# Patient Record
Sex: Female | Born: 1955 | Race: Black or African American | Hispanic: No | Smoking: Current every day smoker
Health system: Southern US, Community
[De-identification: ages and names within clinical notes are randomized; demographics above are authoritative.]

## PROBLEM LIST (undated history)

## (undated) DIAGNOSIS — I10 Essential (primary) hypertension: Secondary | ICD-10-CM

## (undated) DIAGNOSIS — M797 Fibromyalgia: Secondary | ICD-10-CM

## (undated) DIAGNOSIS — J45909 Unspecified asthma, uncomplicated: Secondary | ICD-10-CM

## (undated) DIAGNOSIS — F319 Bipolar disorder, unspecified: Secondary | ICD-10-CM

## (undated) DIAGNOSIS — Z720 Tobacco use: Secondary | ICD-10-CM

## (undated) DIAGNOSIS — F209 Schizophrenia, unspecified: Secondary | ICD-10-CM

## (undated) HISTORY — DX: Bipolar disorder, unspecified: F31.9

## (undated) HISTORY — DX: Unspecified asthma, uncomplicated: J45.909

## (undated) HISTORY — DX: Tobacco use: Z72.0

## (undated) HISTORY — PX: TUBAL LIGATION: SHX77

---

## 1997-07-29 ENCOUNTER — Inpatient Hospital Stay (HOSPITAL_COMMUNITY): Admission: AD | Admit: 1997-07-29 | Discharge: 1997-08-02 | Payer: Self-pay | Admitting: Psychiatry

## 1998-09-11 ENCOUNTER — Ambulatory Visit (HOSPITAL_COMMUNITY): Admission: RE | Admit: 1998-09-11 | Discharge: 1998-09-11 | Payer: Self-pay | Admitting: Obstetrics

## 1998-11-24 ENCOUNTER — Inpatient Hospital Stay (HOSPITAL_COMMUNITY): Admission: AD | Admit: 1998-11-24 | Discharge: 1998-11-24 | Payer: Self-pay | Admitting: *Deleted

## 1998-12-20 ENCOUNTER — Inpatient Hospital Stay (HOSPITAL_COMMUNITY): Admission: AD | Admit: 1998-12-20 | Discharge: 1998-12-24 | Payer: Self-pay | Admitting: *Deleted

## 1999-06-27 ENCOUNTER — Emergency Department (HOSPITAL_COMMUNITY): Admission: EM | Admit: 1999-06-27 | Discharge: 1999-06-27 | Payer: Self-pay | Admitting: Emergency Medicine

## 1999-10-03 ENCOUNTER — Inpatient Hospital Stay (HOSPITAL_COMMUNITY): Admission: AD | Admit: 1999-10-03 | Discharge: 1999-10-06 | Payer: Self-pay

## 2000-03-26 ENCOUNTER — Emergency Department (HOSPITAL_COMMUNITY): Admission: EM | Admit: 2000-03-26 | Discharge: 2000-03-26 | Payer: Self-pay | Admitting: Emergency Medicine

## 2000-04-23 ENCOUNTER — Encounter: Admission: RE | Admit: 2000-04-23 | Discharge: 2000-04-23 | Payer: Self-pay | Admitting: Otolaryngology

## 2000-04-23 ENCOUNTER — Encounter: Payer: Self-pay | Admitting: Otolaryngology

## 2000-10-19 ENCOUNTER — Inpatient Hospital Stay (HOSPITAL_COMMUNITY): Admission: EM | Admit: 2000-10-19 | Discharge: 2000-10-19 | Payer: Self-pay | Admitting: *Deleted

## 2001-08-13 ENCOUNTER — Emergency Department (HOSPITAL_COMMUNITY): Admission: EM | Admit: 2001-08-13 | Discharge: 2001-08-13 | Payer: Self-pay | Admitting: *Deleted

## 2001-09-06 ENCOUNTER — Other Ambulatory Visit: Admission: RE | Admit: 2001-09-06 | Discharge: 2001-09-06 | Payer: Self-pay | Admitting: Family Medicine

## 2001-10-15 ENCOUNTER — Ambulatory Visit (HOSPITAL_COMMUNITY): Admission: RE | Admit: 2001-10-15 | Discharge: 2001-10-15 | Payer: Self-pay | Admitting: Family Medicine

## 2001-10-15 ENCOUNTER — Encounter: Payer: Self-pay | Admitting: Family Medicine

## 2001-11-23 ENCOUNTER — Emergency Department (HOSPITAL_COMMUNITY): Admission: EM | Admit: 2001-11-23 | Discharge: 2001-11-23 | Payer: Self-pay | Admitting: Emergency Medicine

## 2001-11-23 ENCOUNTER — Encounter: Payer: Self-pay | Admitting: Emergency Medicine

## 2004-05-23 ENCOUNTER — Ambulatory Visit: Payer: Self-pay | Admitting: Internal Medicine

## 2005-01-31 ENCOUNTER — Ambulatory Visit: Payer: Self-pay | Admitting: Family Medicine

## 2005-02-04 ENCOUNTER — Ambulatory Visit: Payer: Self-pay | Admitting: Family Medicine

## 2005-02-17 ENCOUNTER — Ambulatory Visit: Payer: Self-pay | Admitting: Family Medicine

## 2005-02-19 ENCOUNTER — Ambulatory Visit (HOSPITAL_COMMUNITY): Admission: RE | Admit: 2005-02-19 | Discharge: 2005-02-19 | Payer: Self-pay | Admitting: Family Medicine

## 2005-05-09 ENCOUNTER — Ambulatory Visit (HOSPITAL_COMMUNITY): Admission: RE | Admit: 2005-05-09 | Discharge: 2005-05-09 | Payer: Self-pay | Admitting: Obstetrics & Gynecology

## 2005-06-19 ENCOUNTER — Emergency Department (HOSPITAL_COMMUNITY): Admission: EM | Admit: 2005-06-19 | Discharge: 2005-06-19 | Payer: Self-pay | Admitting: Emergency Medicine

## 2005-12-08 ENCOUNTER — Ambulatory Visit: Payer: Self-pay | Admitting: Family Medicine

## 2006-03-03 ENCOUNTER — Ambulatory Visit (HOSPITAL_COMMUNITY): Admission: RE | Admit: 2006-03-03 | Discharge: 2006-03-03 | Payer: Self-pay | Admitting: Family Medicine

## 2006-04-06 ENCOUNTER — Encounter: Admission: RE | Admit: 2006-04-06 | Discharge: 2006-04-06 | Payer: Self-pay | Admitting: Family Medicine

## 2006-09-23 ENCOUNTER — Emergency Department (HOSPITAL_COMMUNITY): Admission: EM | Admit: 2006-09-23 | Discharge: 2006-09-23 | Payer: Self-pay | Admitting: Family Medicine

## 2006-11-10 ENCOUNTER — Emergency Department (HOSPITAL_COMMUNITY): Admission: EM | Admit: 2006-11-10 | Discharge: 2006-11-10 | Payer: Self-pay | Admitting: Emergency Medicine

## 2006-12-01 ENCOUNTER — Ambulatory Visit: Payer: Self-pay | Admitting: Internal Medicine

## 2007-01-06 ENCOUNTER — Ambulatory Visit: Payer: Self-pay | Admitting: Internal Medicine

## 2007-01-28 ENCOUNTER — Ambulatory Visit: Payer: Self-pay | Admitting: Internal Medicine

## 2007-03-02 ENCOUNTER — Ambulatory Visit: Payer: Self-pay | Admitting: Internal Medicine

## 2007-03-02 ENCOUNTER — Encounter (INDEPENDENT_AMBULATORY_CARE_PROVIDER_SITE_OTHER): Payer: Self-pay | Admitting: Family Medicine

## 2007-03-02 LAB — CONVERTED CEMR LAB
ALT: 69 units/L — ABNORMAL HIGH (ref 0–35)
AST: 50 units/L — ABNORMAL HIGH (ref 0–37)
Albumin: 3.9 g/dL (ref 3.5–5.2)
Alkaline Phosphatase: 52 units/L (ref 39–117)
BUN: 6 mg/dL (ref 6–23)
CO2: 22 meq/L (ref 19–32)
Calcium: 9.6 mg/dL (ref 8.4–10.5)
Chloride: 103 meq/L (ref 96–112)
Creatinine, Ser: 0.75 mg/dL (ref 0.40–1.20)
Glucose, Bld: 91 mg/dL (ref 70–99)
Lithium Lvl: 0.41 meq/L — ABNORMAL LOW (ref 0.80–1.40)
Potassium: 4.7 meq/L (ref 3.5–5.3)
Sodium: 137 meq/L (ref 135–145)
TSH: 0.409 microintl units/mL (ref 0.350–5.50)
Total Bilirubin: 0.4 mg/dL (ref 0.3–1.2)
Total Protein: 7.7 g/dL (ref 6.0–8.3)

## 2007-03-09 ENCOUNTER — Ambulatory Visit (HOSPITAL_COMMUNITY): Admission: RE | Admit: 2007-03-09 | Discharge: 2007-03-09 | Payer: Self-pay | Admitting: Family Medicine

## 2007-03-15 ENCOUNTER — Ambulatory Visit: Payer: Self-pay | Admitting: Internal Medicine

## 2007-09-24 ENCOUNTER — Emergency Department (HOSPITAL_COMMUNITY): Admission: EM | Admit: 2007-09-24 | Discharge: 2007-09-24 | Payer: Self-pay | Admitting: Emergency Medicine

## 2007-10-14 ENCOUNTER — Ambulatory Visit: Payer: Self-pay | Admitting: Internal Medicine

## 2007-10-15 ENCOUNTER — Encounter: Payer: Self-pay | Admitting: Family Medicine

## 2008-01-09 ENCOUNTER — Inpatient Hospital Stay (HOSPITAL_COMMUNITY): Admission: AD | Admit: 2008-01-09 | Discharge: 2008-01-09 | Payer: Self-pay | Admitting: Obstetrics and Gynecology

## 2008-01-13 ENCOUNTER — Encounter: Payer: Self-pay | Admitting: Family Medicine

## 2008-01-13 LAB — CONVERTED CEMR LAB
ALT: 26 units/L (ref 0–35)
AST: 25 units/L (ref 0–37)
Albumin: 4 g/dL (ref 3.5–5.2)
Alkaline Phosphatase: 58 units/L (ref 39–117)
BUN: 14 mg/dL (ref 6–23)
CO2: 22 meq/L (ref 19–32)
Calcium: 10.5 mg/dL (ref 8.4–10.5)
Chloride: 108 meq/L (ref 96–112)
Cholesterol: 185 mg/dL (ref 0–200)
Creatinine, Ser: 0.97 mg/dL (ref 0.40–1.20)
Glucose, Bld: 81 mg/dL (ref 70–99)
HDL: 52 mg/dL (ref >39)
LDL Cholesterol: 107 mg/dL — ABNORMAL HIGH (ref 0–99)
Lithium Lvl: 0.43 meq/L — ABNORMAL LOW (ref 0.80–1.40)
Potassium: 4.5 meq/L (ref 3.5–5.3)
Sodium: 138 meq/L (ref 135–145)
TSH: 1.043 microintl units/mL (ref 0.350–4.50)
Total Bilirubin: 0.2 mg/dL — ABNORMAL LOW (ref 0.3–1.2)
Total CHOL/HDL Ratio: 3.6
Total Protein: 7.5 g/dL (ref 6.0–8.3)
Triglycerides: 132 mg/dL (ref <150)
VLDL: 26 mg/dL (ref 0–40)

## 2008-01-25 ENCOUNTER — Encounter: Payer: Self-pay | Admitting: Family Medicine

## 2008-01-25 ENCOUNTER — Ambulatory Visit: Payer: Self-pay | Admitting: Internal Medicine

## 2008-01-25 LAB — CONVERTED CEMR LAB
ALT: 23 units/L (ref 0–35)
AST: 23 units/L (ref 0–37)
Albumin: 4.1 g/dL (ref 3.5–5.2)
Alkaline Phosphatase: 54 units/L (ref 39–117)
Amphetamine Screen, Ur: NEGATIVE
BUN: 9 mg/dL (ref 6–23)
Barbiturate Quant, Ur: NEGATIVE
Basophils Absolute: 0 10*3/uL (ref 0.0–0.1)
Basophils Relative: 0 % (ref 0–1)
Benzodiazepines.: NEGATIVE
CO2: 22 meq/L (ref 19–32)
Calcium: 9.7 mg/dL (ref 8.4–10.5)
Chlamydia, Swab/Urine, PCR: NEGATIVE
Chloride: 105 meq/L (ref 96–112)
Cocaine Metabolites: POSITIVE — AB
Creatinine, Ser: 0.93 mg/dL (ref 0.40–1.20)
Creatinine,U: 44.5 mg/dL
Eosinophils Absolute: 0.8 10*3/uL — ABNORMAL HIGH (ref 0.0–0.7)
Eosinophils Relative: 9 % — ABNORMAL HIGH (ref 0–5)
Folate: 10 ng/mL
GC Probe Amp, Urine: NEGATIVE
Glucose, Bld: 85 mg/dL (ref 70–99)
HCT: 33.5 % — ABNORMAL LOW (ref 36.0–46.0)
Hemoglobin: 10 g/dL — ABNORMAL LOW (ref 12.0–15.0)
Lymphocytes Relative: 19 % (ref 12–46)
Lymphs Abs: 1.7 10*3/uL (ref 0.7–4.0)
MCHC: 29.9 g/dL — ABNORMAL LOW (ref 30.0–36.0)
MCV: 79 fL (ref 78.0–100.0)
Marijuana Metabolite: NEGATIVE
Methadone: NEGATIVE
Monocytes Absolute: 0.5 10*3/uL (ref 0.1–1.0)
Monocytes Relative: 6 % (ref 3–12)
Neutro Abs: 5.6 10*3/uL (ref 1.7–7.7)
Neutrophils Relative %: 65 % (ref 43–77)
Opiate Screen, Urine: NEGATIVE
Phencyclidine (PCP): NEGATIVE
Platelets: 418 10*3/uL — ABNORMAL HIGH (ref 150–400)
Potassium: 4.7 meq/L (ref 3.5–5.3)
Propoxyphene: NEGATIVE
RBC: 4.24 M/uL (ref 3.87–5.11)
RDW: 17.3 % — ABNORMAL HIGH (ref 11.5–15.5)
Sodium: 135 meq/L (ref 135–145)
TSH: 1.61 microintl units/mL (ref 0.350–4.50)
Total Bilirubin: 0.3 mg/dL (ref 0.3–1.2)
Total Protein: 8 g/dL (ref 6.0–8.3)
Vit D, 1,25-Dihydroxy: 29 — ABNORMAL LOW (ref 30–89)
Vitamin B-12: 628 pg/mL (ref 211–911)
WBC: 8.7 10*3/uL (ref 4.0–10.5)

## 2008-03-15 ENCOUNTER — Ambulatory Visit: Payer: Self-pay | Admitting: Internal Medicine

## 2008-06-13 ENCOUNTER — Ambulatory Visit: Payer: Self-pay | Admitting: Internal Medicine

## 2008-06-13 ENCOUNTER — Encounter (INDEPENDENT_AMBULATORY_CARE_PROVIDER_SITE_OTHER): Payer: Self-pay | Admitting: Adult Health

## 2008-06-13 LAB — CONVERTED CEMR LAB
ALT: 32 units/L (ref 0–35)
AST: 26 units/L (ref 0–37)
Albumin: 4.3 g/dL (ref 3.5–5.2)
Alkaline Phosphatase: 56 units/L (ref 39–117)
BUN: 12 mg/dL (ref 6–23)
Basophils Absolute: 0 10*3/uL (ref 0.0–0.1)
Basophils Relative: 1 % (ref 0–1)
CO2: 23 meq/L (ref 19–32)
Calcium: 9.4 mg/dL (ref 8.4–10.5)
Chlamydia, DNA Probe: NEGATIVE
Chloride: 105 meq/L (ref 96–112)
Cholesterol: 189 mg/dL (ref 0–200)
Creatinine, Ser: 0.84 mg/dL (ref 0.40–1.20)
Eosinophils Absolute: 0.4 10*3/uL (ref 0.0–0.7)
Eosinophils Relative: 7 % — ABNORMAL HIGH (ref 0–5)
GC Probe Amp, Genital: NEGATIVE
Glucose, Bld: 92 mg/dL (ref 70–99)
HCT: 40.9 % (ref 36.0–46.0)
HDL: 64 mg/dL (ref >39)
Hemoglobin: 13 g/dL (ref 12.0–15.0)
LDL Cholesterol: 94 mg/dL (ref 0–99)
Lymphocytes Relative: 43 % (ref 12–46)
Lymphs Abs: 2.8 10*3/uL (ref 0.7–4.0)
MCHC: 31.8 g/dL (ref 30.0–36.0)
MCV: 69.1 fL — ABNORMAL LOW (ref 78.0–100.0)
Monocytes Absolute: 0.5 10*3/uL (ref 0.1–1.0)
Monocytes Relative: 8 % (ref 3–12)
Neutro Abs: 2.7 10*3/uL (ref 1.7–7.7)
Neutrophils Relative %: 42 % — ABNORMAL LOW (ref 43–77)
Platelets: 386 10*3/uL (ref 150–400)
Potassium: 4.2 meq/L (ref 3.5–5.3)
RBC: 5.92 M/uL — ABNORMAL HIGH (ref 3.87–5.11)
RDW: 17.6 % — ABNORMAL HIGH (ref 11.5–15.5)
Sodium: 139 meq/L (ref 135–145)
Total Bilirubin: 0.3 mg/dL (ref 0.3–1.2)
Total CHOL/HDL Ratio: 3
Total Protein: 8.2 g/dL (ref 6.0–8.3)
Triglycerides: 156 mg/dL — ABNORMAL HIGH (ref <150)
VLDL: 31 mg/dL (ref 0–40)
WBC: 6.4 10*3/uL (ref 4.0–10.5)

## 2008-06-15 ENCOUNTER — Encounter (INDEPENDENT_AMBULATORY_CARE_PROVIDER_SITE_OTHER): Payer: Self-pay | Admitting: Adult Health

## 2008-06-15 LAB — CONVERTED CEMR LAB: Lithium Lvl: 0.15 meq/L — ABNORMAL LOW (ref 0.80–1.40)

## 2009-10-04 ENCOUNTER — Emergency Department (HOSPITAL_COMMUNITY): Admission: EM | Admit: 2009-10-04 | Discharge: 2009-10-04 | Payer: Self-pay | Admitting: Emergency Medicine

## 2009-10-12 ENCOUNTER — Emergency Department (HOSPITAL_COMMUNITY): Admission: EM | Admit: 2009-10-12 | Discharge: 2009-10-12 | Payer: Self-pay | Admitting: Emergency Medicine

## 2009-10-14 ENCOUNTER — Emergency Department (HOSPITAL_COMMUNITY): Admission: EM | Admit: 2009-10-14 | Discharge: 2009-10-14 | Payer: Self-pay | Admitting: Emergency Medicine

## 2009-10-28 ENCOUNTER — Emergency Department (HOSPITAL_COMMUNITY): Admission: EM | Admit: 2009-10-28 | Discharge: 2009-10-28 | Payer: Self-pay | Admitting: Emergency Medicine

## 2010-04-29 ENCOUNTER — Emergency Department (HOSPITAL_COMMUNITY)
Admission: EM | Admit: 2010-04-29 | Discharge: 2010-04-29 | Payer: Self-pay | Source: Home / Self Care | Admitting: Emergency Medicine

## 2010-07-06 ENCOUNTER — Emergency Department (HOSPITAL_COMMUNITY)
Admission: EM | Admit: 2010-07-06 | Discharge: 2010-07-07 | Disposition: A | Discharge status: Other Acute Inpatient Hospital | Payer: Medicaid Other | Source: Home / Self Care | Attending: Emergency Medicine | Admitting: Emergency Medicine

## 2010-07-06 DIAGNOSIS — F3110 Bipolar disorder, current episode manic without psychotic features, unspecified: Secondary | ICD-10-CM | POA: Insufficient documentation

## 2010-07-06 DIAGNOSIS — Z794 Long term (current) use of insulin: Secondary | ICD-10-CM | POA: Insufficient documentation

## 2010-07-06 LAB — URINALYSIS, ROUTINE W REFLEX MICROSCOPIC
Bilirubin Urine: NEGATIVE
Glucose, UA: NEGATIVE mg/dL
Hgb urine dipstick: NEGATIVE
Ketones, ur: NEGATIVE mg/dL
Nitrite: NEGATIVE
Protein, ur: NEGATIVE mg/dL
Specific Gravity, Urine: 1.012 (ref 1.005–1.030)
Urobilinogen, UA: 0.2 mg/dL (ref 0.0–1.0)
pH: 6.5 (ref 5.0–8.0)

## 2010-07-06 LAB — COMPREHENSIVE METABOLIC PANEL
ALT: 55 U/L — ABNORMAL HIGH (ref 0–35)
AST: 39 U/L — ABNORMAL HIGH (ref 0–37)
Albumin: 4.1 g/dL (ref 3.5–5.2)
Alkaline Phosphatase: 61 U/L (ref 39–117)
BUN: 8 mg/dL (ref 6–23)
CO2: 27 mEq/L (ref 19–32)
Calcium: 9.6 mg/dL (ref 8.4–10.5)
Chloride: 105 mEq/L (ref 96–112)
Creatinine, Ser: 0.73 mg/dL (ref 0.4–1.2)
GFR calc Af Amer: >60 mL/min (ref >60)
GFR calc non Af Amer: >60 mL/min (ref >60)
Glucose, Bld: 61 mg/dL — ABNORMAL LOW (ref 70–99)
Potassium: 4 mEq/L (ref 3.5–5.1)
Sodium: 137 mEq/L (ref 135–145)
Total Bilirubin: 0.4 mg/dL (ref 0.3–1.2)
Total Protein: 8.4 g/dL — ABNORMAL HIGH (ref 6.0–8.3)

## 2010-07-06 LAB — RAPID URINE DRUG SCREEN, HOSP PERFORMED
Amphetamines: NOT DETECTED
Barbiturates: NOT DETECTED
Benzodiazepines: NOT DETECTED
Cocaine: NOT DETECTED
Opiates: NOT DETECTED
Tetrahydrocannabinol: NOT DETECTED

## 2010-07-06 LAB — DIFFERENTIAL
Basophils Absolute: 0 10*3/uL (ref 0.0–0.1)
Basophils Relative: 1 % (ref 0–1)
Eosinophils Absolute: 0.2 10*3/uL (ref 0.0–0.7)
Eosinophils Relative: 4 % (ref 0–5)
Lymphocytes Relative: 38 % (ref 12–46)
Lymphs Abs: 2.2 10*3/uL (ref 0.7–4.0)
Monocytes Absolute: 0.4 10*3/uL (ref 0.1–1.0)
Monocytes Relative: 7 % (ref 3–12)
Neutro Abs: 3 10*3/uL (ref 1.7–7.7)
Neutrophils Relative %: 51 % (ref 43–77)

## 2010-07-06 LAB — CBC
HCT: 42.9 % (ref 36.0–46.0)
Hemoglobin: 13.4 g/dL (ref 12.0–15.0)
MCH: 23.6 pg — ABNORMAL LOW (ref 26.0–34.0)
MCHC: 31.2 g/dL (ref 30.0–36.0)
MCV: 75.7 fL — ABNORMAL LOW (ref 78.0–100.0)
Platelets: 393 10*3/uL (ref 150–400)
RBC: 5.67 MIL/uL — ABNORMAL HIGH (ref 3.87–5.11)
RDW: 16 % — ABNORMAL HIGH (ref 11.5–15.5)
WBC: 5.9 10*3/uL (ref 4.0–10.5)

## 2010-07-06 LAB — POCT PREGNANCY, URINE: Preg Test, Ur: NEGATIVE

## 2010-07-06 LAB — LITHIUM LEVEL: Lithium Lvl: 0.59 mEq/L — ABNORMAL LOW (ref 0.80–1.40)

## 2010-07-07 ENCOUNTER — Inpatient Hospital Stay (HOSPITAL_COMMUNITY)
Admission: RE | Admit: 2010-07-07 | Discharge: 2010-07-16 | DRG: 885 | Disposition: A | Discharge status: Home / Self Care | Payer: Medicaid Other | Source: Ambulatory Visit | Attending: Psychiatry | Admitting: Psychiatry

## 2010-07-07 DIAGNOSIS — Z560 Unemployment, unspecified: Secondary | ICD-10-CM

## 2010-07-07 DIAGNOSIS — F1420 Cocaine dependence, uncomplicated: Secondary | ICD-10-CM

## 2010-07-07 DIAGNOSIS — F259 Schizoaffective disorder, unspecified: Principal | ICD-10-CM

## 2010-07-07 DIAGNOSIS — R7402 Elevation of levels of lactic acid dehydrogenase (LDH): Secondary | ICD-10-CM

## 2010-07-07 DIAGNOSIS — Z818 Family history of other mental and behavioral disorders: Secondary | ICD-10-CM

## 2010-07-07 DIAGNOSIS — Z880 Allergy status to penicillin: Secondary | ICD-10-CM

## 2010-07-07 DIAGNOSIS — R7401 Elevation of levels of liver transaminase levels: Secondary | ICD-10-CM

## 2010-07-07 DIAGNOSIS — F312 Bipolar disorder, current episode manic severe with psychotic features: Secondary | ICD-10-CM

## 2010-07-08 DIAGNOSIS — F3110 Bipolar disorder, current episode manic without psychotic features, unspecified: Secondary | ICD-10-CM

## 2010-07-08 DIAGNOSIS — F1420 Cocaine dependence, uncomplicated: Secondary | ICD-10-CM

## 2010-07-08 DIAGNOSIS — F259 Schizoaffective disorder, unspecified: Secondary | ICD-10-CM

## 2010-07-08 LAB — GC/CHLAMYDIA PROBE AMP, GENITAL
Chlamydia, DNA Probe: NEGATIVE
GC Probe Amp, Genital: NEGATIVE

## 2010-07-08 LAB — URINALYSIS, ROUTINE W REFLEX MICROSCOPIC
Bilirubin Urine: NEGATIVE
Glucose, UA: NEGATIVE mg/dL
Hgb urine dipstick: NEGATIVE
Ketones, ur: NEGATIVE mg/dL
Nitrite: NEGATIVE
Protein, ur: NEGATIVE mg/dL
Specific Gravity, Urine: 1.012 (ref 1.005–1.030)
Urobilinogen, UA: 1 mg/dL (ref 0.0–1.0)
pH: 7 (ref 5.0–8.0)

## 2010-07-08 LAB — URINE MICROSCOPIC-ADD ON

## 2010-07-08 LAB — WET PREP, GENITAL
Trich, Wet Prep: NONE SEEN
Yeast Wet Prep HPF POC: NONE SEEN

## 2010-07-09 NOTE — H&P (Signed)
Karen Best, Karen Best             ACCOUNT NO.:  [id number]  MEDICAL RECORD NO.:  [id number]           PATIENT TYPE:  LOCATION:  0406                          FACILITY:  [facility]  PHYSICIAN:  Eulogio Ditch, MD DATE OF BIRTH:  [birth year]  DATE OF ADMISSION:  07/07/2010 DATE OF DISCHARGE:                      PSYCHIATRIC ADMISSION ASSESSMENT   TIME:  1405 p.m.  IDENTIFYING INFORMATION:  A 55 year old [place] American female, single. This is an involuntary admission.  HISTORY OF PRESENT ILLNESS:  This is the second [facility] admission for Karen Best who presents by way of the emergency room where her friend brought her  because of increasing motor activity for the previous 3 weeks.  She had complete dental extraction about 3 weeks prior to admission.  Since that time, her sleep had been poor.  She had a lot of increased energy, increased motor activity, singing and laughing inappropriately, and generally appearing to be in a manic state.  Today, Karen Best is directable, does appear somewhat internally distracted with decreased concentration, easily directable.  Reports she has a history of cocaine use and has been abstinent for about 2 months now.  She does endorse having a lot of increased energy.  Not sleeping and not eating much.  She does report medication compliance.  PAST PSYCHIATRIC HISTORY:  Previous [facility] admission in 2001.  At that time was agitated and hypomanic with homicidal thoughts.  Stabilized at that time on Zyprexa and lithium.  She has a history of previous admissions to [facility] in 1992 and [facility] in 1999.  She reports a history of mental illness when she was about 76 or 81 years of old.  History of cocaine abuse for the last 15-20 years with episodes of abstinence up to 6 or 8 months at a time.  SOCIAL HISTORY:  This is a single [place] American female who reports that she has been living with a friend for several years.  Is  currently involved at [employer].  Says she has a home that she can return to.  Denies any legal charges.  She is unemployed.  FAMILY HISTORY:  Brother with schizophrenia.  PRIMARY CARE PROVIDER:  The primary care provider and dental provider are unclear.  MEDICAL PROBLEMS: 1. Bipolar disorder versus schizoaffective disorder. 2. History of cocaine abuse. 3. Recent dental extraction.  PAST MEDICAL HISTORY: 1. C. section x1. 2. Recent dental extraction.  She is edentulous.  CURRENT MEDICATIONS:  Obtained at [facility] in [place], [place].  Lithium 300 mg CR one in the morning and two at bedtime.  DRUG ALLERGIES:  PENICILLIN.  PHYSICAL EXAMINATION:  GENERAL:  Physical exam was done in the emergency room.  This is a slight built female in no acute physical distress, 5 feet 3 inches tall, 54 kg. VITAL SIGNS:  Today temperature 98.3, pulse 76, respirations 18, blood pressure 106/72.  LABORATORY DATA:  Lithium level was 0.59.  Urine drug screen negative. Urinalysis unremarkable.  Metabolic panel:  Sodium 137, potassium 4.0, chloride 105, carbon dioxide 27, BUN 8, creatinine 0.73, random glucose 61.  Liver enzymes:  SGOT 39, SGPT 55, alkaline phosphatase 61, total bilirubin 0.4.  CBC:  WBC 5.9, hemoglobin 13.4, hematocrit 42.9, platelets 393,000, MCV is 75.7.  MENTAL STATUS EXAM:  Fully alert female, responds quickly to her name, fairly bright affect.  She is appropriately dressed, well groomed, up and about in the day room.  Quite active in the halls, doing some pacing back and forth to her room, but is easily directable, polite.  Speech is mildly pressured.  Her associations are a little bit loose, but her conversation is appropriate.  Thinking generally goal directed, somewhat tangential at times.  No delusional statements made.  Readily admits that she has a lot of extra energy and has not been sleeping much. Think she may have lost some weight,  but is not clear about how much. Staring off into space from time to time.  Speech is quite dysarthric and difficult to understand, but subjectively she denies any history of EPS and insists that this is because of her recent dental extraction. Oriented to person, place and situation.  AXIS I:  Rule out schizoaffective disorder versus bipolar disorder manic, cocaine abuse and dependence. AXIS II:  Deferred. AXIS III:  Post dental extraction, mildly elevated transaminases of unknown etiology. AXIS IV:  Deferred. AXIS V:  Current is 40, past year not known.  PLAN:  The plan is to involuntarily admit her for stabilization.  We have placed her on nutritional supplements four times daily.  She says that she has a safe home to return to.  We are continuing her lithium at 300 mg q.a.m. and 600 mg nightly.  We will check a lithium level on July 11, 2010.  Risperdal 1 mg p.o. b.i.d.     Young Berry. Lorin Picket, N.P.   ______________________________ Eulogio Ditch, MD    MAS/MEDQ  D:  07/08/2010  T:  07/08/2010  Job:  [id number]  Electronically Signed by Kari Baars N.P. on 07/08/2010 04:34:11 PM Electronically Signed by Eulogio Ditch  on 07/09/2010 05:36:02 AM

## 2010-07-11 LAB — BASIC METABOLIC PANEL
BUN: 12 mg/dL (ref 6–23)
CO2: 27 mEq/L (ref 19–32)
Calcium: 9.5 mg/dL (ref 8.4–10.5)
Chloride: 106 mEq/L (ref 96–112)
Creatinine, Ser: 0.69 mg/dL (ref 0.4–1.2)
GFR calc Af Amer: >60 mL/min (ref >60)
GFR calc non Af Amer: >60 mL/min (ref >60)
Glucose, Bld: 93 mg/dL (ref 70–99)
Potassium: 4.2 mEq/L (ref 3.5–5.1)
Sodium: 137 mEq/L (ref 135–145)

## 2010-07-11 LAB — RPR: RPR Ser Ql: NONREACTIVE

## 2010-07-11 LAB — LITHIUM LEVEL: Lithium Lvl: 0.5 mEq/L — ABNORMAL LOW (ref 0.80–1.40)

## 2010-07-13 LAB — TSH: TSH: 0.856 u[IU]/mL (ref 0.350–4.500)

## 2010-07-13 LAB — BASIC METABOLIC PANEL
BUN: 11 mg/dL (ref 6–23)
CO2: 29 mEq/L (ref 19–32)
Calcium: 9.8 mg/dL (ref 8.4–10.5)
Chloride: 102 mEq/L (ref 96–112)
Creatinine, Ser: 0.79 mg/dL (ref 0.4–1.2)
GFR calc Af Amer: >60 mL/min (ref >60)
GFR calc non Af Amer: >60 mL/min (ref >60)
Glucose, Bld: 109 mg/dL — ABNORMAL HIGH (ref 70–99)
Potassium: 3.6 mEq/L (ref 3.5–5.1)
Sodium: 137 mEq/L (ref 135–145)

## 2010-07-13 LAB — RPR: RPR Ser Ql: NONREACTIVE

## 2010-07-13 LAB — LITHIUM LEVEL: Lithium Lvl: 0.42 mEq/L — ABNORMAL LOW (ref 0.80–1.40)

## 2010-07-15 LAB — URINALYSIS, ROUTINE W REFLEX MICROSCOPIC
Bilirubin Urine: NEGATIVE
Glucose, UA: NEGATIVE mg/dL
Hgb urine dipstick: NEGATIVE
Ketones, ur: NEGATIVE mg/dL
Nitrite: NEGATIVE
Protein, ur: NEGATIVE mg/dL
Specific Gravity, Urine: 1.009 (ref 1.005–1.030)
Urobilinogen, UA: 0.2 mg/dL (ref 0.0–1.0)
pH: 7.5 (ref 5.0–8.0)

## 2010-07-15 LAB — DIFFERENTIAL
Basophils Absolute: 0 10*3/uL (ref 0.0–0.1)
Basophils Relative: 1 % (ref 0–1)
Eosinophils Absolute: 0.3 10*3/uL (ref 0.0–0.7)
Eosinophils Relative: 8 % — ABNORMAL HIGH (ref 0–5)
Lymphocytes Relative: 60 % — ABNORMAL HIGH (ref 12–46)
Lymphs Abs: 2 10*3/uL (ref 0.7–4.0)
Monocytes Absolute: 0.3 10*3/uL (ref 0.1–1.0)
Monocytes Relative: 8 % (ref 3–12)
Neutro Abs: 0.8 10*3/uL — ABNORMAL LOW (ref 1.7–7.7)
Neutrophils Relative %: 23 % — ABNORMAL LOW (ref 43–77)

## 2010-07-15 LAB — POCT I-STAT, CHEM 8
BUN: 7 mg/dL (ref 6–23)
Calcium, Ion: 1.22 mmol/L (ref 1.12–1.32)
Chloride: 106 mEq/L (ref 96–112)
Creatinine, Ser: 1 mg/dL (ref 0.4–1.2)
Glucose, Bld: 76 mg/dL (ref 70–99)
HCT: 44 % (ref 36.0–46.0)
Hemoglobin: 15 g/dL (ref 12.0–15.0)
Potassium: 4.4 mEq/L (ref 3.5–5.1)
Sodium: 141 mEq/L (ref 135–145)
TCO2: 29 mmol/L (ref 0–100)

## 2010-07-15 LAB — GC/CHLAMYDIA PROBE AMP, GENITAL
Chlamydia, DNA Probe: NEGATIVE
GC Probe Amp, Genital: NEGATIVE

## 2010-07-15 LAB — CBC
HCT: 40 % (ref 36.0–46.0)
Hemoglobin: 12.9 g/dL (ref 12.0–15.0)
MCHC: 32.3 g/dL (ref 30.0–36.0)
MCV: 74.3 fL — ABNORMAL LOW (ref 78.0–100.0)
Platelets: 227 10*3/uL (ref 150–400)
RBC: 5.39 MIL/uL — ABNORMAL HIGH (ref 3.87–5.11)
RDW: 14.7 % (ref 11.5–15.5)
WBC: 3.4 10*3/uL — ABNORMAL LOW (ref 4.0–10.5)

## 2010-07-15 LAB — CK: Total CK: 60 U/L (ref 7–177)

## 2010-07-15 LAB — WET PREP, GENITAL
Trich, Wet Prep: NONE SEEN
WBC, Wet Prep HPF POC: NONE SEEN
Yeast Wet Prep HPF POC: NONE SEEN

## 2010-07-15 LAB — URINE MICROSCOPIC-ADD ON

## 2010-07-17 LAB — GC/CHLAMYDIA PROBE AMP, URINE
Chlamydia, Swab/Urine, PCR: NEGATIVE
GC Probe Amp, Urine: NEGATIVE

## 2010-08-13 NOTE — Discharge Summary (Signed)
Karen Best, Karen Best             ACCOUNT NO.:  [id number]  MEDICAL RECORD NO.:  [id number]           PATIENT TYPE:  I  LOCATION:  0405                          FACILITY:  [facility]  PHYSICIAN:  Karen Ditch, MD DATE OF BIRTH:  Jul 26, 1955  DATE OF ADMISSION:  07/07/2010 DATE OF DISCHARGE:  07/16/2010                              DISCHARGE SUMMARY   IDENTIFYING INFORMATION:  This is a 56 year old African American female single.  This is an involuntary admission.  HISTORY OF PRESENT ILLNESS:  This was Karen second Karen Best admission for Karen Best.  Presented by way of our emergency room where her friend brought her because of increased motor activity for Karen previous 3 weeks.  She had recently had a complete dental extraction about 3 weeks prior to admission, and since that time her sleep had been poor, having a lot of increased energy, increased motor activity, singing and laughing inappropriately, and generally appeared to be having an exacerbation of her bipolar disorder with a manic state.  On initial presentation she appeared internally distracted with decreased concentration, poor impulse control, impaired judgment and insight, but was easily redirectable and voicing no acute suicidal thoughts.  She had not been sleeping or eating very much she reported prior to admission.  She had a history of a previous admission in 2001 also with agitation and hypomanic behavior.  She had a history of previous admissions to [facility] in [facility] and [facility] in  1999. She reported a history of mental illness since she was about 54 or 55 years of age.  She had previously abused cocaine for Karen last 15-20 years, but had been abstinent up to 8 months at a time very recently. She is an unemployed single female who had been living with a friend. Medical evaluation and diagnostic studies, her full physical exam was done in Karen emergency room.  She is in a slim-built,  African American female, 54 kg, 5 feet 3 inches tall, admitted in no physical distress. Admitting vital signs:  Temp 98.3, pulse 76, respirations 18, blood pressure 106/72.  Her admitting lithium level was noted to be 0.59. Urine drug screen negative for all substances.  Normal electrolytes. BUN 8, creatinine 0.73, random glucose 61.  Liver enzymes:  SGOT 39, SGPT 55, alkaline phosphatase 6,  and total bilirubin 0.4.  CBC normal with a hemoglobin of 13.4 normal platelets, MCV 75.7.  CHRONIC MEDICAL PROBLEMS: 1. Bipolar disorder. 2. Recent dental extraction, and she was noted to be edentulous.  COURSE OF HOSPITALIZATION:  She was admitted to our acute stabilization and Int[facility], and given an initial diagnosis of schizoaffective disorder versus bipolar disorder manic.  Cocaine dependence in remission.  Her behavior was appropriate while on [facility] Best, and she was gradually assimilated into Karen milieu.  We continued her involuntary petition for safety.  We placed her on nutritional supplements 4 times daily due to her hypomanic state, and continued her lithium at her home dose of 300 mg q.a.m. and 600 mg q.p.m.  We elected to add Risperdal 1 mg p.o. b.i.d.  We continued to work with Karen  Risperdal, and it was gradually titrated to 3 mg p.o. q.h.s. by March 13.  Repeat lithium level performed on March 15 was noted to be 0.50. By Karen 15th Karen Best was continuing to display some labile affect, tangential thinking, and having difficulty modulating Karen volume of her speech.  Her speech was somewhat dysarthric throughout her stay here which we felt was related to her being edentulous.  She had had her teeth just extracted recently, and complained of having a lot of difficulty articulating her words.  However, she was understandable.  By Karen 16th we elected to increase her lithium to 600 mg b.i.d. based on her previous level, and she tolerated this well.  We continued to titrate her  Risperdal to 1 mg q.a.m. and 4 mg q.h.s.  By Karen 19th she appeared significantly less disorganized.  No wandering.  Denied hearing any auditory hallucinations, less guarded, and much more appropriate.  By Karen 20th she was sleeping consistently at least 6 hours every night, fully alert, cooperative with a fairly bright affect.  She had plans to [facility] to [facility] and follow up there, and hope to get a bed at [facility].  She had previously said she had a home to return to, but thought better of returning to her previous friend that she had been living with feeling that it was an unhealthy situation and possibly risked her sobriety.  She was in full contact with reality, and voicing her intent to follow up with post discharge treatment plan.  FOLLOW-UP PLAN:  Follow up at [facility] on March 22 at 2:30 p.m.  It is noted that her final lithium level drawn was drawn on March 20 in Karen morning, and was pending at Karen time of discharge.  DISCHARGE MEDICATIONS: 1. Benadryl 50 mg q.h.s. 2. Lithium carbonate 600 mg twice daily with meals at 7 a.m. and 5     p.m. 3. Risperdal 1 mg q.a.m. and 4 mg q.h.s.  DISCHARGE DIAGNOSES: 1. Bipolar disorder manic phase, stabilized.  2.  Cocaine dependence     in remission. Axis III:  1.  Post dental extraction.  2.  Mildly elevated transaminases of unknown etiology. Axis IV:  Significant homelessness issues, stabilized. Axis V:  Current 58.  Past year 87 estimated.  DISCHARGE CONDITION:  Stable.     Karen Best, N.P.   ______________________________ Karen Ditch, MD    MAS/MEDQ  D:  08/12/2010  T:  08/12/2010  Job:  [id number]  Electronically Signed by Karen Best N.P. on 08/12/2010 01:56:06 PM Electronically Signed by Karen Best  on 08/13/2010 06:49:23 PM

## 2010-09-13 NOTE — H&P (Signed)
[facility]  Patient:    Karen Best, Karen Best                      MRN: [id number] Adm. Date:  [id number] Disc. Date: [id number] Attending:  Tobey Bride Dictator:   Johnella Moloney, NP                         History and Physical  IDENTIFYING INFORMATION:  Karen Best is a 55 year old married African-American female admitted October 03, 1999 on a voluntary commitment due to suicidal ideation, homicidal ideation towards the father of her child, and also at the time she was intoxicated with crack cocaine and THC.  HISTORY OF PRESENT ILLNESS:  Patient states that she is in the hospital because she felt like she would be better off in the hospital.  She has been having auditory hallucinations for two months.  The voices have been telling her she should not be seeing another than her husband, and sometimes they tell her to kill herself.  The voices told her to kill herself yesterday and she was trying to figure out a plan and how to do it.  She also is having visual hallucinations.  She sees people and sometimes it is fake and it scares her. She acknowledges being depressed for two months.  Sleep is good, appetite is good.  She has had a weight loss of 40 pounds in nine months, but she just had a daughter nine months ago.  She states that she has been using crack cocaine and THC.  She remains homicidal towards her childs boyfriend.  She states that if she had a gun that she would shoot him, and apparently she just found out he might get out of prison next month and she states that if he does that she will indeed shoot him.  She has had crying spells at times.  PAST PSYCHIATRIC HISTORY:  Patient apparently goes to [facility], sees Harolyn Rutherford.  She has been in therapy since 1992.  She has been hospitalized at [facility] in [facility] for "nerves," hospitalized [facility].  She was there for one month.  She states  she was there for lack of sleep x 16 days.  PAST MEDICAL HISTORY:  Patient goes to [facility] for her primary medical care.  Medical problems include a bladder infection, but she took two or three doses of Septra and her urinalysis has come back within normal limits.  CURRENT MEDICATIONS: 1. Zyprexa 10 mg at h.s. 2. Lithium 300 mg b.i.d. 3. Neurontin 4 mg three at bedtime. 4. Septra b.i.d., however she has not been taking this as prescribed.  DRUG ALLERGIES:  PENICILLIN.  SOCIAL HISTORY:  Patient has been married x 2.  The first marriage she states her husband was sick.  They had no children.  They eventually divorced.  She has been married for the second time for 17 years.  She has a 51 year old daughter from this marriage.  She apparently has been having an affair with b boyfriend since 1999.  Her husband is aware of this.  She states currently the boyfriend is in prison for two months because she has been trying to keep him away from her and she apparently charged him with stealing and he was put in prison.  She feels like trying to get out of that relationship has been a primary stressor for  her.  Her father is living.  She has four brothers and one sister.  Two brothers died, one of an MRI and one was murdered about 17 years ago.  She completed ninth grade.  She is unemployed, on [place]. Husband works in [employer] certified [employer].  She states there is the usual marital stresses.  She seems to minimize the fact that she has a boyfriend who she is having an affair with and also continues to live with her husband.  FAMILY HISTORY:  Her brother is just very schizophrenic.  ALCOHOL DRUG HISTORY:  She states she drinks alcohol occasionally.  She has been using cocaine for two years once a week; however, prior to her boyfriend going in to prison, she was using cocaine every day.  She states she recently started marijuana one month ago.  She has used marijuana, she said, for a total of  four time.  PHYSICAL EXAMINATION:  Please see physical examination done at [facility] emergency room on October 02, 1999.  Her urine drug screen was positive for crack, positive for THC, and positive for tricyclates.  According to her, she last used October 01, 1999.  Her lithium level and thyroid profile are pending.  CURRENT MENTAL STATUS EXAMINATION:  A disheveled African-American female dressed in hospital gown.  She is basically cooperative.  Speech:  She mumbles, it is hard to understand here.  Mood anxious.  Affect inappropriate. Positive for suicidal ideation.  Thought process positive for auditory and visual hallucinations with command hallucinations to kill herself.  Positive for suicidal ideation, positive for homicidal ideation.  Cognitive:  Alert and oriented.  Cognitive functioning appears to be intact.  She has poor judgment, poor impulse control, and poor insight.  CURRENT DIAGNOSIS: Axis I:    1. Psychotic disorder not otherwise specified.            2. THC abuse.            3. Cocaine dependence.            4. Rule out schizophrenia.            5. Rule out schizoaffective disorder. Axis II:   Deferred. Axis III:  None. Axis IV:   Severe, related to family problems and her substance abuse. Axis V:    Current global assessment of function 30, highest in past year 60.  CURRENT TREATMENT PLAN AND RECOMMENDATIONS:  Voluntary admission to [facility] unit.  Our goal will be to maintain safety, check every 15 minutes, and have her contract for safety.  For now, we will continue the Zyprexa 10 mg p.o. h.s., Neurontin 400 mg three p.o. at h.s., lithium 300 mg two b.i.d.  Will contact the [facility] to obtain her current diagnosis, her current medications and her past psychiatric treatment.  We will add Wellbutrin 100 mg XR one p.o. q.d. and she is to go to dual diagnosis group.  TENTATIVE LENGTH OF STAY AND DISCHARGE PLAN:  Three to  five days. DD:  10/03/99 TD:  10/04/99 Job: [id number]

## 2010-09-13 NOTE — Op Note (Signed)
Karen Best, Karen Best NO.:  [id number]   MEDICAL RECORD NO.:  [id number]          PATIENT TYPE:  AMB   LOCATION:  SDC                           FACILITY:  [facility]   PHYSICIAN:  Roseanna Rainbow, M.D.DATE OF BIRTH:  [birth year]   DATE OF PROCEDURE:  05/09/2005  DATE OF DISCHARGE:                                 OPERATIVE REPORT   PREOPERATIVE DIAGNOSIS:  Multiparity, a desires sterilization procedure.   POSTOPERATIVE DIAGNOSIS:  Multiparity, a desires sterilization procedure.   PROCEDURE:  Laparoscopic bilateral tubal ligation using fulguration.   SURGEON:  Roseanna Rainbow, M.D.   ANESTHESIA:  General tracheal.   ESTIMATED BLOOD LOSS:  Minimal.   COMPLICATIONS:  None.   PROCEDURE:  The patient was taken to the operating room.  She was given  general anesthesia.  She was placed in the dorsal lithotomy position and  prepped and draped in the usual sterile fashion.  A bivalve speculum was  placed in the patient's vagina.  The anterior lip of the cervix was grasped  with a single-tooth tenaculum.  A Hulka manipulator was then advanced into  the uterus and secured to the anterior lip of the cervix.  The single-tooth  tenaculum and speculum were then removed.  An infraumbilical skin incision  was then made with the scalpel and carried down to the fascia.  The fascia  was tented up and entered sharply.  The parietal peritoneum was tented up  and entered sharply as well.  Fascial sutures were then placed.  The Hasson  trocar and sleeve were then advanced into the abdomen.  The abdomen was  insufflated with CO2 gas.  A survey of the pelvic anatomy was normal.  The  midisthmic portion of the left fallopian tube was cauterized contiguously.  A 2 cm segment of tube was cauterized.  With each application, the ohmmeter  was noted to go 0.  The right fallopian tube was manipulated in a similar  fashion.  The instruments were removed from the abdomen.  The fascial  incision was reapproximated with 0 Vicryl.  The skin was reapproximated with  3-0 Monocryl and Dermabond.  The Hulka manipulator was removed from the  vagina with minimal bleeding noted from the cervix.  At the close of the  procedure the instrument and pack counts said to be correct x2.  The patient  was taken to the PACU awake and in stable condition.      Roseanna Rainbow, M.D.  Electronically Signed     LAJ/MEDQ  D:  05/09/2005  T:  05/09/2005  Job:  [id number]

## 2010-09-13 NOTE — Discharge Summary (Signed)
[facility]  Patient:    Karen Best, Karen Best                      MRN: [id number] Adm. Date:  [id number] Disc. Date: [id number] Attending:  Esau Grew Dictator:   Johnella Moloney, N.P.                           Discharge Summary  HISTORY OF PRESENT ILLNESS:  Karen Best is a 55 year old married African-American female admitted October 03, 1999 under voluntary commitment due to suicidal ideation, homicidal ideation towards the father of her child, and also at the time she was intoxicated with crack cocaine and marijuana. Patient states that she is in the hospital because she felt like she was better off in the hospital.  She has been having auditory hallucinations for two months.  The voices have been telling her she should not be seeing another man other than her husband and sometimes they tell her to kill herself.  The voices told her to kill herself yesterday and she was trying to figure out a plan how to do it.  She was also having visual hallucinations.  She sees people and sometimes it is fake and it scares her.  She acknowledged being depressed for two months.  Sleep is good, appetite is good.  She has had a weight loss of 40 pounds in nine months, but she just had her daughter nine months ago.  She states that she has been using crack cocaine and marijuana. She remains homicidal towards her childs father.  She states that if she had a gun that she would shoot him, and apparently she just found out that he might get out of prison next month and she states that if he does, that she will indeed shoot him.  She has been having crying spells at the time.  Patient goes to [facility] and sees Harolyn Rutherford. She has been in treatment since 1992.  She has had a hospitalization at [facility] in 1992 and hospitalized at [facility] in 1999.  She was there for one month.  She goes to [facility] for medical  treatments. Medical problems include bladder infection.  CURRENT MEDICATIONS: 1. Zyprexa 10 mg at h.s. 2. Lithium 300 mg b.i.d. 3. Neurontin 400 mg t.i.d. 4. Septra b.i.d.; however, she has not been taking this as prescribed.  ALLERGIES:  Patient reports that she is allergic to PENICILLIN.  PHYSICAL EXAMINATION:  Please see physical exam done at [facility] Emergency Room on October 02, 1999.  LABORATORY:  Urine drug screen was positive for crack, positive for THC, and positive for tricyclates.  According to her she last used October 01, 1999.  Her Lithium level and thyroid profile were pending and her C-MET was within normal limits with the exception of glucose low at 68.  Her AST was elevated at 45, her ALT was elevated at 44.  Her CBC with differential was within normal limits with the exception of her RBC being high at 5.8, MCV low at 75.1, MCHC low at 3.8, and RDW high at 15.6.  Her urine pregnancy test was negative and her urinalysis was within normal limits.  Her Lithium level was pending as well as her T3, TSH, free T4.  MENTAL STATUS EXAMINATION:  This was a disheveled African-American female dressed in hospital gown, basically cooperative.  She mumbles.  It is hard to  understand her.  Mood is anxious, affect inappropriate, positive for suicidal ideation.  Thought process positive for auditory and visual hallucinations with the command hallucination to kill herself, positive for suicidal ideation, positive for homicidal ideation.  Cognitive:  Alert and oriented. Cognitive functioning appears to be intact.  She has poor judgment and poor impulse control and poor insight.  ADMISSION DIAGNOSES: Axis I:    1. Psychotic disorder, not otherwise specified.            2. Marijuana abuse.            3. Cocaine dependence.            4. Rule out schizophrenia.            5. Rule out schizoaffective disorder. Axis II:   Deferred. Axis III:  None. Axis IV:   Severe, related to family  problems and substance abuse. Axis V:    Current global assessment of functioning is 30, highest in past            year is 60.  HOSPITAL COURSE:  The patient was admitted to the [facility] unit for treatment of her depression and substance abuse.  She was started on Zyprexa 10 mg h.s., Neurontin 4 mg three p.o. q.h.s., Lithium 2 mg p.o. two b.i.d., and we discontinued the Septra because her urinalysis was within normal limits.  Patient did have a cough which was positive for PPD and we did order a chest x-ray stat which was within normal limits.  We also added Wellbutrin 100 mg SR p.o. q.a.m. to help with her cravings and on the last day we increased her Zyprexa to 12.5 at h.s.  On June 9 we decreased her Neurontin to 800 mg h.s., increased Wellbutrin SR to 100 mg b.i.d. and also obtained a Lithium level.  While she was in the hospital she reports improvement, not having auditory hallucinations today.  Mood is brighter.  She denies current thoughts of harming herself or anyone else.  She is eating and sleeping better, tolerating medications well.  Lithium level was 0.34 on admission and may be secondary to poor compliance.  Patient acknowledges substance abuse and wants to quit.  May consider program after hospitalization.  Due to severity of hallucinations and violent content, will continue to monitor for true resolution of hallucinations.  We did increase her Wellbutrin and decreased her Neurontin.  On June 10 she continued saying that she was better now.  She was back to her old self.  "I havent heard those voices in two days.  I feel better.  I got my priorities straight."  She denies any suicidal or homicidal ideation.  She is smiling, says sleeping and eating well.  She is tolerating medications well.  If we decide and family is agreeable that we would consider discharge.  They were so we felt like she was appropriate for discharge and could be safe.  CONDITION ON  DISCHARGE:  Patient is discharged in improved condition with improvement in her mood, sleep, appetite, alleviation of suicidal and homicidal ideation, no current hallucinations.   DISPOSITION:  The patient is discharged to home.  FOLLOW-UP:  The patient is to follow up with mental health center for any problems.  DISCHARGE MEDICATIONS: 1. Neurontin 400 mg two at bedtime. 2. Wellbutrin SR 100 mg one tab a.m. and 4 p.m. 3. Zyprexa 10 mg at bedtime. 4. Zyprexa 2.5 mg one tab at bedtime.  DISCHARGE DIAGNOSES: Axis I:  1. Schizoaffective disorder.            2. Marijuana abuse.            3. Cocaine dependence. Axis II:   Deferred. Axis III:  None. Axis IV:   Severe, related to her use of substances. Axis V:    Current global assessment of functioning is 50, highest in past            year was 60. DD:  10/09/99 TD:  10/13/99 Job: [id number]

## 2010-12-01 ENCOUNTER — Emergency Department (HOSPITAL_COMMUNITY)
Admission: EM | Admit: 2010-12-01 | Discharge: 2010-12-01 | Disposition: A | Discharge status: Home / Self Care | Payer: Medicaid Other | Attending: Emergency Medicine | Admitting: Emergency Medicine

## 2010-12-01 DIAGNOSIS — Z79899 Other long term (current) drug therapy: Secondary | ICD-10-CM | POA: Insufficient documentation

## 2010-12-01 DIAGNOSIS — F319 Bipolar disorder, unspecified: Secondary | ICD-10-CM | POA: Insufficient documentation

## 2010-12-01 DIAGNOSIS — N898 Other specified noninflammatory disorders of vagina: Secondary | ICD-10-CM | POA: Insufficient documentation

## 2010-12-01 DIAGNOSIS — R109 Unspecified abdominal pain: Secondary | ICD-10-CM | POA: Insufficient documentation

## 2010-12-01 LAB — WET PREP, GENITAL
Clue Cells Wet Prep HPF POC: NONE SEEN
Trich, Wet Prep: NONE SEEN
Yeast Wet Prep HPF POC: NONE SEEN

## 2010-12-02 LAB — GC/CHLAMYDIA PROBE AMP, GENITAL
Chlamydia, DNA Probe: NEGATIVE
GC Probe Amp, Genital: NEGATIVE

## 2011-01-07 ENCOUNTER — Emergency Department (HOSPITAL_COMMUNITY): Payer: Medicaid Other

## 2011-01-07 ENCOUNTER — Emergency Department (HOSPITAL_COMMUNITY)
Admission: EM | Admit: 2011-01-07 | Discharge: 2011-01-07 | Disposition: A | Discharge status: Home / Self Care | Payer: Medicaid Other | Attending: Emergency Medicine | Admitting: Emergency Medicine

## 2011-01-07 DIAGNOSIS — F209 Schizophrenia, unspecified: Secondary | ICD-10-CM | POA: Insufficient documentation

## 2011-01-07 DIAGNOSIS — F319 Bipolar disorder, unspecified: Secondary | ICD-10-CM | POA: Insufficient documentation

## 2011-01-07 DIAGNOSIS — R072 Precordial pain: Secondary | ICD-10-CM | POA: Insufficient documentation

## 2011-01-07 LAB — CBC
HCT: 42.8 % (ref 36.0–46.0)
Hemoglobin: 14.1 g/dL (ref 12.0–15.0)
MCH: 24.7 pg — ABNORMAL LOW (ref 26.0–34.0)
MCHC: 32.9 g/dL (ref 30.0–36.0)
MCV: 75.1 fL — ABNORMAL LOW (ref 78.0–100.0)
Platelets: 307 10*3/uL (ref 150–400)
RBC: 5.7 MIL/uL — ABNORMAL HIGH (ref 3.87–5.11)
RDW: 14.8 % (ref 11.5–15.5)
WBC: 7.1 10*3/uL (ref 4.0–10.5)

## 2011-01-07 LAB — DIFFERENTIAL
Basophils Absolute: 0 10*3/uL (ref 0.0–0.1)
Basophils Relative: 0 % (ref 0–1)
Eosinophils Absolute: 1 10*3/uL — ABNORMAL HIGH (ref 0.0–0.7)
Eosinophils Relative: 14 % — ABNORMAL HIGH (ref 0–5)
Lymphocytes Relative: 42 % (ref 12–46)
Lymphs Abs: 3 10*3/uL (ref 0.7–4.0)
Monocytes Absolute: 0.5 10*3/uL (ref 0.1–1.0)
Monocytes Relative: 7 % (ref 3–12)
Neutro Abs: 2.6 10*3/uL (ref 1.7–7.7)
Neutrophils Relative %: 37 % — ABNORMAL LOW (ref 43–77)

## 2011-01-07 LAB — COMPREHENSIVE METABOLIC PANEL
ALT: 32 U/L (ref 0–35)
AST: 30 U/L (ref 0–37)
Albumin: 3.9 g/dL (ref 3.5–5.2)
Alkaline Phosphatase: 69 U/L (ref 39–117)
BUN: 5 mg/dL — ABNORMAL LOW (ref 6–23)
CO2: 27 mEq/L (ref 19–32)
Calcium: 10.5 mg/dL (ref 8.4–10.5)
Chloride: 102 mEq/L (ref 96–112)
Creatinine, Ser: 0.68 mg/dL (ref 0.50–1.10)
GFR calc Af Amer: >60 mL/min (ref >60)
GFR calc non Af Amer: >60 mL/min (ref >60)
Glucose, Bld: 81 mg/dL (ref 70–99)
Potassium: 4.4 mEq/L (ref 3.5–5.1)
Sodium: 136 mEq/L (ref 135–145)
Total Bilirubin: 0.2 mg/dL — ABNORMAL LOW (ref 0.3–1.2)
Total Protein: 8.5 g/dL — ABNORMAL HIGH (ref 6.0–8.3)

## 2011-01-07 LAB — POCT I-STAT TROPONIN I: Troponin i, poc: 0.01 ng/mL (ref 0.00–0.08)

## 2011-01-29 LAB — WET PREP, GENITAL
Clue Cells Wet Prep HPF POC: NONE SEEN
Trich, Wet Prep: NONE SEEN
Yeast Wet Prep HPF POC: NONE SEEN

## 2011-01-29 LAB — CBC
HCT: 36.3
Hemoglobin: 11.7 — ABNORMAL LOW
MCHC: 32.3
MCV: 76.3 — ABNORMAL LOW
Platelets: 326
RBC: 4.76
RDW: 16.5 — ABNORMAL HIGH
WBC: 9.6

## 2011-01-29 LAB — GC/CHLAMYDIA PROBE AMP, GENITAL
Chlamydia, DNA Probe: NEGATIVE
GC Probe Amp, Genital: NEGATIVE

## 2011-01-29 LAB — POCT PREGNANCY, URINE: Preg Test, Ur: NEGATIVE

## 2011-03-14 ENCOUNTER — Emergency Department (HOSPITAL_COMMUNITY): Payer: Medicaid Other

## 2011-03-14 ENCOUNTER — Emergency Department (HOSPITAL_COMMUNITY)
Admission: EM | Admit: 2011-03-14 | Discharge: 2011-03-14 | Disposition: A | Discharge status: Home / Self Care | Payer: Medicaid Other | Attending: Emergency Medicine | Admitting: Emergency Medicine

## 2011-03-14 ENCOUNTER — Other Ambulatory Visit: Payer: Self-pay

## 2011-03-14 DIAGNOSIS — J189 Pneumonia, unspecified organism: Secondary | ICD-10-CM

## 2011-03-14 DIAGNOSIS — R079 Chest pain, unspecified: Secondary | ICD-10-CM | POA: Insufficient documentation

## 2011-03-14 DIAGNOSIS — F17200 Nicotine dependence, unspecified, uncomplicated: Secondary | ICD-10-CM | POA: Insufficient documentation

## 2011-03-14 DIAGNOSIS — M79609 Pain in unspecified limb: Secondary | ICD-10-CM | POA: Insufficient documentation

## 2011-03-14 HISTORY — DX: Fibromyalgia: M79.7

## 2011-03-14 LAB — BASIC METABOLIC PANEL
BUN: 4 mg/dL — ABNORMAL LOW (ref 6–23)
CO2: 27 mEq/L (ref 19–32)
Calcium: 10.3 mg/dL (ref 8.4–10.5)
Chloride: 101 mEq/L (ref 96–112)
Creatinine, Ser: 0.79 mg/dL (ref 0.50–1.10)
GFR calc Af Amer: >90 mL/min (ref >90)
GFR calc non Af Amer: >90 mL/min (ref >90)
Glucose, Bld: 93 mg/dL (ref 70–99)
Potassium: 4.2 mEq/L (ref 3.5–5.1)
Sodium: 132 mEq/L — ABNORMAL LOW (ref 135–145)

## 2011-03-14 LAB — CBC
HCT: 42.1 % (ref 36.0–46.0)
Hemoglobin: 13.5 g/dL (ref 12.0–15.0)
MCH: 25 pg — ABNORMAL LOW (ref 26.0–34.0)
MCHC: 32.1 g/dL (ref 30.0–36.0)
MCV: 77.8 fL — ABNORMAL LOW (ref 78.0–100.0)
Platelets: 234 10*3/uL (ref 150–400)
RBC: 5.41 MIL/uL — ABNORMAL HIGH (ref 3.87–5.11)
RDW: 15 % (ref 11.5–15.5)
WBC: 7.4 10*3/uL (ref 4.0–10.5)

## 2011-03-14 LAB — TROPONIN I: Troponin I: <0.3 ng/mL (ref <0.30)

## 2011-03-14 MED ORDER — DEXTROSE 5 % IV SOLN
500.0000 mg | Freq: Once | INTRAVENOUS | Status: AC
  Administered 2011-03-14: 500 mg via INTRAVENOUS
  Filled 2011-03-14: qty 500

## 2011-03-14 MED ORDER — AZITHROMYCIN 250 MG PO TABS: 250.0000 mg | ORAL_TABLET | Freq: Every day | ORAL | Status: AC

## 2011-03-14 MED ORDER — DEXTROSE 5 % IV SOLN
1.0000 g | Freq: Once | INTRAVENOUS | Status: AC
  Administered 2011-03-14: 1 g via INTRAVENOUS
  Filled 2011-03-14: qty 10

## 2011-03-14 MED ORDER — KETOROLAC TROMETHAMINE 30 MG/ML IJ SOLN
30.0000 mg | Freq: Once | INTRAMUSCULAR | Status: AC
  Administered 2011-03-14: 30 mg via INTRAVENOUS
  Filled 2011-03-14: qty 1

## 2011-03-14 MED ORDER — KETOROLAC TROMETHAMINE 60 MG/2ML IM SOLN: 60.0000 mg | Freq: Once | INTRAMUSCULAR | Status: DC

## 2011-03-14 MED ORDER — ALBUTEROL SULFATE HFA 108 (90 BASE) MCG/ACT IN AERS: 2.0000 | INHALATION_SPRAY | RESPIRATORY_TRACT | Status: DC | PRN

## 2011-03-14 NOTE — ED Provider Notes (Signed)
Medical screening examination/treatment/procedure(s) were performed by non-physician practitioner and as supervising physician I was immediately available for consultation/collaboration.  Reagan Behlke K Linker, MD 03/14/11 1552 

## 2011-03-14 NOTE — ED Provider Notes (Addendum)
History     CSN: [id number] Arrival date & time: 03/14/2011 11:07 AM   First MD Initiated Contact with Patient 03/14/11 1232      Chief Complaint  Patient presents with  . Chest Pain   HPI Patient presents to the emergency room with complaint of chest pain. Reports that it substernally nature. States that it's been constant chest pain for the past month. States that the pain has been unchanged. Reports that the pain does radiate down her left arm from time to time. Denies any history of coronary artery disease. Patient reports the pain is dull or aching in nature. Denies any other complaints at this time.   Past Medical History  Diagnosis Date  . Fibromyalgia     History reviewed. No pertinent past surgical history.  History reviewed. No pertinent family history.  History  Substance Use Topics  . Smoking status: Current Everyday Smoker  . Smokeless tobacco: Not on file  . Alcohol Use: No    OB History    Grav Para Term Preterm Abortions TAB SAB Ect Mult Living                  Review of Systems  Constitutional: Negative for fever, chills, diaphoresis and appetite change.  HENT: Negative for neck pain.   Eyes: Negative for photophobia and visual disturbance.  Respiratory: Positive for cough and shortness of breath. Negative for chest tightness.   Cardiovascular: Positive for chest pain. Negative for palpitations and leg swelling.  Gastrointestinal: Negative for nausea, vomiting and abdominal pain.  Genitourinary: Negative for flank pain.  Musculoskeletal: Negative for back pain.  Skin: Negative for rash.  Neurological: Negative for weakness and numbness.  All other systems reviewed and are negative.    Allergies  Review of patient's allergies indicates no known allergies.  Home Medications   Current Outpatient Rx  Name Route Sig Dispense Refill  . LITHIUM CARBONATE 300 MG PO TABS Oral Take 600 mg by mouth 2 (two) times daily.      Marland Kitchen LURASIDONE HCL 80 MG PO  TABS Oral Take 80 mg by mouth daily with breakfast.      . TRAZODONE HCL 100 MG PO TABS Oral Take 100 mg by mouth at bedtime.        BP 121/67  Pulse 63  Temp(Src) 98 F (36.7 C) (Oral)  Resp 18  SpO2 96%  Physical Exam  Nursing note and vitals reviewed. Constitutional: She is oriented to person, place, and time. She appears well-developed and well-nourished. No distress.  HENT:  Head: Normocephalic and atraumatic.  Eyes: EOM are normal. Pupils are equal, round, and reactive to light.  Neck: Normal range of motion. Neck supple. No JVD present. No tracheal deviation present.  Cardiovascular: Normal rate, regular rhythm, normal heart sounds and intact distal pulses.  Exam reveals no gallop and no friction rub.   No murmur heard. Pulmonary/Chest: Effort normal and breath sounds normal. No stridor. No respiratory distress. She has no wheezes. She has no rales. She exhibits tenderness.       Left sided chest pain to palpation  Abdominal: Soft. Bowel sounds are normal. There is no tenderness. There is no rebound and no guarding.  Musculoskeletal: Normal range of motion. She exhibits no edema and no tenderness.  Lymphadenopathy:    She has no cervical adenopathy.  Neurological: She is alert and oriented to person, place, and time. She displays normal reflexes. No cranial nerve deficit. She exhibits normal muscle tone. Coordination normal.  Skin: Skin is warm and dry. No rash noted. She is not diaphoretic. No pallor.  Psychiatric: She has a normal mood and affect. Her behavior is normal. Judgment and thought content normal.    ED Course  Procedures (including critical care time)  Patient seen and evaluated.  VSS reviewed. . Nursing notes reviewed. Discussed with attending physician. Initial testing ordered. Will monitor the patient closely. They agree with the treatment plan and diagnosis.   Results for orders placed during the hospital encounter of 03/14/11  TROPONIN I      Component  Value Range   Troponin I <0.30  <0.30 (ng/mL)  BASIC METABOLIC PANEL      Component Value Range   Sodium 132 (*) 135 - 145 (mEq/L)   Potassium 4.2  3.5 - 5.1 (mEq/L)   Chloride 101  96 - 112 (mEq/L)   CO2 27  19 - 32 (mEq/L)   Glucose, Bld 93  70 - 99 (mg/dL)   BUN 4 (*) 6 - 23 (mg/dL)   Creatinine, Ser 6.04  0.50 - 1.10 (mg/dL)   Calcium 54.0  8.4 - 10.5 (mg/dL)   GFR calc non Af Amer >90  >90 (mL/min)   GFR calc Af Amer >90  >90 (mL/min)  CBC      Component Value Range   WBC 7.4  4.0 - 10.5 (K/uL)   RBC 5.41 (*) 3.87 - 5.11 (MIL/uL)   Hemoglobin 13.5  12.0 - 15.0 (g/dL)   HCT 98.1  19.1 - 47.8 (%)   MCV 77.8 (*) 78.0 - 100.0 (fL)   MCH 25.0 (*) 26.0 - 34.0 (pg)   MCHC 32.1  30.0 - 36.0 (g/dL)   RDW 29.5  62.1 - 30.8 (%)   Platelets 234  150 - 400 (K/uL)   Dg Chest 2 View  03/14/2011  *RADIOLOGY REPORT*  Clinical Data: Chest pain.  Shortness of breath.  Smoker.  CHEST - 2 VIEW 03/14/2011:  Comparison: Two-view chest x-ray 01/07/2011 [facility].  Findings: Interval development of patchy airspace opacities in the right lower lobe.  Lungs remain clear otherwise.  Cardiomediastinal silhouette unremarkable and unchanged.  No pleural effusions. Visualized bony thorax intact with mild degenerative changes.  IMPRESSION: Bronchopneumonia suspected in the right lower lobe.  Original Report Authenticated By: Arnell Sieving, M.D.   1:57 PM Patient seen and re-evaluated. Resting comfortably. VSS stable. NAD. Patient notified of testing results. Stated agreement and understanding. Patient stated understanding to treatment plan and diagnosis. Pneumonia on RLL, will give the patient rocephin and zithromax to cover CAP.   Date: 03/14/2011, 11:25  Rate: 61  Rhythm: normal sinus rhythm  QRS Axis: normal  Intervals: normal  ST/T Wave abnormalities: normal  Conduction Disutrbances:none  Narrative Interpretation:   Old EKG Reviewed: unchanged, 01/07/11    MDM  Pneumonia          Demetrius Charity, PA 03/14/11 1444  Demetrius Charity, [place] 03/14/11 1521

## 2011-03-14 NOTE — ED Provider Notes (Signed)
Medical screening examination/treatment/procedure(s) were performed by non-physician practitioner and as supervising physician I was immediately available for consultation/collaboration.  Ethelda Chick, MD 03/14/11 [phone]

## 2011-03-14 NOTE — ED Notes (Signed)
Pt here with chest pain for the past month, states that today she can feel it in her arm.  Pt a/o, skin w/d, mae x 4, pt is ambulatory with NAD

## 2011-03-14 NOTE — ED Notes (Signed)
Patient presents with left sided chest pain/pressure x 1 month with pain shooting down left arm today. Patient also reports SOB, denies n/v.

## 2011-04-03 ENCOUNTER — Emergency Department (HOSPITAL_COMMUNITY): Payer: Medicaid Other

## 2011-04-03 ENCOUNTER — Emergency Department (HOSPITAL_COMMUNITY)
Admission: EM | Admit: 2011-04-03 | Discharge: 2011-04-03 | Disposition: A | Discharge status: Home / Self Care | Payer: Medicaid Other | Attending: Emergency Medicine | Admitting: Emergency Medicine

## 2011-04-03 ENCOUNTER — Encounter (HOSPITAL_COMMUNITY): Payer: Self-pay | Admitting: Emergency Medicine

## 2011-04-03 ENCOUNTER — Other Ambulatory Visit: Payer: Self-pay

## 2011-04-03 DIAGNOSIS — K5900 Constipation, unspecified: Secondary | ICD-10-CM | POA: Insufficient documentation

## 2011-04-03 DIAGNOSIS — R059 Cough, unspecified: Secondary | ICD-10-CM | POA: Insufficient documentation

## 2011-04-03 DIAGNOSIS — Z8659 Personal history of other mental and behavioral disorders: Secondary | ICD-10-CM | POA: Insufficient documentation

## 2011-04-03 DIAGNOSIS — J3489 Other specified disorders of nose and nasal sinuses: Secondary | ICD-10-CM | POA: Insufficient documentation

## 2011-04-03 DIAGNOSIS — R05 Cough: Secondary | ICD-10-CM | POA: Insufficient documentation

## 2011-04-03 DIAGNOSIS — Z79899 Other long term (current) drug therapy: Secondary | ICD-10-CM | POA: Insufficient documentation

## 2011-04-03 DIAGNOSIS — J343 Hypertrophy of nasal turbinates: Secondary | ICD-10-CM | POA: Insufficient documentation

## 2011-04-03 DIAGNOSIS — F209 Schizophrenia, unspecified: Secondary | ICD-10-CM | POA: Insufficient documentation

## 2011-04-03 DIAGNOSIS — R079 Chest pain, unspecified: Secondary | ICD-10-CM | POA: Insufficient documentation

## 2011-04-03 HISTORY — DX: Schizophrenia, unspecified: F20.9

## 2011-04-03 LAB — POCT I-STAT TROPONIN I: Troponin i, poc: 0 ng/mL (ref 0.00–0.08)

## 2011-04-03 NOTE — ED Notes (Signed)
Pt c/o mid sternal CP with cough and SOB x 4 days; pt sts recently treated for PNA; pt sts HA and generalized body aches also

## 2011-04-03 NOTE — ED Notes (Signed)
EKG completed in triage by Noreene Larsson, NT. Copies of EKG handed to Dr.Pickering and Magnus Sinning, PA

## 2011-04-03 NOTE — ED Notes (Signed)
Pt reports 9/10 sharp midsternal chest pain onset 4 days ago while doing daily activities.  Pt also complaining of arthritic pain.  Pt denies shortness of breath. Pt denies palpitations.  Pt endorses "a little bit" of nausea.

## 2011-04-03 NOTE — ED Provider Notes (Signed)
History     CSN: [id number] Arrival date & time: 04/03/2011  9:57 AM   First MD Initiated Contact with Patient 04/03/11 1036      Chief Complaint  Patient presents with  . Chest Pain    (Consider location/radiation/quality/duration/timing/severity/associated sxs/prior treatment) HPI Comments: Patient reports that she has had constant substernal chest pain for the past 2 months.  The pain is unchanged.  She has been seen in the Emergency Department two times for the same complaint.  On [id number] she was diagnosed with community acquired pneumonia and was treated with Rocephin and Azithromycin.  She reports that she completed antibiotic therapy.    Patient is a 55 y.o. female presenting with chest pain. The history is provided by the patient.  Chest Pain Duration of episode(s) is 2 months. Chest pain occurs constantly. The chest pain is unchanged. At its most intense, the pain is at 9/10. The quality of the pain is described as sharp. The pain does not radiate. Primary symptoms include cough. Pertinent negatives for primary symptoms include no fever, no syncope, no shortness of breath, no wheezing, no palpitations, no abdominal pain, no nausea, no vomiting and no dizziness.  Pertinent negatives for associated symptoms include no diaphoresis, no lower extremity edema, no near-syncope, no numbness and no weakness.     Past Medical History  Diagnosis Date  . Fibromyalgia   . Schizophrenia     History reviewed. No pertinent past surgical history.  History reviewed. No pertinent family history.  History  Substance Use Topics  . Smoking status: Current Everyday Smoker  . Smokeless tobacco: Not on file  . Alcohol Use: No    OB History    Grav Para Term Preterm Abortions TAB SAB Ect Mult Living                  Review of Systems  Constitutional: Negative for fever, chills and diaphoresis.  HENT: Positive for congestion and sinus pressure. Negative for ear pain, sore throat, neck  pain and neck stiffness.   Respiratory: Positive for cough. Negative for shortness of breath and wheezing.   Cardiovascular: Positive for chest pain. Negative for palpitations, syncope and near-syncope.  Gastrointestinal: Positive for constipation. Negative for nausea, vomiting and abdominal pain.  Genitourinary: Negative for dysuria and hematuria.  Neurological: Negative for dizziness, weakness, light-headedness, numbness and headaches.  Psychiatric/Behavioral: Negative for confusion.    Allergies  Review of patient's allergies indicates no known allergies.  Home Medications   Current Outpatient Rx  Name Route Sig Dispense Refill  . ALBUTEROL SULFATE HFA 108 (90 BASE) MCG/ACT IN AERS Inhalation Inhale 2 puffs into the lungs every 4 (four) hours as needed for wheezing or shortness of breath. 1 Inhaler 3  . LITHIUM CARBONATE 300 MG PO TABS Oral Take 600 mg by mouth 2 (two) times daily.      Marland Kitchen LURASIDONE HCL 80 MG PO TABS Oral Take 80 mg by mouth daily after supper.     . TRAZODONE HCL 100 MG PO TABS Oral Take 200 mg by mouth at bedtime.       BP 118/74  Pulse 62  Temp(Src) 97.8 F (36.6 C) (Oral)  Resp 18  SpO2 100%  Physical Exam  Nursing note and vitals reviewed. Constitutional: She is oriented to person, place, and time. She appears well-developed and well-nourished.  HENT:  Head: Normocephalic and atraumatic.  Right Ear: Tympanic membrane, external ear and ear canal normal.  Left Ear: Tympanic membrane, external ear and ear  canal normal.  Nose: Mucosal edema present. Right sinus exhibits no maxillary sinus tenderness and no frontal sinus tenderness. Left sinus exhibits no maxillary sinus tenderness and no frontal sinus tenderness.  Mouth/Throat: Uvula is midline, oropharynx is clear and moist and mucous membranes are normal. No oropharyngeal exudate.  Eyes: EOM are normal. Pupils are equal, round, and reactive to light.  Neck: Normal range of motion. Neck supple.    Cardiovascular: Normal rate, regular rhythm and normal heart sounds.   Pulmonary/Chest: Effort normal and breath sounds normal. No respiratory distress. She has no wheezes. She has no rales.  Abdominal: Soft. Bowel sounds are normal.  Musculoskeletal: Normal range of motion.  Neurological: She is alert and oriented to person, place, and time.  Skin: Skin is warm and dry. No rash noted.  Psychiatric: She has a normal mood and affect.    ED Course  Procedures (including critical care time)   Labs Reviewed  POCT I-STAT TROPONIN I   Dg Chest 2 View  04/03/2011  *RADIOLOGY REPORT*  Clinical Data: Chest pain, recent pneumonia  CHEST - 2 VIEW  Comparison: 03/14/2011  Findings: Cardiomediastinal silhouette is stable.  No acute infiltrate or pulmonary edema.  Bony thorax is stable. Previous infiltrate in the right lower lobe has cleared.  IMPRESSION: No active disease.  Original Report Authenticated By: Natasha Mead, M.D.     1. Chest pain      Date: 04/03/2011  Rate: 63  Rhythm: normal sinus rhythm  QRS Axis: normal  Intervals: normal  ST/T Wave abnormalities: normal  Conduction Disutrbances:none  Narrative Interpretation:   Old EKG Reviewed: unchanged  Discussed patient with Dr. Deretha Emory who recommended ordering an EKG, CXR, and troponin.  MDM  Pain unchanged over the last 2 months.  Negative troponin.  Normal EKG.  CXR shows that previous infiltrate cleared.  No active disease.  Therefore, feel that patient is stable for discharge.  Encouraged follow up with PCP.  Patient in agreement with the plan.        Pascal Lux [facility] 04/03/11 1443

## 2011-04-03 NOTE — ED Notes (Signed)
Patient transported to and from X-ray 

## 2011-04-03 NOTE — ED Notes (Signed)
Pt pacing around room yelling.  Pt can be heard from other patients's room and at nurse's station.  This RN to bedside to assist pt.  Pt states, "I've been ready for discharge for an hour; I'm ready to go now".  Pt pulling out PIV from [facility] when this RN entered room.  Pt able to be redirected.  Pt instructed that she is not up for discharge at this time.  Pt requesting food and beverage.  Food and beverage provided by [employer], [place].  PIV removed by [place], Jeremy Johann.

## 2011-04-04 NOTE — ED Provider Notes (Signed)
Medical screening examination/treatment/procedure(s) were conducted as a shared visit with non-physician practitioner(s) and myself.  I personally evaluated the patient during the encounter  Patient with constant CP for several days, CXR, Tn and EKG without sig findings. CP not cw cardiac event.     Shelda Jakes, MD 04/04/11 [phone]

## 2011-06-02 ENCOUNTER — Encounter (HOSPITAL_COMMUNITY): Payer: Self-pay

## 2011-06-02 ENCOUNTER — Emergency Department (HOSPITAL_COMMUNITY): Payer: Medicaid Other

## 2011-06-02 ENCOUNTER — Emergency Department (HOSPITAL_COMMUNITY)
Admission: EM | Admit: 2011-06-02 | Discharge: 2011-06-02 | Disposition: A | Discharge status: Home / Self Care | Payer: Medicaid Other | Attending: Emergency Medicine | Admitting: Emergency Medicine

## 2011-06-02 DIAGNOSIS — F17200 Nicotine dependence, unspecified, uncomplicated: Secondary | ICD-10-CM | POA: Insufficient documentation

## 2011-06-02 DIAGNOSIS — IMO0001 Reserved for inherently not codable concepts without codable children: Secondary | ICD-10-CM | POA: Insufficient documentation

## 2011-06-02 DIAGNOSIS — K625 Hemorrhage of anus and rectum: Secondary | ICD-10-CM | POA: Insufficient documentation

## 2011-06-02 DIAGNOSIS — K5900 Constipation, unspecified: Secondary | ICD-10-CM | POA: Insufficient documentation

## 2011-06-02 DIAGNOSIS — F209 Schizophrenia, unspecified: Secondary | ICD-10-CM | POA: Insufficient documentation

## 2011-06-02 LAB — COMPREHENSIVE METABOLIC PANEL
ALT: 37 U/L — ABNORMAL HIGH (ref 0–35)
AST: 28 U/L (ref 0–37)
Albumin: 3.7 g/dL (ref 3.5–5.2)
Alkaline Phosphatase: 72 U/L (ref 39–117)
BUN: 4 mg/dL — ABNORMAL LOW (ref 6–23)
CO2: 26 mEq/L (ref 19–32)
Calcium: 10.6 mg/dL — ABNORMAL HIGH (ref 8.4–10.5)
Chloride: 101 mEq/L (ref 96–112)
Creatinine, Ser: 0.72 mg/dL (ref 0.50–1.10)
GFR calc Af Amer: >90 mL/min (ref >90)
GFR calc non Af Amer: >90 mL/min (ref >90)
Glucose, Bld: 120 mg/dL — ABNORMAL HIGH (ref 70–99)
Potassium: 4 mEq/L (ref 3.5–5.1)
Sodium: 134 mEq/L — ABNORMAL LOW (ref 135–145)
Total Bilirubin: 0.3 mg/dL (ref 0.3–1.2)
Total Protein: 7.8 g/dL (ref 6.0–8.3)

## 2011-06-02 LAB — DIFFERENTIAL
Basophils Absolute: 0 10*3/uL (ref 0.0–0.1)
Basophils Relative: 0 % (ref 0–1)
Eosinophils Absolute: 0.8 10*3/uL — ABNORMAL HIGH (ref 0.0–0.7)
Eosinophils Relative: 10 % — ABNORMAL HIGH (ref 0–5)
Lymphocytes Relative: 36 % (ref 12–46)
Lymphs Abs: 2.7 10*3/uL (ref 0.7–4.0)
Monocytes Absolute: 0.4 10*3/uL (ref 0.1–1.0)
Monocytes Relative: 6 % (ref 3–12)
Neutro Abs: 3.6 10*3/uL (ref 1.7–7.7)
Neutrophils Relative %: 48 % (ref 43–77)

## 2011-06-02 LAB — CBC
HCT: 42.5 % (ref 36.0–46.0)
Hemoglobin: 13.4 g/dL (ref 12.0–15.0)
MCH: 23.9 pg — ABNORMAL LOW (ref 26.0–34.0)
MCHC: 31.5 g/dL (ref 30.0–36.0)
MCV: 75.9 fL — ABNORMAL LOW (ref 78.0–100.0)
Platelets: 235 10*3/uL (ref 150–400)
RBC: 5.6 MIL/uL — ABNORMAL HIGH (ref 3.87–5.11)
RDW: 14.1 % (ref 11.5–15.5)
WBC: 7.5 10*3/uL (ref 4.0–10.5)

## 2011-06-02 LAB — OCCULT BLOOD, POC DEVICE: Fecal Occult Bld: POSITIVE

## 2011-06-02 MED ORDER — MAGNESIUM CITRATE PO SOLN: 296.0000 mL | Freq: Once | ORAL | Status: AC

## 2011-06-02 NOTE — ED Provider Notes (Signed)
History     CSN: [id number]  Arrival date & time 06/02/11  1035   First MD Initiated Contact with Patient 06/02/11 1214      Chief Complaint  Patient presents with  . Constipation    (Consider location/radiation/quality/duration/timing/severity/associated sxs/prior treatment) HPI Comments: Patient states no bm for one month.  Has tried multiple laxatives without relief.  Tried this am to go but was "all blood".  Patient is a 56 y.o. female presenting with constipation. The history is provided by the patient.  Constipation  Episode onset: one month ago. The problem occurs continuously. The problem has been gradually worsening. The pain is moderate. The stool is described as bloody. There was no prior successful therapy. There was no prior unsuccessful therapy. Pertinent negatives include no anorexia, no fever, no nausea and no rectal pain.    Past Medical History  Diagnosis Date  . Fibromyalgia   . Schizophrenia     History reviewed. No pertinent past surgical history.  History reviewed. No pertinent family history.  History  Substance Use Topics  . Smoking status: Current Everyday Smoker  . Smokeless tobacco: Not on file  . Alcohol Use: No    OB History    Grav Para Term Preterm Abortions TAB SAB Ect Mult Living                  Review of Systems  Constitutional: Negative for fever.  Gastrointestinal: Positive for constipation. Negative for nausea, rectal pain and anorexia.  All other systems reviewed and are negative.    Allergies  Review of patient's allergies indicates no known allergies.  Home Medications   Current Outpatient Rx  Name Route Sig Dispense Refill  . ALBUTEROL SULFATE HFA 108 (90 BASE) MCG/ACT IN AERS Inhalation Inhale 2 puffs into the lungs every 4 (four) hours as needed for wheezing or shortness of breath. 1 Inhaler 3  . LITHIUM CARBONATE 300 MG PO TABS Oral Take 600 mg by mouth 2 (two) times daily.      Marland Kitchen LURASIDONE HCL 80 MG PO TABS  Oral Take 80 mg by mouth daily after supper.     . TRAZODONE HCL 100 MG PO TABS Oral Take 200 mg by mouth at bedtime.       BP 128/73  Pulse 67  Temp(Src) 98.3 F (36.8 C) (Oral)  Resp 16  SpO2 99%  Physical Exam  Nursing note and vitals reviewed. Constitutional: She is oriented to person, place, and time. She appears well-developed and well-nourished. No distress.  HENT:  Head: Normocephalic and atraumatic.  Neck: Normal range of motion. Neck supple.  Cardiovascular: Normal rate and regular rhythm.  Exam reveals no gallop and no friction rub.   No murmur heard. Pulmonary/Chest: Effort normal and breath sounds normal. No respiratory distress. She has no wheezes.  Abdominal: Soft. Bowel sounds are normal. She exhibits no distension. There is no tenderness.  Musculoskeletal: Normal range of motion.  Neurological: She is alert and oriented to person, place, and time.  Skin: Skin is warm and dry. She is not diaphoretic.    ED Course  Procedures (including critical care time)   Labs Reviewed  CBC  DIFFERENTIAL  COMPREHENSIVE METABOLIC PANEL   No results found.   No diagnosis found.    MDM  The labs look okay.  The dre shows some stool in the rectum but no fecal impaction.  There is bright red blood present, possibly due to internal hemorrhoids.  She needs to have this followed  up by her pcp.  Will treat with miralax, follow up prn.        Geoffery Lyons, MD 06/02/11 1345

## 2011-06-02 NOTE — ED Notes (Signed)
Pt complians of constipation for 1 month, strained today and still has not had a bowel movement.

## 2011-06-02 NOTE — ED Notes (Signed)
Pt alert and oriented at discharge. NAD. Ambulated to restroom without difficulty. Pt told to follow up with PCP for rectal bleeding

## 2011-11-14 ENCOUNTER — Emergency Department (HOSPITAL_COMMUNITY)
Admission: EM | Admit: 2011-11-14 | Discharge: 2011-11-15 | Disposition: A | Discharge status: Home / Self Care | Payer: Medicaid Other | Attending: Emergency Medicine | Admitting: Emergency Medicine

## 2011-11-14 ENCOUNTER — Encounter (HOSPITAL_COMMUNITY): Payer: Self-pay | Admitting: *Deleted

## 2011-11-14 DIAGNOSIS — N390 Urinary tract infection, site not specified: Secondary | ICD-10-CM

## 2011-11-14 DIAGNOSIS — A5903 Trichomonal cystitis and urethritis: Secondary | ICD-10-CM

## 2011-11-14 DIAGNOSIS — IMO0001 Reserved for inherently not codable concepts without codable children: Secondary | ICD-10-CM | POA: Insufficient documentation

## 2011-11-14 DIAGNOSIS — F209 Schizophrenia, unspecified: Secondary | ICD-10-CM | POA: Insufficient documentation

## 2011-11-14 DIAGNOSIS — F17200 Nicotine dependence, unspecified, uncomplicated: Secondary | ICD-10-CM | POA: Insufficient documentation

## 2011-11-14 DIAGNOSIS — A5909 Other urogenital trichomoniasis: Secondary | ICD-10-CM | POA: Insufficient documentation

## 2011-11-14 LAB — URINALYSIS, ROUTINE W REFLEX MICROSCOPIC
Bilirubin Urine: NEGATIVE
Glucose, UA: NEGATIVE mg/dL
Ketones, ur: NEGATIVE mg/dL
Nitrite: NEGATIVE
Protein, ur: 100 mg/dL — AB
Specific Gravity, Urine: 1.011 (ref 1.005–1.030)
Urobilinogen, UA: 1 mg/dL (ref 0.0–1.0)
pH: 7 (ref 5.0–8.0)

## 2011-11-14 LAB — URINE MICROSCOPIC-ADD ON

## 2011-11-14 LAB — COMPREHENSIVE METABOLIC PANEL
ALT: 27 U/L (ref 0–35)
AST: 26 U/L (ref 0–37)
Albumin: 3.9 g/dL (ref 3.5–5.2)
Alkaline Phosphatase: 77 U/L (ref 39–117)
BUN: 5 mg/dL — ABNORMAL LOW (ref 6–23)
CO2: 23 mEq/L (ref 19–32)
Calcium: 10 mg/dL (ref 8.4–10.5)
Chloride: 103 mEq/L (ref 96–112)
Creatinine, Ser: 0.65 mg/dL (ref 0.50–1.10)
GFR calc Af Amer: >90 mL/min (ref >90)
GFR calc non Af Amer: >90 mL/min (ref >90)
Glucose, Bld: 106 mg/dL — ABNORMAL HIGH (ref 70–99)
Potassium: 3.5 mEq/L (ref 3.5–5.1)
Sodium: 138 mEq/L (ref 135–145)
Total Bilirubin: 0.3 mg/dL (ref 0.3–1.2)
Total Protein: 8 g/dL (ref 6.0–8.3)

## 2011-11-14 LAB — CBC WITH DIFFERENTIAL/PLATELET
Basophils Absolute: 0 10*3/uL (ref 0.0–0.1)
Basophils Relative: 0 % (ref 0–1)
Eosinophils Absolute: 1.6 10*3/uL — ABNORMAL HIGH (ref 0.0–0.7)
Eosinophils Relative: 17 % — ABNORMAL HIGH (ref 0–5)
HCT: 40.9 % (ref 36.0–46.0)
Hemoglobin: 13.8 g/dL (ref 12.0–15.0)
Lymphocytes Relative: 40 % (ref 12–46)
Lymphs Abs: 3.8 10*3/uL (ref 0.7–4.0)
MCH: 25.2 pg — ABNORMAL LOW (ref 26.0–34.0)
MCHC: 33.7 g/dL (ref 30.0–36.0)
MCV: 74.6 fL — ABNORMAL LOW (ref 78.0–100.0)
Monocytes Absolute: 0.5 10*3/uL (ref 0.1–1.0)
Monocytes Relative: 5 % (ref 3–12)
Neutro Abs: 3.6 10*3/uL (ref 1.7–7.7)
Neutrophils Relative %: 38 % — ABNORMAL LOW (ref 43–77)
Platelets: 299 10*3/uL (ref 150–400)
RBC: 5.48 MIL/uL — ABNORMAL HIGH (ref 3.87–5.11)
RDW: 15.7 % — ABNORMAL HIGH (ref 11.5–15.5)
WBC: 9.4 10*3/uL (ref 4.0–10.5)

## 2011-11-14 NOTE — ED Notes (Signed)
The pt has itching in her vagina and her rectum for 12 days.  [facility] is [facility]

## 2011-11-15 LAB — WET PREP, GENITAL
Clue Cells Wet Prep HPF POC: NONE SEEN
Yeast Wet Prep HPF POC: NONE SEEN

## 2011-11-15 MED ORDER — LITHIUM CARBONATE 150 MG PO CAPS
150.0000 mg | ORAL_CAPSULE | Freq: Once | ORAL | Status: AC
  Administered 2011-11-15: 150 mg via ORAL
  Filled 2011-11-15: qty 1

## 2011-11-15 MED ORDER — ONDANSETRON 4 MG PO TBDP
8.0000 mg | ORAL_TABLET | Freq: Once | ORAL | Status: AC
  Administered 2011-11-15: 8 mg via ORAL
  Filled 2011-11-15: qty 2

## 2011-11-15 MED ORDER — CEPHALEXIN 500 MG PO CAPS: 500.0000 mg | ORAL_CAPSULE | Freq: Four times a day (QID) | ORAL | Status: AC

## 2011-11-15 MED ORDER — METRONIDAZOLE 500 MG PO TABS
2000.0000 mg | ORAL_TABLET | Freq: Once | ORAL | Status: AC
  Administered 2011-11-15: 2000 mg via ORAL
  Filled 2011-11-15: qty 4

## 2011-11-15 NOTE — ED Notes (Signed)
Alert, NAD, calm, interactive, skin W&D, resps e/u, speaking in clear complete sentences, c/o rectal pain and vaginal itching, also mentions hemorroids, rectal bleeding and constipation. (denies: nvd, fever, abd or back pain), last BM yesterday (difficult), last ate PTA.

## 2011-11-15 NOTE — ED Provider Notes (Signed)
History     CSN: [id number]  Arrival date & time 11/14/11  1749   First MD Initiated Contact with Patient 11/14/11 2303      Chief Complaint  Patient presents with  . Vaginal Itching     Patient is a 56 y.o. female presenting with vaginal itching. The history is provided by the patient.  Vaginal Itching This is a recurrent problem. The current episode started more than 1 week ago. The problem occurs constantly. The problem has been gradually worsening. Pertinent negatives include no chest pain and no abdominal pain. Nothing aggravates the symptoms. Nothing relieves the symptoms. She has tried nothing for the symptoms.  pt presents for two complaints - rectal itching and some blood in stool Also reports vaginal itching, but no vaginal bleeding/discharge. She reports she no longer has periods/menstrual cycle No vomiting No abd pain She reports she has hemorrhoids   Past Medical History  Diagnosis Date  . Fibromyalgia   . Schizophrenia     History reviewed. No pertinent past surgical history.  No family history on file.  History  Substance Use Topics  . Smoking status: Current Everyday Smoker  . Smokeless tobacco: Not on file  . Alcohol Use: No    OB History    Grav Para Term Preterm Abortions TAB SAB Ect Mult Living                  Review of Systems  Constitutional: Negative for fever.  Cardiovascular: Negative for chest pain.  Gastrointestinal: Negative for abdominal pain.  All other systems reviewed and are negative.    Allergies  Review of patient's allergies indicates no known allergies.  Home Medications   Current Outpatient Rx  Name Route Sig Dispense Refill  . ALBUTEROL SULFATE HFA 108 (90 BASE) MCG/ACT IN AERS Inhalation Inhale 2 puffs into the lungs every 4 (four) hours as needed for wheezing or shortness of breath. 1 Inhaler 3  . LITHIUM CARBONATE 300 MG PO TABS Oral Take 600 mg by mouth 2 (two) times daily.      Marland Kitchen LURASIDONE HCL 80 MG PO TABS  Oral Take 80 mg by mouth daily after supper.     . SENNA 8.6 MG PO TABS Oral Take 1 tablet by mouth daily.    . TRAZODONE HCL 100 MG PO TABS Oral Take 200 mg by mouth at bedtime.     . CEPHALEXIN 500 MG PO CAPS Oral Take 1 capsule (500 mg total) by mouth 4 (four) times daily. 28 capsule 0    BP 114/73  Pulse 66  Temp 97.9 F (36.6 C) (Oral)  Resp 18  SpO2 98%  Physical Exam CONSTITUTIONAL: Well developed/well nourished HEAD AND FACE: Normocephalic/atraumatic EYES: EOMI/PERRL ENMT: Mucous membranes moist NECK: supple no meningeal signs SPINE:entire spine nontender CV: S1/S2 noted, no murmurs/rubs/gallops noted LUNGS: Lungs are clear to auscultation bilaterally, no apparent distress ABDOMEN: soft, nontender, no rebound or guarding GU:no cva tenderness, small amt of discharge, no cmt, and no vag bleeding - chaperone present Rectal - stool color normal, no melena noted.  No mass.  No external hemorrhoids.  No abscess noted NEURO: Pt is awake/alert, moves all extremitiesx4 EXTREMITIES: pulses normal, full ROM SKIN: warm, color normal PSYCH: no abnormalities of mood noted  ED Course  Procedures  Labs Reviewed  URINALYSIS, ROUTINE W REFLEX MICROSCOPIC - Abnormal; Notable for the following:    APPearance CLOUDY (*)     Hgb urine dipstick TRACE (*)     Protein,  ur 100 (*)     Leukocytes, UA LARGE (*)     All other components within normal limits  CBC WITH DIFFERENTIAL - Abnormal; Notable for the following:    RBC 5.48 (*)     MCV 74.6 (*)     MCH 25.2 (*)     RDW 15.7 (*)     Neutrophils Relative 38 (*)     Eosinophils Relative 17 (*)     Eosinophils Absolute 1.6 (*)     All other components within normal limits  COMPREHENSIVE METABOLIC PANEL - Abnormal; Notable for the following:    Glucose, Bld 106 (*)     BUN 5 (*)     All other components within normal limits  URINE MICROSCOPIC-ADD ON - Abnormal; Notable for the following:    Squamous Epithelial / LPF MANY (*)      Bacteria, UA MANY (*)     All other components within normal limits  WET PREP, GENITAL - Abnormal; Notable for the following:    Trich, Wet Prep FEW (*)     WBC, Wet Prep HPF POC FEW (*)     All other components within normal limits  GC/CHLAMYDIA PROBE AMP, GENITAL    1. UTI (lower urinary tract infection)   2. Trichomonal cystitis      Pt well appearing no signs of rectal bleeding/GI bleed at this time uti and trich noted and she was treated for this Advised to avoid sexual activity Advised to f/u with surgery as she may have some internal hemorrhoids but no large external ones noted She requests her evening dose of lithium here   MDM  Nursing notes including past medical history and social history reviewed and considered in documentation All labs/vitals reviewed and considered         Joya Gaskins, MD 11/15/11 0130

## 2011-11-17 LAB — GC/CHLAMYDIA PROBE AMP, GENITAL
Chlamydia, DNA Probe: NEGATIVE
GC Probe Amp, Genital: NEGATIVE

## 2011-12-31 ENCOUNTER — Emergency Department (INDEPENDENT_AMBULATORY_CARE_PROVIDER_SITE_OTHER)
Admission: EM | Admit: 2011-12-31 | Discharge: 2011-12-31 | Disposition: A | Discharge status: Home / Self Care | Payer: Medicaid Other | Source: Home / Self Care | Attending: Emergency Medicine | Admitting: Emergency Medicine

## 2011-12-31 ENCOUNTER — Emergency Department (INDEPENDENT_AMBULATORY_CARE_PROVIDER_SITE_OTHER): Payer: Medicaid Other

## 2011-12-31 ENCOUNTER — Encounter (HOSPITAL_COMMUNITY): Payer: Self-pay | Admitting: Emergency Medicine

## 2011-12-31 DIAGNOSIS — R0602 Shortness of breath: Secondary | ICD-10-CM

## 2011-12-31 DIAGNOSIS — R079 Chest pain, unspecified: Secondary | ICD-10-CM

## 2011-12-31 MED ORDER — ALBUTEROL SULFATE HFA 108 (90 BASE) MCG/ACT IN AERS: 2.0000 | INHALATION_SPRAY | RESPIRATORY_TRACT | Status: DC | PRN

## 2011-12-31 NOTE — ED Provider Notes (Addendum)
History     CSN: [id number]  Arrival date & time 12/31/11  0941   First MD Initiated Contact with Patient 12/31/11 [phone]      Chief Complaint  Patient presents with  . Chest Pain    (Consider location/radiation/quality/duration/timing/severity/associated sxs/prior treatment) HPI Comments: Patient presents to urgent care complaining of chest pains for about one to 2 weeks that exacerbates with deep breathing and occasional cough. Patient denies any fevers or congestion does describe sometimes feel short of breath that she has not had her albuterol inhaler as she ran out of it. Patient also describes she's been smoking about a pack per day or less. She denies any other symptoms such as nausea, vomiting, abdominal pains.    Patient is a 56 y.o. female presenting with chest pain. The history is provided by the patient.  Chest Pain The chest pain began 1 - 2 weeks ago. Chest pain occurs constantly. The chest pain is unchanged. The pain is associated with breathing and coughing. At its most intense, the pain is at 5/10. The severity of the pain is moderate. The quality of the pain is described as aching. The pain radiates to the right arm and left arm. Primary symptoms include shortness of breath and cough. Pertinent negatives for primary symptoms include no fever, no fatigue, no syncope, no wheezing, no palpitations, no abdominal pain, no nausea, no vomiting, no dizziness and no altered mental status.  Pertinent negatives for associated symptoms include no diaphoresis, no numbness and no weakness.  Pertinent negatives for past medical history include no seizures.     Past Medical History  Diagnosis Date  . Fibromyalgia   . Schizophrenia     History reviewed. No pertinent past surgical history.  History reviewed. No pertinent family history.  History  Substance Use Topics  . Smoking status: Current Everyday Smoker  . Smokeless tobacco: Not on file  . Alcohol Use: No    OB History     Grav Para Term Preterm Abortions TAB SAB Ect Mult Living                  Review of Systems  Constitutional: Positive for activity change. Negative for fever, diaphoresis and fatigue.  HENT: Negative for neck pain.   Respiratory: Positive for cough and shortness of breath. Negative for wheezing and stridor.   Cardiovascular: Positive for chest pain. Negative for palpitations, leg swelling and syncope.  Gastrointestinal: Negative for nausea, vomiting and abdominal pain.  Neurological: Negative for dizziness, seizures, weakness, numbness and headaches.  Psychiatric/Behavioral: Negative for altered mental status.    Allergies  Review of patient's allergies indicates no known allergies.  Home Medications   Current Outpatient Rx  Name Route Sig Dispense Refill  . ARIPIPRAZOLE 15 MG PO TABS Oral Take 15 mg by mouth daily.    . ALBUTEROL SULFATE HFA 108 (90 BASE) MCG/ACT IN AERS Inhalation Inhale 2 puffs into the lungs every 4 (four) hours as needed for wheezing or shortness of breath. 1 Inhaler 3  . LITHIUM CARBONATE 300 MG PO TABS Oral Take 600 mg by mouth 2 (two) times daily.      Marland Kitchen LURASIDONE HCL 80 MG PO TABS Oral Take 80 mg by mouth daily after supper.     . SENNA 8.6 MG PO TABS Oral Take 1 tablet by mouth daily.      BP 130/84  Pulse 74  Temp 98.3 F (36.8 C) (Oral)  Resp 24  SpO2 99%  Physical Exam  Nursing note and vitals reviewed. Constitutional: Vital signs are normal. She appears well-developed and well-nourished.  Non-toxic appearance. She does not have a sickly appearance. She does not appear ill. No distress.  Eyes: Conjunctivae are normal.  Neck: Neck supple. No JVD present.  Cardiovascular: Normal rate, regular rhythm, normal heart sounds and normal pulses.   Pulmonary/Chest: Effort normal and breath sounds normal. No respiratory distress. She has no decreased breath sounds. She has no wheezes. She has no rhonchi. She has no rales. She exhibits no tenderness.    Musculoskeletal: Normal range of motion.  Neurological: She is alert.  Skin: No erythema.    ED Course  Procedures (including critical care time)  Labs Reviewed - No data to display No results found.   No diagnosis found. EKG with a normal sinus rhythm, ventricular rate of 74 beats per minute. No ST or T. abnormalities. No PR or QRS interval abnormalities. EKG compared to EKG from December 2012 with no changes.  MDM   Atypical chest pain- EKG was unremarkable. We are performing an x-ray to rule out respiratory causes in this patient is a significant and heavy smoker. Patient is schizophrenic and not a detail historian. Risk factors for heart disease are significant   Jimmie Molly, MD 12/31/11 1036  Jimmie Molly, MD 12/31/11 1037

## 2011-12-31 NOTE — ED Notes (Signed)
Pt having chest pain for one week that has pressure, pain, variable pain crawling up both arms. No c/o cough congestion. C/o SOB, needs a refill of inhaler.

## 2012-04-01 ENCOUNTER — Encounter (HOSPITAL_COMMUNITY): Payer: Self-pay | Admitting: *Deleted

## 2012-04-01 ENCOUNTER — Emergency Department (HOSPITAL_COMMUNITY)
Admission: EM | Admit: 2012-04-01 | Discharge: 2012-04-01 | Disposition: A | Discharge status: Home / Self Care | Payer: Medicaid Other | Attending: Emergency Medicine | Admitting: Emergency Medicine

## 2012-04-01 DIAGNOSIS — R05 Cough: Secondary | ICD-10-CM

## 2012-04-01 DIAGNOSIS — F209 Schizophrenia, unspecified: Secondary | ICD-10-CM | POA: Insufficient documentation

## 2012-04-01 DIAGNOSIS — J45909 Unspecified asthma, uncomplicated: Secondary | ICD-10-CM | POA: Insufficient documentation

## 2012-04-01 DIAGNOSIS — F17200 Nicotine dependence, unspecified, uncomplicated: Secondary | ICD-10-CM | POA: Insufficient documentation

## 2012-04-01 DIAGNOSIS — J029 Acute pharyngitis, unspecified: Secondary | ICD-10-CM | POA: Insufficient documentation

## 2012-04-01 DIAGNOSIS — H9120 Sudden idiopathic hearing loss, unspecified ear: Secondary | ICD-10-CM | POA: Insufficient documentation

## 2012-04-01 DIAGNOSIS — IMO0001 Reserved for inherently not codable concepts without codable children: Secondary | ICD-10-CM | POA: Insufficient documentation

## 2012-04-01 DIAGNOSIS — Z79899 Other long term (current) drug therapy: Secondary | ICD-10-CM | POA: Insufficient documentation

## 2012-04-01 DIAGNOSIS — R079 Chest pain, unspecified: Secondary | ICD-10-CM | POA: Insufficient documentation

## 2012-04-01 DIAGNOSIS — H6690 Otitis media, unspecified, unspecified ear: Secondary | ICD-10-CM

## 2012-04-01 DIAGNOSIS — R059 Cough, unspecified: Secondary | ICD-10-CM

## 2012-04-01 MED ORDER — ACETAMINOPHEN-CODEINE #3 300-30 MG PO TABS
1.0000 | ORAL_TABLET | Freq: Once | ORAL | Status: AC
  Administered 2012-04-01: 1 via ORAL
  Filled 2012-04-01: qty 1

## 2012-04-01 MED ORDER — ACETAMINOPHEN-CODEINE #3 300-30 MG PO TABS: 1.0000 | ORAL_TABLET | Freq: Four times a day (QID) | ORAL | Status: DC | PRN

## 2012-04-01 MED ORDER — AMOXICILLIN 500 MG PO CAPS: 500.0000 mg | ORAL_CAPSULE | Freq: Three times a day (TID) | ORAL | Status: DC

## 2012-04-01 NOTE — ED Notes (Signed)
The pt has a cold  With rt ear pain and difficulty hearing for 2 weeks

## 2012-04-01 NOTE — ED Provider Notes (Signed)
History   This chart was scribed for Karen Best. Oletta Lamas, MD by Sofie Rower, ED Scribe. The patient was seen in room TR07C/TR07C and the patient's care was started at 4:37PM.     CSN: [id number]  Arrival date & time 04/01/12  1549   First MD Initiated Contact with Patient 04/01/12 1637      Chief Complaint  Patient presents with  . Otalgia    (Consider location/radiation/quality/duration/timing/severity/associated sxs/prior treatment) HPI  Karen Best is a 56 y.o. female , with a hx of asthma, who presents to the Emergency Department complaining of gradual, progressively worsening, otalgia located at the right ear, onset two weeks ago. Associated symptoms include sore throat, non productive cough, and wheezing. The pt has taken cough drops which provide moderate relief of the sore throat.  She also complains of pain in side only with coughing.  No with movement, exertion.  Sometimes hurts worse with direct palpation to that area.  No rash  The pt denies nausea, vomiting, and diarrhea.  The pt is a current everyday smoker, however, she does not drink alcohol.   PCP is Dr. Bruna Potter.    Past Medical History  Diagnosis Date  . Fibromyalgia   . Schizophrenia     History reviewed. No pertinent past surgical history.  History reviewed. No pertinent family history.  History  Substance Use Topics  . Smoking status: Current Every Day Smoker  . Smokeless tobacco: Not on file  . Alcohol Use: No    OB History    Grav Para Term Preterm Abortions TAB SAB Ect Mult Living                  Review of Systems  Constitutional: Negative for fatigue.  HENT: Positive for hearing loss, ear pain and sore throat. Negative for nosebleeds, tinnitus and ear discharge.   Respiratory: Positive for cough. Negative for shortness of breath.   Cardiovascular: Positive for chest pain. Negative for palpitations and leg swelling.  Gastrointestinal: Negative for nausea, vomiting, abdominal pain and  diarrhea.  Musculoskeletal: Negative for back pain.    Allergies  Review of patient's allergies indicates no known allergies.  Home Medications   Current Outpatient Rx  Name  Route  Sig  Dispense  Refill  . ALBUTEROL SULFATE HFA 108 (90 BASE) MCG/ACT IN AERS   Inhalation   Inhale 2 puffs into the lungs every 4 (four) hours as needed for wheezing or shortness of breath.   1 Inhaler   3   . ARIPIPRAZOLE 15 MG PO TABS   Oral   Take 15 mg by mouth daily.         Marland Kitchen LITHIUM CARBONATE 300 MG PO TABS   Oral   Take 600 mg by mouth 2 (two) times daily.           . SENNA 8.6 MG PO TABS   Oral   Take 1 tablet by mouth daily.         Marland Kitchen ZOLPIDEM TARTRATE 10 MG PO TABS   Oral   Take 10 mg by mouth at bedtime as needed.         . ACETAMINOPHEN-CODEINE #3 300-30 MG PO TABS   Oral   Take 1-2 tablets by mouth every 6 (six) hours as needed for pain.   20 tablet   0   . AMOXICILLIN 500 MG PO CAPS   Oral   Take 1 capsule (500 mg total) by mouth 3 (three) times daily.  42 capsule   0     BP 147/91  Pulse 72  Temp 97.9 F (36.6 C) (Oral)  Resp 18  SpO2 99%  Physical Exam  Nursing note and vitals reviewed. Constitutional: She appears well-developed and well-nourished.  HENT:  Right Ear: Tympanic membrane is erythematous.  Left Ear: Tympanic membrane and ear canal normal.       Right ear: TM and canal erythematous.   Neck: Normal range of motion.  Cardiovascular: Normal rate and regular rhythm.   No murmur heard. Pulmonary/Chest: Effort normal and breath sounds normal. She has no wheezes.  Abdominal: Soft. She exhibits no distension. There is no tenderness. There is no rebound.  Neurological: She is alert.  Skin: Skin is warm and dry.    ED Course  Procedures (including critical care time)  DIAGNOSTIC STUDIES: Oxygen Saturation is 99% on room air, normal by my interpretation.    COORDINATION OF CARE:  5:00 PM- Treatment plan discussed with patient. Pt  agrees with treatment.      Labs Reviewed - No data to display No results found.   1. Otitis media   2. Cough       MDM  I personally performed the services described in this documentation, which was scribed in my presence. The recorded information has been reviewed and is accurate.   Pt is not toxic, no fever, normal RA sats.  No sig wheezing or asymetry on auscultation.  Pt has inhaler at home already.  Will put on abx to treat both otitis and bronchitis.      Karen Best. Denali Becvar, MD 04/01/12 [phone]

## 2012-04-01 NOTE — ED Notes (Signed)
Called for room  No answer  

## 2012-04-01 NOTE — Discharge Instructions (Signed)
 Cough, Adult  A cough is a reflex that helps clear your throat and airways. It can help heal the body or may be a reaction to an irritated airway. A cough may only last 2 or 3 weeks (acute) or may last more than 8 weeks (chronic).  CAUSES Acute cough:  Viral or bacterial infections. Chronic cough:  Infections.  Allergies.  Asthma.  Post-nasal drip.  Smoking.  Heartburn or acid reflux.  Some medicines.  Chronic lung problems (COPD).  Cancer. SYMPTOMS   Cough.  Fever.  Chest pain.  Increased breathing rate.  High-pitched whistling sound when breathing (wheezing).  Colored mucus that you cough up (sputum). TREATMENT   A bacterial cough may be treated with antibiotic medicine.  A viral cough must run its course and will not respond to antibiotics.  Your caregiver may recommend other treatments if you have a chronic cough. HOME CARE INSTRUCTIONS   Only take over-the-counter or prescription medicines for pain, discomfort, or fever as directed by your caregiver. Use cough suppressants only as directed by your caregiver.  Use a cold steam vaporizer or humidifier in your bedroom or home to help loosen secretions.  Sleep in a semi-upright position if your cough is worse at night.  Rest as needed.  Stop smoking if you smoke. SEEK IMMEDIATE MEDICAL CARE IF:   You have pus in your sputum.  Your cough starts to worsen.  You cannot control your cough with suppressants and are losing sleep.  You begin coughing up blood.  You have difficulty breathing.  You develop pain which is getting worse or is uncontrolled with medicine.  You have a fever. MAKE SURE YOU:   Understand these instructions.  Will watch your condition.  Will get help right away if you are not doing well or get worse. Document Released: 10/11/2010 Document Revised: 07/07/2011 Document Reviewed: 10/11/2010 [facility] Patient Information 2013 [place], [place].   Otitis Media, Adult A middle  ear infection is an infection in the space behind the eardrum. It often happens along with a cold. It is caused by a germ that starts growing in that space. Your neck may feel puffy (swollen) on the side of the ear infection. HOME CARE  Take your medicine as told. Finish it even if you start to feel better.  Nose medicine (nasal decongestant) may help the tube that connects the ear and throat (eustachian tube) drain better. It may also help with discomfort.  Follow up with your doctor in 10 to 14 days or as told by your doctor. This is to make sure the infection is gone. GET HELP RIGHT AWAY IF:   You do not start to feel better in 2 to 3 days.  You have pain that is not helped with medicine.  You cannot use the medicine as told.  You feel worse instead of better.  You develop puffiness, redness, or pain around the ear.  You get a stiff neck. MAKE SURE YOU:   Understand these instructions.  Will watch your condition.  Will get help right away if you are not doing well or get worse. Document Released: 10/01/2007 Document Revised: 07/07/2011 Document Reviewed: 10/01/2007 [facility] Patient Information 2013 [place], [place].

## 2012-04-01 NOTE — ED Notes (Signed)
Pt c/o cough X 2 weeks, denies any mucus production. Pt reports she needs a bus ticket.

## 2012-09-06 ENCOUNTER — Encounter (HOSPITAL_COMMUNITY): Payer: Self-pay | Admitting: Emergency Medicine

## 2012-09-06 ENCOUNTER — Emergency Department (HOSPITAL_COMMUNITY)
Admission: EM | Admit: 2012-09-06 | Discharge: 2012-09-07 | Disposition: A | Discharge status: Home / Self Care | Payer: Medicaid Other | Attending: Emergency Medicine | Admitting: Emergency Medicine

## 2012-09-06 DIAGNOSIS — Z79899 Other long term (current) drug therapy: Secondary | ICD-10-CM | POA: Insufficient documentation

## 2012-09-06 DIAGNOSIS — Z8739 Personal history of other diseases of the musculoskeletal system and connective tissue: Secondary | ICD-10-CM | POA: Insufficient documentation

## 2012-09-06 DIAGNOSIS — F14129 Cocaine abuse with intoxication, unspecified: Secondary | ICD-10-CM

## 2012-09-06 DIAGNOSIS — F1910 Other psychoactive substance abuse, uncomplicated: Secondary | ICD-10-CM | POA: Insufficient documentation

## 2012-09-06 DIAGNOSIS — F209 Schizophrenia, unspecified: Secondary | ICD-10-CM | POA: Insufficient documentation

## 2012-09-06 DIAGNOSIS — F17200 Nicotine dependence, unspecified, uncomplicated: Secondary | ICD-10-CM | POA: Insufficient documentation

## 2012-09-06 LAB — SALICYLATE LEVEL: Salicylate Lvl: <2 mg/dL — ABNORMAL LOW (ref 2.8–20.0)

## 2012-09-06 LAB — COMPREHENSIVE METABOLIC PANEL
ALT: 36 U/L — ABNORMAL HIGH (ref 0–35)
AST: 36 U/L (ref 0–37)
Albumin: 3.5 g/dL (ref 3.5–5.2)
Alkaline Phosphatase: 79 U/L (ref 39–117)
BUN: 5 mg/dL — ABNORMAL LOW (ref 6–23)
CO2: 25 mEq/L (ref 19–32)
Calcium: 9.5 mg/dL (ref 8.4–10.5)
Chloride: 102 mEq/L (ref 96–112)
Creatinine, Ser: 0.68 mg/dL (ref 0.50–1.10)
GFR calc Af Amer: >90 mL/min (ref >90)
GFR calc non Af Amer: >90 mL/min (ref >90)
Glucose, Bld: 92 mg/dL (ref 70–99)
Potassium: 3.5 mEq/L (ref 3.5–5.1)
Sodium: 135 mEq/L (ref 135–145)
Total Bilirubin: 0.2 mg/dL — ABNORMAL LOW (ref 0.3–1.2)
Total Protein: 7.6 g/dL (ref 6.0–8.3)

## 2012-09-06 LAB — CBC
HCT: 40.9 % (ref 36.0–46.0)
Hemoglobin: 13.1 g/dL (ref 12.0–15.0)
MCH: 23.4 pg — ABNORMAL LOW (ref 26.0–34.0)
MCHC: 32 g/dL (ref 30.0–36.0)
MCV: 73 fL — ABNORMAL LOW (ref 78.0–100.0)
Platelets: 224 10*3/uL (ref 150–400)
RBC: 5.6 MIL/uL — ABNORMAL HIGH (ref 3.87–5.11)
RDW: 14.6 % (ref 11.5–15.5)
WBC: 3.4 10*3/uL — ABNORMAL LOW (ref 4.0–10.5)

## 2012-09-06 LAB — RAPID URINE DRUG SCREEN, HOSP PERFORMED
Amphetamines: NOT DETECTED
Barbiturates: NOT DETECTED
Benzodiazepines: NOT DETECTED
Cocaine: POSITIVE — AB
Opiates: NOT DETECTED
Tetrahydrocannabinol: NOT DETECTED

## 2012-09-06 LAB — ACETAMINOPHEN LEVEL: Acetaminophen (Tylenol), Serum: <15 ug/mL (ref 10–30)

## 2012-09-06 LAB — ETHANOL: Alcohol, Ethyl (B): <11 mg/dL (ref 0–11)

## 2012-09-06 MED ORDER — ARIPIPRAZOLE 15 MG PO TABS
15.0000 mg | ORAL_TABLET | Freq: Every day | ORAL | Status: DC
Start: 2012-09-07 — End: 2012-09-07
  Administered 2012-09-07: 15 mg via ORAL
  Filled 2012-09-06: qty 1

## 2012-09-06 MED ORDER — ALUM & MAG HYDROXIDE-SIMETH 200-200-20 MG/5ML PO SUSP: 30.0000 mL | ORAL | Status: DC | PRN

## 2012-09-06 MED ORDER — IBUPROFEN 600 MG PO TABS: 600.0000 mg | ORAL_TABLET | Freq: Three times a day (TID) | ORAL | Status: DC | PRN

## 2012-09-06 MED ORDER — NICOTINE 21 MG/24HR TD PT24
21.0000 mg | MEDICATED_PATCH | Freq: Every day | TRANSDERMAL | Status: DC
  Administered 2012-09-06: 21 mg via TRANSDERMAL
  Filled 2012-09-06: qty 1

## 2012-09-06 MED ORDER — ZOLPIDEM TARTRATE 5 MG PO TABS
5.0000 mg | ORAL_TABLET | Freq: Every evening | ORAL | Status: DC | PRN
  Administered 2012-09-06: 5 mg via ORAL
  Filled 2012-09-06: qty 1

## 2012-09-06 MED ORDER — ONDANSETRON HCL 4 MG PO TABS: 4.0000 mg | ORAL_TABLET | Freq: Three times a day (TID) | ORAL | Status: DC | PRN

## 2012-09-06 MED ORDER — LORAZEPAM 1 MG PO TABS
1.0000 mg | ORAL_TABLET | Freq: Three times a day (TID) | ORAL | Status: DC | PRN
  Administered 2012-09-06: 1 mg via ORAL
  Filled 2012-09-06: qty 1

## 2012-09-06 MED ORDER — ALBUTEROL SULFATE HFA 108 (90 BASE) MCG/ACT IN AERS: 2.0000 | INHALATION_SPRAY | RESPIRATORY_TRACT | Status: DC | PRN

## 2012-09-06 NOTE — ED Provider Notes (Signed)
History     CSN: [id number]  Arrival date & time 09/06/12  1941   First MD Initiated Contact with Patient 09/06/12 2012      Chief Complaint  Patient presents with  . Medical Clearance   HPI  History provided by the patient. Patient is a 57 year old female with history of schizophrenia and fibromyalgia presents with requests for help with her schizophrenia. Patient states she has not been taking any of her medications for over the past 2 months. Patient does admit to recreational cocaine and narcotic use but states she is not wish to have any help with addiction. She states she is feeling unsafe at home where she is living. Patient states that she saw him and knocked on her door and told her that he was going to kill herself. The next day he returned and had stabbed himself in the stomach. Patient states she is feeling scared living at her home. She denies any SI or HI. She denies active hallucinations. Patient also complains of general body pains from her head to her toes. She has no other complaints. Denies any recent injuries. Denies any shortness of breath. No fever, chills or sweats.    Past Medical History  Diagnosis Date  . Fibromyalgia   . Schizophrenia     History reviewed. No pertinent past surgical history.  History reviewed. No pertinent family history.  History  Substance Use Topics  . Smoking status: Current Every Day Smoker  . Smokeless tobacco: Not on file  . Alcohol Use: No    OB History   Grav Para Term Preterm Abortions TAB SAB Ect Mult Living                  Review of Systems  Constitutional: Negative for fever and chills.  Respiratory: Negative for shortness of breath.   Cardiovascular: Negative for chest pain.  Gastrointestinal: Negative for nausea, vomiting, diarrhea and constipation.  Musculoskeletal: Positive for myalgias.  Neurological: Positive for headaches.  All other systems reviewed and are negative.    Allergies  Review of  patient's allergies indicates no known allergies.  Home Medications   Current Outpatient Rx  Name  Route  Sig  Dispense  Refill  . ARIPiprazole (ABILIFY) 15 MG tablet   Oral   Take 15 mg by mouth daily.         Marland Kitchen albuterol (PROVENTIL HFA;VENTOLIN HFA) 108 (90 BASE) MCG/ACT inhaler   Inhalation   Inhale 2 puffs into the lungs every 4 (four) hours as needed for wheezing or shortness of breath.   1 Inhaler   3   . lithium 300 MG tablet   Oral   Take 600 mg by mouth 2 (two) times daily.           Marland Kitchen zolpidem (AMBIEN) 10 MG tablet   Oral   Take 10 mg by mouth at bedtime as needed.           BP 139/90  Pulse 85  Temp(Src) 97.7 F (36.5 C) (Oral)  Resp 18  SpO2 100%  Physical Exam  Nursing note and vitals reviewed. Constitutional: She is oriented to person, place, and time. She appears well-developed and well-nourished. No distress.  HENT:  Head: Normocephalic and atraumatic.  Edentulous  Eyes: Conjunctivae and EOM are normal. Pupils are equal, round, and reactive to light.  Cardiovascular: Normal rate and regular rhythm.   Pulmonary/Chest: Effort normal and breath sounds normal. No respiratory distress. She has no wheezes. She has no  rales.  Abdominal: Soft. There is no tenderness.  Neurological: She is alert and oriented to person, place, and time.  Skin: Skin is warm and dry. No rash noted.  Psychiatric: Her behavior is normal. She expresses no homicidal and no suicidal ideation.    ED Course  Procedures   Results for orders placed during the hospital encounter of 09/06/12  CBC      Result Value Range   WBC 3.4 (*) 4.0 - 10.5 K/uL   RBC 5.60 (*) 3.87 - 5.11 MIL/uL   Hemoglobin 13.1  12.0 - 15.0 g/dL   HCT 40.9  81.1 - 91.4 %   MCV 73.0 (*) 78.0 - 100.0 fL   MCH 23.4 (*) 26.0 - 34.0 pg   MCHC 32.0  30.0 - 36.0 g/dL   RDW 78.2  95.6 - 21.3 %   Platelets 224  150 - 400 K/uL  URINE RAPID DRUG SCREEN (HOSP PERFORMED)      Result Value Range   Opiates NONE  DETECTED  NONE DETECTED   Cocaine POSITIVE (*) NONE DETECTED   Benzodiazepines NONE DETECTED  NONE DETECTED   Amphetamines NONE DETECTED  NONE DETECTED   Tetrahydrocannabinol NONE DETECTED  NONE DETECTED   Barbiturates NONE DETECTED  NONE DETECTED        1. Schizophrenia   2. Polysubstance abuse       MDM  8:10 PM patient seen and evaluated. Patient sitting calmly is cooperative in no acute distress. Denies SI or HI.  Patient moved to psych ED. Holding orders in place.  Spoke with Aurther Loft with [facility] act team.  She is aware of patient and will evaluated      Angus Seller, PA-C 09/06/12 2147

## 2012-09-06 NOTE — ED Provider Notes (Signed)
Medical screening examination/treatment/procedure(s) were performed by non-physician practitioner and as supervising physician I was immediately available for consultation/collaboration.    Celene Kras, MD 09/06/12 2255

## 2012-09-06 NOTE — ED Notes (Signed)
Per Ems - the patient does not feel same at home anymore. She has been out of her schizophrenia meds x 2 months and she is concerned for her safety.

## 2012-09-06 NOTE — ED Notes (Signed)
Patient states that she has generalized pain from Head to foot. The patient also states that the voices are talking to her and concerning her. The patient denies Si/Hi

## 2012-09-06 NOTE — ED Notes (Signed)
Pt changed into blue scrubs and red socks.

## 2012-09-06 NOTE — ED Notes (Signed)
Pt transferred from triage, presents for medical clearance.  Requesting help for body pain & pt ran out of meds, not taking meds x 2-3 mos. Hearing voices., feels unsafe at home..  Pt diag.with Schizophrenia.  Cooperative& anxious at present.

## 2012-09-07 DIAGNOSIS — Z9119 Patient's noncompliance with other medical treatment and regimen: Secondary | ICD-10-CM

## 2012-09-07 DIAGNOSIS — F209 Schizophrenia, unspecified: Secondary | ICD-10-CM

## 2012-09-07 DIAGNOSIS — F1410 Cocaine abuse, uncomplicated: Secondary | ICD-10-CM

## 2012-09-07 MED ORDER — IBUPROFEN 600 MG PO TABS: 600.0000 mg | ORAL_TABLET | Freq: Three times a day (TID) | ORAL | Status: DC | PRN

## 2012-09-07 NOTE — Consult Note (Signed)
Reason for Consult: generalized body pains and cocaine abuse Referring Physician: Dr. Delight Stare is an 57 y.o. female.  HPI: Patient was seen in chart reviewed. Patient presented presents to [facility] with generalized pain. Pt told medical staff that she has been using crack cocaine to control her body pains and also pain medications and she was hearing voices w/o command, denies SI/HI. Patient stated that she needed psychiatric medications to control her hallucinations and body pains and so needed ACT services from [facility] which she may not be quantified at this time. Patient states that she can't figure out what the voices are saying.  Patient does not want any help with substance abuse, states uses recreationally.  Patient urine drug screen is positive for cocaine. Patient has no safety concerns and contracts for safety. Patient has inpt admission hx with [facility](2001, 2012) and [facility](unk dates) Pt has oupt services with [facility] for med mgt/therapy.  Patient stated that she received medication Abilify from [facility].   patient has been living are by herself and was supported by CNA and landlord.    Mental Status Examination: Patient is a wake, alert and oriented time, place, person and situation. She is calm corporative and requesting medication management. She has no dentures in her mouth and I speech is audible and comprehensible but sometimes she needs a repeat herself. Patient maintained good eye contact. Patient has  fine  mood and his affect was  appropriate bright and full. Her thought process is linear and goal directed. Patient has denied suicidal, homicidal ideations, intentions or plans. Patient has no evidence of auditory or visual hallucinations, delusions, and paranoia. Patient has fair insight judgment and impulse control.  Past Medical History  Diagnosis Date  . Fibromyalgia   . Schizophrenia     History reviewed. No pertinent past surgical history.  History reviewed. No  pertinent family history.  Social History:  reports that she has been smoking.  She does not have any smokeless tobacco history on file. She reports that she does not drink alcohol or use illicit drugs.  Allergies: No Known Allergies  Medications: I have reviewed the patient's current medications.  Results for orders placed during the hospital encounter of 09/06/12 (from the past 48 hour(s))  URINE RAPID DRUG SCREEN (HOSP PERFORMED)     Status: Abnormal   Collection Time    09/06/12  8:02 PM      Result Value Range   Opiates NONE DETECTED  NONE DETECTED   Cocaine POSITIVE (*) NONE DETECTED   Benzodiazepines NONE DETECTED  NONE DETECTED   Amphetamines NONE DETECTED  NONE DETECTED   Tetrahydrocannabinol NONE DETECTED  NONE DETECTED   Barbiturates NONE DETECTED  NONE DETECTED   Comment:            DRUG SCREEN FOR MEDICAL PURPOSES     ONLY.  IF CONFIRMATION IS NEEDED     FOR ANY PURPOSE, NOTIFY LAB     WITHIN 5 DAYS.                LOWEST DETECTABLE LIMITS     FOR URINE DRUG SCREEN     Drug Class       Cutoff (ng/mL)     Amphetamine      1000     Barbiturate      200     Benzodiazepine   200     Tricyclics       300     Opiates  300     Cocaine          300     THC              50  ACETAMINOPHEN LEVEL     Status: None   Collection Time    09/06/12  8:15 PM      Result Value Range   Acetaminophen (Tylenol), Serum <15.0  10 - 30 ug/mL   Comment:            THERAPEUTIC CONCENTRATIONS VARY     SIGNIFICANTLY. A RANGE OF 10-30     ug/mL MAY BE AN EFFECTIVE     CONCENTRATION FOR MANY PATIENTS.     HOWEVER, SOME ARE BEST TREATED     AT CONCENTRATIONS OUTSIDE THIS     RANGE.     ACETAMINOPHEN CONCENTRATIONS     >150 ug/mL AT 4 HOURS AFTER     INGESTION AND >50 ug/mL AT 12     HOURS AFTER INGESTION ARE     OFTEN ASSOCIATED WITH TOXIC     REACTIONS.  CBC     Status: Abnormal   Collection Time    09/06/12  8:15 PM      Result Value Range   WBC 3.4 (*) 4.0 - 10.5 K/uL    Comment: ADJUSTED FOR NUCLEATED RBC'S   RBC 5.60 (*) 3.87 - 5.11 MIL/uL   Hemoglobin 13.1  12.0 - 15.0 g/dL   HCT 16.1  09.6 - 04.5 %   MCV 73.0 (*) 78.0 - 100.0 fL   MCH 23.4 (*) 26.0 - 34.0 pg   MCHC 32.0  30.0 - 36.0 g/dL   RDW 40.9  81.1 - 91.4 %   Platelets 224  150 - 400 K/uL  COMPREHENSIVE METABOLIC PANEL     Status: Abnormal   Collection Time    09/06/12  8:15 PM      Result Value Range   Sodium 135  135 - 145 mEq/L   Potassium 3.5  3.5 - 5.1 mEq/L   Chloride 102  96 - 112 mEq/L   CO2 25  19 - 32 mEq/L   Glucose, Bld 92  70 - 99 mg/dL   BUN 5 (*) 6 - 23 mg/dL   Creatinine, Ser 7.82  0.50 - 1.10 mg/dL   Calcium 9.5  8.4 - 95.6 mg/dL   Total Protein 7.6  6.0 - 8.3 g/dL   Albumin 3.5  3.5 - 5.2 g/dL   AST 36  0 - 37 U/L   ALT 36 (*) 0 - 35 U/L   Alkaline Phosphatase 79  39 - 117 U/L   Total Bilirubin 0.2 (*) 0.3 - 1.2 mg/dL   GFR calc non Af Amer >90  >90 mL/min   GFR calc Af Amer >90  >90 mL/min   Comment:            The eGFR has been calculated     using the CKD EPI equation.     This calculation has not been     validated in all clinical     situations.     eGFR's persistently     <90 mL/min signify     possible Chronic Kidney Disease.  ETHANOL     Status: None   Collection Time    09/06/12  8:15 PM      Result Value Range   Alcohol, Ethyl (B) <11  0 - 11 mg/dL   Comment:  LOWEST DETECTABLE LIMIT FOR     SERUM ALCOHOL IS 11 mg/dL     FOR MEDICAL PURPOSES ONLY  SALICYLATE LEVEL     Status: Abnormal   Collection Time    09/06/12  8:15 PM      Result Value Range   Salicylate Lvl <2.0 (*) 2.8 - 20.0 mg/dL    No results found.  Positive for anxiety, bad mood and illegal drug usage Blood pressure 95/61, pulse 64, temperature 98.6 F (37 C), temperature source Oral, resp. rate 16, SpO2 100.00%.   Assessment/Plan: Schizophrenia, Undiff;  Cocaine Abuse  Non compliance with out patient medication managment  Recommendation: Patient will be  referred to the outpatient psychiatric services at [facility]. Patient does not meet criteria for acute psychiatric hospitalization. No medications given during this visit.  Justinian Miano,JANARDHAHA R. 09/07/2012, 11:36 AM

## 2012-09-07 NOTE — BH Assessment (Signed)
Assessment Note   Karen Best is a 57 y.o. female who initially presents to [facility] with generalized pain.  Pt told medical staff that she was hearing voices w/o command, denies SI/HI.  Pt told this Clinical research associate that she's been off her medications for 2 mos and has been hearing voices for 5 days w/o command, states she came to the hospital because she was scared.  This Clinical research associate inquired about voices and what they are saying to pt, states that she can't figure out what they are saying.  Pt states she's beginning to feel unsafe at home.  Pt uses cocaine, does not want any help with substance abuse, states uses recreationally. Pt is + for cocaine. Pt has inpt admission hx with [facility](2001, 2012) and [facility](unk dates) Pt has oupt services with [facility] for med mgt/therapy.  This Clinical research associate asked if pt has discussed getting meds with [facility], pt did not disclose, however told this Clinical research associate that she's not had medication for 2mos due to financial reasons.  Pt was drowsy during interview. Pt will be evaluated by psychiatrist for final disposition.     Axis I: Schizophrenia, Undiff; Cocaine Abuse  Axis II: Deferred Axis III:  Past Medical History  Diagnosis Date  . Fibromyalgia   . Schizophrenia    Axis IV: other psychosocial or environmental problems, problems related to social environment and problems with primary support group Axis V: 41-50 serious symptoms  Past Medical History:  Past Medical History  Diagnosis Date  . Fibromyalgia   . Schizophrenia     History reviewed. No pertinent past surgical history.  Family History: History reviewed. No pertinent family history.  Social History:  reports that she has been smoking.  She does not have any smokeless tobacco history on file. She reports that she does not drink alcohol or use illicit drugs.  Additional Social History:  Alcohol / Drug Use Pain Medications: See MAR  Prescriptions: See MAR  Over the Counter: See MAR  History of alcohol / drug use?:  Yes Longest period of sobriety (when/how long): Pt uses cocaine   CIWA: CIWA-Ar BP: 151/110 mmHg Pulse Rate: 66 COWS:    Allergies: No Known Allergies  Home Medications:  (Not in a hospital admission)  OB/GYN Status:  No LMP recorded. Patient is postmenopausal.  General Assessment Data Location of Assessment: [facility] Living Arrangements: Alone Can pt return to current living arrangement?: No Admission Status: Voluntary Is patient capable of signing voluntary admission?: Yes Transfer from: Acute Hospital Referral Source: MD  Education Status Is patient currently in school?: No Current Grade: None  Highest grade of school patient has completed: None  Name of school: None  Contact person: None   Risk to self Suicidal Ideation: No Suicidal Intent: No Is patient at risk for suicide?: No Suicidal Plan?: No Access to Means: No What has been your use of drugs/alcohol within the last 12 months?: Pt denies  Previous Attempts/Gestures: No How many times?: 0 Other Self Harm Risks: None  Triggers for Past Attempts: None known Intentional Self Injurious Behavior: None Family Suicide History: No Recent stressful life event(s): Other (Comment) (Off meds x2 mos ) Persecutory voices/beliefs?: No Depression: No Depression Symptoms:  (None Reported ) Substance abuse history and/or treatment for substance abuse?: Yes (Abuses cocaine ) Suicide prevention information given to non-admitted patients: Not applicable  Risk to Others Homicidal Ideation: No Thoughts of Harm to Others: No Current Homicidal Intent: No Current Homicidal Plan: No Access to Homicidal Means: No Identified Victim: None  History of harm to others?: No Assessment of Violence: None Noted Violent Behavior Description: None  Does patient have access to weapons?: No Criminal Charges Pending?: No Does patient have a court date: No  Psychosis Hallucinations: Auditory Delusions: None noted  Mental Status  Report Appear/Hygiene: Disheveled Eye Contact: Poor Motor Activity: Unremarkable Speech: Logical/coherent Level of Consciousness: Alert Mood: Sad Affect: Sad Anxiety Level: None Thought Processes: Coherent;Relevant Judgement: Unimpaired Orientation: Person;Place;Situation Obsessive Compulsive Thoughts/Behaviors: None  Cognitive Functioning Concentration: Normal Memory: Recent Intact;Remote Intact IQ: Average Insight: Fair Impulse Control: Fair Appetite: Good Weight Loss: 0 Weight Gain: 0 Sleep: No Change Total Hours of Sleep: 6 Vegetative Symptoms: None  ADLScreening [facility]) Patient's cognitive ability adequate to safely complete daily activities?: Yes Patient able to express need for assistance with ADLs?: Yes Independently performs ADLs?: Yes (appropriate for developmental age)  Abuse/Neglect [facility]) Physical Abuse: Denies Verbal Abuse: Denies Sexual Abuse: Denies  Prior Inpatient Therapy Prior Inpatient Therapy: Yes Prior Therapy Dates: 2001, 2012 Prior Therapy Facilty/Provider(s): [facility], [facility]  Reason for Treatment: SA/Depression/SI   Prior Outpatient Therapy Prior Outpatient Therapy: Yes Prior Therapy Dates: Current  Prior Therapy Facilty/Provider(s): [facility]  Reason for Treatment: Med Mgt/Therapy  ADL Screening (condition at time of admission) Patient's cognitive ability adequate to safely complete daily activities?: Yes Patient able to express need for assistance with ADLs?: Yes Independently performs ADLs?: Yes (appropriate for developmental age) Weakness of Legs: None Weakness of Arms/Hands: None  Home Assistive Devices/Equipment Home Assistive Devices/Equipment: None  Therapy Consults (therapy consults require a physician order) PT Evaluation Needed: No OT Evalulation Needed: No SLP Evaluation Needed: No Abuse/Neglect Assessment (Assessment to be complete while patient is alone) Physical Abuse: Denies Verbal Abuse:  Denies Sexual Abuse: Denies Exploitation of patient/patient's resources: Denies Self-Neglect: Denies Values / Beliefs Cultural Requests During Hospitalization: None Spiritual Requests During Hospitalization: None Consults Spiritual Care Consult Needed: No Social Work Consult Needed: No Merchant navy officer (For Healthcare) Advance Directive: Patient does not have advance directive;Patient would not like information Pre-existing out of facility DNR order (yellow form or pink MOST form): No Nutrition Screen- [facility] Patient's home diet: Regular Have you recently lost weight without trying?: No Have you been eating poorly because of a decreased appetite?: No Malnutrition Screening Tool Score: 0  Additional Information 1:1 In Past 12 Months?: No CIRT Risk: No Elopement Risk: No Does patient have medical clearance?: Yes     Disposition:  Disposition Initial Assessment Completed for this Encounter: Yes Disposition of Patient: Referred to (AM Eval by psychiatrist ) Patient referred to: Other (Comment) (AM Eval by psychiatrist )  On Site Evaluation by:   Reviewed with Physician:     Murrell Redden 09/07/2012 2:34 AM

## 2012-09-07 NOTE — [facility] Suicide Risk Assessment (Signed)
Suicide Risk Assessment  Discharge Assessment     Demographic Factors:  Adolescent or young adult, Low socioeconomic status, Living alone and Unemployed  Mental Status Per Nursing Assessment::   On Admission:     Current Mental Status by Physician: NA  Loss Factors: Financial problems/change in socioeconomic status  Historical Factors: NA  Risk Reduction Factors:   Religious beliefs about death, Positive social support and Positive therapeutic relationship  Continued Clinical Symptoms:  Bipolar Disorder:   Depressive phase Alcohol/Substance Abuse/Dependencies  Cognitive Features That Contribute To Risk:  Polarized thinking    Suicide Risk:  Minimal: No identifiable suicidal ideation.  Patients presenting with no risk factors but with morbid ruminations; may be classified as minimal risk based on the severity of the depressive symptoms  Discharge Diagnoses:   AXIS I:  Schizoaffective Disorder, Substance Abuse, Substance Induced Mood Disorder and noncompliance AXIS II:  Deferred AXIS III:   Past Medical History  Diagnosis Date  . Fibromyalgia   . Schizophrenia    AXIS IV:  economic problems, other psychosocial or environmental problems and problems related to social environment AXIS V:  51-60 moderate symptoms  Plan Of Care/Follow-up recommendations:  Activity:  as tolerated Diet:  regular  Is patient on multiple antipsychotic therapies at discharge:  No   Has Patient had three or more failed trials of antipsychotic monotherapy by history:  No  Recommended Plan for Multiple Antipsychotic Therapies: Not applicable  Jiovani Mccammon,JANARDHAHA R. 09/07/2012, 11:53 AM

## 2012-11-18 ENCOUNTER — Emergency Department (HOSPITAL_COMMUNITY): Payer: Medicaid Other

## 2012-11-18 ENCOUNTER — Emergency Department (HOSPITAL_COMMUNITY)
Admission: EM | Admit: 2012-11-18 | Discharge: 2012-11-18 | Disposition: A | Discharge status: Home / Self Care | Payer: Medicaid Other | Attending: Emergency Medicine | Admitting: Emergency Medicine

## 2012-11-18 ENCOUNTER — Encounter (HOSPITAL_COMMUNITY): Payer: Self-pay

## 2012-11-18 DIAGNOSIS — Z79899 Other long term (current) drug therapy: Secondary | ICD-10-CM | POA: Insufficient documentation

## 2012-11-18 DIAGNOSIS — R61 Generalized hyperhidrosis: Secondary | ICD-10-CM | POA: Insufficient documentation

## 2012-11-18 DIAGNOSIS — R509 Fever, unspecified: Secondary | ICD-10-CM | POA: Insufficient documentation

## 2012-11-18 DIAGNOSIS — R062 Wheezing: Secondary | ICD-10-CM | POA: Insufficient documentation

## 2012-11-18 DIAGNOSIS — J3489 Other specified disorders of nose and nasal sinuses: Secondary | ICD-10-CM | POA: Insufficient documentation

## 2012-11-18 DIAGNOSIS — F17200 Nicotine dependence, unspecified, uncomplicated: Secondary | ICD-10-CM | POA: Insufficient documentation

## 2012-11-18 DIAGNOSIS — IMO0001 Reserved for inherently not codable concepts without codable children: Secondary | ICD-10-CM | POA: Insufficient documentation

## 2012-11-18 DIAGNOSIS — R350 Frequency of micturition: Secondary | ICD-10-CM | POA: Insufficient documentation

## 2012-11-18 DIAGNOSIS — M549 Dorsalgia, unspecified: Secondary | ICD-10-CM | POA: Insufficient documentation

## 2012-11-18 DIAGNOSIS — J069 Acute upper respiratory infection, unspecified: Secondary | ICD-10-CM | POA: Insufficient documentation

## 2012-11-18 DIAGNOSIS — R300 Dysuria: Secondary | ICD-10-CM | POA: Insufficient documentation

## 2012-11-18 DIAGNOSIS — F209 Schizophrenia, unspecified: Secondary | ICD-10-CM | POA: Insufficient documentation

## 2012-11-18 LAB — WET PREP, GENITAL
Clue Cells Wet Prep HPF POC: NONE SEEN
Trich, Wet Prep: NONE SEEN
Yeast Wet Prep HPF POC: NONE SEEN

## 2012-11-18 LAB — URINE MICROSCOPIC-ADD ON

## 2012-11-18 LAB — URINALYSIS, ROUTINE W REFLEX MICROSCOPIC
Bilirubin Urine: NEGATIVE
Glucose, UA: NEGATIVE mg/dL
Hgb urine dipstick: NEGATIVE
Ketones, ur: NEGATIVE mg/dL
Nitrite: NEGATIVE
Protein, ur: NEGATIVE mg/dL
Specific Gravity, Urine: 1.011 (ref 1.005–1.030)
Urobilinogen, UA: 1 mg/dL (ref 0.0–1.0)
pH: 7.5 (ref 5.0–8.0)

## 2012-11-18 MED ORDER — ALBUTEROL SULFATE HFA 108 (90 BASE) MCG/ACT IN AERS: 1.0000 | INHALATION_SPRAY | Freq: Four times a day (QID) | RESPIRATORY_TRACT | Status: DC | PRN

## 2012-11-18 MED ORDER — ALBUTEROL SULFATE (5 MG/ML) 0.5% IN NEBU
5.0000 mg | INHALATION_SOLUTION | Freq: Once | RESPIRATORY_TRACT | Status: AC
  Administered 2012-11-18: 5 mg via RESPIRATORY_TRACT
  Filled 2012-11-18: qty 0.5

## 2012-11-18 MED ORDER — IBUPROFEN 200 MG PO TABS: 400.0000 mg | ORAL_TABLET | Freq: Three times a day (TID) | ORAL | Status: DC | PRN

## 2012-11-18 MED ORDER — IPRATROPIUM BROMIDE 0.02 % IN SOLN
0.5000 mg | Freq: Once | RESPIRATORY_TRACT | Status: AC
  Administered 2012-11-18: 0.5 mg via RESPIRATORY_TRACT
  Filled 2012-11-18: qty 2.5

## 2012-11-18 NOTE — ED Provider Notes (Signed)
Medical screening examination/treatment/procedure(s) were performed by non-physician practitioner and as supervising physician I was immediately available for consultation/collaboration.   Aidenn Skellenger B. Bernette Mayers, MD 11/18/12 [phone]

## 2012-11-18 NOTE — ED Provider Notes (Signed)
CSN: [id number]     Arrival date & time 11/18/12  1409 History  This chart was scribed for Trixie Dredge, PA-C working with Leonette Most B. Bernette Mayers, MD by Greggory Stallion, ED scribe. This patient was seen in room TR10C/TR10C and the patient's care was started at 5:03 PM.   No chief complaint on file.  The history is provided by the patient.    HPI Comments: Karen Best is a 57 y.o. female who with h/o schizophrenia presents to the Emergency Department complaining of  a productive cough with white phlegm, fever, chills, nasal congestion and intermittent chills and sweats that started 5 days ago. She states she has had lower back pain for the last three months. Pt states she has dysuria and would like to be checked for STDs. Pt states she is also having urinary frequency. She states she had unprotected intercourse 1 month ago. Pt states she has been having trouble breathing since she is out of her inhaler. Pt states she would also like a medication refill for her arthritis and inhaler. Pt denies nausea, emesis, diarrhea, vaginal itching, and vaginal discharge as associated symptoms.   Past Medical History  Diagnosis Date  . Fibromyalgia   . Schizophrenia    Past Surgical History  Procedure Laterality Date  . Cesarean section    . Tubal ligation     History reviewed. No pertinent family history. History  Substance Use Topics  . Smoking status: Current Every Day Smoker  . Smokeless tobacco: Not on file  . Alcohol Use: No   OB History   Grav Para Term Preterm Abortions TAB SAB Ect Mult Living                 Review of Systems  Constitutional: Positive for fever and chills.  HENT: Positive for congestion.   Respiratory: Positive for cough.   Gastrointestinal: Negative for nausea, vomiting and diarrhea.  Genitourinary: Positive for dysuria and frequency. Negative for vaginal discharge.  Musculoskeletal: Positive for back pain.    Allergies  Review of patient's allergies indicates no  known allergies.  Home Medications   Current Outpatient Rx  Name  Route  Sig  Dispense  Refill  . ARIPiprazole (ABILIFY) 15 MG tablet   Oral   Take 15 mg by mouth daily.         Marland Kitchen lithium 300 MG tablet   Oral   Take 600 mg by mouth 2 (two) times daily.           Marland Kitchen zolpidem (AMBIEN) 10 MG tablet   Oral   Take 10 mg by mouth at bedtime as needed.         Marland Kitchen albuterol (PROVENTIL HFA;VENTOLIN HFA) 108 (90 BASE) MCG/ACT inhaler   Inhalation   Inhale 2 puffs into the lungs every 4 (four) hours as needed for wheezing or shortness of breath.   1 Inhaler   3    BP 132/82  Pulse 68  Temp(Src) 98.6 F (37 C) (Oral)  Resp 18  SpO2 100%  Physical Exam  Nursing note and vitals reviewed. Constitutional: She appears well-developed and well-nourished. No distress.  HENT:  Head: Normocephalic and atraumatic.  Neck: Neck supple.  Cardiovascular: Normal rate and regular rhythm.   Pulmonary/Chest: Effort normal. No respiratory distress. She has wheezes. She has no rales.  Abdominal: Soft. She exhibits no distension. There is no tenderness. There is no rebound and no guarding.  Genitourinary: Vagina normal. Uterus is not tender. Cervix exhibits no motion  tenderness and no discharge. Right adnexum displays no mass and no tenderness. Left adnexum displays no mass and no tenderness. No tenderness around the vagina. No vaginal discharge found.  Neurological: She is alert.  Skin: She is not diaphoretic.    ED Course   Procedures (including critical care time)  DIAGNOSTIC STUDIES: Oxygen Saturation is 100% on RA, normal by my interpretation.    COORDINATION OF CARE: 5:22 PM-Discussed treatment plan which includes chest xray and breathing treatment with pt at bedside and pt agreed to plan.   Labs Reviewed  WET PREP, GENITAL - Abnormal; Notable for the following:    WBC, Wet Prep HPF POC FEW (*)    All other components within normal limits  URINALYSIS, ROUTINE W REFLEX MICROSCOPIC  - Abnormal; Notable for the following:    APPearance HAZY (*)    Leukocytes, UA TRACE (*)    All other components within normal limits  URINE MICROSCOPIC-ADD ON - Abnormal; Notable for the following:    Squamous Epithelial / LPF FEW (*)    All other components within normal limits  GC/CHLAMYDIA PROBE AMP   Dg Chest 2 View  11/18/2012   *RADIOLOGY REPORT*  Clinical Data: Cough and short of breath  CHEST - 2 VIEW  Comparison: None.  Findings: Normal cardiac silhouette.  No effusion, infiltrate, or pneumothorax.  Chronic bronchitic change noted. No acute osseous abnormality.  IMPRESSION: No acute cardiopulmonary process.   Original Report Authenticated By: Genevive Bi, M.D.   1. URI (upper respiratory infection)     MDM  Pt with multiple complaints, mostly chronic issues, though with URI x 5 days.  CXR negative.  Pt also with urinary frequency and concern for STD due to unprotected sex.  UA and wet prep unremarkable.  GC/Chlam pending.  D/C home with albuterol, PCP follow up.  Arthritis is unchanged, no new concerns, will defer treatment to PCP.  Discussed all results with patient.  Pt given return precautions.  Pt verbalizes understanding and agrees with plan.       I personally performed the services described in this documentation, which was scribed in my presence. The recorded information has been reviewed and is accurate.   Trixie Dredge, PA-C 11/18/12 2327

## 2012-11-18 NOTE — ED Notes (Signed)
Pt presents with 1 month h/o bilateral lower abdominal pain.  Pt reports dysuria, denies any vaginal discharge, requests chest for STDs.  Pt admits to unprotected intercourse x 1 month ago.  Pt requesting medication for arthritis and inhaler.  Pt reports 5 day h/o productive cough with white phlegm, +nasal congestion and intermittent chills and sweats.

## 2012-11-18 NOTE — ED Notes (Signed)
Patient transported to X-ray 

## 2012-11-19 LAB — GC/CHLAMYDIA PROBE AMP
CT Probe RNA: NEGATIVE
GC Probe RNA: NEGATIVE

## 2013-01-28 ENCOUNTER — Other Ambulatory Visit (HOSPITAL_COMMUNITY): Payer: Self-pay | Admitting: Nurse Practitioner

## 2013-01-28 DIAGNOSIS — Z1231 Encounter for screening mammogram for malignant neoplasm of breast: Secondary | ICD-10-CM

## 2013-02-04 ENCOUNTER — Ambulatory Visit (HOSPITAL_COMMUNITY)
Admission: RE | Admit: 2013-02-04 | Discharge: 2013-02-04 | Disposition: A | Discharge status: Home / Self Care | Payer: Medicaid Other | Source: Ambulatory Visit | Attending: Nurse Practitioner | Admitting: Nurse Practitioner

## 2013-02-04 DIAGNOSIS — Z1231 Encounter for screening mammogram for malignant neoplasm of breast: Secondary | ICD-10-CM | POA: Insufficient documentation

## 2013-03-27 ENCOUNTER — Emergency Department (HOSPITAL_COMMUNITY)
Admission: EM | Admit: 2013-03-27 | Discharge: 2013-03-28 | Disposition: A | Discharge status: Other Acute Inpatient Hospital | Payer: Medicaid Other | Attending: Emergency Medicine | Admitting: Emergency Medicine

## 2013-03-27 ENCOUNTER — Encounter (HOSPITAL_COMMUNITY): Payer: Self-pay | Admitting: *Deleted

## 2013-03-27 DIAGNOSIS — Z8739 Personal history of other diseases of the musculoskeletal system and connective tissue: Secondary | ICD-10-CM | POA: Insufficient documentation

## 2013-03-27 DIAGNOSIS — Z880 Allergy status to penicillin: Secondary | ICD-10-CM | POA: Insufficient documentation

## 2013-03-27 DIAGNOSIS — F17200 Nicotine dependence, unspecified, uncomplicated: Secondary | ICD-10-CM | POA: Insufficient documentation

## 2013-03-27 DIAGNOSIS — Z79899 Other long term (current) drug therapy: Secondary | ICD-10-CM | POA: Insufficient documentation

## 2013-03-27 DIAGNOSIS — F209 Schizophrenia, unspecified: Secondary | ICD-10-CM | POA: Insufficient documentation

## 2013-03-27 LAB — COMPREHENSIVE METABOLIC PANEL
ALT: 113 U/L — ABNORMAL HIGH (ref 0–35)
AST: 91 U/L — ABNORMAL HIGH (ref 0–37)
Albumin: 3.8 g/dL (ref 3.5–5.2)
Alkaline Phosphatase: 92 U/L (ref 39–117)
BUN: 5 mg/dL — ABNORMAL LOW (ref 6–23)
CO2: 27 mEq/L (ref 19–32)
Calcium: 9.8 mg/dL (ref 8.4–10.5)
Chloride: 102 mEq/L (ref 96–112)
Creatinine, Ser: 0.7 mg/dL (ref 0.50–1.10)
GFR calc Af Amer: >90 mL/min (ref >90)
GFR calc non Af Amer: >90 mL/min (ref >90)
Glucose, Bld: 97 mg/dL (ref 70–99)
Potassium: 3.3 mEq/L — ABNORMAL LOW (ref 3.5–5.1)
Sodium: 138 mEq/L (ref 135–145)
Total Bilirubin: 0.3 mg/dL (ref 0.3–1.2)
Total Protein: 8.7 g/dL — ABNORMAL HIGH (ref 6.0–8.3)

## 2013-03-27 LAB — CBC
HCT: 43 % (ref 36.0–46.0)
Hemoglobin: 13.8 g/dL (ref 12.0–15.0)
MCH: 23.9 pg — ABNORMAL LOW (ref 26.0–34.0)
MCHC: 32.1 g/dL (ref 30.0–36.0)
MCV: 74.5 fL — ABNORMAL LOW (ref 78.0–100.0)
Platelets: 235 10*3/uL (ref 150–400)
RBC: 5.77 MIL/uL — ABNORMAL HIGH (ref 3.87–5.11)
RDW: 14.7 % (ref 11.5–15.5)
WBC: 4.5 10*3/uL (ref 4.0–10.5)

## 2013-03-27 LAB — RAPID URINE DRUG SCREEN, HOSP PERFORMED
Amphetamines: NOT DETECTED
Barbiturates: NOT DETECTED
Benzodiazepines: NOT DETECTED
Cocaine: NOT DETECTED
Opiates: NOT DETECTED
Tetrahydrocannabinol: NOT DETECTED

## 2013-03-27 LAB — ACETAMINOPHEN LEVEL: Acetaminophen (Tylenol), Serum: <15 ug/mL (ref 10–30)

## 2013-03-27 LAB — ETHANOL: Alcohol, Ethyl (B): <11 mg/dL (ref 0–11)

## 2013-03-27 LAB — SALICYLATE LEVEL: Salicylate Lvl: <2 mg/dL — ABNORMAL LOW (ref 2.8–20.0)

## 2013-03-27 MED ORDER — ALBUTEROL SULFATE HFA 108 (90 BASE) MCG/ACT IN AERS: 2.0000 | INHALATION_SPRAY | RESPIRATORY_TRACT | Status: DC | PRN

## 2013-03-27 MED ORDER — ALBUTEROL SULFATE HFA 108 (90 BASE) MCG/ACT IN AERS: 1.0000 | INHALATION_SPRAY | Freq: Four times a day (QID) | RESPIRATORY_TRACT | Status: DC | PRN

## 2013-03-27 MED ORDER — QUETIAPINE FUMARATE 50 MG PO TABS
50.0000 mg | ORAL_TABLET | Freq: Every day | ORAL | Status: DC
  Administered 2013-03-27: 50 mg via ORAL
  Filled 2013-03-27: qty 1
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
## 2013-03-27 NOTE — ED Notes (Signed)
Patient belongings are clear boots, white socks, jeans, underwear, black shirt, camo jacket, brown jacket, red scarf, green purse. Patient and belongings have been wanded by secuirty

## 2013-03-27 NOTE — ED Notes (Signed)
As per NP Alberteen Sam, pt to be eval by Psychiatrist in am for medication recommendations.

## 2013-03-27 NOTE — BH Assessment (Signed)
Tele Assessment Note   Karen Best is an 57 y.o. female who presents to [facility] Emergency Department with the chief complaint of medication reconciliation. Patient states that she is currently receiving outpatient services from [facility] in [place]. Patient reports that for the past four months she has experienced restlessness and the inability to "feel like myself". Patient states she has been taking Seroquel for almost a month to no alleviation of her symptoms. Patient was unable to verbalize additional symptoms beyond the feeling of restlessness. Patient reports that she relapsed on crack cocaine after 8 months of sobriety last week. Patient states that she consumed $300 worth of crack cocaine and that now she is depressed and upset with herself. She reports delusions of paranoia as she stated "There's a man in my apartment building. He be looking at me and I have to sleep in my clothes every night! I don't want no man. All I need is Jesus!". Patient was observed to have a guarded affect and demonstrated a tangential thought process as she often required redirection to answer questions posed by assessor. Patient constantly reported throughout assessment "I need help! These meds ain't working!". Patient denies SI/HI/AVH at this time. Disposition is pending.   Axis I: Mood Disorder NOS Axis II: Deferred Axis III:  Past Medical History  Diagnosis Date  . Fibromyalgia   . Schizophrenia    Axis IV: other psychosocial or environmental problems, problems related to social environment and problems with access to health care services Axis V: 51-60 moderate symptoms  Past Medical History:  Past Medical History  Diagnosis Date  . Fibromyalgia   . Schizophrenia     Past Surgical History  Procedure Laterality Date  . Cesarean section    . Tubal ligation      Family History: No family history on file.  Social History:  reports that she has been smoking.  She does not have any smokeless  tobacco history on file. She reports that she does not drink alcohol or use illicit drugs.  Additional Social History:  Alcohol / Drug Use History of alcohol / drug use?: Yes Substance #1 Name of Substance 1: Crack cocaine 1 - Age of First Use: 40s 1 - Amount (size/oz): $300 1 - Frequency: rarely 1 - Duration: years 1 - Last Use / Amount: Last week- patient reports she used $300 worth   CIWA: CIWA-Ar BP: 134/77 mmHg Pulse Rate: 59 COWS:    Allergies:  Allergies  Allergen Reactions  . Penicillins     unknown    Home Medications:  (Not in a hospital admission)  OB/GYN Status:  No LMP recorded. Patient is postmenopausal.  General Assessment Data Location of Assessment: [facility] Is this a Tele or Face-to-Face Assessment?: Tele Assessment Is this an Initial Assessment or a Re-assessment for this encounter?: Initial Assessment Living Arrangements: Alone Can pt return to current living arrangement?: Yes Admission Status: Voluntary Is patient capable of signing voluntary admission?: Yes Transfer from: Home Referral Source: Self/Family/Friend     [facility] Crisis Care Plan Living Arrangements: Alone Name of Psychiatrist: Vesta Mixer Name of Therapist: [facility]  Education Status Is patient currently in school?: No  Risk to self Suicidal Ideation: No-Not Currently/Within Last 6 Months Suicidal Intent: No-Not Currently/Within Last 6 Months Is patient at risk for suicide?: No Suicidal Plan?: No-Not Currently/Within Last 6 Months Access to Means: Yes Specify Access to Suicidal Means: Access to streets and pills What has been your use of drugs/alcohol within the last 12  months?: Crack cocaine Previous Attempts/Gestures: No How many times?:  (Patient denies) Other Self Harm Risks:  (Patient has been "on and off" her psychotropic medications) Triggers for Past Attempts: Unknown Intentional Self Injurious Behavior: None Family Suicide History: Unknown Recent  stressful life event(s): Other (Comment) (Patient reports her medications need to be adjusted ) Persecutory voices/beliefs?: No Depression: Yes Depression Symptoms: Isolating;Loss of interest in usual pleasures Substance abuse history and/or treatment for substance abuse?: Yes Suicide prevention information given to non-admitted patients: Not applicable  Risk to Others Homicidal Ideation: No Thoughts of Harm to Others: No Current Homicidal Intent: No Current Homicidal Plan: No Access to Homicidal Means: No Identified Victim: None History of harm to others?: No Assessment of Violence: None Noted Violent Behavior Description: None Does patient have access to weapons?: No Criminal Charges Pending?: No Does patient have a court date: No  Psychosis Hallucinations: None noted Delusions: None noted  Mental Status Report Appear/Hygiene: Disheveled Eye Contact: Fair Motor Activity: Restlessness Speech: Logical/coherent;Slurred Level of Consciousness: Alert Mood: Anxious;Helpless Affect: Anxious;Depressed Anxiety Level: Minimal Thought Processes: Coherent;Tangential Judgement: Impaired Orientation: Person;Place;Time;Situation Obsessive Compulsive Thoughts/Behaviors: None  Cognitive Functioning Concentration: Decreased Memory: Recent Intact;Remote Intact IQ: Average Insight: Poor Impulse Control: Poor Appetite: Poor Weight Loss: 0 Weight Gain: 0 Sleep: Decreased Total Hours of Sleep: 4 Vegetative Symptoms: None  ADLScreening [facility] Assessment Services) Patient's cognitive ability adequate to safely complete daily activities?: Yes Patient able to express need for assistance with ADLs?: Yes Independently performs ADLs?: Yes (appropriate for developmental age)  Prior Inpatient Therapy Prior Inpatient Therapy: Yes Prior Therapy Dates:  (July/August of 2014) Prior Therapy Facilty/Provider(s): [facility] Reason for Treatment: Depression/ Med Mgmt  Prior Outpatient Therapy Prior  Outpatient Therapy: Yes Prior Therapy Dates: Current Prior Therapy Facilty/Provider(s): [facility] Reason for Treatment: Depression/ Med Mgmt  ADL Screening (condition at time of admission) Patient's cognitive ability adequate to safely complete daily activities?: Yes Is the patient deaf or have difficulty hearing?: No Does the patient have difficulty seeing, even when wearing glasses/contacts?: No Does the patient have difficulty concentrating, remembering, or making decisions?: No Patient able to express need for assistance with ADLs?: Yes Does the patient have difficulty dressing or bathing?: No Independently performs ADLs?: Yes (appropriate for developmental age) Does the patient have difficulty walking or climbing stairs?: No Weakness of Legs: None Weakness of Arms/Hands: None  Home Assistive Devices/Equipment Home Assistive Devices/Equipment: None  Therapy Consults (therapy consults require a physician order) PT Evaluation Needed: No OT Evalulation Needed: No SLP Evaluation Needed: No Abuse/Neglect Assessment (Assessment to be complete while patient is alone) Physical Abuse: Denies Verbal Abuse: Denies Sexual Abuse: Denies Exploitation of patient/patient's resources: Denies Self-Neglect: Denies Values / Beliefs Cultural Requests During Hospitalization: None Spiritual Requests During Hospitalization: None Consults Spiritual Care Consult Needed: No Social Work Consult Needed: No      Additional Information 1:1 In Past 12 Months?: No CIRT Risk: No Elopement Risk: No Does patient have medical clearance?: Yes     Disposition:  Disposition Initial Assessment Completed for this Encounter: Yes Disposition of Patient: Inpatient treatment program Type of inpatient treatment program: Adult  Haskel Khan 03/27/2013 6:14 PM

## 2013-03-27 NOTE — ED Provider Notes (Signed)
CSN: [id number]     Arrival date & time 03/27/13  1309 History   First MD Initiated Contact with Patient 03/27/13 1332     Chief Complaint  Patient presents with  . Medical Clearance   (Consider location/radiation/quality/duration/timing/severity/associated sxs/prior Treatment) HPI  57yF with disruptive thoughts. Hx of "I'm a paranoid schizophrenic." Says that she "just get to thinking about something and I can't stop." Keeping her up all night and interfering with her ability to get anything done. Not specfically the same thoughts, but will perseverate on one thing such as thinking about the money she owes to someone and then jumping to another thought such as church and her pastor. Very little sleep. Reports was previously on lithium, zyprexa and seroquel but that the lithium and zyprexa was recently stopped. She is not sure why it was. Reports compliance with her seroquel. Denies SI or HI. No hallucinations.   Past Medical History  Diagnosis Date  . Fibromyalgia   . Schizophrenia    Past Surgical History  Procedure Laterality Date  . Cesarean section    . Tubal ligation     No family history on file. History  Substance Use Topics  . Smoking status: Current Every Day Smoker  . Smokeless tobacco: Not on file  . Alcohol Use: No   OB History   Grav Para Term Preterm Abortions TAB SAB Ect Mult Living                 Review of Systems  All systems reviewed and negative, other than as noted in HPI.   Allergies  Penicillins  Home Medications   Current Outpatient Rx  Name  Route  Sig  Dispense  Refill  . QUEtiapine (SEROQUEL) 50 MG tablet   Oral   Take 50 mg by mouth at bedtime.         Marland Kitchen EXPIRED: albuterol (PROVENTIL HFA;VENTOLIN HFA) 108 (90 BASE) MCG/ACT inhaler   Inhalation   Inhale 2 puffs into the lungs every 4 (four) hours as needed for wheezing or shortness of breath.   1 Inhaler   3   . albuterol (PROVENTIL HFA;VENTOLIN HFA) 108 (90 BASE) MCG/ACT  inhaler   Inhalation   Inhale 1-2 puffs into the lungs every 6 (six) hours as needed for wheezing.   1 Inhaler   0    BP 149/88  Pulse 82  Temp(Src) 98.2 F (36.8 C) (Oral)  Resp 16  SpO2 100% Physical Exam  Nursing note and vitals reviewed. Constitutional: She appears well-developed and well-nourished. No distress.  HENT:  Head: Normocephalic and atraumatic.  Eyes: Conjunctivae are normal. Right eye exhibits no discharge. Left eye exhibits no discharge.  Neck: Neck supple.  Cardiovascular: Normal rate, regular rhythm and normal heart sounds.  Exam reveals no gallop and no friction rub.   No murmur heard. Pulmonary/Chest: Effort normal and breath sounds normal. No respiratory distress.  Abdominal: Soft. She exhibits no distension. There is no tenderness.  Musculoskeletal: She exhibits no edema and no tenderness.  Neurological: She is alert.  Frequent lip smacking movement and swaying at trunk  Skin: Skin is warm and dry.  Psychiatric:  Poor attention span. Easily redirected but needs it frequently.      ED Course  Procedures (including critical care time) Labs Review Labs Reviewed  CBC - Abnormal; Notable for the following:    RBC 5.77 (*)    MCV 74.5 (*)    MCH 23.9 (*)    All other components within  normal limits  COMPREHENSIVE METABOLIC PANEL - Abnormal; Notable for the following:    Potassium 3.3 (*)    BUN 5 (*)    Total Protein 8.7 (*)    AST 91 (*)    ALT 113 (*)    All other components within normal limits  SALICYLATE LEVEL - Abnormal; Notable for the following:    Salicylate Lvl <2.0 (*)    All other components within normal limits  ACETAMINOPHEN LEVEL  ETHANOL  URINE RAPID DRUG SCREEN (HOSP PERFORMED)   Imaging Review No results found.  EKG Interpretation   None       MDM   1. Schizophrenia     57yF with disruptive thoughts. These are not necessarily negative in nature but interfering with her life. She is actually a quite pleasant  person. May need her medications adjusted. Will obtain psych eval for possible recommendations.    Raeford Razor, MD 03/27/13 [phone]

## 2013-03-27 NOTE — ED Notes (Signed)
Report given to michelle, rn

## 2013-03-27 NOTE — ED Notes (Signed)
NP Fran Hobson at bedside to eval pt. 

## 2013-03-28 ENCOUNTER — Encounter (HOSPITAL_COMMUNITY): Payer: Self-pay | Admitting: Emergency Medicine

## 2013-03-28 ENCOUNTER — Encounter (HOSPITAL_COMMUNITY): Payer: Self-pay | Admitting: *Deleted

## 2013-03-28 ENCOUNTER — Inpatient Hospital Stay (HOSPITAL_COMMUNITY)
Admission: AD | Admit: 2013-03-28 | Discharge: 2013-04-01 | DRG: 885 | Disposition: A | Discharge status: Home / Self Care | Payer: Medicaid Other | Source: Intra-hospital | Attending: Psychiatry | Admitting: Psychiatry

## 2013-03-28 DIAGNOSIS — F1410 Cocaine abuse, uncomplicated: Secondary | ICD-10-CM | POA: Diagnosis present

## 2013-03-28 DIAGNOSIS — F411 Generalized anxiety disorder: Secondary | ICD-10-CM | POA: Diagnosis present

## 2013-03-28 DIAGNOSIS — F259 Schizoaffective disorder, unspecified: Principal | ICD-10-CM | POA: Diagnosis present

## 2013-03-28 DIAGNOSIS — F209 Schizophrenia, unspecified: Secondary | ICD-10-CM

## 2013-03-28 DIAGNOSIS — H5316 Psychophysical visual disturbances: Secondary | ICD-10-CM | POA: Diagnosis present

## 2013-03-28 DIAGNOSIS — F17200 Nicotine dependence, unspecified, uncomplicated: Secondary | ICD-10-CM | POA: Diagnosis present

## 2013-03-28 DIAGNOSIS — IMO0001 Reserved for inherently not codable concepts without codable children: Secondary | ICD-10-CM | POA: Diagnosis present

## 2013-03-28 MED ORDER — ACETAMINOPHEN 325 MG PO TABS: 650.0000 mg | ORAL_TABLET | Freq: Four times a day (QID) | ORAL | Status: DC | PRN

## 2013-03-28 MED ORDER — NICOTINE 14 MG/24HR TD PT24
14.0000 mg | MEDICATED_PATCH | Freq: Every day | TRANSDERMAL | Status: DC
  Administered 2013-03-29 – 2013-04-01 (×3): 14 mg via TRANSDERMAL
  Filled 2013-03-28 (×7): qty 1

## 2013-03-28 MED ORDER — CLONAZEPAM 0.5 MG PO TABS
0.5000 mg | ORAL_TABLET | Freq: Every day | ORAL | Status: DC
  Administered 2013-03-28: 0.5 mg via ORAL
  Filled 2013-03-28: qty 1

## 2013-03-28 MED ORDER — ALBUTEROL SULFATE HFA 108 (90 BASE) MCG/ACT IN AERS: 1.0000 | INHALATION_SPRAY | Freq: Four times a day (QID) | RESPIRATORY_TRACT | Status: DC | PRN

## 2013-03-28 MED ORDER — ALBUTEROL SULFATE HFA 108 (90 BASE) MCG/ACT IN AERS: 2.0000 | INHALATION_SPRAY | RESPIRATORY_TRACT | Status: DC | PRN

## 2013-03-28 MED ORDER — POTASSIUM CHLORIDE 20 MEQ PO PACK: 40.0000 meq | PACK | Freq: Once | ORAL | Status: DC

## 2013-03-28 MED ORDER — QUETIAPINE FUMARATE 50 MG PO TABS
50.0000 mg | ORAL_TABLET | Freq: Every day | ORAL | Status: DC
  Filled 2013-03-28: qty 1

## 2013-03-28 MED ORDER — MAGNESIUM HYDROXIDE 400 MG/5ML PO SUSP
30.0000 mL | Freq: Every day | ORAL | Status: DC | PRN
  Administered 2013-03-29: 30 mL via ORAL

## 2013-03-28 MED ORDER — ALUM & MAG HYDROXIDE-SIMETH 200-200-20 MG/5ML PO SUSP
30.0000 mL | ORAL | Status: DC | PRN
  Administered 2013-03-28 – 2013-03-31 (×8): 30 mL via ORAL

## 2013-03-28 MED ORDER — ALUM & MAG HYDROXIDE-SIMETH 200-200-20 MG/5ML PO SUSP
30.0000 mL | Freq: Once | ORAL | Status: AC
  Administered 2013-03-28: 30 mL via ORAL
  Filled 2013-03-28: qty 30

## 2013-03-28 MED ORDER — POTASSIUM CHLORIDE CRYS ER 20 MEQ PO TBCR
40.0000 meq | EXTENDED_RELEASE_TABLET | Freq: Once | ORAL | Status: AC
  Administered 2013-03-28: 40 meq via ORAL
  Filled 2013-03-28: qty 2

## 2013-03-28 NOTE — Progress Notes (Signed)
Patient ID: Karen Best, female   DOB: [birth year], 56 y.o.   MRN: [id number] Nursing admission note:  Patient admitted to [facility] for medication stabilization.  Patient also complaining of racing thought.  Patient is currently followed by [facility] for her medications.  Patient had been on abilify and lithium, but has not taken them in 5 weeks.  She also states that the seroquel she is taking (1 month) is not working.  She has had feelings of "restlessness" and "not feeling like myself."  She denies any SI/HI/AVH.  Patient denies any ETOH use, however, relapsed on crack cocaine after 8 months of sobriety.  She reports delusions of paranoia.  She feels someone is watching her while she sleeps, so she sleeps fully clothed.  Patient is pleasant and cooperative.  Past medical hx incl. Fibromyalgia.  Patient was oriented to unit and room.

## 2013-03-28 NOTE — Progress Notes (Signed)
Pt interviewed with NP. Note reviewed. Agree with above assessment and plan.

## 2013-03-28 NOTE — Progress Notes (Signed)
Writer consulted with the NP Julieanne Cotton) regarding the patient meeting criteria for inpatient hospitalization.  Writer informed the [facility] Minerva Areola) and referred the placement for placement on the [address].

## 2013-03-28 NOTE — Progress Notes (Signed)
Patient ID: Karen Best, female   DOB: Jun 25, 1955, 57 y.o.   MRN: [id number] Psychiatric Specialty Exam: Physical Exam  ROS  Blood pressure 133/73, pulse 65, temperature 97.6 F (36.4 C), temperature source Oral, resp. rate 18, SpO2 99.00%.There is no height or weight on file to calculate BMI.  General Appearance: Casual  Eye Contact::  Good  Speech:  Clear and Coherent and Normal Rate  Volume:  Normal  Mood:  Depressed  Affect:  Congruent  Thought Process:  Goal Directed and Intact  Orientation:  Full (Time, Place, and Person)  Thought Content:  Hallucinations: Auditory Visual  Suicidal Thoughts:  No  Homicidal Thoughts:  No  Memory:  Immediate;   Good Recent;   Good Remote;   Good  Judgement:  Fair  Insight:  Good  Psychomotor Activity:  TD and of her mouth area, constantly chewing with smacking sound.  Concentration:  Good  Recall:  NA  Akathisia:  NA  Handed:  Right  AIMS (if indicated):     Assets:  Desire for Improvement Housing  Sleep:      Patient was seen by this Clinical research associate and Dr Tawni Carnes this am.  Patient is here for medication management.  Patient sees a Therapist, sports at [place] who asked her to stop taking her Lithium and Abilify.  Patient states she has been more restless without these two medications.   Patient reports also not getting much sleep at night due to her environment.  She reports men coming to her apartment and offering her Cocaine.  Patient also states she is afraid of the men living around her bothering and touching her.  She also states she does see a man sitting on a couch but only to find out no man is there.  She hears sounds of "waves and a song stating [place] is my home" .  Patient reports he can not sit still and is constantly walking.  When suggested to move into a group home she refused stating " I will lose money and cannot live on $60 a month"  We will admit her to our [address] hall for safety and stabilization.  She denies SI/HI.  Plan: Admit  to [address] unit for safety and stabilization. Dahlia Byes  PMHNP-BC

## 2013-03-28 NOTE — Progress Notes (Signed)
CSW completed and faxed support paperwork. CSW informed rn and np.   Catha Gosselin, LCSW [phone]  ED CSW .03/28/2013 1434pm

## 2013-03-28 NOTE — Consult Note (Signed)
[facility] Face-to-Face Psychiatry Consult   Reason for Consult:  Referral for evaluation for inpatient psychiatric treatment Referring Physician:  EDP NANCEE Best is an 57 y.o. female.  Assessment: AXIS I:  Bipolar, Depressed and Mood Disorder NOS AXIS II:  Deferred AXIS III:   Past Medical History  Diagnosis Date  . Fibromyalgia   . Schizophrenia    AXIS IV:  other psychosocial or environmental problems AXIS V:  31-40 impairment in reality testing  Plan:  Have psychiatrist evaluate patient in the morning.   Subjective:   Karen Best is a 57 y.o. female patient who came to the emergency department for medication reconciliation. Patient states that she has been taking Seroquel for an unknown length of time. States that she is compliant with taking Seroquel. States "I needs help. I been trying for 4 months." States that she has been to [facility] and was taken off Abilify and Lithium. Next appointment at [facility] is December 10th and "they wanted to try me off the medication for 6 weeks." States she has been off Abilify and Lithium for 3 weeks. States that since she has been off these meds "I feel like I'm going to lose my mind." States "my mind is racing, my nerves so bad I vomit and I can't be still." States she sleeps "maybe 4 hours" at night and that "I'm up and down all night." Denies SI and HI at this time. When asked about audible hallucinations, patient states " my mind feels like it is swaying and it goes to talking to me" but that she hears no commands. Patient states that she does see a man sitting on the couch at her assisted living facility some times and when she looks back he's gone. Denies ETOH use. States "my first and last time using crack was 2 weeks ago."   Past Psychiatric History: Past Medical History  Diagnosis Date  . Fibromyalgia   . Schizophrenia     reports that she has been smoking.  She does not have any smokeless tobacco history on file. She reports that  she does not drink alcohol or use illicit drugs. No family history on file. Family History Substance Abuse: No Family Supports: No Living Arrangements: Alone Can pt return to current living arrangement?: Yes Abuse/Neglect [facility]) Physical Abuse: Denies Verbal Abuse: Denies Sexual Abuse: Denies Allergies:   Allergies  Allergen Reactions  . Penicillins     unknown    ACT Assessment Complete:  Yes:    Educational Status    Risk to Self: Risk to self Suicidal Ideation: No-Not Currently/Within Last 6 Months Suicidal Intent: No-Not Currently/Within Last 6 Months Is patient at risk for suicide?: No Suicidal Plan?: No-Not Currently/Within Last 6 Months Access to Means: Yes Specify Access to Suicidal Means: Access to streets and pills What has been your use of drugs/alcohol within the last 12 months?: Crack cocaine Previous Attempts/Gestures: No How many times?:  (Patient denies) Other Self Harm Risks:  (Patient has been "on and off" her psychotropic medications) Triggers for Past Attempts: Unknown Intentional Self Injurious Behavior: None Family Suicide History: Unknown Recent stressful life event(s): Other (Comment) (Patient reports her medications need to be adjusted ) Persecutory voices/beliefs?: No Depression: Yes Depression Symptoms: Isolating;Loss of interest in usual pleasures Substance abuse history and/or treatment for substance abuse?: Yes Suicide prevention information given to non-admitted patients: Not applicable  Risk to Others: Risk to Others Homicidal Ideation: No Thoughts of Harm to Others: No Current Homicidal Intent: No Current Homicidal  Plan: No Access to Homicidal Means: No Identified Victim: None History of harm to others?: No Assessment of Violence: None Noted Violent Behavior Description: None Does patient have access to weapons?: No Criminal Charges Pending?: No Does patient have a court date: No  Abuse: Abuse/Neglect Assessment (Assessment to be  complete while patient is alone) Physical Abuse: Denies Verbal Abuse: Denies Sexual Abuse: Denies Exploitation of patient/patient's resources: Denies Self-Neglect: Denies  Prior Inpatient Therapy: Prior Inpatient Therapy Prior Inpatient Therapy: Yes Prior Therapy Dates:  (July/August of 2014) Prior Therapy Facilty/Provider(s): [facility] Reason for Treatment: Depression/ Med Mgmt  Prior Outpatient Therapy: Prior Outpatient Therapy Prior Outpatient Therapy: Yes Prior Therapy Dates: Current Prior Therapy Facilty/Provider(s): [facility] Reason for Treatment: Depression/ Med Mgmt  Additional Information: Additional Information 1:1 In Past 12 Months?: No CIRT Risk: No Elopement Risk: No Does patient have medical clearance?: Yes                  Objective: Blood pressure 134/80, pulse 61, temperature 97.4 F (36.3 C), temperature source Oral, resp. rate 18, SpO2 99.00%.There is no height or weight on file to calculate BMI. Results for orders placed during the hospital encounter of 03/27/13 (from the past 72 hour(s))  URINE RAPID DRUG SCREEN (HOSP PERFORMED)     Status: None   Collection Time    03/27/13  1:41 PM      Result Value Range   Opiates NONE DETECTED  NONE DETECTED   Cocaine NONE DETECTED  NONE DETECTED   Benzodiazepines NONE DETECTED  NONE DETECTED   Amphetamines NONE DETECTED  NONE DETECTED   Tetrahydrocannabinol NONE DETECTED  NONE DETECTED   Barbiturates NONE DETECTED  NONE DETECTED   Comment:            DRUG SCREEN FOR MEDICAL PURPOSES     ONLY.  IF CONFIRMATION IS NEEDED     FOR ANY PURPOSE, NOTIFY LAB     WITHIN 5 DAYS.                LOWEST DETECTABLE LIMITS     FOR URINE DRUG SCREEN     Drug Class       Cutoff (ng/mL)     Amphetamine      1000     Barbiturate      200     Benzodiazepine   200     Tricyclics       300     Opiates          300     Cocaine          300     THC              50  ACETAMINOPHEN LEVEL     Status: None   Collection Time     03/27/13  1:44 PM      Result Value Range   Acetaminophen (Tylenol), Serum <15.0  10 - 30 ug/mL   Comment:            THERAPEUTIC CONCENTRATIONS VARY     SIGNIFICANTLY. A RANGE OF 10-30     ug/mL MAY BE AN EFFECTIVE     CONCENTRATION FOR MANY PATIENTS.     HOWEVER, SOME ARE BEST TREATED     AT CONCENTRATIONS OUTSIDE THIS     RANGE.     ACETAMINOPHEN CONCENTRATIONS     >150 ug/mL AT 4 HOURS AFTER     INGESTION AND >50 ug/mL AT 12  HOURS AFTER INGESTION ARE     OFTEN ASSOCIATED WITH TOXIC     REACTIONS.  CBC     Status: Abnormal   Collection Time    03/27/13  1:44 PM      Result Value Range   WBC 4.5  4.0 - 10.5 K/uL   RBC 5.77 (*) 3.87 - 5.11 MIL/uL   Hemoglobin 13.8  12.0 - 15.0 g/dL   Comment: RESULT REPEATED AND VERIFIED   HCT 43.0  36.0 - 46.0 %   MCV 74.5 (*) 78.0 - 100.0 fL   MCH 23.9 (*) 26.0 - 34.0 pg   MCHC 32.1  30.0 - 36.0 g/dL   RDW 40.9  81.1 - 91.4 %   Platelets 235  150 - 400 K/uL  COMPREHENSIVE METABOLIC PANEL     Status: Abnormal   Collection Time    03/27/13  1:44 PM      Result Value Range   Sodium 138  135 - 145 mEq/L   Potassium 3.3 (*) 3.5 - 5.1 mEq/L   Chloride 102  96 - 112 mEq/L   CO2 27  19 - 32 mEq/L   Glucose, Bld 97  70 - 99 mg/dL   BUN 5 (*) 6 - 23 mg/dL   Creatinine, Ser 7.82  0.50 - 1.10 mg/dL   Calcium 9.8  8.4 - 95.6 mg/dL   Total Protein 8.7 (*) 6.0 - 8.3 g/dL   Albumin 3.8  3.5 - 5.2 g/dL   AST 91 (*) 0 - 37 U/L   ALT 113 (*) 0 - 35 U/L   Alkaline Phosphatase 92  39 - 117 U/L   Total Bilirubin 0.3  0.3 - 1.2 mg/dL   GFR calc non Af Amer >90  >90 mL/min   GFR calc Af Amer >90  >90 mL/min   Comment: (NOTE)     The eGFR has been calculated using the CKD EPI equation.     This calculation has not been validated in all clinical situations.     eGFR's persistently <90 mL/min signify possible Chronic Kidney     Disease.  ETHANOL     Status: None   Collection Time    03/27/13  1:44 PM      Result Value Range   Alcohol, Ethyl  (B) <11  0 - 11 mg/dL   Comment:            LOWEST DETECTABLE LIMIT FOR     SERUM ALCOHOL IS 11 mg/dL     FOR MEDICAL PURPOSES ONLY  SALICYLATE LEVEL     Status: Abnormal   Collection Time    03/27/13  1:44 PM      Result Value Range   Salicylate Lvl <2.0 (*) 2.8 - 20.0 mg/dL   Labs are reviewed and are pertinent for K+ 3.3.  Current Facility-Administered Medications  Medication Dose Route Frequency Provider Last Rate Last Dose  . albuterol (PROVENTIL HFA;VENTOLIN HFA) 108 (90 BASE) MCG/ACT inhaler 1-2 puff  1-2 puff Inhalation Q6H PRN Raeford Razor, MD      . albuterol (PROVENTIL HFA;VENTOLIN HFA) 108 (90 BASE) MCG/ACT inhaler 2 puff  2 puff Inhalation Q4H PRN Raeford Razor, MD      . QUEtiapine (SEROQUEL) tablet 50 mg  50 mg Oral QHS Raeford Razor, MD   50 mg at 03/27/13 2108   Current Outpatient Prescriptions  Medication Sig Dispense Refill  . QUEtiapine (SEROQUEL) 50 MG tablet Take 50 mg by mouth at bedtime.      Marland Kitchen  albuterol (PROVENTIL HFA;VENTOLIN HFA) 108 (90 BASE) MCG/ACT inhaler Inhale 2 puffs into the lungs every 4 (four) hours as needed for wheezing or shortness of breath.  1 Inhaler  3  . albuterol (PROVENTIL HFA;VENTOLIN HFA) 108 (90 BASE) MCG/ACT inhaler Inhale 1-2 puffs into the lungs every 6 (six) hours as needed for wheezing.  1 Inhaler  0    Psychiatric Specialty Exam:     Blood pressure 134/80, pulse 61, temperature 97.4 F (36.3 C), temperature source Oral, resp. rate 18, SpO2 99.00%.There is no height or weight on file to calculate BMI.  General Appearance: Fairly Groomed  Patent attorney::  Fair  Speech:  Clear and Coherent  Volume:  Normal  Mood:  Anxious  Affect:  Appropriate  Thought Process:  Coherent  Orientation:  Full (Time, Place, and Person)  Thought Content:  Hallucinations: Auditory  Suicidal Thoughts:  No  Homicidal Thoughts:  No  Memory:  Immediate;   Fair Recent;   Fair Remote;   Fair  Judgement:  Fair  Insight:  Fair  Psychomotor  Activity:  Increased and Restlessness  Concentration:  Fair  Recall:  Fair  Akathisia:  Yes  Handed:  Right  AIMS (if indicated):     Assets:  Desire for Improvement Housing  Sleep:      Treatment Plan Summary: Medication management to be determined by psychiatrist after evaluation in the morning.   Alberteen Sam, FNP-BC  03/28/2013 12:44 AM

## 2013-03-28 NOTE — Progress Notes (Signed)
Patient ID: Karen Best, female   DOB: 06/10/1955, 57 y.o.   MRN: [id number] D: Pt. Lying supine, makes eye contact, expresses concern about her blood work, "I'm trying to figure something out" "they ran my blood and said I got something eating up my liver, but I don't drink" "I use to use crack" Pt. Reports she hadn't used in about eight weeks. Pt. Reports "they suppose to be helping me get my medicine" "I can't be still and my mind is racing, that's why I'm over here" Pt. Exhibiting s/s of tardive dyskinesia, smacking of lips, involuntary tongue movement, and finger tapping. A: Writer introduced self to client and encouraged her to speak to physician concerning labs, but also brought to her attention that toxins put in her body could cause liver damage. Pt. Denies AVH, SHI. Staff will monitor q62min for safety. Pt. Encouraged to attend group. R: Pt. Is safe on the unit. She did not attend group. Writer staffed with Jilda Roche. PA with concerns of TD. Seroquel held and Klonopin ordered for tonight.(See new orders).

## 2013-03-28 NOTE — Progress Notes (Signed)
Writer has been accepted to Bed 403-1 at [facility] per [facility] Minerva Areola).

## 2013-03-29 DIAGNOSIS — F1410 Cocaine abuse, uncomplicated: Secondary | ICD-10-CM

## 2013-03-29 DIAGNOSIS — F200 Paranoid schizophrenia: Secondary | ICD-10-CM

## 2013-03-29 LAB — COMPREHENSIVE METABOLIC PANEL
ALT: 116 U/L — ABNORMAL HIGH (ref 0–35)
AST: 110 U/L — ABNORMAL HIGH (ref 0–37)
Albumin: 3.5 g/dL (ref 3.5–5.2)
Alkaline Phosphatase: 95 U/L (ref 39–117)
BUN: 11 mg/dL (ref 6–23)
CO2: 27 mEq/L (ref 19–32)
Calcium: 9.7 mg/dL (ref 8.4–10.5)
Chloride: 102 mEq/L (ref 96–112)
Creatinine, Ser: 0.76 mg/dL (ref 0.50–1.10)
GFR calc Af Amer: >90 mL/min (ref >90)
GFR calc non Af Amer: >90 mL/min (ref >90)
Glucose, Bld: 91 mg/dL (ref 70–99)
Potassium: 4.8 mEq/L (ref 3.5–5.1)
Sodium: 135 mEq/L (ref 135–145)
Total Bilirubin: 0.2 mg/dL — ABNORMAL LOW (ref 0.3–1.2)
Total Protein: 8.2 g/dL (ref 6.0–8.3)

## 2013-03-29 MED ORDER — PALIPERIDONE ER 3 MG PO TB24
3.0000 mg | ORAL_TABLET | Freq: Every day | ORAL | Status: DC
  Administered 2013-03-29 – 2013-03-31 (×3): 3 mg via ORAL
  Filled 2013-03-29 (×5): qty 1

## 2013-03-29 MED ORDER — LITHIUM CARBONATE ER 450 MG PO TBCR
450.0000 mg | EXTENDED_RELEASE_TABLET | Freq: Two times a day (BID) | ORAL | Status: DC
  Administered 2013-03-29 – 2013-04-01 (×7): 450 mg via ORAL
  Filled 2013-03-29 (×2): qty 1
  Filled 2013-03-29 (×2): qty 6
  Filled 2013-03-29 (×8): qty 1

## 2013-03-29 MED ORDER — AMANTADINE HCL 100 MG PO CAPS
100.0000 mg | ORAL_CAPSULE | Freq: Two times a day (BID) | ORAL | Status: DC
  Administered 2013-03-29 – 2013-04-01 (×6): 100 mg via ORAL
  Filled 2013-03-29: qty 6
  Filled 2013-03-29 (×7): qty 1
  Filled 2013-03-29: qty 6
  Filled 2013-03-29: qty 1

## 2013-03-29 MED ORDER — BENZTROPINE MESYLATE 1 MG PO TABS
1.0000 mg | ORAL_TABLET | Freq: Two times a day (BID) | ORAL | Status: DC
  Administered 2013-03-29 – 2013-04-01 (×6): 1 mg via ORAL
  Filled 2013-03-29 (×2): qty 1
  Filled 2013-03-29: qty 6
  Filled 2013-03-29 (×4): qty 1
  Filled 2013-03-29: qty 6
  Filled 2013-03-29 (×4): qty 1

## 2013-03-29 NOTE — H&P (Signed)
Psychiatric Admission Assessment Adult  Patient Identification:  Karen Best Date of Evaluation:  03/29/2013 Chief Complaint:  BIPOLAR History of Present Illness:Karen Best is a 57 year old AAF who presented to the [facility] with family member stating that her medications needed to be adjusted. She has history of paranoid schizophrenia stating she was diagnosed in her 55's. She is currently followed by [facility] and sees Dr. Marlinda Mike there. She was hoping to stretch out the time between her follow up appointments at [facility] to every 6 weeks, but she has been off of her current medications for 4 weeks and does not feel that she can wait until December the 10th for her follow up appointment. She states she is seeing a man sitting on the couch at her home. She also reports poor sleep and racing thoughts for the past 2-3 weeks. She also states that she isn't suicidal, and has never been suicidal and has never attempted suicide.      Ms. Groseclose also denies substance abuse or alcohol use, but states that she did use crack for the first time about 2-3 weeks ago. She states she doesn't use regularly but about every 90 days or so.      Currently she endorses being more paranoid, having both audio and visual hallucinations, poor sleep, racing thoughts, worsening depression, and severely increased anxiety. She thought it might be the "Christmas blues" but feels that her medication being stopped is the biggest problem.       She feels that Lithium and Abilify helped her the most. Elements:  Location:  Adult unit. Quality:  chronic. Severity:  moderate. Timing:  worsening over the past 4 months with increasing over the past 2-3 weeks. Duration:  years. Context:  lives alone, minimal support. Associated Signs/Synptoms: Depression Symptoms:  depressed mood, anhedonia, psychomotor retardation, fatigue, feelings of worthlessness/guilt, difficulty concentrating, (Hypo) Manic Symptoms:   Distractibility, Hallucinations, Anxiety Symptoms:  Excessive Worry, Psychotic Symptoms:  Hallucinations: Auditory Visual PTSD Symptoms: NA  Psychiatric Specialty Exam: Physical Exam  ROS  Blood pressure 127/80, pulse 60, temperature 98 F (36.7 C), temperature source Oral, resp. rate 18, height 5\' 3"  (1.6 m), weight 60.328 kg (133 lb).Body mass index is 23.57 kg/(m^2).  General Appearance: Disheveled  Eye Contact::  Good  Speech:  Clear and Coherent  Volume:  Normal  Mood:  Euthymic  Affect:  Congruent  Thought Process:  Disorganized  Orientation:  Full (Time, Place, and Person)  Thought Content:  Hallucinations: Auditory Command:  just talking too her Visual  Suicidal Thoughts:  Yes.  without intent/plan  Homicidal Thoughts:  No  Memory:  NA  Judgement:  Poor  Insight:  Shallow  Psychomotor Activity:  Mannerisms and patient has significant lip smacking consistent with TD  Concentration:  Poor  Recall:  Poor  Akathisia:  No  Handed:  Right  AIMS (if indicated):     Assets:  Desire for Improvement Housing Physical Health Resilience  Sleep:  Number of Hours: 6.25    Past Psychiatric History: Diagnosis:  Hospitalizations:  Outpatient Care:  Substance Abuse Care:  Self-Mutilation:  Suicidal Attempts:  Violent Behaviors:   Past Medical History:   Past Medical History  Diagnosis Date  . Fibromyalgia   . Schizophrenia    None. Allergies:   Allergies  Allergen Reactions  . Penicillins     unknown   PTA Medications: Prescriptions prior to admission  Medication Sig Dispense Refill  . albuterol (PROVENTIL HFA;VENTOLIN HFA) 108 (90 BASE) MCG/ACT inhaler Inhale  2 puffs into the lungs every 4 (four) hours as needed for wheezing or shortness of breath.  1 Inhaler  3  . albuterol (PROVENTIL HFA;VENTOLIN HFA) 108 (90 BASE) MCG/ACT inhaler Inhale 1-2 puffs into the lungs every 6 (six) hours as needed for wheezing.  1 Inhaler  0  . QUEtiapine (SEROQUEL) 50 MG tablet  Take 50 mg by mouth at bedtime.        Previous Psychotropic Medications:  Medication/Dose                 Substance Abuse History in the last 12 months:  yes  Consequences of Substance Abuse: Medical Consequences:  worsening mental health  Social History:  reports that she has been smoking.  She does not have any smokeless tobacco history on file. She reports that she does not drink alcohol or use illicit drugs. Additional Social History:                      Current Place of Residence:   Place of Birth:   Family Members: Marital Status:  Single Children:  Sons:  Daughters: Relationships: Education:  9th grade no GED Educational Problems/Performance: Religious Beliefs/Practices: History of Abuse (Emotional/Phsycial/Sexual) Teacher, music History:  None. Legal History: Hobbies/Interests:  Family History:  History reviewed. No pertinent family history.  Results for orders placed during the hospital encounter of 03/28/13 (from the past 72 hour(s))  COMPREHENSIVE METABOLIC PANEL     Status: Abnormal   Collection Time    03/29/13  6:31 AM      Result Value Range   Sodium 135  135 - 145 mEq/L   Potassium 4.8  3.5 - 5.1 mEq/L   Chloride 102  96 - 112 mEq/L   CO2 27  19 - 32 mEq/L   Glucose, Bld 91  70 - 99 mg/dL   BUN 11  6 - 23 mg/dL   Creatinine, Ser 1.61  0.50 - 1.10 mg/dL   Calcium 9.7  8.4 - 09.6 mg/dL   Total Protein 8.2  6.0 - 8.3 g/dL   Albumin 3.5  3.5 - 5.2 g/dL   AST 045 (*) 0 - 37 U/L   ALT 116 (*) 0 - 35 U/L   Alkaline Phosphatase 95  39 - 117 U/L   Total Bilirubin 0.2 (*) 0.3 - 1.2 mg/dL   GFR calc non Af Amer >90  >90 mL/min   GFR calc Af Amer >90  >90 mL/min   Comment: (NOTE)     The eGFR has been calculated using the CKD EPI equation.     This calculation has not been validated in all clinical situations.     eGFR's persistently <90 mL/min signify possible Chronic Kidney     Disease.     Performed at [facility]   Psychological Evaluations:  Assessment:   DSM5:  Schizophrenia Disorders:  Paranoid schizophrenia Obsessive-Compulsive Disorders:   Trauma-Stressor Disorders:   Substance/Addictive Disorders:  Cocaine abuse Depressive Disorders:    AXIS I:  Paranoid schizophrenia AXIS II:  deferred AXIS III:   Past Medical History  Diagnosis Date  . Fibromyalgia   . Schizophrenia    AXIS IV:  problems with access to health care services and problems with primary support group AXIS V:  31-40 impairment in reality testing  Treatment Plan/Recommendations:   1. Admit for crisis management and stabilization. 2. Medication management to reduce current symptoms to base line and improve the patient's overall level  of functioning. 3. Treat health problems as indicated. 4. Develop treatment plan to decrease risk of relapse upon discharge and to reduce the need for readmission. 5. Psycho-social education regarding relapse prevention and self care. 6. Health care follow up as needed for medical problems. 7. Restart home medications where appropriate.  Treatment Plan Summary: Daily contact with patient to assess and evaluate symptoms and progress in treatment Medication management Current Medications:  Current Facility-Administered Medications  Medication Dose Route Frequency Provider Last Rate Last Dose  . acetaminophen (TYLENOL) tablet 650 mg  650 mg Oral Q6H PRN Earney Navy, NP      . albuterol (PROVENTIL HFA;VENTOLIN HFA) 108 (90 BASE) MCG/ACT inhaler 2 puff  2 puff Inhalation Q4H PRN Earney Navy, NP      . alum & mag hydroxide-simeth (MAALOX/MYLANTA) 200-200-20 MG/5ML suspension 30 mL  30 mL Oral Q4H PRN Earney Navy, NP   30 mL at 03/28/13 2145  . amantadine (SYMMETREL) capsule 100 mg  100 mg Oral BID Drezden Seitzinger   100 mg at 03/29/13 1645  . lithium carbonate (ESKALITH) CR tablet 450 mg  450 mg Oral Q12H Parth Mccormac   450 mg at 03/29/13 1153   . magnesium hydroxide (MILK OF MAGNESIA) suspension 30 mL  30 mL Oral Daily PRN Earney Navy, NP   30 mL at 03/29/13 0629  . nicotine (NICODERM CQ - dosed in mg/24 hours) patch 14 mg  14 mg Transdermal Q0600 Kerry Hough, PA-C   14 mg at 03/29/13 1610  . paliperidone (INVEGA) 24 hr tablet 3 mg  3 mg Oral Daily Jj Enyeart   3 mg at 03/29/13 1153    Observation Level/Precautions:  routine  Laboratory:  reviewed  Psychotherapy:  Individual and group  Medications:  Will restart lithium and abilify  Consultations:  If needed  Discharge Concerns:  Safety, risk for readmission  Estimated LOS:   5-7  Other:     I certify that inpatient services furnished can reasonably be expected to improve the patient's condition.   Rona Ravens. [place] RPAC 5:26 PM 03/29/2013 Seen and agreed. Thedore Mins, MD

## 2013-03-29 NOTE — H&P (Signed)
Psychiatric Admission Assessment Adult  Patient Identification:  Karen Best  Date of Evaluation:  03/29/2013  Chief Complaint:  BIPOLAR  History of Present Illness: This is a 57 year old African-American female. Admitted to [facility] from the [facility] with complaints of restlessness and visual hallucinations. Patient reports, "I stayed 2 days at the [facility] before I was brought to this hospital. I was in this hospital about 2 years ago. I have been trying to get back on my medicines for 4 months. Ms. Gonzella Lex at the [facility] clinic took me off of my Abilify and Lithium. The reason was because I could not focus and I had a lot of bad movements . She decided to put me on Seroquel and another drug to stop the movements. The Seroquel was working, but it kept me at a daze and the movement was still there. My mouth was moving a lot. I could not focus or sleep because my mind would woke me up in the middle of the night. I was suppose to take Seroquel till Dec. 10th, But with the way it did me, I could not make it till December the 10th. That is why I'm here. I have paranoid schizophrenia for many years. I need a place to live. The area I live right now is infested with drugs. I'm an addict. I'm afraid I'm gonna use if I don't move out of this neighborhood. I used crack cocaine 2 weeks ago. I do hear voices at night time. I was seeing this man sitting on my couch".  O: Well developed AA female. Alert, oriented x 3. She is aware of situation. She is a good historian, however, speech is slurred (gabbled) which could be related to the fact that she has lost most of if not all her natural teeth. She seem to have bad case of involuntary movements to mouth areas which could be the reason for the slurred (gabbled) speech. She is able to make her self understood, otherwise.  Elements:  Location:  [facility] adult unit. Quality:  Hallucinations, depression, high anxiety. Severity:  Severe. Timing:   Worsened few weeks ago. Duration:  Chronic. Context:  "I was off my medicine (Abilif and lithium) x 4 months, my doctor took me off of them. Since then I have not been able to handle my mood or my thoughts" .  Associated Signs/Synptoms:  Depression Symptoms:  depressed mood, feelings of worthlessness/guilt, hopelessness, anxiety, loss of energy/fatigue,  (Hypo) Manic Symptoms:  Hallucinations, Irritable Mood,  Anxiety Symptoms:  Excessive Worry,  Psychotic Symptoms:  Hallucinations: Auditory Visual  PTSD Symptoms: Had a traumatic exposure:  None reported  Psychiatric Specialty Exam: Physical Exam  Constitutional: She is oriented to person, place, and time. She appears well-developed.  HENT:  Head: Normocephalic.  Eyes: Pupils are equal, round, and reactive to light.  Neck: Normal range of motion.  Cardiovascular: Normal rate.   Respiratory: Effort normal.  GI: Soft.  Genitourinary:  Did not assess   Musculoskeletal: Normal range of motion.  Neurological: She is alert and oriented to person, place, and time.  Skin: Skin is warm and dry.  Psychiatric: Her behavior is normal. Her mood appears anxious (rated #8). Her speech is slurred. Thought content is paranoid. Cognition and memory are normal. She expresses inappropriate judgment. Depressed: Rated #8. She expresses no homicidal and no suicidal ideation.    Review of Systems  Constitutional: Negative.   HENT: Negative.   Eyes: Negative.   Respiratory: Negative.  Cardiovascular: Negative.   Gastrointestinal:       Lost most of her natural teeth  Genitourinary: Negative.   Musculoskeletal: Negative.   Skin: Negative.   Neurological: Negative.   Psychiatric/Behavioral: Positive for depression (Rated #8), hallucinations (Admitted both auditory/visual) and substance abuse ("Yes, I'm an addict, used last 2 weeks ago, crack cocaine"). Negative for suicidal ideas and memory loss. The patient is nervous/anxious (Rated #9)  and has insomnia.     Blood pressure 127/80, pulse 60, temperature 98 F (36.7 C), temperature source Oral, resp. rate 18, height 5\' 3"  (1.6 m), weight 60.328 kg (133 lb).Body mass index is 23.57 kg/(m^2).  General Appearance: Casual and fairly groomed  Patent attorney::  Good  Speech:  Slurred  Volume:  Normal  Mood:  Anxious and Depressed  Affect:  Depressed and Flat  Thought Process:  Coherent and Logical  Orientation:  Full (Time, Place, and Person)  Thought Content:  Hallucinations: Auditory Visual  Suicidal Thoughts:  No  Homicidal Thoughts:  No  Memory:  Immediate;   Fair Recent;   Fair Remote;   Fair  Judgement:  Impaired  Insight:  Fair  Psychomotor Activity:  Anxiousness  Concentration:  Fair  Recall:  Fair  Akathisia:  No  Handed:  Right  AIMS (if indicated):     Assets:  Communication Skills Desire for Improvement  Sleep:  Number of Hours: 6.25    Past Psychiatric History: Diagnosis: Schizophrenia  Hospitalizations: [facility]  Outpatient Care: [facility]  Substance Abuse Care: None reported  Self-Mutilation: Denies  Suicidal Attempts: Denies attempts and thoughts  Violent Behaviors: None reported   Past Medical History:   Past Medical History  Diagnosis Date  . Fibromyalgia   . Schizophrenia    None.  Allergies:   Allergies  Allergen Reactions  . Penicillins     unknown   PTA Medications: Prescriptions prior to admission  Medication Sig Dispense Refill  . albuterol (PROVENTIL HFA;VENTOLIN HFA) 108 (90 BASE) MCG/ACT inhaler Inhale 2 puffs into the lungs every 4 (four) hours as needed for wheezing or shortness of breath.  1 Inhaler  3  . albuterol (PROVENTIL HFA;VENTOLIN HFA) 108 (90 BASE) MCG/ACT inhaler Inhale 1-2 puffs into the lungs every 6 (six) hours as needed for wheezing.  1 Inhaler  0  . QUEtiapine (SEROQUEL) 50 MG tablet Take 50 mg by mouth at bedtime.        Previous Psychotropic Medications:  Medication/Dose  See medication lists                Substance Abuse History in the last 12 months:  yes  Consequences of Substance Abuse: Medical Consequences:  Liver damage, Possible death by overdose Legal Consequences:  Arrests, jail time, Loss of driving privilege. Family Consequences:  Family discord, divorce and or separation.  Social History:  reports that she has been smoking.  She does not have any smokeless tobacco history on file. She reports that she does not drink alcohol or use illicit drugs.  Additional Social History: Current Place of Residence: [place], [place]    Place of Birth:  [place], [place]  Family Members: "I have 4 daughters"  Marital Status:  Separated  Children: 4  Sons: 0  Daughters: 4  Relationships: Separated  Education:  No high school diploma  Educational Problems/Performance: Did not complete high school and or obtained GED  Religious Beliefs/Practices: NA  History of Abuse (Emotional/Phsycial/Sexual): None reported  Occupational Experiences: Disables  Military History:  None.  Legal History: None  reported  Hobbies/Interests: None reported  Family History:  History reviewed. No pertinent family history.  Results for orders placed during the hospital encounter of 03/28/13 (from the past 72 hour(s))  COMPREHENSIVE METABOLIC PANEL     Status: Abnormal   Collection Time    03/29/13  6:31 AM      Result Value Range   Sodium 135  135 - 145 mEq/L   Potassium 4.8  3.5 - 5.1 mEq/L   Chloride 102  96 - 112 mEq/L   CO2 27  19 - 32 mEq/L   Glucose, Bld 91  70 - 99 mg/dL   BUN 11  6 - 23 mg/dL   Creatinine, Ser 4.09  0.50 - 1.10 mg/dL   Calcium 9.7  8.4 - 81.1 mg/dL   Total Protein 8.2  6.0 - 8.3 g/dL   Albumin 3.5  3.5 - 5.2 g/dL   AST 914 (*) 0 - 37 U/L   ALT 116 (*) 0 - 35 U/L   Alkaline Phosphatase 95  39 - 117 U/L   Total Bilirubin 0.2 (*) 0.3 - 1.2 mg/dL   GFR calc non Af Amer >90  >90 mL/min   GFR calc Af Amer >90  >90 mL/min   Comment: (NOTE)     The eGFR has been  calculated using the CKD EPI equation.     This calculation has not been validated in all clinical situations.     eGFR's persistently <90 mL/min signify possible Chronic Kidney     Disease.     Performed at [facility]   Psychological Evaluations:  Assessment:   DSM5: Schizophrenia Disorders:  Schizophrenia (295.7) Obsessive-Compulsive Disorders:  NA Trauma-Stressor Disorders:  NA Substance/Addictive Disorders:  NA Depressive Disorders:  NA  AXIS I:  Schizophrenia AXIS II:  Deferred AXIS III:   Past Medical History  Diagnosis Date  . Fibromyalgia   . Schizophrenia    AXIS IV:  Chronic mental illness AXIS V:  21-30 behavior considerably influenced by delusions or hallucinations OR serious impairment in judgment, communication OR inability to function in almost all areas  Treatment Plan/Recommendations: 1. Admit for crisis management and stabilization, estimated length of stay 3-5 days.  2. Medication management to reduce current symptoms to base line and improve the patient's overall level of functioning (a). Continue current treatment plan in progress.  3. Treat health problems as indicated.  4. Develop treatment plan to decrease risk of relapse upon discharge and the need for readmission.  5. Psycho-social education regarding relapse prevention and self care.  6. Health care follow up as needed for medical problems.  7. Review, reconcile, and reinstate any pertinent home medications for other health issues where appropriate. 8. Call for consults with hospitalist for any additional specialty patient care services as needed.  Treatment Plan Summary: Daily contact with patient to assess and evaluate symptoms and progress in treatment Medication management  Current Medications:  Current Facility-Administered Medications  Medication Dose Route Frequency Provider Last Rate Last Dose  . acetaminophen (TYLENOL) tablet 650 mg  650 mg Oral Q6H PRN Earney Navy, NP      . albuterol (PROVENTIL HFA;VENTOLIN HFA) 108 (90 BASE) MCG/ACT inhaler 2 puff  2 puff Inhalation Q4H PRN Earney Navy, NP      . alum & mag hydroxide-simeth (MAALOX/MYLANTA) 200-200-20 MG/5ML suspension 30 mL  30 mL Oral Q4H PRN Earney Navy, NP   30 mL at 03/28/13 2145  . amantadine (SYMMETREL) capsule 100  mg  100 mg Oral BID Traveion Ruddock      . lithium carbonate (ESKALITH) CR tablet 450 mg  450 mg Oral Q12H Victorious Kundinger      . magnesium hydroxide (MILK OF MAGNESIA) suspension 30 mL  30 mL Oral Daily PRN Earney Navy, NP   30 mL at 03/29/13 0629  . nicotine (NICODERM CQ - dosed in mg/24 hours) patch 14 mg  14 mg Transdermal Q0600 Kerry Hough, PA-C   14 mg at 03/29/13 1610  . paliperidone (INVEGA) 24 hr tablet 3 mg  3 mg Oral Daily Elzie Knisley        Observation Level/Precautions:  15 minute checks  Laboratory:  Reviewed ED lab findings (elevated liver functions)  Psychotherapy: Group sessions   Medications: See medication lists   Consultations:  As needed  Discharge Concerns:  Stabilization  Estimated LOS: 5-7 days  Other:     I certify that inpatient services furnished can reasonably be expected to improve the patient's condition.   Sanjuana Kava, PMHNP, FNP-BC 12/2/201410:58 AM  Seen and agreed. Thedore Mins, MD
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
## 2013-03-29 NOTE — [facility] Group Notes (Signed)
[facility] LCSW Group Therapy  03/29/2013 , 12:16 PM   Type of Therapy:  Group Therapy  Participation Level:  Active  Participation Quality:  Attentive  Affect:  Appropriate  Cognitive:  Alert  Insight:  Improving  Engagement in Therapy:  Engaged  Modes of Intervention:  Discussion, Exploration and Socialization  Summary of Progress/Problems: Today's group focused on the term Diagnosis.  Participants were asked to define the term, and then pronounce whether it is a negative, positive or neutral term.  "My diagnosis is schizophrenia, but my problem is crack cocaine."  With that, we were off and running with a description of what she does to stay clean, and how trouble always finds her, even when she is trying to avoid it.  Whenever she uses, she feels like she has "given away everything," and is overwhelmed with the prospect of starting over again.  "But as long as you have your life, you have hope."   Very entertaining.  Thoughts are clear.  Mood is Euphoric.  Daryel Gerald B 03/29/2013 , 12:16 PM

## 2013-03-29 NOTE — Progress Notes (Signed)
Patient ID: Karen Best, female   DOB: 01/04/1956, 57 y.o.   MRN: [id number] D: Patient is cooperative and pleasant.  She met with treatment team this morning and indicated that she did not want to return to her living situation.  Patient reported that she has had periods of sobriety from crack cocaine, however, she hangs around "the same people."  Patient continues to have the involuntary movement of her jaw and mouth.  She continues to smack her lips when talking.  Patient's speech is garbled due to having no natural teeth or dentures.  She denies any SI/HI.  She states that she has auditory hallucinations occasionally.  A: continue to monitor medication management and MD orders.  Safety checks completed every 15 minutes per protocol.  R: patient is receptive to staff and her behavior is appropriate.

## 2013-03-29 NOTE — [facility] Suicide Risk Assessment (Signed)
Suicide Risk Assessment  Admission Assessment     Nursing information obtained from:    Demographic factors:    Current Mental Status:    Loss Factors:    Historical Factors:    Risk Reduction Factors:     CLINICAL FACTORS:   Severe Anxiety and/or Agitation Schizophrenia:   Paranoid or undifferentiated type Currently Psychotic  COGNITIVE FEATURES THAT CONTRIBUTE TO RISK:  Closed-mindedness Polarized thinking    SUICIDE RISK:   Minimal: No identifiable suicidal ideation.  Patients presenting with no risk factors but with morbid ruminations; may be classified as minimal risk based on the severity of the depressive symptoms  PLAN OF CARE:1. Admit for crisis management and stabilization. 2. Medication management to reduce current symptoms to base line and improve the     patient's overall level of functioning 3. Treat health problems as indicated. 4. Develop treatment plan to decrease risk of relapse upon discharge and the need for     readmission. 5. Psycho-social education regarding relapse prevention and self care. 6. Health care follow up as needed for medical problems. 7. Restart home medications where appropriate.   I certify that inpatient services furnished can reasonably be expected to improve the patient's condition.  Thedore Mins, MD 03/29/2013, 10:46 AM

## 2013-03-29 NOTE — Tx Team (Signed)
  Interdisciplinary Treatment Plan Update   Date Reviewed:  03/29/2013  Time Reviewed:  8:13 AM  Progress in Treatment:   Attending groups: Yes Participating in groups: Yes Taking medication as prescribed: Yes  Tolerating medication: Yes Family/Significant other contact made: No Patient understands diagnosis: Yes AEB asking for help with getting back on meds, help with "getting focused" Discussing patient identified problems/goals with staff: Yes  See initial care plan Medical problems stabilized or resolved: Yes Denies suicidal/homicidal ideation: Yes  In tx team Patient has not harmed self or others: Yes  For review of initial/current patient goals, please see plan of care.  Estimated Length of Stay:  4-5 days  Reason for Continuation of Hospitalization: Anxiety Hallucinations Medical Issues Other; describe Racing thoughts  New Problems/Goals identified:  N/A  Discharge Plan or Barriers:   return home, follow up at [facility]  Additional Comments: Patient admitted to [facility] for medication stabilization. Patient also complaining of racing thought. Patient is currently followed by [facility] for her medications. Patient had been on abilify and lithium, but has not taken them in 5 weeks. She also states that the seroquel she is taking (1 month) is not working. She has had feelings of "restlessness" and "not feeling like myself." She denies any SI/HI/AVH. Patient denies any ETOH use, however, relapsed on crack cocaine after 8 months of sobriety. She reports delusions of paranoia. She feels someone is watching her while she sleeps, so she sleeps fully clothed.    Attendees:  Signature: Thedore Mins, MD 03/29/2013 8:13 AM   Signature: Richelle Ito, LCSW 03/29/2013 8:13 AM  Signature: Fransisca Kaufmann, NP 03/29/2013 8:13 AM  Signature: Joslyn Devon, RN 03/29/2013 8:13 AM  Signature: Liborio Nixon, RN 03/29/2013 8:13 AM  Signature:  03/29/2013 8:13 AM  Signature:   03/29/2013 8:13 AM  Signature:     Signature:    Signature:    Signature:    Signature:    Signature:      Scribe for Treatment Team:   Richelle Ito, LCSW  03/29/2013 8:13 AM

## 2013-03-30 NOTE — Progress Notes (Signed)
D: Pt denies SI/HI/AVH. Pt is pleasant and cooperative. Pt slept most of the evening.  A: Pt was offered support and encouragement. Pt was given scheduled medications. Pt was encourage to attend groups. Q 15 minute checks were done for safety.   R: Pt is taking medication. Pt has no complaints at this time.Pt receptive to treatment and safety maintained on unit.

## 2013-03-30 NOTE — Progress Notes (Signed)
Adult Psychoeducational Group Note  Date:  03/30/2013 Time:  12:16 PM  Group Topic/Focus:  Dimensions of Wellness:   The focus of this group is to introduce the topic of wellness and discuss the role each dimension of wellness plays in total health.  Participation Level:  Did Not Attend  Participation Quality:  Did Not Attend  Affect:  Did Not Attend  Cognitive:  Did Not Attend  Insight: None  Engagement in Group:  Did Not Attend  Modes of Intervention:  Education  Additional Comments:  Patient did not attend psychoeducation group.  Merleen Milliner 03/30/2013, 12:16 PM

## 2013-03-30 NOTE — Progress Notes (Signed)
The focus of this group is to help patients review their daily goal of treatment and discuss progress on daily workbooks. Pt attended the evening group session but responded minimally to discussion prompts from the Writer. Pt shared that today was a good day "because the good Lord woke me up." Pt reported having no additional needs from Nursing Staff this evening. Pt's affect was appropriate, though she only appeared engaged in the group when being spoken to directly.

## 2013-03-30 NOTE — Progress Notes (Signed)
Patient ID: Karen Best, female   DOB: 1956-03-11, 57 y.o.   MRN: [id number] D: pt. Visible on unit at med window and in room. Pt. Reports no voices and no SHI. A: Writer reviewed pt. Med schedule and encouraged group. R: Pt. Is safe on the unit and attended group.

## 2013-03-30 NOTE — [facility] Group Notes (Signed)
[facility] LCSW Aftercare Discharge Planning Group Note   03/30/2013 11:15 AM  Participation Quality:  Engaged  Mood/Affect:  Flat  Depression Rating:  denies  Anxiety Rating:  denies  Thoughts of Suicide:  No Will you contract for safety?   NA  Current AVH:  No  Plan for Discharge/Comments:  "I'm thankful to be alive today."  Mood good.  States she plans to return to her own place.  "I was just confused yesterday.  That happens when I am not taking my meds."  Wonders when she will be discharged.  Transportation Means: bus  Supports: friends  Sriansh Farra [place], [place] B

## 2013-03-30 NOTE — [facility] Group Notes (Signed)
[facility] Group Therapy  03/30/2013  1:52 PM  Type of Therapy:  [facility] Presentation   Participation Level:  Minimal  Participation Quality:  Inattentive  Affect:  Appropriate  Cognitive:  Appropriate  Insight:  Distracting  Engagement in Therapy:  Distracting  Modes of Intervention:  Discussion, Education and Socialization   Summary of Progress/Problems:  Onalee Hua from [facility] came to present his recovery story and play the guitar.  Zarin listened quietly as the speaker told his story.  She kept on coming in and out of group while the speaker played his guitar.  Did not engage with the speaker.    Simona Huh   03/30/2013  1:52 PM

## 2013-03-30 NOTE — Progress Notes (Signed)
Patient ID: Karen Best, female   DOB: [birth year], 57 y.o.   MRN: [id number] D: patient has been up on the unit interacting with staff and her peers on the unit.  She remains concerned about her involuntary movements of her jaw.  She continues to have repetitive lip smacking.  She is compliant with her medications.  She denies any SI/HI/AVH.  She is eager for discharge and plans on returning to her previous living arrangement.  A: continue to monitor medication management and MD orders.  Safety checks completed every 15 minutes per protocol.  R: patient is receptive to staff and her behavior is appropriate.

## 2013-03-30 NOTE — Progress Notes (Signed)
[facility] MD Progress Note  03/30/2013 3:40 PM Karen Best  MRN:  [id number] Subjective:   Patient states "I didn't sleep for thirty days. I wasn't able to take my medications. I started to see people. It's getting better now because of the medication. I'm getting some sleep at night."  Objective:  Patient has been visible on the unit. She is attending some groups but is noted to wander in and out. Patient is reporting decreasing symptoms of psychosis since being restarting her psych medications. She has enough insight to know her symptoms of psychosis worsen when off medications. The patient has a long history of paranoid schizophrenia.   Diagnosis:   DSM5: Schizophrenia Disorders: Paranoid schizophrenia  Obsessive-Compulsive Disorders:  Trauma-Stressor Disorders:  Substance/Addictive Disorders: Cocaine abuse  Depressive Disorders:  AXIS I: Paranoid schizophrenia  AXIS II: deferred  AXIS III:  Past Medical History   Diagnosis  Date   .  Fibromyalgia    .  Schizophrenia     AXIS IV: problems with access to health care services and problems with primary support group  AXIS V: 31-40 impairment in reality testing   ADL's:  Intact  Sleep: Fair  Appetite:  Good  Suicidal Ideation:  Passive SI no plan Homicidal Ideation:  Denies AEB (as evidenced by):  Psychiatric Specialty Exam: Review of Systems  Constitutional: Negative.   HENT: Negative.   Eyes: Negative.   Respiratory: Negative.   Cardiovascular: Negative.   Gastrointestinal: Negative.   Genitourinary: Negative.   Musculoskeletal: Negative.   Skin: Negative.   Neurological: Negative.   Endo/Heme/Allergies: Negative.   Psychiatric/Behavioral: Positive for depression.    Blood pressure 107/77, pulse 84, temperature 97.4 F (36.3 C), temperature source Oral, resp. rate 18, height 5\' 3"  (1.6 m), weight 60.328 kg (133 lb).Body mass index is 23.57 kg/(m^2).  General Appearance: Disheveled  Eye Solicitor::  Fair   Speech:  Clear and Coherent  Volume:  Normal  Mood:  Euthymic  Affect:  Congruent  Thought Process:  Disorganized  Orientation:  Full (Time, Place, and Person)  Thought Content:  Hallucinations: Auditory Visual  Suicidal Thoughts:  Yes.  without intent/plan  Homicidal Thoughts:  No  Memory:  Immediate;   Fair Recent;   Fair Remote;   Fair  Judgement:  Poor  Insight:  Shallow  Psychomotor Activity:  Normal and Patient noted by multiple staff to be lip smacking that appears to be consistent with TD.  Concentration:  Poor  Recall:  Poor  Akathisia:  No  Handed:  Right  AIMS (if indicated):     Assets:  Desire for Improvement Housing Physical Health Resilience  Sleep:  Number of Hours: 5.5   Current Medications: Current Facility-Administered Medications  Medication Dose Route Frequency Provider Last Rate Last Dose  . acetaminophen (TYLENOL) tablet 650 mg  650 mg Oral Q6H PRN Earney Navy, NP      . albuterol (PROVENTIL HFA;VENTOLIN HFA) 108 (90 BASE) MCG/ACT inhaler 2 puff  2 puff Inhalation Q4H PRN Earney Navy, NP      . alum & mag hydroxide-simeth (MAALOX/MYLANTA) 200-200-20 MG/5ML suspension 30 mL  30 mL Oral Q4H PRN Earney Navy, NP   30 mL at 03/30/13 0135  . amantadine (SYMMETREL) capsule 100 mg  100 mg Oral BID Mojeed Akintayo   100 mg at 03/30/13 0745  . benztropine (COGENTIN) tablet 1 mg  1 mg Oral BID Verne Spurr, PA-C   1 mg at 03/30/13 0745  . lithium carbonate (ESKALITH)  CR tablet 450 mg  450 mg Oral Q12H Mojeed Akintayo   450 mg at 03/30/13 0745  . magnesium hydroxide (MILK OF MAGNESIA) suspension 30 mL  30 mL Oral Daily PRN Earney Navy, NP   30 mL at 03/29/13 0629  . nicotine (NICODERM CQ - dosed in mg/24 hours) patch 14 mg  14 mg Transdermal Q0600 Kerry Hough, PA-C   14 mg at 03/29/13 4132  . paliperidone (INVEGA) 24 hr tablet 3 mg  3 mg Oral Daily Mojeed Akintayo   3 mg at 03/30/13 0745    Lab Results:  Results for orders placed  during the hospital encounter of 03/28/13 (from the past 48 hour(s))  COMPREHENSIVE METABOLIC PANEL     Status: Abnormal   Collection Time    03/29/13  6:31 AM      Result Value Range   Sodium 135  135 - 145 mEq/L   Potassium 4.8  3.5 - 5.1 mEq/L   Chloride 102  96 - 112 mEq/L   CO2 27  19 - 32 mEq/L   Glucose, Bld 91  70 - 99 mg/dL   BUN 11  6 - 23 mg/dL   Creatinine, Ser 4.40  0.50 - 1.10 mg/dL   Calcium 9.7  8.4 - 10.2 mg/dL   Total Protein 8.2  6.0 - 8.3 g/dL   Albumin 3.5  3.5 - 5.2 g/dL   AST 725 (*) 0 - 37 U/L   ALT 116 (*) 0 - 35 U/L   Alkaline Phosphatase 95  39 - 117 U/L   Total Bilirubin 0.2 (*) 0.3 - 1.2 mg/dL   GFR calc non Af Amer >90  >90 mL/min   GFR calc Af Amer >90  >90 mL/min   Comment: (NOTE)     The eGFR has been calculated using the CKD EPI equation.     This calculation has not been validated in all clinical situations.     eGFR's persistently <90 mL/min signify possible Chronic Kidney     Disease.     Performed at [facility]    Physical Findings: AIMS: Facial and Oral Movements Muscles of Facial Expression: Mild Lips and Perioral Area: Moderate Jaw: Moderate Tongue: Moderate,Extremity Movements Upper (arms, wrists, hands, fingers): Mild Lower (legs, knees, ankles, toes): None, normal, Trunk Movements Neck, shoulders, hips: None, normal, Overall Severity Severity of abnormal movements (highest score from questions above): Moderate Incapacitation due to abnormal movements: None, normal Patient's awareness of abnormal movements (rate only patient's report): Aware, no distress, Dental Status Current problems with teeth and/or dentures?: Yes Does patient usually wear dentures?: Yes  CIWA:    COWS:     Treatment Plan Summary: Daily contact with patient to assess and evaluate symptoms and progress in treatment Medication management  Plan: Continue crisis management and stabilization.  Medication management: Reviewed with patient  who stated no untoward effects. Continue Lithium for improved mood stability, Invega for psychosis, Amantadine to address symptoms of TD. Seroquel was stopped due to concerns that it might worsen her TD.  Encouraged patient to attend groups and participate in group counseling sessions and activities.  Discharge plan in progress.  Continue current treatment plan.  Address health issues: Vitals reviewed and stable.   Medical Decision Making Problem Points:  Established problem, stable/improving (1) and Review of psycho-social stressors (1) Data Points:  Review of medication regiment & side effects (2)  I certify that inpatient services furnished can reasonably be expected to improve the patient's  condition.   Ladeidra Borys NP-C 03/30/2013, 3:40 PM

## 2013-03-31 MED ORDER — PALIPERIDONE ER 6 MG PO TB24
6.0000 mg | ORAL_TABLET | Freq: Every day | ORAL | Status: DC
  Administered 2013-04-01: 6 mg via ORAL
  Filled 2013-03-31: qty 3
  Filled 2013-03-31 (×2): qty 1

## 2013-03-31 NOTE — Tx Team (Signed)
  Interdisciplinary Treatment Plan Update   Date Reviewed:  03/31/2013  Time Reviewed:  10:22 AM  Progress in Treatment:   Attending groups: Yes Participating in groups: Yes Taking medication as prescribed: Yes  Tolerating medication: Yes Family/Significant other contact made: Yes  Patient understands diagnosis: Yes  Discussing patient identified problems/goals with staff: Yes Medical problems stabilized or resolved: Yes Denies suicidal/homicidal ideation: Yes Patient has not harmed self or others: Yes  For review of initial/current patient goals, please see plan of care.  Estimated Length of Stay:  Likely d/c tomorrow  Reason for Continuation of Hospitalization:   New Problems/Goals identified:  N/A  Discharge Plan or Barriers:   return home, follow up [facility]  Additional Comments:  Attendees:  Signature: Thedore Mins, MD 03/31/2013 10:22 AM   Signature: Richelle Ito, LCSW 03/31/2013 10:22 AM  Signature: Fransisca Kaufmann, NP 03/31/2013 10:22 AM  Signature: Joslyn Devon, RN 03/31/2013 10:22 AM  Signature: Liborio Nixon, RN 03/31/2013 10:22 AM  Signature:  03/31/2013 10:22 AM  Signature:   03/31/2013 10:22 AM  Signature:    Signature:    Signature:    Signature:    Signature:    Signature:      Scribe for Treatment Team:   Richelle Ito, LCSW  03/31/2013 10:22 AM

## 2013-03-31 NOTE — Progress Notes (Signed)
Patient ID: Karen Best, female   DOB: 06-22-1955, 57 y.o.   MRN: [id number] [facility] MD Progress Note  03/31/2013 11:17 AM Karen Best  MRN:  [id number] Subjective:  "I have been doing well except for hearing voices occasionally." Objective: Patient states that she has been sleeping better and participating in the unit activities. She also reports decreased mood, swings, racing thoughts, paranoia, visual and auditory hallucinations. She denies suicidal/homicidal ideations, intent or plan. Patient is compliant with her medications and has not reported any adverse reactions.  Diagnosis:   DSM5: Schizophrenia Disorders: Paranoid schizophrenia  Obsessive-Compulsive Disorders:  Trauma-Stressor Disorders:  Substance/Addictive Disorders: Cocaine abuse  Depressive Disorders:  AXIS I: Paranoid schizophrenia  AXIS II: deferred  AXIS III:  Past Medical History   Diagnosis  Date   .  Fibromyalgia    .  Schizophrenia     AXIS IV: problems with access to health care services and problems with primary support group  AXIS V: GAF 50-60  ADL's:  Intact  Sleep: Fair  Appetite:  Good  Suicidal Ideation:  Passive SI no plan Homicidal Ideation:  Denies AEB (as evidenced by):  Psychiatric Specialty Exam: Review of Systems  Constitutional: Negative.   HENT: Negative.   Eyes: Negative.   Respiratory: Negative.   Cardiovascular: Negative.   Gastrointestinal: Negative.   Genitourinary: Negative.   Musculoskeletal: Negative.   Skin: Negative.   Neurological: Negative.   Endo/Heme/Allergies: Negative.   Psychiatric/Behavioral: Positive for depression and hallucinations.    Blood pressure 106/73, pulse 116, temperature 98 F (36.7 C), temperature source Oral, resp. rate 18, height 5\' 3"  (1.6 m), weight 60.328 kg (133 lb).Body mass index is 23.57 kg/(m^2).  General Appearance: fairly groomed  Patent attorney::  Fair  Speech:  Clear and Coherent  Volume:  Normal  Mood:  Euthymic  Affect:   Congruent  Thought Process:  Disorganized  Orientation:  Full (Time, Place, and Person)  Thought Content:  Hallucinations: Auditory Visual  Suicidal Thoughts:  denies  Homicidal Thoughts:  No  Memory:  Immediate;   Fair Recent;   Fair Remote;   Fair  Judgement:  marginal  Insight:  Shallow  Psychomotor Activity:  Normal and Patient noted by multiple staff to be lip smacking that appears to be consistent with TD.  Concentration:  fair  Recall:  fair  Akathisia:  No  Handed:  Right  AIMS (if indicated):     Assets:  Desire for Improvement Housing Physical Health Resilience  Sleep:  Number of Hours: 6.5   Current Medications: Current Facility-Administered Medications  Medication Dose Route Frequency Provider Last Rate Last Dose  . acetaminophen (TYLENOL) tablet 650 mg  650 mg Oral Q6H PRN Earney Navy, NP      . albuterol (PROVENTIL HFA;VENTOLIN HFA) 108 (90 BASE) MCG/ACT inhaler 2 puff  2 puff Inhalation Q4H PRN Earney Navy, NP      . alum & mag hydroxide-simeth (MAALOX/MYLANTA) 200-200-20 MG/5ML suspension 30 mL  30 mL Oral Q4H PRN Earney Navy, NP   30 mL at 03/31/13 0755  . amantadine (SYMMETREL) capsule 100 mg  100 mg Oral BID Tomasz Steeves   100 mg at 03/31/13 0754  . benztropine (COGENTIN) tablet 1 mg  1 mg Oral BID Verne Spurr, PA-C   1 mg at 03/31/13 0754  . lithium carbonate (ESKALITH) CR tablet 450 mg  450 mg Oral Q12H Maxie Debose   450 mg at 03/31/13 0754  . magnesium hydroxide (MILK OF MAGNESIA)  suspension 30 mL  30 mL Oral Daily PRN Earney Navy, NP   30 mL at 03/29/13 0629  . nicotine (NICODERM CQ - dosed in mg/24 hours) patch 14 mg  14 mg Transdermal Q0600 Kerry Hough, PA-C   14 mg at 03/31/13 0754  . [START ON 04/01/2013] paliperidone (INVEGA) 24 hr tablet 6 mg  6 mg Oral Daily Stpehen Petitjean        Lab Results:  No results found for this or any previous visit (from the past 48 hour(s)).  Physical Findings: AIMS: Facial and  Oral Movements Muscles of Facial Expression: Mild Lips and Perioral Area: Moderate Jaw: Moderate Tongue: Moderate,Extremity Movements Upper (arms, wrists, hands, fingers): Mild Lower (legs, knees, ankles, toes): None, normal, Trunk Movements Neck, shoulders, hips: None, normal, Overall Severity Severity of abnormal movements (highest score from questions above): Moderate Incapacitation due to abnormal movements: None, normal Patient's awareness of abnormal movements (rate only patient's report): Aware, no distress, Dental Status Current problems with teeth and/or dentures?: Yes Does patient usually wear dentures?: Yes  CIWA:    COWS:     Treatment Plan Summary: Daily contact with patient to assess and evaluate symptoms and progress in treatment Medication management  Plan: Continue crisis management and stabilization.  Medication management: Reviewed with patient who stated no untoward effects. Continue Lithium for improved mood stability, Invega for psychosis, Amantadine to address symptoms of TD. Seroquel was stopped due to concerns that it might worsen her TD.  Encouraged patient to attend groups and participate in group counseling sessions and activities.  Discharge plan in progress.  Continue current treatment plan.  Address health issues: Vitals reviewed and stable.  Increase Invega to 6mg  po daily for delusions/psychosis. Lithium level on 04/01/13. Medical Decision Making Problem Points:  Established problem, stable/improving (1) and Review of psycho-social stressors (1) Data Points:  Review of medication regiment & side effects (2)  I certify that inpatient services furnished can reasonably be expected to improve the patient's condition.   Thedore Mins, MD 03/31/2013, 11:17 AM

## 2013-03-31 NOTE — Progress Notes (Signed)
Patient ID: Karen Best, female   DOB: [birth year], 56 y.o.   MRN: [id number] D: Pt is awake and active on the unit this PM. Pt endorses AVH, but denies SI/HI. Pt seems to be preoccupied or responding to internal stimuli. Pt mood is depressed and her affect is flat/blunted. Pt shuffles feet and her movement is irregular, but is stable in her feet. Pt is pleasant with staff and peers and participates in the milieu.    A: Encouraged pt to discuss feelings with staff and administered medication per MD orders. Writer also encouraged pt to participate in groups.  R: Writer will continue to monitor. 15 minute checks are ongoing for safety.

## 2013-03-31 NOTE — Progress Notes (Signed)
[facility] Adult Case Management Discharge Plan :  Will you be returning to the same living situation after discharge: Yes,  home At discharge, do you have transportation home?:Yes,  friend Do you have the ability to pay for your medications:Yes,  MCD  Release of information consent forms completed and in the chart;  Patient's signature needed at discharge.  Patient to Follow up at: Follow-up Information   Follow up with [facility] On 04/06/2013. (Go to your appointment on Wed.)    Contact information:   [address]  [place]  [336] [phone]      Patient denies SI/HI:   Yes,  yes    Safety Planning and Suicide Prevention discussed:  Yes,  yes  Ida Rogue 03/31/2013, 10:29 AM

## 2013-03-31 NOTE — Progress Notes (Signed)
Seen and agreed. Rion Catala, MD 

## 2013-03-31 NOTE — [facility] Group Notes (Signed)
[facility] Group Notes:  (Counselor/Nursing/MHT/Case Management/Adjunct)  03/31/2013 1:15PM  Type of Therapy:  Group Therapy  Participation Level:  Active  Participation Quality:  Appropriate  Affect:  Flat  Cognitive:  Oriented  Insight:  Improving  Engagement in Group:  Limited  Engagement in Therapy:  Limited  Modes of Intervention:  Discussion, Exploration and Socialization  Summary of Progress/Problems: The topic for group was balance in life.  Pt participated in the discussion about when their life was in balance and out of balance and how this feels.  Pt discussed ways to get back in balance and short term goals they can work on to get where they want to be. Johnni admitted that she was unbalanced when she first came in, but is now balanced again.  "Every day is balanced because I wake up and am thankful for another day."  Stormee was invested in not only giving another patient tips about parenting, but sharing her own stories of parenting as well.     Daryel Gerald B 03/31/2013 1:34 PM

## 2013-04-01 DIAGNOSIS — F259 Schizoaffective disorder, unspecified: Principal | ICD-10-CM

## 2013-04-01 LAB — LITHIUM LEVEL: Lithium Lvl: 0.78 mEq/L — ABNORMAL LOW (ref 0.80–1.40)

## 2013-04-01 MED ORDER — AMANTADINE HCL 100 MG PO CAPS: 100.0000 mg | ORAL_CAPSULE | Freq: Two times a day (BID) | ORAL | Status: DC

## 2013-04-01 MED ORDER — LITHIUM CARBONATE ER 450 MG PO TBCR: 450.0000 mg | EXTENDED_RELEASE_TABLET | Freq: Two times a day (BID) | ORAL | Status: DC

## 2013-04-01 MED ORDER — PALIPERIDONE ER 6 MG PO TB24: 6.0000 mg | ORAL_TABLET | Freq: Every day | ORAL | Status: DC

## 2013-04-01 MED ORDER — BENZTROPINE MESYLATE 1 MG PO TABS: 1.0000 mg | ORAL_TABLET | Freq: Two times a day (BID) | ORAL | Status: DC

## 2013-04-01 NOTE — Progress Notes (Signed)
Patient ID: Karen Best, female   DOB: [birth year], 57 y.o.   MRN: [id number] D: Pt. Lying supine, eyes closed, respirations even. A: Writer assess for s/s of distress. Staff will monitor q15nmin for safety. R: pt. Is safe on the unit, no distress noted.

## 2013-04-01 NOTE — [facility] Suicide Risk Assessment (Signed)
Suicide Risk Assessment  Discharge Assessment     Demographic Factors:  Low socioeconomic status, Unemployed and [place] American female  Mental Status Per Nursing Assessment::   On Admission:     Current Mental Status by Physician: patient denies suicidal ideation, intent or plan  Loss Factors: Decrease in vocational status and Financial problems/change in socioeconomic status  Historical Factors: Family history of mental illness or substance abuse  Risk Reduction Factors:   Sense of responsibility to family, Living with another person, especially a relative and Positive social support  Continued Clinical Symptoms:  Resolving delusions and psychosis  Cognitive Features That Contribute To Risk:  Closed-mindedness Polarized thinking    Suicide Risk:  Minimal: No identifiable suicidal ideation.  Patients presenting with no risk factors but with morbid ruminations; may be classified as minimal risk based on the severity of the depressive symptoms  Discharge Diagnoses:   AXIS I:  Schizoaffective disorder  AXIS II:  Deferred AXIS III:   Past Medical History  Diagnosis Date  . Fibromyalgia   . Schizophrenia    AXIS IV:  economic problems, other psychosocial or environmental problems and problems related to social environment AXIS V:  61-70 mild symptoms  Plan Of Care/Follow-up recommendations:  Activity:  as tolerated Diet:  healthy Tests:  Lithium level Other:  patient to keep her after care appointment  Is patient on multiple antipsychotic therapies at discharge:  No   Has Patient had three or more failed trials of antipsychotic monotherapy by history:  No  Recommended Plan for Multiple Antipsychotic Therapies: NA  Maija Biggers,MD 04/01/2013, 10:22 AM

## 2013-04-01 NOTE — Discharge Summary (Signed)
Physician Discharge Summary Note  Patient:  Karen Best is an 57 y.o., female MRN:  [id number] DOB:  1956/02/21 Patient phone:  [phone] (home)  Patient address:   [address] [place] [address],   Date of Admission:  03/28/2013 Date of Discharge: 04/01/13  Reason for Admission:  Psychosis   Discharge Diagnoses: Principal Problem:   Schizoaffective schizophrenia Active Problems:   Schizophrenia  Review of Systems  Constitutional: Negative.   HENT: Negative.   Eyes: Negative.   Respiratory: Negative.   Cardiovascular: Negative.   Gastrointestinal: Negative.   Genitourinary: Negative.   Musculoskeletal: Negative.   Skin: Negative.   Neurological: Negative.   Endo/Heme/Allergies: Negative.   Psychiatric/Behavioral: Negative for depression, suicidal ideas, hallucinations, memory loss and substance abuse. The patient is not nervous/anxious and does not have insomnia.     DSM5:  AXIS I: Schizoaffective disorder  AXIS II: Deferred  AXIS III:  Past Medical History   Diagnosis  Date   .  Fibromyalgia    .  Schizophrenia     AXIS IV: economic problems, other psychosocial or environmental problems and problems related to social environment  AXIS V: 61-70 mild symptoms  Level of Care:  OP  Hospital Course:  Karen Best is a 57 year old AAF who presented to the [facility] with family member stating that her medications needed to be adjusted. She has history of paranoid schizophrenia stating she was diagnosed in her 75's. She is currently followed by [facility] and sees Dr. Marlinda Mike there. She was hoping to stretch out the time between her follow up appointments at [facility] to every 6 weeks, but she has been off of her current medications for 4 weeks and does not feel that she can wait until December the 10th for her follow up appointment. She states she is seeing a man sitting on the couch at her home. She also reports poor sleep and racing thoughts for the past  2-3 weeks. She also states that she isn't suicidal, and has never been suicidal and has never attempted suicide. Karen Best also denies substance abuse or alcohol use, but states that she did use crack for the first time about 2-3 weeks ago. She states she doesn't use regularly but about every 90 days or so. Currently she endorses being more paranoid, having both audio and visual hallucinations, poor sleep, racing thoughts, worsening depression, and severely increased anxiety. She thought it might be the "Christmas blues" but feels that her medication being stopped is the biggest problem. She feels that Lithium and Abilify helped her the most.         Karen Best was admitted to the adult unit where she was evaluated and her symptoms were identified. Medication management was discussed and implemented. Patient was started on Lithium 450 mg every twelve hours for mood stability, Invega 6 mg Daily for psychosis. Patient was noted to have symptoms of TD which included repetitive mouth movements. Patient was started on amantadine scheduled to help with these symptoms.  She was encouraged to participate in unit programming. Medical problems were identified and treated appropriately. Home medication was restarted as needed.  Karen Best was evaluated each day by a clinical provider to ascertain the patient's response to treatment.  Improvement was noted by the patient's report of decreasing symptoms, improved sleep and appetite, affect, medication tolerance, behavior, and participation in unit programming.  Karen Best was asked each day to complete a self inventory noting mood, mental  status, pain, new symptoms, anxiety and concerns.         She responded well to medication and being in a therapeutic and supportive environment. Positive and appropriate behavior was noted and the patient was motivated for recovery.  Karen Best worked closely with the treatment team and case manager to develop a  discharge plan with appropriate goals. Coping skills, problem solving as well as relaxation therapies were also part of the unit programming.         By the day of discharge Karen Best was in much improved condition than upon admission. Symptoms were reported as significantly decreased or resolved completely.  The patient denied SI/HI and voiced no AVH. She was motivated to continue taking medication with a goal of continued improvement in mental health.          Karen Best was discharged home with a plan to follow up as noted below. She was made an appointment prior to discharge to follow up with a Primary Care Provider for elevated liver enzymes that were noted during hospital stay.   Consults:  psychiatry  Significant Diagnostic Studies:  Admission labs completed   Discharge Vitals:   Blood pressure 125/79, pulse 99, temperature 97.8 F (36.6 C), temperature source Oral, resp. rate 17, height 5\' 3"  (1.6 m), weight 60.328 kg (133 lb). Body mass index is 23.57 kg/(m^2). Lab Results:   Results for orders placed during the hospital encounter of 03/28/13 (from the past 72 hour(s))  LITHIUM LEVEL     Status: Abnormal   Collection Time    04/01/13  6:20 AM      Result Value Range   Lithium Lvl 0.78 (*) 0.80 - 1.40 mEq/L   Comment: Performed at [facility]    Physical Findings: AIMS: Facial and Oral Movements Muscles of Facial Expression: Mild Lips and Perioral Area: Moderate Jaw: Moderate Tongue: Moderate,Extremity Movements Upper (arms, wrists, hands, fingers): Mild Lower (legs, knees, ankles, toes): None, normal, Trunk Movements Neck, shoulders, hips: None, normal, Overall Severity Severity of abnormal movements (highest score from questions above): Moderate Incapacitation due to abnormal movements: None, normal Patient's awareness of abnormal movements (rate only patient's report): Aware, no distress, Dental Status Current problems with teeth and/or dentures?:  Yes Does patient usually wear dentures?: Yes  CIWA:    COWS:     Psychiatric Specialty Exam: See Psychiatric Specialty Exam and Suicide Risk Assessment completed by Attending Physician prior to discharge.  Discharge destination:  Home  Is patient on multiple antipsychotic therapies at discharge:  No   Has Patient had three or more failed trials of antipsychotic monotherapy by history:  No  Recommended Plan for Multiple Antipsychotic Therapies: NA     Medication List    STOP taking these medications       QUEtiapine 50 MG tablet  Commonly known as:  SEROQUEL      TAKE these medications     Indication   albuterol 108 (90 BASE) MCG/ACT inhaler  Commonly known as:  PROVENTIL HFA;VENTOLIN HFA  Inhale 1-2 puffs into the lungs every 6 (six) hours as needed for wheezing.      amantadine 100 MG capsule  Commonly known as:  SYMMETREL  Take 1 capsule (100 mg total) by mouth 2 (two) times daily.   Indication:  Extrapyramidal Reaction caused by Medications     benztropine 1 MG tablet  Commonly known as:  COGENTIN  Take 1 tablet (1 mg total) by mouth 2 (two) times  daily.   Indication:  Extrapyramidal Reaction caused by Medications     lithium carbonate 450 MG CR tablet  Commonly known as:  ESKALITH  Take 1 tablet (450 mg total) by mouth every 12 (twelve) hours.   Indication:  Manic-Depression     paliperidone 6 MG 24 hr tablet  Commonly known as:  INVEGA  Take 1 tablet (6 mg total) by mouth daily.   Indication:  Schizoaffective Disorder       Follow-up Information   Follow up with [facility] On 04/06/2013. (Go to your appointment on Wed.)    Contact information:   [address]  [name]  [phone]      Follow up with [facility] [place] On 04/11/2013. (Monday at 1:30PM)    Contact information:   [address]   [336] [phone]      Follow-up recommendations:   Activity: as tolerated  Diet: healthy  Tests: Lithium level  Other: patient to keep her  after care appointment   Comments:   Take all your medications as prescribed by your mental healthcare provider.  Report any adverse effects and or reactions from your medicines to your outpatient provider promptly.  Patient is instructed and cautioned to not engage in alcohol and or illegal drug use while on prescription medicines.  In the event of worsening symptoms, patient is instructed to call the crisis hotline, 911 and or go to the nearest ED for appropriate evaluation and treatment of symptoms.  Follow-up with your primary care provider for your other medical issues, concerns and or health care needs.   Total Discharge Time:  Greater than 30 minutes.  SignedFransisca Kaufmann NP-C 04/01/2013, 3:01 PM

## 2013-04-01 NOTE — Progress Notes (Signed)
Pt discharged per MD orders; pt currently denies SI/HI and auditory/visual hallucinations; pt was given education by RN regarding follow-up appointments and medications and pt denied any questions or concerns about these instructions; pt was then escorted to search room to retrieve her belongings by RN before being discharged to hospital lobby. 

## 2013-04-04 NOTE — Discharge Summary (Signed)
Seen and agreed. Norbert Malkin, MD 

## 2013-04-06 NOTE — Progress Notes (Signed)
Patient Discharge Instructions:  After Visit Summary (AVS):   Faxed to:  04/06/13 Discharge Summary Note:   Faxed to:  04/06/13 Psychiatric Admission Assessment Note:   Faxed to:  04/06/13 Suicide Risk Assessment - Discharge Assessment:   Faxed to:  04/06/13 Faxed/Sent to the Next Level Care provider:  04/06/13 Faxed to [facility] @ [phone] Faxed to [facility] @ [phone]  Jerelene Redden, 04/06/2013, 2:18 PM

## 2013-05-17 ENCOUNTER — Encounter: Payer: Self-pay | Admitting: Gastroenterology

## 2013-06-17 ENCOUNTER — Encounter: Payer: Self-pay | Admitting: *Deleted

## 2013-06-20 ENCOUNTER — Ambulatory Visit: Payer: Self-pay | Admitting: Gastroenterology

## 2013-07-26 ENCOUNTER — Encounter (HOSPITAL_COMMUNITY): Payer: Self-pay | Admitting: Emergency Medicine

## 2013-07-26 ENCOUNTER — Emergency Department (HOSPITAL_COMMUNITY)
Admission: EM | Admit: 2013-07-26 | Discharge: 2013-07-26 | Discharge status: Against Medical Advice | Payer: Medicaid Other | Attending: Emergency Medicine | Admitting: Emergency Medicine

## 2013-07-26 DIAGNOSIS — F209 Schizophrenia, unspecified: Secondary | ICD-10-CM | POA: Insufficient documentation

## 2013-07-26 DIAGNOSIS — Z79899 Other long term (current) drug therapy: Secondary | ICD-10-CM | POA: Insufficient documentation

## 2013-07-26 DIAGNOSIS — H53149 Visual discomfort, unspecified: Secondary | ICD-10-CM | POA: Insufficient documentation

## 2013-07-26 DIAGNOSIS — J45909 Unspecified asthma, uncomplicated: Secondary | ICD-10-CM | POA: Insufficient documentation

## 2013-07-26 DIAGNOSIS — R519 Headache, unspecified: Secondary | ICD-10-CM

## 2013-07-26 DIAGNOSIS — Z880 Allergy status to penicillin: Secondary | ICD-10-CM | POA: Insufficient documentation

## 2013-07-26 DIAGNOSIS — F319 Bipolar disorder, unspecified: Secondary | ICD-10-CM | POA: Insufficient documentation

## 2013-07-26 DIAGNOSIS — IMO0002 Reserved for concepts with insufficient information to code with codable children: Secondary | ICD-10-CM | POA: Insufficient documentation

## 2013-07-26 DIAGNOSIS — R51 Headache: Secondary | ICD-10-CM | POA: Insufficient documentation

## 2013-07-26 DIAGNOSIS — F17200 Nicotine dependence, unspecified, uncomplicated: Secondary | ICD-10-CM | POA: Insufficient documentation

## 2013-07-26 DIAGNOSIS — Z8739 Personal history of other diseases of the musculoskeletal system and connective tissue: Secondary | ICD-10-CM | POA: Insufficient documentation

## 2013-07-26 MED ORDER — IBUPROFEN 200 MG PO TABS
600.0000 mg | ORAL_TABLET | Freq: Once | ORAL | Status: AC
  Administered 2013-07-26: 600 mg via ORAL
  Filled 2013-07-26: qty 3

## 2013-07-26 NOTE — ED Provider Notes (Signed)
Medical screening examination/treatment/procedure(s) were performed by non-physician practitioner and as supervising physician I was immediately available for consultation/collaboration.  Manpreet Kemmer L Maybell Misenheimer, MD 07/26/13 2330 

## 2013-07-26 NOTE — ED Notes (Signed)
Pt states that she did some crack 2 days ago that caused her head to hurt.  Denies NVD.  C/o headache since then.

## 2013-07-26 NOTE — ED Provider Notes (Signed)
CSN: [id number]     Arrival date & time 07/26/13  1633 History  This chart was scribed for non-physician practitioner, Alecia Lemming, PA-C,working with Richarda Blade, MD, by Marlowe Kays, ED Scribe.  This patient was seen in room [facility] and the patient's care was started at 6:46 PM.  Chief Complaint  Patient presents with  . Headache   The history is provided by the patient. No language interpreter was used.   HPI Comments:  Karen Best is a 58 y.o. female with h/o schizophrenia, who presents to the Emergency Department complaining of sudden onset HA that started two days ago while she was smoking crack. She reports the pain is a constant soreness localized to her left temporal aspect of her head. She reports associated photophobia. She denies taking anything for the pain. Pt states she saw her PCP four days ago and is supposed to follow up in three months. It is unclear if she saw her PCP for this specific symptom. She denies taking anticoagulants. She denies weakness of the BLE, nausea, vomiting, visual loss or visual changes, CP, jaw pain. She [facility]. She denies allergies to any medications.  Past Medical History  Diagnosis Date  . Fibromyalgia   . Schizophrenia   . Asthma   . Bipolar 1 disorder   . Tobacco abuse    Past Surgical History  Procedure Laterality Date  . Cesarean section    . Tubal ligation     No family history on file. History  Substance Use Topics  . Smoking status: Current Every Day Smoker  . Smokeless tobacco: Not on file  . Alcohol Use: No     Comment: Patient denies    OB History   Grav Para Term Preterm Abortions TAB SAB Ect Mult Living                 Review of Systems  Constitutional: Negative for fever.  HENT: Negative for congestion, dental problem, rhinorrhea and sinus pressure.   Eyes: Positive for photophobia. Negative for discharge, redness and visual disturbance.  Respiratory: Negative for  shortness of breath.   Cardiovascular: Negative for chest pain.  Gastrointestinal: Negative for nausea and vomiting.  Musculoskeletal: Negative for gait problem, neck pain and neck stiffness.  Skin: Negative for rash.  Neurological: Positive for headaches. Negative for syncope, speech difficulty, weakness, light-headedness and numbness.  Psychiatric/Behavioral: Negative for confusion.    Allergies  Penicillins  Home Medications   Current Outpatient Rx  Name  Route  Sig  Dispense  Refill  . albuterol (PROVENTIL HFA;VENTOLIN HFA) 108 (90 BASE) MCG/ACT inhaler   Inhalation   Inhale 1-2 puffs into the lungs every 6 (six) hours as needed for wheezing.   1 Inhaler   0   . amantadine (SYMMETREL) 100 MG capsule   Oral   Take 1 capsule (100 mg total) by mouth 2 (two) times daily.   60 capsule   0   . beclomethasone (QVAR) 40 MCG/ACT inhaler   Inhalation   Inhale 2 puffs into the lungs 2 (two) times daily.         . paliperidone (INVEGA) 6 MG 24 hr tablet   Oral   Take 12 mg by mouth daily.         . ranitidine (ZANTAC) 150 MG tablet   Oral   Take 150 mg by mouth 2 (two) times daily.          Triage  Vitals: BP 150/74  Pulse 86  Temp(Src) 98.3 F (36.8 C) (Oral)  Resp 18  Wt 138 lb (62.596 kg)  SpO2 100%  Physical Exam  Nursing note and vitals reviewed. Constitutional: She is oriented to person, place, and time. She appears well-developed and well-nourished.  HENT:  Head: Normocephalic and atraumatic.  Right Ear: Tympanic membrane, external ear and ear canal normal.  Left Ear: Tympanic membrane, external ear and ear canal normal.  Nose: Nose normal.  Mouth/Throat: Uvula is midline, oropharynx is clear and moist and mucous membranes are normal.  Mild reports tenderness reported over left temporal region. No temporal bruit.  Eyes: Conjunctivae, EOM and lids are normal. Pupils are equal, round, and reactive to light. Right eye exhibits no nystagmus. Left eye  exhibits no nystagmus.  Neck: Normal range of motion. Neck supple.  Cardiovascular: Normal rate and regular rhythm.   Pulmonary/Chest: Effort normal and breath sounds normal.  Abdominal: Soft. There is no tenderness.  Musculoskeletal: Normal range of motion.       Cervical back: She exhibits normal range of motion, no tenderness and no bony tenderness.  Neurological: She is alert and oriented to person, place, and time. She has normal strength and normal reflexes. No cranial nerve deficit or sensory deficit. She displays a negative Romberg sign. Coordination and gait normal. GCS eye subscore is 4. GCS verbal subscore is 5. GCS motor subscore is 6.  Skin: Skin is warm and dry.  Psychiatric: She has a normal mood and affect. Her behavior is normal.   ED Course  Procedures (including critical care time) DIAGNOSTIC STUDIES: Oxygen Saturation is 100% on RA, normal by my interpretation.   COORDINATION OF CARE: 6:52 PM- Will prescribe Ibuprofen and order lab work. Pt verbalizes understanding and agrees to plan.  Medications  ibuprofen (ADVIL,MOTRIN) tablet 600 mg (not administered)   Labs Review Labs Reviewed  SEDIMENTATION RATE   Imaging Review No results found.   EKG Interpretation None      Patient seen and examined. Work-up initiated. Medications ordered.   Vital signs reviewed and are as follows: Filed Vitals:   07/26/13 1655  BP: 150/74  Pulse: 86  Temp: 98.3 F (36.8 C)  Resp: 18   Patient left prior to having blood drawn without informing staff.    MDM   Final diagnoses:  Headache   Patient with left temporal HA. No neuro deficits. Patient with onset of pain while smoking crack -- although character of pain is not suggestive of thunderclap HA. She has a soreness in L temporal area. No sharp severe pain. No neck pain or signs of meningeal irritation. I do not suspect SAH. I would check ESR for temporal arteritis -- however patient left without informing anyone.  She appears competent to make medical decisions.   I personally performed the services described in this documentation, which was scribed in my presence. The recorded information has been reviewed and is accurate.    Carlisle Cater, PA-C 07/26/13 1927

## 2013-07-26 NOTE — ED Notes (Signed)
Unable to obtain blood for labs due to patient not being in room. RN made aware

## 2013-07-26 NOTE — ED Notes (Signed)
Pt not in room. PA advised 

## 2013-07-26 NOTE — ED Notes (Signed)
Pt not in room.

## 2013-08-29 ENCOUNTER — Encounter (HOSPITAL_COMMUNITY): Payer: Self-pay | Admitting: Emergency Medicine

## 2013-08-29 ENCOUNTER — Emergency Department (HOSPITAL_COMMUNITY): Payer: Medicaid Other

## 2013-08-29 ENCOUNTER — Emergency Department (HOSPITAL_COMMUNITY)
Admission: EM | Admit: 2013-08-29 | Discharge: 2013-08-29 | Disposition: A | Discharge status: Home / Self Care | Payer: Medicaid Other | Attending: Emergency Medicine | Admitting: Emergency Medicine

## 2013-08-29 DIAGNOSIS — F319 Bipolar disorder, unspecified: Secondary | ICD-10-CM

## 2013-08-29 DIAGNOSIS — F209 Schizophrenia, unspecified: Secondary | ICD-10-CM

## 2013-08-29 DIAGNOSIS — Z880 Allergy status to penicillin: Secondary | ICD-10-CM | POA: Insufficient documentation

## 2013-08-29 DIAGNOSIS — IMO0001 Reserved for inherently not codable concepts without codable children: Secondary | ICD-10-CM | POA: Insufficient documentation

## 2013-08-29 DIAGNOSIS — F17200 Nicotine dependence, unspecified, uncomplicated: Secondary | ICD-10-CM | POA: Insufficient documentation

## 2013-08-29 DIAGNOSIS — R4789 Other speech disturbances: Secondary | ICD-10-CM | POA: Insufficient documentation

## 2013-08-29 DIAGNOSIS — J45909 Unspecified asthma, uncomplicated: Secondary | ICD-10-CM | POA: Insufficient documentation

## 2013-08-29 DIAGNOSIS — IMO0002 Reserved for concepts with insufficient information to code with codable children: Secondary | ICD-10-CM | POA: Insufficient documentation

## 2013-08-29 DIAGNOSIS — Z79899 Other long term (current) drug therapy: Secondary | ICD-10-CM | POA: Insufficient documentation

## 2013-08-29 LAB — RAPID URINE DRUG SCREEN, HOSP PERFORMED
Amphetamines: NOT DETECTED
Barbiturates: NOT DETECTED
Benzodiazepines: NOT DETECTED
Cocaine: POSITIVE — AB
Opiates: NOT DETECTED
Tetrahydrocannabinol: NOT DETECTED

## 2013-08-29 LAB — CBC
HCT: 40.8 % (ref 36.0–46.0)
Hemoglobin: 13.4 g/dL (ref 12.0–15.0)
MCH: 24.2 pg — ABNORMAL LOW (ref 26.0–34.0)
MCHC: 32.8 g/dL (ref 30.0–36.0)
MCV: 73.6 fL — ABNORMAL LOW (ref 78.0–100.0)
Platelets: 239 10*3/uL (ref 150–400)
RBC: 5.54 MIL/uL — ABNORMAL HIGH (ref 3.87–5.11)
RDW: 14.4 % (ref 11.5–15.5)
WBC: 5.4 10*3/uL (ref 4.0–10.5)

## 2013-08-29 LAB — COMPREHENSIVE METABOLIC PANEL
ALT: 19 U/L (ref 0–35)
AST: 19 U/L (ref 0–37)
Albumin: 4 g/dL (ref 3.5–5.2)
Alkaline Phosphatase: 70 U/L (ref 39–117)
BUN: 4 mg/dL — ABNORMAL LOW (ref 6–23)
CO2: 26 mEq/L (ref 19–32)
Calcium: 9.8 mg/dL (ref 8.4–10.5)
Chloride: 100 mEq/L (ref 96–112)
Creatinine, Ser: 0.75 mg/dL (ref 0.50–1.10)
GFR calc Af Amer: >90 mL/min (ref >90)
GFR calc non Af Amer: >90 mL/min (ref >90)
Glucose, Bld: 75 mg/dL (ref 70–99)
Potassium: 3.8 mEq/L (ref 3.7–5.3)
Sodium: 136 mEq/L — ABNORMAL LOW (ref 137–147)
Total Bilirubin: 0.6 mg/dL (ref 0.3–1.2)
Total Protein: 8 g/dL (ref 6.0–8.3)

## 2013-08-29 LAB — SALICYLATE LEVEL: Salicylate Lvl: <2 mg/dL — ABNORMAL LOW (ref 2.8–20.0)

## 2013-08-29 LAB — URINALYSIS, ROUTINE W REFLEX MICROSCOPIC
Bilirubin Urine: NEGATIVE
Glucose, UA: NEGATIVE mg/dL
Hgb urine dipstick: NEGATIVE
Ketones, ur: NEGATIVE mg/dL
Nitrite: NEGATIVE
Protein, ur: NEGATIVE mg/dL
Specific Gravity, Urine: 1.007 (ref 1.005–1.030)
Urobilinogen, UA: 1 mg/dL (ref 0.0–1.0)
pH: 7.5 (ref 5.0–8.0)

## 2013-08-29 LAB — URINE MICROSCOPIC-ADD ON

## 2013-08-29 LAB — ETHANOL: Alcohol, Ethyl (B): <11 mg/dL (ref 0–11)

## 2013-08-29 LAB — LITHIUM LEVEL: Lithium Lvl: 1.26 mEq/L (ref 0.80–1.40)

## 2013-08-29 LAB — ACETAMINOPHEN LEVEL: Acetaminophen (Tylenol), Serum: <15 ug/mL (ref 10–30)

## 2013-08-29 MED ORDER — LORAZEPAM 1 MG PO TABS: 1.0000 mg | ORAL_TABLET | Freq: Three times a day (TID) | ORAL | Status: DC | PRN

## 2013-08-29 MED ORDER — ACETAMINOPHEN 325 MG PO TABS: 650.0000 mg | ORAL_TABLET | ORAL | Status: DC | PRN

## 2013-08-29 MED ORDER — ONDANSETRON HCL 4 MG PO TABS: 4.0000 mg | ORAL_TABLET | Freq: Three times a day (TID) | ORAL | Status: DC | PRN

## 2013-08-29 MED ORDER — ALUM & MAG HYDROXIDE-SIMETH 200-200-20 MG/5ML PO SUSP: 30.0000 mL | ORAL | Status: DC | PRN

## 2013-08-29 MED ORDER — NICOTINE 21 MG/24HR TD PT24: 21.0000 mg | MEDICATED_PATCH | Freq: Every day | TRANSDERMAL | Status: DC

## 2013-08-29 MED ORDER — IBUPROFEN 200 MG PO TABS: 600.0000 mg | ORAL_TABLET | Freq: Three times a day (TID) | ORAL | Status: DC | PRN

## 2013-08-29 MED ORDER — ZOLPIDEM TARTRATE 5 MG PO TABS: 5.0000 mg | ORAL_TABLET | Freq: Every evening | ORAL | Status: DC | PRN

## 2013-08-29 NOTE — ED Provider Notes (Signed)
Karen Best [facility] 8:30 PM was contacted by Estill Bamberg with [facility] patient has followup planned at [facility]. There is no indication for psychiatric hospitalization at this time. Patient may be discharged followup tomorrow and agrees with plan.  Martie Lee, PA-C 08/29/13 2059

## 2013-08-29 NOTE — BH Assessment (Signed)
Assessment Note  Karen Best is an 58 y.o. female who was referred to [facility] by her psychiatrist, Beverly Sessions, after she presented to her regular appointment seeming somewhat sedated.  Karen Best reports she sees [facility] for Schizophrenia and reports that as of yesterday has had troubl ewalking and talking.  She had a complete work up in the emergency department, which was unremarkable.  She reports she has also experienced recent visual hallucinations, which are somewhat unnerving, but not frightening or disorienting.  She denies any thoughts of harming herself or others, now or in teh last six months, but does endorse the use of crack cocaine a few times a week, most recently a few days ago.  She is difficult to understand and has soft slurred speech.  She is somewhat disheveled, but calm, cooperative, and oriented x 4.  She admits to some depressive symptoms including feelings of guilt and isolating behavior, which she attributes to a falling out with her daughter.  The patient's ED provider, Clovis Cao, believes she just needs her medication adjusted and Patriciaann Clan, [facility] PA agrees that the patient does not meet inpatient criteria and has no lethality to herself or others.  This Probation officer followed up with Lesly Rubenstein at [facility], who reports once the patient has been medically cleared, she should be instructed to follow up with them tomorrow for med evaluation.  Axis I: Chronic Paranoid Schizophrenia Axis II: Deferred Axis III:  Past Medical History  Diagnosis Date  . Fibromyalgia   . Schizophrenia   . Asthma   . Bipolar 1 disorder   . Tobacco abuse    Axis IV: problems with access to health care services and problems with primary support group Axis V: 41-50 serious symptoms  Past Medical History:  Past Medical History  Diagnosis Date  . Fibromyalgia   . Schizophrenia   . Asthma   . Bipolar 1 disorder   . Tobacco abuse     Past Surgical History  Procedure Laterality Date  .  Cesarean section    . Tubal ligation      Family History: History reviewed. No pertinent family history.  Social History:  reports that she has been smoking.  She does not have any smokeless tobacco history on file. She reports that she does not drink alcohol or use illicit drugs.  Additional Social History:  Alcohol / Drug Use History of alcohol / drug use?: Yes Longest period of sobriety (when/how long): na  CIWA: CIWA-Ar BP: 109/72 mmHg Pulse Rate: 79 COWS:    Allergies:  Allergies  Allergen Reactions  . Penicillins     unknown    Home Medications:  (Not in a hospital admission)  OB/GYN Status:  No LMP recorded. Patient is postmenopausal.  General Assessment Data Location of Assessment: [facility] Is this a Tele or Face-to-Face Assessment?: Face-to-Face Is this an Initial Assessment or a Re-assessment for this encounter?: Initial Assessment Living Arrangements: Alone Can pt return to current living arrangement?: Yes Admission Status: Voluntary Is patient capable of signing voluntary admission?: Yes Transfer from:  Beverly Sessions) Referral Source:  Beverly Sessions)     [facility] Crisis Care Plan Living Arrangements: Alone Name of Psychiatrist: [facility]  Education Status Is patient currently in school?: No  Risk to self Suicidal Ideation: No-Not Currently/Within Last 6 Months Suicidal Intent: No Is patient at risk for suicide?: No Suicidal Plan?: No Access to Means: No What has been your use of drugs/alcohol within the last 12 months?: crack cocaine Previous Attempts/Gestures: No How many  times?: 0 Intentional Self Injurious Behavior: None Family Suicide History: No Recent stressful life event(s): Turmoil (Comment);Conflict (Comment) (argument wtih daughter) Persecutory voices/beliefs?: No Depression: Yes Depression Symptoms: Isolating;Feeling angry/irritable;Feeling worthless/self pity;Guilt Substance abuse history and/or treatment for substance abuse?: No Suicide prevention  information given to non-admitted patients: Not applicable  Risk to Others Homicidal Ideation: No Thoughts of Harm to Others: No Current Homicidal Intent: No Current Homicidal Plan: No Access to Homicidal Means: No History of harm to others?: No Assessment of Violence: In distant past Does patient have access to weapons?: No Criminal Charges Pending?: No Does patient have a court date: No  Psychosis Hallucinations: Visual (can't specify, recent onset, not frightening) Delusions: None noted  Mental Status Report Appear/Hygiene: Disheveled Eye Contact: Good Motor Activity: Freedom of movement;Rigidity Speech: Slurred Level of Consciousness: Alert Mood: Depressed Affect: Appropriate to circumstance Anxiety Level: None Thought Processes: Coherent;Relevant Judgement: Unimpaired Orientation: Person;Place;Time;Situation Obsessive Compulsive Thoughts/Behaviors: None  Cognitive Functioning Concentration: Normal Memory: Recent Impaired;Remote Intact IQ: Average Insight: Good Impulse Control: Good Appetite: Good Weight Loss: 0 Weight Gain: 0 Sleep: No Change Vegetative Symptoms: None  ADLScreening [facility] Assessment Services) Patient's cognitive ability adequate to safely complete daily activities?: Yes Patient able to express need for assistance with ADLs?: Yes Independently performs ADLs?: Yes (appropriate for developmental age)  Prior Inpatient Therapy Prior Inpatient Therapy: Yes Prior Therapy Dates: unk Prior Therapy Facilty/Provider(s): [facility] Reason for Treatment: schizophrenia  Prior Outpatient Therapy Prior Outpatient Therapy: Yes Prior Therapy Dates: onoging Prior Therapy Facilty/Provider(s): [facility] Reason for Treatment: schizophrenia  ADL Screening (condition at time of admission) Patient's cognitive ability adequate to safely complete daily activities?: Yes Patient able to express need for assistance with ADLs?: Yes Independently performs ADLs?: Yes  (appropriate for developmental age)       Abuse/Neglect Assessment (Assessment to be complete while patient is alone) Physical Abuse: Denies Verbal Abuse: Denies Sexual Abuse: Denies Values / Beliefs Cultural Requests During Hospitalization: None Spiritual Requests During Hospitalization: None   Advance Directives (For Healthcare) Advance Directive: Patient does not have advance directive;Patient would not like information Nutrition Screen- [facility] Adult/[facility] Patient's home diet: Regular  Additional Information 1:1 In Past 12 Months?: No CIRT Risk: No Elopement Risk: No Does patient have medical clearance?: Yes     Disposition:  Disposition Initial Assessment Completed for this Encounter: Yes Disposition of Patient: Referred to;Other dispositions Other disposition(s): To current provider  On Site Evaluation by:   Reviewed with Physician:    Jesus Genera Lomax 08/29/2013 9:39 PM

## 2013-08-29 NOTE — Discharge Instructions (Signed)
Please followup with [facility] tomorrow as planned.    Bipolar Disorder Bipolar disorder is a mental illness. The term bipolar disorder actually is used to describe a group of disorders that all share varying degrees of emotional highs and lows that can interfere with daily functioning, such as work, school, or relationships. Bipolar disorder also can lead to drug abuse, hospitalization, and suicide. The emotional highs of bipolar disorder are periods of elation or irritability and high energy. These highs can range from a mild form (hypomania) to a severe form (mania). People experiencing episodes of hypomania may appear energetic, excitable, and highly productive. People experiencing mania may behave impulsively or erratically. They often make poor decisions. They may have difficulty sleeping. The most severe episodes of mania can involve having very distorted beliefs or perceptions about the world and seeing or hearing things that are not real (psychotic delusions and hallucinations).  The emotional lows of bipolar disorder (depression) also can range from mild to severe. Severe episodes of bipolar depression can involve psychotic delusions and hallucinations. Sometimes people with bipolar disorder experience a state of mixed mood. Symptoms of hypomania or mania and depression are both present during this mixed-mood episode. SIGNS AND SYMPTOMS There are signs and symptoms of the episodes of hypomania and mania as well as the episodes of depression. The signs and symptoms of hypomania and mania are similar but vary in severity. They include:  Inflated self-esteem or feeling of increased self-confidence.  Decreased need for sleep.  Unusual talkativeness (rapid or pressured speech) or the feeling of a need to keep talking.  Sensation of racing thoughts or constant talking, with quick shifts between topics that may or may not be related (flight of ideas).  Decreased ability to focus or  concentrate.  Increased purposeful activity, such as work, studies, or social activity, or nonproductive activity, such as pacing, squirming and fidgeting, or finger and toe tapping.  Impulsive behavior and use of poor judgment, resulting in high-risk activities, such as having unprotected sex or spending excessive amounts of money. Signs and symptoms of depression include the following:   Feelings of sadness, hopelessness, or helplessness.  Frequent or uncontrollable episodes of crying.  Lack of feeling anything or caring about anything.  Difficulty sleeping or sleeping too much.  Inability to enjoy the things you used to enjoy.   Desire to be alone all the time.   Feelings of guilt or worthlessness.  Lack of energy or motivation.   Difficulty concentrating, remembering, or making decisions.  Change in appetite or weight beyond normal fluctuations.  Thoughts of death or the desire to harm yourself. DIAGNOSIS  Bipolar disorder is diagnosed through an assessment by your caregiver. Your caregiver will ask questions about your emotional episodes. There are two main types of bipolar disorder. People with type I bipolar disorder have manic episodes with or without depressive episodes. People with type II bipolar disorder have hypomanic episodes and major depressive episodes, which are more serious than mild depression. The type of bipolar disorder you have can make an important difference in how your illness is monitored and treated. Your caregiver may ask questions about your medical history and use of alcohol or drugs, including prescription medication. Certain medical conditions and substances also can cause emotional highs and lows that resemble bipolar disorder (secondary bipolar disorder).  TREATMENT  Bipolar disorder is a long-term illness. It is best controlled with continuous treatment rather than treatment only when symptoms occur. The following treatments can be prescribed  for bipolar  disorders:  Medication Medication can be prescribed by a doctor that is an expert in treating mental disorders (psychiatrists). Medications called mood stabilizers are usually prescribed to help control the illness. Other medications are sometimes added if symptoms of mania, depression, or psychotic delusions and hallucinations occur despite the use of a mood stabilizer.  Talk therapy Some forms of talk therapy are helpful in providing support, education, and guidance. A combination of medication and talk therapy is best for managing the disorder over time. A procedure in which electricity is applied to your brain through your scalp (electroconvulsive therapy) is used in cases of severe mania when medication and talk therapy do not work or work too slowly. Document Released: 07/21/2000 Document Revised: 08/09/2012 Document Reviewed: 05/10/2012 [facility] Patient Information 2014 [place], [place].

## 2013-08-29 NOTE — ED Notes (Signed)
Pt sent by [facility] for evaluation. Per note from [facility] pt seemed sedated when seen by them and was sent for eval and lithium level. Pt alert and oriented. Denies SI/HI.

## 2013-08-29 NOTE — ED Provider Notes (Signed)
CSN: [id number]     Arrival date & time 08/29/13  1322 History  This chart was scribed for non-physician practitioner, Cleatrice Burke, PA-C working with Thayer Jew, MD by Frederich Balding, ED scribe. This patient was seen in room WTR3/WLPT3 and the patient's care was started at 3:01 PM.   Chief Complaint  Patient presents with  . Medical Clearance   The history is provided by the patient. No language interpreter was used.   HPI Comments: Karen Best is a 58 y.o. female with history of schizophrenia, bipolar 1 disorder and fibromyalgia who presents to the Emergency Department for medical clearance. Pt was sent here by [facility] for evaluation and lithium level. Per [facility]'s note, pt seemed sedated when she was seen by them. Pt states she has not been taking her medications. She admits to hallucinations, but these do not bother her. She states that she is having difficulty speaking. She denies headache or dizziness. Denies SI/HI.  Past Medical History  Diagnosis Date  . Fibromyalgia   . Schizophrenia   . Asthma   . Bipolar 1 disorder   . Tobacco abuse    Past Surgical History  Procedure Laterality Date  . Cesarean section    . Tubal ligation     History reviewed. No pertinent family history. History  Substance Use Topics  . Smoking status: Current Every Day Smoker  . Smokeless tobacco: Not on file  . Alcohol Use: No     Comment: Patient denies    OB History   Grav Para Term Preterm Abortions TAB SAB Ect Mult Living                 Review of Systems  Neurological: Positive for speech difficulty.  Psychiatric/Behavioral: Negative for suicidal ideas.  All other systems reviewed and are negative.  Allergies  Penicillins  Home Medications   Prior to Admission medications   Medication Sig Start Date End Date Taking? Authorizing Provider  albuterol (PROVENTIL HFA;VENTOLIN HFA) 108 (90 BASE) MCG/ACT inhaler Inhale 1-2 puffs into the lungs every 6 (six) hours as needed  for wheezing. 11/18/12  Yes Clayton Bibles, PA-C  amantadine (SYMMETREL) 100 MG capsule Take 1 capsule (100 mg total) by mouth 2 (two) times daily. 04/01/13  Yes Elmarie Shiley, NP  beclomethasone (QVAR) 40 MCG/ACT inhaler Inhale 2 puffs into the lungs 2 (two) times daily.   Yes Historical Provider, MD  paliperidone (INVEGA) 6 MG 24 hr tablet Take 12 mg by mouth daily. 04/01/13  Yes Elmarie Shiley, NP  ranitidine (ZANTAC) 150 MG tablet Take 150 mg by mouth 2 (two) times daily.    Historical Provider, MD   BP 109/72  Pulse 79  Temp(Src) 97.5 F (36.4 C) (Oral)  Resp 18  SpO2 99%  Physical Exam  Nursing note and vitals reviewed. Constitutional: She is oriented to person, place, and time. She appears well-developed and well-nourished. No distress.  HENT:  Head: Normocephalic and atraumatic.  Right Ear: External ear normal.  Left Ear: External ear normal.  Nose: Nose normal.  Mouth/Throat: Oropharynx is clear and moist.  Eyes: Conjunctivae are normal. Pupils are equal, round, and reactive to light.  Neck: Normal range of motion.  Cardiovascular: Normal rate, regular rhythm and normal heart sounds.   Pulmonary/Chest: Effort normal and breath sounds normal. No stridor. No respiratory distress. She has no wheezes. She has no rales.  Abdominal: Soft. She exhibits no distension.  Musculoskeletal: Normal range of motion.  Neurological: She is alert and oriented to  person, place, and time. She has normal strength.  Normal gait. No ataxia. Grip strength 5/5 bilaterally.   Skin: Skin is warm and dry. She is not diaphoretic. No erythema.  Psychiatric: She has a normal mood and affect. Her speech is slurred. She is actively hallucinating. She expresses no homicidal and no suicidal ideation.    ED Course  Procedures (including critical care time)  DIAGNOSTIC STUDIES: Oxygen Saturation is 99% on RA, normal by my interpretation.    COORDINATION OF CARE: 3:03 PM-Discussed treatment plan which includes lab  work and head CT with pt at bedside and pt agreed to plan.   Labs Review Labs Reviewed  CBC - Abnormal; Notable for the following:    RBC 5.54 (*)    MCV 73.6 (*)    MCH 24.2 (*)    All other components within normal limits  COMPREHENSIVE METABOLIC PANEL - Abnormal; Notable for the following:    Sodium 136 (*)    BUN 4 (*)    All other components within normal limits  SALICYLATE LEVEL - Abnormal; Notable for the following:    Salicylate Lvl <1.7 (*)    All other components within normal limits  URINE RAPID DRUG SCREEN (HOSP PERFORMED) - Abnormal; Notable for the following:    Cocaine POSITIVE (*)    All other components within normal limits  URINALYSIS, ROUTINE W REFLEX MICROSCOPIC - Abnormal; Notable for the following:    Leukocytes, UA TRACE (*)    All other components within normal limits  URINE CULTURE  ACETAMINOPHEN LEVEL  ETHANOL  LITHIUM LEVEL  URINE MICROSCOPIC-ADD ON    Imaging Review Dg Chest 2 View  08/29/2013   CLINICAL DATA:  SOB  EXAM: CHEST  2 VIEW  COMPARISON:  DG CHEST 2 VIEW dated 11/18/2012  FINDINGS: The heart size and mediastinal contours are within normal limits. Both lungs are clear. The visualized skeletal structures are unremarkable.  IMPRESSION: No active cardiopulmonary disease.   Electronically Signed   By: Margaree Mackintosh M.D.   On: 08/29/2013 17:20   Ct Head Wo Contrast  08/29/2013   CLINICAL DATA:  Headache  EXAM: CT HEAD WITHOUT CONTRAST  TECHNIQUE: Contiguous axial images were obtained from the base of the skull through the vertex without intravenous contrast.  COMPARISON:  None.  FINDINGS: No acute cortical infarct, hemorrhage, or mass lesion ispresent. Ventricles are of normal size. No significant extra-axial fluid collection is present. The paranasal sinuses andmastoid air cells are clear. The osseous skull is intact.  IMPRESSION: 1. No acute intracranial abnormalities.   Electronically Signed   By: Kerby Moors M.D.   On: 08/29/2013 16:02      EKG Interpretation None      MDM   Final diagnoses:  Bipolar 1 disorder  Schizophrenia    Patient presents to ED for medical clearance. [facility] wanted a lithium level on this patient. Lithium 1.26. Altered mental status workup unremarkable. Unsure of patient's baseline, but she appears to be functional and competent to make decisions. Patient was evaluated by TTS who believe patient is stable from psych standpoint. Patient will be discharged home. Dr. Dina Rich evaluated this patient and agrees with plan. Return instructions given to patient. Vital signs stable for discharge. Patient / Family / Caregiver informed of clinical course, understand medical decision-making process, and agree with plan.   I personally performed the services described in this documentation, which was scribed in my presence. The recorded information has been reviewed and is accurate.  Kara Mead  Marily Memos, PA-C 09/06/13 [place], PA-C 09/06/13 1539

## 2013-08-30 LAB — URINE CULTURE
Colony Count: NO GROWTH
Culture: NO GROWTH

## 2013-08-31 NOTE — ED Provider Notes (Signed)
Medical screening examination/treatment/procedure(s) were performed by non-physician practitioner and as supervising physician I was immediately available for consultation/collaboration.   EKG Interpretation None       Courtney F Horton, MD 08/31/13 0113 

## 2013-09-07 NOTE — ED Provider Notes (Signed)
Medical screening examination/treatment/procedure(s) were performed by non-physician practitioner and as supervising physician I was immediately available for consultation/collaboration.   EKG Interpretation None        Merryl Hacker, MD 09/07/13 [phone]

## 2013-09-16 ENCOUNTER — Emergency Department (HOSPITAL_COMMUNITY)
Admission: EM | Admit: 2013-09-16 | Discharge: 2013-09-19 | Disposition: A | Discharge status: Other Acute Inpatient Hospital | Payer: Medicaid Other | Attending: Emergency Medicine | Admitting: Emergency Medicine

## 2013-09-16 ENCOUNTER — Encounter (HOSPITAL_COMMUNITY): Payer: Self-pay | Admitting: Emergency Medicine

## 2013-09-16 DIAGNOSIS — Z79899 Other long term (current) drug therapy: Secondary | ICD-10-CM | POA: Insufficient documentation

## 2013-09-16 DIAGNOSIS — J45909 Unspecified asthma, uncomplicated: Secondary | ICD-10-CM | POA: Insufficient documentation

## 2013-09-16 DIAGNOSIS — F319 Bipolar disorder, unspecified: Secondary | ICD-10-CM | POA: Insufficient documentation

## 2013-09-16 DIAGNOSIS — Z8739 Personal history of other diseases of the musculoskeletal system and connective tissue: Secondary | ICD-10-CM | POA: Insufficient documentation

## 2013-09-16 DIAGNOSIS — F209 Schizophrenia, unspecified: Secondary | ICD-10-CM | POA: Insufficient documentation

## 2013-09-16 DIAGNOSIS — F17200 Nicotine dependence, unspecified, uncomplicated: Secondary | ICD-10-CM | POA: Insufficient documentation

## 2013-09-16 DIAGNOSIS — F29 Unspecified psychosis not due to a substance or known physiological condition: Secondary | ICD-10-CM | POA: Diagnosis present

## 2013-09-16 DIAGNOSIS — Z880 Allergy status to penicillin: Secondary | ICD-10-CM | POA: Insufficient documentation

## 2013-09-16 LAB — COMPREHENSIVE METABOLIC PANEL
ALT: 22 U/L (ref 0–35)
AST: 20 U/L (ref 0–37)
Albumin: 4.4 g/dL (ref 3.5–5.2)
Alkaline Phosphatase: 66 U/L (ref 39–117)
BUN: 6 mg/dL (ref 6–23)
CO2: 23 mEq/L (ref 19–32)
Calcium: 10.7 mg/dL — ABNORMAL HIGH (ref 8.4–10.5)
Chloride: 99 mEq/L (ref 96–112)
Creatinine, Ser: 0.75 mg/dL (ref 0.50–1.10)
GFR calc Af Amer: >90 mL/min (ref >90)
GFR calc non Af Amer: >90 mL/min (ref >90)
Glucose, Bld: 95 mg/dL (ref 70–99)
Potassium: 4 mEq/L (ref 3.7–5.3)
Sodium: 135 mEq/L — ABNORMAL LOW (ref 137–147)
Total Bilirubin: 0.5 mg/dL (ref 0.3–1.2)
Total Protein: 8.7 g/dL — ABNORMAL HIGH (ref 6.0–8.3)

## 2013-09-16 LAB — CBC
HCT: 42.4 % (ref 36.0–46.0)
Hemoglobin: 13.2 g/dL (ref 12.0–15.0)
MCH: 23.1 pg — ABNORMAL LOW (ref 26.0–34.0)
MCHC: 31.1 g/dL (ref 30.0–36.0)
MCV: 74.1 fL — ABNORMAL LOW (ref 78.0–100.0)
Platelets: 211 10*3/uL (ref 150–400)
RBC: 5.72 MIL/uL — ABNORMAL HIGH (ref 3.87–5.11)
RDW: 14.9 % (ref 11.5–15.5)
WBC: 4.1 10*3/uL (ref 4.0–10.5)

## 2013-09-16 LAB — RAPID URINE DRUG SCREEN, HOSP PERFORMED
Amphetamines: NOT DETECTED
Barbiturates: NOT DETECTED
Benzodiazepines: NOT DETECTED
Cocaine: NOT DETECTED
Opiates: NOT DETECTED
Tetrahydrocannabinol: NOT DETECTED

## 2013-09-16 LAB — SALICYLATE LEVEL: Salicylate Lvl: <2 mg/dL — ABNORMAL LOW (ref 2.8–20.0)

## 2013-09-16 LAB — ETHANOL: Alcohol, Ethyl (B): <11 mg/dL (ref 0–11)

## 2013-09-16 LAB — ACETAMINOPHEN LEVEL: Acetaminophen (Tylenol), Serum: <15 ug/mL (ref 10–30)

## 2013-09-16 MED ORDER — MAGNESIUM HYDROXIDE 400 MG/5ML PO SUSP
30.0000 mL | Freq: Once | ORAL | Status: AC
  Administered 2013-09-16: 30 mL via ORAL
  Filled 2013-09-16 (×4): qty 30

## 2013-09-16 MED ORDER — PALIPERIDONE ER 6 MG PO TB24
12.0000 mg | ORAL_TABLET | Freq: Every day | ORAL | Status: DC
  Administered 2013-09-16 – 2013-09-19 (×4): 12 mg via ORAL
  Filled 2013-09-16 (×4): qty 2

## 2013-09-16 MED ORDER — AMANTADINE HCL 100 MG PO CAPS
100.0000 mg | ORAL_CAPSULE | Freq: Two times a day (BID) | ORAL | Status: DC
  Administered 2013-09-16 – 2013-09-19 (×7): 100 mg via ORAL
  Filled 2013-09-16 (×9): qty 1

## 2013-09-16 MED ORDER — ACETAMINOPHEN 325 MG PO TABS: 650.0000 mg | ORAL_TABLET | Freq: Four times a day (QID) | ORAL | Status: DC | PRN

## 2013-09-16 MED ORDER — FAMOTIDINE 20 MG PO TABS
20.0000 mg | ORAL_TABLET | Freq: Two times a day (BID) | ORAL | Status: DC
  Administered 2013-09-16 – 2013-09-19 (×7): 20 mg via ORAL
  Filled 2013-09-16 (×7): qty 1

## 2013-09-16 NOTE — ED Notes (Signed)
Pt forgot to void in specimen cup

## 2013-09-16 NOTE — Consult Note (Signed)
Patient seen and evaluated by me. The treatment plan is also formulated by me. Patient has had increasing paranoia, hallucinations over the last few weeks and needs inpatient psychiatric care.

## 2013-09-16 NOTE — ED Provider Notes (Signed)
CSN: [id number]     Arrival date & time 09/16/13  0207 History   First MD Initiated Contact with Patient 09/16/13 0248     Chief Complaint  Patient presents with  . Medical Clearance   HPI  History provided by the patient. Patient is a 58 year old female with history of schizophrenia and bipolar disorder who is presenting with concerns for hallucinations. Patient is a poor historian. She states that she has been hearing and seeing things in her apartment. States she lives alone. She was afraid and decided to come to the emergency room. She denies SI or HI. Denies any other complaints.    Past Medical History  Diagnosis Date  . Fibromyalgia   . Schizophrenia   . Asthma   . Bipolar 1 disorder   . Tobacco abuse    Past Surgical History  Procedure Laterality Date  . Cesarean section    . Tubal ligation     History reviewed. No pertinent family history. History  Substance Use Topics  . Smoking status: Current Every Day Smoker  . Smokeless tobacco: Not on file  . Alcohol Use: No     Comment: Patient denies    OB History   Grav Para Term Preterm Abortions TAB SAB Ect Mult Living                 Review of Systems  All other systems reviewed and are negative.     Allergies  Penicillins  Home Medications   Prior to Admission medications   Medication Sig Start Date End Date Taking? Authorizing Provider  albuterol (PROVENTIL HFA;VENTOLIN HFA) 108 (90 BASE) MCG/ACT inhaler Inhale 1-2 puffs into the lungs every 6 (six) hours as needed for wheezing. 11/18/12  Yes Clayton Bibles, PA-C  amantadine (SYMMETREL) 100 MG capsule Take 1 capsule (100 mg total) by mouth 2 (two) times daily. 04/01/13  Yes Elmarie Shiley, NP  beclomethasone (QVAR) 40 MCG/ACT inhaler Inhale 2 puffs into the lungs 2 (two) times daily.   Yes Historical Provider, MD  paliperidone (INVEGA) 6 MG 24 hr tablet Take 12 mg by mouth daily. 04/01/13  Yes Elmarie Shiley, NP  ranitidine (ZANTAC) 150 MG tablet Take 150 mg by mouth 2  (two) times daily.   Yes Historical Provider, MD   BP 139/82  Pulse 79  Temp(Src) 97.4 F (36.3 C) (Oral)  Resp 18  SpO2 100% Physical Exam  Nursing note and vitals reviewed. Constitutional: She is oriented to person, place, and time. She appears well-developed and well-nourished. No distress.  HENT:  Head: Normocephalic and atraumatic.  Edentulous  Eyes: Conjunctivae and EOM are normal. Pupils are equal, round, and reactive to light.  Cardiovascular: Normal rate and regular rhythm.   Pulmonary/Chest: Effort normal and breath sounds normal.  Abdominal: Soft.  Musculoskeletal: Normal range of motion.  Neurological: She is alert and oriented to person, place, and time.  Skin: Skin is warm and dry. No rash noted.  Psychiatric: Her behavior is normal.    ED Course  Procedures   COORDINATION OF CARE:  Nursing notes reviewed. Vital signs reviewed. Initial pt interview and examination performed.   Filed Vitals:   09/16/13 0210  BP: 139/82  Pulse: 79  Temp: 97.4 F (36.3 C)  TempSrc: Oral  Resp: 18  SpO2: 100%    3:45AM patient seen and evaluated. Patient is calm cooperative does not appear in any acute distress.  Patient is medically clear for further psychiatric evaluation. Currently she does not appear to be  a harm to herself or others.  Results for orders placed during the hospital encounter of 09/16/13  ACETAMINOPHEN LEVEL      Result Value Ref Range   Acetaminophen (Tylenol), Serum <15.0  10 - 30 ug/mL  CBC      Result Value Ref Range   WBC 4.1  4.0 - 10.5 K/uL   RBC 5.72 (*) 3.87 - 5.11 MIL/uL   Hemoglobin 13.2  12.0 - 15.0 g/dL   HCT 42.4  36.0 - 46.0 %   MCV 74.1 (*) 78.0 - 100.0 fL   MCH 23.1 (*) 26.0 - 34.0 pg   MCHC 31.1  30.0 - 36.0 g/dL   RDW 14.9  11.5 - 15.5 %   Platelets 211  150 - 400 K/uL  COMPREHENSIVE METABOLIC PANEL      Result Value Ref Range   Sodium 135 (*) 137 - 147 mEq/L   Potassium 4.0  3.7 - 5.3 mEq/L   Chloride 99  96 - 112 mEq/L    CO2 23  19 - 32 mEq/L   Glucose, Bld 95  70 - 99 mg/dL   BUN 6  6 - 23 mg/dL   Creatinine, Ser 0.75  0.50 - 1.10 mg/dL   Calcium 10.7 (*) 8.4 - 10.5 mg/dL   Total Protein 8.7 (*) 6.0 - 8.3 g/dL   Albumin 4.4  3.5 - 5.2 g/dL   AST 20  0 - 37 U/L   ALT 22  0 - 35 U/L   Alkaline Phosphatase 66  39 - 117 U/L   Total Bilirubin 0.5  0.3 - 1.2 mg/dL   GFR calc non Af Amer >90  >90 mL/min   GFR calc Af Amer >90  >90 mL/min  ETHANOL      Result Value Ref Range   Alcohol, Ethyl (B) <11  0 - 11 mg/dL  SALICYLATE LEVEL      Result Value Ref Range   Salicylate Lvl <8.3 (*) 2.8 - 20.0 mg/dL  URINE RAPID DRUG SCREEN (HOSP PERFORMED)      Result Value Ref Range   Opiates NONE DETECTED  NONE DETECTED   Cocaine NONE DETECTED  NONE DETECTED   Benzodiazepines NONE DETECTED  NONE DETECTED   Amphetamines NONE DETECTED  NONE DETECTED   Tetrahydrocannabinol NONE DETECTED  NONE DETECTED   Barbiturates NONE DETECTED  NONE DETECTED        MDM   Final diagnoses:  Schizophrenia        Martie Lee, PA-C 09/16/13 [phone]

## 2013-09-16 NOTE — Progress Notes (Signed)
Attempted to secure placement at the following facilities:  [facility]- [name]- faxed information [facility]- [name]- faxed information [facility]- Don- faxed information [facility]- [name]- faxed information [facility]- [name]- no beds [facility]- [name]- only female general  [name]- [name]- only female bed after 5pm [facility]- [name]- no beds [facility]- [name]- faxed information [facility]- [name]- no beds [facility]- [name]- only low acuity [facility]- [name]- faxed information [facility]- [name]- no beds [facility]- left message [facility]- [name]- faxed information [facility]- left message   Karen Best, MSW, LCSWA, 09/16/2013 Evening Clinical Social Worker [phone]  

## 2013-09-16 NOTE — ED Notes (Signed)
Pt transferred from triage, presents with complaint of increase in Auditory hallucinations.  Denies SI or HI.  Pt reports she had a recent medication change.  Pt calm & cooperative at present. No distress noted.

## 2013-09-16 NOTE — ED Notes (Signed)
Pt states that she has recently changed her medications and has been having an increase in her AVH. Pt denies SI/HI. States she is more anxious with the voices. Pt a&o x4, calm and cooperative at this time.

## 2013-09-16 NOTE — ED Notes (Signed)
Pt presents with c/o medical clearance. Per EMS, pt has a hx of bipolar disorder and a hx of schizophrenia. Pt has been hallucinating for a month and has been hearing voices, no suicidal or homicidal thoughts per EMS. No pain per EMS.
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
## 2013-09-16 NOTE — ED Notes (Signed)
Bed: WLPT4 Expected date:  Expected time:  Means of arrival:  Comments: Med Clearance

## 2013-09-17 ENCOUNTER — Encounter (HOSPITAL_COMMUNITY): Payer: Self-pay | Admitting: Psychiatry
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
## 2013-09-18 DIAGNOSIS — IMO0001 Reserved for inherently not codable concepts without codable children: Secondary | ICD-10-CM

## 2013-09-18 DIAGNOSIS — F17200 Nicotine dependence, unspecified, uncomplicated: Secondary | ICD-10-CM

## 2013-09-18 DIAGNOSIS — J45909 Unspecified asthma, uncomplicated: Secondary | ICD-10-CM

## 2013-09-18 DIAGNOSIS — F319 Bipolar disorder, unspecified: Secondary | ICD-10-CM

## 2013-09-18 NOTE — Progress Notes (Signed)
Karen Guse [id number]  SUBJECTIVE:  Patient was seen this am calm and cooperative.  She admitted to not taking her medications as she should due to forgetfulness.  Patient  Stated she started having visual hallucination after her brother died in 27-Aug-2022.  Patient reported that she started having more visual hallucinations as a result of not taking her medications.  She reported seeing a psychiatrist at [facility] and that she fills her prescription medications at [facility].  She denies SI/HI /AH.  We will continue to monitor patient while we seek placement some where else.  We are at capacity at this time.   Mental Status Examination Psychiatric Specialty Exam: Physical Exam  ROS  Blood pressure 133/83, pulse 67, temperature 98 F (36.7 C), temperature source Oral, resp. rate 16, SpO2 100.00%.There is no weight on file to calculate BMI.  General Appearance: Casual  Eye Contact::  Good  Speech:  Clear and Coherent and Normal Rate  Volume:  Normal  Mood:  Anxious  Affect:  Congruent  Thought Process:  Coherent and Linear  Orientation:  Full (Time, Place, and Person)  Thought Content:  Hallucinations: Visual  Suicidal Thoughts:  No  Homicidal Thoughts:  No  Memory:  Immediate;   Fair Recent;   Fair Remote;   Fair  Judgement:  Fair  Insight:  Fair  Psychomotor Activity:  Normal  Concentration:  Good  Recall:  NA  Akathisia:  NA  Handed:  Right  AIMS (if indicated):     Assets:  Desire for Improvement  Sleep:         Current MedicationCurrent facility-administered medications:acetaminophen (TYLENOL) tablet 650 mg, 650 mg, Oral, Q6H PRN, Lurena Nida, NP;  amantadine (SYMMETREL) capsule 100 mg, 100 mg, Oral, BID, Lurena Nida, NP, 100 mg at 09/18/13 1058;  famotidine (PEPCID) tablet 20 mg, 20 mg, Oral, BID, Lurena Nida, NP, 20 mg at 09/18/13 1058;  paliperidone (INVEGA) 24 hr tablet 12 mg, 12 mg, Oral, Daily, Lurena Nida, NP, 12 mg at 09/18/13 1058 Current outpatient  prescriptions:albuterol (PROVENTIL HFA;VENTOLIN HFA) 108 (90 BASE) MCG/ACT inhaler, Inhale 1-2 puffs into the lungs every 6 (six) hours as needed for wheezing., Disp: 1 Inhaler, Rfl: 0;  amantadine (SYMMETREL) 100 MG capsule, Take 1 capsule (100 mg total) by mouth 2 (two) times daily., Disp: 60 capsule, Rfl: 0;  beclomethasone (QVAR) 40 MCG/ACT inhaler, Inhale 2 puffs into the lungs 2 (two) times daily., Disp: , Rfl:  paliperidone (INVEGA) 6 MG 24 hr tablet, Take 12 mg by mouth daily., Disp: , Rfl: ;  ranitidine (ZANTAC) 150 MG tablet, Take 150 mg by mouth 2 (two) times daily., Disp: , Rfl:    Assessment Axis I   Chronic Paranoid Schizophrenia Axis II deferred Axis III  Fibromyalgia, Asthma, Bipolar 1 d/o, Schizophrenia, Tobacco abuse Axis IV other psychosocial or environmental problems, problems related to social environment and problems with primary support group  Axis V  21-30 behavior considerably influenced by delusions or hallucinations OR serious impairment in judgment, communication OR inability to function in almost all areas  Plan:  We will continue to administer her medications as ordered, Invega  12 mg po daily for Psychosis We will offer her the rest of her medications for her other medical condition.   Charmaine Downs   PMHNP-BC   Patient was seen face-to-face for this evaluation, case discussed with a physician extender and developed treatment plans. Reviewed the information documented and agree with the treatment plan.  Karen Best  09/18/2013 5:03 PM

## 2013-09-18 NOTE — Progress Notes (Addendum)
Updated psych consult note faxed to [facility] per request from [facility].  Follow-up calls placed to the following: [facility]- per Juliann Pulse at capacity and not reviewing referrals right now [facility]- per Lisabeth Pick currently at capacity and referral has not been reviewed [facility]- pt still under review [facility]- per [place] currently at capacity but would review tomorrow [facility]- no answer [facility]- per Lonn [place] no referrals have been read today [facility]- per Eustaquio Maize only gero female beds available    Rick Duff Disposition [facility]

## 2013-09-18 NOTE — Progress Notes (Signed)
Spoke with Karen Best at [facility], states she ran referral by their psychiatrist and pt has been declined because they feel as though pt does not meet their criteria because pt is, "just hallucinating".   Rick Duff Disposition [facility]

## 2013-09-18 NOTE — Progress Notes (Signed)
[facility] contacted the following facilities for inpatient placement:  1)[facility]-faxed referral 2)[facility]-faxed referral 3)[facility]-faxed referral 4)[facility]-faxed referral 5)[facility]-no beds 6)[facility]-faxed referral 7)[facility]-no beds 8)[facility]-faxed referral 9)[facility]-no beds  Follow up:  1)[facility]-MD to look at in AM 2)[facility]-Under review 3)[facility]-no beds 4)[facility]-unable to find referral; refaxed 5)[facility]-no beds  Wyvonnia Dusky, MHT/NS

## 2013-09-18 NOTE — ED Notes (Signed)
Pt denies SI or HI, will continue to monitor for safety. 

## 2013-09-19 DIAGNOSIS — F200 Paranoid schizophrenia: Secondary | ICD-10-CM

## 2013-09-19 NOTE — Consult Note (Signed)
[facility] Face-to-Face Psychiatry Consult   Reason for Consult:  Psychosis Referring Physician:  EDP Karen Best is an 58 y.o. female. Total Time spent with patient: 20 minutes  Assessment: AXIS I:  Chronic Paranoid Schizophrenia AXIS II:  Deferred AXIS III:   Past Medical History  Diagnosis Date  . Fibromyalgia   . Schizophrenia   . Asthma   . Bipolar 1 disorder   . Tobacco abuse    AXIS IV:  other psychosocial or environmental problems, problems related to social environment and problems with primary support group AXIS V:  35  Plan:  Recommend psychiatric Inpatient admission when medically cleared.   Subjective:   Karen Best is a 58 y.o. female patient admitted with psychosis  HPI:  Patient continues to have paranoia but hallucinations are not prominent. Still remains circumstantial. Does not endorse hopelessness. Affect remains blunt. Poor insight. HPI Elements:   Location:  generalized . Quality:  acute. Severity:  severe. Timing:  constant. Duration:  several weeks. Context:  stressors, unknown adhearance to medications.  Past Psychiatric History: Past Medical History  Diagnosis Date  . Fibromyalgia   . Schizophrenia   . Asthma   . Bipolar 1 disorder   . Tobacco abuse     reports that she has been smoking.  She does not have any smokeless tobacco history on file. She reports that she does not drink alcohol or use illicit drugs. History reviewed. No pertinent family history.         Allergies:   Allergies  Allergen Reactions  . Penicillins     unknown    ACT Assessment Complete:  Yes:    Educational Status    Risk to Self: Risk to self Is patient at risk for suicide?: No, but patient needs Medical Clearance Substance abuse history and/or treatment for substance abuse?: No  Risk to Others:    Abuse:    Prior Inpatient Therapy:    Prior Outpatient Therapy:    Additional Information:                    Objective: Blood  pressure 104/76, pulse 76, temperature 97.5 F (36.4 C), temperature source Oral, resp. rate 18, SpO2 100.00%.There is no weight on file to calculate BMI. No results found for this or any previous visit (from the past 72 hour(s)). Labs are reviewed and are pertinent for medical issues being managed  Current Facility-Administered Medications  Medication Dose Route Frequency Provider Last Rate Last Dose  . acetaminophen (TYLENOL) tablet 650 mg  650 mg Oral Q6H PRN Lurena Nida, NP      . amantadine (SYMMETREL) capsule 100 mg  100 mg Oral BID Lurena Nida, NP   100 mg at 09/19/13 3382  . famotidine (PEPCID) tablet 20 mg  20 mg Oral BID Lurena Nida, NP   20 mg at 09/19/13 5053  . paliperidone (INVEGA) 24 hr tablet 12 mg  12 mg Oral Daily Lurena Nida, NP   12 mg at 09/19/13 9767   Current Outpatient Prescriptions  Medication Sig Dispense Refill  . albuterol (PROVENTIL HFA;VENTOLIN HFA) 108 (90 BASE) MCG/ACT inhaler Inhale 1-2 puffs into the lungs every 6 (six) hours as needed for wheezing.  1 Inhaler  0  . amantadine (SYMMETREL) 100 MG capsule Take 1 capsule (100 mg total) by mouth 2 (two) times daily.  60 capsule  0  . beclomethasone (QVAR) 40 MCG/ACT inhaler Inhale 2 puffs into the lungs 2 (two)  times daily.      . paliperidone (INVEGA) 6 MG 24 hr tablet Take 12 mg by mouth daily.      . ranitidine (ZANTAC) 150 MG tablet Take 150 mg by mouth 2 (two) times daily.        Psychiatric Specialty Exam:     Blood pressure 104/76, pulse 76, temperature 97.5 F (36.4 C), temperature source Oral, resp. rate 18, SpO2 100.00%.There is no weight on file to calculate BMI.  General Appearance: Disheveled  Eye Contact::  Minimal  Speech:  Garbled  Volume:  Decreased  Mood:  Depressed  Affect:  Blunt  Thought Process:  Coherent  Orientation:  Full (Time, Place, and Person)  Thought Content:  Delusions, Hallucinations: Auditory Visual and Paranoid Ideation  Suicidal Thoughts:  No  Homicidal  Thoughts:  No  Memory:  Immediate;   Fair Recent;   Fair Remote;   Fair  Judgement:  Impaired  Insight:  Lacking  Psychomotor Activity:  Decreased  Concentration:  Fair  Recall:  [employer] of Knowledge:Fair  Language: Good  Akathisia:  No  Handed:  Left  AIMS (if indicated):     Assets:  Housing Resilience Social Support  Sleep:      Musculoskeletal: Strength & Muscle Tone: within normal limits Gait & Station: normal Patient leans: N/A  Treatment Plan Summary: Daily contact with patient to assess and evaluate symptoms and progress in treatment Medication management; inpatient hospitalization for stabilization.  Continue invega for now.  Merian Capron MD 09/19/2013 11:18 AM

## 2013-09-19 NOTE — BHH Counselor (Addendum)
Dr De Nurse placed pt under IVC. IVC paperwork faxed to night magistrate. Day magistrate's office is closed d/t holiday today. Magistrate Arnoldo Morale confirmed receipt of paperwork. [employer] officer arrives but doesn't have Findings & Custody order. Officer leaves and will return with proper paperwork.   Arnold Long, [place] Assessment Counselor

## 2013-09-19 NOTE — ED Notes (Signed)
Gave report to phyllis rn at [facility], awaiting sheriff to tx

## 2013-09-19 NOTE — BHH Counselor (Addendum)
Sheriff's dept has arrived to transport pt to [place]. RN Estill Bamberg to notify Silva Bandy at [facility]   Arnold Long, [place] Assessment Counselor 3:29 pm   Per pt's RN Estill Bamberg, Sheriff Pascall reports pt won't be transported until tomorrow d/t lack of transportation staff at [employer] [facility] office. Writer called and spoke w/ Silva Bandy at [facility] [phone]   . Silva Bandy states she will hold bed for pt until tomorrow 09/20/13.   Arnold Long, [place] Assessment Counselor 2:35 pm

## 2013-09-19 NOTE — BH Assessment (Signed)
Patient accepted to [facility] pending IVC. Fax IVC once completed to [phone]. Call Silva Bandy [phone] to obtain the accepting physicians name and call report # after IVC is completed and faxed to [facility]. Runell Gess. Writer updated Paige with the above disposition.

## 2013-09-19 NOTE — Progress Notes (Signed)
MHT completed follow up at the following inpatient hospitals:  1)[facility]-under review 2)[facility]-refaxed referral unable to find the initial fax received 3)[facility]-declined previously 4)[facility]-under review 5)[facility]-under review 6)[facility]-will review in AM 7)[facility] Marae Cottrell, MHT/NS

## 2013-09-20 NOTE — ED Provider Notes (Signed)
Medical screening examination/treatment/procedure(s) were performed by non-physician practitioner and as supervising physician I was immediately available for consultation/collaboration.   EKG Interpretation None        Ephraim Hamburger, MD 09/20/13 1655

## 2013-10-09 ENCOUNTER — Encounter (HOSPITAL_COMMUNITY): Payer: Self-pay | Admitting: Emergency Medicine

## 2013-10-09 ENCOUNTER — Emergency Department (HOSPITAL_COMMUNITY)
Admission: EM | Admit: 2013-10-09 | Discharge: 2013-10-09 | Disposition: A | Discharge status: Home / Self Care | Payer: Medicaid Other | Attending: Emergency Medicine | Admitting: Emergency Medicine

## 2013-10-09 DIAGNOSIS — R443 Hallucinations, unspecified: Secondary | ICD-10-CM | POA: Insufficient documentation

## 2013-10-09 DIAGNOSIS — Z880 Allergy status to penicillin: Secondary | ICD-10-CM | POA: Diagnosis not present

## 2013-10-09 DIAGNOSIS — F1410 Cocaine abuse, uncomplicated: Secondary | ICD-10-CM | POA: Diagnosis not present

## 2013-10-09 DIAGNOSIS — IMO0002 Reserved for concepts with insufficient information to code with codable children: Secondary | ICD-10-CM | POA: Diagnosis not present

## 2013-10-09 DIAGNOSIS — J45909 Unspecified asthma, uncomplicated: Secondary | ICD-10-CM | POA: Diagnosis not present

## 2013-10-09 DIAGNOSIS — F919 Conduct disorder, unspecified: Secondary | ICD-10-CM | POA: Diagnosis not present

## 2013-10-09 DIAGNOSIS — F209 Schizophrenia, unspecified: Secondary | ICD-10-CM | POA: Insufficient documentation

## 2013-10-09 DIAGNOSIS — F319 Bipolar disorder, unspecified: Secondary | ICD-10-CM | POA: Insufficient documentation

## 2013-10-09 DIAGNOSIS — G478 Other sleep disorders: Secondary | ICD-10-CM | POA: Diagnosis not present

## 2013-10-09 DIAGNOSIS — F17200 Nicotine dependence, unspecified, uncomplicated: Secondary | ICD-10-CM | POA: Diagnosis not present

## 2013-10-09 DIAGNOSIS — Z008 Encounter for other general examination: Secondary | ICD-10-CM | POA: Diagnosis present

## 2013-10-09 DIAGNOSIS — Z79899 Other long term (current) drug therapy: Secondary | ICD-10-CM | POA: Insufficient documentation

## 2013-10-09 LAB — COMPREHENSIVE METABOLIC PANEL
ALT: 31 U/L (ref 0–35)
AST: 28 U/L (ref 0–37)
Albumin: 4 g/dL (ref 3.5–5.2)
Alkaline Phosphatase: 74 U/L (ref 39–117)
BUN: 4 mg/dL — ABNORMAL LOW (ref 6–23)
CO2: 24 mEq/L (ref 19–32)
Calcium: 9.6 mg/dL (ref 8.4–10.5)
Chloride: 105 mEq/L (ref 96–112)
Creatinine, Ser: 0.66 mg/dL (ref 0.50–1.10)
GFR calc Af Amer: >90 mL/min (ref >90)
GFR calc non Af Amer: >90 mL/min (ref >90)
Glucose, Bld: 102 mg/dL — ABNORMAL HIGH (ref 70–99)
Potassium: 3.7 mEq/L (ref 3.7–5.3)
Sodium: 140 mEq/L (ref 137–147)
Total Bilirubin: 0.3 mg/dL (ref 0.3–1.2)
Total Protein: 8.5 g/dL — ABNORMAL HIGH (ref 6.0–8.3)

## 2013-10-09 LAB — CBC
HCT: 42.5 % (ref 36.0–46.0)
Hemoglobin: 13.2 g/dL (ref 12.0–15.0)
MCH: 23.2 pg — ABNORMAL LOW (ref 26.0–34.0)
MCHC: 31.1 g/dL (ref 30.0–36.0)
MCV: 74.7 fL — ABNORMAL LOW (ref 78.0–100.0)
Platelets: 263 10*3/uL (ref 150–400)
RBC: 5.69 MIL/uL — ABNORMAL HIGH (ref 3.87–5.11)
RDW: 15.4 % (ref 11.5–15.5)
WBC: 4.2 10*3/uL (ref 4.0–10.5)

## 2013-10-09 LAB — RAPID URINE DRUG SCREEN, HOSP PERFORMED
Amphetamines: NOT DETECTED
Barbiturates: NOT DETECTED
Benzodiazepines: NOT DETECTED
Cocaine: POSITIVE — AB
Opiates: NOT DETECTED
Tetrahydrocannabinol: NOT DETECTED

## 2013-10-09 LAB — ETHANOL: Alcohol, Ethyl (B): <11 mg/dL (ref 0–11)

## 2013-10-09 LAB — ACETAMINOPHEN LEVEL: Acetaminophen (Tylenol), Serum: <15 ug/mL (ref 10–30)

## 2013-10-09 LAB — SALICYLATE LEVEL: Salicylate Lvl: <2 mg/dL — ABNORMAL LOW (ref 2.8–20.0)

## 2013-10-09 MED ORDER — ACETAMINOPHEN 325 MG PO TABS: 650.0000 mg | ORAL_TABLET | ORAL | Status: DC | PRN

## 2013-10-09 MED ORDER — ZOLPIDEM TARTRATE 5 MG PO TABS: 5.0000 mg | ORAL_TABLET | Freq: Every evening | ORAL | Status: DC | PRN

## 2013-10-09 MED ORDER — NICOTINE 21 MG/24HR TD PT24
21.0000 mg | MEDICATED_PATCH | Freq: Every day | TRANSDERMAL | Status: DC
  Administered 2013-10-09: 21 mg via TRANSDERMAL
  Filled 2013-10-09: qty 1

## 2013-10-09 MED ORDER — ONDANSETRON HCL 4 MG PO TABS: 4.0000 mg | ORAL_TABLET | Freq: Three times a day (TID) | ORAL | Status: DC | PRN

## 2013-10-09 MED ORDER — IBUPROFEN 200 MG PO TABS: 600.0000 mg | ORAL_TABLET | Freq: Three times a day (TID) | ORAL | Status: DC | PRN

## 2013-10-09 MED ORDER — LORAZEPAM 1 MG PO TABS: 1.0000 mg | ORAL_TABLET | Freq: Three times a day (TID) | ORAL | Status: DC | PRN

## 2013-10-09 MED ORDER — ALUM & MAG HYDROXIDE-SIMETH 200-200-20 MG/5ML PO SUSP: 30.0000 mL | ORAL | Status: DC | PRN

## 2013-10-09 NOTE — ED Notes (Addendum)
Pt's clothing, purse, cigarettes and cell phone locked in locker #29.  At patient's request - cell phone turned off.
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
## 2013-10-09 NOTE — ED Provider Notes (Signed)
Medical screening examination/treatment/procedure(s) were performed by non-physician practitioner and as supervising physician I was immediately available for consultation/collaboration.   EKG Interpretation None       Fredia Sorrow, MD 10/09/13 1549

## 2013-10-09 NOTE — ED Notes (Addendum)
Pt presents to ED requesting detox from crack cocaine.  Pt's last use was this past Friday.  Pt states she also drinks alcohol and she believes her last drink was Friday.  Pt denies SI/HI.  Pt denies other complaints at this time.  Pt states she hears voices occasionally.  Pt states she knows she shouldn't use crack, but she can't help but go get it.

## 2013-10-09 NOTE — ED Notes (Addendum)
Karen Best brought pt in. Pt was seein ED on 5/22 for med clearance, sent to [facility]. Pt reports she has been drinking and smoking crack recently. Last crack use on Friday. Pt denies pain. Pt reports auditory and visual hallucinations. Pt unable to stay in her apartment right now because she is scared and it is too quiet. Pt having manic behavior. Giving food away, not sitting still, talking fast and not making sense.

## 2013-10-09 NOTE — ED Provider Notes (Signed)
CSN: [id number]     Arrival date & time 10/09/13  1407 History   First MD Initiated Contact with Patient 10/09/13 1436     Chief Complaint  Patient presents with  . Medical Clearance     (Consider location/radiation/quality/duration/timing/severity/associated sxs/prior Treatment) HPI Pt is a 58yo female with hx of fibromyalgia, schizophrenia, bipolar 1 disorder, asthma, and tobacco abuse presenting to ED requesting detox from crack cocaine. Pt also requesting help as she states she feels very "jittery," cannot sleep. States she feels "on-edge" states a lot of things "get to her" very quickly recently.  Reports being started on Haldol but states she cannot use the needles she was given. States she can only take the pills and returned the needles to the pharmacy.  States she has been having auditory and visual hallucinations that are non-threatening.  States she wakes up watching a woman on her television talking to her but the television is not on. Pt is vague about what her hallucinations say.  States she was at [facility] for mental health for 3 weeks recently and does not believe she was ready to leave.  Pt does not know where [facility] is located.  Pt also admits to drinking more alcohol recently and would like help with detox from cocaine.  Last use of cocaine and alcohol Friday, 6/12.  Denies SI/HI.  Pt is requesting to speak with TTS. States she thinks she needs to be admitted to behavioral health. States she has been here in the past for mental health related issues.    Past Medical History  Diagnosis Date  . Fibromyalgia   . Schizophrenia   . Asthma   . Bipolar 1 disorder   . Tobacco abuse    Past Surgical History  Procedure Laterality Date  . Cesarean section    . Tubal ligation     History reviewed. No pertinent family history. History  Substance Use Topics  . Smoking status: Current Every Day Smoker  . Smokeless tobacco: Not on file  . Alcohol Use: Yes   OB History   Grav Para Term Preterm Abortions TAB SAB Ect Mult Living                 Review of Systems  Respiratory: Negative for cough and shortness of breath.   Cardiovascular: Negative for chest pain and palpitations.  Gastrointestinal: Negative for nausea, vomiting and abdominal pain.  Psychiatric/Behavioral: Positive for hallucinations, behavioral problems, sleep disturbance and agitation. Negative for suicidal ideas. The patient is nervous/anxious and is hyperactive.   All other systems reviewed and are negative.     Allergies  Penicillins  Home Medications   Prior to Admission medications   Medication Sig Start Date End Date Taking? Authorizing Provider  beclomethasone (QVAR) 40 MCG/ACT inhaler Inhale 2 puffs into the lungs 2 (two) times daily.   Yes Historical Provider, MD  haloperidol (HALDOL) 1 MG tablet Take 1 mg by mouth 3 (three) times daily.   Yes Historical Provider, MD  ibuprofen (ADVIL,MOTRIN) 200 MG tablet Take 1,200 mg by mouth every 6 (six) hours as needed for moderate pain (RA).   Yes Historical Provider, MD   BP 165/91  Pulse 69  Temp(Src) 98.3 F (36.8 C) (Oral)  Resp 16  SpO2 97% Physical Exam  Nursing note and vitals reviewed. Constitutional: She appears well-developed and well-nourished. No distress.  Pt sitting on exam bed, NAD. Appears mildly anxious and jittery.  HENT:  Head: Normocephalic and atraumatic.  Eyes: Conjunctivae are  normal. No scleral icterus.  Neck: Normal range of motion.  Cardiovascular: Normal rate, regular rhythm and normal heart sounds.   Pulmonary/Chest: Effort normal and breath sounds normal. No respiratory distress. She has no wheezes. She has no rales. She exhibits no tenderness.  Abdominal: Soft. Bowel sounds are normal. She exhibits no distension and no mass. There is no tenderness. There is no rebound and no guarding.  Musculoskeletal: Normal range of motion.  Neurological: She is alert.  Skin: Skin is warm and dry. She is not  diaphoretic.  Psychiatric: Her speech is normal. Her mood appears anxious. She is hyperactive. She is not agitated, not aggressive and not actively hallucinating. She expresses no homicidal and no suicidal ideation. She expresses no suicidal plans and no homicidal plans.    ED Course  Procedures (including critical care time) Labs Review Labs Reviewed  CBC - Abnormal; Notable for the following:    RBC 5.69 (*)    MCV 74.7 (*)    MCH 23.2 (*)    All other components within normal limits  COMPREHENSIVE METABOLIC PANEL - Abnormal; Notable for the following:    Glucose, Bld 102 (*)    BUN 4 (*)    Total Protein 8.5 (*)    All other components within normal limits  SALICYLATE LEVEL - Abnormal; Notable for the following:    Salicylate Lvl <2.6 (*)    All other components within normal limits  URINE RAPID DRUG SCREEN (HOSP PERFORMED) - Abnormal; Notable for the following:    Cocaine POSITIVE (*)    All other components within normal limits  ACETAMINOPHEN LEVEL  ETHANOL    Imaging Review No results found.   EKG Interpretation None      MDM   Final diagnoses:  None    Pt is a 58yo female with hx of schizophrenia and bipolar disorder requesting help with detox from crack cocaine and alcohol. Pt also requesting help with feelings of agitation and hallucinations.  Deneis SI/HI.  Medical clearance orders placed. Psych hold orders placed for pt to be evaluated by TTS.    Pt is medically cleared pending labs.  3:44 PM Pt is medically cleared to be evaluated by TTS. Pt signed out to [employer] PA-C at shift change. Plan is to f/u with TTS to determine pt's disposition.        Noland Fordyce, PA-C 10/09/13 1545

## 2013-10-09 NOTE — Discharge Instructions (Signed)
Return to the Emergency Department if you develop thoughts of hurting yourself or hurting others Hallucinations and Delusions You seem to be having hallucinations and/or delusions. You may be hearing voices that no one else can hear. This can seem very real to you. You may be having thoughts and fears that do not make sense to others. This condition can be due to mental disease like schizophrenia. It may be caused by a medical condition, such as an infection or electrolyte disturbance. These symptoms are also seen in drug abusers, especially those who use crack cocaine and amphetamines. Drugs like PCP, LSD, MDMA, peyote, and psilocybin can also cause frightening hallucinations and loss of control. If your symptoms are due to drug abuse, your mental state should improve as the drug(s) leave your system. Someone you trust should be with you until you are better to protect you and calm your fears. Often tranquilizers are very helpful at controlling hallucinations, anxiety, and destructive behavior. Getting a proper diet and enough sleep is important to recovery. If your symptoms are not due to drugs, or do not improve over several days after stopping drug use, you need further medical or mental health care. SEEK IMMEDIATE MEDICAL CARE IF:   Your symptoms get worse, especially if you think your life is in danger  You have violent or destructive thoughts. Recovery is possible, but you have to get proper treatment and avoid drugs that are known to cause you trouble. Document Released: 05/22/2004 Document Revised: 07/07/2011 Document Reviewed: 04/14/2005 [facility] Patient Information 2014 [place].

## 2013-10-20 ENCOUNTER — Emergency Department (HOSPITAL_COMMUNITY)
Admission: EM | Admit: 2013-10-20 | Discharge: 2013-10-20 | Disposition: A | Discharge status: Home / Self Care | Payer: Medicaid Other | Attending: Emergency Medicine | Admitting: Emergency Medicine

## 2013-10-20 ENCOUNTER — Emergency Department (HOSPITAL_COMMUNITY): Payer: Medicaid Other

## 2013-10-20 DIAGNOSIS — Z880 Allergy status to penicillin: Secondary | ICD-10-CM | POA: Insufficient documentation

## 2013-10-20 DIAGNOSIS — F1410 Cocaine abuse, uncomplicated: Secondary | ICD-10-CM | POA: Insufficient documentation

## 2013-10-20 DIAGNOSIS — R079 Chest pain, unspecified: Secondary | ICD-10-CM

## 2013-10-20 DIAGNOSIS — F17200 Nicotine dependence, unspecified, uncomplicated: Secondary | ICD-10-CM | POA: Insufficient documentation

## 2013-10-20 DIAGNOSIS — F319 Bipolar disorder, unspecified: Secondary | ICD-10-CM | POA: Insufficient documentation

## 2013-10-20 DIAGNOSIS — Z79899 Other long term (current) drug therapy: Secondary | ICD-10-CM | POA: Insufficient documentation

## 2013-10-20 DIAGNOSIS — J45909 Unspecified asthma, uncomplicated: Secondary | ICD-10-CM | POA: Insufficient documentation

## 2013-10-20 LAB — CBC
HCT: 40.3 % (ref 36.0–46.0)
Hemoglobin: 12.6 g/dL (ref 12.0–15.0)
MCH: 23.4 pg — ABNORMAL LOW (ref 26.0–34.0)
MCHC: 31.3 g/dL (ref 30.0–36.0)
MCV: 74.8 fL — ABNORMAL LOW (ref 78.0–100.0)
Platelets: 247 10*3/uL (ref 150–400)
RBC: 5.39 MIL/uL — ABNORMAL HIGH (ref 3.87–5.11)
RDW: 16.2 % — ABNORMAL HIGH (ref 11.5–15.5)
WBC: 6.1 10*3/uL (ref 4.0–10.5)

## 2013-10-20 LAB — BASIC METABOLIC PANEL
BUN: 6 mg/dL (ref 6–23)
CO2: 23 mEq/L (ref 19–32)
Calcium: 9.6 mg/dL (ref 8.4–10.5)
Chloride: 101 mEq/L (ref 96–112)
Creatinine, Ser: 0.53 mg/dL (ref 0.50–1.10)
GFR calc Af Amer: >90 mL/min (ref >90)
GFR calc non Af Amer: >90 mL/min (ref >90)
Glucose, Bld: 129 mg/dL — ABNORMAL HIGH (ref 70–99)
Potassium: 4 mEq/L (ref 3.7–5.3)
Sodium: 137 mEq/L (ref 137–147)

## 2013-10-20 LAB — TROPONIN I: Troponin I: <0.3 ng/mL (ref <0.30)

## 2013-10-20 LAB — I-STAT TROPONIN, ED: Troponin i, poc: 0 ng/mL (ref 0.00–0.08)

## 2013-10-20 NOTE — ED Notes (Addendum)
Pt states that she woke this morning at 0200 with sharp chest pain. Pt's last use of cocaine was yesterday. Pt reports doing a "dime bag".

## 2013-10-20 NOTE — Discharge Instructions (Signed)
Chemical Dependency Chemical dependency is an addiction to drugs or alcohol. It is characterized by the repeated behavior of seeking out and using drugs and alcohol despite harmful consequences to the health and safety of ones self and others.  RISK FACTORS There are certain situations or behaviors that increase a person's risk for chemical dependency. These include:  A family history of chemical dependency.  A history of mental health issues, including depression and anxiety.  A home environment where drugs and alcohol are easily available to you.  Drug or alcohol use at a young age. SYMPTOMS  The following symptoms can indicate chemical dependency:  Inability to limit the use of drugs or alcohol.  Nausea, sweating, shakiness, and anxiety that occurs when alcohol or drugs are not being used.  An increase in amount of drugs or alcohol that is necessary to get drunk or high. People who experience these symptoms can assess their use of drugs and alcohol by asking themselves the following questions:  Have you been told by friends or family that they are worried about your use of alcohol or drugs?  Do friends and family ever tell you about things you did while drinking alcohol or using drugs that you do not remember?  Do you lie about using alcohol or drugs or about the amounts you use?  Do you have difficulty completing daily tasks unless you use alcohol or drugs?  Is the level of your work or school performance lower because of your drug or alcohol use?  Do you get sick from using drugs or alcohol but keep using anyway?  Do you feel uncomfortable in social situations unless you use alcohol or drugs?  Do you use drugs or alcohol to help forget problems? An answer of yes to any of these questions may indicate chemical dependency. Professional evaluation is suggested. Document Released: 04/08/2001 Document Revised: 07/07/2011 Document Reviewed: 06/20/2010 [facility] Patient  Information 2015 [place], [place]. This information is not intended to replace advice given to you by your health care provider. Make sure you discuss any questions you have with your health care provider.  Chest Pain (Nonspecific) It is often hard to give a specific diagnosis for the cause of chest pain. There is always a chance that your pain could be related to something serious, such as a heart attack or a blood clot in the lungs. You need to follow up with your health care provider for further evaluation. CAUSES   Heartburn.  Pneumonia or bronchitis.  Anxiety or stress.  Inflammation around your heart (pericarditis) or lung (pleuritis or pleurisy).  A blood clot in the lung.  A collapsed lung (pneumothorax). It can develop suddenly on its own (spontaneous pneumothorax) or from trauma to the chest.  Shingles infection (herpes zoster virus). The chest wall is composed of bones, muscles, and cartilage. Any of these can be the source of the pain.  The bones can be bruised by injury.  The muscles or cartilage can be strained by coughing or overwork.  The cartilage can be affected by inflammation and become sore (costochondritis). DIAGNOSIS  Lab tests or other studies may be needed to find the cause of your pain. Your health care provider may have you take a test called an ambulatory electrocardiogram (ECG). An ECG records your heartbeat patterns over a 24-hour period. You may also have other tests, such as:  Transthoracic echocardiogram (TTE). During echocardiography, sound waves are used to evaluate how blood flows through your heart.  Transesophageal echocardiogram (TEE).  Cardiac  monitoring. This allows your health care provider to monitor your heart rate and rhythm in real time.  Holter monitor. This is a portable device that records your heartbeat and can help diagnose heart arrhythmias. It allows your health care provider to track your heart activity for several days, if  needed.  Stress tests by exercise or by giving medicine that makes the heart beat faster. TREATMENT   Treatment depends on what may be causing your chest pain. Treatment may include:  Acid blockers for heartburn.  Anti-inflammatory medicine.  Pain medicine for inflammatory conditions.  Antibiotics if an infection is present.  You may be advised to change lifestyle habits. This includes stopping smoking and avoiding alcohol, caffeine, and chocolate.  You may be advised to keep your head raised (elevated) when sleeping. This reduces the chance of acid going backward from your stomach into your esophagus. Most of the time, nonspecific chest pain will improve within 2-3 days with rest and mild pain medicine.  HOME CARE INSTRUCTIONS   If antibiotics were prescribed, take them as directed. Finish them even if you start to feel better.  For the next few days, avoid physical activities that bring on chest pain. Continue physical activities as directed.  Do not use any tobacco products, including cigarettes, chewing tobacco, or electronic cigarettes.  Avoid drinking alcohol.  Only take medicine as directed by your health care provider.  Follow your health care provider's suggestions for further testing if your chest pain does not go away.  Keep any follow-up appointments you made. If you do not go to an appointment, you could develop lasting (chronic) problems with pain. If there is any problem keeping an appointment, call to reschedule. SEEK MEDICAL CARE IF:   Your chest pain does not go away, even after treatment.  You have a rash with blisters on your chest.  You have a fever. SEEK IMMEDIATE MEDICAL CARE IF:   You have increased chest pain or pain that spreads to your arm, neck, jaw, back, or abdomen.  You have shortness of breath.  You have an increasing cough, or you cough up blood.  You have severe back or abdominal pain.  You feel nauseous or vomit.  You have severe  weakness.  You faint.  You have chills. This is an emergency. Do not wait to see if the pain will go away. Get medical help at once. Call your local emergency services (911 in [place] Do not drive yourself to the hospital. MAKE SURE YOU:   Understand these instructions.  Will watch your condition.  Will get help right away if you are not doing well or get worse. Document Released: 01/22/2005 Document Revised: 04/19/2013 Document Reviewed: 11/18/2007 [facility] Patient Information 2015 [place], [place]. This information is not intended to replace advice given to you by your health care provider. Make sure you discuss any questions you have with your health care provider.

## 2013-10-20 NOTE — ED Notes (Signed)
To ED with c/o chest pain that started this am, states that it woke her up. Midsternal, no nausea/vomiting. Discharged yesterday from [facility]

## 2013-10-20 NOTE — ED Provider Notes (Signed)
CSN: [id number]     Arrival date & time 10/20/13  1543 History   First MD Initiated Contact with Patient 10/20/13 2032     Chief Complaint  Patient presents with  . Chest Pain     (Consider location/radiation/quality/duration/timing/severity/associated sxs/prior Treatment) Patient is a 58 y.o. female presenting with chest pain. The history is provided by the patient.  Chest Pain Associated symptoms: no abdominal pain, no back pain, no headache, no nausea, no numbness, no shortness of breath, not vomiting and no weakness    patient presented with sharp left-sided chest pain. States began to the morning this morning. Patient is feeling somewhat better now. She's had an occasional cough. She last used cocaine last night. She states she just got out of somewhere in [place] for her "mental problems". She states she's never had chest pain before, however she has had previous visits to the ER for chest pain. No known coronary disease. She does smoke cigarettes and crack.  Past Medical History  Diagnosis Date  . Fibromyalgia   . Schizophrenia   . Asthma   . Bipolar 1 disorder   . Tobacco abuse    Past Surgical History  Procedure Laterality Date  . Cesarean section    . Tubal ligation     No family history on file. History  Substance Use Topics  . Smoking status: Current Every Day Smoker  . Smokeless tobacco: Not on file  . Alcohol Use: Yes   OB History   Grav Para Term Preterm Abortions TAB SAB Ect Mult Living                 Review of Systems  Constitutional: Negative for activity change and appetite change.  Eyes: Negative for pain.  Respiratory: Negative for chest tightness and shortness of breath.   Cardiovascular: Positive for chest pain. Negative for leg swelling.  Gastrointestinal: Negative for nausea, vomiting, abdominal pain and diarrhea.  Genitourinary: Negative for flank pain.  Musculoskeletal: Negative for back pain and neck stiffness.  Skin: Negative for rash.   Neurological: Negative for weakness, numbness and headaches.  Psychiatric/Behavioral: Negative for hallucinations and behavioral problems. The patient is not hyperactive.       Allergies  Penicillins  Home Medications   Prior to Admission medications   Medication Sig Start Date End Date Taking? Authorizing Provider  albuterol (PROVENTIL HFA;VENTOLIN HFA) 108 (90 BASE) MCG/ACT inhaler Inhale 2 puffs into the lungs every 4 (four) hours as needed for shortness of breath.   Yes Historical Provider, MD  amantadine (SYMMETREL) 100 MG capsule Take 100 mg by mouth 2 (two) times daily.   Yes Historical Provider, MD  beclomethasone (QVAR) 40 MCG/ACT inhaler Inhale 2 puffs into the lungs 2 (two) times daily.   Yes Historical Provider, MD  benztropine (COGENTIN) 1 MG tablet Take 1 mg by mouth 2 (two) times daily.   Yes Historical Provider, MD  fluconazole (DIFLUCAN) 100 MG tablet Take 100 mg by mouth daily.   Yes Historical Provider, MD  haloperidol decanoate (HALDOL DECANOATE) 50 MG/ML injection Inject 50 mg into the muscle every 28 (twenty-eight) days.   Yes Historical Provider, MD  ibuprofen (ADVIL,MOTRIN) 200 MG tablet Take 1,200 mg by mouth every 6 (six) hours as needed for moderate pain (RA).   Yes Historical Provider, MD  lithium carbonate (ESKALITH) 450 MG CR tablet Take 450 mg by mouth 2 (two) times daily.   Yes Historical Provider, MD  paliperidone (INVEGA) 6 MG 24 hr tablet Take 6 mg  by mouth 2 (two) times daily.   Yes Historical Provider, MD  ranitidine (ZANTAC) 150 MG tablet Take 150 mg by mouth 2 (two) times daily.   Yes Historical Provider, MD   BP 99/64  Pulse 71  Temp(Src) 97.9 F (36.6 C) (Oral)  Resp 14  Wt 138 lb (62.596 kg)  SpO2 98% Physical Exam  Nursing note and vitals reviewed. Constitutional: She is oriented to person, place, and time. She appears well-developed and well-nourished.  HENT:  Head: Normocephalic and atraumatic.  Eyes: EOM are normal. Pupils are equal,  round, and reactive to light.  Neck: Normal range of motion. Neck supple.  Cardiovascular: Normal rate, regular rhythm and normal heart sounds.   No murmur heard. Pulmonary/Chest: Effort normal. No respiratory distress. She has wheezes. She has no rales.  Mild diffuse harsh breath sounds.  Abdominal: Soft. Bowel sounds are normal. She exhibits no distension. There is no tenderness. There is no rebound and no guarding.  Musculoskeletal: Normal range of motion.  Neurological: She is alert and oriented to person, place, and time. No cranial nerve deficit.  Skin: Skin is warm and dry.  Psychiatric: She has a normal mood and affect. Her speech is normal.    ED Course  Procedures (including critical care time) Labs Review Labs Reviewed  CBC - Abnormal; Notable for the following:    RBC 5.39 (*)    MCV 74.8 (*)    MCH 23.4 (*)    RDW 16.2 (*)    All other components within normal limits  BASIC METABOLIC PANEL - Abnormal; Notable for the following:    Glucose, Bld 129 (*)    All other components within normal limits  TROPONIN I  Randolm Idol, ED    Imaging Review Dg Chest 2 View  10/20/2013   CLINICAL DATA:  Chest pain.  EXAM: CHEST  2 VIEW  COMPARISON:  08/29/2013.  FINDINGS: The heart size and mediastinal contours are within normal limits. Both lungs are clear. The visualized skeletal structures are unremarkable.  IMPRESSION: No active cardiopulmonary disease.   Electronically Signed   By: Marcello Moores  Register   On: 10/20/2013 17:54     EKG Interpretation   Date/Time:  Thursday October 20 2013 15:56:38 EDT Ventricular Rate:  85 PR Interval:  146 QRS Duration: 86 QT Interval:  372 QTC Calculation: 442 R Axis:   -35 Text Interpretation:  Normal sinus rhythm Left axis deviation Abnormal ECG  Confirmed by PICKERING  MD, Ovid Curd [phone]) on 10/20/2013 8:33:30 PM      MDM   Final diagnoses:  Chest pain, unspecified chest pain type  Cocaine abuse     patient with chest pain. EKG  reassuring. Enzymes negative. Has been a large time since the onset. Will discharge home. Doubt cardiac cause.     Jasper Riling. Alvino Chapel, MD 10/20/13 2352

## 2013-10-20 NOTE — ED Notes (Signed)
Pt given Bag Lunch for [employer] w/ drink.

## 2013-12-30 ENCOUNTER — Emergency Department (HOSPITAL_COMMUNITY)
Admission: EM | Admit: 2013-12-30 | Discharge: 2013-12-30 | Disposition: A | Discharge status: Home / Self Care | Payer: Medicaid Other | Attending: Emergency Medicine | Admitting: Emergency Medicine

## 2013-12-30 ENCOUNTER — Encounter (HOSPITAL_COMMUNITY): Payer: Self-pay | Admitting: Emergency Medicine

## 2013-12-30 ENCOUNTER — Emergency Department (HOSPITAL_COMMUNITY): Payer: Medicaid Other

## 2013-12-30 DIAGNOSIS — Z8739 Personal history of other diseases of the musculoskeletal system and connective tissue: Secondary | ICD-10-CM | POA: Insufficient documentation

## 2013-12-30 DIAGNOSIS — F209 Schizophrenia, unspecified: Secondary | ICD-10-CM | POA: Diagnosis not present

## 2013-12-30 DIAGNOSIS — F319 Bipolar disorder, unspecified: Secondary | ICD-10-CM | POA: Diagnosis not present

## 2013-12-30 DIAGNOSIS — R55 Syncope and collapse: Secondary | ICD-10-CM

## 2013-12-30 DIAGNOSIS — J45909 Unspecified asthma, uncomplicated: Secondary | ICD-10-CM | POA: Insufficient documentation

## 2013-12-30 DIAGNOSIS — Z79899 Other long term (current) drug therapy: Secondary | ICD-10-CM | POA: Insufficient documentation

## 2013-12-30 DIAGNOSIS — F17200 Nicotine dependence, unspecified, uncomplicated: Secondary | ICD-10-CM | POA: Insufficient documentation

## 2013-12-30 DIAGNOSIS — Z880 Allergy status to penicillin: Secondary | ICD-10-CM | POA: Insufficient documentation

## 2013-12-30 DIAGNOSIS — R404 Transient alteration of awareness: Secondary | ICD-10-CM | POA: Insufficient documentation

## 2013-12-30 LAB — CBC WITH DIFFERENTIAL/PLATELET
Basophils Absolute: 0 10*3/uL (ref 0.0–0.1)
Basophils Relative: 0 % (ref 0–1)
Eosinophils Absolute: 0.3 10*3/uL (ref 0.0–0.7)
Eosinophils Relative: 8 % — ABNORMAL HIGH (ref 0–5)
HCT: 40.3 % (ref 36.0–46.0)
Hemoglobin: 12.7 g/dL (ref 12.0–15.0)
Lymphocytes Relative: 49 % — ABNORMAL HIGH (ref 12–46)
Lymphs Abs: 2.1 10*3/uL (ref 0.7–4.0)
MCH: 23.7 pg — ABNORMAL LOW (ref 26.0–34.0)
MCHC: 31.5 g/dL (ref 30.0–36.0)
MCV: 75.2 fL — ABNORMAL LOW (ref 78.0–100.0)
Monocytes Absolute: 0.5 10*3/uL (ref 0.1–1.0)
Monocytes Relative: 11 % (ref 3–12)
Neutro Abs: 1.3 10*3/uL — ABNORMAL LOW (ref 1.7–7.7)
Neutrophils Relative %: 32 % — ABNORMAL LOW (ref 43–77)
Platelets: 284 10*3/uL (ref 150–400)
RBC: 5.36 MIL/uL — ABNORMAL HIGH (ref 3.87–5.11)
RDW: 14.8 % (ref 11.5–15.5)
WBC: 4.2 10*3/uL (ref 4.0–10.5)

## 2013-12-30 LAB — URINALYSIS, ROUTINE W REFLEX MICROSCOPIC
Bilirubin Urine: NEGATIVE
Glucose, UA: NEGATIVE mg/dL
Ketones, ur: NEGATIVE mg/dL
Nitrite: POSITIVE — AB
Protein, ur: NEGATIVE mg/dL
Specific Gravity, Urine: 1.002 — ABNORMAL LOW (ref 1.005–1.030)
Urobilinogen, UA: 0.2 mg/dL (ref 0.0–1.0)
pH: 7 (ref 5.0–8.0)

## 2013-12-30 LAB — I-STAT TROPONIN, ED: Troponin i, poc: 0 ng/mL (ref 0.00–0.08)

## 2013-12-30 LAB — BASIC METABOLIC PANEL
Anion gap: 11 (ref 5–15)
BUN: 4 mg/dL — ABNORMAL LOW (ref 6–23)
CO2: 25 mEq/L (ref 19–32)
Calcium: 9.2 mg/dL (ref 8.4–10.5)
Chloride: 102 mEq/L (ref 96–112)
Creatinine, Ser: 0.72 mg/dL (ref 0.50–1.10)
GFR calc Af Amer: >90 mL/min (ref >90)
GFR calc non Af Amer: >90 mL/min (ref >90)
Glucose, Bld: 81 mg/dL (ref 70–99)
Potassium: 3.6 mEq/L — ABNORMAL LOW (ref 3.7–5.3)
Sodium: 138 mEq/L (ref 137–147)

## 2013-12-30 LAB — URINE MICROSCOPIC-ADD ON

## 2013-12-30 MED ORDER — CEPHALEXIN 500 MG PO CAPS: 500.0000 mg | ORAL_CAPSULE | Freq: Three times a day (TID) | ORAL | Status: DC

## 2013-12-30 MED ORDER — LIDOCAINE HCL (PF) 1 % IJ SOLN
INTRAMUSCULAR | Status: AC
  Administered 2013-12-30: 1.2 mL
  Filled 2013-12-30: qty 5

## 2013-12-30 MED ORDER — CEFTRIAXONE SODIUM 1 G IJ SOLR
1.0000 g | Freq: Once | INTRAMUSCULAR | Status: AC
  Administered 2013-12-30: 1 g via INTRAMUSCULAR
  Filled 2013-12-30: qty 10

## 2013-12-30 NOTE — ED Notes (Signed)
Pt reports having syncopal episodes for past 3-4 days. Pt reports that she gets chills then passes out and falls down. Pt reports symptoms intermittently for 1 month. Pt also reports headaches.

## 2013-12-30 NOTE — Discharge Instructions (Signed)
Take the prescribed medication as directed. °Follow-up with your primary care physician. °Return to the ED for new or worsening symptoms. ° °

## 2013-12-30 NOTE — ED Provider Notes (Signed)
CSN: [id number]     Arrival date & time 12/30/13  3244 History   First MD Initiated Contact with Patient 12/30/13 1014     Chief Complaint  Patient presents with  . Loss of Consciousness  . Chills     (Consider location/radiation/quality/duration/timing/severity/associated sxs/prior Treatment) The history is provided by the patient and medical records.   This is a 58 year old female with past medical history significant for asthma, bipolar disorder, schizophrenia, fibromyalgia, presenting to the ED for recurrent syncope.  Patient states the past month she has been having multiple syncopal episodes-- states she has the sensation that she was going to pass out, becomes chilled, and falls down. States she is unsure if she truly loses consciousness. Patient denies any chest pain, shortness of breath, palpitations, dizziness, unilateral weakness, numbness, paresthesias.  Patient has no prior history of seizure disorder, TIA, or stroke. She denies any recent changes in medications. No known fever or sick contacts.  Denies EtOH or illicit drug use.  VS stable on arrival.   Past Medical History  Diagnosis Date  . Fibromyalgia   . Schizophrenia   . Asthma   . Bipolar 1 disorder   . Tobacco abuse    Past Surgical History  Procedure Laterality Date  . Cesarean section    . Tubal ligation     No family history on file. History  Substance Use Topics  . Smoking status: Current Every Day Smoker  . Smokeless tobacco: Not on file  . Alcohol Use: Yes   OB History   Grav Para Term Preterm Abortions TAB SAB Ect Mult Living                 Review of Systems  Neurological: Positive for syncope.  All other systems reviewed and are negative.     Allergies  Penicillins  Home Medications   Prior to Admission medications   Medication Sig Start Date End Date Taking? Authorizing Provider  albuterol (PROVENTIL HFA;VENTOLIN HFA) 108 (90 BASE) MCG/ACT inhaler Inhale 2 puffs into the lungs every  4 (four) hours as needed for shortness of breath.    Historical Provider, MD  amantadine (SYMMETREL) 100 MG capsule Take 100 mg by mouth 2 (two) times daily.    Historical Provider, MD  beclomethasone (QVAR) 40 MCG/ACT inhaler Inhale 2 puffs into the lungs 2 (two) times daily.    Historical Provider, MD  benztropine (COGENTIN) 1 MG tablet Take 1 mg by mouth 2 (two) times daily.    Historical Provider, MD  fluconazole (DIFLUCAN) 100 MG tablet Take 100 mg by mouth daily.    Historical Provider, MD  haloperidol decanoate (HALDOL DECANOATE) 50 MG/ML injection Inject 50 mg into the muscle every 28 (twenty-eight) days.    Historical Provider, MD  ibuprofen (ADVIL,MOTRIN) 200 MG tablet Take 1,200 mg by mouth every 6 (six) hours as needed for moderate pain (RA).    Historical Provider, MD  lithium carbonate (ESKALITH) 450 MG CR tablet Take 450 mg by mouth 2 (two) times daily.    Historical Provider, MD  paliperidone (INVEGA) 6 MG 24 hr tablet Take 6 mg by mouth 2 (two) times daily.    Historical Provider, MD  ranitidine (ZANTAC) 150 MG tablet Take 150 mg by mouth 2 (two) times daily.    Historical Provider, MD   BP 131/91  Pulse 74  Temp(Src) 98 F (36.7 C) (Oral)  Resp 20  Ht 5\' 5"  (1.651 m)  Wt 138 lb (62.596 kg)  BMI 22.96 kg/m2  SpO2 99%  Physical Exam  Nursing note and vitals reviewed. Constitutional: She is oriented to person, place, and time. She appears well-developed and well-nourished. No distress.  HENT:  Head: Normocephalic and atraumatic.  Mouth/Throat: Oropharynx is clear and moist.  Eyes: Conjunctivae and EOM are normal. Pupils are equal, round, and reactive to light.  Neck: Normal range of motion. Neck supple.  Cardiovascular: Normal rate, regular rhythm and normal heart sounds.   Pulmonary/Chest: Effort normal and breath sounds normal. No respiratory distress. She has no wheezes.  Abdominal: Soft. Bowel sounds are normal. There is no tenderness. There is no guarding.   Musculoskeletal: Normal range of motion. She exhibits no edema.  Neurological: She is alert and oriented to person, place, and time.  AAOx3, answering questions appropriately; equal strength UE and LE bilaterally; CN grossly intact; moves all extremities appropriately without ataxia; no pronator drift; no dysmetria with finger to nose bilaterally; normal coordination; no focal neuro deficits or facial asymmetry appreciated  Skin: Skin is warm and dry. She is not diaphoretic.  Psychiatric: She has a normal mood and affect.    ED Course  Procedures (including critical care time) Labs Review Labs Reviewed  CBC WITH DIFFERENTIAL - Abnormal; Notable for the following:    RBC 5.36 (*)    MCV 75.2 (*)    MCH 23.7 (*)    Neutrophils Relative % 32 (*)    Neutro Abs 1.3 (*)    Lymphocytes Relative 49 (*)    Eosinophils Relative 8 (*)    All other components within normal limits  BASIC METABOLIC PANEL - Abnormal; Notable for the following:    Potassium 3.6 (*)    BUN 4 (*)    All other components within normal limits  URINALYSIS, ROUTINE W REFLEX MICROSCOPIC - Abnormal; Notable for the following:    APPearance CLOUDY (*)    Specific Gravity, Urine 1.002 (*)    Hgb urine dipstick TRACE (*)    Nitrite POSITIVE (*)    Leukocytes, UA LARGE (*)    All other components within normal limits  URINE MICROSCOPIC-ADD ON - Abnormal; Notable for the following:    Squamous Epithelial / LPF FEW (*)    Bacteria, UA MANY (*)    All other components within normal limits  I-STAT TROPOININ, ED    Imaging Review No results found.   EKG Interpretation None      MDM   Final diagnoses:  Syncope and collapse   58 year old female with 1 month of intermittent syncope. There appears to be a prodrome prior to her syncopal episodes.  On exam, patient is afebrile and overall nontoxic appearing. She has no signs of head trauma, neurologic exam is nonfocal.  Low suspicion for intracranial pathology.  Will  EKG, obtain basic labs, trop, u/a, CXR.  EKG sinus rhythm without ischemic change. Troponin is negative. Lab work is reassuring. Chest x-ray is clear. UA nitrite positive, culture pending.  Repeat neuro exam is unchanged.  Orthostatic VS are appropriate.  Sx have been ongoing for 1 month, UTI may be playing a role.  Patient treated with 1g Rocephin in ED, will d/c home on keflex.  Encouraged closed PCP FU.  Discussed plan with patient, he/she acknowledged understanding and agreed with plan of care.  Return precautions given for new or worsening symptoms.  Case discussed with attending physician, Dr. Colin Rhein, who personally evaluated patient and agrees with assessment and plan of care.  Larene Pickett, PA-C 12/30/13 1425

## 2013-12-31 NOTE — ED Provider Notes (Signed)
Medical screening examination/treatment/procedure(s) were conducted as a shared visit with non-physician practitioner(s) and myself.  I personally evaluated the patient during the encounter.   EKG Interpretation None      I performed an examination on the patient including cardiac, pulmonary, and gi systems which were unremarkable except as documented by PA.  She has a ho intermittent syncope.  Labs revealed UTI.  She was symptom free throughout her stay in the ED and dc uneventfully.    Debby Freiberg, MD 12/31/13 [phone]

## 2014-02-06 ENCOUNTER — Emergency Department (HOSPITAL_COMMUNITY): Payer: Medicaid Other

## 2014-02-06 ENCOUNTER — Emergency Department (HOSPITAL_COMMUNITY)
Admission: EM | Admit: 2014-02-06 | Discharge: 2014-02-06 | Disposition: A | Discharge status: Home / Self Care | Payer: Medicaid Other | Attending: Emergency Medicine | Admitting: Emergency Medicine

## 2014-02-06 ENCOUNTER — Encounter (HOSPITAL_COMMUNITY): Payer: Self-pay | Admitting: Emergency Medicine

## 2014-02-06 DIAGNOSIS — Z720 Tobacco use: Secondary | ICD-10-CM | POA: Insufficient documentation

## 2014-02-06 DIAGNOSIS — F319 Bipolar disorder, unspecified: Secondary | ICD-10-CM | POA: Insufficient documentation

## 2014-02-06 DIAGNOSIS — Z8739 Personal history of other diseases of the musculoskeletal system and connective tissue: Secondary | ICD-10-CM | POA: Insufficient documentation

## 2014-02-06 DIAGNOSIS — A64 Unspecified sexually transmitted disease: Secondary | ICD-10-CM | POA: Diagnosis not present

## 2014-02-06 DIAGNOSIS — R0789 Other chest pain: Secondary | ICD-10-CM

## 2014-02-06 DIAGNOSIS — F209 Schizophrenia, unspecified: Secondary | ICD-10-CM | POA: Insufficient documentation

## 2014-02-06 DIAGNOSIS — R079 Chest pain, unspecified: Secondary | ICD-10-CM | POA: Diagnosis present

## 2014-02-06 DIAGNOSIS — N898 Other specified noninflammatory disorders of vagina: Secondary | ICD-10-CM

## 2014-02-06 DIAGNOSIS — Z79899 Other long term (current) drug therapy: Secondary | ICD-10-CM | POA: Insufficient documentation

## 2014-02-06 DIAGNOSIS — Z880 Allergy status to penicillin: Secondary | ICD-10-CM | POA: Diagnosis not present

## 2014-02-06 DIAGNOSIS — A599 Trichomoniasis, unspecified: Secondary | ICD-10-CM | POA: Diagnosis not present

## 2014-02-06 DIAGNOSIS — N390 Urinary tract infection, site not specified: Secondary | ICD-10-CM | POA: Insufficient documentation

## 2014-02-06 DIAGNOSIS — J45909 Unspecified asthma, uncomplicated: Secondary | ICD-10-CM | POA: Diagnosis not present

## 2014-02-06 LAB — BASIC METABOLIC PANEL
Anion gap: 10 (ref 5–15)
BUN: 4 mg/dL — ABNORMAL LOW (ref 6–23)
CO2: 25 mEq/L (ref 19–32)
Calcium: 10.3 mg/dL (ref 8.4–10.5)
Chloride: 102 mEq/L (ref 96–112)
Creatinine, Ser: 0.68 mg/dL (ref 0.50–1.10)
GFR calc Af Amer: >90 mL/min (ref >90)
GFR calc non Af Amer: >90 mL/min (ref >90)
Glucose, Bld: 96 mg/dL (ref 70–99)
Potassium: 4.1 mEq/L (ref 3.7–5.3)
Sodium: 137 mEq/L (ref 137–147)

## 2014-02-06 LAB — I-STAT TROPONIN, ED
Troponin i, poc: 0 ng/mL (ref 0.00–0.08)
Troponin i, poc: 0 ng/mL (ref 0.00–0.08)

## 2014-02-06 LAB — PRO B NATRIURETIC PEPTIDE: Pro B Natriuretic peptide (BNP): 56.7 pg/mL (ref 0–125)

## 2014-02-06 LAB — URINALYSIS, ROUTINE W REFLEX MICROSCOPIC
Bilirubin Urine: NEGATIVE
Glucose, UA: NEGATIVE mg/dL
Hgb urine dipstick: NEGATIVE
Ketones, ur: NEGATIVE mg/dL
Nitrite: POSITIVE — AB
Protein, ur: NEGATIVE mg/dL
Specific Gravity, Urine: 1.006 (ref 1.005–1.030)
Urobilinogen, UA: 0.2 mg/dL (ref 0.0–1.0)
pH: 7 (ref 5.0–8.0)

## 2014-02-06 LAB — URINE MICROSCOPIC-ADD ON

## 2014-02-06 LAB — CBC
HCT: 40.7 % (ref 36.0–46.0)
Hemoglobin: 13.1 g/dL (ref 12.0–15.0)
MCH: 23.9 pg — ABNORMAL LOW (ref 26.0–34.0)
MCHC: 32.2 g/dL (ref 30.0–36.0)
MCV: 74.4 fL — ABNORMAL LOW (ref 78.0–100.0)
Platelets: 295 10*3/uL (ref 150–400)
RBC: 5.47 MIL/uL — ABNORMAL HIGH (ref 3.87–5.11)
RDW: 15.4 % (ref 11.5–15.5)
WBC: 6.1 10*3/uL (ref 4.0–10.5)

## 2014-02-06 LAB — WET PREP, GENITAL
Clue Cells Wet Prep HPF POC: NONE SEEN
Yeast Wet Prep HPF POC: NONE SEEN

## 2014-02-06 MED ORDER — LIDOCAINE HCL (PF) 1 % IJ SOLN
INTRAMUSCULAR | Status: AC
  Filled 2014-02-06: qty 5

## 2014-02-06 MED ORDER — CIPROFLOXACIN HCL 500 MG PO TABS: 500.0000 mg | ORAL_TABLET | Freq: Two times a day (BID) | ORAL | Status: DC

## 2014-02-06 MED ORDER — METRONIDAZOLE 500 MG PO TABS
2000.0000 mg | ORAL_TABLET | Freq: Once | ORAL | Status: AC
  Administered 2014-02-06: 2000 mg via ORAL
  Filled 2014-02-06: qty 4

## 2014-02-06 MED ORDER — CEFTRIAXONE SODIUM 250 MG IJ SOLR
250.0000 mg | Freq: Once | INTRAMUSCULAR | Status: AC
  Administered 2014-02-06: 250 mg via INTRAMUSCULAR
  Filled 2014-02-06: qty 250

## 2014-02-06 MED ORDER — AZITHROMYCIN 250 MG PO TABS
1000.0000 mg | ORAL_TABLET | Freq: Once | ORAL | Status: AC
  Administered 2014-02-06: 1000 mg via ORAL
  Filled 2014-02-06: qty 4

## 2014-02-06 NOTE — Discharge Instructions (Signed)
If you were given medicines take as directed.  If you are on coumadin or contraceptives realize their levels and effectiveness is altered by many different medicines.  If you have any reaction (rash, tongues swelling, other) to the medicines stop taking and see a physician.   Safe sex practices Please follow up as directed and return to the ER or see a physician for new or worsening symptoms.  Thank you. Filed Vitals:   02/06/14 0825 02/06/14 1111 02/06/14 1126  BP: 111/65 132/78 132/78  Pulse: 79    Temp: 97.7 F (36.5 C)    TempSrc: Oral    Resp: 22 21 14   Height: 5\' 5"  (1.651 m)    Weight: 120 lb (54.432 kg)    SpO2: 100% 100% 100%    Chest Pain (Nonspecific) It is often hard to give a specific diagnosis for the cause of chest pain. There is always a chance that your pain could be related to something serious, such as a heart attack or a blood clot in the lungs. You need to follow up with your health care provider for further evaluation. CAUSES   Heartburn.  Pneumonia or bronchitis.  Anxiety or stress.  Inflammation around your heart (pericarditis) or lung (pleuritis or pleurisy).  A blood clot in the lung.  A collapsed lung (pneumothorax). It can develop suddenly on its own (spontaneous pneumothorax) or from trauma to the chest.  Shingles infection (herpes zoster virus). The chest wall is composed of bones, muscles, and cartilage. Any of these can be the source of the pain.  The bones can be bruised by injury.  The muscles or cartilage can be strained by coughing or overwork.  The cartilage can be affected by inflammation and become sore (costochondritis). DIAGNOSIS  Lab tests or other studies may be needed to find the cause of your pain. Your health care provider may have you take a test called an ambulatory electrocardiogram (ECG). An ECG records your heartbeat patterns over a 24-hour period. You may also have other tests, such as:  Transthoracic echocardiogram (TTE).  During echocardiography, sound waves are used to evaluate how blood flows through your heart.  Transesophageal echocardiogram (TEE).  Cardiac monitoring. This allows your health care provider to monitor your heart rate and rhythm in real time.  Holter monitor. This is a portable device that records your heartbeat and can help diagnose heart arrhythmias. It allows your health care provider to track your heart activity for several days, if needed.  Stress tests by exercise or by giving medicine that makes the heart beat faster. TREATMENT   Treatment depends on what may be causing your chest pain. Treatment may include:  Acid blockers for heartburn.  Anti-inflammatory medicine.  Pain medicine for inflammatory conditions.  Antibiotics if an infection is present.  You may be advised to change lifestyle habits. This includes stopping smoking and avoiding alcohol, caffeine, and chocolate.  You may be advised to keep your head raised (elevated) when sleeping. This reduces the chance of acid going backward from your stomach into your esophagus. Most of the time, nonspecific chest pain will improve within 2-3 days with rest and mild pain medicine.  HOME CARE INSTRUCTIONS   If antibiotics were prescribed, take them as directed. Finish them even if you start to feel better.  For the next few days, avoid physical activities that bring on chest pain. Continue physical activities as directed.  Do not use any tobacco products, including cigarettes, chewing tobacco, or electronic cigarettes.  Avoid  drinking alcohol.  Only take medicine as directed by your health care provider.  Follow your health care provider's suggestions for further testing if your chest pain does not go away.  Keep any follow-up appointments you made. If you do not go to an appointment, you could develop lasting (chronic) problems with pain. If there is any problem keeping an appointment, call to reschedule. SEEK MEDICAL  CARE IF:   Your chest pain does not go away, even after treatment.  You have a rash with blisters on your chest.  You have a fever. SEEK IMMEDIATE MEDICAL CARE IF:   You have increased chest pain or pain that spreads to your arm, neck, jaw, back, or abdomen.  You have shortness of breath.  You have an increasing cough, or you cough up blood.  You have severe back or abdominal pain.  You feel nauseous or vomit.  You have severe weakness.  You faint.  You have chills. This is an emergency. Do not wait to see if the pain will go away. Get medical help at once. Call your local emergency services (911 in [place] Do not drive yourself to the hospital. MAKE SURE YOU:   Understand these instructions.  Will watch your condition.  Will get help right away if you are not doing well or get worse. Document Released: 01/22/2005 Document Revised: 04/19/2013 Document Reviewed: 11/18/2007 [facility] Patient Information 2015 [place], [place]. This information is not intended to replace advice given to you by your health care provider. Make sure you discuss any questions you have with your health care provider.
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
## 2014-02-06 NOTE — ED Notes (Addendum)
Pt c/o chest pain and yeast infection onset Thursday. Pt reports last use of crack cocaine was Friday

## 2014-02-07 LAB — GC/CHLAMYDIA PROBE AMP
CT Probe RNA: NEGATIVE
GC Probe RNA: NEGATIVE

## 2014-03-13 ENCOUNTER — Emergency Department (HOSPITAL_COMMUNITY)
Admission: EM | Admit: 2014-03-13 | Discharge: 2014-03-13 | Disposition: A | Discharge status: Home / Self Care | Payer: Medicaid Other | Attending: Emergency Medicine | Admitting: Emergency Medicine

## 2014-03-13 ENCOUNTER — Encounter (HOSPITAL_COMMUNITY): Payer: Self-pay | Admitting: Emergency Medicine

## 2014-03-13 DIAGNOSIS — Z8739 Personal history of other diseases of the musculoskeletal system and connective tissue: Secondary | ICD-10-CM | POA: Diagnosis not present

## 2014-03-13 DIAGNOSIS — F209 Schizophrenia, unspecified: Secondary | ICD-10-CM | POA: Diagnosis not present

## 2014-03-13 DIAGNOSIS — J45909 Unspecified asthma, uncomplicated: Secondary | ICD-10-CM | POA: Diagnosis not present

## 2014-03-13 DIAGNOSIS — Z720 Tobacco use: Secondary | ICD-10-CM | POA: Insufficient documentation

## 2014-03-13 DIAGNOSIS — N390 Urinary tract infection, site not specified: Secondary | ICD-10-CM

## 2014-03-13 DIAGNOSIS — Z880 Allergy status to penicillin: Secondary | ICD-10-CM | POA: Insufficient documentation

## 2014-03-13 DIAGNOSIS — Z7982 Long term (current) use of aspirin: Secondary | ICD-10-CM | POA: Diagnosis not present

## 2014-03-13 DIAGNOSIS — Z79899 Other long term (current) drug therapy: Secondary | ICD-10-CM | POA: Diagnosis not present

## 2014-03-13 DIAGNOSIS — F319 Bipolar disorder, unspecified: Secondary | ICD-10-CM | POA: Diagnosis not present

## 2014-03-13 DIAGNOSIS — R102 Pelvic and perineal pain: Secondary | ICD-10-CM | POA: Diagnosis present

## 2014-03-13 LAB — WET PREP, GENITAL
Clue Cells Wet Prep HPF POC: NONE SEEN
Trich, Wet Prep: NONE SEEN

## 2014-03-13 LAB — URINALYSIS, ROUTINE W REFLEX MICROSCOPIC
Bilirubin Urine: NEGATIVE
Glucose, UA: NEGATIVE mg/dL
Ketones, ur: NEGATIVE mg/dL
Nitrite: NEGATIVE
Protein, ur: NEGATIVE mg/dL
Specific Gravity, Urine: 1.003 — ABNORMAL LOW (ref 1.005–1.030)
Urobilinogen, UA: 0.2 mg/dL (ref 0.0–1.0)
pH: 7 (ref 5.0–8.0)

## 2014-03-13 LAB — URINE MICROSCOPIC-ADD ON

## 2014-03-13 MED ORDER — SULFAMETHOXAZOLE-TRIMETHOPRIM 800-160 MG PO TABS: 1.0000 | ORAL_TABLET | Freq: Two times a day (BID) | ORAL | Status: DC

## 2014-03-13 NOTE — ED Notes (Signed)
Pt from home with c/o vaginal burning x 4 days and "might be" having discharge.  Denies urinary symptoms.  Pt ambulatory to room, NAD, A&O.

## 2014-03-13 NOTE — ED Provider Notes (Signed)
CSN: [id number]     Arrival date & time 03/13/14  7939 History   First MD Initiated Contact with Patient 03/13/14 0800     Chief Complaint  Patient presents with  . Vaginal Pain     (Consider location/radiation/quality/duration/timing/severity/associated sxs/prior Treatment) Patient is a 58 y.o. female presenting with abdominal pain. The history is provided by the patient. No language interpreter was used.  Abdominal Pain Pain location:  Generalized Pain quality: aching   Pain radiates to:  Does not radiate Pain severity:  No pain Timing:  Constant Progression:  Worsening Chronicity:  New Relieved by:  Nothing Worsened by:  Nothing tried Ineffective treatments:  None tried Associated symptoms: dysuria and vaginal discharge   Associated symptoms: no vomiting   Risk factors: not pregnant     Past Medical History  Diagnosis Date  . Fibromyalgia   . Schizophrenia   . Asthma   . Bipolar 1 disorder   . Tobacco abuse    Past Surgical History  Procedure Laterality Date  . Cesarean section    . Tubal ligation     History reviewed. No pertinent family history. History  Substance Use Topics  . Smoking status: Current Every Day Smoker  . Smokeless tobacco: Not on file  . Alcohol Use: Yes   OB History    No data available     Review of Systems  Gastrointestinal: Positive for abdominal pain. Negative for vomiting.  Genitourinary: Positive for dysuria and vaginal discharge.  All other systems reviewed and are negative.     Allergies  Penicillins  Home Medications   Prior to Admission medications   Medication Sig Start Date End Date Taking? Authorizing Provider  albuterol (PROVENTIL HFA;VENTOLIN HFA) 108 (90 BASE) MCG/ACT inhaler Inhale 2 puffs into the lungs every 4 (four) hours as needed for shortness of breath.   Yes Historical Provider, MD  aspirin EC 325 MG tablet Take 325 mg by mouth daily as needed for mild pain.   Yes Historical Provider, MD   beclomethasone (QVAR) 40 MCG/ACT inhaler Inhale 2 puffs into the lungs 2 (two) times daily.   Yes Historical Provider, MD  benztropine (COGENTIN) 0.5 MG tablet Take 0.5 mg by mouth 2 (two) times daily.   Yes Historical Provider, MD  haloperidol (HALDOL) 1 MG tablet Take 1 mg by mouth at bedtime as needed for agitation.   Yes Historical Provider, MD  lithium carbonate (ESKALITH) 450 MG CR tablet Take 450 mg by mouth 2 (two) times daily.   Yes Historical Provider, MD  paliperidone (INVEGA) 6 MG 24 hr tablet Take 6 mg by mouth 2 (two) times daily.   Yes Historical Provider, MD   BP 122/75 mmHg  Pulse 73  Temp(Src) 98.4 F (36.9 C) (Oral)  Resp 25  SpO2 99% Physical Exam  Constitutional: She appears well-developed and well-nourished.  HENT:  Head: Normocephalic.  Right Ear: External ear normal.  Left Ear: External ear normal.  Nose: Nose normal.  Mouth/Throat: Oropharynx is clear and moist.  Eyes: Conjunctivae and EOM are normal. Pupils are equal, round, and reactive to light.  Neck: Normal range of motion. Neck supple.  Cardiovascular: Normal rate and normal heart sounds.   Pulmonary/Chest: Effort normal and breath sounds normal.  Abdominal: Soft.  Genitourinary: Vagina normal. No vaginal discharge found.  Adnexa nontender  Musculoskeletal: Normal range of motion.  Neurological: She is alert. She has normal reflexes.  Skin: Skin is warm.  Psychiatric: She has a normal mood and affect.  Nursing  note and vitals reviewed.   ED Course  Procedures (including critical care time) Labs Review Labs Reviewed - No data to display  Imaging Review No results found.   EKG Interpretation None      MDM   Final diagnoses:  UTI (lower urinary tract infection)    Bactrim ds ![place], PA-C 03/13/14 [place], PA-C 03/13/14 [place], MD 03/17/14 1600

## 2014-03-13 NOTE — Discharge Instructions (Signed)

## 2014-03-14 LAB — GC/CHLAMYDIA PROBE AMP
CT Probe RNA: NEGATIVE
GC Probe RNA: NEGATIVE

## 2014-04-24 ENCOUNTER — Encounter (HOSPITAL_COMMUNITY): Payer: Self-pay | Admitting: Emergency Medicine

## 2014-04-24 ENCOUNTER — Emergency Department (HOSPITAL_COMMUNITY): Payer: Medicaid Other

## 2014-04-24 ENCOUNTER — Emergency Department (HOSPITAL_COMMUNITY)
Admission: EM | Admit: 2014-04-24 | Discharge: 2014-04-25 | Disposition: A | Discharge status: Home / Self Care | Payer: Medicaid Other | Attending: Emergency Medicine | Admitting: Emergency Medicine

## 2014-04-24 DIAGNOSIS — Z23 Encounter for immunization: Secondary | ICD-10-CM | POA: Insufficient documentation

## 2014-04-24 DIAGNOSIS — S3992XA Unspecified injury of lower back, initial encounter: Secondary | ICD-10-CM | POA: Diagnosis not present

## 2014-04-24 DIAGNOSIS — J45909 Unspecified asthma, uncomplicated: Secondary | ICD-10-CM | POA: Insufficient documentation

## 2014-04-24 DIAGNOSIS — Z7982 Long term (current) use of aspirin: Secondary | ICD-10-CM | POA: Insufficient documentation

## 2014-04-24 DIAGNOSIS — Z7951 Long term (current) use of inhaled steroids: Secondary | ICD-10-CM | POA: Diagnosis not present

## 2014-04-24 DIAGNOSIS — Y9289 Other specified places as the place of occurrence of the external cause: Secondary | ICD-10-CM | POA: Insufficient documentation

## 2014-04-24 DIAGNOSIS — Z8739 Personal history of other diseases of the musculoskeletal system and connective tissue: Secondary | ICD-10-CM | POA: Diagnosis not present

## 2014-04-24 DIAGNOSIS — Z79899 Other long term (current) drug therapy: Secondary | ICD-10-CM | POA: Insufficient documentation

## 2014-04-24 DIAGNOSIS — S3991XA Unspecified injury of abdomen, initial encounter: Secondary | ICD-10-CM | POA: Diagnosis not present

## 2014-04-24 DIAGNOSIS — Z3202 Encounter for pregnancy test, result negative: Secondary | ICD-10-CM | POA: Diagnosis not present

## 2014-04-24 DIAGNOSIS — S4991XA Unspecified injury of right shoulder and upper arm, initial encounter: Secondary | ICD-10-CM | POA: Diagnosis not present

## 2014-04-24 DIAGNOSIS — R102 Pelvic and perineal pain: Secondary | ICD-10-CM

## 2014-04-24 DIAGNOSIS — F209 Schizophrenia, unspecified: Secondary | ICD-10-CM | POA: Insufficient documentation

## 2014-04-24 DIAGNOSIS — Z720 Tobacco use: Secondary | ICD-10-CM | POA: Diagnosis not present

## 2014-04-24 DIAGNOSIS — Y998 Other external cause status: Secondary | ICD-10-CM | POA: Diagnosis not present

## 2014-04-24 DIAGNOSIS — Z880 Allergy status to penicillin: Secondary | ICD-10-CM | POA: Insufficient documentation

## 2014-04-24 DIAGNOSIS — S0990XA Unspecified injury of head, initial encounter: Secondary | ICD-10-CM | POA: Diagnosis not present

## 2014-04-24 DIAGNOSIS — Y9389 Activity, other specified: Secondary | ICD-10-CM | POA: Diagnosis not present

## 2014-04-24 DIAGNOSIS — N390 Urinary tract infection, site not specified: Secondary | ICD-10-CM

## 2014-04-24 DIAGNOSIS — S3993XA Unspecified injury of pelvis, initial encounter: Secondary | ICD-10-CM | POA: Diagnosis not present

## 2014-04-24 DIAGNOSIS — F1410 Cocaine abuse, uncomplicated: Secondary | ICD-10-CM | POA: Diagnosis not present

## 2014-04-24 LAB — CBC WITH DIFFERENTIAL/PLATELET
Basophils Absolute: 0 10*3/uL (ref 0.0–0.1)
Basophils Relative: 0 % (ref 0–1)
Eosinophils Absolute: 0.7 10*3/uL (ref 0.0–0.7)
Eosinophils Relative: 13 % — ABNORMAL HIGH (ref 0–5)
HCT: 37.3 % (ref 36.0–46.0)
Hemoglobin: 11.7 g/dL — ABNORMAL LOW (ref 12.0–15.0)
Lymphocytes Relative: 63 % — ABNORMAL HIGH (ref 12–46)
Lymphs Abs: 3.1 10*3/uL (ref 0.7–4.0)
MCH: 23.8 pg — ABNORMAL LOW (ref 26.0–34.0)
MCHC: 31.4 g/dL (ref 30.0–36.0)
MCV: 75.8 fL — ABNORMAL LOW (ref 78.0–100.0)
Monocytes Absolute: 0.4 10*3/uL (ref 0.1–1.0)
Monocytes Relative: 7 % (ref 3–12)
Neutro Abs: 0.9 10*3/uL — ABNORMAL LOW (ref 1.7–7.7)
Neutrophils Relative %: 17 % — ABNORMAL LOW (ref 43–77)
Platelets: 251 10*3/uL (ref 150–400)
RBC: 4.92 MIL/uL (ref 3.87–5.11)
RDW: 15 % (ref 11.5–15.5)
WBC: 5.1 10*3/uL (ref 4.0–10.5)

## 2014-04-24 LAB — COMPREHENSIVE METABOLIC PANEL
ALT: 35 U/L (ref 0–35)
AST: 35 U/L (ref 0–37)
Albumin: 3.6 g/dL (ref 3.5–5.2)
Alkaline Phosphatase: 56 U/L (ref 39–117)
Anion gap: 7 (ref 5–15)
BUN: 5 mg/dL — ABNORMAL LOW (ref 6–23)
CO2: 25 mmol/L (ref 19–32)
Calcium: 9.2 mg/dL (ref 8.4–10.5)
Chloride: 107 mEq/L (ref 96–112)
Creatinine, Ser: 0.79 mg/dL (ref 0.50–1.10)
GFR calc Af Amer: >90 mL/min (ref >90)
GFR calc non Af Amer: >90 mL/min (ref >90)
Glucose, Bld: 90 mg/dL (ref 70–99)
Potassium: 3.9 mmol/L (ref 3.5–5.1)
Sodium: 139 mmol/L (ref 135–145)
Total Bilirubin: 0.5 mg/dL (ref 0.3–1.2)
Total Protein: 7.5 g/dL (ref 6.0–8.3)

## 2014-04-24 LAB — URINALYSIS, ROUTINE W REFLEX MICROSCOPIC
Bilirubin Urine: NEGATIVE
Glucose, UA: NEGATIVE mg/dL
Hgb urine dipstick: NEGATIVE
Ketones, ur: NEGATIVE mg/dL
Nitrite: POSITIVE — AB
Protein, ur: NEGATIVE mg/dL
Specific Gravity, Urine: 1.007 (ref 1.005–1.030)
Urobilinogen, UA: 1 mg/dL (ref 0.0–1.0)
pH: 7 (ref 5.0–8.0)

## 2014-04-24 LAB — URINE MICROSCOPIC-ADD ON

## 2014-04-24 LAB — RAPID URINE DRUG SCREEN, HOSP PERFORMED
Amphetamines: NOT DETECTED
Barbiturates: NOT DETECTED
Benzodiazepines: NOT DETECTED
Cocaine: POSITIVE — AB
Opiates: NOT DETECTED
Tetrahydrocannabinol: NOT DETECTED

## 2014-04-24 LAB — POC URINE PREG, ED: Preg Test, Ur: NEGATIVE

## 2014-04-24 LAB — LIPASE, BLOOD: Lipase: 31 U/L (ref 11–59)
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
## 2014-04-24 NOTE — ED Notes (Signed)
Patient transported to CT 
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
## 2014-04-25 ENCOUNTER — Emergency Department (HOSPITAL_COMMUNITY): Payer: Medicaid Other

## 2014-04-25 LAB — WET PREP, GENITAL
Trich, Wet Prep: NONE SEEN
WBC, Wet Prep HPF POC: NONE SEEN
Yeast Wet Prep HPF POC: NONE SEEN

## 2014-04-25 LAB — TROPONIN I
Troponin I: <0.03 ng/mL (ref <0.031)
Troponin I: <0.03 ng/mL (ref <0.031)

## 2014-04-25 LAB — PATHOLOGIST SMEAR REVIEW: Path Review: REACTIVE

## 2014-04-25 LAB — HIV ANTIBODY (ROUTINE TESTING W REFLEX): HIV 1&2 Ab, 4th Generation: NONREACTIVE

## 2014-04-25 LAB — GC/CHLAMYDIA PROBE AMP
CT Probe RNA: NEGATIVE
GC Probe RNA: NEGATIVE

## 2014-04-25 MED ORDER — AZITHROMYCIN 250 MG PO TABS
1000.0000 mg | ORAL_TABLET | Freq: Once | ORAL | Status: AC
Start: 2014-04-25 — End: 2014-04-25
  Administered 2014-04-25: 1000 mg via ORAL
  Filled 2014-04-25: qty 4

## 2014-04-25 MED ORDER — NITROFURANTOIN MONOHYD MACRO 100 MG PO CAPS: 100.0000 mg | ORAL_CAPSULE | Freq: Two times a day (BID) | ORAL | Status: DC

## 2014-04-25 MED ORDER — METRONIDAZOLE 500 MG PO TABS
2000.0000 mg | ORAL_TABLET | Freq: Once | ORAL | Status: AC
  Administered 2014-04-25: 2000 mg via ORAL
  Filled 2014-04-25: qty 4

## 2014-04-25 MED ORDER — TETANUS-DIPHTH-ACELL PERTUSSIS 5-2.5-18.5 LF-MCG/0.5 IM SUSP
0.5000 mL | Freq: Once | INTRAMUSCULAR | Status: AC
  Administered 2014-04-25: 0.5 mL via INTRAMUSCULAR
  Filled 2014-04-25: qty 0.5

## 2014-04-25 NOTE — ED Provider Notes (Signed)
1:35 AM: Received hand-off report from [place], PA-C.  Plan includes awaiting [place] results and if normal, pt may be d/c'd with prescription for UTI and follow-up with PCP.    2:45 AM: Pt resting without distress, awaiting [place] results.   4:00 AM: [place] resulted, right ovary normal, but left ovary not visualized.  Abdomen re-evaluated pt reports mild suprapubic pain but no tenderness to palpation, specifically no left LLQ tenderness. Pt is well-appearing, in no acute distress and vital signs are stable.  They appear safe to be discharged.  Discharge include follow-up with their PCP.  Return precautions provided.   Filed Vitals:   04/25/14 0330 04/25/14 0345 04/25/14 0347 04/25/14 0400  BP: 131/85 123/83 123/83 115/75  Pulse: 66 62 62 65  Temp:   97.7 F (36.5 C)   TempSrc:   Oral   Resp:   19 15  SpO2: 100% 100% 100% 100%      Britt Bottom, NP 04/25/14 [place], MD 04/26/14 [phone]

## 2014-04-25 NOTE — Discharge Instructions (Signed)
Please call and set-up an appointment with Health and [place] Please call and set-up an appointment with OBGYN Please call and follow-up with Health Department Please take antibiotics as prescribed for urinary tract infection  Please avoid any sexual activity  Please have partner(s) tested within the past 6 months to be tested as well  Please continue to monitor symptoms closely and if symptoms are to worsen or change (fever greater than 101, chills, sweating, nausea, vomiting, chest pain, shortness of breathe, difficulty breathing, weakness, numbness, tingling, worsening or changes to pain pattern, vaginal discharge, abnormal vaginal bleeding, back pain, cramping, inability to keep any food or fluids down) please report back to the Emergency Department immediately.   Urinary Tract Infection Urinary tract infections (UTIs) can develop anywhere along your urinary tract. Your urinary tract is your body's drainage system for removing wastes and extra water. Your urinary tract includes two kidneys, two ureters, a bladder, and a urethra. Your kidneys are a pair of bean-shaped organs. Each kidney is about the size of your fist. They are located below your ribs, one on each side of your spine. CAUSES Infections are caused by microbes, which are microscopic organisms, including fungi, viruses, and bacteria. These organisms are so small that they can only be seen through a microscope. Bacteria are the microbes that most commonly cause UTIs. SYMPTOMS  Symptoms of UTIs may vary by age and gender of the patient and by the location of the infection. Symptoms in young women typically include a frequent and intense urge to urinate and a painful, burning feeling in the bladder or urethra during urination. Older women and men are more likely to be tired, shaky, and weak and have muscle aches and abdominal pain. A fever may mean the infection is in your kidneys. Other symptoms of a kidney infection include pain  in your back or sides below the ribs, nausea, and vomiting. DIAGNOSIS To diagnose a UTI, your caregiver will ask you about your symptoms. Your caregiver also will ask to provide a urine sample. The urine sample will be tested for bacteria and white blood cells. White blood cells are made by your body to help fight infection. TREATMENT  Typically, UTIs can be treated with medication. Because most UTIs are caused by a bacterial infection, they usually can be treated with the use of antibiotics. The choice of antibiotic and length of treatment depend on your symptoms and the type of bacteria causing your infection. HOME CARE INSTRUCTIONS  If you were prescribed antibiotics, take them exactly as your caregiver instructs you. Finish the medication even if you feel better after you have only taken some of the medication.  Drink enough water and fluids to keep your urine clear or pale yellow.  Avoid caffeine, tea, and carbonated beverages. They tend to irritate your bladder.  Empty your bladder often. Avoid holding urine for long periods of time.  Empty your bladder before and after sexual intercourse.  After a bowel movement, women should cleanse from front to back. Use each tissue only once. SEEK MEDICAL CARE IF:   You have back pain.  You develop a fever.  Your symptoms do not begin to resolve within 3 days. SEEK IMMEDIATE MEDICAL CARE IF:   You have severe back pain or lower abdominal pain.  You develop chills.  You have nausea or vomiting.  You have continued burning or discomfort with urination. MAKE SURE YOU:   Understand these instructions.  Will watch your condition.  Will get help right  away if you are not doing well or get worse. Document Released: 01/22/2005 Document Revised: 10/14/2011 Document Reviewed: 05/23/2011 [facility] Patient Information 2015 [place], [place]. This information is not intended to replace advice given to you by your health care provider. Make sure  you discuss any questions you have with your health care provider.   Emergency Department Resource Guide 1) Find a Doctor and Pay Out of Pocket Although you won't have to find out who is covered by your insurance plan, it is a good idea to ask around and get recommendations. You will then need to call the office and see if the doctor you have chosen will accept you as a new patient and what types of options they offer for patients who are self-pay. Some doctors offer discounts or will set up payment plans for their patients who do not have insurance, but you will need to ask so you aren't surprised when you get to your appointment.  2) Contact Your Local Health Department Not all health departments have doctors that can see patients for sick visits, but many do, so it is worth a call to see if yours does. If you don't know where your local health department is, you can check in your phone book. The CDC also has a tool to help you locate your state's health department, and many state websites also have listings of all of their local health departments.  3) Find a [facility] If your illness is not likely to be very severe or complicated, you may want to try a walk in clinic. These are popping up all over the country in pharmacies, drugstores, and shopping centers. They're usually staffed by nurse practitioners or physician assistants that have been trained to treat common illnesses and complaints. They're usually fairly quick and inexpensive. However, if you have serious medical issues or chronic medical problems, these are probably not your best option.  No Primary Care Doctor: - Call Health Connect at  [phone] - they can help you locate a primary care doctor that  accepts your insurance, provides certain services, etc. - Physician Referral Service- [phone]  Chronic Pain Problems: Organization         Address  Phone   Notes  [facility]  [phone] Patients need  to be referred by their primary care doctor.   Medication Assistance: Organization         Address  Phone   Notes  [facility] Medication [facility] [place], [place] [address] [phone] --Must be a resident of [facility] -- Must have NO insurance coverage whatsoever (no Medicaid/ Medicare, etc.) -- The pt. MUST have a primary care doctor that directs their care regularly and follows them in the community   MedAssist  [phone]   [employer]  [phone]    Agencies that provide inexpensive medical care: Organization         Address  Phone   Notes  [place]  [phone]   [facility]    [phone]   [facility] [place], [place] [address] [phone]   [facility] [address], [place] [phone]   Planned Parenthood    [phone]   [facility]    [phone]   [place] and [place], [place] Phone:  [phone], Fax:  [phone]  Hours of Operation:  9 am - 6 pm, M-F.  Also accepts Medicaid/Medicare and self-pay.  [facility] for [address] [place], [address], [place] Phone: [phone], Fax: [phone]. Hours of Operation:  8:30 am - 5:30 pm, M-F.  Also accepts Medicaid and self-pay.  [facility] [address], [place] Phone: [phone]   [place], [place], [place] [phone], Ext. [phone] Mondays & Thursdays: 7-9 AM.  First 15 patients are seen on a first come, first serve basis.    [place] Providers:  Organization         Address  Phone   Notes  [facility] [address], [place] [phone] Also accepts self-pay patients.  [facility] [address], [place]  [phone]   [place], [address], [place] [phone]   [facility] [address], [place] [phone]   Lucianne Lei [address], [place]   [phone] Only accepts [place] Access [place] patients after they have their name applied to their card.   Self-Pay (no insurance) in [facility]:  Organization         Address  Phone   Notes  Sickle Cell Patients, [facility] [place] [phone]   [facility] Care [place] [phone]   [facility] [place]  [place], [place],  [phone]   [facility]/Dr. Osei-Bonsu  [address] [place] or [address], [place] [phone] Phone number for both [place] and [place] locations is the same.  [facility] [address] [place] [phone]   [facility] [address] [place] or [address] [phone]   [facility] [address], [place] [phone], phone; [phone], fax Sees patients 1st and 3rd Saturday of every month.  Must not qualify for public or private insurance (i.e. Medicaid, Medicare, [place] Health Choice, Veterans' Benefits)  Household income should be no more than 200% of the poverty level The clinic cannot treat you if you are pregnant or think you are pregnant  Sexually transmitted diseases are not treated at the clinic.    Dental Care: Organization         Address  Phone  Notes  [facility] Department of [facility] [phone] Accepts children up to age 47 who are enrolled in [place] or [place]; pregnant women with a Medicaid card; and children who have applied for Medicaid or [place] Health Choice, but were declined, whose  parents can pay a reduced fee at time of service.  [facility] Department of [facility]  [address], [place] [phone] Accepts children up to age 66 who are enrolled in [place] or [place]; pregnant women with a Medicaid card; and children who have applied for Medicaid or [place] Health Choice, but were declined, whose parents can pay a reduced fee at time of service.  [place] [facility] Access PROGRAM  [place] [phone] Patients are seen by appointment only. Walk-ins are not accepted. [place] will  see patients 69 years of age and older. Monday - Tuesday (8am-5pm) Most Wednesdays (8:30-5pm) $30 per visit, cash only  [facility] Adult Dental Access PROGRAM  [address], [facility] [phone] Patients are seen by appointment only. Walk-ins are not accepted. [place] will see patients 8 years of age and older. One Wednesday Evening (Monthly: Volunteer Based).  $30 per visit, cash only  [place]  [phone] for adults; Children under age 12, call Graduate Pediatric Dentistry at [phone]. Children aged 72-14, please call [phone] to request a pediatric application.  Dental services are provided in all areas of dental care including fillings, crowns and bridges, complete and partial dentures, implants, gum treatment, root canals, and extractions. Preventive care is also provided. Treatment is provided to both adults and children. Patients are selected via a lottery and there is often a waiting list.   [facility] [address] [place]  [phone] [email or web address]   [facility] [address] [place], [place] [phone], Ext. [phone] Second and Fourth Thursday of each month, opens at 6:30 AM; Clinic ends at 9 AM.  Patients are seen on a first-come first-served basis, and a limited number are seen during each clinic.   [facility]   [address] Hillard Danker [place], [place] [phone]   Eligibility Requirements You must have lived in [place], [place], or [place] counties for at least the last three months.   You cannot be eligible for state or federal sponsored [employer], including [employer], [place], or [employer].   You generally cannot be eligible for healthcare insurance through your employer.    How to apply: Eligibility screenings are held every Tuesday and Wednesday afternoon from 1:00 pm until 4:00 pm. You do not need an appointment for the interview!  [facility] [address] [place], [place]   [place]  [place] Department  [place] Ann  [phone]    Behavioral Health Resources in the Community: Intensive Outpatient Programs Organization         Address  Phone  Notes  [place]. [address] [place], [place] [phone]   [facility] Outpatient [address], [place], [place]   ADS: [facility] [address] [place], [place]   [place] [address],  [place], [place] or [phone]   Substance Abuse Resources Organization         Address  Phone  Notes  Alcohol and Drug Services  [phone]   [place]  [phone]   [facility] [place]   Chinita Pester  [phone]   Residential & Outpatient Substance Abuse Program  [phone]   Psychological Services Organization         Address  Phone  Notes  [facility]  [place]  [phone]   [place] [address], [place] or [phone]    Mobile Crisis Teams Organization         Address  Phone  Notes  [facility], Mobile Crisis Care Unit  [phone]     [facility]ive Psychotherapeutic Services  [address] [place], [place]   [facility] [address] [place] [phone]    Self-Help/Support Groups Organization  Address  Phone             Notes  [place]. of [place] - variety of support groups  [place] Call for more information  Narcotics Anonymous (NA), [facility] [address], [employer] [place]  2 meetings at this location   Special educational needs teacher         Address  Phone  Notes  [facility] [place],    [place]  [phone]   [facility]  [address], [place] [phone], [place], [place]   [place], [place] [phone] Admissions: 8am-3pm M-F  Incentives Substance [place] [address]    [place], [place] [phone]   [facility] [address] [place], [place], [place]   [facility] [address]  [place], [place]   [facility] - Intensive Outpatient [address], [place], [place]   [facility] ([place].,  [place], [place] [phone] or [phone]   [facility] (RTS) [address] [place], [place] Accepts Medicaid  [facility] [address],  [place] [phone] Substance Abuse/Addiction Treatment   [facility] Organization         Address  Phone  Notes  [facility]  [phone]   Domenic Schwab, PhD [address] Arlis Port[place], [place]   [phone] or [phone]   [place] [facility] [place], [place] [phone]   [facility] [address] [place], [place] [phone] Insurance/Medicaid/sponsorship through [facility] and Families [address] [place]                                     [place], [place] [phone] [place] [address], [place] [phone]    Dr. Adele Schilder  [phone]   [facility] [place] Dept. 1) [address] [place] 2) [place] 3)  [place] [address], [place] [phone]  [phone]   [place] [phone] or [phone] (After Hours)

## 2014-04-26 LAB — URINE CULTURE
Colony Count: >=100000
Special Requests: NORMAL

## 2014-04-27 ENCOUNTER — Telehealth (HOSPITAL_BASED_OUTPATIENT_CLINIC_OR_DEPARTMENT_OTHER): Payer: Self-pay | Admitting: *Deleted

## 2014-04-28 ENCOUNTER — Telehealth (HOSPITAL_COMMUNITY): Payer: Self-pay | Admitting: *Deleted

## 2014-04-29 ENCOUNTER — Telehealth (HOSPITAL_BASED_OUTPATIENT_CLINIC_OR_DEPARTMENT_OTHER): Payer: Self-pay | Admitting: Emergency Medicine

## 2014-05-01 ENCOUNTER — Telehealth (HOSPITAL_BASED_OUTPATIENT_CLINIC_OR_DEPARTMENT_OTHER): Payer: Self-pay | Admitting: *Deleted

## 2014-05-09 ENCOUNTER — Emergency Department (HOSPITAL_COMMUNITY): Payer: Medicaid Other

## 2014-05-09 ENCOUNTER — Encounter (HOSPITAL_COMMUNITY): Payer: Self-pay | Admitting: *Deleted

## 2014-05-09 ENCOUNTER — Emergency Department (HOSPITAL_COMMUNITY)
Admission: EM | Admit: 2014-05-09 | Discharge: 2014-05-09 | Disposition: A | Discharge status: Home / Self Care | Payer: Medicaid Other | Attending: Emergency Medicine | Admitting: Emergency Medicine

## 2014-05-09 DIAGNOSIS — F209 Schizophrenia, unspecified: Secondary | ICD-10-CM | POA: Diagnosis not present

## 2014-05-09 DIAGNOSIS — J45909 Unspecified asthma, uncomplicated: Secondary | ICD-10-CM | POA: Diagnosis not present

## 2014-05-09 DIAGNOSIS — Z7951 Long term (current) use of inhaled steroids: Secondary | ICD-10-CM | POA: Insufficient documentation

## 2014-05-09 DIAGNOSIS — F319 Bipolar disorder, unspecified: Secondary | ICD-10-CM | POA: Insufficient documentation

## 2014-05-09 DIAGNOSIS — Z7982 Long term (current) use of aspirin: Secondary | ICD-10-CM | POA: Insufficient documentation

## 2014-05-09 DIAGNOSIS — R079 Chest pain, unspecified: Secondary | ICD-10-CM | POA: Diagnosis not present

## 2014-05-09 DIAGNOSIS — Z720 Tobacco use: Secondary | ICD-10-CM | POA: Insufficient documentation

## 2014-05-09 DIAGNOSIS — M797 Fibromyalgia: Secondary | ICD-10-CM | POA: Diagnosis not present

## 2014-05-09 DIAGNOSIS — Z79899 Other long term (current) drug therapy: Secondary | ICD-10-CM | POA: Insufficient documentation

## 2014-05-09 LAB — BASIC METABOLIC PANEL
Anion gap: 4 — ABNORMAL LOW (ref 5–15)
BUN: <5 mg/dL — ABNORMAL LOW (ref 6–23)
CO2: 28 mmol/L (ref 19–32)
Calcium: 9.6 mg/dL (ref 8.4–10.5)
Chloride: 104 mEq/L (ref 96–112)
Creatinine, Ser: 0.72 mg/dL (ref 0.50–1.10)
GFR calc Af Amer: >90 mL/min (ref >90)
GFR calc non Af Amer: >90 mL/min (ref >90)
Glucose, Bld: 91 mg/dL (ref 70–99)
Potassium: 3.7 mmol/L (ref 3.5–5.1)
Sodium: 136 mmol/L (ref 135–145)

## 2014-05-09 LAB — CBC
HCT: 41.5 % (ref 36.0–46.0)
Hemoglobin: 13 g/dL (ref 12.0–15.0)
MCH: 23.6 pg — ABNORMAL LOW (ref 26.0–34.0)
MCHC: 31.3 g/dL (ref 30.0–36.0)
MCV: 75.2 fL — ABNORMAL LOW (ref 78.0–100.0)
Platelets: 254 10*3/uL (ref 150–400)
RBC: 5.52 MIL/uL — ABNORMAL HIGH (ref 3.87–5.11)
RDW: 14.7 % (ref 11.5–15.5)
WBC: 4.7 10*3/uL (ref 4.0–10.5)

## 2014-05-09 LAB — I-STAT TROPONIN, ED: Troponin i, poc: 0 ng/mL (ref 0.00–0.08)

## 2014-05-09 NOTE — ED Notes (Signed)
PA at bedside.

## 2014-05-09 NOTE — ED Provider Notes (Signed)
CSN: [id number]     Arrival date & time 05/09/14  1246 History   First MD Initiated Contact with Patient 05/09/14 1525     Chief Complaint  Patient presents with  . Chest Pain     (Consider location/radiation/quality/duration/timing/severity/associated sxs/prior Treatment) HPI Comments: Patient with history of cocaine abuse, presents with complaint of chest pain which woke her from sleep at approximately 1 AM. Patient describes pain as "sharp and dull" in the middle of her chest lasting approximately 6 hours until 7 AM. Non-radiating. Pain then resolved and has not returned. It was not associated with shortness of breath, diaphoresis, nausea, vomiting, palpitations. She has not had pain like this in her past. No recent exertional chest pain. No fever, URI symptoms, change in baseline cough. Patient denies risk factors for pulmonary embolism including: unilateral leg swelling, history of DVT/PE/other blood clots, use of estrogens, recent immobilizations, recent surgery, recent travel (>4hr segment), malignancy, hemoptysis. Last cocaine use may have been last night. No known history of hypertension, hypercholesterolemia, diabetes, significant FH of CAD. Patient is a smoker.     Patient is a 59 y.o. female presenting with chest pain. The history is provided by the patient and medical records.  Chest Pain Associated symptoms: no abdominal pain, no back pain, no cough, no diaphoresis, no fever, no nausea, no palpitations, no shortness of breath and not vomiting     Past Medical History  Diagnosis Date  . Fibromyalgia   . Schizophrenia   . Asthma   . Bipolar 1 disorder   . Tobacco abuse    Past Surgical History  Procedure Laterality Date  . Cesarean section    . Tubal ligation     No family history on file. History  Substance Use Topics  . Smoking status: Current Every Day Smoker  . Smokeless tobacco: Not on file  . Alcohol Use: Yes   OB History    No data available     Review of  Systems  Constitutional: Negative for fever and diaphoresis.  Eyes: Negative for redness.  Respiratory: Negative for cough and shortness of breath.   Cardiovascular: Positive for chest pain. Negative for palpitations and leg swelling.  Gastrointestinal: Negative for nausea, vomiting and abdominal pain.  Genitourinary: Negative for dysuria.  Musculoskeletal: Negative for back pain and neck pain.  Skin: Negative for rash.  Neurological: Negative for syncope and light-headedness.      Allergies  Penicillins  Home Medications   Prior to Admission medications   Medication Sig Start Date End Date Taking? Authorizing Provider  albuterol (PROVENTIL HFA;VENTOLIN HFA) 108 (90 BASE) MCG/ACT inhaler Inhale 2 puffs into the lungs every 4 (four) hours as needed for shortness of breath.   Yes Historical Provider, MD  aspirin EC 325 MG tablet Take 325 mg by mouth daily as needed for mild pain.   Yes Historical Provider, MD  beclomethasone (QVAR) 40 MCG/ACT inhaler Inhale 2 puffs into the lungs 2 (two) times daily.   Yes Historical Provider, MD  benztropine (COGENTIN) 0.5 MG tablet Take 0.5 mg by mouth 2 (two) times daily.   Yes Historical Provider, MD  cephALEXin (KEFLEX) 500 MG capsule Take 500 mg by mouth 2 (two) times daily. Started 1/6 for 7 days 05/03/14  Yes Historical Provider, MD  haloperidol (HALDOL) 1 MG tablet Take 1 mg by mouth at bedtime as needed for agitation.   Yes Historical Provider, MD  lithium carbonate (ESKALITH) 450 MG CR tablet Take 450 mg by mouth 2 (two) times  daily.   Yes Historical Provider, MD  paliperidone (INVEGA) 6 MG 24 hr tablet Take 6 mg by mouth 2 (two) times daily.   Yes Historical Provider, MD  nitrofurantoin, macrocrystal-monohydrate, (MACROBID) 100 MG capsule Take 1 capsule (100 mg total) by mouth 2 (two) times daily. Patient not taking: Reported on 05/09/2014 04/25/14   Marissa Sciacca, PA-C  sulfamethoxazole-trimethoprim (SEPTRA DS) 800-160 MG per tablet Take 1  tablet by mouth 2 (two) times daily. Patient not taking: Reported on 04/24/2014 03/13/14   Fransico Meadow, PA-C   BP 124/76 mmHg  Pulse 75  Temp(Src) 98 F (36.7 C) (Oral)  Resp 23  Ht 5\' 5"  (1.651 m)  Wt 120 lb (54.432 kg)  BMI 19.97 kg/m2  SpO2 100%   Physical Exam  Constitutional: She appears well-developed and well-nourished.  HENT:  Head: Normocephalic and atraumatic.  Mouth/Throat: Mucous membranes are normal. Mucous membranes are not dry.  Eyes: Conjunctivae are normal.  Neck: Trachea normal and normal range of motion. Neck supple. Normal carotid pulses and no JVD present. No muscular tenderness present. Carotid bruit is not present. No tracheal deviation present.  Cardiovascular: Normal rate, regular rhythm, S1 normal, S2 normal, normal heart sounds and intact distal pulses.  Exam reveals no decreased pulses.   No murmur heard. Pulmonary/Chest: Effort normal. No respiratory distress. She has no wheezes. She exhibits no tenderness.  Abdominal: Soft. Normal aorta and bowel sounds are normal. There is no tenderness. There is no rebound and no guarding.  Musculoskeletal: Normal range of motion.  Neurological: She is alert.  Skin: Skin is warm and dry. She is not diaphoretic. No cyanosis. No pallor.  Psychiatric: She has a normal mood and affect.  Nursing note and vitals reviewed.   ED Course  Procedures (including critical care time) Labs Review Labs Reviewed  CBC - Abnormal; Notable for the following:    RBC 5.52 (*)    MCV 75.2 (*)    MCH 23.6 (*)    All other components within normal limits  BASIC METABOLIC PANEL - Abnormal; Notable for the following:    BUN <5 (*)    Anion gap 4 (*)    All other components within normal limits  I-STAT TROPOININ, ED    Imaging Review Dg Chest 2 View  05/09/2014   CLINICAL DATA:  Cough, weakness beginning earlier today  EXAM: CHEST  2 VIEW  COMPARISON:  04/24/2014  FINDINGS: Heart and mediastinal contours are within normal  limits. Minimal bibasilar opacities, likely atelectasis. No effusions. No acute bony abnormality.  IMPRESSION: Bibasilar atelectasis.   Electronically Signed   By: Rolm Baptise M.D.   On: 05/09/2014 14:37     EKG Interpretation   Date/Time:  Tuesday May 09 2014 12:53:51 EST Ventricular Rate:  77 PR Interval:  146 QRS Duration: 76 QT Interval:  394 QTC Calculation: 445 R Axis:   -25 Text Interpretation:  Normal sinus rhythm Normal ECG No significant change  since last tracing Confirmed by DOCHERTY  MD, MEGAN ([id number]) on 05/09/2014  4:26:51 PM       4:03 PM Patient seen and examined. Work-up initiated. Medications ordered. Troponin negative >6 hrs after resolution of pain.   Vital signs reviewed and are as follows: BP 124/76 mmHg  Pulse 75  Temp(Src) 98 F (36.7 C) (Oral)  Resp 23  Ht 5\' 5"  (1.651 m)  Wt 120 lb (54.432 kg)  BMI 19.97 kg/m2  SpO2 100%   Patient discussed with Dr. Tawnya Crook. Pt informed of  results. She is dressed and anxious to go home. PCP referral given.   Patient was counseled to return with severe chest pain, especially if the pain is crushing or pressure-like and spreads to the arms, back, neck, or jaw, or if they have sweating, nausea, or shortness of breath with the pain. They were encouraged to call 911 with these symptoms.   They were also told to return if their chest pain gets worse and does not go away with rest, they have an attack of chest pain lasting longer than usual despite rest and treatment with the medications their caregiver has prescribed, if they wake from sleep with chest pain or shortness of breath, if they feel dizzy or faint, if they have chest pain not typical of their usual pain, or if they have any other emergent concerns regarding their health.  The patient verbalized understanding and agreed.    MDM   Final diagnoses:  Chest pain, unspecified chest pain type   Patient with chest tightness. Feel patient is low risk for ACS  given history (poor story for ACS/MI), negative troponin x1 6hrs after resolution of pain, normal/unchanged EKG. No exertional symptoms. Pt is low-risk for PE and story would be atypical for PE --doubt PE and with no current CP, SOB, or abnormal vitals would not order d-dimer at this time. Cocaine use yesterday but no elevated troponin.   No dangerous or life-threatening conditions suspected or identified by history, physical exam, and by work-up. No indications for hospitalization identified.       Carlisle Cater, PA-C 05/09/14 6384  Ernestina Patches, MD 05/10/14 1158

## 2014-05-09 NOTE — ED Notes (Addendum)
Patient states she woke up at 0100 with chest pain.  Patient has not taken any meds for chest pain.  Patient denies hx of MI.  Patient is alert.  Skin warm and dry.  Patient denies sob.  Denies any nausea.  Denies dizziness.  She admits to using cocaine 2 days ago

## 2014-05-09 NOTE — Discharge Instructions (Signed)
Please read and follow all provided instructions.  Your diagnoses today include:  1. Chest pain, unspecified chest pain type   2. Chest pain     Tests performed today include:  An EKG of your heart  A chest x-ray  Cardiac enzymes - a blood test for heart muscle damage  Blood counts and electrolytes  Vital signs. See below for your results today.   Medications prescribed:   None  Take any prescribed medications only as directed.  Follow-up instructions: Please follow-up with your primary care provider as soon as you can for further evaluation of your symptoms.   Return instructions:  SEEK IMMEDIATE MEDICAL ATTENTION IF:  You have severe chest pain, especially if the pain is crushing or pressure-like and spreads to the arms, back, neck, or jaw, or if you have sweating, nausea (feeling sick to your stomach), or shortness of breath. THIS IS AN EMERGENCY. Don't wait to see if the pain will go away. Get medical help at once. Call 911 or 0 (operator). DO NOT drive yourself to the hospital.   Your chest pain gets worse and does not go away with rest.   You have an attack of chest pain lasting longer than usual, despite rest and treatment with the medications your caregiver has prescribed.   You wake from sleep with chest pain or shortness of breath.  You feel dizzy or faint.  You have chest pain not typical of your usual pain for which you originally saw your caregiver.   You have any other emergent concerns regarding your health.  Additional Information: Chest pain comes from many different causes. Your caregiver has diagnosed you as having chest pain that is not specific for one problem, but does not require admission.  You are at low risk for an acute heart condition or other serious illness.   Your vital signs today were: BP 126/64 mmHg   Pulse 71   Temp(Src) 98 F (36.7 C) (Oral)   Resp 13   Ht 5\' 5"  (1.651 m)   Wt 120 lb (54.432 kg)   BMI 19.97 kg/m2   SpO2 100% If  your blood pressure (BP) was elevated above 135/85 this visit, please have this repeated by your doctor within one month. --------------

## 2014-06-15 ENCOUNTER — Encounter (HOSPITAL_COMMUNITY): Payer: Self-pay | Admitting: *Deleted

## 2014-06-15 ENCOUNTER — Emergency Department (HOSPITAL_COMMUNITY)
Admission: EM | Admit: 2014-06-15 | Discharge: 2014-06-15 | Disposition: A | Discharge status: Home / Self Care | Payer: Medicaid Other | Attending: Emergency Medicine | Admitting: Emergency Medicine

## 2014-06-15 ENCOUNTER — Emergency Department (HOSPITAL_COMMUNITY): Payer: Medicaid Other

## 2014-06-15 DIAGNOSIS — J45909 Unspecified asthma, uncomplicated: Secondary | ICD-10-CM | POA: Diagnosis not present

## 2014-06-15 DIAGNOSIS — Z7951 Long term (current) use of inhaled steroids: Secondary | ICD-10-CM | POA: Diagnosis not present

## 2014-06-15 DIAGNOSIS — F209 Schizophrenia, unspecified: Secondary | ICD-10-CM | POA: Diagnosis not present

## 2014-06-15 DIAGNOSIS — N3000 Acute cystitis without hematuria: Secondary | ICD-10-CM | POA: Diagnosis not present

## 2014-06-15 DIAGNOSIS — F319 Bipolar disorder, unspecified: Secondary | ICD-10-CM | POA: Diagnosis not present

## 2014-06-15 DIAGNOSIS — Z792 Long term (current) use of antibiotics: Secondary | ICD-10-CM | POA: Diagnosis not present

## 2014-06-15 DIAGNOSIS — Z880 Allergy status to penicillin: Secondary | ICD-10-CM | POA: Diagnosis not present

## 2014-06-15 DIAGNOSIS — Z7982 Long term (current) use of aspirin: Secondary | ICD-10-CM | POA: Insufficient documentation

## 2014-06-15 DIAGNOSIS — R079 Chest pain, unspecified: Secondary | ICD-10-CM | POA: Insufficient documentation

## 2014-06-15 DIAGNOSIS — Z79899 Other long term (current) drug therapy: Secondary | ICD-10-CM | POA: Diagnosis not present

## 2014-06-15 DIAGNOSIS — Z720 Tobacco use: Secondary | ICD-10-CM | POA: Diagnosis not present

## 2014-06-15 DIAGNOSIS — R102 Pelvic and perineal pain: Secondary | ICD-10-CM | POA: Diagnosis present

## 2014-06-15 LAB — URINALYSIS, ROUTINE W REFLEX MICROSCOPIC
Bilirubin Urine: NEGATIVE
Glucose, UA: NEGATIVE mg/dL
Ketones, ur: NEGATIVE mg/dL
Nitrite: POSITIVE — AB
Protein, ur: NEGATIVE mg/dL
Specific Gravity, Urine: 1.007 (ref 1.005–1.030)
Urobilinogen, UA: 0.2 mg/dL (ref 0.0–1.0)
pH: 7 (ref 5.0–8.0)

## 2014-06-15 LAB — URINE MICROSCOPIC-ADD ON

## 2014-06-15 LAB — CBC
HCT: 39.7 % (ref 36.0–46.0)
Hemoglobin: 12.2 g/dL (ref 12.0–15.0)
MCH: 23 pg — ABNORMAL LOW (ref 26.0–34.0)
MCHC: 30.7 g/dL (ref 30.0–36.0)
MCV: 74.9 fL — ABNORMAL LOW (ref 78.0–100.0)
Platelets: 289 10*3/uL (ref 150–400)
RBC: 5.3 MIL/uL — ABNORMAL HIGH (ref 3.87–5.11)
RDW: 15.3 % (ref 11.5–15.5)
WBC: 6.3 10*3/uL (ref 4.0–10.5)

## 2014-06-15 LAB — BASIC METABOLIC PANEL
Anion gap: 5 (ref 5–15)
BUN: 8 mg/dL (ref 6–23)
CO2: 25 mmol/L (ref 19–32)
Calcium: 9.8 mg/dL (ref 8.4–10.5)
Chloride: 104 mmol/L (ref 96–112)
Creatinine, Ser: 0.65 mg/dL (ref 0.50–1.10)
GFR calc Af Amer: >90 mL/min (ref >90)
GFR calc non Af Amer: >90 mL/min (ref >90)
Glucose, Bld: 102 mg/dL — ABNORMAL HIGH (ref 70–99)
Potassium: 4.1 mmol/L (ref 3.5–5.1)
Sodium: 134 mmol/L — ABNORMAL LOW (ref 135–145)

## 2014-06-15 LAB — I-STAT TROPONIN, ED: Troponin i, poc: 0 ng/mL (ref 0.00–0.08)

## 2014-06-15 MED ORDER — CIPROFLOXACIN HCL 500 MG PO TABS: 500.0000 mg | ORAL_TABLET | Freq: Two times a day (BID) | ORAL | Status: DC

## 2014-06-15 NOTE — ED Notes (Signed)
Pt back from X-ray.  

## 2014-06-15 NOTE — ED Provider Notes (Signed)
CSN: [id number]     Arrival date & time 06/15/14  1147 History   First MD Initiated Contact with Patient 06/15/14 1316     Chief Complaint  Patient presents with  . Vaginal Pain  . Chest Pain     (Consider location/radiation/quality/duration/timing/severity/associated sxs/prior Treatment) Patient is a 59 y.o. female presenting with vaginal pain and chest pain. The history is provided by the patient.  Vaginal Pain This is a recurrent problem. Associated symptoms include chest pain. Pertinent negatives include no abdominal pain, no headaches and no shortness of breath.  Chest Pain Associated symptoms: no abdominal pain, no back pain, no headache, no nausea, no numbness, no shortness of breath, not vomiting and no weakness    she presents with vaginal pain and dysuria. States it started a few days ago. States some pain with urination and pain in the area without urination. States she has had some clear vaginal discharge. States she does not know the last time she had sex. No diarrhea. No fevers.  Patient states she's had episodes of left-sided chest pain. States it come and go. Is not exertion. It is dull. She states no cocaine use recently. She states she does not have a history of heart disease.  Past Medical History  Diagnosis Date  . Fibromyalgia   . Schizophrenia   . Asthma   . Bipolar 1 disorder   . Tobacco abuse    Past Surgical History  Procedure Laterality Date  . Cesarean section    . Tubal ligation     No family history on file. History  Substance Use Topics  . Smoking status: Current Every Day Smoker -- 0.50 packs/day    Types: Cigarettes  . Smokeless tobacco: Not on file  . Alcohol Use: Yes     Comment: occ   OB History    No data available     Review of Systems  Constitutional: Negative for activity change and appetite change.  Eyes: Negative for pain.  Respiratory: Negative for chest tightness and shortness of breath.   Cardiovascular: Positive for chest  pain. Negative for leg swelling.  Gastrointestinal: Negative for nausea, vomiting, abdominal pain and diarrhea.  Genitourinary: Positive for dysuria, vaginal discharge and vaginal pain. Negative for flank pain.  Musculoskeletal: Negative for back pain and neck stiffness.  Skin: Negative for rash.  Neurological: Negative for weakness, numbness and headaches.  Psychiatric/Behavioral: Negative for behavioral problems.      Allergies  Penicillins  Home Medications   Prior to Admission medications   Medication Sig Start Date End Date Taking? Authorizing Provider  albuterol (PROVENTIL HFA;VENTOLIN HFA) 108 (90 BASE) MCG/ACT inhaler Inhale 2 puffs into the lungs every 4 (four) hours as needed for shortness of breath.   Yes Historical Provider, MD  aspirin EC 325 MG tablet Take 325 mg by mouth daily as needed for mild pain.   Yes Historical Provider, MD  beclomethasone (QVAR) 40 MCG/ACT inhaler Inhale 2 puffs into the lungs 2 (two) times daily.   Yes Historical Provider, MD  benztropine (COGENTIN) 0.5 MG tablet Take 0.5 mg by mouth 2 (two) times daily.   Yes Historical Provider, MD  haloperidol (HALDOL) 1 MG tablet Take 1 mg by mouth at bedtime as needed for agitation.   Yes Historical Provider, MD  lithium carbonate (ESKALITH) 450 MG CR tablet Take 450 mg by mouth 2 (two) times daily.   Yes Historical Provider, MD  paliperidone (INVEGA) 6 MG 24 hr tablet Take 6 mg by mouth 2 (  two) times daily.   Yes Historical Provider, MD  cephALEXin (KEFLEX) 500 MG capsule Take 500 mg by mouth 2 (two) times daily. Started 1/6 for 7 days 05/03/14   Historical Provider, MD  ciprofloxacin (CIPRO) 500 MG tablet Take 1 tablet (500 mg total) by mouth 2 (two) times daily. 06/15/14   Jasper Riling. Sairah Knobloch, MD  nitrofurantoin, macrocrystal-monohydrate, (MACROBID) 100 MG capsule Take 1 capsule (100 mg total) by mouth 2 (two) times daily. Patient not taking: Reported on 05/09/2014 04/25/14   Marissa Sciacca, PA-C   sulfamethoxazole-trimethoprim (SEPTRA DS) 800-160 MG per tablet Take 1 tablet by mouth 2 (two) times daily. Patient not taking: Reported on 04/24/2014 03/13/14   Hollace Kinnier Sofia, PA-C   BP 144/81 mmHg  Pulse 70  Temp(Src) 97.9 F (36.6 C) (Oral)  Resp 16  Ht 5\' 5"  (1.651 m)  Wt 120 lb (54.432 kg)  BMI 19.97 kg/m2  SpO2 100% Physical Exam  Constitutional: She appears well-developed and well-nourished.  Cardiovascular: Normal rate.   Pulmonary/Chest: Effort normal.  Abdominal: Soft. There is no tenderness.  Musculoskeletal: Normal range of motion.  Neurological: She is alert.  Skin: Skin is warm.    ED Course  Procedures (including critical care time) Labs Review Labs Reviewed  CBC - Abnormal; Notable for the following:    RBC 5.30 (*)    MCV 74.9 (*)    MCH 23.0 (*)    All other components within normal limits  BASIC METABOLIC PANEL - Abnormal; Notable for the following:    Sodium 134 (*)    Glucose, Bld 102 (*)    All other components within normal limits  URINALYSIS, ROUTINE W REFLEX MICROSCOPIC - Abnormal; Notable for the following:    APPearance CLOUDY (*)    Hgb urine dipstick MODERATE (*)    Nitrite POSITIVE (*)    Leukocytes, UA LARGE (*)    All other components within normal limits  URINE MICROSCOPIC-ADD ON - Abnormal; Notable for the following:    Bacteria, UA MANY (*)    All other components within normal limits  URINE CULTURE  I-STAT TROPOININ, ED    Imaging Review Dg Chest 2 View  06/15/2014   CLINICAL DATA:  Chest pain and difficulty breathing for three days  EXAM: CHEST  2 VIEW  COMPARISON:  May 09, 2014  FINDINGS: Lungs are clear. Heart size and pulmonary vascularity are normal. No adenopathy. No pneumothorax. No bone lesions.  IMPRESSION: No edema or consolidation.   Electronically Signed   By: Lowella Grip III M.D.   On: 06/15/2014 14:48     EKG Interpretation   Date/Time:  Thursday June 15 2014 12:15:11 EST Ventricular Rate:   78 PR Interval:  150 QRS Duration: 76 QT Interval:  380 QTC Calculation: 433 R Axis:   -40 Text Interpretation:  Normal sinus rhythm Left axis deviation Abnormal ECG  No significant change since last tracing Confirmed by Alvino Chapel  MD,  Ovid Curd [phone]) on 06/15/2014 1:26:03 PM      MDM   Final diagnoses:  Acute cystitis without hematuria    Patient with dysuria. Has UTI. Has had them recently and has been on various antibiotics. Culture sent and started on Cipro. Also had chest pain likely noncardiac. EKG and lab work reassuring. Will discharge home.    Jasper Riling. Alvino Chapel, MD 06/15/14 [phone]

## 2014-06-15 NOTE — ED Notes (Signed)
Pt initially came to ED for vaginal burning and itching and clear vaginal discharge.  Once in triage pt stated that we might as well check out her heart while she's hear b/c her chest has been "pulling to the L" for the past few days and shortness of breath.

## 2014-06-15 NOTE — Discharge Instructions (Signed)

## 2014-06-17 LAB — URINE CULTURE: Colony Count: >=100000

## 2014-06-20 ENCOUNTER — Telehealth (HOSPITAL_COMMUNITY): Payer: Self-pay

## 2014-06-20 NOTE — Telephone Encounter (Signed)
Post ED Visit - Positive Culture Follow-up  Culture report reviewed by antimicrobial stewardship pharmacist: []  Wes Tupelo, Pharm.D., BCPS []  Heide Guile, Pharm.D., BCPS []  Alycia Rossetti, Pharm.D., BCPS []  Vici, Pharm.D., BCPS, AAHIVP [x]  Legrand Como, Pharm.D., BCPS, AAHIVP []  Isac Sarna, Pharm.D., BCPS  Positive Urine culture, >/= 100,000 colonies -> Klebsiella Pneumoniae Treated with Ciprofloxacin, organism sensitive to the same and no further patient follow-up is required at this time.  Dortha Kern 06/20/2014, 5:18 AM

## 2014-07-17 ENCOUNTER — Encounter (HOSPITAL_COMMUNITY): Payer: Self-pay | Admitting: Family Medicine

## 2014-07-17 ENCOUNTER — Emergency Department (HOSPITAL_COMMUNITY)
Admission: EM | Admit: 2014-07-17 | Discharge: 2014-07-17 | Disposition: A | Discharge status: Home / Self Care | Payer: Medicaid Other | Attending: Emergency Medicine | Admitting: Emergency Medicine

## 2014-07-17 DIAGNOSIS — N898 Other specified noninflammatory disorders of vagina: Secondary | ICD-10-CM | POA: Diagnosis present

## 2014-07-17 DIAGNOSIS — Z792 Long term (current) use of antibiotics: Secondary | ICD-10-CM | POA: Diagnosis not present

## 2014-07-17 DIAGNOSIS — R0789 Other chest pain: Secondary | ICD-10-CM | POA: Diagnosis not present

## 2014-07-17 DIAGNOSIS — Z7951 Long term (current) use of inhaled steroids: Secondary | ICD-10-CM | POA: Insufficient documentation

## 2014-07-17 DIAGNOSIS — I2510 Atherosclerotic heart disease of native coronary artery without angina pectoris: Secondary | ICD-10-CM | POA: Insufficient documentation

## 2014-07-17 DIAGNOSIS — R200 Anesthesia of skin: Secondary | ICD-10-CM | POA: Insufficient documentation

## 2014-07-17 DIAGNOSIS — Z79899 Other long term (current) drug therapy: Secondary | ICD-10-CM | POA: Insufficient documentation

## 2014-07-17 DIAGNOSIS — F319 Bipolar disorder, unspecified: Secondary | ICD-10-CM | POA: Insufficient documentation

## 2014-07-17 DIAGNOSIS — N3000 Acute cystitis without hematuria: Secondary | ICD-10-CM | POA: Diagnosis not present

## 2014-07-17 DIAGNOSIS — Z720 Tobacco use: Secondary | ICD-10-CM | POA: Insufficient documentation

## 2014-07-17 DIAGNOSIS — J45909 Unspecified asthma, uncomplicated: Secondary | ICD-10-CM | POA: Insufficient documentation

## 2014-07-17 DIAGNOSIS — Z880 Allergy status to penicillin: Secondary | ICD-10-CM | POA: Diagnosis not present

## 2014-07-17 DIAGNOSIS — F209 Schizophrenia, unspecified: Secondary | ICD-10-CM | POA: Diagnosis not present

## 2014-07-17 LAB — URINE MICROSCOPIC-ADD ON

## 2014-07-17 LAB — URINALYSIS, ROUTINE W REFLEX MICROSCOPIC
Bilirubin Urine: NEGATIVE
Glucose, UA: NEGATIVE mg/dL
Ketones, ur: NEGATIVE mg/dL
Nitrite: POSITIVE — AB
Protein, ur: NEGATIVE mg/dL
Specific Gravity, Urine: 1.006 (ref 1.005–1.030)
Urobilinogen, UA: 0.2 mg/dL (ref 0.0–1.0)
pH: 7.5 (ref 5.0–8.0)

## 2014-07-17 LAB — PREGNANCY, URINE: Preg Test, Ur: NEGATIVE

## 2014-07-17 LAB — WET PREP, GENITAL
Clue Cells Wet Prep HPF POC: NONE SEEN
Trich, Wet Prep: NONE SEEN
Yeast Wet Prep HPF POC: NONE SEEN

## 2014-07-17 MED ORDER — CIPROFLOXACIN HCL 500 MG PO TABS: 500.0000 mg | ORAL_TABLET | Freq: Two times a day (BID) | ORAL | Status: DC

## 2014-07-17 NOTE — ED Provider Notes (Signed)
CSN: [id number]     Arrival date & time 07/17/14  4098 History   First MD Initiated Contact with Patient 07/17/14 [phone]     Chief Complaint  Patient presents with  . Vaginal Itching     (Consider location/radiation/quality/duration/timing/severity/associated sxs/prior Treatment) HPI   The patient is a 59 y/o female who presents with vaginal burning and itching. Symptoms began 1 week ago. The burning is made worse with urination. She endorses dyspareunia x1 this morning. She states she had these symptoms "in the past month or so," was seen in this ED and given a shot and a prescription for an antibiotic for a UTI, which she completed. She denies fevers, chills, shortness of breath, nausea, vomiting, diarrhea, vaginal discharge or bleeding, hematuria, incontinence, abdominal pain. She endorses numbness of her inner thighs and chest tightness. She has noticed the chest tightness x1 week. It is located in the epigastric area and left chest. It is intermittent and mild. She does not have a personal history of CAD, but her brother recently died of an MI and this worries her.  She endorses tobacco use of 10 cigarettes/day, occasional alcohol, and occasional crack cocaine, most recent use this morning. She has had an STI in the past year for which she was treated, but does not recall which. She has had 2 sexual partners in the past 6 months and states she experiences dyspareunia only when also having urinary symptoms.  Past Medical History  Diagnosis Date  . Fibromyalgia   . Schizophrenia   . Asthma   . Bipolar 1 disorder   . Tobacco abuse    Past Surgical History  Procedure Laterality Date  . Cesarean section    . Tubal ligation     History reviewed. No pertinent family history. History  Substance Use Topics  . Smoking status: Current Every Day Smoker -- 0.50 packs/day    Types: Cigarettes  . Smokeless tobacco: Not on file  . Alcohol Use: Yes     Comment: occ   OB History    No data  available     Review of Systems  All other systems negative except as documented in the HPI. All pertinent positives and negatives as reviewed in the HPI.    Allergies  Penicillins  Home Medications   Prior to Admission medications   Medication Sig Start Date End Date Taking? Authorizing Provider  albuterol (PROVENTIL HFA;VENTOLIN HFA) 108 (90 BASE) MCG/ACT inhaler Inhale 2 puffs into the lungs every 4 (four) hours as needed for shortness of breath.    Historical Provider, MD  aspirin EC 325 MG tablet Take 325 mg by mouth daily as needed for mild pain.    Historical Provider, MD  beclomethasone (QVAR) 40 MCG/ACT inhaler Inhale 2 puffs into the lungs 2 (two) times daily.    Historical Provider, MD  benztropine (COGENTIN) 0.5 MG tablet Take 0.5 mg by mouth 2 (two) times daily.    Historical Provider, MD  cephALEXin (KEFLEX) 500 MG capsule Take 500 mg by mouth 2 (two) times daily. Started 1/6 for 7 days 05/03/14   Historical Provider, MD  ciprofloxacin (CIPRO) 500 MG tablet Take 1 tablet (500 mg total) by mouth 2 (two) times daily. 06/15/14   Davonna Belling, MD  haloperidol (HALDOL) 1 MG tablet Take 1 mg by mouth at bedtime as needed for agitation.    Historical Provider, MD  lithium carbonate (ESKALITH) 450 MG CR tablet Take 450 mg by mouth 2 (two) times daily.  Historical Provider, MD  nitrofurantoin, macrocrystal-monohydrate, (MACROBID) 100 MG capsule Take 1 capsule (100 mg total) by mouth 2 (two) times daily. Patient not taking: Reported on 05/09/2014 04/25/14   Marissa Sciacca, PA-C  paliperidone (INVEGA) 6 MG 24 hr tablet Take 6 mg by mouth 2 (two) times daily.    Historical Provider, MD  sulfamethoxazole-trimethoprim (SEPTRA DS) 800-160 MG per tablet Take 1 tablet by mouth 2 (two) times daily. Patient not taking: Reported on 04/24/2014 03/13/14   [place], PA-C   BP 101/68 mmHg  Pulse 58  Temp(Src) 98.6 F (37 C)  Resp 18  SpO2 100% Physical Exam  Constitutional: She is  oriented to person, place, and time. No distress.  Thin female appearing older than age  HENT:  Head: Normocephalic and atraumatic.  Edentulous  Eyes: EOM are normal. Pupils are equal, round, and reactive to light.  Neck: Normal range of motion. Neck supple. No tracheal deviation present. No thyromegaly present.  Cardiovascular: Normal rate, regular rhythm, normal heart sounds and intact distal pulses.  Exam reveals no gallop and no friction rub.   No murmur heard. Pulmonary/Chest: Effort normal and breath sounds normal. No respiratory distress.  Abdominal: Soft. Bowel sounds are normal. She exhibits no distension. There is no tenderness. There is no CVA tenderness.  Genitourinary: Uterus normal. There is no rash, tenderness or lesion on the right labia. There is no rash, tenderness or lesion on the left labia. Cervix exhibits discharge. Cervix exhibits no motion tenderness and no friability. Right adnexum displays no mass and no tenderness. Left adnexum displays no mass and no tenderness. No erythema or bleeding in the vagina.  Thin, white cervical discharge  Lymphadenopathy:    She has no cervical adenopathy.       Right: No inguinal adenopathy present.       Left: No inguinal adenopathy present.  Neurological: She is alert and oriented to person, place, and time. No cranial nerve deficit. She exhibits normal muscle tone. Coordination normal.  Skin: Skin is warm and dry.  Psychiatric: She has a normal mood and affect.  Nursing note and vitals reviewed.   ED Course  Procedures (including critical care time) Labs Review Labs Reviewed  URINALYSIS, ROUTINE W REFLEX MICROSCOPIC   patient will be treated for UTI, based on her urinalysis.  Told to return here as needed.  Patient agrees the plan and all questions were answered I advised the patient find a primary care Dr. for follow-up    Dalia Heading, PA-C 07/18/14 1654  Sherwood Gambler, MD 07/22/14 1640

## 2014-07-17 NOTE — Discharge Instructions (Signed)
Return here as needed.  Follow-up with your primary care doctor °

## 2014-07-17 NOTE — ED Notes (Signed)
Pt here for vaginal itching and burning. sts x 1 week.

## 2014-07-17 NOTE — ED Notes (Signed)
Pt is in stable condition upon d/c and ambulates from ED escorted by this RN.

## 2014-07-18 LAB — GC/CHLAMYDIA PROBE AMP (~~LOC~~) NOT AT ARMC
Chlamydia: NEGATIVE
Neisseria Gonorrhea: NEGATIVE

## 2014-07-30 ENCOUNTER — Encounter (HOSPITAL_COMMUNITY): Payer: Self-pay | Admitting: Emergency Medicine

## 2014-07-30 ENCOUNTER — Emergency Department (HOSPITAL_COMMUNITY)
Admission: EM | Admit: 2014-07-30 | Discharge: 2014-07-30 | Disposition: A | Discharge status: Home / Self Care | Payer: Medicaid Other | Attending: Emergency Medicine | Admitting: Emergency Medicine

## 2014-07-30 DIAGNOSIS — L293 Anogenital pruritus, unspecified: Secondary | ICD-10-CM | POA: Diagnosis present

## 2014-07-30 DIAGNOSIS — Z7982 Long term (current) use of aspirin: Secondary | ICD-10-CM | POA: Insufficient documentation

## 2014-07-30 DIAGNOSIS — F209 Schizophrenia, unspecified: Secondary | ICD-10-CM | POA: Diagnosis not present

## 2014-07-30 DIAGNOSIS — F319 Bipolar disorder, unspecified: Secondary | ICD-10-CM | POA: Diagnosis not present

## 2014-07-30 DIAGNOSIS — N760 Acute vaginitis: Secondary | ICD-10-CM | POA: Insufficient documentation

## 2014-07-30 DIAGNOSIS — Z880 Allergy status to penicillin: Secondary | ICD-10-CM | POA: Insufficient documentation

## 2014-07-30 DIAGNOSIS — Z8739 Personal history of other diseases of the musculoskeletal system and connective tissue: Secondary | ICD-10-CM | POA: Diagnosis not present

## 2014-07-30 DIAGNOSIS — Z79899 Other long term (current) drug therapy: Secondary | ICD-10-CM | POA: Diagnosis not present

## 2014-07-30 DIAGNOSIS — Z720 Tobacco use: Secondary | ICD-10-CM | POA: Diagnosis not present

## 2014-07-30 DIAGNOSIS — Z7951 Long term (current) use of inhaled steroids: Secondary | ICD-10-CM | POA: Insufficient documentation

## 2014-07-30 DIAGNOSIS — N390 Urinary tract infection, site not specified: Secondary | ICD-10-CM | POA: Diagnosis not present

## 2014-07-30 DIAGNOSIS — Z792 Long term (current) use of antibiotics: Secondary | ICD-10-CM | POA: Insufficient documentation

## 2014-07-30 DIAGNOSIS — B9689 Other specified bacterial agents as the cause of diseases classified elsewhere: Secondary | ICD-10-CM

## 2014-07-30 DIAGNOSIS — J45909 Unspecified asthma, uncomplicated: Secondary | ICD-10-CM | POA: Diagnosis not present

## 2014-07-30 LAB — URINALYSIS, ROUTINE W REFLEX MICROSCOPIC
Bilirubin Urine: NEGATIVE
Glucose, UA: NEGATIVE mg/dL
Ketones, ur: NEGATIVE mg/dL
Nitrite: POSITIVE — AB
Protein, ur: NEGATIVE mg/dL
Specific Gravity, Urine: 1.005 (ref 1.005–1.030)
Urobilinogen, UA: 1 mg/dL (ref 0.0–1.0)
pH: 7.5 (ref 5.0–8.0)

## 2014-07-30 LAB — URINE MICROSCOPIC-ADD ON

## 2014-07-30 LAB — WET PREP, GENITAL
Trich, Wet Prep: NONE SEEN
WBC, Wet Prep HPF POC: NONE SEEN
Yeast Wet Prep HPF POC: NONE SEEN

## 2014-07-30 MED ORDER — METRONIDAZOLE 500 MG PO TABS: 500.0000 mg | ORAL_TABLET | Freq: Two times a day (BID) | ORAL | Status: DC

## 2014-07-30 MED ORDER — CEPHALEXIN 500 MG PO CAPS: 500.0000 mg | ORAL_CAPSULE | Freq: Two times a day (BID) | ORAL | Status: DC

## 2014-07-30 NOTE — ED Notes (Signed)
Pt c/o vaginal itching, vaginal discharge and burning with urination. Pt also reports frequent urination.

## 2014-07-30 NOTE — Discharge Instructions (Signed)
Bacterial Vaginosis Bacterial vaginosis is an infection of the vagina. It happens when too many of certain germs (bacteria) grow in the vagina. HOME CARE  Take your medicine as told by your doctor.  Finish your medicine even if you start to feel better.  Do not have sex until you finish your medicine and are better.  Tell your sex partner that you have an infection. They should see their doctor for treatment.  Practice safe sex. Use condoms. Have only one sex partner. GET HELP IF:  You are not getting better after 3 days of treatment.  You have more grey fluid (discharge) coming from your vagina than before.  You have more pain than before.  You have a fever. MAKE SURE YOU:   Understand these instructions.  Will watch your condition.  Will get help right away if you are not doing well or get worse. Document Released: 01/22/2008 Document Revised: 02/02/2013 Document Reviewed: 11/24/2012 [facility] Patient Information 2015 [place], [place]. This information is not intended to replace advice given to you by your health care provider. Make sure you discuss any questions you have with your health care provider.  Urinary Tract Infection A urinary tract infection (UTI) can occur any place along the urinary tract. The tract includes the kidneys, ureters, bladder, and urethra. A type of germ called bacteria often causes a UTI. UTIs are often helped with antibiotic medicine.  HOME CARE   If given, take antibiotics as told by your doctor. Finish them even if you start to feel better.  Drink enough fluids to keep your pee (urine) clear or pale yellow.  Avoid tea, drinks with caffeine, and bubbly (carbonated) drinks.  Pee often. Avoid holding your pee in for a long time.  Pee before and after having sex (intercourse).  Wipe from front to back after you poop (bowel movement) if you are a woman. Use each tissue only once. GET HELP RIGHT AWAY IF:   You have back pain.  You have lower  belly (abdominal) pain.  You have chills.  You feel sick to your stomach (nauseous).  You throw up (vomit).  Your burning or discomfort with peeing does not go away.  You have a fever.  Your symptoms are not better in 3 days. MAKE SURE YOU:   Understand these instructions.  Will watch your condition.  Will get help right away if you are not doing well or get worse. Document Released: 10/01/2007 Document Revised: 01/07/2012 Document Reviewed: 11/13/2011 [facility] Patient Information 2015 [place], [place]. This information is not intended to replace advice given to you by your health care provider. Make sure you discuss any questions you have with your health care provider.

## 2014-07-30 NOTE — ED Provider Notes (Signed)
CSN: [id number]     Arrival date & time 07/30/14  1610 History   First MD Initiated Contact with Patient 07/30/14 1732     Chief Complaint  Patient presents with  . Vaginal Itching  . Dysuria     Patient is a 59 y.o. female presenting with vaginal itching and dysuria. The history is provided by the patient. No language interpreter was used.  Vaginal Itching  Dysuria  Karen Best presents for evaluation of dysuria and vaginal itching. She was recently treated for urinary tract infection had similar symptoms at that time. Her symptoms cleared up and she took antibiotics only returned shortly after stopping antibiotics. She exhibited present almost a week at this time. She denies any fevers, chest pain, abdominal pain, vomiting, diarrhea. She denies any new sexual contacts. She does describe a white vaginal discharge. Symptoms are moderate, constant, worsening.  Past Medical History  Diagnosis Date  . Fibromyalgia   . Schizophrenia   . Asthma   . Bipolar 1 disorder   . Tobacco abuse    Past Surgical History  Procedure Laterality Date  . Cesarean section    . Tubal ligation     No family history on file. History  Substance Use Topics  . Smoking status: Current Every Day Smoker -- 0.50 packs/day    Types: Cigarettes  . Smokeless tobacco: Not on file  . Alcohol Use: Yes     Comment: occ   OB History    No data available     Review of Systems  Genitourinary: Positive for dysuria.  All other systems reviewed and are negative.     Allergies  Penicillins  Home Medications   Prior to Admission medications   Medication Sig Start Date End Date Taking? Authorizing Provider  albuterol (PROVENTIL HFA;VENTOLIN HFA) 108 (90 BASE) MCG/ACT inhaler Inhale 2 puffs into the lungs every 4 (four) hours as needed for shortness of breath.    Historical Provider, MD  aspirin EC 325 MG tablet Take 325 mg by mouth daily as needed for mild pain.    Historical Provider, MD  beclomethasone  (QVAR) 40 MCG/ACT inhaler Inhale 2 puffs into the lungs 2 (two) times daily.    Historical Provider, MD  benztropine (COGENTIN) 0.5 MG tablet Take 0.5 mg by mouth 2 (two) times daily.    Historical Provider, MD  cephALEXin (KEFLEX) 500 MG capsule Take 500 mg by mouth 2 (two) times daily. Started 1/6 for 7 days 05/03/14   Historical Provider, MD  ciprofloxacin (CIPRO) 500 MG tablet Take 1 tablet (500 mg total) by mouth 2 (two) times daily. 07/17/14   Dalia Heading, PA-C  haloperidol (HALDOL) 1 MG tablet Take 1 mg by mouth at bedtime as needed for agitation.    Historical Provider, MD  lithium carbonate (ESKALITH) 450 MG CR tablet Take 450 mg by mouth 2 (two) times daily.    Historical Provider, MD  nitrofurantoin, macrocrystal-monohydrate, (MACROBID) 100 MG capsule Take 1 capsule (100 mg total) by mouth 2 (two) times daily. Patient not taking: Reported on 05/09/2014 04/25/14   Marissa Sciacca, PA-C  paliperidone (INVEGA) 6 MG 24 hr tablet Take 6 mg by mouth 2 (two) times daily.    Historical Provider, MD  sulfamethoxazole-trimethoprim (SEPTRA DS) 800-160 MG per tablet Take 1 tablet by mouth 2 (two) times daily. Patient not taking: Reported on 04/24/2014 03/13/14   Fransico Meadow, PA-C   BP 109/64 mmHg  Pulse 69  Temp(Src) 97.9 F (36.6 C) (Oral)  Resp  20  Ht 5\' 4"  (1.626 m)  Wt 119 lb (53.978 kg)  BMI 20.42 kg/m2  SpO2 100% Physical Exam  Constitutional: She is oriented to person, place, and time. She appears well-developed and well-nourished.  HENT:  Head: Normocephalic and atraumatic.  Cardiovascular: Normal rate and regular rhythm.   No murmur heard. Pulmonary/Chest: Effort normal and breath sounds normal. No respiratory distress.  Abdominal: Soft. There is no tenderness. There is no rebound and no guarding.  Genitourinary:  Scant white vaginal discharge, os closed, no CMT or adnexal tenderness  Musculoskeletal: She exhibits no edema or tenderness.  Neurological: She is alert and  oriented to person, place, and time.  Skin: Skin is warm and dry.  Psychiatric: She has a normal mood and affect. Her behavior is normal.  Nursing note and vitals reviewed.   ED Course  Procedures (including critical care time) Labs Review Labs Reviewed  WET PREP, GENITAL - Abnormal; Notable for the following:    Clue Cells Wet Prep HPF POC MODERATE (*)    All other components within normal limits  URINALYSIS, ROUTINE W REFLEX MICROSCOPIC - Abnormal; Notable for the following:    APPearance TURBID (*)    Hgb urine dipstick SMALL (*)    Nitrite POSITIVE (*)    Leukocytes, UA LARGE (*)    All other components within normal limits  URINE MICROSCOPIC-ADD ON - Abnormal; Notable for the following:    Squamous Epithelial / LPF MANY (*)    Bacteria, UA MANY (*)    All other components within normal limits  URINE CULTURE  GC/CHLAMYDIA PROBE AMP ([place])    Imaging Review No results found.   EKG Interpretation None      MDM   Final diagnoses:  UTI (lower urinary tract infection)  BV (bacterial vaginosis)    Patient here for evaluation of vaginal discharge and dysuria. UA is consistent with urinary tract infection. Pelvic exam is benign there is no evidence of cervicitis or be PID. Treating for BV given patient's symptoms as well as for urinary tract infection. Cultures sent given patient's recurrent infections. Providing prescription for Flagyl and Keflex.    Quintella Reichert, MD 07/30/14 Einar Crow

## 2014-07-31 LAB — GC/CHLAMYDIA PROBE AMP (~~LOC~~) NOT AT ARMC
Chlamydia: NEGATIVE
Neisseria Gonorrhea: NEGATIVE

## 2014-08-02 LAB — URINE CULTURE: Colony Count: >=100000

## 2014-08-03 ENCOUNTER — Telehealth (HOSPITAL_BASED_OUTPATIENT_CLINIC_OR_DEPARTMENT_OTHER): Payer: Self-pay | Admitting: Emergency Medicine

## 2014-08-03 NOTE — Telephone Encounter (Signed)
Post ED Visit - Positive Culture Follow-up  Culture report reviewed by antimicrobial stewardship pharmacist: []  Wes Scott, Pharm.D., BCPS [x]  Heide Guile, Pharm.D., BCPS []  Alycia Rossetti, Pharm.D., BCPS []  [place], Pharm.D., BCPS, AAHIVP []  Legrand Como, Pharm.D., BCPS, AAHIVP []  Isac Sarna, Pharm.D., BCPS  Positive urine culture E. coli Treated with cephalexin and metronidazole, organism sensitive to the same and no further patient follow-up is required at this time.  Hazle Nordmann 08/03/2014, 11:04 AM

## 2014-08-15 ENCOUNTER — Encounter (HOSPITAL_COMMUNITY): Payer: Self-pay | Admitting: Neurology

## 2014-08-15 ENCOUNTER — Emergency Department (HOSPITAL_COMMUNITY)
Admission: EM | Admit: 2014-08-15 | Discharge: 2014-08-15 | Disposition: A | Discharge status: Home / Self Care | Payer: Medicaid Other | Attending: Emergency Medicine | Admitting: Emergency Medicine

## 2014-08-15 DIAGNOSIS — R102 Pelvic and perineal pain: Secondary | ICD-10-CM | POA: Diagnosis present

## 2014-08-15 DIAGNOSIS — N390 Urinary tract infection, site not specified: Secondary | ICD-10-CM

## 2014-08-15 DIAGNOSIS — Z7951 Long term (current) use of inhaled steroids: Secondary | ICD-10-CM | POA: Insufficient documentation

## 2014-08-15 DIAGNOSIS — Z8739 Personal history of other diseases of the musculoskeletal system and connective tissue: Secondary | ICD-10-CM | POA: Diagnosis not present

## 2014-08-15 DIAGNOSIS — Z5900 Homelessness unspecified: Secondary | ICD-10-CM

## 2014-08-15 DIAGNOSIS — F209 Schizophrenia, unspecified: Secondary | ICD-10-CM | POA: Diagnosis not present

## 2014-08-15 DIAGNOSIS — J45909 Unspecified asthma, uncomplicated: Secondary | ICD-10-CM | POA: Insufficient documentation

## 2014-08-15 DIAGNOSIS — N760 Acute vaginitis: Secondary | ICD-10-CM | POA: Insufficient documentation

## 2014-08-15 DIAGNOSIS — Z720 Tobacco use: Secondary | ICD-10-CM | POA: Diagnosis not present

## 2014-08-15 DIAGNOSIS — Z79899 Other long term (current) drug therapy: Secondary | ICD-10-CM | POA: Diagnosis not present

## 2014-08-15 DIAGNOSIS — F319 Bipolar disorder, unspecified: Secondary | ICD-10-CM | POA: Insufficient documentation

## 2014-08-15 DIAGNOSIS — Z880 Allergy status to penicillin: Secondary | ICD-10-CM | POA: Diagnosis not present

## 2014-08-15 LAB — I-STAT BETA HCG BLOOD, ED (MC, WL, AP ONLY): I-stat hCG, quantitative: 7.4 m[IU]/mL — ABNORMAL HIGH (ref <5)

## 2014-08-15 LAB — URINALYSIS, ROUTINE W REFLEX MICROSCOPIC
Bilirubin Urine: NEGATIVE
Glucose, UA: NEGATIVE mg/dL
Ketones, ur: NEGATIVE mg/dL
Nitrite: POSITIVE — AB
Protein, ur: NEGATIVE mg/dL
Specific Gravity, Urine: 1.006 (ref 1.005–1.030)
Urobilinogen, UA: 0.2 mg/dL (ref 0.0–1.0)
pH: 6.5 (ref 5.0–8.0)

## 2014-08-15 LAB — BASIC METABOLIC PANEL
Anion gap: 7 (ref 5–15)
BUN: <5 mg/dL — ABNORMAL LOW (ref 6–23)
CO2: 23 mmol/L (ref 19–32)
Calcium: 9.6 mg/dL (ref 8.4–10.5)
Chloride: 106 mmol/L (ref 96–112)
Creatinine, Ser: 0.78 mg/dL (ref 0.50–1.10)
GFR calc Af Amer: >90 mL/min (ref >90)
GFR calc non Af Amer: >90 mL/min (ref >90)
Glucose, Bld: 64 mg/dL — ABNORMAL LOW (ref 70–99)
Potassium: 3.8 mmol/L (ref 3.5–5.1)
Sodium: 136 mmol/L (ref 135–145)

## 2014-08-15 LAB — CBC WITH DIFFERENTIAL/PLATELET
Basophils Absolute: 0 10*3/uL (ref 0.0–0.1)
Basophils Relative: 1 % (ref 0–1)
Eosinophils Absolute: 0.3 10*3/uL (ref 0.0–0.7)
Eosinophils Relative: 9 % — ABNORMAL HIGH (ref 0–5)
HCT: 41.5 % (ref 36.0–46.0)
Hemoglobin: 13.4 g/dL (ref 12.0–15.0)
Lymphocytes Relative: 56 % — ABNORMAL HIGH (ref 12–46)
Lymphs Abs: 2 10*3/uL (ref 0.7–4.0)
MCH: 23.9 pg — ABNORMAL LOW (ref 26.0–34.0)
MCHC: 32.3 g/dL (ref 30.0–36.0)
MCV: 74.1 fL — ABNORMAL LOW (ref 78.0–100.0)
Monocytes Absolute: 0.2 10*3/uL (ref 0.1–1.0)
Monocytes Relative: 6 % (ref 3–12)
Neutro Abs: 1 10*3/uL — ABNORMAL LOW (ref 1.7–7.7)
Neutrophils Relative %: 28 % — ABNORMAL LOW (ref 43–77)
Platelets: 275 10*3/uL (ref 150–400)
RBC: 5.6 MIL/uL — ABNORMAL HIGH (ref 3.87–5.11)
RDW: 15.5 % (ref 11.5–15.5)
WBC: 3.6 10*3/uL — ABNORMAL LOW (ref 4.0–10.5)

## 2014-08-15 LAB — URINE MICROSCOPIC-ADD ON

## 2014-08-15 LAB — WET PREP, GENITAL
Trich, Wet Prep: NONE SEEN
Yeast Wet Prep HPF POC: NONE SEEN

## 2014-08-15 MED ORDER — LEVOFLOXACIN IN D5W 500 MG/100ML IV SOLN
500.0000 mg | Freq: Once | INTRAVENOUS | Status: AC
  Administered 2014-08-15: 500 mg via INTRAVENOUS
  Filled 2014-08-15 (×2): qty 100

## 2014-08-15 MED ORDER — CIPROFLOXACIN HCL 500 MG PO TABS: 500.0000 mg | ORAL_TABLET | Freq: Two times a day (BID) | ORAL | Status: DC

## 2014-08-15 MED ORDER — AZITHROMYCIN 250 MG PO TABS
1000.0000 mg | ORAL_TABLET | Freq: Once | ORAL | Status: AC
  Administered 2014-08-15: 1000 mg via ORAL
  Filled 2014-08-15: qty 4

## 2014-08-15 NOTE — ED Notes (Signed)
Pt. Provided [place] sandwich and soda.

## 2014-08-15 NOTE — Progress Notes (Signed)
LCSW met with patient per MD request as patient wants to be placed in a "rest home".  Patient does have medicaid and would be eligible for long term placement such as a group home of ALF however at this time patient does not want to use entire check on placement.  Patient was given options and education regarding placement.  Patient reports she likes her medicaid check and the freedom to choose where her money goes.  Her living situation is fair, but she is tired of being taken advantage of, fearful of guns, and people breaking into her room.    LCSW offered other suggestions such as boarding rooms, long stay motels in which patient would be willing to check out and look for.  If patient does become interested she was directed to go to the [facility] and speak to a case manager or congressional nurse about placement options.    She does not qualify for SNF at this time as she does not have a rehab need.  Patient plans to DC back to where she is living at the current time.  Lane Hacker, MSW Clinical Social Work: Emergency Room [phone]

## 2014-08-15 NOTE — ED Notes (Signed)
Pt reports vaginal pain for 1 week with white discharge is painful to urinate.

## 2014-08-15 NOTE — ED Provider Notes (Signed)
CSN: [id number]     Arrival date & time 08/15/14  1115 History   First MD Initiated Contact with Patient 08/15/14 1221     No chief complaint on file.    (Consider location/radiation/quality/duration/timing/severity/associated sxs/prior Treatment) HPI    PCP: No PCP Per Patient Blood pressure 112/69, pulse 61, temperature 98 F (36.7 C), temperature source Oral, resp. rate 14, SpO2 100 %.  Karen Best is a 59 y.o.female with a significant PMH of bipolar, schizophrenia, fibromyalgia, tobacco abuse presents to the ER with complaints of vaginal pain and dysuria. She denies being sexually active. She has had pain for 1 week which she describes as painful. White discharge. No abnormal smell or blood in urine. She denies fevers, abdominal pain, nausea, vomiting, diarrhea or syncope. Pt in no acute distress with vital signs WNL.  She takes many medications and endorsing being compliant with her medications.    Past Medical History  Diagnosis Date  . Fibromyalgia   . Schizophrenia   . Asthma   . Bipolar 1 disorder   . Tobacco abuse    Past Surgical History  Procedure Laterality Date  . Cesarean section    . Tubal ligation     No family history on file. History  Substance Use Topics  . Smoking status: Current Every Day Smoker -- 0.50 packs/day    Types: Cigarettes  . Smokeless tobacco: Not on file  . Alcohol Use: Yes     Comment: occ   OB History    No data available     Review of Systems  10 Systems reviewed and are negative for acute change except as noted in the HPI.   Allergies  Penicillins  Home Medications   Prior to Admission medications   Medication Sig Start Date End Date Taking? Authorizing Provider  aspirin EC 325 MG tablet Take 325 mg by mouth daily as needed for mild pain.   Yes Historical Provider, MD  beclomethasone (QVAR) 40 MCG/ACT inhaler Inhale 2 puffs into the lungs 2 (two) times daily.   Yes Historical Provider, MD  benztropine (COGENTIN)  0.5 MG tablet Take 0.5 mg by mouth 2 (two) times daily.   Yes Historical Provider, MD  haloperidol (HALDOL) 1 MG tablet Take 1 mg by mouth at bedtime as needed for agitation.   Yes Historical Provider, MD  lithium carbonate (ESKALITH) 450 MG CR tablet Take 450 mg by mouth 2 (two) times daily.   Yes Historical Provider, MD  paliperidone (INVEGA) 9 MG 24 hr tablet Take 9 mg by mouth every morning.   Yes Historical Provider, MD  cephALEXin (KEFLEX) 500 MG capsule Take 1 capsule (500 mg total) by mouth 2 (two) times daily. Patient not taking: Reported on 08/15/2014 07/30/14   Quintella Reichert, MD  ciprofloxacin (CIPRO) 500 MG tablet Take 1 tablet (500 mg total) by mouth 2 (two) times daily. 08/15/14   Daquana Paddock Carlota Raspberry, PA-C  metroNIDAZOLE (FLAGYL) 500 MG tablet Take 1 tablet (500 mg total) by mouth 2 (two) times daily. Patient not taking: Reported on 08/15/2014 07/30/14   Quintella Reichert, MD   BP 138/82 mmHg  Pulse 56  Temp(Src) 98 F (36.7 C) (Oral)  Resp 14  SpO2 100% Physical Exam  Constitutional: She appears well-developed and well-nourished. No distress.  HENT:  Head: Normocephalic and atraumatic.  Eyes: Pupils are equal, round, and reactive to light.  Neck: Normal range of motion. Neck supple.  Cardiovascular: Normal rate and regular rhythm.   Pulmonary/Chest: Effort normal.  Abdominal:  Soft. Bowel sounds are normal. There is no tenderness. There is no rigidity, no rebound, no guarding and no CVA tenderness.  Genitourinary: Uterus normal. There is no tenderness or lesion on the right labia. There is no tenderness or lesion on the left labia. Cervix exhibits no motion tenderness, no discharge and no friability. Right adnexum displays no mass and no tenderness. Left adnexum displays no mass and no tenderness. No bleeding in the vagina. No foreign body around the vagina. No signs of injury around the vagina. Vaginal discharge (white) found.  Neurological: She is alert.  Skin: Skin is warm and dry.   Psychiatric: Her behavior is normal. Her mood appears not anxious. She is not actively hallucinating. She does not exhibit a depressed mood.  Nursing note and vitals reviewed.   ED Course  Procedures (including critical care time) Labs Review Labs Reviewed  WET PREP, GENITAL - Abnormal; Notable for the following:    Clue Cells Wet Prep HPF POC FEW (*)    WBC, Wet Prep HPF POC TOO NUMEROUS TO COUNT (*)    All other components within normal limits  URINALYSIS, ROUTINE W REFLEX MICROSCOPIC - Abnormal; Notable for the following:    APPearance TURBID (*)    Hgb urine dipstick TRACE (*)    Nitrite POSITIVE (*)    Leukocytes, UA LARGE (*)    All other components within normal limits  URINE MICROSCOPIC-ADD ON - Abnormal; Notable for the following:    Squamous Epithelial / LPF FEW (*)    Bacteria, UA MANY (*)    All other components within normal limits  CBC WITH DIFFERENTIAL/PLATELET - Abnormal; Notable for the following:    WBC 3.6 (*)    RBC 5.60 (*)    MCV 74.1 (*)    MCH 23.9 (*)    Neutrophils Relative % 28 (*)    Neutro Abs 1.0 (*)    Lymphocytes Relative 56 (*)    Eosinophils Relative 9 (*)    All other components within normal limits  BASIC METABOLIC PANEL - Abnormal; Notable for the following:    Glucose, Bld 64 (*)    BUN <5 (*)    All other components within normal limits  I-STAT BETA HCG BLOOD, ED ([facility], [facility], [facility] ONLY) - Abnormal; Notable for the following:    I-stat hCG, quantitative 7.4 (*)    All other components within normal limits  GC/CHLAMYDIA PROBE AMP ([place])    Imaging Review No results found.   EKG Interpretation None      MDM   Final diagnoses:  Vaginitis  UTI (lower urinary tract infection)  Homelessness   Medications  levofloxacin (LEVAQUIN) IVPB 500 mg (500 mg Intravenous New Bag/Given 08/15/14 1345)  azithromycin (ZITHROMAX) tablet 1,000 mg (1,000 mg Oral Given 08/15/14 1411)   1:52 pm Patient has significant UTI. Will treat with IV  dose of abx and give rx for Cipro. She has a penicillin allergy therefore Rocephin was avoided. Her CBC, wet prep and BMP are pending.  2:40 pm Patient has UTI and vaginal infection. Given Azithromycin 1 gram, refrained from Rocephin due to Penicillin allergy.  Case Manager was asked to talk to patient due to her requesting to be placed in a rest home. Jarrett Soho with SW spoke with her and said they could work on placing her but she would have to forfeit her monthly government checks so she declined.   Rx: Cipro for UTI. Education given on safe sex and hygiene .  59 y.o.Marilynne Halsted Bazar's evaluation in the Emergency Department is complete. It has been determined that no acute conditions requiring further emergency intervention are present at this time. The patient/guardian have been advised of the diagnosis and plan. We have discussed signs and symptoms that warrant return to the ED, such as changes or worsening in symptoms.  Vital signs are stable at discharge. Filed Vitals:   08/15/14 1430  BP: 138/82  Pulse: 56  Temp:   Resp:     Patient/guardian has voiced understanding and agreed to follow-up with the PCP or specialist.     Delos Haring, PA-C 08/15/14 [place], MD 08/18/14 [phone]

## 2014-08-15 NOTE — Discharge Instructions (Signed)
Sexually Transmitted Disease A sexually transmitted disease (STD) is a disease or infection often passed to another person during sex. However, STDs can be passed through nonsexual ways. An STD can be passed through:  Spit (saliva).  Semen.  Blood.  Mucus from the vagina.  Pee (urine). HOW CAN I LESSEN MY CHANCES OF GETTING AN STD?  Use:  Latex condoms.  Water-soluble lubricants with condoms. Do not use petroleum jelly or oils.  Dental dams. These are small pieces of latex that are used as a barrier during oral sex.  Avoid having more than one sex partner.  Do not have sex with someone who has other sex partners.  Do not have sex with anyone you do not know or who is at high risk for an STD.  Avoid risky sex that can break your skin.  Do not have sex if you have open sores on your mouth or skin.  Avoid drinking too much alcohol or taking illegal drugs. Alcohol and drugs can affect your good judgment.  Avoid oral and anal sex acts.  Get shots (vaccines) for HPV and hepatitis.  If you are at risk of being infected with HIV, it is advised that you take a certain medicine daily to prevent HIV infection. This is called pre-exposure prophylaxis (PrEP). You may be at risk if:  You are a man who has sex with other men (MSM).  You are attracted to the opposite sex (heterosexual) and are having sex with more than one partner.  You take drugs with a needle.  You have sex with someone who has HIV.  Talk with your doctor about if you are at high risk of being infected with HIV. If you begin to take PrEP, get tested for HIV first. Get tested every 3 months for as long as you are taking PrEP. WHAT SHOULD I DO IF I THINK I HAVE AN STD?  See your doctor.  Tell your sex partner(s) that you have an STD. They should be tested and treated.  Do not have sex until your doctor says it is okay. WHEN SHOULD I GET HELP? Get help right away if:  You have bad belly (abdominal)  pain.  You are a man and have puffiness (swelling) or pain in your testicles.  You are a woman and have puffiness in your vagina. Document Released: 05/22/2004 Document Revised: 04/19/2013 Document Reviewed: 10/08/2012 [facility] Patient Information 2015 [place], [place]. This information is not intended to replace advice given to you by your health care provider. Make sure you discuss any questions you have with your health care provider.  Urinary Tract Infection A urinary tract infection (UTI) can occur any place along the urinary tract. The tract includes the kidneys, ureters, bladder, and urethra. A type of germ called bacteria often causes a UTI. UTIs are often helped with antibiotic medicine.  HOME CARE   If given, take antibiotics as told by your doctor. Finish them even if you start to feel better.  Drink enough fluids to keep your pee (urine) clear or pale yellow.  Avoid tea, drinks with caffeine, and bubbly (carbonated) drinks.  Pee often. Avoid holding your pee in for a long time.  Pee before and after having sex (intercourse).  Wipe from front to back after you poop (bowel movement) if you are a woman. Use each tissue only once. GET HELP RIGHT AWAY IF:   You have back pain.  You have lower belly (abdominal) pain.  You have chills.  You feel  sick to your stomach (nauseous).  You throw up (vomit).  Your burning or discomfort with peeing does not go away.  You have a fever.  Your symptoms are not better in 3 days. MAKE SURE YOU:   Understand these instructions.  Will watch your condition.  Will get help right away if you are not doing well or get worse. Document Released: 10/01/2007 Document Revised: 01/07/2012 Document Reviewed: 11/13/2011 [facility] Patient Information 2015 [place], [place]. This information is not intended to replace advice given to you by your health care provider. Make sure you discuss any questions you have with your health care  provider.  Atrophic Vaginitis Atrophic vaginitis is a problem of low levels of estrogen in women. This problem can happen at any age. It is most common in women who have gone through menopause ("the change").  HOW WILL I KNOW IF I HAVE THIS PROBLEM? You may have:  Trouble with peeing (urinating), such as:  Going to the bathroom often.  A hard time holding your pee until you reach a bathroom.  Leaking pee.  Having pain when you pee.  Itching or a burning feeling.  Vaginal bleeding and spotting.  Pain during sex.  Dryness of the vagina.  A yellow, bad-smelling fluid (discharge) coming from the vagina. HOW WILL MY DOCTOR CHECK FOR THIS PROBLEM?  During your exam, your doctor will likely find the problem.  If there is a vaginal fluid, it may be checked for infection. HOW WILL THIS PROBLEM BE TREATED? Keep the vulvar skin as clean as possible. Moisturizers and lubricants can help with some of the symptoms. Estrogen replacement can help. There are 2 ways to take estrogen:  Systemic estrogen gets estrogen to your whole body. It takes many weeks or months before the symptoms get better.  You take an estrogen pill.  You use a skin patch. This is a patch that you put on your skin.  If you still have your uterus, your doctor may ask you to take a hormone. Talk to your doctor about the right medicine for you.  Estrogen cream.  This puts estrogen only at the part of your body where you apply it. The cream is put into the vagina or put on the vulvar skin. For some women, estrogen cream works faster than pills or the patch. CAN ALL WOMEN WITH THIS PROBLEM USE ESTROGEN? No. Women with certain types of cancer, liver problems, or problems with blood clots should not take estrogen. Your doctor can help you decide the best treatment for your symptoms. Document Released: 10/01/2007 Document Revised: 04/19/2013 Document Reviewed: 10/01/2007 [facility] Patient Information 2015 [place], [place].  This information is not intended to replace advice given to you by your health care provider. Make sure you discuss any questions you have with your health care provider.

## 2014-08-16 LAB — GC/CHLAMYDIA PROBE AMP (~~LOC~~) NOT AT ARMC
Chlamydia: NEGATIVE
Neisseria Gonorrhea: NEGATIVE

## 2014-08-29 ENCOUNTER — Emergency Department (HOSPITAL_COMMUNITY)
Admission: EM | Admit: 2014-08-29 | Discharge: 2014-08-29 | Disposition: A | Discharge status: Home / Self Care | Payer: Medicaid Other | Attending: Emergency Medicine | Admitting: Emergency Medicine

## 2014-08-29 ENCOUNTER — Encounter (HOSPITAL_COMMUNITY): Payer: Self-pay | Admitting: *Deleted

## 2014-08-29 ENCOUNTER — Emergency Department (HOSPITAL_COMMUNITY): Payer: Medicaid Other

## 2014-08-29 DIAGNOSIS — Z720 Tobacco use: Secondary | ICD-10-CM | POA: Diagnosis not present

## 2014-08-29 DIAGNOSIS — B9689 Other specified bacterial agents as the cause of diseases classified elsewhere: Secondary | ICD-10-CM

## 2014-08-29 DIAGNOSIS — F209 Schizophrenia, unspecified: Secondary | ICD-10-CM | POA: Insufficient documentation

## 2014-08-29 DIAGNOSIS — N390 Urinary tract infection, site not specified: Secondary | ICD-10-CM | POA: Diagnosis not present

## 2014-08-29 DIAGNOSIS — N760 Acute vaginitis: Secondary | ICD-10-CM | POA: Insufficient documentation

## 2014-08-29 DIAGNOSIS — D170 Benign lipomatous neoplasm of skin and subcutaneous tissue of head, face and neck: Secondary | ICD-10-CM | POA: Diagnosis not present

## 2014-08-29 DIAGNOSIS — Z79899 Other long term (current) drug therapy: Secondary | ICD-10-CM | POA: Insufficient documentation

## 2014-08-29 DIAGNOSIS — R0789 Other chest pain: Secondary | ICD-10-CM | POA: Diagnosis not present

## 2014-08-29 DIAGNOSIS — Z880 Allergy status to penicillin: Secondary | ICD-10-CM | POA: Insufficient documentation

## 2014-08-29 DIAGNOSIS — Z3202 Encounter for pregnancy test, result negative: Secondary | ICD-10-CM | POA: Diagnosis not present

## 2014-08-29 DIAGNOSIS — K219 Gastro-esophageal reflux disease without esophagitis: Secondary | ICD-10-CM | POA: Diagnosis not present

## 2014-08-29 DIAGNOSIS — J45909 Unspecified asthma, uncomplicated: Secondary | ICD-10-CM | POA: Diagnosis not present

## 2014-08-29 DIAGNOSIS — Z7951 Long term (current) use of inhaled steroids: Secondary | ICD-10-CM | POA: Diagnosis not present

## 2014-08-29 DIAGNOSIS — Z792 Long term (current) use of antibiotics: Secondary | ICD-10-CM | POA: Insufficient documentation

## 2014-08-29 DIAGNOSIS — F319 Bipolar disorder, unspecified: Secondary | ICD-10-CM | POA: Diagnosis not present

## 2014-08-29 DIAGNOSIS — R079 Chest pain, unspecified: Secondary | ICD-10-CM | POA: Diagnosis present

## 2014-08-29 LAB — URINALYSIS, ROUTINE W REFLEX MICROSCOPIC
Bilirubin Urine: NEGATIVE
Glucose, UA: NEGATIVE mg/dL
Hgb urine dipstick: NEGATIVE
Ketones, ur: NEGATIVE mg/dL
Nitrite: POSITIVE — AB
Protein, ur: NEGATIVE mg/dL
Specific Gravity, Urine: 1.004 — ABNORMAL LOW (ref 1.005–1.030)
Urobilinogen, UA: 0.2 mg/dL (ref 0.0–1.0)
pH: 6.5 (ref 5.0–8.0)

## 2014-08-29 LAB — BASIC METABOLIC PANEL
Anion gap: 9 (ref 5–15)
BUN: 5 mg/dL — ABNORMAL LOW (ref 6–20)
CO2: 22 mmol/L (ref 22–32)
Calcium: 9.1 mg/dL (ref 8.9–10.3)
Chloride: 105 mmol/L (ref 101–111)
Creatinine, Ser: 0.71 mg/dL (ref 0.44–1.00)
GFR calc Af Amer: >60 mL/min (ref >60)
GFR calc non Af Amer: >60 mL/min (ref >60)
Glucose, Bld: 117 mg/dL — ABNORMAL HIGH (ref 70–99)
Potassium: 3.9 mmol/L (ref 3.5–5.1)
Sodium: 136 mmol/L (ref 135–145)

## 2014-08-29 LAB — I-STAT TROPONIN, ED: Troponin i, poc: 0 ng/mL (ref 0.00–0.08)

## 2014-08-29 LAB — CBC
HCT: 38.5 % (ref 36.0–46.0)
Hemoglobin: 11.9 g/dL — ABNORMAL LOW (ref 12.0–15.0)
MCH: 23 pg — ABNORMAL LOW (ref 26.0–34.0)
MCHC: 30.9 g/dL (ref 30.0–36.0)
MCV: 74.3 fL — ABNORMAL LOW (ref 78.0–100.0)
Platelets: 262 10*3/uL (ref 150–400)
RBC: 5.18 MIL/uL — ABNORMAL HIGH (ref 3.87–5.11)
RDW: 14.9 % (ref 11.5–15.5)
WBC: 4.1 10*3/uL (ref 4.0–10.5)

## 2014-08-29 LAB — WET PREP, GENITAL
Trich, Wet Prep: NONE SEEN
Yeast Wet Prep HPF POC: NONE SEEN

## 2014-08-29 LAB — URINE MICROSCOPIC-ADD ON

## 2014-08-29 LAB — PREGNANCY, URINE: Preg Test, Ur: NEGATIVE

## 2014-08-29 MED ORDER — PANTOPRAZOLE SODIUM 40 MG PO TBEC: 40.0000 mg | DELAYED_RELEASE_TABLET | Freq: Every day | ORAL | Status: DC

## 2014-08-29 MED ORDER — NITROFURANTOIN MONOHYD MACRO 100 MG PO CAPS: 100.0000 mg | ORAL_CAPSULE | Freq: Two times a day (BID) | ORAL | Status: DC

## 2014-08-29 MED ORDER — METRONIDAZOLE 500 MG PO TABS: 500.0000 mg | ORAL_TABLET | Freq: Two times a day (BID) | ORAL | Status: DC

## 2014-08-29 MED ORDER — ONDANSETRON HCL 4 MG PO TABS: 4.0000 mg | ORAL_TABLET | Freq: Three times a day (TID) | ORAL | Status: DC | PRN

## 2014-08-29 NOTE — ED Provider Notes (Signed)
CSN: [id number]     Arrival date & time 08/29/14  1520 History   First MD Initiated Contact with Patient 08/29/14 1810     Chief Complaint  Patient presents with  . Exposure to STD  . Neck Pain  . Chest Pain     (Consider location/radiation/quality/duration/timing/severity/associated sxs/prior Treatment) HPI Karen Best is a 59 year old female past medical history of schizophrenia, bipolar 1 disorder, tobacco abuse who presents the ER with multiple complaints. Patient states she stopped using crack approximately one week ago as she is trying to detox from area she states she has been to [facility], and states she has an outpatient program set up for her which begins next week. Patient states since stopping her use of crack she has been experiencing chest discomfort which occurs at night. Patient states this pain wakes her up in the middle of night, and she has some associated reflux with it. Patient states she has rarely felt this pain during the day. She states that she typically gets up and walks around the room at her house which helped relieve the pain. She is not identified any aggravating factors. Patient reports since she has stopped using crack she's also had persistent nausea without vomiting. Patient also is here stating that she is been experiencing some vaginal itching. She states she is requesting an STD check today. Patient denies vaginal discharge, pelvic pain, abdominal pain, vomiting, diarrhea, fever, shortness of breath, palpitations, headache, blurred vision, dizziness, weakness.  Past Medical History  Diagnosis Date  . Fibromyalgia   . Schizophrenia   . Asthma   . Bipolar 1 disorder   . Tobacco abuse    Past Surgical History  Procedure Laterality Date  . Cesarean section    . Tubal ligation     No family history on file. History  Substance Use Topics  . Smoking status: Current Every Day Smoker -- 0.50 packs/day    Types: Cigarettes  . Smokeless tobacco: Not on file   . Alcohol Use: Yes     Comment: occ   OB History    No data available     Review of Systems  Constitutional: Negative for fever.  HENT: Negative for trouble swallowing.   Eyes: Negative for visual disturbance.  Respiratory: Negative for shortness of breath.   Cardiovascular: Negative for palpitations.  Gastrointestinal: Positive for nausea. Negative for vomiting and abdominal pain.  Genitourinary: Negative for dysuria.  Musculoskeletal: Negative for neck pain.  Skin: Negative for rash.  Neurological: Negative for dizziness, weakness and numbness.  Psychiatric/Behavioral: Negative.       Allergies  Penicillins  Home Medications   Prior to Admission medications   Medication Sig Start Date End Date Taking? Authorizing Provider  beclomethasone (QVAR) 40 MCG/ACT inhaler Inhale 2 puffs into the lungs 2 (two) times daily.   Yes Historical Provider, MD  ciprofloxacin (CIPRO) 500 MG tablet Take 1 tablet (500 mg total) by mouth 2 (two) times daily. 08/15/14  Yes Tiffany Carlota Raspberry, PA-C  lithium carbonate (ESKALITH) 450 MG CR tablet Take 450 mg by mouth 2 (two) times daily.   Yes Historical Provider, MD  aspirin EC 325 MG tablet Take 325 mg by mouth daily as needed for mild pain.    Historical Provider, MD  benztropine (COGENTIN) 0.5 MG tablet Take 0.5 mg by mouth 2 (two) times daily.    Historical Provider, MD  cephALEXin (KEFLEX) 500 MG capsule Take 1 capsule (500 mg total) by mouth 2 (two) times daily. Patient not taking: Reported  on 08/15/2014 07/30/14   Quintella Reichert, MD  haloperidol (HALDOL) 1 MG tablet Take 1 mg by mouth at bedtime as needed for agitation.    Historical Provider, MD  metroNIDAZOLE (FLAGYL) 500 MG tablet Take 1 tablet (500 mg total) by mouth 2 (two) times daily. One po bid x 7 days 08/29/14   Dahlia Bailiff, PA-C  nitrofurantoin, macrocrystal-monohydrate, (MACROBID) 100 MG capsule Take 1 capsule (100 mg total) by mouth 2 (two) times daily. 08/29/14   Dahlia Bailiff, PA-C   ondansetron (ZOFRAN) 4 MG tablet Take 1 tablet (4 mg total) by mouth every 8 (eight) hours as needed for nausea or vomiting. 08/29/14   Dahlia Bailiff, PA-C  paliperidone (INVEGA) 9 MG 24 hr tablet Take 9 mg by mouth every morning.    Historical Provider, MD  pantoprazole (PROTONIX) 40 MG tablet Take 1 tablet (40 mg total) by mouth daily. 08/29/14   Dahlia Bailiff, PA-C   BP 117/65 mmHg  Pulse 66  Temp(Src) 97.9 F (36.6 C) (Oral)  Resp 20  SpO2 100% Physical Exam  Constitutional: She is oriented to person, place, and time. She appears well-developed and well-nourished. No distress.  HENT:  Head: Normocephalic and atraumatic.  Mouth/Throat: Oropharynx is clear and moist. No oropharyngeal exudate.  Eyes: Right eye exhibits no discharge. Left eye exhibits no discharge. No scleral icterus.  Neck: Normal range of motion.  Cardiovascular: Normal rate, regular rhythm, S1 normal, S2 normal and normal heart sounds.   No murmur heard. Pulmonary/Chest: Breath sounds normal. No accessory muscle usage. No tachypnea. No respiratory distress.  Abdominal: Soft. Normal appearance. There is generalized tenderness. There is no rigidity, no guarding, no tenderness at McBurney's point and negative Murphy's sign.  Mild, generalized tenderness.  Musculoskeletal: Normal range of motion. She exhibits no edema or tenderness.  Neurological: She is alert and oriented to person, place, and time. No cranial nerve deficit. Coordination normal.  Skin: Skin is warm and dry. No rash noted. She is not diaphoretic.  Psychiatric: She has a normal mood and affect.  Nursing note and vitals reviewed.   ED Course  Procedures (including critical care time) Labs Review Labs Reviewed  WET PREP, GENITAL - Abnormal; Notable for the following:    Clue Cells Wet Prep HPF POC MANY (*)    WBC, Wet Prep HPF POC FEW (*)    All other components within normal limits  CBC - Abnormal; Notable for the following:    RBC 5.18 (*)    Hemoglobin  11.9 (*)    MCV 74.3 (*)    MCH 23.0 (*)    All other components within normal limits  BASIC METABOLIC PANEL - Abnormal; Notable for the following:    Glucose, Bld 117 (*)    BUN 5 (*)    All other components within normal limits  URINALYSIS, ROUTINE W REFLEX MICROSCOPIC - Abnormal; Notable for the following:    APPearance CLOUDY (*)    Specific Gravity, Urine 1.004 (*)    Nitrite POSITIVE (*)    Leukocytes, UA MODERATE (*)    All other components within normal limits  URINE MICROSCOPIC-ADD ON - Abnormal; Notable for the following:    Squamous Epithelial / LPF FEW (*)    Bacteria, UA MANY (*)    All other components within normal limits  URINE CULTURE  PREGNANCY, URINE  RPR  HIV ANTIBODY (ROUTINE TESTING)  I-STAT TROPOININ, ED  GC/CHLAMYDIA PROBE AMP ([place])    Imaging Review Dg Chest 2 View  08/29/2014   CLINICAL DATA:  Chest discomfort and nausea. Patient undergoing detox for crack cocaine use  EXAM: CHEST  2 VIEW  COMPARISON:  June 15, 2014  FINDINGS: There are bilateral nipple shadows. There is no edema or consolidation. Heart size and pulmonary vascularity are normal. No adenopathy. No pneumothorax. No bone lesions. There are several small metallic fragments overlying the left shoulder.  IMPRESSION: No edema or consolidation.   Electronically Signed   By: Lowella Grip III M.D.   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 Chest pain Patient is to be  discharged with recommendation to follow up with PCP in regards to today's hospital visit. Chest pain is not likely of cardiac or pulmonary etiology d/t presentation, Wells criteria 0 for PE, VSS, no tracheal deviation, no JVD or new murmur, RRR, breath sounds equal bilaterally, EKG without acute abnormalities, negative troponin in the light of one week's worth of symptoms of atypical pain, more consistent with GERD esophagitis, and negative CXR. Pt has been advised start a PPI and return to the ED is CP becomes exertional, associated with diaphoresis or nausea, radiates to left jaw/arm, worsens or becomes concerning in any way. Pt appears reliable for follow up and is agreeable to discharge.   2. Urinary tract infection Patient sent with prescription for Macrobid, urine culture sent.  3. Bacterial vaginosis Patient sent with Flagyl, strongly encouraged to follow-up with primary care provider.  4. Neck pain Neck pain as a small lump on the superficial portion of patient's left lateral neck. This is consistent with a lipoma. Also encouraged patient to follow up regarding lipoma  of her neck with her primary care provider.  Return precautions discussed with patient, patient given referral to [facility] and wellness and given resource guide to help find PCP. patient verbalizes understanding and agreement this plan. Encouraged patient to call or return to the ER with any worsening of symptoms or should she have a questions or concerns.  BP 117/65 mmHg  Pulse 66  Temp(Src) 97.9 F (36.6 C) (Oral)  Resp 20  SpO2 100%  Signed,  Dahlia Bailiff, PA-C 2:16 AM      Dahlia Bailiff, PA-C 08/30/14 1324  Alfonzo Beers, MD 08/30/14 [phone]

## 2014-08-29 NOTE — Discharge Instructions (Signed)
Gastroesophageal Reflux Disease, Adult Gastroesophageal reflux disease (GERD) happens when acid from your stomach flows up into the esophagus. When acid comes in contact with the esophagus, the acid causes soreness (inflammation) in the esophagus. Over time, GERD may create small holes (ulcers) in the lining of the esophagus. CAUSES   Increased body weight. This puts pressure on the stomach, making acid rise from the stomach into the esophagus.  Smoking. This increases acid production in the stomach.  Drinking alcohol. This causes decreased pressure in the lower esophageal sphincter (valve or ring of muscle between the esophagus and stomach), allowing acid from the stomach into the esophagus.  Late evening meals and a full stomach. This increases pressure and acid production in the stomach.  A malformed lower esophageal sphincter. Sometimes, no cause is found. SYMPTOMS   Burning pain in the lower part of the mid-chest behind the breastbone and in the mid-stomach area. This may occur twice a week or more often.  Trouble swallowing.  Sore throat.  Dry cough.  Asthma-like symptoms including chest tightness, shortness of breath, or wheezing. DIAGNOSIS  Your caregiver may be able to diagnose GERD based on your symptoms. In some cases, X-rays and other tests may be done to check for complications or to check the condition of your stomach and esophagus. TREATMENT  Your caregiver may recommend over-the-counter or prescription medicines to help decrease acid production. Ask your caregiver before starting or adding any new medicines.  HOME CARE INSTRUCTIONS   Change the factors that you can control. Ask your caregiver for guidance concerning weight loss, quitting smoking, and alcohol consumption.  Avoid foods and drinks that make your symptoms worse, such as:  Caffeine or alcoholic drinks.  Chocolate.  Peppermint or mint flavorings.  Garlic and onions.  Spicy foods.  Citrus fruits,  such as oranges, lemons, or limes.  Tomato-based foods such as sauce, chili, salsa, and pizza.  Fried and fatty foods.  Avoid lying down for the 3 hours prior to your bedtime or prior to taking a nap.  Eat small, frequent meals instead of large meals.  Wear loose-fitting clothing. Do not wear anything tight around your waist that causes pressure on your stomach.  Raise the head of your bed 6 to 8 inches with wood blocks to help you sleep. Extra pillows will not help.  Only take over-the-counter or prescription medicines for pain, discomfort, or fever as directed by your caregiver.  Do not take aspirin, ibuprofen, or other nonsteroidal anti-inflammatory drugs (NSAIDs). SEEK IMMEDIATE MEDICAL CARE IF:   You have pain in your arms, neck, jaw, teeth, or back.  Your pain increases or changes in intensity or duration.  You develop nausea, vomiting, or sweating (diaphoresis).  You develop shortness of breath, or you faint.  Your vomit is green, yellow, black, or looks like coffee grounds or blood.  Your stool is red, bloody, or black. These symptoms could be signs of other problems, such as heart disease, gastric bleeding, or esophageal bleeding. MAKE SURE YOU:   Understand these instructions.  Will watch your condition.  Will get help right away if you are not doing well or get worse. Document Released: 01/22/2005 Document Revised: 07/07/2011 Document Reviewed: 11/01/2010 [facility] Patient Information 2015 [place], [place]. This information is not intended to replace advice given to you by your health care provider. Make sure you discuss any questions you have with your health care provider.  Food Choices for Gastroesophageal Reflux Disease When you have gastroesophageal reflux disease (GERD), the foods  you eat and your eating habits are very important. Choosing the right foods can help ease the discomfort of GERD. WHAT GENERAL GUIDELINES DO I NEED TO FOLLOW?  Choose fruits,  vegetables, whole grains, low-fat dairy products, and low-fat meat, fish, and poultry.  Limit fats such as oils, salad dressings, butter, nuts, and avocado.  Keep a food diary to identify foods that cause symptoms.  Avoid foods that cause reflux. These may be different for different people.  Eat frequent small meals instead of three large meals each day.  Eat your meals slowly, in a relaxed setting.  Limit fried foods.  Cook foods using methods other than frying.  Avoid drinking alcohol.  Avoid drinking large amounts of liquids with your meals.  Avoid bending over or lying down until 2-3 hours after eating. WHAT FOODS ARE NOT RECOMMENDED? The following are some foods and drinks that may worsen your symptoms: Vegetables Tomatoes. Tomato juice. Tomato and spaghetti sauce. Chili peppers. Onion and garlic. Horseradish. Fruits Oranges, grapefruit, and lemon (fruit and juice). Meats High-fat meats, fish, and poultry. This includes hot dogs, ribs, ham, sausage, salami, and bacon. Dairy Whole milk and chocolate milk. Sour cream. Cream. Butter. Ice cream. Cream cheese.  Beverages Coffee and tea, with or without caffeine. Carbonated beverages or energy drinks. Condiments Hot sauce. Barbecue sauce.  Sweets/Desserts Chocolate and cocoa. Donuts. Peppermint and spearmint. Fats and Oils High-fat foods, including [place] fries and potato chips. Other Vinegar. Strong spices, such as black pepper, white pepper, red pepper, cayenne, curry powder, cloves, ginger, and chili powder. The items listed above may not be a complete list of foods and beverages to avoid. Contact your dietitian for more information. Document Released: 04/14/2005 Document Revised: 04/19/2013 Document Reviewed: 02/16/2013 [facility] Patient Information 2015 [place], [place]. This information is not intended to replace advice given to you by your health care provider. Make sure you discuss any questions you have with your  health care provider.  Urinary Tract Infection Urinary tract infections (UTIs) can develop anywhere along your urinary tract. Your urinary tract is your body's drainage system for removing wastes and extra water. Your urinary tract includes two kidneys, two ureters, a bladder, and a urethra. Your kidneys are a pair of bean-shaped organs. Each kidney is about the size of your fist. They are located below your ribs, one on each side of your spine. CAUSES Infections are caused by microbes, which are microscopic organisms, including fungi, viruses, and bacteria. These organisms are so small that they can only be seen through a microscope. Bacteria are the microbes that most commonly cause UTIs. SYMPTOMS  Symptoms of UTIs may vary by age and gender of the patient and by the location of the infection. Symptoms in young women typically include a frequent and intense urge to urinate and a painful, burning feeling in the bladder or urethra during urination. Older women and men are more likely to be tired, shaky, and weak and have muscle aches and abdominal pain. A fever may mean the infection is in your kidneys. Other symptoms of a kidney infection include pain in your back or sides below the ribs, nausea, and vomiting. DIAGNOSIS To diagnose a UTI, your caregiver will ask you about your symptoms. Your caregiver also will ask to provide a urine sample. The urine sample will be tested for bacteria and white blood cells. White blood cells are made by your body to help fight infection. TREATMENT  Typically, UTIs can be treated with medication. Because most UTIs are caused  by a bacterial infection, they usually can be treated with the use of antibiotics. The choice of antibiotic and length of treatment depend on your symptoms and the type of bacteria causing your infection. HOME CARE INSTRUCTIONS  If you were prescribed antibiotics, take them exactly as your caregiver instructs you. Finish the medication even if you  feel better after you have only taken some of the medication.  Drink enough water and fluids to keep your urine clear or pale yellow.  Avoid caffeine, tea, and carbonated beverages. They tend to irritate your bladder.  Empty your bladder often. Avoid holding urine for long periods of time.  Empty your bladder before and after sexual intercourse.  After a bowel movement, women should cleanse from front to back. Use each tissue only once. SEEK MEDICAL CARE IF:   You have back pain.  You develop a fever.  Your symptoms do not begin to resolve within 3 days. SEEK IMMEDIATE MEDICAL CARE IF:   You have severe back pain or lower abdominal pain.  You develop chills.  You have nausea or vomiting.  You have continued burning or discomfort with urination. MAKE SURE YOU:   Understand these instructions.  Will watch your condition.  Will get help right away if you are not doing well or get worse. Document Released: 01/22/2005 Document Revised: 10/14/2011 Document Reviewed: 05/23/2011 [facility] Patient Information 2015 [place], [place]. This information is not intended to replace advice given to you by your health care provider. Make sure you discuss any questions you have with your health care provider.  Bacterial Vaginosis Bacterial vaginosis is a vaginal infection that occurs when the normal balance of bacteria in the vagina is disrupted. It results from an overgrowth of certain bacteria. This is the most common vaginal infection in women of childbearing age. Treatment is important to prevent complications, especially in pregnant women, as it can cause a premature delivery. CAUSES  Bacterial vaginosis is caused by an increase in harmful bacteria that are normally present in smaller amounts in the vagina. Several different kinds of bacteria can cause bacterial vaginosis. However, the reason that the condition develops is not fully understood. RISK FACTORS Certain activities or behaviors  can put you at an increased risk of developing bacterial vaginosis, including:  Having a new sex partner or multiple sex partners.  Douching.  Using an intrauterine device (IUD) for contraception. Women do not get bacterial vaginosis from toilet seats, bedding, swimming pools, or contact with objects around them. SIGNS AND SYMPTOMS  Some women with bacterial vaginosis have no signs or symptoms. Common symptoms include:  Grey vaginal discharge.  A fishlike odor with discharge, especially after sexual intercourse.  Itching or burning of the vagina and vulva.  Burning or pain with urination. DIAGNOSIS  Your health care provider will take a medical history and examine the vagina for signs of bacterial vaginosis. A sample of vaginal fluid may be taken. Your health care provider will look at this sample under a microscope to check for bacteria and abnormal cells. A vaginal pH test may also be done.  TREATMENT  Bacterial vaginosis may be treated with antibiotic medicines. These may be given in the form of a pill or a vaginal cream. A second round of antibiotics may be prescribed if the condition comes back after treatment.  HOME CARE INSTRUCTIONS   Only take over-the-counter or prescription medicines as directed by your health care provider.  If antibiotic medicine was prescribed, take it as directed. Make sure you finish  it even if you start to feel better.  Do not have sex until treatment is completed.  Tell all sexual partners that you have a vaginal infection. They should see their health care provider and be treated if they have problems, such as a mild rash or itching.  Practice safe sex by using condoms and only having one sex partner. SEEK MEDICAL CARE IF:   Your symptoms are not improving after 3 days of treatment.  You have increased discharge or pain.  You have a fever. MAKE SURE YOU:   Understand these instructions.  Will watch your condition.  Will get help right  away if you are not doing well or get worse. FOR MORE INFORMATION  Centers for Disease Control and Prevention, Division of STD Prevention: AppraiserFraud.fi American Sexual Health Association (ASHA): www.ashastd.org  Document Released: 04/14/2005 Document Revised: 02/02/2013 Document Reviewed: 11/24/2012 [facility] Patient Information 2015 [place], [place]. This information is not intended to replace advice given to you by your health care provider. Make sure you discuss any questions you have with your health care provider.   Emergency Department Resource Guide 1) Find a Doctor and Pay Out of Pocket Although you won't have to find out who is covered by your insurance plan, it is a good idea to ask around and get recommendations. You will then need to call the office and see if the doctor you have chosen will accept you as a new patient and what types of options they offer for patients who are self-pay. Some doctors offer discounts or will set up payment plans for their patients who do not have insurance, but you will need to ask so you aren't surprised when you get to your appointment.  2) Contact Your Local Health Department Not all health departments have doctors that can see patients for sick visits, but many do, so it is worth a call to see if yours does. If you don't know where your local health department is, you can check in your phone book. The CDC also has a tool to help you locate your state's health department, and many state websites also have listings of all of their local health departments.  3) Find a [facility] If your illness is not likely to be very severe or complicated, you may want to try a walk in clinic. These are popping up all over the country in pharmacies, drugstores, and shopping centers. They're usually staffed by nurse practitioners or physician assistants that have been trained to treat common illnesses and complaints. They're usually fairly quick and inexpensive.  However, if you have serious medical issues or chronic medical problems, these are probably not your best option.  No Primary Care Doctor: - Call Health Connect at  [phone] - they can help you locate a primary care doctor that  accepts your insurance, provides certain services, etc. - Physician Referral Service- [phone]  Chronic Pain Problems: Organization         Address  Phone   Notes  [facility]  [phone] Patients need to be referred by their primary care doctor.   Medication Assistance: Organization         Address  Phone   Notes  [facility] Medication [facility] [place] [place], [place] [address] [phone] --Must be a resident of [facility] -- Must have NO insurance coverage whatsoever (no Medicaid/ Medicare, etc.) -- The pt. MUST have a primary care doctor that directs their care regularly  and follows them in the community   MedAssist  [phone]   [employer]  [phone]    Agencies that provide inexpensive medical care: Organization         Address  Phone   Notes  [place]  [phone]   [facility]    [phone]   [facility] [place], [place] [address] [phone]   [place]. [address] [place] [phone]   Planned Parenthood    [phone]   [facility]    [phone]   [place] and [address], [place] Phone:  [phone], Fax:  [phone] Hours of Operation:  9 am - 6 pm, M-F.  Also accepts Medicaid/Medicare and self-pay.  [facility] for [place], [address], [place] Phone: [phone], Fax: [phone]. Hours of Operation:  8:30 am - 5:30 pm, M-F.  Also accepts Medicaid and self-pay.  [facility] [place] [address], [place] Phone: [phone]   [place], [place], [place] [phone], Ext. [phone] Mondays & Thursdays: 7-9 AM.  First 15 patients are seen on a first come, first serve basis.    [place] Providers:  Organization         Address  Phone   Notes  [facility] [address], [place] [phone] Also accepts self-pay patients.  [facility] [address], [place]  [phone]   [place], [address], [place] [phone]   [facility] [address] [place] [phone]   Lucianne Lei [address], [place]   [phone] Only accepts [place] Access [place] patients after they have their name applied to their card.   Self-Pay (no insurance) in [facility]:  Organization         Address  Phone   Notes  Sickle Cell Patients, [facility] [place] [phone]   [facility] [place] [facility] [phone]   [facility] [place]  [place], [place],  [phone]   [facility]/Dr. Osei-Bonsu  [address] [place] or [address], [place] [phone] Phone number for both [place] and [place] locations is the same.  [facility] [address] [place] [phone]   [facility] [address], [place] or [address] [phone]   [facility] [address] [place] [phone], phone; [phone], fax Sees patients 1st and 3rd Saturday of every month.  Must not qualify for public or private insurance (i.e. Medicaid, Medicare, [place] Health Choice, Veterans' Benefits)  Household income should be no more than 200% of the poverty level The clinic cannot treat you if you are pregnant or think  you are pregnant  Sexually transmitted diseases are not treated at the clinic.    Dental Care: Organization         Address  Phone  Notes  [facility] [address] [place], [place] [phone] Accepts children up to age 90 who  are enrolled in Medicaid or [place] Health Choice; pregnant women with a Medicaid card; and children who have applied for Medicaid or [place] Health Choice, but were declined, whose parents can pay a reduced fee at time of service.  [facility] of [facility]  [address], [place] [phone] Accepts children up to age 57 who are enrolled in [place] or [place]; pregnant women with a Medicaid card; and children who have applied for Medicaid or [place] Health Choice, but were declined, whose parents can pay a reduced fee at time of service.  [place] Adult [facility] Access PROGRAM  [place] [phone] Patients are seen by appointment only. Walk-ins are not accepted. [place] will see patients 76 years of age and older. Monday - Tuesday (8am-5pm) Most Wednesdays (8:30-5pm) $30 per visit, cash only  [facility] Adult Dental Access PROGRAM  [address], [facility] [phone] Patients are seen by appointment only. Walk-ins are not accepted. [place] will see patients 51 years of age and older. One Wednesday Evening (Monthly: Volunteer Based).  $30 per visit, cash only  [place]  [phone] for adults; Children under age 22, call Graduate Pediatric Dentistry at [phone]. Children aged 24-14, please call [phone] to request a pediatric application.  Dental services are provided in all areas of dental care including fillings, crowns and bridges, complete and partial dentures, implants, gum treatment, root canals, and extractions. Preventive care is also provided. Treatment is provided to both adults and  children. Patients are selected via a lottery and there is often a waiting list.   [facility] [address] [place]  [phone] [email or web address]   [facility] [address] [place], [place] [phone], Ext. [phone] Second and Fourth Thursday of each month, opens at 6:30 AM; Clinic ends at 9 AM.  Patients are seen on a first-come first-served basis, and a limited number are seen during each clinic.   [facility]  [address] Hillard Danker [place], [place] [phone]   Eligibility Requirements You must have lived in [place], [place], or [place] counties for at least the last three months.   You cannot be eligible for state or federal sponsored [employer], including [employer], [place], or [employer].   You generally cannot be eligible for healthcare insurance through your employer.    How to apply: Eligibility screenings are held every Tuesday and Wednesday afternoon from 1:00 pm until 4:00 pm. You do not need an appointment for the interview!  [facility] [address] [place], [place]   [place]  [place] Department  [place]  [phone]    Behavioral Health Resources in the Community: Intensive Outpatient Programs Organization         Address  Phone  Notes  [place]. [address] [place], [place] [phone]   [facility] [address], [place], [place]   ADS: Alcohol & Drug Svcs [address] [name], [place]   [place] [address],  [place], [place] or [phone]   Substance Abuse Resources Organization         Address  Phone  Notes  Alcohol and Drug Services  [place]  [phone]  [facility] [place]     Chinita Pester  [phone]   Residential & Outpatient Substance Abuse Program  [phone]   Psychological Services Organization         Address  Phone  Notes  [facility]  [place]  [phone]   [place] [address], [place] or [phone]    Mobile Crisis Teams Organization         Address  Phone  Notes  [facility], Mobile Crisis Care Unit  [phone]   [facility]  [address] [place], [place]   [place] [address], [place] [phone]    Self-Help/Support Groups Organization         Address  Phone             Notes  [place]. of [place] - variety of support groups  [place] Call for more information  Narcotics Anonymous (NA), [facility] [address], [employer] [place]  2 meetings at this location   Special educational needs teacher         Address  Phone  Notes  ASAP Residential Treatment [place],    [place]  [phone]   [facility]  [address] [place] [id number], [place], [place]   [place], [place] [phone] Admissions: 8am-3pm M-F  Incentives Substance [place] [address]    [place], [place] [phone]   [facility] [address] [place], [place], [place]   [facility] [address]  [place], [place]   [facility] - Intensive Outpatient [address] Kristeen Mans [address], [place], [place]   [facility] [place].,  [place], [place] [phone] or [phone]   [facility] Services (RTS) [address] [place], [place] Accepts Medicaid  [facility] [place] [address],  [place] [phone] Substance Abuse/Addiction Treatment   [facility] Organization         Address  Phone  Notes  [facility]  [phone]   Domenic Schwab, PhD [address] Arlis Porta [place], [place]   [phone] or [phone]   [facility] [place], [place] [phone]   [facility] [address] [place], [place] [phone] Insurance/Medicaid/sponsorship through [facility] and Families [address] [place]                                    [place], [place] [phone] [place] [address], [place] [phone]    Dr. Adele Schilder  [phone]   [facility] Dept. 1) [address], [place] 2) [place] 3)  [place] [address], [place] [phone]  [phone]   [place] [phone] or [phone] (After Hours)

## 2014-08-29 NOTE — ED Notes (Signed)
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 Pt reports last use 1 week ago. Reports nausea. Pt also wants to be checked for STDs. denies any vaginal discharge/odor. Pt reports a "lump" on the left side of her neck as well.

## 2014-08-29 NOTE — ED Notes (Signed)
The pt has been to nurse first x4 times.  She is asking how long the wait.  She is eating chips and she has been outside to smoke once. She reports that she is still having chest pain.  No distress

## 2014-08-30 LAB — GC/CHLAMYDIA PROBE AMP (~~LOC~~) NOT AT ARMC
Chlamydia: NEGATIVE
Neisseria Gonorrhea: NEGATIVE

## 2014-08-30 LAB — HIV ANTIBODY (ROUTINE TESTING W REFLEX): HIV Screen 4th Generation wRfx: NONREACTIVE

## 2014-08-30 LAB — RPR: RPR Ser Ql: NONREACTIVE

## 2014-09-01 LAB — URINE CULTURE: Colony Count: >=100000

## 2014-09-02 ENCOUNTER — Telehealth: Payer: Self-pay | Admitting: Emergency Medicine

## 2014-09-02 NOTE — Telephone Encounter (Incomplete)
Post ED Visit - Positive Culture Follow-up  Culture report reviewed by antimicrobial stewardship pharmacist: []  Wes Dulaney, Pharm.D., BCPS []  Heide Guile, Pharm.D., BCPS []  Alycia Rossetti, Pharm.D., BCPS []  [place], Pharm.D., BCPS, AAHIVP []  Legrand [place]o, Pharm.D., BCPS, AAHIVP []  Sherlon Handing, Pharm.D., BCPS  Positive Urine  culture Treated with Nitrofurantoin, organism sensitive to the same and no further patient follow-up is required at this time.  Ernesta Amble 09/02/2014, 12:56 PM

## 2014-09-03 ENCOUNTER — Emergency Department (HOSPITAL_COMMUNITY)
Admission: EM | Admit: 2014-09-03 | Discharge: 2014-09-03 | Disposition: A | Discharge status: Home / Self Care | Payer: Medicaid Other | Attending: Emergency Medicine | Admitting: Emergency Medicine

## 2014-09-03 ENCOUNTER — Encounter (HOSPITAL_COMMUNITY): Payer: Self-pay | Admitting: Emergency Medicine

## 2014-09-03 ENCOUNTER — Emergency Department (HOSPITAL_COMMUNITY): Payer: Medicaid Other

## 2014-09-03 DIAGNOSIS — J45909 Unspecified asthma, uncomplicated: Secondary | ICD-10-CM | POA: Insufficient documentation

## 2014-09-03 DIAGNOSIS — Z720 Tobacco use: Secondary | ICD-10-CM | POA: Diagnosis not present

## 2014-09-03 DIAGNOSIS — Z79899 Other long term (current) drug therapy: Secondary | ICD-10-CM | POA: Insufficient documentation

## 2014-09-03 DIAGNOSIS — Z7951 Long term (current) use of inhaled steroids: Secondary | ICD-10-CM | POA: Diagnosis not present

## 2014-09-03 DIAGNOSIS — Z792 Long term (current) use of antibiotics: Secondary | ICD-10-CM | POA: Diagnosis not present

## 2014-09-03 DIAGNOSIS — M797 Fibromyalgia: Secondary | ICD-10-CM | POA: Diagnosis not present

## 2014-09-03 DIAGNOSIS — F209 Schizophrenia, unspecified: Secondary | ICD-10-CM | POA: Insufficient documentation

## 2014-09-03 DIAGNOSIS — R109 Unspecified abdominal pain: Secondary | ICD-10-CM

## 2014-09-03 DIAGNOSIS — R1031 Right lower quadrant pain: Secondary | ICD-10-CM | POA: Diagnosis present

## 2014-09-03 DIAGNOSIS — Z880 Allergy status to penicillin: Secondary | ICD-10-CM | POA: Diagnosis not present

## 2014-09-03 DIAGNOSIS — F319 Bipolar disorder, unspecified: Secondary | ICD-10-CM | POA: Diagnosis not present

## 2014-09-03 DIAGNOSIS — K5900 Constipation, unspecified: Secondary | ICD-10-CM | POA: Diagnosis not present

## 2014-09-03 LAB — URINALYSIS, ROUTINE W REFLEX MICROSCOPIC
Bilirubin Urine: NEGATIVE
Glucose, UA: NEGATIVE mg/dL
Hgb urine dipstick: NEGATIVE
Ketones, ur: NEGATIVE mg/dL
Leukocytes, UA: NEGATIVE
Nitrite: NEGATIVE
Protein, ur: NEGATIVE mg/dL
Specific Gravity, Urine: 1.01 (ref 1.005–1.030)
Urobilinogen, UA: 0.2 mg/dL (ref 0.0–1.0)
pH: 7 (ref 5.0–8.0)

## 2014-09-03 LAB — CBC WITH DIFFERENTIAL/PLATELET
Basophils Absolute: 0 10*3/uL (ref 0.0–0.1)
Basophils Relative: 0 % (ref 0–1)
Eosinophils Absolute: 0.8 10*3/uL — ABNORMAL HIGH (ref 0.0–0.7)
Eosinophils Relative: 13 % — ABNORMAL HIGH (ref 0–5)
HCT: 42.2 % (ref 36.0–46.0)
Hemoglobin: 12.9 g/dL (ref 12.0–15.0)
Lymphocytes Relative: 32 % (ref 12–46)
Lymphs Abs: 1.9 10*3/uL (ref 0.7–4.0)
MCH: 23.1 pg — ABNORMAL LOW (ref 26.0–34.0)
MCHC: 30.6 g/dL (ref 30.0–36.0)
MCV: 75.5 fL — ABNORMAL LOW (ref 78.0–100.0)
Monocytes Absolute: 0.3 10*3/uL (ref 0.1–1.0)
Monocytes Relative: 5 % (ref 3–12)
Neutro Abs: 2.9 10*3/uL (ref 1.7–7.7)
Neutrophils Relative %: 50 % (ref 43–77)
Platelets: 250 10*3/uL (ref 150–400)
RBC: 5.59 MIL/uL — ABNORMAL HIGH (ref 3.87–5.11)
RDW: 15.1 % (ref 11.5–15.5)
WBC: 5.9 10*3/uL (ref 4.0–10.5)

## 2014-09-03 LAB — COMPREHENSIVE METABOLIC PANEL
ALT: 36 U/L (ref 14–54)
AST: 41 U/L (ref 15–41)
Albumin: 4.1 g/dL (ref 3.5–5.0)
Alkaline Phosphatase: 60 U/L (ref 38–126)
Anion gap: 7 (ref 5–15)
BUN: 7 mg/dL (ref 6–20)
CO2: 24 mmol/L (ref 22–32)
Calcium: 10 mg/dL (ref 8.9–10.3)
Chloride: 103 mmol/L (ref 101–111)
Creatinine, Ser: 0.73 mg/dL (ref 0.44–1.00)
GFR calc Af Amer: >60 mL/min (ref >60)
GFR calc non Af Amer: >60 mL/min (ref >60)
Glucose, Bld: 92 mg/dL (ref 70–99)
Potassium: 4.2 mmol/L (ref 3.5–5.1)
Sodium: 134 mmol/L — ABNORMAL LOW (ref 135–145)
Total Bilirubin: 0.3 mg/dL (ref 0.3–1.2)
Total Protein: 8.3 g/dL — ABNORMAL HIGH (ref 6.5–8.1)

## 2014-09-03 LAB — LIPASE, BLOOD: Lipase: 30 U/L (ref 22–51)

## 2014-09-03 MED ORDER — IOHEXOL 300 MG/ML  SOLN: 25.0000 mL | Freq: Once | INTRAMUSCULAR | Status: DC | PRN

## 2014-09-03 MED ORDER — SODIUM CHLORIDE 0.9 % IV BOLUS (SEPSIS)
500.0000 mL | Freq: Once | INTRAVENOUS | Status: AC
  Administered 2014-09-03: 500 mL via INTRAVENOUS

## 2014-09-03 MED ORDER — SODIUM CHLORIDE 0.9 % IV SOLN
INTRAVENOUS | Status: DC
  Administered 2014-09-03: 13:00:00 via INTRAVENOUS

## 2014-09-03 MED ORDER — FENTANYL CITRATE (PF) 100 MCG/2ML IJ SOLN
50.0000 ug | Freq: Once | INTRAMUSCULAR | Status: AC
  Administered 2014-09-03: 50 ug via INTRAVENOUS
  Filled 2014-09-03: qty 2

## 2014-09-03 MED ORDER — DOCUSATE SODIUM 100 MG PO CAPS: 100.0000 mg | ORAL_CAPSULE | Freq: Two times a day (BID) | ORAL | Status: DC

## 2014-09-03 MED ORDER — DOCUSATE SODIUM 100 MG PO CAPS
100.0000 mg | ORAL_CAPSULE | Freq: Once | ORAL | Status: AC
  Administered 2014-09-03: 100 mg via ORAL
  Filled 2014-09-03: qty 1

## 2014-09-03 MED ORDER — ONDANSETRON HCL 4 MG/2ML IJ SOLN
4.0000 mg | Freq: Once | INTRAMUSCULAR | Status: AC
  Administered 2014-09-03: 4 mg via INTRAVENOUS
  Filled 2014-09-03: qty 2

## 2014-09-03 MED ORDER — IOHEXOL 300 MG/ML  SOLN
100.0000 mL | Freq: Once | INTRAMUSCULAR | Status: AC | PRN
  Administered 2014-09-03: 100 mL via INTRAVENOUS

## 2014-09-03 NOTE — ED Provider Notes (Signed)
CSN: [id number]     Arrival date & time 09/03/14  1121 History   First MD Initiated Contact with Patient 09/03/14 1124     Chief Complaint  Patient presents with  . Abdominal Pain     (Consider location/radiation/quality/duration/timing/severity/associated sxs/prior Treatment) Patient is a 58 y.o. female presenting with abdominal pain. The history is provided by the patient.  Abdominal Pain Associated symptoms: no chest pain, no diarrhea, no dysuria, no fever, no nausea, no shortness of breath and no vomiting    patient with complaint of right flank right lower quadrant abdominal pain patient not sure exactly when it started. Patient brought in from [place]. Patient had originally said in the note that was left lower quadrant abdominal pain radiating to the back patient pointed to the right lower quadrant for me. EMS reported been ongoing for a week. Patient stated she wasn't sure how long. No nausea or vomiting. Stated was having normal bowel movements. No urinary tract symptoms. No fever. Pain is reported to be 10 out of 10 the patient looks comfortable.  Past Medical History  Diagnosis Date  . Fibromyalgia   . Schizophrenia   . Asthma   . Bipolar 1 disorder   . Tobacco abuse    Past Surgical History  Procedure Laterality Date  . Cesarean section    . Tubal ligation     No family history on file. History  Substance Use Topics  . Smoking status: Current Every Day Smoker -- 0.50 packs/day    Types: Cigarettes  . Smokeless tobacco: Not on file  . Alcohol Use: Yes     Comment: occ   OB History    No data available     Review of Systems  Constitutional: Negative for fever.  HENT: Negative for congestion.   Eyes: Negative for redness.  Respiratory: Negative for shortness of breath.   Cardiovascular: Negative for chest pain.  Gastrointestinal: Positive for abdominal pain. Negative for nausea, vomiting and diarrhea.  Genitourinary: Positive for flank pain.  Negative for dysuria.  Musculoskeletal: Negative for back pain.  Skin: Negative for rash.  Neurological: Negative for headaches.  Hematological: Does not bruise/bleed easily.  Psychiatric/Behavioral: Negative for confusion.      Allergies  Penicillins  Home Medications   Prior to Admission medications   Medication Sig Start Date End Date Taking? Authorizing Provider  beclomethasone (QVAR) 40 MCG/ACT inhaler Inhale 2 puffs into the lungs 2 (two) times daily.   Yes Historical Provider, MD  benztropine (COGENTIN) 0.5 MG tablet Take 1 mg by mouth 2 (two) times daily.    Yes Historical Provider, MD  bisacodyl (DULCOLAX) 5 MG EC tablet Take 5 mg by mouth 2 (two) times daily.   Yes Historical Provider, MD  haloperidol (HALDOL) 1 MG tablet Take 1 mg by mouth 2 (two) times daily.    Yes Historical Provider, MD  ibuprofen (ADVIL,MOTRIN) 200 MG tablet Take 400 mg by mouth every 6 (six) hours as needed for moderate pain.   Yes Historical Provider, MD  lithium carbonate (ESKALITH) 450 MG CR tablet Take 450 mg by mouth 2 (two) times daily.   Yes Historical Provider, MD  metroNIDAZOLE (FLAGYL) 500 MG tablet Take 1 tablet (500 mg total) by mouth 2 (two) times daily. One po bid x 7 days 08/29/14  Yes Dahlia Bailiff, PA-C  paliperidone (INVEGA) 9 MG 24 hr tablet Take 9 mg by mouth every morning.   Yes Historical Provider, MD  pantoprazole (PROTONIX) 40 MG tablet Take  1 tablet (40 mg total) by mouth daily. 08/29/14  Yes Dahlia Bailiff, PA-C  cephALEXin (KEFLEX) 500 MG capsule Take 1 capsule (500 mg total) by mouth 2 (two) times daily. Patient not taking: Reported on 08/15/2014 07/30/14   Quintella Reichert, MD  ciprofloxacin (CIPRO) 500 MG tablet Take 1 tablet (500 mg total) by mouth 2 (two) times daily. 08/15/14   Tiffany Carlota Raspberry, PA-C  docusate sodium (COLACE) 100 MG capsule Take 1 capsule (100 mg total) by mouth every 12 (twelve) hours. 09/03/14   Fredia Sorrow, MD   BP 134/75 mmHg  Pulse 62  Temp(Src) 97.7 F (36.5 C)  (Oral)  Resp 18  SpO2 99% Physical Exam  Constitutional: She is oriented to person, place, and time. She appears well-developed and well-nourished. No distress.  HENT:  Head: Normocephalic and atraumatic.  Mouth/Throat: Oropharynx is clear and moist.  Eyes: Conjunctivae and EOM are normal. Pupils are equal, round, and reactive to light.  Neck: Normal range of motion. Neck supple.  Cardiovascular: Normal rate, regular rhythm and normal heart sounds.   No murmur heard. Pulmonary/Chest: Effort normal and breath sounds normal. No respiratory distress.  Abdominal: Soft. Bowel sounds are normal. There is no tenderness.  Musculoskeletal: Normal range of motion.  Neurological: She is alert and oriented to person, place, and time. No cranial nerve deficit. She exhibits normal muscle tone.  Skin: Skin is warm. No rash noted.  Nursing note and vitals reviewed.   ED Course  Procedures (including critical care time) Labs Review Labs Reviewed  CBC WITH DIFFERENTIAL/PLATELET - Abnormal; Notable for the following:    RBC 5.59 (*)    MCV 75.5 (*)    MCH 23.1 (*)    Eosinophils Relative 13 (*)    Eosinophils Absolute 0.8 (*)    All other components within normal limits  COMPREHENSIVE METABOLIC PANEL - Abnormal; Notable for the following:    Sodium 134 (*)    Total Protein 8.3 (*)    All other components within normal limits  URINALYSIS, ROUTINE W REFLEX MICROSCOPIC  LIPASE, BLOOD    Imaging Review Ct Abdomen Pelvis W Contrast  09/03/2014   CLINICAL DATA:  LEFT lower quadrant abdominal pain radiating to back for 1 week, pain rated 10/10, history fibromyalgia, asthma, smoking  EXAM: CT ABDOMEN AND PELVIS WITH CONTRAST  TECHNIQUE: Multidetector CT imaging of the abdomen and pelvis was performed using the standard protocol following bolus administration of intravenous contrast. Sagittal and coronal MPR images reconstructed from axial data set.  CONTRAST:  126mL OMNIPAQUE IOHEXOL 300 MG/ML SOLN IV.  Dilute oral contrast.  COMPARISON:  None  FINDINGS: Lung bases clear.  Dependent gallstones in gallbladder.  No gallbladder wall thickening or biliary dilatation by CT.  Liver, spleen, pancreas, kidneys, and adrenal glands normal appearance.  Diffusely increased stool throughout the colon.  Stomach and bowel loops otherwise unremarkable.  No mass, adenopathy, free air, or free fluid.  Bladder, ureters, uterus and adnexa unremarkable.  No hernia or acute osseous findings.  IMPRESSION: Diffusely increased stool throughout colon clinically consider constipation.  Cholelithiasis.  Otherwise negative exam.   Electronically Signed   By: Lavonia Dana M.D.   On: 09/03/2014 15:16     EKG Interpretation None      MDM   Final diagnoses:  Abdominal pain  Constipation, unspecified constipation type    Workup consistent with constipation. No other significant findings. No significant lab findings. Urinalysis is now negative for evidence of urinary tract infection. Patient will be discharged  on Colace.    Fredia Sorrow, MD 09/03/14 [phone]

## 2014-09-03 NOTE — ED Notes (Signed)
Per [facility], pt coming from [facility], c/o LLQ abdominal pain radiating to back 10/10, going on for a week. No nausea or vomiting, normal BMs, no issues with urination, pt ambulatory to room. No tenderness or guarding or masses per EMS. Pt is AAOX4, in NAD.

## 2014-09-03 NOTE — Discharge Instructions (Signed)
Urinary tract infection has resolved. CT scan of the abdomen shows constipation. Take the Colace as directed. Return for any new or worse symptoms.

## 2014-11-30 ENCOUNTER — Encounter (HOSPITAL_COMMUNITY): Payer: Self-pay | Admitting: *Deleted

## 2014-11-30 ENCOUNTER — Emergency Department (HOSPITAL_COMMUNITY)
Admission: EM | Admit: 2014-11-30 | Discharge: 2014-11-30 | Disposition: A | Discharge status: Home / Self Care | Payer: Medicaid Other | Attending: Emergency Medicine | Admitting: Emergency Medicine

## 2014-11-30 DIAGNOSIS — F319 Bipolar disorder, unspecified: Secondary | ICD-10-CM | POA: Insufficient documentation

## 2014-11-30 DIAGNOSIS — Y999 Unspecified external cause status: Secondary | ICD-10-CM | POA: Diagnosis not present

## 2014-11-30 DIAGNOSIS — F209 Schizophrenia, unspecified: Secondary | ICD-10-CM | POA: Diagnosis not present

## 2014-11-30 DIAGNOSIS — Y929 Unspecified place or not applicable: Secondary | ICD-10-CM | POA: Diagnosis not present

## 2014-11-30 DIAGNOSIS — Z792 Long term (current) use of antibiotics: Secondary | ICD-10-CM | POA: Insufficient documentation

## 2014-11-30 DIAGNOSIS — Z79899 Other long term (current) drug therapy: Secondary | ICD-10-CM | POA: Diagnosis not present

## 2014-11-30 DIAGNOSIS — Z041 Encounter for examination and observation following transport accident: Secondary | ICD-10-CM | POA: Insufficient documentation

## 2014-11-30 DIAGNOSIS — Z720 Tobacco use: Secondary | ICD-10-CM | POA: Insufficient documentation

## 2014-11-30 DIAGNOSIS — Z880 Allergy status to penicillin: Secondary | ICD-10-CM | POA: Insufficient documentation

## 2014-11-30 DIAGNOSIS — Y939 Activity, unspecified: Secondary | ICD-10-CM | POA: Diagnosis not present

## 2014-11-30 DIAGNOSIS — Z7951 Long term (current) use of inhaled steroids: Secondary | ICD-10-CM | POA: Insufficient documentation

## 2014-11-30 DIAGNOSIS — J45909 Unspecified asthma, uncomplicated: Secondary | ICD-10-CM | POA: Insufficient documentation

## 2014-11-30 MED ORDER — NAPROXEN 500 MG PO TABS: 500.0000 mg | ORAL_TABLET | Freq: Two times a day (BID) | ORAL | Status: DC
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
## 2014-11-30 NOTE — Discharge Instructions (Signed)
Take the prescribed medication as directed. [facility]. Return to the ED for new or worsening symptoms.

## 2014-12-06 ENCOUNTER — Ambulatory Visit: Payer: Self-pay | Admitting: Family Medicine

## 2015-01-05 ENCOUNTER — Encounter (HOSPITAL_COMMUNITY): Payer: Self-pay | Admitting: *Deleted

## 2015-01-05 ENCOUNTER — Emergency Department (HOSPITAL_COMMUNITY)
Admission: EM | Admit: 2015-01-05 | Discharge: 2015-01-05 | Disposition: A | Discharge status: Home / Self Care | Payer: Medicaid Other | Attending: Emergency Medicine | Admitting: Emergency Medicine

## 2015-01-05 DIAGNOSIS — Z720 Tobacco use: Secondary | ICD-10-CM | POA: Insufficient documentation

## 2015-01-05 DIAGNOSIS — J45909 Unspecified asthma, uncomplicated: Secondary | ICD-10-CM | POA: Diagnosis not present

## 2015-01-05 DIAGNOSIS — Z7951 Long term (current) use of inhaled steroids: Secondary | ICD-10-CM | POA: Insufficient documentation

## 2015-01-05 DIAGNOSIS — Z79899 Other long term (current) drug therapy: Secondary | ICD-10-CM | POA: Diagnosis not present

## 2015-01-05 DIAGNOSIS — Z880 Allergy status to penicillin: Secondary | ICD-10-CM | POA: Diagnosis not present

## 2015-01-05 DIAGNOSIS — F209 Schizophrenia, unspecified: Secondary | ICD-10-CM | POA: Diagnosis not present

## 2015-01-05 DIAGNOSIS — M542 Cervicalgia: Secondary | ICD-10-CM | POA: Diagnosis not present

## 2015-01-05 DIAGNOSIS — Z792 Long term (current) use of antibiotics: Secondary | ICD-10-CM | POA: Insufficient documentation

## 2015-01-05 DIAGNOSIS — R6889 Other general symptoms and signs: Secondary | ICD-10-CM

## 2015-01-05 MED ORDER — IBUPROFEN 200 MG PO TABS: 400.0000 mg | ORAL_TABLET | Freq: Four times a day (QID) | ORAL | Status: DC | PRN

## 2015-01-05 NOTE — ED Notes (Signed)
Declined W/C at D/C and was escorted to lobby by RN. 

## 2015-01-05 NOTE — ED Notes (Signed)
Pt point to a raised area of skin on the Left side of neck . It is reported the skin lump has been there a long time. Pt reports more lumps have appeared. Pt reports her PCP called her at home yesterday and told her to come to the ED to get the lumps looked at.

## 2015-01-05 NOTE — Discharge Instructions (Signed)
Please follow up with your doctor for further evaluation of your neck discomfort.     Emergency Department Resource Guide 1) Find a Doctor and Pay Out of Pocket Although you won't have to find out who is covered by your insurance plan, it is a good idea to ask around and get recommendations. You will then need to call the office and see if the doctor you have chosen will accept you as a new patient and what types of options they offer for patients who are self-pay. Some doctors offer discounts or will set up payment plans for their patients who do not have insurance, but you will need to ask so you aren't surprised when you get to your appointment.  2) Contact Your Local Health Department Not all health departments have doctors that can see patients for sick visits, but many do, so it is worth a call to see if yours does. If you don't know where your local health department is, you can check in your phone book. The CDC also has a tool to help you locate your state's health department, and many state websites also have listings of all of their local health departments.  3) Find a [facility] If your illness is not likely to be very severe or complicated, you may want to try a walk in clinic. These are popping up all over the country in pharmacies, drugstores, and shopping centers. They're usually staffed by nurse practitioners or physician assistants that have been trained to treat common illnesses and complaints. They're usually fairly quick and inexpensive. However, if you have serious medical issues or chronic medical problems, these are probably not your best option.  No Primary Care Doctor: - Call Health Connect at  [phone] - they can help you locate a primary care doctor that  accepts your insurance, provides certain services, etc. - Physician Referral Service- [phone]  Chronic Pain Problems: Organization         Address  Phone   Notes  [facility]  [phone] Patients need to be referred by their primary care doctor.   Medication Assistance: Organization         Address  Phone   Notes  [facility] Medication [facility] [address] [place] [place], [place] [address] [phone] --Must be a resident of [facility] -- Must have NO insurance coverage whatsoever (no Medicaid/ Medicare, etc.) -- The pt. MUST have a primary care doctor that directs their care regularly and follows them in the community   Med[facility]ist  [phone]   [employer]  [phone]    Agencies that provide inexpensive medical care: Organization         Address  Phone   Notes  [place]  [phone]   [facility]    [phone]   [facility] [place], [place] [address] [phone]   [place] [address], [place] [phone]   Planned Parenthood    [phone]   [facility]    [phone]   [place] and [place] [address], [place] Phone:  [phone], Fax:  [phone] Hours of Operation:  9 am - 6 pm, M-F.  Also accepts Medicaid/Medicare and self-pay.  [facility] for [place], [address], [place] Phone: [phone], Fax: [phone]. Hours of Operation:  8:30 am - 5:30 pm, M-F.  Also accepts Medicaid and self-pay.  [facility] [place] [address] [place] Phone: [phone]   [place], [place], [place] [phone], Ext. [phone] Mondays & Thursdays: 7-9 AM.  First 15 patients are seen on a first come, first serve basis.    [place] Providers:  Organization         Address  Phone   Notes  [facility] [address], [place] [phone] Also accepts self-pay patients.  [facility] [address], [place]   [phone]   [place], [address], [place] [phone]   [facility] [address] [place] [phone]   Lucianne Lei [address], [place]   [phone] Only accepts [place] Access [place] patients after they have their name applied to their card.   Self-Pay (no insurance) in [facility]:  Organization         Address  Phone   Notes  Sickle Cell Patients, [facility] [place] [phone]   [facility] [place] [phone]   [facility] [place]  [place], [place], [place] [phone]   [facility]/Dr. Osei-Bonsu  [address], [place] or [address], [place] [phone] Phone number for both [place] and [place] locations is the same.  [facility] [address] [place] [phone]   [facility] [address] [place] or [address] [phone]   [facility] [address] [place] [phone], phone; [phone], fax Sees patients 1st and 3rd Saturday of every month.  Must not qualify for public or private insurance (i.e. Medicaid, Medicare, [place] Health Choice, Veterans' Benefits)  Household income should be no more than 200% of the poverty level The clinic cannot treat you if you are pregnant or think you are pregnant  Sexually transmitted diseases are not treated at the clinic.    Dental Care: Organization         Address  Phone  Notes  [facility] Department of [facility] [place] [phone] Accepts children up to age 48 who are enrolled in [place] or [place]; pregnant women with a Medicaid card; and children who have applied for Medicaid or [place] Health Choice, but  were declined, whose parents can pay a reduced fee at time of service.  [facility] Department of [facility]  [address], [place] [phone] Accepts children up to age 11 who are enrolled in [place] or [place]; pregnant women with a Medicaid card; and children who have applied for Medicaid or  Health Choice, but were declined, whose parents can pay a reduced fee at time of service.  [facility]  [place] [phone] Patients are seen by appointment only. Walk-ins are not accepted. [place] will see patients 20 years of age and older. Monday - Tuesday (8am-5pm) Most Wednesdays (8:30-5pm) $30 per visit, cash only  [facility] Adult Dental Access PROGRAM  [address], [facility] [phone] Patients are seen by appointment  only. Walk-ins are not accepted. [place] will see patients 57 years of age and older. One Wednesday Evening (Monthly: Volunteer Based).  $30 per visit, cash only  [place]  [phone] for adults; Children under age 3, call Graduate Pediatric Dentistry at [phone]. Children aged 5-14, please call [phone] to request a pediatric application.  Dental services are provided in all areas of dental care including fillings, crowns and bridges, complete and partial dentures, implants, gum treatment, root canals, and extractions. Preventive care is also provided. Treatment is provided to both adults and children. Patients are selected via a lottery and there is often a waiting list.   [facility] [address], [place]  [phone] [email or web address]   [facility] [address] [place], [place] [phone], Ext. [phone] Second and Fourth Thursday of each month, opens at 6:30 AM; Clinic ends at 9 AM.  Patients are seen on a first-come first-served basis, and a limited number are seen during each clinic.    [facility]  [address] [place] Danker [place], [place] [phone]   Eligibility Requirements You must have lived in [place], [place], or [place] counties for at least the last three months.   You cannot be eligible for state or federal sponsored [employer], including [employer], [place], or [employer].   You generally cannot be eligible for healthcare insurance through your employer.    How to apply: Eligibility screenings are held every Tuesday and Wednesday afternoon from 1:00 pm until 4:00 pm. You do not need an appointment for the interview!  [facility] [address], [place], [place]   [place]  [place] Department  [place]  [phone]    Behavioral Health Resources in the Community: Intensive Outpatient Programs Organization         Address  Phone  Notes  [place]. [address], [place], [place] [phone]   [facility] Outpatient [address] [place], [place]   ADS: Alcohol & Drug Svcs [address] [place], [place]   [place] [address]  [place], [place] or [phone]   Substance Abuse Resources Organization         Address  Phone  Notes  Alcohol and Drug Services  [phone]   [place]  [phone]   The [place]   Chinita Pester  [phone]   Residential & Outpatient Substance Abuse Program  [phone]   Psychological Services Organization         Address  Phone  Notes  [facility]  [place]  [phone]   [place] [address], [place] or [phone]    Mobile Crisis Teams Organization         Address  Phone  Notes  [facility], Mobile Crisis Care  Unit  [phone]   [facility]  [address] [place], [place]   Bas[place] Levels [address] [place] [phone]    Self-Help/Support Groups Organization         Address  Phone             Notes  [place]. of [place] - variety of support groups  [place] Call for more information  Narcotics Anonymous (NA), [facility] [address], [employer] [place]  2 meetings at this location   Special educational needs teacher         Address  Phone  Notes  [facility] [place],    [place]  [phone]   [facility]  [address] [place] [address], [place], [place]   [place], [place] [phone] Admissions: 8am-3pm M-F  Incentives Substance [name] [address]    [place], [place] [phone]   [facility] [address] [place], [place], [place]   [facility] [address],  [place], [place]   [facility] - Intensive Outpatient [address] Kristeen [address], [place], [place]   [facility] ([address] [place].,  [place], [place] [phone] or [phone]   [facility] Services (RTS) [address] [place], [place] Accepts Medicaid  [facility] [place] [address],  [place] [phone] Substance Abuse/Addiction Treatment   [facility] Organization         Address  Phone  Notes  [facility]  [phone]   Domenic Schwab, PhD [address] [place], [place]   [phone] or [phone]   [facility], [place] [phone]   [facility] [address], [place], [place] [phone] Insurance/Medicaid/sponsorship through [facility] and Families [address] [place]                                     [place], [place] [phone] [place] [address], [place] [phone]    Dr. Adele Schilder  [phone]   [facility] Dept. 1) [address], [place] 2) [place] 3)  [address], [place] [phone]  [phone]   [place] [phone] or [phone] (After Hours)

## 2015-01-05 NOTE — ED Provider Notes (Signed)
CSN: [id number]     Arrival date & time 01/05/15  9357 History   First MD Initiated Contact with Patient 01/05/15 [phone]     Chief Complaint  Patient presents with  . Rash     (Consider location/radiation/quality/duration/timing/severity/associated sxs/prior Treatment) HPI   59 year old female with history of bipolar, schizophrenia, asthma, fibromyalgia presents for evaluation of neck discomfort. Patient is a poor historian. She mentioned that she has noticed "a lump on my neck" for a long time. Patient feels like the more lump appearing around her neck and upper chest. She decided to come here for further evaluation. She denies any pain with the associated "lump". She denies having fever, chills, headache, lightheadedness, dizziness, sore throat, trouble swallowing, neck stiffness, chest pain, heart palpitation, shortness of breath, numbness or weakness. No alleviating or aggravating factors. No specific treatment tried. She does have a PCP.  Past Medical History  Diagnosis Date  . Fibromyalgia   . Schizophrenia   . Asthma   . Bipolar 1 disorder   . Tobacco abuse    Past Surgical History  Procedure Laterality Date  . Cesarean section    . Tubal ligation     History reviewed. No pertinent family history. Social History  Substance Use Topics  . Smoking status: Current Every Day Smoker -- 0.50 packs/day    Types: Cigarettes  . Smokeless tobacco: None  . Alcohol Use: Yes     Comment: occ   OB History    No data available     Review of Systems  All other systems reviewed and are negative.     Allergies  Penicillins  Home Medications   Prior to Admission medications   Medication Sig Start Date End Date Taking? Authorizing Provider  beclomethasone (QVAR) 40 MCG/ACT inhaler Inhale 2 puffs into the lungs 2 (two) times daily.    Historical Provider, MD  benztropine (COGENTIN) 0.5 MG tablet Take 1 mg by mouth 2 (two) times daily.     Historical Provider, MD  bisacodyl  (DULCOLAX) 5 MG EC tablet Take 5 mg by mouth 2 (two) times daily.    Historical Provider, MD  cephALEXin (KEFLEX) 500 MG capsule Take 1 capsule (500 mg total) by mouth 2 (two) times daily. Patient not taking: Reported on 08/15/2014 07/30/14   Quintella Reichert, MD  ciprofloxacin (CIPRO) 500 MG tablet Take 1 tablet (500 mg total) by mouth 2 (two) times daily. 08/15/14   Tiffany Carlota Raspberry, PA-C  docusate sodium (COLACE) 100 MG capsule Take 1 capsule (100 mg total) by mouth every 12 (twelve) hours. 09/03/14   Fredia Sorrow, MD  haloperidol (HALDOL) 1 MG tablet Take 1 mg by mouth 2 (two) times daily.     Historical Provider, MD  ibuprofen (ADVIL,MOTRIN) 200 MG tablet Take 400 mg by mouth every 6 (six) hours as needed for moderate pain.    Historical Provider, MD  lithium carbonate (ESKALITH) 450 MG CR tablet Take 450 mg by mouth 2 (two) times daily.    Historical Provider, MD  metroNIDAZOLE (FLAGYL) 500 MG tablet Take 1 tablet (500 mg total) by mouth 2 (two) times daily. One po bid x 7 days 08/29/14   Dahlia Bailiff, PA-C  naproxen (NAPROSYN) 500 MG tablet Take 1 tablet (500 mg total) by mouth 2 (two) times daily with a meal. 11/30/14   Larene Pickett, PA-C  paliperidone (INVEGA) 9 MG 24 hr tablet Take 9 mg by mouth every morning.    Historical Provider, MD  pantoprazole (PROTONIX) 40 MG  tablet Take 1 tablet (40 mg total) by mouth daily. 08/29/14   Dahlia Bailiff, PA-C   BP 129/73 mmHg  Pulse 63  Temp(Src) 97.4 F (36.3 C) (Oral)  Resp 16  SpO2 100% Physical Exam  Constitutional: She is oriented to person, place, and time. She appears well-developed and well-nourished. No distress.  Frail-appearing African-American female in no acute distress  HENT:  Head: Atraumatic.  She is edentulous, throat exam shown uvula midline no tonsillar enlargement or exudate and no trismus.  Eyes: Conjunctivae are normal.  Neck: Neck supple.  Mild evidence of thyromegaly but no tracheal deviation, stridor or JVD. A small mobile  subcutaneous nodule noted along her left neck following neck vein that is non-tender and no surrounding skin erythema  Cardiovascular: Normal rate and regular rhythm.   Pulmonary/Chest: Effort normal and breath sounds normal.  Lymphadenopathy:    She has no cervical adenopathy.  Neurological: She is alert and oriented to person, place, and time.  Skin: No rash noted.  Psychiatric: She has a normal mood and affect.  Nursing note and vitals reviewed.   ED Course  Procedures (including critical care time)  Patient report having a lump on her neck. She does have a small subcutaneous mobile nodule to the left side of neck that does not appears to be infected and does not appear cancerous. Nodule is not consistence with lymphadenopathy. Evidence of mild thyromegaly but patient has stable vital signs and no active chest pain or signs of peripheral edema concern for thyroid etiology. No evidence of infection on exam. I encouraged patient to follow-up with her PCP for further evaluation. Otherwise she is stable for discharge. No rash.      MDM   Final diagnoses:  Neck complaint    BP 129/73 mmHg  Pulse 63  Temp(Src) 97.4 F (36.3 C) (Oral)  Resp 16  SpO2 100%     Domenic Moras, PA-C 01/05/15 1517  Leo Grosser, MD 01/06/15 1510

## 2015-04-17 ENCOUNTER — Emergency Department (HOSPITAL_COMMUNITY)
Admission: EM | Admit: 2015-04-17 | Discharge: 2015-04-17 | Disposition: A | Discharge status: Home / Self Care | Payer: Medicaid Other | Attending: Emergency Medicine | Admitting: Emergency Medicine

## 2015-04-17 ENCOUNTER — Encounter (HOSPITAL_COMMUNITY): Payer: Self-pay | Admitting: *Deleted

## 2015-04-17 ENCOUNTER — Emergency Department (HOSPITAL_COMMUNITY): Payer: Medicaid Other

## 2015-04-17 DIAGNOSIS — Y998 Other external cause status: Secondary | ICD-10-CM | POA: Diagnosis not present

## 2015-04-17 DIAGNOSIS — F17210 Nicotine dependence, cigarettes, uncomplicated: Secondary | ICD-10-CM | POA: Diagnosis not present

## 2015-04-17 DIAGNOSIS — Y9389 Activity, other specified: Secondary | ICD-10-CM | POA: Insufficient documentation

## 2015-04-17 DIAGNOSIS — F209 Schizophrenia, unspecified: Secondary | ICD-10-CM | POA: Diagnosis not present

## 2015-04-17 DIAGNOSIS — J45909 Unspecified asthma, uncomplicated: Secondary | ICD-10-CM | POA: Diagnosis not present

## 2015-04-17 DIAGNOSIS — F319 Bipolar disorder, unspecified: Secondary | ICD-10-CM | POA: Insufficient documentation

## 2015-04-17 DIAGNOSIS — M79671 Pain in right foot: Secondary | ICD-10-CM

## 2015-04-17 DIAGNOSIS — M797 Fibromyalgia: Secondary | ICD-10-CM | POA: Diagnosis not present

## 2015-04-17 DIAGNOSIS — Z880 Allergy status to penicillin: Secondary | ICD-10-CM | POA: Insufficient documentation

## 2015-04-17 DIAGNOSIS — S99921A Unspecified injury of right foot, initial encounter: Secondary | ICD-10-CM | POA: Insufficient documentation

## 2015-04-17 DIAGNOSIS — Z792 Long term (current) use of antibiotics: Secondary | ICD-10-CM | POA: Diagnosis not present

## 2015-04-17 DIAGNOSIS — Y9289 Other specified places as the place of occurrence of the external cause: Secondary | ICD-10-CM | POA: Insufficient documentation

## 2015-04-17 DIAGNOSIS — Z79899 Other long term (current) drug therapy: Secondary | ICD-10-CM | POA: Insufficient documentation

## 2015-04-17 DIAGNOSIS — X58XXXA Exposure to other specified factors, initial encounter: Secondary | ICD-10-CM | POA: Diagnosis not present

## 2015-04-17 MED ORDER — CEFTRIAXONE SODIUM 2 G IJ SOLR: 2.0000 g | Freq: Once | INTRAMUSCULAR | Status: DC

## 2015-04-17 NOTE — ED Provider Notes (Signed)
CSN: [id number]     Arrival date & time 04/17/15  0818 History   First MD Initiated Contact with Patient 04/17/15 [phone]     Chief Complaint  Patient presents with  . Foot Injury     (Consider location/radiation/quality/duration/timing/severity/associated sxs/prior Treatment) HPI Comments: Patient presents with complaint of right foot pain. Patient states that she twisted it while sleeping 2 days ago. She complains of pain in the foot and the ankle. No knee pain or hip pain. She is ambulatory but with pain. No treatments prior to arrival. No numbness or tingling. Onset of symptoms acute. Course is constant.  The history is provided by the patient.    Past Medical History  Diagnosis Date  . Fibromyalgia   . Schizophrenia ([place])   . Asthma   . Bipolar 1 disorder ([place])   . Tobacco abuse    Past Surgical History  Procedure Laterality Date  . Cesarean section    . Tubal ligation     History reviewed. No pertinent family history. Social History  Substance Use Topics  . Smoking status: Current Every Day Smoker -- 0.50 packs/day    Types: Cigarettes  . Smokeless tobacco: None  . Alcohol Use: Yes     Comment: occ   OB History    No data available     Review of Systems  Constitutional: Negative for activity change.  Musculoskeletal: Positive for myalgias and arthralgias. Negative for back pain, joint swelling, gait problem and neck pain.  Skin: Negative for wound.  Neurological: Negative for weakness and numbness.   Allergies  Penicillins  Home Medications   Prior to Admission medications   Medication Sig Start Date End Date Taking? Authorizing Provider  beclomethasone (QVAR) 40 MCG/ACT inhaler Inhale 2 puffs into the lungs 2 (two) times daily.    Historical Provider, MD  benztropine (COGENTIN) 0.5 MG tablet Take 1 mg by mouth 2 (two) times daily.     Historical Provider, MD  bisacodyl (DULCOLAX) 5 MG EC tablet Take 5 mg by mouth 2 (two) times daily.    Historical  Provider, MD  cephALEXin (KEFLEX) 500 MG capsule Take 1 capsule (500 mg total) by mouth 2 (two) times daily. Patient not taking: Reported on 08/15/2014 07/30/14   Quintella Reichert, MD  ciprofloxacin (CIPRO) 500 MG tablet Take 1 tablet (500 mg total) by mouth 2 (two) times daily. 08/15/14   Tiffany Carlota Raspberry, PA-C  docusate sodium (COLACE) 100 MG capsule Take 1 capsule (100 mg total) by mouth every 12 (twelve) hours. 09/03/14   Fredia Sorrow, MD  haloperidol (HALDOL) 1 MG tablet Take 1 mg by mouth 2 (two) times daily.     Historical Provider, MD  ibuprofen (ADVIL,MOTRIN) 200 MG tablet Take 2 tablets (400 mg total) by mouth every 6 (six) hours as needed for moderate pain. 01/05/15   Domenic Moras, PA-C  lithium carbonate (ESKALITH) 450 MG CR tablet Take 450 mg by mouth 2 (two) times daily.    Historical Provider, MD  metroNIDAZOLE (FLAGYL) 500 MG tablet Take 1 tablet (500 mg total) by mouth 2 (two) times daily. One po bid x 7 days 08/29/14   Dahlia Bailiff, PA-C  naproxen (NAPROSYN) 500 MG tablet Take 1 tablet (500 mg total) by mouth 2 (two) times daily with a meal. 11/30/14   Larene Pickett, PA-C  paliperidone (INVEGA) 9 MG 24 hr tablet Take 9 mg by mouth every morning.    Historical Provider, MD  pantoprazole (PROTONIX) 40 MG tablet Take 1 tablet (  40 mg total) by mouth daily. 08/29/14   Dahlia Bailiff, PA-C   BP 126/75 mmHg  Pulse 68  Temp(Src) 98.2 F (36.8 C) (Oral)  Resp 16  Ht 5\' 5"  (1.651 m)  Wt 52.164 kg  BMI 19.14 kg/m2  SpO2 99%   Physical Exam  Constitutional: She appears well-developed and well-nourished.  HENT:  Head: Normocephalic and atraumatic.  Eyes: Pupils are equal, round, and reactive to light.  Neck: Normal range of motion. Neck supple.  Cardiovascular: Exam reveals no decreased pulses.   Musculoskeletal: She exhibits tenderness. She exhibits no edema.       Right knee: Normal.       Right ankle: She exhibits normal range of motion and no swelling. Tenderness. Lateral malleolus tenderness found.  Achilles tendon normal.       Right lower leg: Normal.       Right foot: There is decreased range of motion and tenderness. There is no bony tenderness and no swelling.       Feet:  Neurological: She is alert. No sensory deficit.  Motor, sensation, and vascular distal to the injury is fully intact.   Skin: Skin is warm and dry.  Psychiatric: She has a normal mood and affect.  Nursing note and vitals reviewed.   ED Course  Procedures (including critical care time) Labs Review Labs Reviewed - No data to display  Imaging Review No results found. I have personally reviewed and evaluated these images and lab results as part of my medical decision-making.   EKG Interpretation None       8:58 AM Patient seen and examined. Work-up initiated.   Vital signs reviewed and are as follows: BP 126/75 mmHg  Pulse 68  Temp(Src) 98.2 F (36.8 C) (Oral)  Resp 16  Ht 5\' 5"  (1.651 m)  Wt 52.164 kg  BMI 19.14 kg/m2  SpO2 99%  9:27 AM imaging negative. Treat with Tylenol, rice protocol, postop shoe.  MDM   Final diagnoses:  Right foot pain   Patient with right foot and ankle injury. Imaging negative. Will treat as sprain/contusion. No joint infection suspected given normal movements. No signs of redness or warmth.    Carlisle Cater, PA-C 04/17/15 [id number]  Noemi Chapel, MD 04/18/15 [phone]

## 2015-04-17 NOTE — ED Notes (Signed)
Patient transported to X-ray 

## 2015-04-17 NOTE — Discharge Instructions (Signed)
Please read and follow all provided instructions.  Your diagnoses today include:  1. Right foot pain     Tests performed today include:  An x-ray of your ankle - does NOT show any broken bones  Vital signs. See below for your results today.   Medications prescribed:   Ibuprofen (Motrin, Advil) - anti-inflammatory pain medication  Do not exceed 600mg  ibuprofen every 6 hours, take with food  You have been prescribed an anti-inflammatory medication or NSAID. Take with food. Take smallest effective dose for the shortest duration needed for your pain. Stop taking if you experience stomach pain or vomiting.   Take any prescribed medications only as directed.  Home care instructions:   Follow any educational materials contained in this packet  Follow R.I.C.E. Protocol:  R - rest your injury   I  - use ice on injury without applying directly to skin  C - compress injury with bandage or splint  E - elevate the injury as much as possible  Follow-up instructions: Please follow-up with your primary care provider if you continue to have significant pain or trouble walking in 1 week. In this case you may have a severe sprain that requires further care.   Return instructions:   Please return if your toes are numb or tingling, appear gray or blue, or you have severe pain (also elevate leg and loosen splint or wrap)  Please return to the Emergency Department if you experience worsening symptoms.   Please return if you have any other emergent concerns.  Additional Information:  Your vital signs today were: BP 130/78 mmHg   Pulse 69   Temp(Src) 98.2 F (36.8 C) (Oral)   Resp 16   Ht 5\' 5"  (1.651 m)   Wt 52.164 kg   BMI 19.14 kg/m2   SpO2 100% If your blood pressure (BP) was elevated above 135/85 this visit, please have this repeated by your doctor within one month. -------------- Your caregiver has diagnosed you as suffering from an ankle sprain. Ankle sprain occurs when the  ligaments that hold the ankle joint together are stretched or torn. It may take 4 to 6 weeks to heal.

## 2015-04-17 NOTE — ED Notes (Signed)
Pt states she twisted her right foot 2 days ago when getting out of bed.  Pt foot has slight swelling to top of foot.  No other medical problems noted.

## 2015-04-17 NOTE — ED Notes (Signed)
Pt returned from xray

## 2015-04-23 ENCOUNTER — Encounter (HOSPITAL_COMMUNITY): Payer: Self-pay | Admitting: Vascular Surgery

## 2015-04-23 ENCOUNTER — Emergency Department (HOSPITAL_COMMUNITY)
Admission: EM | Admit: 2015-04-23 | Discharge: 2015-04-23 | Disposition: A | Discharge status: Home / Self Care | Payer: Medicaid Other | Attending: Emergency Medicine | Admitting: Emergency Medicine

## 2015-04-23 DIAGNOSIS — F209 Schizophrenia, unspecified: Secondary | ICD-10-CM | POA: Diagnosis not present

## 2015-04-23 DIAGNOSIS — Z880 Allergy status to penicillin: Secondary | ICD-10-CM | POA: Diagnosis not present

## 2015-04-23 DIAGNOSIS — F319 Bipolar disorder, unspecified: Secondary | ICD-10-CM | POA: Insufficient documentation

## 2015-04-23 DIAGNOSIS — M79671 Pain in right foot: Secondary | ICD-10-CM | POA: Diagnosis not present

## 2015-04-23 DIAGNOSIS — Z792 Long term (current) use of antibiotics: Secondary | ICD-10-CM | POA: Diagnosis not present

## 2015-04-23 DIAGNOSIS — Z79899 Other long term (current) drug therapy: Secondary | ICD-10-CM | POA: Diagnosis not present

## 2015-04-23 DIAGNOSIS — Z791 Long term (current) use of non-steroidal anti-inflammatories (NSAID): Secondary | ICD-10-CM | POA: Diagnosis not present

## 2015-04-23 DIAGNOSIS — J45909 Unspecified asthma, uncomplicated: Secondary | ICD-10-CM | POA: Insufficient documentation

## 2015-04-23 DIAGNOSIS — Z7951 Long term (current) use of inhaled steroids: Secondary | ICD-10-CM | POA: Insufficient documentation

## 2015-04-23 DIAGNOSIS — F17210 Nicotine dependence, cigarettes, uncomplicated: Secondary | ICD-10-CM | POA: Insufficient documentation

## 2015-04-23 NOTE — Discharge Instructions (Signed)
Read the information below.  You may return to the Emergency Department at any time for worsening condition or any new symptoms that concern you.   Emergency Department Resource Guide 1) Find a Doctor and Pay Out of Pocket Although you won't have to find out who is covered by your insurance plan, it is a good idea to ask around and get recommendations. You will then need to call the office and see if the doctor you have chosen will accept you as a new patient and what types of options they offer for patients who are self-pay. Some doctors offer discounts or will set up payment plans for their patients who do not have insurance, but you will need to ask so you aren't surprised when you get to your appointment.  2) Contact Your Local Health Department Not all health departments have doctors that can see patients for sick visits, but many do, so it is worth a call to see if yours does. If you don't know where your local health department is, you can check in your phone book. The CDC also has a tool to help you locate your state's health department, and many state websites also have listings of all of their local health departments.  3) Find a [facility] If your illness is not likely to be very severe or complicated, you may want to try a walk in clinic. These are popping up all over the country in pharmacies, drugstores, and shopping centers. They're usually staffed by nurse practitioners or physician assistants that have been trained to treat common illnesses and complaints. They're usually fairly quick and inexpensive. However, if you have serious medical issues or chronic medical problems, these are probably not your best option.  No Primary Care Doctor: - Call Health Connect at  [phone] - they can help you locate a primary care doctor that  accepts your insurance, provides certain services, etc. - Physician Referral Service- [phone]  Chronic Pain Problems: Organization          Address  Phone   Notes  [facility]  [phone] Patients need to be referred by their primary care doctor.   Medication Assistance: Organization         Address  Phone   Notes  [facility] Medication [facility] [address] [place], [place] [address] [phone] --Must be a resident of [facility] -- Must have NO insurance coverage whatsoever (no Medicaid/ Medicare, etc.) -- The pt. MUST have a primary care doctor that directs their care regularly and follows them in the community   MedAssist  [phone]   [employer]  [phone]    Agencies that provide inexpensive medical care: Organization         Address  Phone   Notes  [place]  [phone]   [facility]    [phone]   [facility] [place],  [phone]   [place] [address], [place] [phone]   Planned Parenthood    [phone]   [facility]    [phone]   [place] and [address], [place] Phone:  [phone], Fax:  [phone] Hours of Operation:  9 am - 6 pm, M-F.  Also accepts Medicaid/Medicare and self-pay.  [facility] for [place] [address], [address], [place]  Phone: [phone], Fax: [phone]. Hours of Operation:  8:30 am - 5:30 pm, M-F.  Also accepts Medicaid and self-pay.  [facility] [address], [place] Phone: [phone]   [place], [place], [place] [phone], Ext. [phone] Mondays & Thursdays: 7-9 AM.  First 15 patients are seen on a first come, first serve basis.    [place] Providers:  Organization         Address  Phone   Notes  [facility] [address], [place] [phone] Also accepts self-pay patients.  [facility] [address], [place]  [phone]   [place], [address], [place] [phone]   [facility] [address], [place] [phone]   Lucianne Lei [address], [place]   [phone] Only accepts [place] Access [place] patients after they have their name applied to their card.   Self-Pay (no insurance) in [facility]:  Organization         Address  Phone   Notes  Sickle Cell Patients, [facility] [place] [phone]   [facility] [place] [phone]   [facility] [place]  [place], [place], [place] [phone]   [facility]/Dr. Osei-Bonsu  [address] [place] or [address], [place] [phone] Phone number for both [place] and [place] locations is the same.  [facility] [address] [place] [phone]   [facility] [address] [place] or [address] [phone]   [facility] [address] [place] [phone], phone; [phone], fax Sees patients 1st and 3rd Saturday of every month.  Must not qualify for public or private insurance (i.e. Medicaid, Medicare, [place] Health Choice, Veterans' Benefits)  Household income should be no more than 200% of the poverty level The clinic cannot treat you if you are pregnant or think you are pregnant  Sexually transmitted diseases are not treated at the clinic.    Dental Care: Organization         Address  Phone  Notes  [facility] [place] [phone] Accepts children up to age 47 who are enrolled in [place] or [place]; pregnant women with a Medicaid card; and  children who have applied for Medicaid or [place] Health Choice, but were declined, whose parents can pay a reduced fee at time of service.  [facility]  [address], [place] [phone] Accepts children up to age 76 who are enrolled in [place] or [place]; pregnant women with a Medicaid card; and children who have applied for Medicaid or [place] Health Choice, but were declined, whose parents can pay a reduced fee at time of service.  [facility]  [place] [phone] Patients are seen by appointment only. Walk-ins are not accepted. [place] will see patients 53 years of age and older. Monday - Tuesday (8am-5pm) Most Wednesdays (8:30-5pm) $30 per visit, cash only  [place]  [place]  Dr, [facility] [phone] Patients are seen by appointment only. Walk-ins are not accepted. [place] will see patients 23 years of age and older. One Wednesday Evening (Monthly: Volunteer Based).  $30 per visit, cash only  [place]  [phone] for adults; Children under age 68, call Graduate Pediatric Dentistry at [phone]. Children aged 23-14, please call [phone] to request a pediatric application.  Dental services are provided in all areas of dental care including fillings, crowns and bridges, complete and partial dentures, implants, gum treatment, root canals, and extractions. Preventive care is also provided. Treatment is provided to both adults and children. Patients are selected via a lottery and there is often a waiting list.   [facility] [address] [place]  [phone] [email or web address]   [facility] [address] [place], [place] [phone], Ext. [phone] Second and Fourth Thursday of each month, opens at 6:30 AM; Clinic ends at 9 AM.  Patients are seen on a first-come first-served  basis, and a limited number are seen during each clinic.   [facility]  [address] Hillard Danker [place], [place] [phone]   Eligibility Requirements You must have lived in [place], [place], or [place] counties for at least the last three months.   You cannot be eligible for state or federal sponsored [employer], including [employer], [place], or [employer].   You generally cannot be eligible for healthcare insurance through your employer.    How to apply: Eligibility screenings are held every Tuesday and Wednesday afternoon from 1:00 pm until 4:00 pm. You do not need an appointment for the interview!  [facility] [address], [place], [place]   [place]  [place] Department  [place]  [phone]    Behavioral Health Resources in the Community: Intensive Outpatient Programs Organization         Address  Phone  Notes  [facility] [place]. [address], [place], [place] [phone]   [facility] [address] [place], [place]   ADS: Alcohol [facility] Drug S[facility] [address] [place], [place]   [place] [address]  [place], [place] or [phone]   Substance Abuse Resources Organization         Address  Phone  Notes  Alcohol and Drug Services  [phone]   [place]  [phone]   The [place]   Chinita Pester  [phone]   Residential & Outpatient Substance Abuse Program  [phone]   Psychological Services Organization         Address  Phone  Notes  [facility] [place]  [place]  [phone]   [place] [address], [place] or [phone]    Mobile Crisis Teams Organization          Address  Phone  Notes  [facility]rapeutic Alternatives, Mobile Crisis Care Unit  [phone]   [facility]ive Psychotherapeutic Services  [address] [place], [place]   [place] [address] [place] [phone]    Self-Help/Support Groups Organization         Address  Phone             Notes  [place]. of [place] - variety of  support groups  [phone] Call for more information  Narcotics Anonymous (NA), [facility] [address], [employer] [place]  2 meetings at this location   Residential Facilities manager         Address  Phone  Notes  [facility] [place],    [place]  [phone]   [facility]  [address], [place] [address]96, [place], [place]    [place], [place] [phone] Admissions: 8am-3pm M-F  Incentives Substance [place] [address],    [place], [place] [phone]   [facility] [address] [place], [place], [place]   The [facility] [address]  [place], [place]   [facility] Programs - Intensive Outpatient [address] Kristeen [address], [place], [place]   [facility] [place].,  [place], [place] [phone] or [phone]   [facility] Services (RTS) [address], [place], [place] Accepts Medicaid  [facility] [place] [address]  [place] [phone] Substance Abuse/Addiction Treatment   [facility] Organization         Address  Phone  Notes  [facility]  [phone]   Domenic Schwab, PhD [address] Arlis [place], [place]   [phone] or [phone]   [place], [place] [phone]   [facility] [address] [place], [place] [phone] Insurance/Medicaid/sponsorship  through [facility] and Families [address], [address]                                    [place], [place] [phone] [place] [address], [place] [phone]    Dr. Adele Schilder  [phone]   [facility] [place] Dept. 1) [address], [place] 2) [place] 3)  [address], [place] [phone]  [phone]   [place] [phone] or [phone] (After Hours)

## 2015-04-23 NOTE — ED Provider Notes (Signed)
CSN: [id number]     Arrival date & time 04/23/15  1117 History   First MD Initiated Contact with Patient 04/23/15 1202     Chief Complaint  Patient presents with  . Foot Pain     (Consider location/radiation/quality/duration/timing/severity/associated sxs/prior Treatment) HPI   Patient presents requesting new post-op shoe for her right foot.  Pt was seen last week reporting she twisted her foot while in bed and has pain across the top of her foot.  States her symptoms are getting better but the postop shoe has gotten loose from walking on it too much.  States she hasn't tried to put a regular shoe on yet.  Denies fevers, swelling, redness, leg pain, injury, chest pain, SOB.    Past Medical History  Diagnosis Date  . Fibromyalgia   . Schizophrenia ([place])   . Asthma   . Bipolar 1 disorder ([place])   . Tobacco abuse    Past Surgical History  Procedure Laterality Date  . Cesarean section    . Tubal ligation     No family history on file. Social History  Substance Use Topics  . Smoking status: Current Every Day Smoker -- 0.50 packs/day    Types: Cigarettes  . Smokeless tobacco: None  . Alcohol Use: Yes     Comment: occ   OB History    No data available     Review of Systems  Constitutional: Negative for fever and chills.  Respiratory: Negative for shortness of breath.   Cardiovascular: Negative for chest pain.  Musculoskeletal: Negative for myalgias and arthralgias.  Skin: Negative for color change and wound.  Neurological: Negative for weakness and numbness.  Hematological: Does not bruise/bleed easily.  Psychiatric/Behavioral: Negative for self-injury.      Allergies  Penicillins  Home Medications   Prior to Admission medications   Medication Sig Start Date End Date Taking? Authorizing Provider  beclomethasone (QVAR) 40 MCG/ACT inhaler Inhale 2 puffs into the lungs 2 (two) times daily.    Historical Provider, MD  benztropine (COGENTIN) 0.5 MG tablet Take 1 mg by  mouth 2 (two) times daily.     Historical Provider, MD  bisacodyl (DULCOLAX) 5 MG EC tablet Take 5 mg by mouth 2 (two) times daily.    Historical Provider, MD  cephALEXin (KEFLEX) 500 MG capsule Take 1 capsule (500 mg total) by mouth 2 (two) times daily. Patient not taking: Reported on 08/15/2014 07/30/14   Quintella Reichert, MD  ciprofloxacin (CIPRO) 500 MG tablet Take 1 tablet (500 mg total) by mouth 2 (two) times daily. 08/15/14   Tiffany Carlota Raspberry, PA-C  docusate sodium (COLACE) 100 MG capsule Take 1 capsule (100 mg total) by mouth every 12 (twelve) hours. 09/03/14   Fredia Sorrow, MD  haloperidol (HALDOL) 1 MG tablet Take 1 mg by mouth 2 (two) times daily.     Historical Provider, MD  ibuprofen (ADVIL,MOTRIN) 200 MG tablet Take 2 tablets (400 mg total) by mouth every 6 (six) hours as needed for moderate pain. 01/05/15   Domenic Moras, PA-C  lithium carbonate (ESKALITH) 450 MG CR tablet Take 450 mg by mouth 2 (two) times daily.    Historical Provider, MD  metroNIDAZOLE (FLAGYL) 500 MG tablet Take 1 tablet (500 mg total) by mouth 2 (two) times daily. One po bid x 7 days 08/29/14   Dahlia Bailiff, PA-C  naproxen (NAPROSYN) 500 MG tablet Take 1 tablet (500 mg total) by mouth 2 (two) times daily with a meal. 11/30/14   Vilinda Blanks  Baird Cancer, PA-C  paliperidone (INVEGA) 9 MG 24 hr tablet Take 9 mg by mouth every morning.    Historical Provider, MD  pantoprazole (PROTONIX) 40 MG tablet Take 1 tablet (40 mg total) by mouth daily. 08/29/14   Dahlia Bailiff, PA-C   BP 126/72 mmHg  Pulse 76  Temp(Src) 97.3 F (36.3 C) (Oral)  Resp 16  SpO2 100% Physical Exam  Constitutional: She appears well-developed and well-nourished. No distress.  HENT:  Head: Normocephalic and atraumatic.  Neck: Neck supple.  Cardiovascular: Normal rate and intact distal pulses.   Pulmonary/Chest: Effort normal.  Musculoskeletal: Normal range of motion. She exhibits no edema or tenderness.  Right foot without edema, tenderness, erythema.  Joints without  tenderness.  Full AROM of right toes, ankle.  Calf is nontender, no edema, erythema.  Sensation intact.  Pulses intact.  Gait is normal.   Neurological: She is alert. She exhibits normal muscle tone.  Skin: She is not diaphoretic.  Nursing note and vitals reviewed.   ED Course  Procedures (including critical care time) Labs Review Labs Reviewed - No data to display  Imaging Review No results found. I have personally reviewed and evaluated these images and lab results as part of my medical decision-making.   EKG Interpretation None      MDM   Final diagnoses:  Right foot pain    Afebrile, nontoxic patient with request for new postop boot from foot sprain sustained last week while in bed, course is improving.  Also requested a drink, lunch, bus pass.  No concerning findings on exam.   D/C home with PCP follow up.  Discussed result, findings, treatment, and follow up  with patient.  Pt given return precautions.  Pt verbalizes understanding and agrees with plan.         Clayton Bibles, PA-C 04/23/15 1252  Dorie Rank, MD 04/24/15 [phone]

## 2015-04-23 NOTE — ED Notes (Signed)
Pt reports to the ED for a new post op boot for her right foot. She reports she turned her ankle and was given the boot but it is now too loose. No lesions, swelling, obvious deformity, or ulcers noted to feet. CMS intact. Pt simply requesting new boot, food, and a bus ticket. Pt alert and in NAD. Resp e/u and skin warm and dry.

## 2015-04-29 ENCOUNTER — Encounter (HOSPITAL_COMMUNITY): Payer: Self-pay | Admitting: Emergency Medicine

## 2015-04-29 ENCOUNTER — Emergency Department (HOSPITAL_COMMUNITY)
Admission: EM | Admit: 2015-04-29 | Discharge: 2015-04-29 | Disposition: A | Discharge status: Home / Self Care | Payer: Medicaid Other | Attending: Emergency Medicine | Admitting: Emergency Medicine

## 2015-04-29 DIAGNOSIS — F17210 Nicotine dependence, cigarettes, uncomplicated: Secondary | ICD-10-CM | POA: Insufficient documentation

## 2015-04-29 DIAGNOSIS — R110 Nausea: Secondary | ICD-10-CM

## 2015-04-29 DIAGNOSIS — Z791 Long term (current) use of non-steroidal anti-inflammatories (NSAID): Secondary | ICD-10-CM | POA: Insufficient documentation

## 2015-04-29 DIAGNOSIS — Z880 Allergy status to penicillin: Secondary | ICD-10-CM | POA: Diagnosis not present

## 2015-04-29 DIAGNOSIS — Z792 Long term (current) use of antibiotics: Secondary | ICD-10-CM | POA: Insufficient documentation

## 2015-04-29 DIAGNOSIS — J45909 Unspecified asthma, uncomplicated: Secondary | ICD-10-CM | POA: Diagnosis not present

## 2015-04-29 DIAGNOSIS — F319 Bipolar disorder, unspecified: Secondary | ICD-10-CM | POA: Diagnosis not present

## 2015-04-29 DIAGNOSIS — F209 Schizophrenia, unspecified: Secondary | ICD-10-CM | POA: Insufficient documentation

## 2015-04-29 DIAGNOSIS — Z7951 Long term (current) use of inhaled steroids: Secondary | ICD-10-CM | POA: Insufficient documentation

## 2015-04-29 DIAGNOSIS — Z79899 Other long term (current) drug therapy: Secondary | ICD-10-CM | POA: Insufficient documentation

## 2015-04-29 DIAGNOSIS — Z8739 Personal history of other diseases of the musculoskeletal system and connective tissue: Secondary | ICD-10-CM | POA: Insufficient documentation

## 2015-04-29 LAB — COMPREHENSIVE METABOLIC PANEL
ALT: 42 U/L (ref 14–54)
AST: 39 U/L (ref 15–41)
Albumin: 3.4 g/dL — ABNORMAL LOW (ref 3.5–5.0)
Alkaline Phosphatase: 65 U/L (ref 38–126)
Anion gap: 6 (ref 5–15)
BUN: <5 mg/dL — ABNORMAL LOW (ref 6–20)
CO2: 26 mmol/L (ref 22–32)
Calcium: 9.8 mg/dL (ref 8.9–10.3)
Chloride: 104 mmol/L (ref 101–111)
Creatinine, Ser: 0.64 mg/dL (ref 0.44–1.00)
GFR calc Af Amer: >60 mL/min (ref >60)
GFR calc non Af Amer: >60 mL/min (ref >60)
Glucose, Bld: 111 mg/dL — ABNORMAL HIGH (ref 65–99)
Potassium: 4.4 mmol/L (ref 3.5–5.1)
Sodium: 136 mmol/L (ref 135–145)
Total Bilirubin: 0.4 mg/dL (ref 0.3–1.2)
Total Protein: 7.3 g/dL (ref 6.5–8.1)

## 2015-04-29 LAB — RAPID URINE DRUG SCREEN, HOSP PERFORMED
Amphetamines: NOT DETECTED
Barbiturates: NOT DETECTED
Benzodiazepines: NOT DETECTED
Cocaine: NOT DETECTED
Opiates: NOT DETECTED
Tetrahydrocannabinol: NOT DETECTED

## 2015-04-29 LAB — CBC
HCT: 39 % (ref 36.0–46.0)
Hemoglobin: 12 g/dL (ref 12.0–15.0)
MCH: 22.7 pg — ABNORMAL LOW (ref 26.0–34.0)
MCHC: 30.8 g/dL (ref 30.0–36.0)
MCV: 73.7 fL — ABNORMAL LOW (ref 78.0–100.0)
Platelets: 251 10*3/uL (ref 150–400)
RBC: 5.29 MIL/uL — ABNORMAL HIGH (ref 3.87–5.11)
RDW: 15.7 % — ABNORMAL HIGH (ref 11.5–15.5)
WBC: 4.2 10*3/uL (ref 4.0–10.5)

## 2015-04-29 LAB — LITHIUM LEVEL: Lithium Lvl: 0.51 mmol/L — ABNORMAL LOW (ref 0.60–1.20)

## 2015-04-29 LAB — SALICYLATE LEVEL: Salicylate Lvl: <4 mg/dL (ref 2.8–30.0)

## 2015-04-29 LAB — ACETAMINOPHEN LEVEL: Acetaminophen (Tylenol), Serum: <10 ug/mL — ABNORMAL LOW (ref 10–30)

## 2015-04-29 LAB — ETHANOL: Alcohol, Ethyl (B): <5 mg/dL (ref <5)

## 2015-04-29 MED ORDER — ONDANSETRON 4 MG PO TBDP
8.0000 mg | ORAL_TABLET | Freq: Once | ORAL | Status: AC
Start: 2015-04-29 — End: 2015-04-29
  Administered 2015-04-29: 8 mg via ORAL
  Filled 2015-04-29: qty 2
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
## 2015-04-29 NOTE — Discharge Instructions (Signed)
It was our pleasure to provide your ER care today - we hope that you feel better.  Rest. Drink plenty of fluids.  Follow up with primary care doctor in the next few days if symptoms fail to improve/resolve.  For mental health issues and/or crisis, go directly to [facility].  Return to ER if worse, new symptoms, fevers, persistent vomiting, difficulty breathing, other concern.     Nausea and Vomiting Nausea is a sick feeling that often comes before throwing up (vomiting). Vomiting is a reflex where stomach contents come out of your mouth. Vomiting can cause severe loss of body fluids (dehydration). Children and elderly adults can become dehydrated quickly, especially if they also have diarrhea. Nausea and vomiting are symptoms of a condition or disease. It is important to find the cause of your symptoms. CAUSES   Direct irritation of the stomach lining. This irritation can result from increased acid production (gastroesophageal reflux disease), infection, food poisoning, taking certain medicines (such as nonsteroidal anti-inflammatory drugs), alcohol use, or tobacco use.  Signals from the brain.These signals could be caused by a headache, heat exposure, an inner ear disturbance, increased pressure in the brain from injury, infection, a tumor, or a concussion, pain, emotional stimulus, or metabolic problems.  An obstruction in the gastrointestinal tract (bowel obstruction).  Illnesses such as diabetes, hepatitis, gallbladder problems, appendicitis, kidney problems, cancer, sepsis, atypical symptoms of a heart attack, or eating disorders.  Medical treatments such as chemotherapy and radiation.  Receiving medicine that makes you sleep (general anesthetic) during surgery. DIAGNOSIS Your caregiver may ask for tests to be done if the problems do not improve after a few days. Tests may also be done if symptoms are severe or if the reason for the nausea and vomiting is not clear. Tests may  include:  Urine tests.  Blood tests.  Stool tests.  Cultures (to look for evidence of infection).  X-rays or other imaging studies. Test results can help your caregiver make decisions about treatment or the need for additional tests. TREATMENT You need to stay well hydrated. Drink frequently but in small amounts.You may wish to drink water, sports drinks, clear broth, or eat frozen ice pops or gelatin dessert to help stay hydrated.When you eat, eating slowly may help prevent nausea.There are also some antinausea medicines that may help prevent nausea. HOME CARE INSTRUCTIONS   Take all medicine as directed by your caregiver.  If you do not have an appetite, do not force yourself to eat. However, you must continue to drink fluids.  If you have an appetite, eat a normal diet unless your caregiver tells you differently.  Eat a variety of complex carbohydrates (rice, wheat, potatoes, bread), lean meats, yogurt, fruits, and vegetables.  Avoid high-fat foods because they are more difficult to digest.  Drink enough water and fluids to keep your urine clear or pale yellow.  If you are dehydrated, ask your caregiver for specific rehydration instructions. Signs of dehydration may include:  Severe thirst.  Dry lips and mouth.  Dizziness.  Dark urine.  Decreasing urine frequency and amount.  Confusion.  Rapid breathing or pulse. SEEK IMMEDIATE MEDICAL CARE IF:   You have blood or brown flecks (like coffee grounds) in your vomit.  You have black or bloody stools.  You have a severe headache or stiff neck.  You are confused.  You have severe abdominal pain.  You have chest pain or trouble breathing.  You do not urinate at least once every 8 hours.  You  develop cold or clammy skin.  You continue to vomit for longer than 24 to 48 hours.  You have a fever. MAKE SURE YOU:   Understand these instructions.  Will watch your condition.  Will get help right away if  you are not doing well or get worse.   This information is not intended to replace advice given to you by your health care provider. Make sure you discuss any questions you have with your health care provider.   Document Released: 04/14/2005 Document Revised: 07/07/2011 Document Reviewed: 09/11/2010 Elsevier Interactive Patient Education [employer].
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
## 2015-04-29 NOTE — ED Notes (Signed)
Gave pt sprite and crackers with peanutbutter
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
## 2015-05-26 ENCOUNTER — Emergency Department (HOSPITAL_COMMUNITY)
Admission: EM | Admit: 2015-05-26 | Discharge: 2015-05-26 | Disposition: A | Discharge status: Home / Self Care | Payer: Medicaid Other | Attending: Emergency Medicine | Admitting: Emergency Medicine

## 2015-05-26 ENCOUNTER — Encounter (HOSPITAL_COMMUNITY): Payer: Self-pay | Admitting: Emergency Medicine

## 2015-05-26 DIAGNOSIS — N760 Acute vaginitis: Secondary | ICD-10-CM | POA: Diagnosis not present

## 2015-05-26 DIAGNOSIS — Z7982 Long term (current) use of aspirin: Secondary | ICD-10-CM | POA: Diagnosis not present

## 2015-05-26 DIAGNOSIS — B9689 Other specified bacterial agents as the cause of diseases classified elsewhere: Secondary | ICD-10-CM

## 2015-05-26 DIAGNOSIS — J45909 Unspecified asthma, uncomplicated: Secondary | ICD-10-CM | POA: Diagnosis not present

## 2015-05-26 DIAGNOSIS — M797 Fibromyalgia: Secondary | ICD-10-CM | POA: Insufficient documentation

## 2015-05-26 DIAGNOSIS — N898 Other specified noninflammatory disorders of vagina: Secondary | ICD-10-CM | POA: Diagnosis present

## 2015-05-26 DIAGNOSIS — F319 Bipolar disorder, unspecified: Secondary | ICD-10-CM | POA: Diagnosis not present

## 2015-05-26 DIAGNOSIS — Z792 Long term (current) use of antibiotics: Secondary | ICD-10-CM | POA: Insufficient documentation

## 2015-05-26 DIAGNOSIS — F17210 Nicotine dependence, cigarettes, uncomplicated: Secondary | ICD-10-CM | POA: Diagnosis not present

## 2015-05-26 DIAGNOSIS — Z7951 Long term (current) use of inhaled steroids: Secondary | ICD-10-CM | POA: Diagnosis not present

## 2015-05-26 DIAGNOSIS — F209 Schizophrenia, unspecified: Secondary | ICD-10-CM | POA: Insufficient documentation

## 2015-05-26 DIAGNOSIS — Z880 Allergy status to penicillin: Secondary | ICD-10-CM | POA: Diagnosis not present

## 2015-05-26 LAB — URINALYSIS, ROUTINE W REFLEX MICROSCOPIC
Bilirubin Urine: NEGATIVE
Glucose, UA: NEGATIVE mg/dL
Ketones, ur: NEGATIVE mg/dL
Nitrite: POSITIVE — AB
Protein, ur: NEGATIVE mg/dL
Specific Gravity, Urine: 1.008 (ref 1.005–1.030)
pH: 7.5 (ref 5.0–8.0)

## 2015-05-26 LAB — URINE MICROSCOPIC-ADD ON

## 2015-05-26 LAB — WET PREP, GENITAL
Sperm: NONE SEEN
Trich, Wet Prep: NONE SEEN
Yeast Wet Prep HPF POC: NONE SEEN

## 2015-05-26 MED ORDER — CEPHALEXIN 500 MG PO CAPS: 500.0000 mg | ORAL_CAPSULE | Freq: Two times a day (BID) | ORAL | Status: DC

## 2015-05-26 MED ORDER — METRONIDAZOLE 500 MG PO TABS: 500.0000 mg | ORAL_TABLET | Freq: Two times a day (BID) | ORAL | Status: DC

## 2015-05-26 MED ORDER — LIDOCAINE HCL (PF) 1 % IJ SOLN
1.0000 mL | Freq: Once | INTRAMUSCULAR | Status: AC
  Administered 2015-05-26: 1 mL
  Filled 2015-05-26: qty 5

## 2015-05-26 MED ORDER — AZITHROMYCIN 250 MG PO TABS
1000.0000 mg | ORAL_TABLET | Freq: Once | ORAL | Status: AC
  Administered 2015-05-26: 1000 mg via ORAL
  Filled 2015-05-26: qty 4

## 2015-05-26 MED ORDER — CEFTRIAXONE SODIUM 1 G IJ SOLR
1.0000 g | Freq: Once | INTRAMUSCULAR | Status: AC
  Administered 2015-05-26: 1 g via INTRAMUSCULAR
  Filled 2015-05-26: qty 10

## 2015-05-26 NOTE — ED Provider Notes (Signed)
CSN: [id number]     Arrival date & time 05/26/15  1104 History   First MD Initiated Contact with Patient 05/26/15 1123     Chief Complaint  Patient presents with  . Vaginal Discharge   HPI   Karen Best is a 60 y.o. F PMH significant for fibromyalgia, schizophrenia, bipolar I disorder presenting with a 3 day history of vaginal burning and discharge (increased amount, no change in color). She denies fevers, chills, abdominal pain, N/V, urinary symptoms, vaginal bleeding.    Past Medical History  Diagnosis Date  . Fibromyalgia   . Schizophrenia ([place])   . Asthma   . Bipolar 1 disorder ([place])   . Tobacco abuse    Past Surgical History  Procedure Laterality Date  . Cesarean section    . Tubal ligation     History reviewed. No pertinent family history. Social History  Substance Use Topics  . Smoking status: Current Every Day Smoker -- 0.50 packs/day    Types: Cigarettes  . Smokeless tobacco: None  . Alcohol Use: Yes     Comment: occ   OB History    No data available     Review of Systems  Ten systems are reviewed and are negative for acute change except as noted in the HPI  Allergies  Penicillins  Home Medications   Prior to Admission medications   Medication Sig Start Date End Date Taking? Authorizing Provider  aspirin 81 MG tablet Take 81 mg by mouth daily.    Historical Provider, MD  beclomethasone (QVAR) 40 MCG/ACT inhaler Inhale 2 puffs into the lungs 2 (two) times daily.    Historical Provider, MD  benztropine (COGENTIN) 1 MG tablet Take 1 mg by mouth 2 (two) times daily.    Historical Provider, MD  cephALEXin (KEFLEX) 500 MG capsule Take 1 capsule (500 mg total) by mouth 2 (two) times daily. Patient not taking: Reported on 08/15/2014 07/30/14   Quintella Reichert, MD  ciprofloxacin (CIPRO) 500 MG tablet Take 1 tablet (500 mg total) by mouth 2 (two) times daily. Patient not taking: Reported on 04/29/2015 08/15/14   Delos Haring, PA-C  docusate sodium (COLACE) 100 MG  capsule Take 1 capsule (100 mg total) by mouth every 12 (twelve) hours. Patient not taking: Reported on 04/29/2015 09/03/14   Fredia Sorrow, MD  ibuprofen (ADVIL,MOTRIN) 200 MG tablet Take 2 tablets (400 mg total) by mouth every 6 (six) hours as needed for moderate pain. Patient not taking: Reported on 04/29/2015 01/05/15   Domenic Moras, PA-C  lithium carbonate (ESKALITH) 450 MG CR tablet Take 450 mg by mouth 2 (two) times daily.    Historical Provider, MD  metroNIDAZOLE (FLAGYL) 500 MG tablet Take 1 tablet (500 mg total) by mouth 2 (two) times daily. One po bid x 7 days Patient not taking: Reported on 04/29/2015 08/29/14   Dahlia Bailiff, PA-C  naproxen (NAPROSYN) 500 MG tablet Take 1 tablet (500 mg total) by mouth 2 (two) times daily with a meal. Patient not taking: Reported on 04/29/2015 11/30/14   Larene Pickett, PA-C  paliperidone (INVEGA) 9 MG 24 hr tablet Take 9 mg by mouth every morning.    Historical Provider, MD  pantoprazole (PROTONIX) 40 MG tablet Take 1 tablet (40 mg total) by mouth daily. Patient not taking: Reported on 04/29/2015 08/29/14   Dahlia Bailiff, PA-C   BP 112/74 mmHg  Pulse 72  Temp(Src) 98.6 F (37 C) (Oral)  Resp 16  Ht 5\' 5"  (1.651 m)  Wt  52.164 kg  BMI 19.14 kg/m2  SpO2 100% Physical Exam  Constitutional: She appears well-developed and well-nourished. No distress.  HENT:  Head: Normocephalic and atraumatic.  Mouth/Throat: Oropharynx is clear and moist. No oropharyngeal exudate.  Eyes: Conjunctivae are normal. Right eye exhibits no discharge. Left eye exhibits no discharge. No scleral icterus.  Neck: No tracheal deviation present.  Cardiovascular: Normal rate, regular rhythm, normal heart sounds and intact distal pulses.  Exam reveals no gallop and no friction rub.   No murmur heard. Pulmonary/Chest: Effort normal and breath sounds normal. No respiratory distress. She has no wheezes. She has no rales. She exhibits no tenderness.  Abdominal: Soft. Bowel sounds are normal. She exhibits  no distension and no mass. There is tenderness. There is no rebound and no guarding.  Suprapubic tenderness  Genitourinary:  Pelvic exam: normal external genitalia, vulva, vagina, cervix, uterus and adnexa (no tenderness).   Musculoskeletal: She exhibits no edema.  Lymphadenopathy:    She has no cervical adenopathy.  Neurological: She is alert. Coordination normal.  Skin: Skin is warm and dry. No rash noted. She is not diaphoretic. No erythema.  Psychiatric: She has a normal mood and affect. Her behavior is normal.  Nursing note and vitals reviewed.   ED Course  Procedures  Labs Review Labs Reviewed  WET PREP, GENITAL - Abnormal; Notable for the following:    Clue Cells Wet Prep HPF POC PRESENT (*)    WBC, Wet Prep HPF POC MANY (*)    All other components within normal limits  URINALYSIS, ROUTINE W REFLEX MICROSCOPIC (NOT AT [facility]) - Abnormal; Notable for the following:    APPearance TURBID (*)    Hgb urine dipstick LARGE (*)    Nitrite POSITIVE (*)    Leukocytes, UA LARGE (*)    All other components within normal limits  URINE MICROSCOPIC-ADD ON - Abnormal; Notable for the following:    Squamous Epithelial / LPF 0-5 (*)    Bacteria, UA MANY (*)    All other components within normal limits  RPR  HIV ANTIBODY (ROUTINE TESTING)  GC/CHLAMYDIA PROBE AMP ([place]) NOT AT [facility]     MDM   Final diagnoses:  Bacterial vaginosis   Patient non-toxic appearing and VSS. Based on patient history and physical exam, most likely etiologies are STI vs yeast infection vs BV vs UTI. Wet prep with clue cells. Nitrite positive UTI on UA.   Medications  cefTRIAXone (ROCEPHIN) injection 1 g (1 g Intramuscular Given 05/26/15 1215)  azithromycin (ZITHROMAX) tablet 1,000 mg (1,000 mg Oral Given 05/26/15 1215)  lidocaine (PF) (XYLOCAINE) 1 % injection 1 mL (1 mL Other Given 05/26/15 1215)   Patient treated for STI in ED.  Patient may be safely discharged home with keflex and flagyl.  Discussed  reasons for return. Patient to follow-up with primary care provider within one week. Patient in understanding and agreement with the plan.   [place], PA-C 05/28/15 1723  Orlie Dakin, MD 05/28/15 [phone]

## 2015-05-26 NOTE — ED Notes (Signed)
Pt ambulates independently and with steady gait at time of discharge. Discharge instructions and follow up information reviewed with patient. No other questions or concerns voiced at this time. RX x 2 given. 

## 2015-05-26 NOTE — ED Notes (Signed)
Pt ambulatory to restroom

## 2015-05-26 NOTE — ED Notes (Signed)
Pt here with vaginal discharge and burning starting on Thursday.

## 2015-05-26 NOTE — Discharge Instructions (Signed)
Ms. Karen Best,  Nice meeting you! WEAR A CONDOM EVERY TIME YOU HAVE SEX. Please follow-up with your gynecologist. Return to the emergency department if you develop fevers, chills, abdominal pain. Feel better soon!  S. Wendie Simmer, PA-C   Bacterial Vaginosis Bacterial vaginosis is an infection of the vagina. It happens when too many germs (bacteria) grow in the vagina. Having this infection puts you at risk for getting other infections from sex. Treating this infection can help lower your risk for other infections, such as:   Chlamydia.  Gonorrhea.  HIV.  Herpes. HOME CARE  Take your medicine as told by your doctor.  Finish your medicine even if you start to feel better.  Tell your sex partner that you have an infection. They should see their doctor for treatment.  During treatment:  Avoid sex or use condoms correctly.  Do not douche.  Do not drink alcohol unless your doctor tells you it is ok.  Do not breastfeed unless your doctor tells you it is ok. GET HELP IF:  You are not getting better after 3 days of treatment.  You have more grey fluid (discharge) coming from your vagina than before.  You have more pain than before.  You have a fever. MAKE SURE YOU:   Understand these instructions.  Will watch your condition.  Will get help right away if you are not doing well or get worse.   This information is not intended to replace advice given to you by your health care provider. Make sure you discuss any questions you have with your health care provider.   Document Released: 01/22/2008 Document Revised: 05/05/2014 Document Reviewed: 11/24/2012 Elsevier Interactive Patient Education [employer].

## 2015-05-27 LAB — RPR: RPR Ser Ql: NONREACTIVE

## 2015-05-27 LAB — HIV ANTIBODY (ROUTINE TESTING W REFLEX): HIV Screen 4th Generation wRfx: NONREACTIVE

## 2015-05-28 LAB — GC/CHLAMYDIA PROBE AMP (~~LOC~~) NOT AT ARMC
Chlamydia: NEGATIVE
Neisseria Gonorrhea: NEGATIVE

## 2015-07-18 ENCOUNTER — Encounter (HOSPITAL_COMMUNITY): Payer: Self-pay | Admitting: *Deleted

## 2015-07-18 ENCOUNTER — Emergency Department (HOSPITAL_COMMUNITY)
Admission: EM | Admit: 2015-07-18 | Discharge: 2015-07-18 | Disposition: A | Discharge status: Home / Self Care | Payer: Medicaid Other | Attending: Emergency Medicine | Admitting: Emergency Medicine

## 2015-07-18 DIAGNOSIS — Z79899 Other long term (current) drug therapy: Secondary | ICD-10-CM | POA: Insufficient documentation

## 2015-07-18 DIAGNOSIS — F209 Schizophrenia, unspecified: Secondary | ICD-10-CM | POA: Diagnosis not present

## 2015-07-18 DIAGNOSIS — Z7951 Long term (current) use of inhaled steroids: Secondary | ICD-10-CM | POA: Insufficient documentation

## 2015-07-18 DIAGNOSIS — N898 Other specified noninflammatory disorders of vagina: Secondary | ICD-10-CM

## 2015-07-18 DIAGNOSIS — F319 Bipolar disorder, unspecified: Secondary | ICD-10-CM | POA: Diagnosis not present

## 2015-07-18 DIAGNOSIS — F17210 Nicotine dependence, cigarettes, uncomplicated: Secondary | ICD-10-CM | POA: Diagnosis not present

## 2015-07-18 DIAGNOSIS — Z7982 Long term (current) use of aspirin: Secondary | ICD-10-CM | POA: Insufficient documentation

## 2015-07-18 DIAGNOSIS — N390 Urinary tract infection, site not specified: Secondary | ICD-10-CM | POA: Insufficient documentation

## 2015-07-18 DIAGNOSIS — Z880 Allergy status to penicillin: Secondary | ICD-10-CM | POA: Insufficient documentation

## 2015-07-18 DIAGNOSIS — Z8739 Personal history of other diseases of the musculoskeletal system and connective tissue: Secondary | ICD-10-CM | POA: Diagnosis not present

## 2015-07-18 DIAGNOSIS — R319 Hematuria, unspecified: Secondary | ICD-10-CM

## 2015-07-18 DIAGNOSIS — Z792 Long term (current) use of antibiotics: Secondary | ICD-10-CM | POA: Diagnosis not present

## 2015-07-18 DIAGNOSIS — J45909 Unspecified asthma, uncomplicated: Secondary | ICD-10-CM | POA: Insufficient documentation

## 2015-07-18 LAB — URINALYSIS, ROUTINE W REFLEX MICROSCOPIC
Bilirubin Urine: NEGATIVE
Glucose, UA: NEGATIVE mg/dL
Ketones, ur: NEGATIVE mg/dL
Nitrite: POSITIVE — AB
Protein, ur: 100 mg/dL — AB
Specific Gravity, Urine: 1.011 (ref 1.005–1.030)
pH: 7 (ref 5.0–8.0)

## 2015-07-18 LAB — WET PREP, GENITAL
Clue Cells Wet Prep HPF POC: NONE SEEN
Sperm: NONE SEEN
Trich, Wet Prep: NONE SEEN
Yeast Wet Prep HPF POC: NONE SEEN

## 2015-07-18 LAB — URINE MICROSCOPIC-ADD ON

## 2015-07-18 MED ORDER — LIDOCAINE HCL (PF) 1 % IJ SOLN
5.0000 mL | Freq: Once | INTRAMUSCULAR | Status: AC
  Administered 2015-07-18: 5 mL
  Filled 2015-07-18: qty 5

## 2015-07-18 MED ORDER — CEPHALEXIN 500 MG PO CAPS: 500.0000 mg | ORAL_CAPSULE | Freq: Three times a day (TID) | ORAL | Status: DC

## 2015-07-18 MED ORDER — CEFTRIAXONE SODIUM 250 MG IJ SOLR
250.0000 mg | Freq: Once | INTRAMUSCULAR | Status: AC
  Administered 2015-07-18: 250 mg via INTRAMUSCULAR
  Filled 2015-07-18: qty 250

## 2015-07-18 MED ORDER — AZITHROMYCIN 250 MG PO TABS
1000.0000 mg | ORAL_TABLET | Freq: Once | ORAL | Status: AC
  Administered 2015-07-18: 1000 mg via ORAL
  Filled 2015-07-18: qty 4

## 2015-07-18 NOTE — Discharge Instructions (Signed)
Please call your primary care provider to schedule a follow up appointment within one week. Your urine today shows sign of infection. I will give a prescription for a one week course of antibiotics. Please take the entire bottle as directed. We also treated you with antibiotics in the emergency room today for your vaginal symptoms.   Return to the ER for new or worsening symptoms.

## 2015-07-18 NOTE — ED Provider Notes (Signed)
CSN: [id number]     Arrival date & time 07/18/15  1634 History  By signing my name below, I, Randa Evens, attest that this documentation has been prepared under the direction and in the presence of Shivali Quackenbush Y Nickolaos Brallier, [place]. Electronically Signed: Randa Evens, ED Scribe. 07/18/2015. 5:38 PM.      Chief Complaint  Patient presents with  . Vaginal Discharge   The history is provided by the patient. No language interpreter was used.   HPI Comments: Karen Best is a 60 y.o. female who presents to the Emergency Department complaining of vaginal burning and discharge onset 1 week prior. Pt states she is sexually active with more than 1 partner and that she intermittently uses protection. States the burning feels like it is "inside" and she is not sure if it is worse with urination. Denies increased urinary frequency/urgency. States discharge increased from baseline. Pt doesn't report any treatments tried PTA. Denies abdominal pain, back pain, hematuria, fever, chills, nausea, vomiting.  Past Medical History  Diagnosis Date  . Fibromyalgia   . Schizophrenia ([place])   . Asthma   . Bipolar 1 disorder ([place])   . Tobacco abuse    Past Surgical History  Procedure Laterality Date  . Cesarean section    . Tubal ligation     History reviewed. No pertinent family history. Social History  Substance Use Topics  . Smoking status: Current Every Day Smoker -- 0.50 packs/day    Types: Cigarettes  . Smokeless tobacco: None  . Alcohol Use: Yes     Comment: occ   OB History    No data available     Review of Systems  Constitutional: Negative for fever and chills.  Gastrointestinal: Negative for nausea, vomiting and abdominal pain.  Genitourinary: Positive for vaginal discharge and vaginal pain (burning). Negative for hematuria.  Musculoskeletal: Negative for back pain.  All other systems reviewed and are negative.     Allergies  Penicillins  Home Medications   Prior to Admission  medications   Medication Sig Start Date End Date Taking? Authorizing Provider  aspirin 81 MG tablet Take 81 mg by mouth daily.    Historical Provider, MD  beclomethasone (QVAR) 40 MCG/ACT inhaler Inhale 2 puffs into the lungs 2 (two) times daily.    Historical Provider, MD  benztropine (COGENTIN) 1 MG tablet Take 1 mg by mouth 2 (two) times daily.    Historical Provider, MD  cephALEXin (KEFLEX) 500 MG capsule Take 1 capsule (500 mg total) by mouth 2 (two) times daily. 05/26/15   Boaz Lions, PA-C  ciprofloxacin (CIPRO) 500 MG tablet Take 1 tablet (500 mg total) by mouth 2 (two) times daily. Patient not taking: Reported on 04/29/2015 08/15/14   Delos Haring, PA-C  docusate sodium (COLACE) 100 MG capsule Take 1 capsule (100 mg total) by mouth every 12 (twelve) hours. Patient not taking: Reported on 04/29/2015 09/03/14   Fredia Sorrow, MD  ibuprofen (ADVIL,MOTRIN) 200 MG tablet Take 2 tablets (400 mg total) by mouth every 6 (six) hours as needed for moderate pain. Patient not taking: Reported on 04/29/2015 01/05/15   Domenic Moras, PA-C  lithium carbonate (ESKALITH) 450 MG CR tablet Take 450 mg by mouth 2 (two) times daily.    Historical Provider, MD  metroNIDAZOLE (FLAGYL) 500 MG tablet Take 1 tablet (500 mg total) by mouth 2 (two) times daily. 05/26/15   [place] Lions, PA-C  naproxen (NAPROSYN) 500 MG tablet Take 1 tablet (500 mg total) by mouth  2 (two) times daily with a meal. Patient not taking: Reported on 04/29/2015 11/30/14   Larene Pickett, PA-C  paliperidone (INVEGA) 9 MG 24 hr tablet Take 9 mg by mouth daily.     Historical Provider, MD  pantoprazole (PROTONIX) 40 MG tablet Take 1 tablet (40 mg total) by mouth daily. Patient not taking: Reported on 04/29/2015 08/29/14   Dahlia Bailiff, PA-C   BP 139/75 mmHg  Pulse 70  Temp(Src) 97.4 F (36.3 C) (Oral)  Resp 18  SpO2 100%   Physical Exam  Constitutional: She is oriented to person, place, and time. She appears well-developed and  well-nourished. No distress.  HENT:  Head: Normocephalic and atraumatic.  Eyes: Conjunctivae and EOM are normal.  Neck: Neck supple. No tracheal deviation present.  Cardiovascular: Normal rate.   Pulmonary/Chest: Effort normal. No respiratory distress.  Abdominal: Soft. She exhibits no distension. There is no tenderness.  Genitourinary:  Vaginal vault with copious thick white discharge, few clumps. Cervix nonfriable. No bleeding no masses. No cervical motion tenderness or adnexal tenderness.  Musculoskeletal: Normal range of motion.  Neurological: She is alert and oriented to person, place, and time.  Skin: Skin is warm and dry.  Psychiatric: She has a normal mood and affect. Her behavior is normal.  Nursing note and vitals reviewed.   ED Course  Procedures (including critical care time) DIAGNOSTIC STUDIES: Oxygen Saturation is 100% on RA, normal by my interpretation.    COORDINATION OF CARE: 5:48 PM-Discussed treatment plan with pt at bedside and pt agreed to plan.     Labs Review Labs Reviewed  WET PREP, GENITAL - Abnormal; Notable for the following:    WBC, Wet Prep HPF POC MODERATE (*)    All other components within normal limits  URINALYSIS, ROUTINE W REFLEX MICROSCOPIC (NOT AT [facility]) - Abnormal; Notable for the following:    APPearance TURBID (*)    Hgb urine dipstick LARGE (*)    Protein, ur 100 (*)    Nitrite POSITIVE (*)    Leukocytes, UA LARGE (*)    All other components within normal limits  URINE MICROSCOPIC-ADD ON - Abnormal; Notable for the following:    Squamous Epithelial / LPF 6-30 (*)    Bacteria, UA MANY (*)    All other components within normal limits  URINE CULTURE  GC/CHLAMYDIA PROBE AMP ([place]) NOT AT [facility]    Imaging Review No results found.    EKG Interpretation None      MDM   Final diagnoses:  Urinary tract infection with hematuria, site unspecified  Vaginal discharge   Urine with positive nitrites, TNTC WBC, many bacteria,  large hemoglobin. 100 protein. Urine culture from 08/2014 shows e.coli sensitive to cefazolin/ceftriaxone. Will send for culture today and give rx for keflex. Will treat vaginal discharge empirically with rocephin and azithromycin given sexual activity without protection. Wet prep shows WBC but no yeast, clue cells, or trich. GC/chlamydia sent. HIV and RPR deferred today as she had testing two months ago which was negative. I instructed pt to f/u with PCP within one week. STrongly encouraged safe sex practices. ER return precautions given.   I personally performed the services described in this documentation, which was scribed in my presence. The recorded information has been reviewed and is accurate.     Anne Ng, Hershal Coria 07/18/15 Curly Rim  Dorie Rank, MD 07/19/15 [phone]

## 2015-07-18 NOTE — ED Notes (Signed)
Pt walked out before receiving Discharge papers

## 2015-07-18 NOTE — ED Notes (Signed)
Pt reports vaginal burning and discharge x 1 week.

## 2015-07-19 LAB — GC/CHLAMYDIA PROBE AMP (~~LOC~~) NOT AT ARMC
Chlamydia: NEGATIVE
Neisseria Gonorrhea: NEGATIVE

## 2015-07-20 LAB — URINE CULTURE: Culture: >=100000

## 2015-07-21 NOTE — Progress Notes (Signed)
ED Antimicrobial Stewardship Positive Culture Follow Up   Karen Best is an 60 y.o. female who presented to [facility] on 07/18/2015 with a chief complaint of  Chief Complaint  Patient presents with  . Vaginal Discharge    Recent Results (from the past 720 hour(s))  Wet prep, genital     Status: Abnormal   Collection Time: 07/18/15  7:03 PM  Result Value Ref Range Status   Yeast Wet Prep HPF POC NONE SEEN NONE SEEN Final   Trich, Wet Prep NONE SEEN NONE SEEN Final   Clue Cells Wet Prep HPF POC NONE SEEN NONE SEEN Final   WBC, Wet Prep HPF POC MODERATE (A) NONE SEEN Final   Sperm NONE SEEN  Final  Urine culture     Status: None   Collection Time: 07/18/15  7:03 PM  Result Value Ref Range Status   Specimen Description URINE, RANDOM  Final   Special Requests NONE  Final   Culture   Final    >=100,000 COLONIES/mL STAPHYLOCOCCUS SPECIES (COAGULASE NEGATIVE)   Report Status 07/20/2015 FINAL  Final   Organism ID, Bacteria STAPHYLOCOCCUS SPECIES (COAGULASE NEGATIVE)  Final      Susceptibility   Staphylococcus species (coagulase negative) - MIC*    CIPROFLOXACIN <=0.5 SENSITIVE Sensitive     GENTAMICIN <=0.5 SENSITIVE Sensitive     NITROFURANTOIN <=16 SENSITIVE Sensitive     OXACILLIN >=4 RESISTANT Resistant     TETRACYCLINE <=1 SENSITIVE Sensitive     VANCOMYCIN 1 SENSITIVE Sensitive     TRIMETH/SULFA <=10 SENSITIVE Sensitive     CLINDAMYCIN <=0.25 SENSITIVE Sensitive     RIFAMPIN <=0.5 SENSITIVE Sensitive     Inducible Clindamycin NEGATIVE Sensitive     * >=100,000 COLONIES/mL STAPHYLOCOCCUS SPECIES (COAGULASE NEGATIVE)    [x]  Treated with Cephalexin, organism resistant to prescribed antimicrobial  New antibiotic prescription: Stop Keflex Macrobid 100mg  PO BID x 5 days  ED Provider: Jaquita Folds, PA-C   Norva Riffle 07/21/2015, 1:37 PM Infectious Diseases Pharmacist Phone# [phone]

## 2015-07-22 ENCOUNTER — Emergency Department (HOSPITAL_COMMUNITY)
Admission: EM | Admit: 2015-07-22 | Discharge: 2015-07-22 | Disposition: A | Discharge status: Home / Self Care | Payer: Medicaid Other | Attending: Emergency Medicine | Admitting: Emergency Medicine

## 2015-07-22 ENCOUNTER — Emergency Department (HOSPITAL_COMMUNITY): Payer: Medicaid Other

## 2015-07-22 ENCOUNTER — Encounter (HOSPITAL_COMMUNITY): Payer: Self-pay | Admitting: Emergency Medicine

## 2015-07-22 DIAGNOSIS — M797 Fibromyalgia: Secondary | ICD-10-CM | POA: Insufficient documentation

## 2015-07-22 DIAGNOSIS — Z7951 Long term (current) use of inhaled steroids: Secondary | ICD-10-CM | POA: Insufficient documentation

## 2015-07-22 DIAGNOSIS — F319 Bipolar disorder, unspecified: Secondary | ICD-10-CM | POA: Insufficient documentation

## 2015-07-22 DIAGNOSIS — K5900 Constipation, unspecified: Secondary | ICD-10-CM

## 2015-07-22 DIAGNOSIS — Z79899 Other long term (current) drug therapy: Secondary | ICD-10-CM | POA: Diagnosis not present

## 2015-07-22 DIAGNOSIS — F209 Schizophrenia, unspecified: Secondary | ICD-10-CM | POA: Insufficient documentation

## 2015-07-22 DIAGNOSIS — R109 Unspecified abdominal pain: Secondary | ICD-10-CM | POA: Diagnosis present

## 2015-07-22 DIAGNOSIS — Z7982 Long term (current) use of aspirin: Secondary | ICD-10-CM | POA: Diagnosis not present

## 2015-07-22 DIAGNOSIS — K644 Residual hemorrhoidal skin tags: Secondary | ICD-10-CM | POA: Diagnosis not present

## 2015-07-22 DIAGNOSIS — Z792 Long term (current) use of antibiotics: Secondary | ICD-10-CM | POA: Diagnosis not present

## 2015-07-22 DIAGNOSIS — Z9851 Tubal ligation status: Secondary | ICD-10-CM | POA: Diagnosis not present

## 2015-07-22 DIAGNOSIS — Z880 Allergy status to penicillin: Secondary | ICD-10-CM | POA: Diagnosis not present

## 2015-07-22 DIAGNOSIS — F17210 Nicotine dependence, cigarettes, uncomplicated: Secondary | ICD-10-CM | POA: Diagnosis not present

## 2015-07-22 DIAGNOSIS — J45909 Unspecified asthma, uncomplicated: Secondary | ICD-10-CM | POA: Diagnosis not present

## 2015-07-22 LAB — COMPREHENSIVE METABOLIC PANEL
ALT: 21 U/L (ref 14–54)
AST: 23 U/L (ref 15–41)
Albumin: 3.5 g/dL (ref 3.5–5.0)
Alkaline Phosphatase: 56 U/L (ref 38–126)
Anion gap: 8 (ref 5–15)
BUN: 6 mg/dL (ref 6–20)
CO2: 24 mmol/L (ref 22–32)
Calcium: 9.5 mg/dL (ref 8.9–10.3)
Chloride: 105 mmol/L (ref 101–111)
Creatinine, Ser: 0.81 mg/dL (ref 0.44–1.00)
GFR calc Af Amer: >60 mL/min (ref >60)
GFR calc non Af Amer: >60 mL/min (ref >60)
Glucose, Bld: 103 mg/dL — ABNORMAL HIGH (ref 65–99)
Potassium: 3.8 mmol/L (ref 3.5–5.1)
Sodium: 137 mmol/L (ref 135–145)
Total Bilirubin: 0.4 mg/dL (ref 0.3–1.2)
Total Protein: 7.8 g/dL (ref 6.5–8.1)

## 2015-07-22 LAB — CBC
HCT: 41 % (ref 36.0–46.0)
Hemoglobin: 12.4 g/dL (ref 12.0–15.0)
MCH: 22.8 pg — ABNORMAL LOW (ref 26.0–34.0)
MCHC: 30.2 g/dL (ref 30.0–36.0)
MCV: 75.5 fL — ABNORMAL LOW (ref 78.0–100.0)
Platelets: 283 10*3/uL (ref 150–400)
RBC: 5.43 MIL/uL — ABNORMAL HIGH (ref 3.87–5.11)
RDW: 14.3 % (ref 11.5–15.5)
WBC: 4.3 10*3/uL (ref 4.0–10.5)

## 2015-07-22 LAB — LIPASE, BLOOD: Lipase: 28 U/L (ref 11–51)

## 2015-07-22 MED ORDER — LACTULOSE 10 GM/15ML PO SOLN
20.0000 g | Freq: Once | ORAL | Status: AC
  Administered 2015-07-22: 20 g via ORAL
  Filled 2015-07-22: qty 30

## 2015-07-22 MED ORDER — BISACODYL 10 MG RE SUPP
10.0000 mg | Freq: Once | RECTAL | Status: AC
  Administered 2015-07-22: 10 mg via RECTAL
  Filled 2015-07-22: qty 1

## 2015-07-22 MED ORDER — POLYETHYLENE GLYCOL 3350 17 G PO PACK: 17.0000 g | PACK | Freq: Every day | ORAL | Status: DC | PRN

## 2015-07-22 NOTE — Discharge Instructions (Signed)
Read the information below.  Use the prescribed medication as directed.  Please discuss all new medications with your pharmacist.  You may return to the Emergency Department at any time for worsening condition or any new symptoms that concern you.    If you develop high fevers, worsening abdominal pain, uncontrolled vomiting, or are unable to tolerate fluids by mouth, return to the ER for a recheck.     Constipation, Adult Constipation is when a person:  Poops (has a bowel movement) less than 3 times a week.  Has a hard time pooping.  Has poop that is dry, hard, or bigger than normal. HOME CARE   Eat foods with a lot of fiber in them. This includes fruits, vegetables, beans, and whole grains such as brown rice.  Avoid fatty foods and foods with a lot of sugar. This includes french fries, hamburgers, cookies, candy, and soda.  If you are not getting enough fiber from food, take products with added fiber in them (supplements).  Drink enough fluid to keep your pee (urine) clear or pale yellow.  Exercise on a regular basis, or as told by your doctor.  Go to the restroom when you feel like you need to poop. Do not hold it.  Only take medicine as told by your doctor. Do not take medicines that help you poop (laxatives) without talking to your doctor first. GET HELP RIGHT AWAY IF:   You have bright red blood in your poop (stool).  Your constipation lasts more than 4 days or gets worse.  You have belly (abdominal) or butt (rectal) pain.  You have thin poop (as thin as a pencil).  You lose weight, and it cannot be explained. MAKE SURE YOU:   Understand these instructions.  Will watch your condition.  Will get help right away if you are not doing well or get worse.   This information is not intended to replace advice given to you by your health care provider. Make sure you discuss any questions you have with your health care provider.   Document Released: 10/01/2007 Document  Revised: 05/05/2014 Document Reviewed: 01/24/2013 Elsevier Interactive Patient Education [employer].

## 2015-07-22 NOTE — ED Notes (Signed)
Received pt via [facility] from home with c/o abdominal pain onset 2-3 hours PTA. Pt denies N/V/D reports possible constipation. Last Bowel movement was 1 week ago.

## 2015-07-22 NOTE — ED Provider Notes (Signed)
CSN: [id number]     Arrival date & time 07/22/15  [id number] History   First MD Initiated Contact with Patient 07/22/15 0818     Chief Complaint  Patient presents with  . Abdominal Pain  . Constipation     (Consider location/radiation/quality/duration/timing/severity/associated sxs/prior Treatment) HPI   Patient presents with abdominal pain and constipation.  The abdominal pain began overnight and is a sharp pain in the middle of her abdomen.  She has not had a bowel movement for 1 week.  One week ago her stool was normal.   Has not taken anything for her symptoms.  Denies taking pain medications.  Was seen 07/18/15 for vaginal discharge and UTI.  States she is taking the antibiotics and symptoms are improving.  Denies fevers, vomiting.    Past Medical History  Diagnosis Date  . Fibromyalgia   . Schizophrenia ([place])   . Asthma   . Bipolar 1 disorder ([place])   . Tobacco abuse    Past Surgical History  Procedure Laterality Date  . Cesarean section    . Tubal ligation     No family history on file. Social History  Substance Use Topics  . Smoking status: Current Every Day Smoker -- 0.50 packs/day    Types: Cigarettes  . Smokeless tobacco: None  . Alcohol Use: Yes     Comment: occ   OB History    No data available     Review of Systems  All other systems reviewed and are negative.     Allergies  Penicillins  Home Medications   Prior to Admission medications   Medication Sig Start Date End Date Taking? Authorizing Provider  aspirin 81 MG tablet Take 81 mg by mouth daily.    Historical Provider, MD  beclomethasone (QVAR) 40 MCG/ACT inhaler Inhale 2 puffs into the lungs 2 (two) times daily.    Historical Provider, MD  benztropine (COGENTIN) 1 MG tablet Take 1 mg by mouth 2 (two) times daily.    Historical Provider, MD  cephALEXin (KEFLEX) 500 MG capsule Take 1 capsule (500 mg total) by mouth 2 (two) times daily. 05/26/15   [place] Lions, PA-C  cephALEXin (KEFLEX) 500 MG  capsule Take 1 capsule (500 mg total) by mouth 3 (three) times daily. 07/18/15   Olivia Canter Sam, PA-C  ciprofloxacin (CIPRO) 500 MG tablet Take 1 tablet (500 mg total) by mouth 2 (two) times daily. Patient not taking: Reported on 04/29/2015 08/15/14   Delos Haring, PA-C  docusate sodium (COLACE) 100 MG capsule Take 1 capsule (100 mg total) by mouth every 12 (twelve) hours. Patient not taking: Reported on 04/29/2015 09/03/14   Fredia Sorrow, MD  ibuprofen (ADVIL,MOTRIN) 200 MG tablet Take 2 tablets (400 mg total) by mouth every 6 (six) hours as needed for moderate pain. Patient not taking: Reported on 04/29/2015 01/05/15   Domenic Moras, PA-C  lithium carbonate (ESKALITH) 450 MG CR tablet Take 450 mg by mouth 2 (two) times daily.    Historical Provider, MD  metroNIDAZOLE (FLAGYL) 500 MG tablet Take 1 tablet (500 mg total) by mouth 2 (two) times daily. 05/26/15   Hana Lions, PA-C  naproxen (NAPROSYN) 500 MG tablet Take 1 tablet (500 mg total) by mouth 2 (two) times daily with a meal. Patient not taking: Reported on 04/29/2015 11/30/14   Larene Pickett, PA-C  paliperidone (INVEGA) 9 MG 24 hr tablet Take 9 mg by mouth daily.     Historical Provider, MD  pantoprazole (PROTONIX) 40 MG  tablet Take 1 tablet (40 mg total) by mouth daily. Patient not taking: Reported on 04/29/2015 08/29/14   Dahlia Bailiff, PA-C   BP 105/69 mmHg  Pulse 73  Temp(Src) 98 F (36.7 C) (Oral)  Resp 20  Ht 5\' 5"  (1.651 m)  Wt 53.326 kg  BMI 19.56 kg/m2  SpO2 97% Physical Exam  Constitutional: She appears well-developed and well-nourished. No distress.  HENT:  Head: Normocephalic and atraumatic.  Neck: Neck supple.  Cardiovascular: Normal rate and regular rhythm.   Pulmonary/Chest: Effort normal and breath sounds normal. No respiratory distress. She has no wheezes. She has no rales.  Abdominal: Soft. She exhibits no distension and no mass. There is no tenderness. There is no rebound and no guarding.  Genitourinary: Rectal exam shows  external hemorrhoid. Rectal exam shows no internal hemorrhoid, no mass, no tenderness and anal tone normal.  Soft stool in rectum.  No stool impaction.  No visible stool on glove.  No blood.    Neurological: She is alert.  Skin: She is not diaphoretic.  Nursing note and vitals reviewed.   ED Course  Procedures (including critical care time) Labs Review Labs Reviewed  COMPREHENSIVE METABOLIC PANEL - Abnormal; Notable for the following:    Glucose, Bld 103 (*)    All other components within normal limits  CBC - Abnormal; Notable for the following:    RBC 5.43 (*)    MCV 75.5 (*)    MCH 22.8 (*)    All other components within normal limits  LIPASE, BLOOD  URINALYSIS, ROUTINE W REFLEX MICROSCOPIC (NOT AT [facility])    Imaging Review Dg Abd 1 View  07/22/2015  CLINICAL DATA:  Diffuse abdominal pain for 2-3 hours. EXAM: ABDOMEN - 1 VIEW COMPARISON:  CT abdomen and pelvis 09/03/2014. FINDINGS: Moderate stool burden. RIGHT upper quadrant calcifications could represent gallstones. No obstruction or visible free air. No osseous findings. IMPRESSION: No bowel obstruction or visible free air. See discussion above. Correlate clinically for constipation. Electronically Signed   By: Staci Righter M.D.   On: 07/22/2015 08:42   I have personally reviewed and evaluated these images and lab results as part of my medical decision-making.   EKG Interpretation None      MDM   Final diagnoses:  Constipation, unspecified constipation type    Afebrile, nontoxic patient with abdominal discomfort and constipation.  No other symptoms including systemic symptoms.  Abdominal exam is benign.  Workup remarkable only for xray showing moderate stool burden.  Pt reports she had a small BM in the ED and feels a bit better.  Denies abdominal pain at discharge.  D/C home with constipation medications, PCP follow up.  Discussed result, findings, treatment, and follow up  with patient.  Pt given return precautions.  Pt  verbalizes understanding and agrees with plan.         Clayton Bibles, PA-C 07/22/15 [place], MD 07/23/15 0800

## 2015-07-22 NOTE — ED Notes (Signed)
Pt at xray

## 2015-08-29 ENCOUNTER — Telehealth (HOSPITAL_BASED_OUTPATIENT_CLINIC_OR_DEPARTMENT_OTHER): Payer: Self-pay | Admitting: Emergency Medicine

## 2015-08-29 NOTE — Telephone Encounter (Signed)
Lost to followup, no response to letter 

## 2015-10-02 ENCOUNTER — Emergency Department (HOSPITAL_COMMUNITY)
Admission: EM | Admit: 2015-10-02 | Discharge: 2015-10-02 | Disposition: A | Discharge status: Home / Self Care | Payer: Medicaid Other | Attending: Emergency Medicine | Admitting: Emergency Medicine

## 2015-10-02 ENCOUNTER — Encounter (HOSPITAL_COMMUNITY): Payer: Self-pay

## 2015-10-02 DIAGNOSIS — R519 Headache, unspecified: Secondary | ICD-10-CM

## 2015-10-02 DIAGNOSIS — Z7982 Long term (current) use of aspirin: Secondary | ICD-10-CM | POA: Insufficient documentation

## 2015-10-02 DIAGNOSIS — R51 Headache: Secondary | ICD-10-CM | POA: Diagnosis not present

## 2015-10-02 DIAGNOSIS — F17210 Nicotine dependence, cigarettes, uncomplicated: Secondary | ICD-10-CM | POA: Diagnosis not present

## 2015-10-02 DIAGNOSIS — Y939 Activity, unspecified: Secondary | ICD-10-CM | POA: Diagnosis not present

## 2015-10-02 DIAGNOSIS — Y92049 Unspecified place in boarding-house as the place of occurrence of the external cause: Secondary | ICD-10-CM | POA: Insufficient documentation

## 2015-10-02 DIAGNOSIS — J45909 Unspecified asthma, uncomplicated: Secondary | ICD-10-CM | POA: Diagnosis not present

## 2015-10-02 DIAGNOSIS — Y999 Unspecified external cause status: Secondary | ICD-10-CM | POA: Diagnosis not present

## 2015-10-02 MED ORDER — IBUPROFEN 200 MG PO TABS
600.0000 mg | ORAL_TABLET | Freq: Once | ORAL | Status: AC
  Administered 2015-10-02: 600 mg via ORAL
  Filled 2015-10-02: qty 3

## 2015-10-02 MED ORDER — ACETAMINOPHEN 325 MG PO TABS
650.0000 mg | ORAL_TABLET | Freq: Once | ORAL | Status: AC
  Administered 2015-10-02: 650 mg via ORAL
  Filled 2015-10-02: qty 2
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
## 2015-11-01 ENCOUNTER — Emergency Department (HOSPITAL_COMMUNITY)
Admission: EM | Admit: 2015-11-01 | Discharge: 2015-11-01 | Disposition: A | Discharge status: Home / Self Care | Payer: Medicaid Other | Attending: Emergency Medicine | Admitting: Emergency Medicine

## 2015-11-01 ENCOUNTER — Encounter (HOSPITAL_COMMUNITY): Payer: Self-pay | Admitting: Nurse Practitioner

## 2015-11-01 DIAGNOSIS — J45909 Unspecified asthma, uncomplicated: Secondary | ICD-10-CM | POA: Diagnosis not present

## 2015-11-01 DIAGNOSIS — Z79899 Other long term (current) drug therapy: Secondary | ICD-10-CM | POA: Diagnosis not present

## 2015-11-01 DIAGNOSIS — N760 Acute vaginitis: Secondary | ICD-10-CM | POA: Diagnosis not present

## 2015-11-01 DIAGNOSIS — N390 Urinary tract infection, site not specified: Secondary | ICD-10-CM

## 2015-11-01 DIAGNOSIS — Z5900 Homelessness unspecified: Secondary | ICD-10-CM

## 2015-11-01 DIAGNOSIS — Z7982 Long term (current) use of aspirin: Secondary | ICD-10-CM | POA: Insufficient documentation

## 2015-11-01 DIAGNOSIS — B9689 Other specified bacterial agents as the cause of diseases classified elsewhere: Secondary | ICD-10-CM

## 2015-11-01 DIAGNOSIS — F17210 Nicotine dependence, cigarettes, uncomplicated: Secondary | ICD-10-CM | POA: Insufficient documentation

## 2015-11-01 DIAGNOSIS — R102 Pelvic and perineal pain: Secondary | ICD-10-CM | POA: Diagnosis present

## 2015-11-01 LAB — URINALYSIS, ROUTINE W REFLEX MICROSCOPIC
Bilirubin Urine: NEGATIVE
Glucose, UA: NEGATIVE mg/dL
Hgb urine dipstick: NEGATIVE
Ketones, ur: NEGATIVE mg/dL
Nitrite: NEGATIVE
Protein, ur: NEGATIVE mg/dL
Specific Gravity, Urine: 1.01 (ref 1.005–1.030)
pH: 6.5 (ref 5.0–8.0)

## 2015-11-01 LAB — URINE MICROSCOPIC-ADD ON

## 2015-11-01 LAB — WET PREP, GENITAL
Sperm: NONE SEEN
Trich, Wet Prep: NONE SEEN
Yeast Wet Prep HPF POC: NONE SEEN

## 2015-11-01 MED ORDER — METRONIDAZOLE 500 MG PO TABS
500.0000 mg | ORAL_TABLET | Freq: Once | ORAL | Status: AC
  Administered 2015-11-01: 500 mg via ORAL
  Filled 2015-11-01: qty 1

## 2015-11-01 MED ORDER — METRONIDAZOLE 500 MG PO TABS: 500.0000 mg | ORAL_TABLET | Freq: Two times a day (BID) | ORAL | Status: DC

## 2015-11-01 MED ORDER — CIPROFLOXACIN HCL 500 MG PO TABS: 500.0000 mg | ORAL_TABLET | Freq: Two times a day (BID) | ORAL | Status: DC

## 2015-11-01 MED ORDER — CIPROFLOXACIN HCL 500 MG PO TABS
500.0000 mg | ORAL_TABLET | Freq: Once | ORAL | Status: AC
  Administered 2015-11-01: 500 mg via ORAL
  Filled 2015-11-01: qty 1

## 2015-11-01 MED ORDER — AZITHROMYCIN 1 G PO PACK
1.0000 g | PACK | Freq: Once | ORAL | Status: AC
  Administered 2015-11-01: 1 g via ORAL
  Filled 2015-11-01: qty 1

## 2015-11-01 MED ORDER — CEFTRIAXONE SODIUM 250 MG IJ SOLR
250.0000 mg | Freq: Once | INTRAMUSCULAR | Status: AC
  Administered 2015-11-01: 250 mg via INTRAMUSCULAR
  Filled 2015-11-01: qty 250

## 2015-11-01 MED ORDER — STERILE WATER FOR INJECTION IJ SOLN
INTRAMUSCULAR | Status: AC
  Administered 2015-11-01: 10 mL
  Filled 2015-11-01: qty 10

## 2015-11-01 NOTE — ED Provider Notes (Signed)
CSN: [id number]     Arrival date & time 11/01/15  1444 History   First MD Initiated Contact with Patient 11/01/15 1721     Chief Complaint  Patient presents with  . Vaginal Pain     (Consider location/radiation/quality/duration/timing/severity/associated sxs/prior Treatment) The history is provided by the patient.  Karen Best is a 60 y.o. female history of fibromyalgia, schizophrenia, bipolar who presented with vaginal discharge. Patient lives at a boarding house and is sexually active. Patient has vaginal discharge for the last week or so, it is whitish and foul-smelling. Had unprotected sex about a week ago. Denies any vomiting or fevers. Has some mild dysuria as well.    Past Medical History  Diagnosis Date  . Fibromyalgia   . Schizophrenia ([place])   . Asthma   . Bipolar 1 disorder ([place])   . Tobacco abuse    Past Surgical History  Procedure Laterality Date  . Cesarean section    . Tubal ligation     History reviewed. No pertinent family history. Social History  Substance Use Topics  . Smoking status: Current Every Day Smoker -- 0.50 packs/day    Types: Cigarettes  . Smokeless tobacco: Never Used  . Alcohol Use: Yes     Comment: occ   OB History    No data available     Review of Systems  Genitourinary: Positive for vaginal discharge and vaginal pain.  All other systems reviewed and are negative.     Allergies  Penicillins  Home Medications   Prior to Admission medications   Medication Sig Start Date End Date Taking? Authorizing Provider  aspirin 81 MG tablet Take 81 mg by mouth daily.    Historical Provider, MD  beclomethasone (QVAR) 40 MCG/ACT inhaler Inhale 2 puffs into the lungs 2 (two) times daily.    Historical Provider, MD  benztropine (COGENTIN) 1 MG tablet Take 1 mg by mouth 2 (two) times daily.    Historical Provider, MD  cephALEXin (KEFLEX) 500 MG capsule Take 1 capsule (500 mg total) by mouth 2 (two) times daily. 05/26/15   [place] Lions, PA-C  cephALEXin (KEFLEX) 500 MG capsule Take 1 capsule (500 mg total) by mouth 3 (three) times daily. 07/18/15   Olivia Canter Sam, PA-C  ciprofloxacin (CIPRO) 500 MG tablet Take 1 tablet (500 mg total) by mouth 2 (two) times daily. Patient not taking: Reported on 04/29/2015 08/15/14   Delos Haring, PA-C  docusate sodium (COLACE) 100 MG capsule Take 1 capsule (100 mg total) by mouth every 12 (twelve) hours. Patient not taking: Reported on 04/29/2015 09/03/14   Fredia Sorrow, MD  ibuprofen (ADVIL,MOTRIN) 200 MG tablet Take 2 tablets (400 mg total) by mouth every 6 (six) hours as needed for moderate pain. Patient not taking: Reported on 04/29/2015 01/05/15   Domenic Moras, PA-C  lithium carbonate (ESKALITH) 450 MG CR tablet Take 450 mg by mouth 2 (two) times daily.    Historical Provider, MD  metroNIDAZOLE (FLAGYL) 500 MG tablet Take 1 tablet (500 mg total) by mouth 2 (two) times daily. 05/26/15   [place] Lions, PA-C  naproxen (NAPROSYN) 500 MG tablet Take 1 tablet (500 mg total) by mouth 2 (two) times daily with a meal. Patient not taking: Reported on 04/29/2015 11/30/14   Larene Pickett, PA-C  paliperidone (INVEGA) 9 MG 24 hr tablet Take 9 mg by mouth daily.     Historical Provider, MD  pantoprazole (PROTONIX) 40 MG tablet Take 1 tablet (40 mg total)  by mouth daily. Patient not taking: Reported on 04/29/2015 08/29/14   Dahlia Bailiff, PA-C  polyethylene glycol (MIRALAX / Floria Raveling) packet Take 17 g by mouth daily as needed for mild constipation or moderate constipation. 07/22/15   Clayton Bibles, PA-C   BP 171/85 mmHg  Pulse 51  Temp(Src) 98.4 F (36.9 C) (Oral)  Resp 20  Ht 5\' 5"  (1.651 m)  Wt 115 lb (52.164 kg)  BMI 19.14 kg/m2  SpO2 100% Physical Exam  Constitutional: She is oriented to person, place, and time. She appears well-developed and well-nourished.  HENT:  Head: Normocephalic.  Mouth/Throat: Oropharynx is clear and moist.  Eyes: Conjunctivae are normal. Pupils are equal, round, and reactive to  light.  Neck: Normal range of motion.  Cardiovascular: Normal rate, regular rhythm and normal heart sounds.   Pulmonary/Chest: Effort normal and breath sounds normal. No respiratory distress. She has no wheezes. She has no rales.  Abdominal: Soft. Bowel sounds are normal. She exhibits no distension. There is no tenderness. There is no rebound.  No CVAT   Genitourinary:  Whitish discharge, no CMT or uterine or adnexal tenderness   Musculoskeletal: Normal range of motion.  Neurological: She is alert and oriented to person, place, and time.  Skin: Skin is warm and dry.  Psychiatric: She has a normal mood and affect. Her behavior is normal. Judgment and thought content normal.  Nursing note and vitals reviewed.   ED Course  Procedures (including critical care time) Labs Review Labs Reviewed  WET PREP, GENITAL - Abnormal; Notable for the following:    Clue Cells Wet Prep HPF POC PRESENT (*)    WBC, Wet Prep HPF POC MODERATE (*)    All other components within normal limits  URINALYSIS, ROUTINE W REFLEX MICROSCOPIC (NOT AT [facility]) - Abnormal; Notable for the following:    APPearance CLOUDY (*)    Leukocytes, UA MODERATE (*)    All other components within normal limits  URINE MICROSCOPIC-ADD ON - Abnormal; Notable for the following:    Squamous Epithelial / LPF 0-5 (*)    Bacteria, UA MANY (*)    All other components within normal limits  URINE CULTURE  RPR  HIV ANTIBODY (ROUTINE TESTING)  GC/CHLAMYDIA PROBE AMP ([place]) NOT AT [facility]    Imaging Review No results found. I have personally reviewed and evaluated these images and lab results as part of my medical decision-making.   EKG Interpretation None      MDM   Final diagnoses:  None   Karen Best is a 60 y.o. female here with vaginal discharge, dysuria. She is sexually active. Has whitish discharge from vagina, no CMT or adnexal tenderness. Afebrile, not tachy or hypotensive. I doubt PID or tubo ovarian abscess.  Wet preop + BV, given flagyl. UA + UTI, unclear true UTI or from STDs. Given cipro empirically and ordered urine culture. GC/chlamydia sent, treat empirically with ceftriaxone and azithromycin. Well appearing, will dc home with cipro, flagyl      Wandra Arthurs, MD 11/01/15 1919
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
## 2015-11-01 NOTE — Discharge Instructions (Signed)
Take cipro and flagyl as prescribed.   See your doctor.   Practice safe sex. Use a condom.   Return to ER if you have worse vaginal discharge, abdominal pain, vomiting, fever.

## 2015-11-01 NOTE — ED Notes (Signed)
Pt departed in NAD.  

## 2015-11-02 LAB — GC/CHLAMYDIA PROBE AMP (~~LOC~~) NOT AT ARMC
Chlamydia: NEGATIVE
Neisseria Gonorrhea: NEGATIVE

## 2015-11-02 LAB — URINE CULTURE

## 2015-11-02 LAB — HIV ANTIBODY (ROUTINE TESTING W REFLEX): HIV Screen 4th Generation wRfx: NONREACTIVE

## 2015-11-02 LAB — RPR: RPR Ser Ql: NONREACTIVE

## 2015-11-08 DIAGNOSIS — Z5900 Homelessness unspecified: Secondary | ICD-10-CM

## 2015-11-26 ENCOUNTER — Emergency Department (HOSPITAL_COMMUNITY): Payer: Medicaid Other

## 2015-11-26 ENCOUNTER — Encounter (HOSPITAL_COMMUNITY): Payer: Self-pay | Admitting: Emergency Medicine

## 2015-11-26 ENCOUNTER — Emergency Department (HOSPITAL_COMMUNITY)
Admission: EM | Admit: 2015-11-26 | Discharge: 2015-11-26 | Disposition: A | Discharge status: Home / Self Care | Payer: Medicaid Other | Attending: Emergency Medicine | Admitting: Emergency Medicine

## 2015-11-26 DIAGNOSIS — J45909 Unspecified asthma, uncomplicated: Secondary | ICD-10-CM | POA: Diagnosis not present

## 2015-11-26 DIAGNOSIS — F1410 Cocaine abuse, uncomplicated: Secondary | ICD-10-CM | POA: Diagnosis not present

## 2015-11-26 DIAGNOSIS — R0789 Other chest pain: Secondary | ICD-10-CM | POA: Diagnosis present

## 2015-11-26 DIAGNOSIS — F17210 Nicotine dependence, cigarettes, uncomplicated: Secondary | ICD-10-CM | POA: Insufficient documentation

## 2015-11-26 DIAGNOSIS — Z7982 Long term (current) use of aspirin: Secondary | ICD-10-CM | POA: Diagnosis not present

## 2015-11-26 DIAGNOSIS — D649 Anemia, unspecified: Secondary | ICD-10-CM | POA: Diagnosis not present

## 2015-11-26 LAB — I-STAT TROPONIN, ED
Troponin i, poc: 0 ng/mL (ref 0.00–0.08)
Troponin i, poc: 0 ng/mL (ref 0.00–0.08)

## 2015-11-26 LAB — COMPREHENSIVE METABOLIC PANEL
ALT: 17 U/L (ref 14–54)
AST: 22 U/L (ref 15–41)
Albumin: 3.4 g/dL — ABNORMAL LOW (ref 3.5–5.0)
Alkaline Phosphatase: 46 U/L (ref 38–126)
Anion gap: 4 — ABNORMAL LOW (ref 5–15)
BUN: <5 mg/dL — ABNORMAL LOW (ref 6–20)
CO2: 24 mmol/L (ref 22–32)
Calcium: 9 mg/dL (ref 8.9–10.3)
Chloride: 105 mmol/L (ref 101–111)
Creatinine, Ser: 0.65 mg/dL (ref 0.44–1.00)
GFR calc Af Amer: >60 mL/min (ref >60)
GFR calc non Af Amer: >60 mL/min (ref >60)
Glucose, Bld: 96 mg/dL (ref 65–99)
Potassium: 3.9 mmol/L (ref 3.5–5.1)
Sodium: 133 mmol/L — ABNORMAL LOW (ref 135–145)
Total Bilirubin: 0.3 mg/dL (ref 0.3–1.2)
Total Protein: 7 g/dL (ref 6.5–8.1)

## 2015-11-26 LAB — CBC
HCT: 36 % (ref 36.0–46.0)
Hemoglobin: 11 g/dL — ABNORMAL LOW (ref 12.0–15.0)
MCH: 22.7 pg — ABNORMAL LOW (ref 26.0–34.0)
MCHC: 30.6 g/dL (ref 30.0–36.0)
MCV: 74.4 fL — ABNORMAL LOW (ref 78.0–100.0)
Platelets: 267 10*3/uL (ref 150–400)
RBC: 4.84 MIL/uL (ref 3.87–5.11)
RDW: 15.6 % — ABNORMAL HIGH (ref 11.5–15.5)
WBC: 3.9 10*3/uL — ABNORMAL LOW (ref 4.0–10.5)

## 2015-11-26 MED ORDER — ACETAMINOPHEN 325 MG PO TABS
650.0000 mg | ORAL_TABLET | Freq: Once | ORAL | Status: AC
  Administered 2015-11-26: 650 mg via ORAL
  Filled 2015-11-26: qty 2

## 2015-11-26 NOTE — ED Notes (Signed)
Pt. Ambulated to bathroom independently with steady gait, no complaints of pain.

## 2015-11-26 NOTE — ED Notes (Signed)
Patient reports mid chest pain awakened her from sleep. No radiation. Denies chest pain at this time.
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
## 2015-11-26 NOTE — ED Notes (Signed)
Pt. Requesting to be discharged, MD made aware.

## 2015-11-26 NOTE — ED Notes (Signed)
Pt given coke per [place]-RN.

## 2015-11-26 NOTE — ED Notes (Signed)
Coke given to pt, per [employer]

## 2015-11-26 NOTE — Discharge Instructions (Signed)
Call the [facility] to get a primary care physician. Ask your new primary care physician to help you to stop smoking. Avoid cocaine as cocaine causes heart attacks and strokes.
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
## 2015-11-30 NOTE — Congregational Nurse Program (Signed)
Congregational Nurse Program Note  Date of Encounter: 11/08/2015  Past Medical History: Past Medical History:  Diagnosis Date  . Asthma   . Bipolar 1 disorder ([place])   . Fibromyalgia   . Schizophrenia ([place])   . Tobacco abuse     Encounter Details:     CNP Questionnaire - 11/08/15 1849      Patient Demographics   Is this a new or existing patient? Existing   Patient is considered a/an Not Applicable   Race African-American/Black     Patient Assistance   Location of Patient Assistance Not Applicable   Patient's financial/insurance status Medicaid;Low Income   Uninsured Patient No   Patient referred to apply for the following financial assistance Not Applicable   Food insecurities addressed Provided food supplies   Transportation assistance No   Assistance securing medications No   Educational health offerings Acute disease;Navigating the healthcare system     Encounter Details   Primary purpose of visit Education/Health Concerns;Acute Illness/Condition Visit;Navigating the Healthcare System   Was an Emergency Department visit averted? No   Does patient have a medical provider? Yes   Patient referred to Follow up with established PCP   Was a mental health screening completed? (GAINS tool) No   Does patient have dental issues? No   Does patient have vision issues? No   Does your patient have an abnormal blood pressure today? No   Since previous encounter, have you referred patient for abnormal blood pressure that resulted in a new diagnosis or medication change? No   Does your patient have an abnormal blood glucose today? No   Since previous encounter, have you referred patient for abnormal blood glucose that resulted in a new diagnosis or medication change? No   Was there a life-saving intervention made? No     Requested buss passes to go to the ED.  Was not specific as to what she needed from the ED.  Did not follow through with her primary care provider.  Discussed with  her the importance of seeing her PCP

## 2015-11-30 NOTE — Congregational Nurse Program (Signed)
Congregational Nurse Program Note  Date of Encounter: 11/01/2015  Past Medical History: Past Medical History:  Diagnosis Date  . Asthma   . Bipolar 1 disorder ([place])   . Fibromyalgia   . Schizophrenia ([place])   . Tobacco abuse     Encounter Details:     CNP Questionnaire - 11/01/15 1718      Patient Demographics   Is this a new or existing patient? New   Patient is considered a/an Not Applicable   Race African-American/Black     Patient Assistance   Location of Patient Assistance Not Applicable   Patient's financial/insurance status Medicaid;Low Income   Uninsured Patient No   Patient referred to apply for the following financial assistance Not Applicable   Food insecurities addressed Provided food supplies   Transportation assistance No   Assistance securing medications No   Educational health offerings Acute disease;Navigating the healthcare system     Encounter Details   Primary purpose of visit Education/Health Concerns;Acute Illness/Condition Visit;Navigating the Healthcare System   Was an Emergency Department visit averted? No   Does patient have a medical provider? Yes   Patient referred to Follow up with established PCP   Was a mental health screening completed? (GAINS tool) No   Does patient have dental issues? No   Does patient have vision issues? No   Does your patient have an abnormal blood pressure today? No   Since previous encounter, have you referred patient for abnormal blood pressure that resulted in a new diagnosis or medication change? No   Does your patient have an abnormal blood glucose today? No   Since previous encounter, have you referred patient for abnormal blood glucose that resulted in a new diagnosis or medication change? No   Was there a life-saving intervention made? No      Requesting buss passes to go the emergency department.  States has a vaginal discharge.  Discussed with her the need for her to make an appointment with her provider  ([facility]).  That although a vaginal discharge was uncomfortable it was not an emergency.  Buss passes denied

## 2015-12-01 ENCOUNTER — Emergency Department (HOSPITAL_COMMUNITY): Payer: Medicaid Other

## 2015-12-01 ENCOUNTER — Encounter (HOSPITAL_COMMUNITY): Payer: Self-pay | Admitting: *Deleted

## 2015-12-01 ENCOUNTER — Emergency Department (HOSPITAL_COMMUNITY)
Admission: EM | Admit: 2015-12-01 | Discharge: 2015-12-01 | Disposition: A | Discharge status: Home / Self Care | Payer: Medicaid Other | Attending: Emergency Medicine | Admitting: Emergency Medicine

## 2015-12-01 DIAGNOSIS — Z202 Contact with and (suspected) exposure to infections with a predominantly sexual mode of transmission: Secondary | ICD-10-CM | POA: Insufficient documentation

## 2015-12-01 DIAGNOSIS — Y999 Unspecified external cause status: Secondary | ICD-10-CM | POA: Diagnosis not present

## 2015-12-01 DIAGNOSIS — R102 Pelvic and perineal pain: Secondary | ICD-10-CM | POA: Diagnosis present

## 2015-12-01 DIAGNOSIS — J45909 Unspecified asthma, uncomplicated: Secondary | ICD-10-CM | POA: Insufficient documentation

## 2015-12-01 DIAGNOSIS — Y92039 Unspecified place in apartment as the place of occurrence of the external cause: Secondary | ICD-10-CM | POA: Insufficient documentation

## 2015-12-01 DIAGNOSIS — Y939 Activity, unspecified: Secondary | ICD-10-CM | POA: Diagnosis not present

## 2015-12-01 DIAGNOSIS — F17210 Nicotine dependence, cigarettes, uncomplicated: Secondary | ICD-10-CM | POA: Diagnosis not present

## 2015-12-01 DIAGNOSIS — R51 Headache: Secondary | ICD-10-CM | POA: Diagnosis not present

## 2015-12-01 LAB — WET PREP, GENITAL
Clue Cells Wet Prep HPF POC: NONE SEEN
Sperm: NONE SEEN
Trich, Wet Prep: NONE SEEN
WBC, Wet Prep HPF POC: NONE SEEN
Yeast Wet Prep HPF POC: NONE SEEN
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
## 2015-12-01 NOTE — ED Notes (Signed)
Pt returned from CT °

## 2015-12-01 NOTE — ED Notes (Signed)
Pt informed she needs to collect urine sample. Pelvic cart at bedside.

## 2015-12-03 LAB — GC/CHLAMYDIA PROBE AMP (~~LOC~~) NOT AT ARMC
Chlamydia: NEGATIVE
Neisseria Gonorrhea: NEGATIVE

## 2015-12-12 ENCOUNTER — Emergency Department (HOSPITAL_COMMUNITY)
Admission: EM | Admit: 2015-12-12 | Discharge: 2015-12-12 | Disposition: A | Discharge status: Home / Self Care | Payer: Medicaid Other | Attending: Emergency Medicine | Admitting: Emergency Medicine

## 2015-12-12 ENCOUNTER — Encounter (HOSPITAL_COMMUNITY): Payer: Self-pay | Admitting: Emergency Medicine

## 2015-12-12 DIAGNOSIS — R079 Chest pain, unspecified: Secondary | ICD-10-CM | POA: Diagnosis present

## 2015-12-12 DIAGNOSIS — J45909 Unspecified asthma, uncomplicated: Secondary | ICD-10-CM | POA: Insufficient documentation

## 2015-12-12 DIAGNOSIS — F17210 Nicotine dependence, cigarettes, uncomplicated: Secondary | ICD-10-CM | POA: Diagnosis not present

## 2015-12-12 DIAGNOSIS — R0789 Other chest pain: Secondary | ICD-10-CM | POA: Diagnosis not present

## 2015-12-12 LAB — TROPONIN I: Troponin I: <0.03 ng/mL (ref <0.03)

## 2015-12-12 LAB — BASIC METABOLIC PANEL
Anion gap: 3 — ABNORMAL LOW (ref 5–15)
BUN: <5 mg/dL — ABNORMAL LOW (ref 6–20)
CO2: 26 mmol/L (ref 22–32)
Calcium: 9.5 mg/dL (ref 8.9–10.3)
Chloride: 107 mmol/L (ref 101–111)
Creatinine, Ser: 0.81 mg/dL (ref 0.44–1.00)
GFR calc Af Amer: >60 mL/min (ref >60)
GFR calc non Af Amer: >60 mL/min (ref >60)
Glucose, Bld: 93 mg/dL (ref 65–99)
Potassium: 3.7 mmol/L (ref 3.5–5.1)
Sodium: 136 mmol/L (ref 135–145)

## 2015-12-12 LAB — CBC WITH DIFFERENTIAL/PLATELET
Basophils Absolute: 0 10*3/uL (ref 0.0–0.1)
Basophils Relative: 0 %
Eosinophils Absolute: 0.5 10*3/uL (ref 0.0–0.7)
Eosinophils Relative: 11 %
HCT: 35.8 % — ABNORMAL LOW (ref 36.0–46.0)
Hemoglobin: 10.8 g/dL — ABNORMAL LOW (ref 12.0–15.0)
Lymphocytes Relative: 47 %
Lymphs Abs: 2.1 10*3/uL (ref 0.7–4.0)
MCH: 22.7 pg — ABNORMAL LOW (ref 26.0–34.0)
MCHC: 30.2 g/dL (ref 30.0–36.0)
MCV: 75.4 fL — ABNORMAL LOW (ref 78.0–100.0)
Monocytes Absolute: 0.2 10*3/uL (ref 0.1–1.0)
Monocytes Relative: 5 %
Neutro Abs: 1.7 10*3/uL (ref 1.7–7.7)
Neutrophils Relative %: 37 %
Platelets: 257 10*3/uL (ref 150–400)
RBC: 4.75 MIL/uL (ref 3.87–5.11)
RDW: 15.3 % (ref 11.5–15.5)
WBC: 4.6 10*3/uL (ref 4.0–10.5)

## 2015-12-12 MED ORDER — FAMOTIDINE 20 MG PO TABS: 20.0000 mg | ORAL_TABLET | Freq: Two times a day (BID) | ORAL | 0 refills | Status: DC

## 2015-12-12 MED ORDER — FAMOTIDINE 20 MG PO TABS
20.0000 mg | ORAL_TABLET | Freq: Once | ORAL | Status: AC
  Administered 2015-12-12: 20 mg via ORAL
  Filled 2015-12-12: qty 1

## 2015-12-12 NOTE — ED Notes (Signed)
Pt c/o chest pain -- 8/10. No change in VS.

## 2015-12-12 NOTE — ED Provider Notes (Signed)
[facility] DEPT Provider Note   CSN: [id number] Arrival date & time: 12/12/15  [id number]     History   Chief Complaint Chief Complaint  Patient presents with  . Chest Pain    HPI Karen Best is a 60 y.o. female.  HPI   14yF with CP. Onset last night. Has had re occuring pain which "only comes to get me at night." Now currently resolved. She walked from her residence at a boarding house to the [facility] this morning to get a ride to the ED. She reports the walk is about 5 minutes and reports no change in her symptoms with this. "I didn't pay it no mind." Denies associates symptoms such as dyspnea, n/v, diaphoresis, etc. No unusual leg pain or swelling. Reports family history of "heart problems and strokes" but no personal cardiac history. She is a smoker.   Past Medical History:  Diagnosis Date  . Asthma   . Bipolar 1 disorder ([place])   . Fibromyalgia   . Schizophrenia ([place])   . Tobacco abuse     Patient Active Problem List   Diagnosis Date Noted  . Psychosis 09/16/2013  . Schizophrenia [facility])     Past Surgical History:  Procedure Laterality Date  . CESAREAN SECTION    . TUBAL LIGATION      OB History    No data available       Home Medications    Prior to Admission medications   Medication Sig Start Date End Date Taking? Authorizing Provider  beclomethasone (QVAR) 40 MCG/ACT inhaler Inhale 2 puffs into the lungs 2 (two) times daily.    Historical Provider, MD  benztropine (COGENTIN) 1 MG tablet Take 1 mg by mouth 2 (two) times daily.    Historical Provider, MD  cephALEXin (KEFLEX) 500 MG capsule Take 1 capsule (500 mg total) by mouth 2 (two) times daily. 05/26/15   [place] Lions, PA-C  cephALEXin (KEFLEX) 500 MG capsule Take 1 capsule (500 mg total) by mouth 3 (three) times daily. 07/18/15   Olivia Canter Sam, PA-C  ciprofloxacin (CIPRO) 500 MG tablet Take 1 tablet (500 mg total) by mouth 2 (two) times daily. 11/01/15   Drenda Freeze, MD  docusate  sodium (COLACE) 100 MG capsule Take 1 capsule (100 mg total) by mouth every 12 (twelve) hours. Patient not taking: Reported on 04/29/2015 09/03/14   Fredia Sorrow, MD  ibuprofen (ADVIL,MOTRIN) 200 MG tablet Take 2 tablets (400 mg total) by mouth every 6 (six) hours as needed for moderate pain. 01/05/15   Domenic Moras, PA-C  lithium carbonate (ESKALITH) 450 MG CR tablet Take 450 mg by mouth 2 (two) times daily.    Historical Provider, MD  metroNIDAZOLE (FLAGYL) 500 MG tablet Take 1 tablet (500 mg total) by mouth 2 (two) times daily. 11/01/15   Drenda Freeze, MD  naproxen (NAPROSYN) 500 MG tablet Take 1 tablet (500 mg total) by mouth 2 (two) times daily with a meal. Patient taking differently: Take 500 mg by mouth 2 (two) times daily as needed for mild pain.  11/30/14   Larene Pickett, PA-C  paliperidone (INVEGA) 9 MG 24 hr tablet Take 9 mg by mouth daily.     Historical Provider, MD  pantoprazole (PROTONIX) 40 MG tablet Take 1 tablet (40 mg total) by mouth daily. Patient not taking: Reported on 04/29/2015 08/29/14   Dahlia Bailiff, PA-C  polyethylene glycol (MIRALAX / GLYCOLAX) packet Take 17 g by mouth daily as needed for mild  constipation or moderate constipation. 07/22/15   Clayton Bibles, PA-C    Family History No family history on file.  Social History Social History  Substance Use Topics  . Smoking status: Current Every Day Smoker    Packs/day: 0.50    Types: Cigarettes  . Smokeless tobacco: Never Used  . Alcohol use Yes     Comment: occ     Allergies   Penicillins   Review of Systems Review of Systems  All systems reviewed and negative, other than as noted in HPI.   Physical Exam Updated Vital Signs BP 111/72 (BP Location: Right Arm)   Pulse (!) 57   Temp 97.3 F (36.3 C) (Oral)   Resp 18   Wt 103 lb (46.7 kg)   SpO2 100%   BMI 17.14 kg/m   Physical Exam  Constitutional: She appears well-developed and well-nourished. No distress.  HENT:  Head: Normocephalic and atraumatic.    Eyes: Conjunctivae are normal. Right eye exhibits no discharge. Left eye exhibits no discharge.  Neck: Neck supple.  Cardiovascular: Normal rate, regular rhythm and normal heart sounds.  Exam reveals no gallop and no friction rub.   No murmur heard. Pulmonary/Chest: Effort normal and breath sounds normal. No respiratory distress.  Abdominal: Soft. She exhibits no distension. There is no tenderness.  Musculoskeletal: She exhibits no edema or tenderness.  Lower extremities symmetric as compared to each other. No calf tenderness. Negative Homan's. No palpable cords.   Neurological: She is alert.  Skin: Skin is warm and dry.  Psychiatric: She has a normal mood and affect. Her behavior is normal. Thought content normal.  Nursing note and vitals reviewed.    ED Treatments / Results  Labs (all labs ordered are listed, but only abnormal results are displayed) Labs Reviewed  CBC WITH DIFFERENTIAL/PLATELET - Abnormal; Notable for the following:       Result Value   Hemoglobin 10.8 (*)    HCT 35.8 (*)    MCV 75.4 (*)    MCH 22.7 (*)    All other components within normal limits  BASIC METABOLIC PANEL - Abnormal; Notable for the following:    BUN <5 (*)    Anion gap 3 (*)    All other components within normal limits  TROPONIN I    EKG  EKG Interpretation  Date/Time:  Wednesday December 12 2015 07:22:14 EDT Ventricular Rate:  56 PR Interval:    QRS Duration: 95 QT Interval:  442 QTC Calculation: 427 R Axis:   3 Text Interpretation:  Sinus rhythm Abnormal R-wave progression, early transition Minimal ST elevation, anterior leads No significant change since last tracing Confirmed by Wilson Singer  MD, Daxx Tiggs ([id number]) on 12/12/2015 7:57:57 AM       Radiology No results found.  Procedures Procedures (including critical care time)  Medications Ordered in ED Medications  famotidine (PEPCID) tablet 20 mg (20 mg Oral Given 12/12/15 [id number])     Initial Impression / Assessment and Plan / ED  Course  I have reviewed the triage vital signs and the nursing notes.  Pertinent labs & imaging results that were available during my care of the patient were reviewed by me and considered in my medical decision making (see chart for details).  Clinical Course   60yF with CP. Atypical for ACS. Doubt PE, dissection, or other emergent process. Symptoms now currently resolved. EKG unchanged from previous. ED w/u unremarkable. GERD with symptoms only at night? Will give trial of pepcid.   Final Clinical Impressions(s) /  ED Diagnoses   Final diagnoses:  Nonspecific chest pain    New Prescriptions New Prescriptions   No medications on file     Virgel Manifold, MD 12/16/15 1127

## 2015-12-12 NOTE — ED Triage Notes (Signed)
[facility] from [facility], with c/o chest pain, dull substernal -- nonradiating, pt is pain free on arrival-- received NTG x1 sl, ASA 324mg ,  Has IV per EMS left AC -- 18g.

## 2015-12-21 ENCOUNTER — Encounter (HOSPITAL_COMMUNITY): Payer: Self-pay | Admitting: Emergency Medicine

## 2015-12-21 ENCOUNTER — Emergency Department (HOSPITAL_COMMUNITY)
Admission: EM | Admit: 2015-12-21 | Discharge: 2015-12-21 | Disposition: A | Discharge status: Home / Self Care | Payer: Medicaid Other | Attending: Emergency Medicine | Admitting: Emergency Medicine

## 2015-12-21 DIAGNOSIS — Z79899 Other long term (current) drug therapy: Secondary | ICD-10-CM | POA: Diagnosis not present

## 2015-12-21 DIAGNOSIS — B9689 Other specified bacterial agents as the cause of diseases classified elsewhere: Secondary | ICD-10-CM

## 2015-12-21 DIAGNOSIS — J45909 Unspecified asthma, uncomplicated: Secondary | ICD-10-CM | POA: Diagnosis not present

## 2015-12-21 DIAGNOSIS — Z7982 Long term (current) use of aspirin: Secondary | ICD-10-CM | POA: Insufficient documentation

## 2015-12-21 DIAGNOSIS — F1490 Cocaine use, unspecified, uncomplicated: Secondary | ICD-10-CM | POA: Insufficient documentation

## 2015-12-21 DIAGNOSIS — N760 Acute vaginitis: Secondary | ICD-10-CM | POA: Diagnosis not present

## 2015-12-21 DIAGNOSIS — N390 Urinary tract infection, site not specified: Secondary | ICD-10-CM | POA: Insufficient documentation

## 2015-12-21 DIAGNOSIS — Z791 Long term (current) use of non-steroidal anti-inflammatories (NSAID): Secondary | ICD-10-CM | POA: Insufficient documentation

## 2015-12-21 DIAGNOSIS — F17210 Nicotine dependence, cigarettes, uncomplicated: Secondary | ICD-10-CM | POA: Diagnosis not present

## 2015-12-21 DIAGNOSIS — R300 Dysuria: Secondary | ICD-10-CM | POA: Diagnosis present

## 2015-12-21 LAB — WET PREP, GENITAL
Sperm: NONE SEEN
Trich, Wet Prep: NONE SEEN
Yeast Wet Prep HPF POC: NONE SEEN

## 2015-12-21 LAB — URINALYSIS, ROUTINE W REFLEX MICROSCOPIC
Bilirubin Urine: NEGATIVE
Glucose, UA: NEGATIVE mg/dL
Hgb urine dipstick: NEGATIVE
Ketones, ur: NEGATIVE mg/dL
Nitrite: NEGATIVE
Protein, ur: NEGATIVE mg/dL
Specific Gravity, Urine: 1.006 (ref 1.005–1.030)
pH: 7 (ref 5.0–8.0)

## 2015-12-21 LAB — URINE MICROSCOPIC-ADD ON

## 2015-12-21 MED ORDER — CEPHALEXIN 500 MG PO CAPS: 500.0000 mg | ORAL_CAPSULE | Freq: Three times a day (TID) | ORAL | 0 refills | Status: DC

## 2015-12-21 MED ORDER — NITROFURANTOIN MONOHYD MACRO 100 MG PO CAPS: 100.0000 mg | ORAL_CAPSULE | Freq: Two times a day (BID) | ORAL | 0 refills | Status: DC

## 2015-12-21 MED ORDER — METRONIDAZOLE 500 MG PO TABS: 500.0000 mg | ORAL_TABLET | Freq: Two times a day (BID) | ORAL | 0 refills | Status: DC

## 2015-12-21 NOTE — ED Triage Notes (Signed)
Per [facility]. Pt reports generalized body pain, vaginal pain, vaginal discharge and dysuria for the past week since been seen at [facility] last week.

## 2015-12-21 NOTE — ED Notes (Signed)
Bed: WA19 Expected date:  Expected time:  Means of arrival:  Comments: 

## 2015-12-21 NOTE — Discharge Instructions (Signed)
Take antibiotics as prescribed. Return without fail for worsening symptoms, including fever, vomiting, severe abdominal pain or any other symptoms concerning to you.

## 2015-12-21 NOTE — ED Provider Notes (Signed)
[facility] DEPT Provider Note   CSN: [id number] Arrival date & time: 12/21/15  1049     History   Chief Complaint Chief Complaint  Patient presents with  . Generalized Body Aches  . Vaginal Pain    HPI Karen Best is a 60 y.o. female.  HPI 60 year old female who presents with dysuria and vaginal discharge. History of bipolar disorder, schizophrenia, asthma, and fibromyalgia. One week of burning with urination. States that when she stands up a greyish discharge comes "pouring out" her vagina. No frequency of urine, hematuria, nausea, vomiting, diarrhea, abdominal pain, fever or chills. C/o concurrent low back pain. Denies that she is currently sexually active.  Past Medical History:  Diagnosis Date  . Asthma   . Bipolar 1 disorder ([place])   . Fibromyalgia   . Schizophrenia ([place])   . Tobacco abuse     Patient Active Problem List   Diagnosis Date Noted  . Psychosis 09/16/2013  . Schizophrenia [facility])     Past Surgical History:  Procedure Laterality Date  . CESAREAN SECTION    . TUBAL LIGATION      OB History    No data available       Home Medications    Prior to Admission medications   Medication Sig Start Date End Date Taking? Authorizing Provider  aspirin EC 81 MG tablet Take 81 mg by mouth daily.   Yes Historical Provider, MD  beclomethasone (QVAR) 40 MCG/ACT inhaler Inhale 2 puffs into the lungs 2 (two) times daily.   Yes Historical Provider, MD  benztropine (COGENTIN) 1 MG tablet Take 1 mg by mouth 2 (two) times daily as needed for tremors.    Yes Historical Provider, MD  ibuprofen (ADVIL,MOTRIN) 200 MG tablet Take 2 tablets (400 mg total) by mouth every 6 (six) hours as needed for moderate pain. 01/05/15  Yes Domenic Moras, PA-C  lithium carbonate (ESKALITH) 450 MG CR tablet Take 450 mg by mouth 2 (two) times daily.   Yes Historical Provider, MD  paliperidone (INVEGA) 6 MG 24 hr tablet Take 6 mg by mouth 2 (two) times daily.   Yes Historical Provider, MD   polyethylene glycol (MIRALAX / GLYCOLAX) packet Take 17 g by mouth daily as needed for mild constipation or moderate constipation. 07/22/15  Yes Clayton Bibles, PA-C  cephALEXin (KEFLEX) 500 MG capsule Take 1 capsule (500 mg total) by mouth 3 (three) times daily. Patient not taking: Reported on 12/12/2015 07/18/15   Olivia Canter Sam, PA-C  cephALEXin (KEFLEX) 500 MG capsule Take 1 capsule (500 mg total) by mouth 3 (three) times daily. 12/21/15   Forde Dandy, MD  ciprofloxacin (CIPRO) 500 MG tablet Take 1 tablet (500 mg total) by mouth 2 (two) times daily. Patient not taking: Reported on 12/12/2015 11/01/15   Drenda Freeze, MD  famotidine (PEPCID) 20 MG tablet Take 1 tablet (20 mg total) by mouth 2 (two) times daily. Patient not taking: Reported on 12/21/2015 12/12/15   Virgel Manifold, MD  metroNIDAZOLE (FLAGYL) 500 MG tablet Take 1 tablet (500 mg total) by mouth 2 (two) times daily. Patient not taking: Reported on 12/12/2015 11/01/15   Drenda Freeze, MD  metroNIDAZOLE (FLAGYL) 500 MG tablet Take 1 tablet (500 mg total) by mouth 2 (two) times daily. 12/21/15   Forde Dandy, MD    Family History History reviewed. No pertinent family history.  Social History Social History  Substance Use Topics  . Smoking status: Current Every Day Smoker  Packs/day: 0.50    Types: Cigarettes  . Smokeless tobacco: Never Used  . Alcohol use Yes     Comment: occ     Allergies   Penicillins   Review of Systems Review of Systems 10/14 systems reviewed and are negative other than those stated in the HPI   Physical Exam Updated Vital Signs BP 114/67 (BP Location: Left Arm)   Pulse (!) 57   Temp 97.5 F (36.4 C) (Oral)   Resp 15   SpO2 100%   Physical Exam Physical Exam  Nursing note and vitals reviewed. Constitutional: Well developed, well nourished, non-toxic, and in no acute distress Head: Normocephalic and atraumatic.  Mouth/Throat: Oropharynx is clear and moist.  Neck: Normal range of motion.  Neck supple.  Cardiovascular: Normal rate and regular rhythm.   Pulmonary/Chest: Effort normal and breath sounds normal.  Abdominal: Soft. There is no tenderness. There is no rebound and no guarding. mild left CVA tenderness. Musculoskeletal: Normal range of motion.  Neurological: Alert, no facial droop, fluent speech, moves all extremities symmetrically Skin: Skin is warm and dry.  Psychiatric: Cooperative   ED Treatments / Results  Labs (all labs ordered are listed, but only abnormal results are displayed) Labs Reviewed  WET PREP, GENITAL - Abnormal; Notable for the following:       Result Value   Clue Cells Wet Prep HPF POC PRESENT (*)    WBC, Wet Prep HPF POC FEW (*)    All other components within normal limits  URINALYSIS, ROUTINE W REFLEX MICROSCOPIC (NOT AT [facility]) - Abnormal; Notable for the following:    APPearance CLOUDY (*)    Leukocytes, UA TRACE (*)    All other components within normal limits  URINE MICROSCOPIC-ADD ON - Abnormal; Notable for the following:    Squamous Epithelial / LPF 0-5 (*)    Bacteria, UA MANY (*)    Casts HYALINE CASTS (*)    All other components within normal limits  URINE CULTURE  GC/CHLAMYDIA PROBE AMP ([place] [facility]) NOT AT [facility]    EKG  EKG Interpretation None       Radiology No results found.  Procedures Procedures (including critical care time)  Medications Ordered in ED Medications - No data to display   Initial Impression / Assessment and Plan / ED Course  I have reviewed the triage vital signs and the nursing notes.  Pertinent labs & imaging results that were available during my care of the patient were reviewed by me and considered in my medical decision making (see chart for details).  Clinical Course    Presenting with one week dysuria and vaginal discharge. Vital signs normal. She is well appearing. She has soft and benign abdomen. Minimal left CVA tenderness. Pelvic exam w/o concern for PID, but positive for BV.  Will treat w/ course of flagyl. UA with small leukocytes and many bacteria. Given dysuria and flank pain with treat for potential upper urinary tract infection w/ 10 day course of keflex. The patient appears reasonably screened and/or stabilized for discharge and I doubt any other medical condition or other [place] requiring further screening, evaluation, or treatment in the ED at this time prior to discharge. Strict return and follow-up instructions reviewed. She expressed understanding of all discharge instructions and felt comfortable with the plan of care.    Final Clinical Impressions(s) / ED Diagnoses   Final diagnoses:  Bacterial vaginosis  UTI (lower urinary tract infection)    New Prescriptions Discharge Medication List as of 12/21/2015  2:42 PM    START taking these medications   Details  !! metroNIDAZOLE (FLAGYL) 500 MG tablet Take 1 tablet (500 mg total) by mouth 2 (two) times daily., Starting Fri 12/21/2015, Print    nitrofurantoin, macrocrystal-monohydrate, (MACROBID) 100 MG capsule Take 1 capsule (100 mg total) by mouth 2 (two) times daily., Starting Fri 12/21/2015, Print     !! - Potential duplicate medications found. Please discuss with provider.       Forde Dandy, MD 12/21/15 2051

## 2015-12-22 LAB — URINE CULTURE: Culture: NO GROWTH

## 2015-12-24 LAB — GC/CHLAMYDIA PROBE AMP (~~LOC~~) NOT AT ARMC
Chlamydia: NEGATIVE
Neisseria Gonorrhea: NEGATIVE

## 2016-04-07 ENCOUNTER — Encounter (HOSPITAL_COMMUNITY): Payer: Self-pay

## 2016-04-07 ENCOUNTER — Emergency Department (HOSPITAL_COMMUNITY)
Admission: EM | Admit: 2016-04-07 | Discharge: 2016-04-07 | Disposition: A | Discharge status: Home / Self Care | Payer: Medicaid Other | Attending: Emergency Medicine | Admitting: Emergency Medicine

## 2016-04-07 DIAGNOSIS — F19939 Other psychoactive substance use, unspecified with withdrawal, unspecified: Secondary | ICD-10-CM | POA: Diagnosis not present

## 2016-04-07 DIAGNOSIS — T405X1A Poisoning by cocaine, accidental (unintentional), initial encounter: Secondary | ICD-10-CM | POA: Diagnosis present

## 2016-04-07 DIAGNOSIS — Z7982 Long term (current) use of aspirin: Secondary | ICD-10-CM | POA: Insufficient documentation

## 2016-04-07 DIAGNOSIS — Z79899 Other long term (current) drug therapy: Secondary | ICD-10-CM | POA: Diagnosis not present

## 2016-04-07 DIAGNOSIS — F17210 Nicotine dependence, cigarettes, uncomplicated: Secondary | ICD-10-CM | POA: Diagnosis not present

## 2016-04-07 DIAGNOSIS — T50901A Poisoning by unspecified drugs, medicaments and biological substances, accidental (unintentional), initial encounter: Secondary | ICD-10-CM

## 2016-04-07 DIAGNOSIS — J45909 Unspecified asthma, uncomplicated: Secondary | ICD-10-CM | POA: Insufficient documentation

## 2016-04-07 DIAGNOSIS — F1910 Other psychoactive substance abuse, uncomplicated: Secondary | ICD-10-CM

## 2016-04-07 LAB — COMPREHENSIVE METABOLIC PANEL
ALT: 20 U/L (ref 14–54)
AST: 21 U/L (ref 15–41)
Albumin: 4 g/dL (ref 3.5–5.0)
Alkaline Phosphatase: 62 U/L (ref 38–126)
Anion gap: 4 — ABNORMAL LOW (ref 5–15)
BUN: 12 mg/dL (ref 6–20)
CO2: 26 mmol/L (ref 22–32)
Calcium: 9.4 mg/dL (ref 8.9–10.3)
Chloride: 106 mmol/L (ref 101–111)
Creatinine, Ser: 0.83 mg/dL (ref 0.44–1.00)
GFR calc Af Amer: >60 mL/min (ref >60)
GFR calc non Af Amer: >60 mL/min (ref >60)
Glucose, Bld: 106 mg/dL — ABNORMAL HIGH (ref 65–99)
Potassium: 3.4 mmol/L — ABNORMAL LOW (ref 3.5–5.1)
Sodium: 136 mmol/L (ref 135–145)
Total Bilirubin: 0.4 mg/dL (ref 0.3–1.2)
Total Protein: 8.1 g/dL (ref 6.5–8.1)

## 2016-04-07 LAB — ETHANOL: Alcohol, Ethyl (B): <5 mg/dL (ref <5)

## 2016-04-07 LAB — CBC WITH DIFFERENTIAL/PLATELET
Basophils Absolute: 0 10*3/uL (ref 0.0–0.1)
Basophils Relative: 0 %
Eosinophils Absolute: 0.5 10*3/uL (ref 0.0–0.7)
Eosinophils Relative: 10 %
HCT: 38.1 % (ref 36.0–46.0)
Hemoglobin: 12.2 g/dL (ref 12.0–15.0)
Lymphocytes Relative: 25 %
Lymphs Abs: 1.3 10*3/uL (ref 0.7–4.0)
MCH: 22.9 pg — ABNORMAL LOW (ref 26.0–34.0)
MCHC: 32 g/dL (ref 30.0–36.0)
MCV: 71.6 fL — ABNORMAL LOW (ref 78.0–100.0)
Monocytes Absolute: 0.3 10*3/uL (ref 0.1–1.0)
Monocytes Relative: 6 %
Neutro Abs: 3 10*3/uL (ref 1.7–7.7)
Neutrophils Relative %: 59 %
Platelets: 273 10*3/uL (ref 150–400)
RBC: 5.32 MIL/uL — ABNORMAL HIGH (ref 3.87–5.11)
RDW: 15.4 % (ref 11.5–15.5)
WBC: 5.1 10*3/uL (ref 4.0–10.5)

## 2016-04-07 LAB — RAPID URINE DRUG SCREEN, HOSP PERFORMED
Amphetamines: NOT DETECTED
Barbiturates: NOT DETECTED
Benzodiazepines: NOT DETECTED
Cocaine: POSITIVE — AB
Opiates: NOT DETECTED
Tetrahydrocannabinol: NOT DETECTED

## 2016-04-07 LAB — LITHIUM LEVEL: Lithium Lvl: 0.73 mmol/L (ref 0.60–1.20)

## 2016-04-07 NOTE — ED Notes (Signed)
Informed patient we need a urine sample and she said she could not urinate at this time

## 2016-04-07 NOTE — ED Notes (Signed)
Bed: [id number] Expected date:  Expected time:  Means of arrival:  Comments: 60yo F/??

## 2016-04-07 NOTE — ED Triage Notes (Signed)
Pt brought in by EMS. EMS as per pt had taken a unknown pill while drinking alcohol  And had the patient sleeping x 2 days, as per pt has just woken up and came to ED to find out which she had taken, an what is wrong with her Pt does report drinking alcohol today . Pt reports hx of mental health and on lithium.

## 2016-04-07 NOTE — Discharge Instructions (Signed)
Your drug screen was only positive for cocaine, there was no other findings.  Uncertain what pill was.  Refrain from taking medications or pills that you do not know what they are. Follow-up with your doctor. Return here for new concerns.

## 2016-04-07 NOTE — ED Notes (Signed)
Patient asked to walk to restroom. Patient could not walk without help from staff. Patient has unsteady gait. RN made aware.

## 2016-04-07 NOTE — ED Notes (Signed)
Meal tray given to pt.

## 2016-04-07 NOTE — ED Notes (Signed)
Pt made aware that urine sample is needed.

## 2016-04-07 NOTE — ED Notes (Signed)
Bed: WHALA Expected date:  Expected time:  Means of arrival:  Comments: 

## 2016-04-07 NOTE — ED Provider Notes (Signed)
[facility] DEPT Provider Note   CSN: [id number] Arrival date & time: 04/07/16  0449     History   Chief Complaint Chief Complaint  Patient presents with  . Ingestion    HPI Karen Best is a 60 y.o. female.  HPI  This is a 60 year old female with a history of bipolar, schizophrenia, and substance abuse who presents requesting screening for possible drugs. Patient reports that she was given an unknown pill daily to go. She states that she "has slept since that time." She wants to know what she was given. She does report crack cocaine use 2 days ago. Denies any alcohol use earlier today (reported otherwise to nursing).  Denies any physical complaints including fever, shortness of breath, chest pain, abdominal pain. Patient lives by herself.  Past Medical History:  Diagnosis Date  . Asthma   . Bipolar 1 disorder ([place])   . Fibromyalgia   . Schizophrenia ([place])   . Tobacco abuse     Patient Active Problem List   Diagnosis Date Noted  . Psychosis 09/16/2013  . Schizophrenia [facility])     Past Surgical History:  Procedure Laterality Date  . CESAREAN SECTION    . TUBAL LIGATION      OB History    No data available       Home Medications    Prior to Admission medications   Medication Sig Start Date End Date Taking? Authorizing Provider  aspirin EC 81 MG tablet Take 81 mg by mouth daily.    Historical Provider, MD  beclomethasone (QVAR) 40 MCG/ACT inhaler Inhale 2 puffs into the lungs 2 (two) times daily.    Historical Provider, MD  benztropine (COGENTIN) 1 MG tablet Take 1 mg by mouth 2 (two) times daily as needed for tremors.     Historical Provider, MD  cephALEXin (KEFLEX) 500 MG capsule Take 1 capsule (500 mg total) by mouth 3 (three) times daily. Patient not taking: Reported on 12/12/2015 07/18/15   Olivia Canter Sam, PA-C  cephALEXin (KEFLEX) 500 MG capsule Take 1 capsule (500 mg total) by mouth 3 (three) times daily. 12/21/15   Forde Dandy, MD  ciprofloxacin  (CIPRO) 500 MG tablet Take 1 tablet (500 mg total) by mouth 2 (two) times daily. Patient not taking: Reported on 12/12/2015 11/01/15   Drenda Freeze, MD  famotidine (PEPCID) 20 MG tablet Take 1 tablet (20 mg total) by mouth 2 (two) times daily. Patient not taking: Reported on 12/21/2015 12/12/15   Virgel Manifold, MD  ibuprofen (ADVIL,MOTRIN) 200 MG tablet Take 2 tablets (400 mg total) by mouth every 6 (six) hours as needed for moderate pain. 01/05/15   Domenic Moras, PA-C  lithium carbonate (ESKALITH) 450 MG CR tablet Take 450 mg by mouth 2 (two) times daily.    Historical Provider, MD  metroNIDAZOLE (FLAGYL) 500 MG tablet Take 1 tablet (500 mg total) by mouth 2 (two) times daily. Patient not taking: Reported on 12/12/2015 11/01/15   Drenda Freeze, MD  metroNIDAZOLE (FLAGYL) 500 MG tablet Take 1 tablet (500 mg total) by mouth 2 (two) times daily. 12/21/15   Forde Dandy, MD  paliperidone (INVEGA) 6 MG 24 hr tablet Take 6 mg by mouth 2 (two) times daily.    Historical Provider, MD  polyethylene glycol (MIRALAX / GLYCOLAX) packet Take 17 g by mouth daily as needed for mild constipation or moderate constipation. 07/22/15   Clayton Bibles, PA-C    Family History History reviewed. No pertinent family history.  Social History Social History  Substance Use Topics  . Smoking status: Current Every Day Smoker    Packs/day: 0.50    Types: Cigarettes  . Smokeless tobacco: Never Used  . Alcohol use Yes     Comment: occ     Allergies   Penicillins   Review of Systems Review of Systems  Constitutional: Negative for fever.  Respiratory: Negative for shortness of breath.   Cardiovascular: Negative for chest pain.  Gastrointestinal: Negative for abdominal pain, nausea and vomiting.  Psychiatric/Behavioral: Negative for confusion.  All other systems reviewed and are negative.    Physical Exam Updated Vital Signs BP 125/75 (BP Location: Left Arm)   Pulse 81   Temp 98.2 F (36.8 C) (Oral)   Resp 18    SpO2 99%   Physical Exam  Constitutional: She is oriented to person, place, and time. No distress.  Disheveled, no acute distress  HENT:  Head: Normocephalic and atraumatic.  Poor dentition  Eyes: Pupils are equal, round, and reactive to light.  Pupils 4 mm reactive bilaterally  Neck: Neck supple.  Cardiovascular: Normal rate, regular rhythm and normal heart sounds.   Pulmonary/Chest: Effort normal. No respiratory distress. She has no wheezes.  Abdominal: Soft. Bowel sounds are normal.  Neurological: She is alert and oriented to person, place, and time.  Skin: Skin is warm and dry.  Psychiatric: She has a normal mood and affect.  Nursing note and vitals reviewed.    ED Treatments / Results  Labs (all labs ordered are listed, but only abnormal results are displayed) Labs Reviewed  CBC WITH DIFFERENTIAL/PLATELET - Abnormal; Notable for the following:       Result Value   RBC 5.32 (*)    MCV 71.6 (*)    MCH 22.9 (*)    All other components within normal limits  COMPREHENSIVE METABOLIC PANEL  ETHANOL  RAPID URINE DRUG SCREEN, HOSP PERFORMED  LITHIUM LEVEL    EKG  EKG Interpretation None       Radiology No results found.  Procedures Procedures (including critical care time)  Medications Ordered in ED Medications - No data to display   Initial Impression / Assessment and Plan / ED Course  I have reviewed the triage vital signs and the nursing notes.  Pertinent labs & imaging results that were available during my care of the patient were reviewed by me and considered in my medical decision making (see chart for details).  Clinical Course     Patient presents requesting evaluation for "being slipped something." She is nontoxic. Vital signs reassuring. She is disheveled. Physical exam is fairly benign. Lithium level within normal range. Alcohol negative. Basic labwork reassuring. UDS pending.  Final Clinical Impressions(s) / ED Diagnoses   Final diagnoses:    None    New Prescriptions New Prescriptions   No medications on file     Merryl Hacker, MD 04/08/16 2256

## 2016-04-07 NOTE — ED Notes (Signed)
Urine collected and sent at [facility] per [facility] EMT.

## 2016-04-07 NOTE — ED Provider Notes (Signed)
Assumed care from Dr. Dina Rich at shift change.  Patient requested drug screen due to sleeping for the past 2 days after being given an unknown pill.  Does use crack cocaine, last use 2 days ago.  Screening labs reassuring.  Plan:  UDS pending.  Follow-up on results, likely discharge.  Results for orders placed or performed during the hospital encounter of 04/07/16  CBC with Differential  Result Value Ref Range   WBC 5.1 4.0 - 10.5 K/uL   RBC 5.32 (H) 3.87 - 5.11 MIL/uL   Hemoglobin 12.2 12.0 - 15.0 g/dL   HCT 38.1 36.0 - 46.0 %   MCV 71.6 (L) 78.0 - 100.0 fL   MCH 22.9 (L) 26.0 - 34.0 pg   MCHC 32.0 30.0 - 36.0 g/dL   RDW 15.4 11.5 - 15.5 %   Platelets 273 150 - 400 K/uL   Neutrophils Relative % 59 %   Neutro Abs 3.0 1.7 - 7.7 K/uL   Lymphocytes Relative 25 %   Lymphs Abs 1.3 0.7 - 4.0 K/uL   Monocytes Relative 6 %   Monocytes Absolute 0.3 0.1 - 1.0 K/uL   Eosinophils Relative 10 %   Eosinophils Absolute 0.5 0.0 - 0.7 K/uL   Basophils Relative 0 %   Basophils Absolute 0.0 0.0 - 0.1 K/uL  Comprehensive metabolic panel  Result Value Ref Range   Sodium 136 135 - 145 mmol/L   Potassium 3.4 (L) 3.5 - 5.1 mmol/L   Chloride 106 101 - 111 mmol/L   CO2 26 22 - 32 mmol/L   Glucose, Bld 106 (H) 65 - 99 mg/dL   BUN 12 6 - 20 mg/dL   Creatinine, Ser 0.83 0.44 - 1.00 mg/dL   Calcium 9.4 8.9 - 10.3 mg/dL   Total Protein 8.1 6.5 - 8.1 g/dL   Albumin 4.0 3.5 - 5.0 g/dL   AST 21 15 - 41 U/L   ALT 20 14 - 54 U/L   Alkaline Phosphatase 62 38 - 126 U/L   Total Bilirubin 0.4 0.3 - 1.2 mg/dL   GFR calc non Af Amer >60 >60 mL/min   GFR calc Af Amer >60 >60 mL/min   Anion gap 4 (L) 5 - 15  Ethanol  Result Value Ref Range   Alcohol, Ethyl (B) <5 <5 mg/dL  Rapid urine drug screen (hospital performed)  Result Value Ref Range   Opiates NONE DETECTED NONE DETECTED   Cocaine POSITIVE (A) NONE DETECTED   Benzodiazepines NONE DETECTED NONE DETECTED   Amphetamines NONE DETECTED NONE DETECTED   Tetrahydrocannabinol NONE DETECTED NONE DETECTED   Barbiturates NONE DETECTED NONE DETECTED  Lithium level  Result Value Ref Range   Lithium Lvl 0.73 0.60 - 1.20 mmol/L   No results found.   UDS is only + for cocaine which she admitted to using 2 days ago.  No other abnormalities noted.  Patient was informed of her results.  D/c home in stable condition.   Larene Pickett, PA-C 04/07/16 [place], MD 04/07/16 1929

## 2016-07-28 ENCOUNTER — Emergency Department (HOSPITAL_COMMUNITY): Payer: Medicaid Other

## 2016-07-28 ENCOUNTER — Encounter (HOSPITAL_COMMUNITY): Payer: Self-pay | Admitting: Emergency Medicine

## 2016-07-28 ENCOUNTER — Emergency Department (HOSPITAL_COMMUNITY)
Admission: EM | Admit: 2016-07-28 | Discharge: 2016-07-28 | Disposition: A | Discharge status: Home / Self Care | Payer: Medicaid Other | Attending: Emergency Medicine | Admitting: Emergency Medicine

## 2016-07-28 DIAGNOSIS — Y9301 Activity, walking, marching and hiking: Secondary | ICD-10-CM | POA: Diagnosis not present

## 2016-07-28 DIAGNOSIS — Z79899 Other long term (current) drug therapy: Secondary | ICD-10-CM | POA: Diagnosis not present

## 2016-07-28 DIAGNOSIS — F17210 Nicotine dependence, cigarettes, uncomplicated: Secondary | ICD-10-CM | POA: Diagnosis not present

## 2016-07-28 DIAGNOSIS — Y92009 Unspecified place in unspecified non-institutional (private) residence as the place of occurrence of the external cause: Secondary | ICD-10-CM | POA: Insufficient documentation

## 2016-07-28 DIAGNOSIS — R51 Headache: Secondary | ICD-10-CM | POA: Insufficient documentation

## 2016-07-28 DIAGNOSIS — J45909 Unspecified asthma, uncomplicated: Secondary | ICD-10-CM | POA: Insufficient documentation

## 2016-07-28 DIAGNOSIS — M549 Dorsalgia, unspecified: Secondary | ICD-10-CM | POA: Diagnosis present

## 2016-07-28 DIAGNOSIS — A599 Trichomoniasis, unspecified: Secondary | ICD-10-CM | POA: Insufficient documentation

## 2016-07-28 LAB — COMPREHENSIVE METABOLIC PANEL
ALT: 23 U/L (ref 14–54)
AST: 24 U/L (ref 15–41)
Albumin: 3.6 g/dL (ref 3.5–5.0)
Alkaline Phosphatase: 69 U/L (ref 38–126)
Anion gap: 4 — ABNORMAL LOW (ref 5–15)
BUN: <5 mg/dL — ABNORMAL LOW (ref 6–20)
CO2: 26 mmol/L (ref 22–32)
Calcium: 9.2 mg/dL (ref 8.9–10.3)
Chloride: 107 mmol/L (ref 101–111)
Creatinine, Ser: 0.76 mg/dL (ref 0.44–1.00)
GFR calc Af Amer: >60 mL/min (ref >60)
GFR calc non Af Amer: >60 mL/min (ref >60)
Glucose, Bld: 99 mg/dL (ref 65–99)
Potassium: 3.8 mmol/L (ref 3.5–5.1)
Sodium: 137 mmol/L (ref 135–145)
Total Bilirubin: 0.4 mg/dL (ref 0.3–1.2)
Total Protein: 7.9 g/dL (ref 6.5–8.1)

## 2016-07-28 LAB — WET PREP, GENITAL
Clue Cells Wet Prep HPF POC: NONE SEEN
Sperm: NONE SEEN
Yeast Wet Prep HPF POC: NONE SEEN

## 2016-07-28 LAB — CBC
HCT: 40.4 % (ref 36.0–46.0)
Hemoglobin: 12.4 g/dL (ref 12.0–15.0)
MCH: 22.1 pg — ABNORMAL LOW (ref 26.0–34.0)
MCHC: 30.7 g/dL (ref 30.0–36.0)
MCV: 71.9 fL — ABNORMAL LOW (ref 78.0–100.0)
Platelets: 253 10*3/uL (ref 150–400)
RBC: 5.62 MIL/uL — ABNORMAL HIGH (ref 3.87–5.11)
RDW: 15.5 % (ref 11.5–15.5)
WBC: 3.4 10*3/uL — ABNORMAL LOW (ref 4.0–10.5)

## 2016-07-28 LAB — URINALYSIS, ROUTINE W REFLEX MICROSCOPIC
Bilirubin Urine: NEGATIVE
Glucose, UA: NEGATIVE mg/dL
Hgb urine dipstick: NEGATIVE
Ketones, ur: NEGATIVE mg/dL
Nitrite: NEGATIVE
Protein, ur: NEGATIVE mg/dL
Specific Gravity, Urine: 1.004 — ABNORMAL LOW (ref 1.005–1.030)
pH: 7 (ref 5.0–8.0)

## 2016-07-28 LAB — LIPASE, BLOOD: Lipase: 18 U/L (ref 11–51)

## 2016-07-28 MED ORDER — DOXYCYCLINE HYCLATE 100 MG PO CAPS: 100.0000 mg | ORAL_CAPSULE | Freq: Two times a day (BID) | ORAL | 0 refills | Status: AC

## 2016-07-28 MED ORDER — METRONIDAZOLE 500 MG PO TABS
2000.0000 mg | ORAL_TABLET | Freq: Once | ORAL | Status: AC
  Administered 2016-07-28: 2000 mg via ORAL
  Filled 2016-07-28: qty 4

## 2016-07-28 MED ORDER — AZITHROMYCIN 250 MG PO TABS
1000.0000 mg | ORAL_TABLET | Freq: Once | ORAL | Status: AC
  Administered 2016-07-28: 1000 mg via ORAL
  Filled 2016-07-28: qty 4

## 2016-07-28 MED ORDER — CEFTRIAXONE SODIUM 250 MG IJ SOLR
250.0000 mg | Freq: Once | INTRAMUSCULAR | Status: AC
  Administered 2016-07-28: 250 mg via INTRAMUSCULAR
  Filled 2016-07-28: qty 250

## 2016-07-28 MED ORDER — STERILE WATER FOR INJECTION IJ SOLN
INTRAMUSCULAR | Status: AC
Start: 2016-07-28 — End: 2016-07-28
  Administered 2016-07-28: 10 mL
  Filled 2016-07-28: qty 10
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
## 2016-07-28 NOTE — ED Notes (Signed)
Patient transported to X-ray 
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
## 2016-07-29 LAB — GC/CHLAMYDIA PROBE AMP (~~LOC~~) NOT AT ARMC
Chlamydia: NEGATIVE
Neisseria Gonorrhea: NEGATIVE

## 2017-08-21 ENCOUNTER — Encounter (HOSPITAL_COMMUNITY): Payer: Self-pay | Admitting: Emergency Medicine

## 2017-08-21 ENCOUNTER — Emergency Department (HOSPITAL_COMMUNITY): Payer: Medicaid Other

## 2017-08-21 ENCOUNTER — Emergency Department (HOSPITAL_COMMUNITY)
Admission: EM | Admit: 2017-08-21 | Discharge: 2017-08-21 | Disposition: A | Discharge status: Home / Self Care | Payer: Medicaid Other | Attending: Emergency Medicine | Admitting: Emergency Medicine

## 2017-08-21 DIAGNOSIS — M791 Myalgia, unspecified site: Secondary | ICD-10-CM | POA: Diagnosis not present

## 2017-08-21 DIAGNOSIS — F17210 Nicotine dependence, cigarettes, uncomplicated: Secondary | ICD-10-CM | POA: Insufficient documentation

## 2017-08-21 DIAGNOSIS — R05 Cough: Secondary | ICD-10-CM | POA: Diagnosis not present

## 2017-08-21 DIAGNOSIS — R079 Chest pain, unspecified: Secondary | ICD-10-CM | POA: Diagnosis present

## 2017-08-21 DIAGNOSIS — J189 Pneumonia, unspecified organism: Secondary | ICD-10-CM

## 2017-08-21 DIAGNOSIS — J45909 Unspecified asthma, uncomplicated: Secondary | ICD-10-CM | POA: Diagnosis not present

## 2017-08-21 DIAGNOSIS — Z79899 Other long term (current) drug therapy: Secondary | ICD-10-CM | POA: Insufficient documentation

## 2017-08-21 LAB — BASIC METABOLIC PANEL
Anion gap: 7 (ref 5–15)
BUN: 5 mg/dL — ABNORMAL LOW (ref 6–20)
CO2: 20 mmol/L — ABNORMAL LOW (ref 22–32)
Calcium: 9.4 mg/dL (ref 8.9–10.3)
Chloride: 104 mmol/L (ref 101–111)
Creatinine, Ser: 0.74 mg/dL (ref 0.44–1.00)
GFR calc Af Amer: >60 mL/min (ref >60)
GFR calc non Af Amer: >60 mL/min (ref >60)
Glucose, Bld: 150 mg/dL — ABNORMAL HIGH (ref 65–99)
Potassium: 3.9 mmol/L (ref 3.5–5.1)
Sodium: 131 mmol/L — ABNORMAL LOW (ref 135–145)

## 2017-08-21 LAB — CBC
HCT: 35.9 % — ABNORMAL LOW (ref 36.0–46.0)
Hemoglobin: 11.5 g/dL — ABNORMAL LOW (ref 12.0–15.0)
MCH: 23.4 pg — ABNORMAL LOW (ref 26.0–34.0)
MCHC: 32 g/dL (ref 30.0–36.0)
MCV: 73 fL — ABNORMAL LOW (ref 78.0–100.0)
Platelets: 253 10*3/uL (ref 150–400)
RBC: 4.92 MIL/uL (ref 3.87–5.11)
RDW: 15.7 % — ABNORMAL HIGH (ref 11.5–15.5)
WBC: 10.6 10*3/uL — ABNORMAL HIGH (ref 4.0–10.5)

## 2017-08-21 LAB — I-STAT BETA HCG BLOOD, ED (MC, WL, AP ONLY): I-stat hCG, quantitative: <5 m[IU]/mL (ref <5)

## 2017-08-21 LAB — I-STAT TROPONIN, ED: Troponin i, poc: 0.01 ng/mL (ref 0.00–0.08)

## 2017-08-21 MED ORDER — DOXYCYCLINE HYCLATE 100 MG PO CAPS: 100.0000 mg | ORAL_CAPSULE | Freq: Two times a day (BID) | ORAL | 0 refills | Status: DC

## 2017-08-21 MED ORDER — BENZONATATE 100 MG PO CAPS: 100.0000 mg | ORAL_CAPSULE | Freq: Three times a day (TID) | ORAL | 0 refills | Status: DC

## 2017-08-21 MED ORDER — LEVOFLOXACIN IN D5W 750 MG/150ML IV SOLN
750.0000 mg | Freq: Once | INTRAVENOUS | Status: AC
  Administered 2017-08-21: 750 mg via INTRAVENOUS
  Filled 2017-08-21: qty 150

## 2017-08-21 NOTE — ED Triage Notes (Signed)
BIB EMS from home reporting chest pain, sharp in nature. Pt smokes crack cocaine daily and states pain started after smoking.

## 2017-08-21 NOTE — ED Notes (Signed)
Patient verbalizes understanding of discharge instructions. Opportunity for questioning and answers were provided. Armband removed by staff, pt discharged from ED ambulatory.   

## 2017-08-21 NOTE — ED Provider Notes (Signed)
[facility] EMERGENCY DEPARTMENT Provider Note   CSN: [id number] Arrival date & time: 08/21/17  0630     History   Chief Complaint Chief Complaint  Patient presents with  . Chest Pain    HPI Karen Best is a 62 y.o. female.  HPI   62 year old female with history of schizophrenia, bipolar, fibromyalgia, polysubstance abuse presenting for evaluation of chest pain.  Patient report for the past 4 days she has had cough productive with yellow phlegm, chills, pleuritic chest pain, and generalized body ache.  Symptoms started shortly after she smoked crack which she does on a daily basis.  She have not smoked any since.  She denies nausea vomiting diarrhea and denies any significant shortness of breath.  She denies any specific treatment tried.  No recent hospitalization.  Past Medical History:  Diagnosis Date  . Asthma   . Bipolar 1 disorder ([place])   . Fibromyalgia   . Schizophrenia ([place])   . Tobacco abuse     Patient Active Problem List   Diagnosis Date Noted  . Psychosis ([place]) 09/16/2013  . Schizophrenia [facility])     Past Surgical History:  Procedure Laterality Date  . CESAREAN SECTION    . TUBAL LIGATION       OB History   None      Home Medications    Prior to Admission medications   Medication Sig Start Date End Date Taking? Authorizing Provider  beclomethasone (QVAR) 40 MCG/ACT inhaler Inhale 2 puffs into the lungs 2 (two) times daily.    [provider]  benztropine (COGENTIN) 1 MG tablet Take 1 mg by mouth 2 (two) times daily as needed for tremors.     [provider]  cephALEXin (KEFLEX) 500 MG capsule Take 1 capsule (500 mg total) by mouth 3 (three) times daily. Patient not taking: Reported on 12/12/2015 07/18/15   Sam, Olivia Canter, PA-C  cephALEXin (KEFLEX) 500 MG capsule Take 1 capsule (500 mg total) by mouth 3 (three) times daily. Patient not taking: Reported on 04/07/2016 12/21/15   Forde Dandy, MD  ciprofloxacin  (CIPRO) 500 MG tablet Take 1 tablet (500 mg total) by mouth 2 (two) times daily. Patient not taking: Reported on 12/12/2015 11/01/15   Drenda Freeze, MD  famotidine (PEPCID) 20 MG tablet Take 1 tablet (20 mg total) by mouth 2 (two) times daily. Patient not taking: Reported on 12/21/2015 12/12/15   Virgel Manifold, MD  ibuprofen (ADVIL,MOTRIN) 200 MG tablet Take 2 tablets (400 mg total) by mouth every 6 (six) hours as needed for moderate pain. Patient not taking: Reported on 04/07/2016 01/05/15   Domenic Moras, PA-C  lithium carbonate (ESKALITH) 450 MG CR tablet Take 450 mg by mouth 2 (two) times daily.    [provider]  metroNIDAZOLE (FLAGYL) 500 MG tablet Take 1 tablet (500 mg total) by mouth 2 (two) times daily. Patient not taking: Reported on 12/12/2015 11/01/15   Drenda Freeze, MD  metroNIDAZOLE (FLAGYL) 500 MG tablet Take 1 tablet (500 mg total) by mouth 2 (two) times daily. Patient not taking: Reported on 04/07/2016 12/21/15   Forde Dandy, MD  paliperidone (INVEGA) 6 MG 24 hr tablet Take 6 mg by mouth 2 (two) times daily.    [provider]  polyethylene glycol (MIRALAX / GLYCOLAX) packet Take 17 g by mouth daily as needed for mild constipation or moderate constipation. Patient not taking: Reported on 04/07/2016 07/22/15   Clayton Bibles, PA-C  Family History No family history on file.  Social History Social History   Tobacco Use  . Smoking status: Current Every Day Smoker    Packs/day: 0.50    Types: Cigarettes  . Smokeless tobacco: Never Used  Substance Use Topics  . Alcohol use: Yes    Comment: occ  . Drug use: Yes    Comment: crack cocaine     Allergies   Penicillins   Review of Systems Review of Systems  All other systems reviewed and are negative.    Physical Exam Updated Vital Signs BP 126/72 (BP Location: Right Arm)   Pulse 68   Temp 99.3 F (37.4 C) (Oral)   Resp 16   SpO2 100%   Physical Exam  Constitutional: She appears  well-developed and well-nourished. No distress.  Chronically ill-appearing female resting comfortably.  HENT:  Head: Atraumatic.  Eyes: Conjunctivae are normal.  Neck: Neck supple.  Cardiovascular: Normal rate, regular rhythm, intact distal pulses and normal pulses.  Pulmonary/Chest: Effort normal and breath sounds normal. She has no decreased breath sounds. She has no wheezes. She has no rhonchi. She has no rales.  Decreased breath sounds with poor effort, but no wheezes, rales, rhonchi heard  Abdominal: Soft. There is no tenderness.  Musculoskeletal:       Right lower leg: She exhibits no edema.       Left lower leg: She exhibits no edema.  Neurological: She is alert.  Skin: No rash noted.  Psychiatric: She has a normal mood and affect.  Nursing note and vitals reviewed.    ED Treatments / Results  Labs (all labs ordered are listed, but only abnormal results are displayed) Labs Reviewed  BASIC METABOLIC PANEL - Abnormal; Notable for the following components:      Result Value   Sodium 131 (*)    CO2 20 (*)    Glucose, Bld 150 (*)    BUN 5 (*)    All other components within normal limits  CBC - Abnormal; Notable for the following components:   WBC 10.6 (*)    Hemoglobin 11.5 (*)    HCT 35.9 (*)    MCV 73.0 (*)    MCH 23.4 (*)    RDW 15.7 (*)    All other components within normal limits  I-STAT TROPONIN, ED  I-STAT BETA HCG BLOOD, ED (MC, [facility], AP ONLY)    EKG EKG Interpretation  Date/Time:  Friday August 21 2017 06:39:18 EDT Ventricular Rate:  78 PR Interval:  120 QRS Duration: 94 QT Interval:  374 QTC Calculation: 426 R Axis:   3 Text Interpretation:  Normal sinus rhythm Septal infarct , age undetermined Abnormal ECG No significant change since last tracing Confirmed by Isla Pence [phone]) on 08/21/2017 2:50:58 PM   Radiology Dg Chest 2 View  Result Date: 08/21/2017 CLINICAL DATA:  Chest pain with shortness of breath and weakness. EXAM: CHEST - 2 VIEW  COMPARISON:  07/28/2016 FINDINGS: Focal airspace disease in the lingula suggests pneumonia. Lungs are hyperexpanded. Interstitial markings are diffusely coarsened with chronic features. The cardiopericardial silhouette is within normal limits for size. The visualized bony structures of the thorax are intact. IMPRESSION: Focal airspace opacity in the lingula suggests pneumonia. Follow-up imaging suggested to ensure resolution. Electronically Signed   By: Misty Stanley M.D.   On: 08/21/2017 07:30    Procedures Procedures (including critical care time)  Medications Ordered in ED Medications  levofloxacin (LEVAQUIN) IVPB 750 mg (has no administration in time range)  Initial Impression / Assessment and Plan / ED Course  I have reviewed the triage vital signs and the nursing notes.  Pertinent labs & imaging results that were available during my care of the patient were reviewed by me and considered in my medical decision making (see chart for details).     BP 126/72 (BP Location: Right Arm)   Pulse 68   Temp 99.3 F (37.4 C) (Oral)   Resp 16   SpO2 100%    Final Clinical Impressions(s) / ED Diagnoses   Final diagnoses:  Community acquired pneumonia, unspecified laterality    ED Discharge Orders        Ordered    benzonatate (TESSALON) 100 MG capsule  Every 8 hours     08/21/17 1505    doxycycline (VIBRAMYCIN) 100 MG capsule  2 times daily     08/21/17 1505     3:05 PM Patient here with productive cough, pleuritic chest pain, and chills.  Chest x-ray shows focal airspace opacity in the lingula which suggest pneumonia.  Symptoms consistent with pneumonia.  EKG without concerning changes.  Normal troponin.  Patient initially given Levaquin IV antibiotic however she is currently on Invega therefore the risk of prolonged QT  Increases.  I will switch her antibiotic to doxycycline, and will prescribe Tessalon Perles for cough. she will need to have a repeat x-ray in a week to ensure  resolution.  Patient is aware. Pt signed out to [facility], PA-C who will dispo pending IV abx.    Domenic Moras, PA-C 08/21/17 1522    Fredia Sorrow, MD 08/23/17 2029

## 2017-08-21 NOTE — ED Provider Notes (Signed)
Care assumed from previous provider PA Rona Ravens. Please see note for further details. Case discussed, plan agreed upon.   Patient finished with IV ABX. Aware that she will need to follow up with PCP in 1 week for repeat CXR.  Return precautions discussed. All questions answered    Ward, Ozella Almond, PA-C 08/21/17 1622    [name], MD 08/22/17 [phone]

## 2017-08-29 ENCOUNTER — Emergency Department (HOSPITAL_COMMUNITY)
Admission: EM | Admit: 2017-08-29 | Discharge: 2017-08-29 | Disposition: A | Discharge status: Home / Self Care | Payer: Medicaid Other | Attending: Emergency Medicine | Admitting: Emergency Medicine

## 2017-08-29 ENCOUNTER — Other Ambulatory Visit: Payer: Self-pay

## 2017-08-29 ENCOUNTER — Encounter (HOSPITAL_COMMUNITY): Payer: Self-pay | Admitting: Emergency Medicine

## 2017-08-29 DIAGNOSIS — J45909 Unspecified asthma, uncomplicated: Secondary | ICD-10-CM | POA: Diagnosis not present

## 2017-08-29 DIAGNOSIS — F17210 Nicotine dependence, cigarettes, uncomplicated: Secondary | ICD-10-CM | POA: Insufficient documentation

## 2017-08-29 DIAGNOSIS — Z5900 Homelessness unspecified: Secondary | ICD-10-CM

## 2017-08-29 DIAGNOSIS — R32 Unspecified urinary incontinence: Secondary | ICD-10-CM | POA: Diagnosis not present

## 2017-08-29 DIAGNOSIS — Z79899 Other long term (current) drug therapy: Secondary | ICD-10-CM | POA: Diagnosis not present

## 2017-08-29 NOTE — ED Notes (Signed)
Pt given breakfast tray

## 2017-08-29 NOTE — ED Provider Notes (Signed)
[facility] DEPT Provider Note   CSN: [id number] Arrival date & time: 08/29/17  0223     History   Chief Complaint Chief Complaint  Patient presents with  . Homeless    HPI Karen Best is a 62 y.o. female.  Complains "I didn't get my check".  HPI: 62 year old female.  Homeless.  States she has been at a hotel but did not "get my check".  Does not have a place to stay.  Apparently is followed by [facility] ACT T caseworker.  He has no physical complaints.  States she may been bitten by a bug in the back of her head.  States sometimes she "does not make it to the bathroom before she pees".  No frank incontinence  Past Medical History:  Diagnosis Date  . Asthma   . Bipolar 1 disorder ([place])   . Fibromyalgia   . Schizophrenia ([place])   . Tobacco abuse     Patient Active Problem List   Diagnosis Date Noted  . Psychosis ([place]) 09/16/2013  . Schizophrenia [facility])     Past Surgical History:  Procedure Laterality Date  . CESAREAN SECTION    . TUBAL LIGATION       OB History   None      Home Medications    Prior to Admission medications   Medication Sig Start Date End Date Taking? Authorizing Provider  beclomethasone (QVAR) 40 MCG/ACT inhaler Inhale 2 puffs into the lungs 2 (two) times daily.    [provider]  benzonatate (TESSALON) 100 MG capsule Take 1 capsule (100 mg total) by mouth every 8 (eight) hours. 08/21/17   Domenic Moras, PA-C  benztropine (COGENTIN) 1 MG tablet Take 1 mg by mouth 2 (two) times daily as needed for tremors.     [provider]  doxycycline (VIBRAMYCIN) 100 MG capsule Take 1 capsule (100 mg total) by mouth 2 (two) times daily. One po bid x 7 days 08/21/17   Domenic Moras, PA-C  lithium carbonate (ESKALITH) 450 MG CR tablet Take 450 mg by mouth 2 (two) times daily.    [provider]  paliperidone (INVEGA) 6 MG 24 hr tablet Take 6 mg by mouth 2 (two) times daily.    [provider]     Family History History reviewed. No pertinent family history.  Social History Social History   Tobacco Use  . Smoking status: Current Every Day Smoker    Packs/day: 0.50    Types: Cigarettes  . Smokeless tobacco: Never Used  Substance Use Topics  . Alcohol use: Yes    Comment: occ  . Drug use: Yes    Comment: crack cocaine     Allergies   Penicillins   Review of Systems Review of Systems  Constitutional: Negative for appetite change, chills, diaphoresis, fatigue and fever.  HENT: Negative for mouth sores, sore throat and trouble swallowing.   Eyes: Negative for visual disturbance.  Respiratory: Negative for cough, chest tightness, shortness of breath and wheezing.   Cardiovascular: Negative for chest pain.  Gastrointestinal: Negative for abdominal distention, abdominal pain, diarrhea, nausea and vomiting.  Endocrine: Negative for polydipsia, polyphagia and polyuria.  Genitourinary: Positive for urgency. Negative for dysuria, frequency and hematuria.  Musculoskeletal: Negative for gait problem.  Skin: Negative for color change, pallor and rash.  Neurological: Negative for dizziness, syncope, light-headedness and headaches.  Hematological: Does not bruise/bleed easily.  Psychiatric/Behavioral: Negative for behavioral problems and confusion.     Physical Exam Updated Vital  Signs BP 113/69 (BP Location: Left Arm)   Pulse 68   Temp 98 F (36.7 C) (Oral)   Resp 16   SpO2 100%   Physical Exam  Constitutional: She appears well-developed and well-nourished. No distress.  HENT:  Head: Normocephalic.  Eyes: Pupils are equal, round, and reactive to light. Conjunctivae are normal. No scleral icterus.  Neck: Normal range of motion. Neck supple. No thyromegaly present.  Cardiovascular: Normal rate and regular rhythm. Exam reveals no gallop and no friction rub.  No murmur heard. Pulmonary/Chest: Effort normal and breath sounds normal. No respiratory distress. She has  no wheezes. She has no rales.  Abdominal: Soft. Bowel sounds are normal. She exhibits no distension. There is no tenderness. There is no rebound.  Musculoskeletal: Normal range of motion.  Neurological: She is alert.  Skin: Skin is warm and dry. No rash noted.  Psychiatric: She has a normal mood and affect. Her behavior is normal.     ED Treatments / Results  Labs (all labs ordered are listed, but only abnormal results are displayed) Labs Reviewed - No data to display  EKG None  Radiology No results found.  Procedures Procedures (including critical care time)  Medications Ordered in ED Medications - No data to display   Initial Impression / Assessment and Plan / ED Course  I have reviewed the triage vital signs and the nursing notes.  Pertinent labs & imaging results that were available during my care of the patient were reviewed by me and considered in my medical decision making (see chart for details).    Screening exam performed.  Will ask social work to evaluate to see if there is anything we can offer her.  Final Clinical Impressions(s) / ED Diagnoses   Final diagnoses:  Homeless    ED Discharge Orders    None       Tanna Furry, MD 08/29/17 [phone]

## 2017-08-29 NOTE — ED Provider Notes (Signed)
Blood pressure (!) 97/59, pulse 71, temperature 98 F (36.7 C), temperature source Oral, resp. rate 16, SpO2 97 %.  Assuming care from Dr. Jeneen Rinks.  In short, Karen Best is a 62 y.o. female with a chief complaint of Homeless .  Refer to the original H&P for additional details.  The current plan of care is to f/u with SW recommendations.  09:15 AM Social work evaluated the patient.  Patient was provided resources and is safe for discharge at this time.   Nanda Quinton, MD    Margette Fast, MD 08/29/17 [phone]

## 2017-08-29 NOTE — ED Notes (Signed)
Social Work spoke with pt and gave resources. Pt then walked out of facility. No discharge explained by nursing or signature

## 2017-08-29 NOTE — ED Triage Notes (Signed)
Patient didn't get her check so she cant get a hotel. She has been peeing on herself for the last few days. Patient wants to take a bath. Patient is complaining of a bug bite on the back of her head.

## 2017-08-29 NOTE — Discharge Instructions (Signed)
Substance Abuse Treatment Programs ° °Intensive Outpatient Programs °[facility]     °[address]      °[place], [place]                   °[phone]      ° °[facility] °[address] °[place], [place] °[phone] ° °[facility]     °(Inpatient and outpatient)     °[address]           °[phone]   ° °[facility] °[phone] (Suboxone and Methadone) ° °[address]      °[place], [place] [address]      °[phone]      ° °[address] °[place], [place] °[phone] ° °[facility] (Outpatient/Inpatient, [facility])    °(insurance only) [phone]      °       °[facility] (Groups & Residential) °[place], [place] °[phone] ° °   °[facility]     °[address]     °[place], [place]      °[phone]      ° °[facility] (for caregivers and family) °[address] °[place], [place] °336-299-4655 ° ° ° ° ° °Residential Treatment Programs °[facility]      °[address], [place], [place] [address]  °[phone]      ° °[facility] °[address] [place], [place] [address] °[phone] ° °[facility]        °[phone]      ° °[facility] °[phone] ° °[facility]             °[address]                                         °[place], [place]                                                °[phone] or [phone]                              ° °[facility] [place] °[address] °[place], [address] °[phone] ° °[facility]    °[address]      °[place], [place]     °[phone]      ° °[facility] °[address] °, [place] °[phone] ° °[facility]   °[address]     °[place], [place] [address]     °[phone]      °Admissions: 8am-3pm M-F ° °[facility] °[address] °[place],  [place] °336-227-7417 ° °[facility]: Residential Program (90 Days)   °[place], [place]      °[phone] or [phone]    ° °[facility] °[place], [place] °(Walk in Hours over the weekend or by referral) ° °[facility] °[address], [place], [place] [address] °([phone] ° °Crisis Mobile: [facility]:  1-877-626-1772 (for crisis response 24 hours a day) °[facility] Hotline:      1-800-256-2452 °Outpatient Psychiatry and Counseling ° °[facility]: Mobile Crisis   Management 24 hours:  1-877-626-1772 ° °[facility] sliding scale fee and walk in schedule: M-F 8am-12pm/1pm-3pm °[address]  °[place], [place] [address] °[phone] ° °[facility] °[address] °[place], [place] [address] °[phone] ° °[facility] (Formerly known [facility])- new patient walk-in appointments available Monday - Friday 8am -3pm.          °[address] °[place], [place] [address] °336-676-6840 or crisis line- 336-676-6905 ° °[facility]/ Intensive Outpatient Therapy Program °[address] °[place], [place] [address] °336-832-9804 ° °[facility]                  °Crisis Services      °[phone]      °[address]     °[place], [place] [address]                ° °[facility]   °[facility] °[phone] °[address] °[place], [place] [address] ° ° °[facility]          °[address]  °[place], [place] [address]       °[phone] ° °[facility] °[address] °[place], [place] [address] °336-292-1510 ° °[facility]    °[address]    °[place], [place] [address]     °[phone]      ° °Parish McKinney, MD     °[address]     °[place] [address]     °336-282-1251     °  °[facility] °[address] °[place] [address] ° °[facility]     °[address]     °[place], [place]      °336-542-2076      ° °[facility] °Shamsher Ahluwalia, MD °[address] °[place], [place] [address] °[phone] ° °[facility]     °[address]     °[place], [place] [address]     °336-274-1237      ° °[facility] °[address] °[place], [place] [address] °336-854-4450 °Resources for Temporary Residential Assistance/Crisis Centers ° °DAY CENTERS °[facility] °M-F 8am-3pm   °[address] [place], [place] [address]   336-332-0824 °Services include: laundry, barbering, support groups, case management, phone  & computer access, showers, AA/NA mtgs, mental health/substance abuse nurse, job skills class, disability information, VA assistance, spiritual classes, etc.  ° °HOMELESS SHELTERS ° °[facility]     °[facility]   °[address], [place]     °[phone]       °       °[facility] (women and children)       °[address] °[place], [place] [address] °336-275-0820 °[email or web address] for application and process °Application Required ° °[facility]   °[address]    °[place] [address]     °[phone]       °             °[facility] °[address] °[place], [place] [address] °336.273.5572 °336-235-0363(schedule application appt.) °Application Required ° °[facility] (women only)    °[address]     °[place], [place] [address]     °336-884-1039      °  Intake starts 6pm daily °Need valid ID, SSC, & Police report °[facility] [place] °[address] °[place], [place] °336-881-5420 °Application Required ° °[facility] (men only)     °[address]      °[place], [place]     °[phone]      ° °[facility] °(Pregnant women only) °[address] °[place], [place] °336-275-0206 ° °[facility]      °[address]      °[place], [place] [address]     °[phone]      °       °[facility] °[address] °[place], [place] °336-723-1848 °90 day commitment/SA/Application process ° °[facility](men only)     °[address]     °[place], [place]     °[phone]       °Check-in at 7pm     °       °[facility] °[address] °[place], [place] [address] °336-248-6684 °Men/Women/Women and Children must be there by 7 pm ° °[facility] °[place], [place] °336-722-8721                ° °
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
## 2017-08-29 NOTE — ED Notes (Signed)
Pt given soap, deodorant, toothbrush, toothpaste

## 2017-09-04 ENCOUNTER — Encounter (HOSPITAL_COMMUNITY): Payer: Self-pay | Admitting: Emergency Medicine

## 2017-09-04 ENCOUNTER — Emergency Department (HOSPITAL_COMMUNITY)
Admission: EM | Admit: 2017-09-04 | Discharge: 2017-09-05 | Disposition: A | Discharge status: Home / Self Care | Payer: Medicaid Other | Attending: Emergency Medicine | Admitting: Emergency Medicine

## 2017-09-04 DIAGNOSIS — J45909 Unspecified asthma, uncomplicated: Secondary | ICD-10-CM | POA: Insufficient documentation

## 2017-09-04 DIAGNOSIS — F209 Schizophrenia, unspecified: Secondary | ICD-10-CM | POA: Insufficient documentation

## 2017-09-04 DIAGNOSIS — L0291 Cutaneous abscess, unspecified: Secondary | ICD-10-CM

## 2017-09-04 DIAGNOSIS — F17210 Nicotine dependence, cigarettes, uncomplicated: Secondary | ICD-10-CM | POA: Insufficient documentation

## 2017-09-04 DIAGNOSIS — Z5900 Homelessness unspecified: Secondary | ICD-10-CM

## 2017-09-04 DIAGNOSIS — F1420 Cocaine dependence, uncomplicated: Secondary | ICD-10-CM | POA: Insufficient documentation

## 2017-09-04 DIAGNOSIS — Z79899 Other long term (current) drug therapy: Secondary | ICD-10-CM | POA: Insufficient documentation

## 2017-09-04 DIAGNOSIS — F319 Bipolar disorder, unspecified: Secondary | ICD-10-CM | POA: Insufficient documentation

## 2017-09-04 DIAGNOSIS — R221 Localized swelling, mass and lump, neck: Secondary | ICD-10-CM | POA: Diagnosis present

## 2017-09-04 MED ORDER — DOXYCYCLINE HYCLATE 100 MG PO TABS
100.0000 mg | ORAL_TABLET | Freq: Once | ORAL | Status: AC
  Administered 2017-09-04: 100 mg via ORAL
  Filled 2017-09-04: qty 1

## 2017-09-04 MED ORDER — DOXYCYCLINE HYCLATE 100 MG PO TABS
100.0000 mg | ORAL_TABLET | Freq: Two times a day (BID) | ORAL | Status: DC
  Administered 2017-09-04 – 2017-09-05 (×2): 100 mg via ORAL
  Filled 2017-09-04 (×2): qty 1

## 2017-09-04 MED ORDER — BENZTROPINE MESYLATE 1 MG PO TABS: 1.0000 mg | ORAL_TABLET | Freq: Two times a day (BID) | ORAL | Status: DC | PRN

## 2017-09-04 MED ORDER — DOXYCYCLINE HYCLATE 100 MG PO CAPS: 100.0000 mg | ORAL_CAPSULE | Freq: Two times a day (BID) | ORAL | 0 refills | Status: DC

## 2017-09-04 MED ORDER — PALIPERIDONE ER 6 MG PO TB24
6.0000 mg | ORAL_TABLET | Freq: Two times a day (BID) | ORAL | Status: DC
  Administered 2017-09-04 – 2017-09-05 (×2): 6 mg via ORAL
  Filled 2017-09-04 (×2): qty 1

## 2017-09-04 MED ORDER — LITHIUM CARBONATE ER 450 MG PO TBCR
450.0000 mg | EXTENDED_RELEASE_TABLET | Freq: Two times a day (BID) | ORAL | Status: DC
  Administered 2017-09-04 – 2017-09-05 (×2): 450 mg via ORAL
  Filled 2017-09-04 (×2): qty 1

## 2017-09-04 MED ORDER — LIDOCAINE HCL (PF) 1 % IJ SOLN
10.0000 mL | Freq: Once | INTRAMUSCULAR | Status: AC
  Administered 2017-09-04: 10 mL
  Filled 2017-09-04: qty 10

## 2017-09-04 NOTE — Progress Notes (Addendum)
CSW continuing to follow for social work needs. CSW will await decision from capacity evaluation.   Wendelyn Breslow, [facility]  [phone]

## 2017-09-04 NOTE — ED Provider Notes (Signed)
Patient handed off to me by previous EDP at shift change pending TTS evaluation.  See previous EDP note for full H&P.  Briefly, patient is a 62-year-old female with history of schizophrenia, polysubstance abuse including crack cocaine who is in the ER for evaluation of abscess to posterior neck.  There is concerned patient may not be at capacity to be safely discharged, previous EDP  consulted social worker recommended a TTS evaluation for capacity. Physical Exam  BP 130/74 (BP Location: Right Arm)   Pulse 66   Temp 97.6 F (36.4 C) (Oral)   Resp 16   SpO2 100%   Physical Exam  Constitutional: She appears well-developed and well-nourished.  Able to tell me her name and where she is. Cannot tell me day, month, year or why she is here.   HENT:  Head: Atraumatic.  Eyes: EOM are normal.  Neck: Normal range of motion.  Full PROM of neck without rigidity or pain   Cardiovascular: Normal rate and regular rhythm.  Pulmonary/Chest: Effort normal and breath sounds normal.  Skin:  1 x 1 cm partially unroofed abscess at bottom/left hair line and posterior neck with thick crusting, tender. No surrounding erythema, edema, warmth, induration.    Psychiatric:  Smacking lips, twitching to face and erratic head movements. Poor distal memory. Appears to be responding to internal stimuli. Denies HI, SI, AVH.     ED Course/Procedures     .Marland KitchenIncision and Drainage Date/Time: 09/04/2017 8:11 PM Performed by: Kinnie Feil, PA-C Authorized by: Kinnie Feil, PA-C   Consent:    Consent obtained:  Verbal   Consent given by:  Patient   Risks discussed:  Bleeding, pain, incomplete drainage and infection   Alternatives discussed:  No treatment Location:    Type:  Abscess   Size:  1 x 1   Location:  Neck   Neck location:  L posterior Pre-procedure details:    Skin preparation:  Betadine Anesthesia (see MAR for exact dosages):    Anesthesia method:  None Procedure type:    Complexity:   Simple Procedure details:    Needle aspiration: no     Incision type: unroofing and debridement of thick crusting    Incision depth:  Dermal   Wound management:  Probed and deloculated (cleansed with alcohol wipe and iodine)   Drainage:  Bloody   Drainage amount:  Scant   Wound treatment:  Wound left open (bacitracin, gauze, paper tape to secure)   Packing materials:  None Post-procedure details:    Patient tolerance of procedure:  Tolerated well, no immediate complications    MDM   6333:LKTGY to Marijean Bravo (TTS) who will speak to NP for disposition recommendations.   2045: Spoke to Marijean Bravo again, NP recommending overnight hold for psych eval tomorrow for evaluation of capacity. Disposition per psych.  Patient to be discharged with doxycycline 100 mg capsule BID for 7 days for neck abscess. Rx at bedside.      Kinnie Feil, PA-C 09/04/17 2048    Fatima Blank, MD 09/05/17 [phone]

## 2017-09-04 NOTE — Discharge Instructions (Addendum)
Please see the information and instructions below regarding your visit.  Your diagnoses today include:  1. Abscess   2. Homelessness     Abscesses form when an infection in your skin starts to collect bacteria and white blood cells, walling it off from the rest of your body to protect you from a bigger infection. Risk factors for this type of infection include:  ?Break in the skin ?Diabetes ?Swollen areas  Sometimes the infection starts to spread to surrounding tissue, causing redness and swelling. We call this cellulitis.   Tests performed today include: See side panel of your discharge paperwork for testing performed today. Vital signs are listed at the bottom of these instructions.   Medications prescribed:    Take any prescribed medications only as prescribed, and any over the counter medications only as directed on the packaging.  1. Doxycycline is an antibiotic that fights infection in the skin. This medication can make your skin sensitive to the sun, so please ensure that you wear sunscreen, hats, or other coverage over your skin while taking this. This medicine CANNOT be taken by women while pregnant, breastfeeding, or trying to become pregnant.  Please speak with a healthcare provider if any of these situations apply to you.   Home care instructions:  Please follow any educational materials contained in this packet.   Some things that may promote healing of your wound and infection include:  Raise your arm or leg to reduce swelling - Raise the arm or leg up above the level of your heart 3 or 4 times a day, for 30 minutes each time. Keep the infected area clean and dry. You can take a shower or bath, but be sure to pat the area dry with a towel afterward. Do not put any antibiotic ointments or creams on the area. Reapply a dry gauze dressing any time the bandage has become soaked with drainage, or after cleansing the wound.  Apply warm compresses to the wound 3-4 times daily  to encourage drainage.  Our case manager got you an appointment with [facility] on 09-11-2017 at 71 AM.  This is to follow-up on your skin infection.  Return instructions:  Please return to the Emergency Department if you experience worsening symptoms. You should return for reevaluation of your infection if you notice spreading redness, increased swelling, an abscess develops, or you develop signs and symptoms of a systemic illness such as fever and chills.  Please return if you have any other emergent concerns.  Additional Information:   Your vital signs today were: BP 130/74 (BP Location: Right Arm)    Pulse 66    Temp 97.6 F (36.4 C) (Oral)    Resp 16    SpO2 100%  If your blood pressure (BP) was elevated on multiple readings during this visit above 130 for the top number or above 80 for the bottom number, please have this repeated by your primary care provider within one month. --------------  Thank you for allowing [place] to participate in your care today.
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
## 2017-09-04 NOTE — Discharge Planning (Signed)
Karen Best J. Karen Laming, RN, BSN, Karen Best  [facility] set up appointment with [facility]  on 6/17 @11 :00.  Spoke with pt at bedside and advised to please arrive 15 min early.  Pt verbalizes understanding of keeping appointment.

## 2017-09-04 NOTE — ED Notes (Signed)
Pt eating dinner tray °

## 2017-09-04 NOTE — BHH Counselor (Addendum)
[place] TTS attempted to call to discuss pt and complete assessment. Clinician was put on hold several times. Clinician informed nurse that answered phone that clinician would try back later.
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
## 2017-09-04 NOTE — ED Triage Notes (Addendum)
Per [facility]: [facility] where she was being seen for abscess on L side of neck - weeping noted, but patient states it has been there for 3 weeks. Patient denies fevers/chills. Moves neck without difficulty. Hx multisubstance abuse, but states last time was 24 hours ago, patient A&O x 4. EMS VS: 148/78, P 80 regular, RR 20, 98% RA, CBG 116. Patient has had known bed bugs.

## 2017-09-05 ENCOUNTER — Other Ambulatory Visit: Payer: Self-pay

## 2017-09-05 MED ORDER — DOXYCYCLINE HYCLATE 100 MG PO CAPS: 100.0000 mg | ORAL_CAPSULE | Freq: Two times a day (BID) | ORAL | 0 refills | Status: DC

## 2017-09-05 MED ORDER — ALUM & MAG HYDROXIDE-SIMETH 200-200-20 MG/5ML PO SUSP
30.0000 mL | Freq: Four times a day (QID) | ORAL | Status: DC | PRN
  Administered 2017-09-05: 30 mL via ORAL
  Filled 2017-09-05: qty 30

## 2017-09-05 NOTE — ED Notes (Signed)
SW aware of homeless information needed.

## 2017-09-05 NOTE — ED Notes (Signed)
RN reviewed discharge instructions with patient and patient verbalized understanding. Social work provided resources and 2 bus passes to patient. Belongings returned to patient from locker and security. Patient signed paperwork stating she received belongings. Patient ambulatory and in NAD at discharge.

## 2017-09-05 NOTE — Progress Notes (Signed)
CSw received call from [facility] that pt has been cleared by psych and is needing shelter resources. CSW provided pt with resources for shelters in [place] as well as resources for affordable housing options in the [place] area at this time. There are no further CSW needs at this time. CSW will sign off. If new need arises please recosnult.    Virgie Dad Dioselina Brumbaugh, MSW, [facility] Clinical Social Worker [phone]

## 2017-09-05 NOTE — Progress Notes (Signed)
Patient is seen by me via tele-psych have consulted with Dr. Dwyane Dee.  Patient is clearly in capacity and makes logical decisions and answers my questions appropriately.  Patient understands her diseases and illnesses and understands her medications.  Patient also understands the complications that can come from using cocaine.  Patient stated that she does not have any SI/HI/AVH and contracts for safety and states "I just wanted a place to live."  Patient is cleared psychiatrically and does not meet requirements for inpatient treatment.  ED CSW to provide discharge disposition.

## 2017-09-05 NOTE — ED Provider Notes (Signed)
Pt assessed by psychiatry.  Does not need inpatient treatment.  Stable for dc outpatient treatment   Dorie Rank, MD 09/05/17 1207

## 2017-09-29 ENCOUNTER — Emergency Department (HOSPITAL_COMMUNITY)
Admission: EM | Admit: 2017-09-29 | Discharge: 2017-09-29 | Disposition: A | Discharge status: Other Acute Inpatient Hospital | Payer: Medicaid Other | Attending: Emergency Medicine | Admitting: Emergency Medicine

## 2017-09-29 ENCOUNTER — Encounter (HOSPITAL_COMMUNITY): Payer: Self-pay

## 2017-09-29 ENCOUNTER — Other Ambulatory Visit: Payer: Self-pay

## 2017-09-29 DIAGNOSIS — J45909 Unspecified asthma, uncomplicated: Secondary | ICD-10-CM | POA: Insufficient documentation

## 2017-09-29 DIAGNOSIS — F319 Bipolar disorder, unspecified: Secondary | ICD-10-CM | POA: Insufficient documentation

## 2017-09-29 DIAGNOSIS — Z113 Encounter for screening for infections with a predominantly sexual mode of transmission: Secondary | ICD-10-CM | POA: Insufficient documentation

## 2017-09-29 DIAGNOSIS — F17210 Nicotine dependence, cigarettes, uncomplicated: Secondary | ICD-10-CM | POA: Diagnosis not present

## 2017-09-29 DIAGNOSIS — F209 Schizophrenia, unspecified: Secondary | ICD-10-CM | POA: Insufficient documentation

## 2017-09-29 DIAGNOSIS — F1410 Cocaine abuse, uncomplicated: Secondary | ICD-10-CM | POA: Insufficient documentation

## 2017-09-29 DIAGNOSIS — Z79899 Other long term (current) drug therapy: Secondary | ICD-10-CM | POA: Diagnosis not present

## 2017-09-29 LAB — CBC
HCT: 41.4 % (ref 36.0–46.0)
Hemoglobin: 12.7 g/dL (ref 12.0–15.0)
MCH: 22.6 pg — ABNORMAL LOW (ref 26.0–34.0)
MCHC: 30.7 g/dL (ref 30.0–36.0)
MCV: 73.8 fL — ABNORMAL LOW (ref 78.0–100.0)
Platelets: 342 10*3/uL (ref 150–400)
RBC: 5.61 MIL/uL — ABNORMAL HIGH (ref 3.87–5.11)
RDW: 16 % — ABNORMAL HIGH (ref 11.5–15.5)
WBC: 5.3 10*3/uL (ref 4.0–10.5)

## 2017-09-29 LAB — RAPID URINE DRUG SCREEN, HOSP PERFORMED
Amphetamines: NOT DETECTED
Barbiturates: NOT DETECTED
Benzodiazepines: NOT DETECTED
Cocaine: POSITIVE — AB
Opiates: NOT DETECTED
Tetrahydrocannabinol: NOT DETECTED

## 2017-09-29 LAB — COMPREHENSIVE METABOLIC PANEL
ALT: 26 U/L (ref 14–54)
AST: 29 U/L (ref 15–41)
Albumin: 4 g/dL (ref 3.5–5.0)
Alkaline Phosphatase: 80 U/L (ref 38–126)
Anion gap: 6 (ref 5–15)
BUN: 10 mg/dL (ref 6–20)
CO2: 31 mmol/L (ref 22–32)
Calcium: 9.9 mg/dL (ref 8.9–10.3)
Chloride: 101 mmol/L (ref 101–111)
Creatinine, Ser: 0.69 mg/dL (ref 0.44–1.00)
GFR calc Af Amer: >60 mL/min (ref >60)
GFR calc non Af Amer: >60 mL/min (ref >60)
Glucose, Bld: 72 mg/dL (ref 65–99)
Potassium: 4.3 mmol/L (ref 3.5–5.1)
Sodium: 138 mmol/L (ref 135–145)
Total Bilirubin: 0.3 mg/dL (ref 0.3–1.2)
Total Protein: 8.4 g/dL — ABNORMAL HIGH (ref 6.5–8.1)

## 2017-09-29 LAB — I-STAT BETA HCG BLOOD, ED (MC, WL, AP ONLY): I-stat hCG, quantitative: <5 m[IU]/mL (ref <5)

## 2017-09-29 LAB — ETHANOL: Alcohol, Ethyl (B): <10 mg/dL (ref <10)
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
## 2017-09-29 NOTE — Discharge Instructions (Signed)
Medical screening labs today are unremarkable.  You have also been evaluated for STDs, but these will not result for multiple days.  You will be called if these are positive.

## 2017-09-29 NOTE — ED Notes (Signed)
Per [facility], states she needs medically clearance-states she has been recently treated for STD but lost her pills-states she has been on a crack binge-WILL RETURN TO [facility] once medically cleared

## 2017-09-29 NOTE — ED Triage Notes (Signed)
Pt is alert and oriented x 4 and is verbally responsive. Pt reports crack/coaine use yesterday. Per [employer] pt is here for medical clearance, and was having some visual hallucinations. Pt denies SI/HI, and states that she is not having any auditory or visual hallucinations at this time. Pt denies pain at this time

## 2017-09-30 LAB — HIV ANTIBODY (ROUTINE TESTING W REFLEX): HIV Screen 4th Generation wRfx: NONREACTIVE

## 2017-09-30 LAB — GC/CHLAMYDIA PROBE AMP (~~LOC~~) NOT AT ARMC
Chlamydia: NEGATIVE
Neisseria Gonorrhea: NEGATIVE

## 2017-09-30 LAB — RPR: RPR Ser Ql: NONREACTIVE

## 2017-10-10 LAB — GLUCOSE, POCT (MANUAL RESULT ENTRY): POC Glucose: 91 mg/dl (ref 70–99)

## 2017-10-13 ENCOUNTER — Emergency Department (HOSPITAL_COMMUNITY): Payer: Medicaid Other

## 2017-10-13 ENCOUNTER — Emergency Department (HOSPITAL_COMMUNITY)
Admission: EM | Admit: 2017-10-13 | Discharge: 2017-10-13 | Disposition: A | Discharge status: Home / Self Care | Payer: Medicaid Other | Attending: Emergency Medicine | Admitting: Emergency Medicine

## 2017-10-13 ENCOUNTER — Encounter (HOSPITAL_COMMUNITY): Payer: Self-pay | Admitting: Emergency Medicine

## 2017-10-13 ENCOUNTER — Other Ambulatory Visit: Payer: Self-pay

## 2017-10-13 DIAGNOSIS — K8020 Calculus of gallbladder without cholecystitis without obstruction: Secondary | ICD-10-CM

## 2017-10-13 DIAGNOSIS — R1011 Right upper quadrant pain: Secondary | ICD-10-CM | POA: Diagnosis present

## 2017-10-13 DIAGNOSIS — R10811 Right upper quadrant abdominal tenderness: Secondary | ICD-10-CM

## 2017-10-13 DIAGNOSIS — R110 Nausea: Secondary | ICD-10-CM

## 2017-10-13 DIAGNOSIS — K2970 Gastritis, unspecified, without bleeding: Secondary | ICD-10-CM

## 2017-10-13 DIAGNOSIS — Z79899 Other long term (current) drug therapy: Secondary | ICD-10-CM | POA: Insufficient documentation

## 2017-10-13 DIAGNOSIS — J45909 Unspecified asthma, uncomplicated: Secondary | ICD-10-CM | POA: Diagnosis not present

## 2017-10-13 DIAGNOSIS — F17210 Nicotine dependence, cigarettes, uncomplicated: Secondary | ICD-10-CM | POA: Insufficient documentation

## 2017-10-13 LAB — COMPREHENSIVE METABOLIC PANEL
ALT: 45 U/L (ref 14–54)
AST: 36 U/L (ref 15–41)
Albumin: 3.3 g/dL — ABNORMAL LOW (ref 3.5–5.0)
Alkaline Phosphatase: 59 U/L (ref 38–126)
Anion gap: 4 — ABNORMAL LOW (ref 5–15)
BUN: 10 mg/dL (ref 6–20)
CO2: 26 mmol/L (ref 22–32)
Calcium: 9.7 mg/dL (ref 8.9–10.3)
Chloride: 108 mmol/L (ref 101–111)
Creatinine, Ser: 0.72 mg/dL (ref 0.44–1.00)
GFR calc Af Amer: >60 mL/min (ref >60)
GFR calc non Af Amer: >60 mL/min (ref >60)
Glucose, Bld: 74 mg/dL (ref 65–99)
Potassium: 4.1 mmol/L (ref 3.5–5.1)
Sodium: 138 mmol/L (ref 135–145)
Total Bilirubin: 0.3 mg/dL (ref 0.3–1.2)
Total Protein: 7.2 g/dL (ref 6.5–8.1)

## 2017-10-13 LAB — CBC
HCT: 40.6 % (ref 36.0–46.0)
Hemoglobin: 11.9 g/dL — ABNORMAL LOW (ref 12.0–15.0)
MCH: 22.3 pg — ABNORMAL LOW (ref 26.0–34.0)
MCHC: 29.3 g/dL — ABNORMAL LOW (ref 30.0–36.0)
MCV: 76.2 fL — ABNORMAL LOW (ref 78.0–100.0)
Platelets: 277 10*3/uL (ref 150–400)
RBC: 5.33 MIL/uL — ABNORMAL HIGH (ref 3.87–5.11)
RDW: 15.9 % — ABNORMAL HIGH (ref 11.5–15.5)
WBC: 4.6 10*3/uL (ref 4.0–10.5)

## 2017-10-13 LAB — URINALYSIS, ROUTINE W REFLEX MICROSCOPIC
Bilirubin Urine: NEGATIVE
Glucose, UA: NEGATIVE mg/dL
Hgb urine dipstick: NEGATIVE
Ketones, ur: NEGATIVE mg/dL
Leukocytes, UA: NEGATIVE
Nitrite: NEGATIVE
Protein, ur: NEGATIVE mg/dL
Specific Gravity, Urine: 1.004 — ABNORMAL LOW (ref 1.005–1.030)
pH: 7 (ref 5.0–8.0)

## 2017-10-13 LAB — LIPASE, BLOOD: Lipase: 41 U/L (ref 11–51)

## 2017-10-13 MED ORDER — ONDANSETRON 4 MG PO TBDP
4.0000 mg | ORAL_TABLET | Freq: Once | ORAL | Status: AC
  Administered 2017-10-13: 4 mg via ORAL
  Filled 2017-10-13: qty 1

## 2017-10-13 MED ORDER — RANITIDINE HCL 150 MG PO TABS: 150.0000 mg | ORAL_TABLET | Freq: Two times a day (BID) | ORAL | 0 refills | Status: DC

## 2017-10-13 MED ORDER — ONDANSETRON 4 MG PO TBDP: 4.0000 mg | ORAL_TABLET | Freq: Three times a day (TID) | ORAL | 0 refills | Status: DC | PRN

## 2017-10-13 MED ORDER — FAMOTIDINE 20 MG PO TABS
20.0000 mg | ORAL_TABLET | Freq: Once | ORAL | Status: AC
  Administered 2017-10-13: 20 mg via ORAL
  Filled 2017-10-13: qty 1

## 2017-10-13 MED ORDER — GI COCKTAIL ~~LOC~~
30.0000 mL | Freq: Once | ORAL | Status: AC
  Administered 2017-10-13: 30 mL via ORAL
  Filled 2017-10-13: qty 30

## 2017-10-13 NOTE — ED Notes (Signed)
Pt back from [facility]

## 2017-10-13 NOTE — ED Notes (Signed)
Pt verbalized understanding of discharge instructions and denies any further questions at this time.    Bus pass provided to patient.

## 2017-10-13 NOTE — ED Notes (Signed)
Pt placed in gown. She is eating a PB&J sandwich. Reports that she has been nauseated for 2 weeks and this is her first meal today.

## 2017-10-13 NOTE — Discharge Instructions (Signed)
Your work up today showed that you have gallstones, which could be part of why you're having nausea. You could also have reflux, gastritis, or an ulcer. You will need to take zantac as directed, and avoid spicy/fatty/acidic foods, avoid soda/coffee/tea/alcohol. Avoid laying down flat within 30 minutes of eating. Avoid NSAIDs like ibuprofen/aleve/motrin/etc on an empty stomach. May consider using over the counter tums/maalox as needed for additional relief. Use zofran as directed as needed for nausea. Use tylenol as needed for pain. Follow up with your regular doctor in 5-7 days for recheck of symptoms. You may also want to follow up with a surgeon in the next 1-2 months to discuss possible removal of your gallbladder if your symptoms continue to persist or get worse. Return to the ER for changes or worsening symptoms.  Abdominal (belly) pain can be caused by many things. Your caregiver performed an examination and possibly ordered blood/urine tests and imaging (CT scan, x-rays, ultrasound). Many cases can be observed and treated at home after initial evaluation in the emergency department. Even though you are being discharged home, abdominal pain can be unpredictable. Therefore, you need a repeated exam if your pain does not resolve, returns, or worsens. Most patients with abdominal pain don't have to be admitted to the hospital or have surgery, but serious problems like appendicitis and gallbladder attacks can start out as nonspecific pain. Many abdominal conditions cannot be diagnosed in one visit, so follow-up evaluations are very important. SEEK IMMEDIATE MEDICAL ATTENTION IF YOU DEVELOP ANY OF THE FOLLOWING SYMPTOMS: The pain does not go away or becomes severe.  A temperature above 101 develops.  Repeated vomiting occurs (multiple episodes).  The pain becomes localized to portions of the abdomen. The right side could possibly be appendicitis. In an adult, the left lower portion of the abdomen could be  colitis or diverticulitis.  Blood is being passed in stools or vomit (bright red or black tarry stools).  Return also if you develop chest pain, difficulty breathing, dizziness or fainting, or become confused, poorly responsive, or inconsolable (young children). The constipation stays for more than 4 days.  There is belly (abdominal) or rectal pain.  You do not seem to be getting better.

## 2017-10-13 NOTE — ED Notes (Signed)
Patient transported to Ultrasound 

## 2017-10-13 NOTE — ED Triage Notes (Addendum)
Pt states she has felt "hot/cold" for several weeks and nausea. Denies abd pain, cp, sob. Denies vomiting and dairrhea. "And while I'm here I'd like to be checked for STDs. It's been 6 months since I had intercourse." Pt denies any symptoms relating to STDs.

## 2017-10-13 NOTE — ED Provider Notes (Signed)
[facility] Provider Note   CSN: [id number] Arrival date & time: 10/13/17  4097     History   Chief Complaint Chief Complaint  Patient presents with  . Nausea    HPI Karen Best is a 62 y.o. female with a PMHx of asthma, bipolar disorder, schizophrenia, fibromyalgia, and other conditions listed below, with a PSHx of tubal ligation, who presents to the ED with complaints of hot and cold chills and nausea for 2 weeks.  She states that when she eats her nausea improves temporarily but then later returns.  She has not tried anything for her symptoms.  She states that being active for prolonged periods of time worsens her symptoms.  She also mentions that she has had some dysuria and increased urinary frequency recently.  She denies frequent NSAID use or alcohol use.  She denies fevers, CP, SOB, abd pain, vomiting, diarrhea/constipation, melena, hematochezia, hematuria, malodorous urine, vaginal bleeding/discharge, myalgias, arthralgias, numbness, tingling, focal weakness, or any other complaints at this time.   The history is provided by the patient and medical records. No language interpreter was used.    Past Medical History:  Diagnosis Date  . Asthma   . Bipolar 1 disorder ([place])   . Fibromyalgia   . Schizophrenia ([place])   . Tobacco abuse     Patient Active Problem List   Diagnosis Date Noted  . Psychosis ([place]) 09/16/2013  . Schizophrenia [facility])     Past Surgical History:  Procedure Laterality Date  . CESAREAN SECTION    . TUBAL LIGATION       OB History   None      Home Medications    Prior to Admission medications   Medication Sig Start Date End Date Taking? Authorizing Provider  beclomethasone (QVAR) 40 MCG/ACT inhaler Inhale 2 puffs into the lungs 2 (two) times daily.    [provider]  benzonatate (TESSALON) 100 MG capsule Take 1 capsule (100 mg total) by mouth every 8 (eight) hours. 08/21/17   Domenic Moras,  PA-C  benztropine (COGENTIN) 1 MG tablet Take 1 mg by mouth 2 (two) times daily as needed for tremors.     [provider]  doxycycline (VIBRAMYCIN) 100 MG capsule Take 1 capsule (100 mg total) by mouth 2 (two) times daily. One po bid x 7 days 09/05/17   Dorie Rank, MD  lithium carbonate (ESKALITH) 450 MG CR tablet Take 450 mg by mouth 2 (two) times daily.    [provider]  paliperidone (INVEGA) 6 MG 24 hr tablet Take 6 mg by mouth 2 (two) times daily.    [provider]    Family History History reviewed. No pertinent family history.  Social History Social History   Tobacco Use  . Smoking status: Current Every Day Smoker    Packs/day: 0.50    Types: Cigarettes  . Smokeless tobacco: Never Used  Substance Use Topics  . Alcohol use: Yes    Comment: occ  . Drug use: Yes    Comment: crack cocaine     Allergies   Penicillins   Review of Systems Review of Systems  Constitutional: Positive for chills. Negative for fever.  Respiratory: Negative for shortness of breath.   Cardiovascular: Negative for chest pain.  Gastrointestinal: Positive for nausea. Negative for abdominal pain, blood in stool, constipation, diarrhea and vomiting.  Genitourinary: Positive for dysuria and frequency. Negative for hematuria, vaginal bleeding and vaginal discharge.  No malodorous urine  Musculoskeletal: Negative for arthralgias and myalgias.  Skin: Negative for color change.  Allergic/Immunologic: Negative for immunocompromised state.  Neurological: Negative for weakness and numbness.  Psychiatric/Behavioral: Negative for confusion.   All other systems reviewed and are negative for acute change except as noted in the HPI.    Physical Exam Updated Vital Signs BP (!) 132/103 (BP Location: Right Arm)   Pulse 79   Temp 98 F (36.7 C) (Oral)   Resp 20   SpO2 100%   Physical Exam  Constitutional: She is oriented to person, place, and time. Vital signs are normal.  She appears well-developed and well-nourished.  Non-toxic appearance. No distress.  Afebrile, nontoxic, NAD  HENT:  Head: Normocephalic and atraumatic.  Mouth/Throat: Oropharynx is clear and moist and mucous membranes are normal.  Eyes: Conjunctivae and EOM are normal. Right eye exhibits no discharge. Left eye exhibits no discharge.  Neck: Normal range of motion. Neck supple.  Cardiovascular: Normal rate, regular rhythm, normal heart sounds and intact distal pulses. Exam reveals no gallop and no friction rub.  No murmur heard. Pulmonary/Chest: Effort normal and breath sounds normal. No respiratory distress. She has no decreased breath sounds. She has no wheezes. She has no rhonchi. She has no rales.  Abdominal: Soft. Normal appearance and bowel sounds are normal. She exhibits no distension. There is tenderness in the right upper quadrant and epigastric area. There is positive Murphy's sign. There is no rigidity, no rebound, no guarding, no CVA tenderness and no tenderness at McBurney's point.  Soft, nondistended, +BS throughout, with mild RUQ and epigastric TTP, no r/g/r, +pain elicited with murphy's and some halting of inspiration, neg mcburney's, no CVA TTP   Musculoskeletal: Normal range of motion.  Neurological: She is alert and oriented to person, place, and time. She has normal strength. No sensory deficit.  Skin: Skin is warm, dry and intact. No rash noted.  Psychiatric: She has a normal mood and affect.  Nursing note and vitals reviewed.    ED Treatments / Results  Labs (all labs ordered are listed, but only abnormal results are displayed) Labs Reviewed  COMPREHENSIVE METABOLIC PANEL - Abnormal; Notable for the following components:      Result Value   Albumin 3.3 (*)    Anion gap 4 (*)    All other components within normal limits  CBC - Abnormal; Notable for the following components:   RBC 5.33 (*)    Hemoglobin 11.9 (*)    MCV 76.2 (*)    MCH 22.3 (*)    MCHC 29.3 (*)     RDW 15.9 (*)    All other components within normal limits  URINALYSIS, ROUTINE W REFLEX MICROSCOPIC - Abnormal; Notable for the following components:   Color, Urine STRAW (*)    Specific Gravity, Urine 1.004 (*)    All other components within normal limits  LIPASE, BLOOD    EKG None  Radiology US Abdomen Limited Ruq  Result Date: 10/13/2017 CLINICAL DATA:  Right upper quadrant pain EXAM: ULTRASOUND ABDOMEN LIMITED RIGHT UPPER QUADRANT COMPARISON:  None. FINDINGS: Gallbladder: Within the gallbladder, there are echogenic foci which move and shadow consistent with cholelithiasis. Largest gallstone measures 7 mm in length. No gallbladder wall thickening or pericholecystic fluid. No sonographic Murphy sign noted by sonographer. Common bile duct: Diameter: 2 mm. No intrahepatic or extrahepatic biliary duct dilatation. Liver: No focal lesion identified. Within normal limits in parenchymal echogenicity. Portal vein is patent on color Doppler imaging with normal  direction of blood flow towards the liver. IMPRESSION: Cholelithiasis.  Study otherwise unremarkable. Electronically Signed   By: Lowella Grip III M.D.   On: 10/13/2017 13:08    Procedures Procedures (including critical care time)  Medications Ordered in ED Medications  gi cocktail (Maalox,Lidocaine,Donnatal) (30 mLs Oral Given 10/13/17 1319)  ondansetron (ZOFRAN-ODT) disintegrating tablet 4 mg (4 mg Oral Given 10/13/17 1320)  famotidine (PEPCID) tablet 20 mg (20 mg Oral Given 10/13/17 1320)     Initial Impression / Assessment and Plan / ED Course  I have reviewed the triage vital signs and the nursing notes.  Pertinent labs & imaging results that were available during my care of the patient were reviewed by me and considered in my medical decision making (see chart for details).     62 y.o. female here with c/o nausea and chills x2 wks, also having dysuria and urinary frequency. Denies abd pain, however on exam she has mild RUQ  and epigastric TTP, nonperitoneal but +pain elicited with murphy's exam and somewhat halts her inspiration. No lower abdominal tenderness. Work up thus far reveals: CBC with marginal anemia but otherwise fairly unremarkable; CMP WNL; lipase WNL. U/A not yet done. Will get RUQ U/S to ensure no gallbladder pathology, and give GI cocktail, pepcid, and zofran. Symptoms could be from PUD/gastritis/GERD as well. Will await U/A and U/S and then reassess shortly.   3:55 PM U/A unremarkable, no evidence of UTI. RUQ U/S with cholelithiasis but no evidence of cholecystitis. Pt feeling better and tolerating PO well here. Overall, symptoms could be from symptomatic cholelithiasis however she's tolerating PO well here and her symptoms are controlled, doubt need for emergent/urgent surgical consultation; symptoms could also be from gastritis/GERD/PUD. Discussed diet/lifestyle modifications for symptoms, will start on zantac/zofran, advised tylenol and avoidance/sparing use of NSAIDs only on full stomach, discussed other OTC remedies for symptomatic relief, and f/up with PCP in 5-7 days for recheck of symptoms and ongoing evaluation/management. Advised f/up with surgery in 1-2 months if symptoms persist, to discuss possible cholecystectomy. I explained the diagnosis and have given explicit precautions to return to the ER including for any other new or worsening symptoms. The patient understands and accepts the medical plan as it's been dictated and I have answered their questions. Discharge instructions concerning home care and prescriptions have been given. The patient is STABLE and is discharged to home in good condition.    Final Clinical Impressions(s) / ED Diagnoses   Final diagnoses:  RUQ abdominal tenderness  Nausea  Calculus of gallbladder without cholecystitis without obstruction  Gastritis, presence of bleeding unspecified, unspecified chronicity, unspecified gastritis type    ED Discharge Orders         Ordered    ranitidine (ZANTAC) 150 MG tablet  2 times daily     10/13/17 1551    ondansetron (ZOFRAN ODT) 4 MG disintegrating tablet  Every 8 hours PRN     10/13/17 [address] [place], [place] 10/13/17 1555    Quintella Reichert, MD 10/14/17 1331

## 2017-10-13 NOTE — ED Notes (Signed)
Pt given sandwich and water.

## 2017-10-17 ENCOUNTER — Encounter (HOSPITAL_COMMUNITY): Payer: Self-pay | Admitting: Emergency Medicine

## 2017-10-17 ENCOUNTER — Emergency Department (HOSPITAL_COMMUNITY): Payer: Medicaid Other

## 2017-10-17 ENCOUNTER — Emergency Department (HOSPITAL_COMMUNITY)
Admission: EM | Admit: 2017-10-17 | Discharge: 2017-10-17 | Disposition: A | Discharge status: Home / Self Care | Payer: Medicaid Other | Attending: Emergency Medicine | Admitting: Emergency Medicine

## 2017-10-17 DIAGNOSIS — F17210 Nicotine dependence, cigarettes, uncomplicated: Secondary | ICD-10-CM | POA: Diagnosis not present

## 2017-10-17 DIAGNOSIS — R1032 Left lower quadrant pain: Secondary | ICD-10-CM | POA: Diagnosis not present

## 2017-10-17 DIAGNOSIS — J45909 Unspecified asthma, uncomplicated: Secondary | ICD-10-CM | POA: Diagnosis not present

## 2017-10-17 LAB — COMPREHENSIVE METABOLIC PANEL
ALT: 56 U/L — ABNORMAL HIGH (ref 14–54)
AST: 46 U/L — ABNORMAL HIGH (ref 15–41)
Albumin: 3.8 g/dL (ref 3.5–5.0)
Alkaline Phosphatase: 59 U/L (ref 38–126)
Anion gap: 6 (ref 5–15)
BUN: 8 mg/dL (ref 6–20)
CO2: 24 mmol/L (ref 22–32)
Calcium: 9.6 mg/dL (ref 8.9–10.3)
Chloride: 102 mmol/L (ref 101–111)
Creatinine, Ser: 0.87 mg/dL (ref 0.44–1.00)
GFR calc Af Amer: >60 mL/min (ref >60)
GFR calc non Af Amer: >60 mL/min (ref >60)
Glucose, Bld: 83 mg/dL (ref 65–99)
Potassium: 3.8 mmol/L (ref 3.5–5.1)
Sodium: 132 mmol/L — ABNORMAL LOW (ref 135–145)
Total Bilirubin: 0.5 mg/dL (ref 0.3–1.2)
Total Protein: 8.2 g/dL — ABNORMAL HIGH (ref 6.5–8.1)

## 2017-10-17 LAB — URINALYSIS, ROUTINE W REFLEX MICROSCOPIC
Bilirubin Urine: NEGATIVE
Glucose, UA: NEGATIVE mg/dL
Hgb urine dipstick: NEGATIVE
Ketones, ur: NEGATIVE mg/dL
Leukocytes, UA: NEGATIVE
Nitrite: NEGATIVE
Protein, ur: NEGATIVE mg/dL
Specific Gravity, Urine: 1.021 (ref 1.005–1.030)
pH: 7 (ref 5.0–8.0)

## 2017-10-17 LAB — LIPASE, BLOOD: Lipase: 44 U/L (ref 11–51)

## 2017-10-17 LAB — CBC
HCT: 41.8 % (ref 36.0–46.0)
Hemoglobin: 12.3 g/dL (ref 12.0–15.0)
MCH: 22.4 pg — ABNORMAL LOW (ref 26.0–34.0)
MCHC: 29.4 g/dL — ABNORMAL LOW (ref 30.0–36.0)
MCV: 76.3 fL — ABNORMAL LOW (ref 78.0–100.0)
Platelets: 295 10*3/uL (ref 150–400)
RBC: 5.48 MIL/uL — ABNORMAL HIGH (ref 3.87–5.11)
RDW: 16.4 % — ABNORMAL HIGH (ref 11.5–15.5)
WBC: 7.6 10*3/uL (ref 4.0–10.5)

## 2017-10-17 MED ORDER — PANTOPRAZOLE SODIUM 20 MG PO TBEC: 20.0000 mg | DELAYED_RELEASE_TABLET | Freq: Every day | ORAL | 0 refills | Status: DC

## 2017-10-17 MED ORDER — DICYCLOMINE HCL 20 MG PO TABS: 20.0000 mg | ORAL_TABLET | Freq: Three times a day (TID) | ORAL | 0 refills | Status: DC

## 2017-10-17 MED ORDER — SODIUM CHLORIDE 0.9 % IV BOLUS
500.0000 mL | Freq: Once | INTRAVENOUS | Status: AC
  Administered 2017-10-17: 500 mL via INTRAVENOUS

## 2017-10-17 MED ORDER — IOHEXOL 300 MG/ML  SOLN
100.0000 mL | Freq: Once | INTRAMUSCULAR | Status: AC | PRN
  Administered 2017-10-17: 100 mL via INTRAVENOUS

## 2017-10-17 NOTE — ED Triage Notes (Signed)
Pt c/o abdominal pain, denies nausea/vomiting. Recent diagnosis of gallstones, states she has been taking prescribed medications with no relief.

## 2017-10-17 NOTE — ED Notes (Signed)
Pt back in room, pt requesting something to eat and drink. Pt informed we have to wait on results from CT then she can have something.

## 2017-10-17 NOTE — Discharge Instructions (Signed)

## 2017-10-17 NOTE — ED Notes (Signed)
Gave pt a [place] sandwich and ginger ale per MD.

## 2017-10-17 NOTE — ED Provider Notes (Signed)
Emergency Department Provider Note   I have reviewed the triage vital signs and the nursing notes.   HISTORY  Chief Complaint Abdominal Pain   HPI Karen Best is a 62 y.o. female with PMH of Bipolar disorder, asthma, and schizophrenia's to the emergency department for evaluation of continued abdominal pain with sensation of feeling hot all over and nausea.  Patient denies any vomiting or diarrhea.  He states that symptoms are worse when she is not eating and improved with eating.  Denies seeing any blood in the bowel movements.  She has had pain intermittently over the last 3 weeks.  She was seen in the emergency department earlier this month and had a an ultrasound which showed gallstones.  Patient denies any fevers or shaking chills.    Past Medical History:  Diagnosis Date  . Asthma   . Bipolar 1 disorder ([place])   . Fibromyalgia   . Schizophrenia ([place])   . Tobacco abuse     Patient Active Problem List   Diagnosis Date Noted  . Psychosis ([place]) 09/16/2013  . Schizophrenia [facility])     Past Surgical History:  Procedure Laterality Date  . CESAREAN SECTION    . TUBAL LIGATION       Allergies Penicillins  No family history on file.  Social History Social History   Tobacco Use  . Smoking status: Current Every Day Smoker    Packs/day: 0.50    Types: Cigarettes  . Smokeless tobacco: Never Used  Substance Use Topics  . Alcohol use: Yes    Comment: occ  . Drug use: Yes    Comment: crack cocaine    Review of Systems  Constitutional: No fever/chills Eyes: No visual changes. ENT: No sore throat. Cardiovascular: Denies chest pain. Respiratory: Denies shortness of breath. Gastrointestinal: Positive abdominal pain. Positive nausea, no vomiting.  No diarrhea.  No constipation. Genitourinary: Negative for dysuria. Musculoskeletal: Negative for back pain. Skin: Negative for rash. Neurological: Negative for headaches, focal weakness or numbness.  10-point  ROS otherwise negative.  ____________________________________________   PHYSICAL EXAM:  VITAL SIGNS: ED Triage Vitals  Enc Vitals Group     BP 10/17/17 1606 130/79     Pulse Rate 10/17/17 1606 85     Resp 10/17/17 1606 16     Temp 10/17/17 1606 98.5 F (36.9 C)     Temp Source 10/17/17 1606 Oral     SpO2 10/17/17 1606 100 %     Pain Score 10/17/17 1609 4   Constitutional: Alert and oriented. Well appearing and in no acute distress. Eyes: Conjunctivae are normal. Head: Atraumatic. Nose: No congestion/rhinnorhea. Mouth/Throat: Mucous membranes are moist. Neck: No stridor.  Cardiovascular: Normal rate, regular rhythm. Good peripheral circulation. Grossly normal heart sounds.   Respiratory: Normal respiratory effort.  No retractions. Lungs CTAB. Gastrointestinal: Soft without epigastric or RUQ tenderness. Positive LLQ tenderness to palpation without guarding or rebound. No distention.  Musculoskeletal: No lower extremity tenderness nor edema. No gross deformities of extremities. Neurologic:  Normal speech and language. No gross focal neurologic deficits are appreciated.  Skin:  Skin is warm, dry and intact. No rash noted.  ____________________________________________   LABS (all labs ordered are listed, but only abnormal results are displayed)  Labs Reviewed  COMPREHENSIVE METABOLIC PANEL - Abnormal; Notable for the following components:      Result Value   Sodium 132 (*)    Total Protein 8.2 (*)    AST 46 (*)    ALT 56 (*)  All other components within normal limits  CBC - Abnormal; Notable for the following components:   RBC 5.48 (*)    MCV 76.3 (*)    MCH 22.4 (*)    MCHC 29.4 (*)    RDW 16.4 (*)    All other components within normal limits  URINALYSIS, ROUTINE W REFLEX MICROSCOPIC - Abnormal; Notable for the following components:   Color, Urine STRAW (*)    All other components within normal limits  LIPASE, BLOOD    ____________________________________________  RADIOLOGY  Ct Abdomen Pelvis W Contrast  Result Date: 10/17/2017 CLINICAL DATA:  Generalized abdominal pain. EXAM: CT ABDOMEN AND PELVIS WITH CONTRAST TECHNIQUE: Multidetector CT imaging of the abdomen and pelvis was performed using the standard protocol following bolus administration of intravenous contrast. CONTRAST:  158mL OMNIPAQUE IOHEXOL 300 MG/ML  SOLN COMPARISON:  CT scan of Sep 03, 2014. FINDINGS: Lower chest: No acute abnormality. Hepatobiliary: Mild cholelithiasis is noted without inflammation. The liver appears normal. No biliary dilatation is noted. Pancreas: Unremarkable. No pancreatic ductal dilatation or surrounding inflammatory changes. Spleen: Normal in size without focal abnormality. Adrenals/Urinary Tract: Adrenal glands are unremarkable. Kidneys are normal, without renal calculi, focal lesion, or hydronephrosis. Bladder is unremarkable. Stomach/Bowel: Stomach is within normal limits. Appendix appears normal. No evidence of bowel wall thickening, distention, or inflammatory changes. Stool is noted throughout the colon. Vascular/Lymphatic: No significant vascular findings are present. No enlarged abdominal or pelvic lymph nodes. Reproductive: Uterus and bilateral adnexa are unremarkable. Other: No abdominal wall hernia or abnormality. No abdominopelvic ascites. Musculoskeletal: No acute or significant osseous findings. IMPRESSION: Mild cholelithiasis without inflammation. Stool is noted throughout the colon. No acute abnormality seen in the abdomen or pelvis. Electronically Signed   By: Marijo Conception, M.D.   On: 10/17/2017 18:50    ____________________________________________   PROCEDURES  Procedure(s) performed:   Procedures  None ____________________________________________   INITIAL IMPRESSION / ASSESSMENT AND PLAN / ED COURSE  Pertinent labs & imaging results that were available during my care of the patient were  reviewed by me and considered in my medical decision making (see chart for details).  Patient presents to the emergency department for evaluation of continued abdominal pain over the past 3 weeks.  Right upper quadrant ultrasound from prior ED visit shows gallstones with no other acute findings.  Patient's clinical presentation is less consistent with symptomatic cholelithiasis.  She has improved pain with eating and on my exam has no right upper quadrant or epigastric tenderness.  She does have focal tenderness over the left lower quadrant however does not exhibit any voluntary guarding or rebound tenderness.  Given the patient's age and return presentation plan for CT of the abdomen and pelvis to evaluate for possible diverticulitis.  No concern clinically for vascular etiology of symptoms.   Labs and UA reviewed with no acute findings. Patient CT unremarkable. Patient tolerating PO without pain or nausea/vomiting.   At this time, I do not feel there is any life-threatening condition present. I have reviewed and discussed all results (EKG, imaging, lab, urine as appropriate), exam findings with patient. I have reviewed nursing notes and appropriate previous records.  I feel the patient is safe to be discharged home without further emergent workup. Discussed usual and customary return precautions. Patient and family (if present) verbalize understanding and are comfortable with this plan.  Patient will follow-up with their primary care provider. If they do not have a primary care provider, information for follow-up has been provided to them.  All questions have been answered.  ____________________________________________  FINAL CLINICAL IMPRESSION(S) / ED DIAGNOSES  Final diagnoses:  Left lower quadrant pain     MEDICATIONS GIVEN DURING THIS VISIT:  Medications  sodium chloride 0.9 % bolus 500 mL (0 mLs Intravenous Stopped 10/17/17 2003)  iohexol (OMNIPAQUE) 300 MG/ML solution 100 mL (100 mLs  Intravenous Contrast Given 10/17/17 1823)     NEW OUTPATIENT MEDICATIONS STARTED DURING THIS VISIT:  Discharge Medication List as of 10/17/2017  9:20 PM    START taking these medications   Details  dicyclomine (BENTYL) 20 MG tablet Take 1 tablet (20 mg total) by mouth 3 (three) times daily before meals., Starting Sat 10/17/2017, Print    pantoprazole (PROTONIX) 20 MG tablet Take 1 tablet (20 mg total) by mouth daily., Starting Sat 10/17/2017, Until Mon 11/16/2017, Print        Note:  This document was prepared using Dragon voice recognition software and may include unintentional dictation errors.  Nanda Quinton, MD Emergency Medicine    Chirag Krueger, [facility], MD 10/17/17 2252

## 2017-10-17 NOTE — ED Notes (Signed)
Patient transported to CT 

## 2018-06-08 ENCOUNTER — Ambulatory Visit (INDEPENDENT_AMBULATORY_CARE_PROVIDER_SITE_OTHER): Payer: Medicaid Other | Admitting: Infectious Diseases

## 2018-06-08 ENCOUNTER — Encounter: Payer: Self-pay | Admitting: Infectious Diseases

## 2018-06-08 VITALS — BP 127/79 | HR 71 | Temp 98.8°F | Wt 139.0 lb

## 2018-06-08 DIAGNOSIS — R768 Other specified abnormal immunological findings in serum: Secondary | ICD-10-CM

## 2018-06-08 DIAGNOSIS — B182 Chronic viral hepatitis C: Secondary | ICD-10-CM | POA: Diagnosis not present

## 2018-06-08 NOTE — Patient Instructions (Addendum)
Please fax a complete record of medications and supplements you are taking before we start your medications.   Will call you and [name] with results of todays blood work to determine if you need treatment again.   Nice to meet you both!

## 2018-06-08 NOTE — Progress Notes (Signed)
Patient Name: Karen Best  Date of Birth: October 10, 1955  MRN: [id number]  PCP: [facility]  Referring Provider: PCP  Patient Active Problem List   Diagnosis Date Noted  . 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 Psychosis ([place]) 09/16/2013  . Schizophrenia ([place])    CC:  New patient for hepatitis c infection, evaluation for treatment.   HPI/ROS:  Karen Best is a 63 y.o. female. She is here today with her care provider, Chloe who facilitates the history.   The patient tells me that she was treated for Hepatitis C "30 years ago" with shots and pills. She recalls that she had to go to what she believes was the health department many times for treatment and was under the impression she was cured. She was notified in November that "hepatitis was back." She is supposed to have gallbladder surgery soon with chronic GI nausea and epigastric pain however she has been reluctant to have this done and yet to meet with surgeon team. She has no knowledge of what the blood work specifically stated. She tells me that she has a history of intranasal drug use (cocaine + 09-2017) and may have shared straws at a time. Never injection drug use. She also denies blood transfusion, military, dialysis, sexual encounter with known partner with hepatitis or tattoos.   She is currently not feeling well and has abdominal pain. She denies any signs of liver disease/dysfunction including yellowing of eyes, dark urine/stool, rashes, abdominal bloating/lower extremity swelling or bleeding/easy bruising.   She denies any family history of liver disease or cancer that is known to her. She does not drink alcohol regularly.   Constitutional: positive for anorexia, negative for fevers, chills and malaise Eyes: negative for icterus Respiratory: negative for cough or dyspnea on exertion Cardiovascular: negative for orthopnea, lower extremity edema Gastrointestinal: positive for  abdominal pain and nausea; negative for melena and jaundice Hematologic/lymphatic: negative for easy bruising and bleeding Neurological: negative for headaches, gait problems and tremor All other systems reviewed and are negative      Past Medical History:  Diagnosis Date  . Asthma   . Bipolar 1 disorder ([place])   . Fibromyalgia   . Schizophrenia ([place])   . Tobacco abuse     Prior to Admission medications   Medication Sig Start Date End Date Taking? Authorizing Provider  benzonatate (TESSALON) 100 MG capsule Take 1 capsule (100 mg total) by mouth every 8 (eight) hours. Patient not taking: Reported on 10/13/2017 08/21/17   Domenic Moras, PA-C  dicyclomine (BENTYL) 20 MG tablet Take 1 tablet (20 mg total) by mouth 3 (three) times daily before meals. 10/17/17   Long, Wonda Olds, MD  doxycycline (VIBRAMYCIN) 100 MG capsule Take 1 capsule (100 mg total) by mouth 2 (two) times daily. One po bid x 7 days Patient not taking: Reported on 10/13/2017 09/05/17   Dorie Rank, MD  lithium carbonate (ESKALITH) 450 MG CR tablet Take 450 mg by mouth 2 (two) times daily.    [provider]  ondansetron (ZOFRAN ODT) 4 MG disintegrating tablet Take 1 tablet (4 mg total) by mouth every 8 (eight) hours as needed for nausea or vomiting. 10/13/17   Street, [place], PA-C  paliperidone (INVEGA) 6 MG 24 hr tablet Take 6 mg by mouth 2 (two) times daily.    [provider]  pantoprazole (PROTONIX) 20 MG tablet Take 1 tablet (20 mg total) by mouth daily. 10/17/17 11/16/17  Long, Wonda Olds,  MD  ranitidine (ZANTAC) 150 MG tablet Take 1 tablet (150 mg total) by mouth 2 (two) times daily. 10/13/17   Street, [place], PA-C    Allergies  Allergen Reactions  . Penicillins Other (See Comments)    Child hood reaction Has patient had a PCN reaction causing immediate rash, facial/tongue/throat swelling, SOB or lightheadedness with hypotension: Yes Has patient had a PCN reaction causing severe rash involving mucus membranes  or skin necrosis: No Has patient had a PCN reaction that required hospitalization No Has patient had a PCN reaction occurring within the last 10 years: No If all of the above answers are "NO", then may proceed with C    Social History   Tobacco Use  . Smoking status: Current Every Day Smoker    Packs/day: 0.50    Types: Cigarettes  . Smokeless tobacco: Never Used  Substance Use Topics  . Alcohol use: Yes    Comment: occ  . Drug use: Yes    Comment: crack cocaine    Family History  Problem Relation Age of Onset  . Liver cancer Neg Hx   . Liver disease Neg Hx     Objective:   Vitals:   06/08/18 1348  BP: 127/79  Pulse: 71  Temp: 98.8 F (37.1 C)   Constitutional: in no apparent distress, alert and oriented times 3 Eyes: anicteric Cardiovascular: Cor RRR and No murmurs Respiratory: clear Gastrointestinal: Bowel sounds are normal, liver is not enlarged, spleen is not enlarged Musculoskeletal: peripheral pulses normal, no pedal edema, no clubbing or cyanosis Skin: negative for - jaundice, spider hemangioma, telangiectasia, palmar erythema, ecchymosis and atrophy; no porphyria cutanea tarda Lymphatic: no cervical lymphadenopathy   Laboratory: Genotype: No results found for: HCVGENOTYPE HCV viral load: No results found for: HCVQUANT Lab Results  Component Value Date   WBC 9.3 06/08/2018   HGB 12.6 06/08/2018   HCT 39.8 06/08/2018   MCV 73.6 (L) 06/08/2018   PLT 380 06/08/2018    Lab Results  Component Value Date   CREATININE 0.82 06/08/2018   BUN 8 06/08/2018   NA 136 06/08/2018   K 4.6 06/08/2018   CL 106 06/08/2018   CO2 24 06/08/2018    Lab Results  Component Value Date   ALT 16 06/08/2018   AST 19 06/08/2018   ALKPHOS 59 10/17/2017    Lab Results  Component Value Date   BILITOT 0.4 06/08/2018   ALBUMIN 3.8 10/17/2017   Imaging:  Assessment & Plan:   Problem List Items Addressed This Visit      Unprioritized   Positive hepatitis C antibody  test    New patient here to discuss hepatitis treatment. I have no labs to suggest any previous documentation of positive RNA or even an antibody. I called her GI team to get a copy of her recent LFTs and they were last normal. There is a chance that she has cleared the infection on her own or she had successful treatment in the past (although it does not sound like she was actually treated). Will check RNA today to start. If this is positive will have her come back for more lab work and meet with pharmacy team to complete necessary Medicaid paperwork. She will need to bring a list of current medications with her as well since she left that at home today so we can carefully assess for DDI if she needs treatment.   I discussed with the patient the lab findings that confirm chronic hepatitis C as well  as the natural history and progression of disease including about 30% of people who develop cirrhosis of the liver if left untreated and once cirrhosis is established there is a 2-7% risk per year of liver cancer and liver failure.  I discussed the importance of treatment and benefits in reducing the risk, even if significant liver fibrosis exists. I also discussed risk for re-infection following treatment should he not continue to modify risk factors.    Patient counseled extensively on limiting acetaminophen to no more than 2 grams daily, avoidance of alcohol.  Transmission discussed with patient including sexual transmission, sharing razors and toothbrush.   Will need referral to gastroenterology if concern for cirrhosis  No need for ADS referral; no ETOH use    Will prescribe appropriate medication based on genotype and coverage   Hepatitis A and B titers to be drawn today with appropriate vaccinations as needed   Pneumovax vaccine at upcoming visit if not previously given  Further work up to include liver staging through non-invasive serum analysis with APRI and FIB4 scores and Liver Fibrosis  panel; U/S to follow if discordant or concerning results.  Will call AVERIL DIGMAN back once all results are in and counsel on medication over the phone.         Other Visit Diagnoses    Chronic hepatitis C with hepatic coma ([place])    -  Primary   Relevant Orders   Hepatitis C RNA quantitative (Completed)   CBC (Completed)   Comprehensive metabolic panel (Completed)     I spent 45 minutes with the patient including greater than 70% of time in face to face counsel of the patient re hepatitis c and the details described above and in coordination of their care.  Janene Madeira, MSN, NP-C [facility] for Infectious Disease [email or web address] Pager: [phone] Office: [phone]

## 2018-06-10 LAB — COMPREHENSIVE METABOLIC PANEL
AG Ratio: 0.9 (calc) — ABNORMAL LOW (ref 1.0–2.5)
ALT: 16 U/L (ref 6–29)
AST: 19 U/L (ref 10–35)
Albumin: 3.9 g/dL (ref 3.6–5.1)
Alkaline phosphatase (APISO): 71 U/L (ref 37–153)
BUN: 8 mg/dL (ref 7–25)
CO2: 24 mmol/L (ref 20–32)
Calcium: 10.2 mg/dL (ref 8.6–10.4)
Chloride: 106 mmol/L (ref 98–110)
Creat: 0.82 mg/dL (ref 0.50–0.99)
Globulin: 4.2 g/dL (calc) — ABNORMAL HIGH (ref 1.9–3.7)
Glucose, Bld: 89 mg/dL (ref 65–99)
Potassium: 4.6 mmol/L (ref 3.5–5.3)
Sodium: 136 mmol/L (ref 135–146)
Total Bilirubin: 0.4 mg/dL (ref 0.2–1.2)
Total Protein: 8.1 g/dL (ref 6.1–8.1)

## 2018-06-10 LAB — CBC
HCT: 39.8 % (ref 35.0–45.0)
Hemoglobin: 12.6 g/dL (ref 11.7–15.5)
MCH: 23.3 pg — ABNORMAL LOW (ref 27.0–33.0)
MCHC: 31.7 g/dL — ABNORMAL LOW (ref 32.0–36.0)
MCV: 73.6 fL — ABNORMAL LOW (ref 80.0–100.0)
MPV: 10.5 fL (ref 7.5–12.5)
Platelets: 380 10*3/uL (ref 140–400)
RBC: 5.41 10*6/uL — ABNORMAL HIGH (ref 3.80–5.10)
RDW: 14.6 % (ref 11.0–15.0)
WBC: 9.3 10*3/uL (ref 3.8–10.8)

## 2018-06-10 LAB — HEPATITIS C RNA QUANTITATIVE
HCV Quantitative Log: 6.81 Log IU/mL — ABNORMAL HIGH
HCV RNA, PCR, QN: 6420000 IU/mL — ABNORMAL HIGH

## 2018-06-11 ENCOUNTER — Encounter: Payer: Self-pay | Admitting: Infectious Diseases

## 2018-06-11 ENCOUNTER — Telehealth: Payer: Self-pay | Admitting: Infectious Diseases

## 2018-06-11 DIAGNOSIS — R768 Other specified abnormal immunological findings in serum: Secondary | ICD-10-CM | POA: Insufficient documentation

## 2018-06-11 DIAGNOSIS — B182 Chronic viral hepatitis C: Secondary | ICD-10-CM

## 2018-06-11 NOTE — Assessment & Plan Note (Signed)
New patient here to discuss hepatitis treatment. I have no labs to suggest any previous documentation of positive RNA or even an antibody. I called her GI team to get a copy of her recent LFTs and they were last normal. There is a chance that she has cleared the infection on her own or she had successful treatment in the past (although it does not sound like she was actually treated). Will check RNA today to start. If this is positive will have her come back for more lab work and meet with pharmacy team to complete necessary Medicaid paperwork. She will need to bring a list of current medications with her as well since she left that at home today so we can carefully assess for DDI if she needs treatment.   I discussed with the patient the lab findings that confirm chronic hepatitis C as well as the natural history and progression of disease including about 30% of people who develop cirrhosis of the liver if left untreated and once cirrhosis is established there is a 2-7% risk per year of liver cancer and liver failure.  I discussed the importance of treatment and benefits in reducing the risk, even if significant liver fibrosis exists. I also discussed risk for re-infection following treatment should he not continue to modify risk factors.    Patient counseled extensively on limiting acetaminophen to no more than 2 grams daily, avoidance of alcohol.  Transmission discussed with patient including sexual transmission, sharing razors and toothbrush.   Will need referral to gastroenterology if concern for cirrhosis  No need for ADS referral; no ETOH use    Will prescribe appropriate medication based on genotype and coverage   Hepatitis A and B titers to be drawn today with appropriate vaccinations as needed   Pneumovax vaccine at upcoming visit if not previously given  Further work up to include liver staging through non-invasive serum analysis with APRI and FIB4 scores and Liver Fibrosis panel; U/S  to follow if discordant or concerning results.  Will call CERYS WINGET back once all results are in and counsel on medication over the phone.

## 2018-06-11 NOTE — Telephone Encounter (Signed)
Her Hepatitis C labs returned positive with > 6 million copies.   I tried to call Karen Best however her listed number does not have a voicemail.   I called her caregiver Karen Best [phone]) to discuss results and that she will need treatment. The patient reported treatment in the past with a series of shots but being she said she only had 4 shots I don't think she is referring to previous interferon treatment. Eitherway she is DAA naive.   She has Medicaid and will submit for Mavyret x 8w as long as there are no interactions with her mental health medications.   APRI score 0.143 FIB-4 0.76 >> both methods of non-invasive liver staging indicate that the patient is statistically very low risk for fibrosis (Metavir F0). Will obtain FibroTest for further reassurance that we can consider deferring elastography (she also has known gallbladder disease awaiting cholecystectomy which I believe can interfere with results).   Lab orders have been entered and appt made for Tuesday Feb 18th @ 3:30 for labs and 4:00 for pharmacy to discuss medication and complete Medicaid paperwork for PA.   Karen Best had no further questions.   Karen Madeira, MSN, NP-C [facility] for Infectious Disease [email or web address] Pager: [phone] Office: [phone]

## 2018-06-15 ENCOUNTER — Other Ambulatory Visit: Payer: Medicaid Other

## 2018-06-15 ENCOUNTER — Other Ambulatory Visit: Payer: Self-pay

## 2018-06-15 ENCOUNTER — Ambulatory Visit (INDEPENDENT_AMBULATORY_CARE_PROVIDER_SITE_OTHER): Payer: Medicaid Other | Admitting: Pharmacist

## 2018-06-15 DIAGNOSIS — R768 Other specified abnormal immunological findings in serum: Secondary | ICD-10-CM

## 2018-06-15 DIAGNOSIS — B182 Chronic viral hepatitis C: Secondary | ICD-10-CM

## 2018-06-15 DIAGNOSIS — Z114 Encounter for screening for human immunodeficiency virus [HIV]: Secondary | ICD-10-CM

## 2018-06-15 NOTE — Progress Notes (Signed)
HPI: Karen Best is a 63 y.o. female who presents to the [facility] for Hepatitis C follow-up.   Patient Active Problem List   Diagnosis Date Noted  . 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 Psychosis ([place]) 09/16/2013  . Schizophrenia ([place])     Patient's Medications  New Prescriptions   No medications on file  Previous Medications   BENZONATATE (TESSALON) 100 MG CAPSULE    Take 1 capsule (100 mg total) by mouth every 8 (eight) hours.   DICYCLOMINE (BENTYL) 20 MG TABLET    Take 1 tablet (20 mg total) by mouth 3 (three) times daily before meals.   DOXYCYCLINE (VIBRAMYCIN) 100 MG CAPSULE    Take 1 capsule (100 mg total) by mouth 2 (two) times daily. One po bid x 7 days   LITHIUM CARBONATE (ESKALITH) 450 MG CR TABLET    Take 450 mg by mouth 2 (two) times daily.   ONDANSETRON (ZOFRAN ODT) 4 MG DISINTEGRATING TABLET    Take 1 tablet (4 mg total) by mouth every 8 (eight) hours as needed for nausea or vomiting.   PALIPERIDONE (INVEGA) 6 MG 24 HR TABLET    Take 6 mg by mouth 2 (two) times daily.   PANTOPRAZOLE (PROTONIX) 20 MG TABLET    Take 1 tablet (20 mg total) by mouth daily.   RANITIDINE (ZANTAC) 150 MG TABLET    Take 1 tablet (150 mg total) by mouth 2 (two) times daily.  Modified Medications   No medications on file  Discontinued Medications   No medications on file    Allergies: Allergies  Allergen Reactions  . Penicillins Other (See Comments)    Child hood reaction Has patient had a PCN reaction causing immediate rash, facial/tongue/throat swelling, SOB or lightheadedness with hypotension: Yes Has patient had a PCN reaction causing severe rash involving mucus membranes or skin necrosis: No Has patient had a PCN reaction that required hospitalization No Has patient had a PCN reaction occurring within the last 10 years: No If all of the above answers are "NO", then may proceed with C    Past Medical History: Past Medical History:  Diagnosis Date  .  Asthma   . Bipolar 1 disorder ([place])   . Fibromyalgia   . Schizophrenia ([place])   . Tobacco abuse     Social History: Social History   Socioeconomic History  . Marital status: Legally Separated    Spouse name: Not on file  . Number of children: Not on file  . Years of education: Not on file  . Highest education level: Not on file  Occupational History  . Not on file  Social Needs  . Financial resource strain: Not on file  . Food insecurity:    Worry: Not on file    Inability: Not on file  . Transportation needs:    Medical: Not on file    Non-medical: Not on file  Tobacco Use  . Smoking status: Current Every Day Smoker    Packs/day: 0.50    Types: Cigarettes  . Smokeless tobacco: Never Used  Substance and Sexual Activity  . Alcohol use: Yes    Comment: occ  . Drug use: Yes    Comment: crack cocaine  . Sexual activity: Yes    Birth control/protection: None  Lifestyle  . Physical activity:    Days per week: Not on file    Minutes per session: Not on file  . Stress: Not on file  Relationships  .  Social connections:    Talks on phone: Not on file    Gets together: Not on file    Attends religious service: Not on file    Active member of club or organization: Not on file    Attends meetings of clubs or organizations: Not on file    Relationship status: Not on file  Other Topics Concern  . Not on file  Social History Narrative  . Not on file    Labs: Hepatitis C Lab Results  Component Value Date   HCVRNAPCRQN 6,420,000 (H) 06/08/2018   Hepatitis B No results found for: HEPBSAB, HEPBSAG, HEPBCAB Hepatitis A No results found for: HAV HIV Lab Results  Component Value Date   HIV Non Reactive 09/29/2017   HIV Non Reactive 11/01/2015   HIV Non Reactive 05/26/2015   HIV Non Reactive 08/29/2014   HIV NONREACTIVE 04/24/2014   Lab Results  Component Value Date   CREATININE 0.82 06/08/2018   CREATININE 0.87 10/17/2017   CREATININE 0.72 10/13/2017    CREATININE 0.69 09/29/2017   CREATININE 0.74 08/21/2017   Lab Results  Component Value Date   AST 19 06/08/2018   AST 46 (H) 10/17/2017   AST 36 10/13/2017   ALT 16 06/08/2018   ALT 56 (H) 10/17/2017   ALT 45 10/13/2017    Assessment: Karen Best is here today to follow-up for her chronic Hepatitis C infection. She had lab work drawn at last visit with [facility] [place] which confirmed chronic Hepatitis C infection.  She was brought in today to draw remaining labs needed for approval and to sign medicaid paperwork.  I also obtained a medication list from her caregiver and updated the list in epic.  There are no drug-drug interactions with her medications and Mavyret.  She admits to using crack cocaine this morning, so we will see if Medicaid will approve her for treatment.   Plan: - Hepatitis C labs - Apply for Mavyret through Medicaid  Zai Chmiel L. Ajna Moors, PharmD, BCIDP, AAHIVP, [place] for Infectious Disease 06/15/2018, 4:34 PM

## 2018-06-23 LAB — LIVER FIBROSIS, FIBROTEST-ACTITEST
ALT: 21 U/L (ref 6–29)
Alpha-2-Macroglobulin: 401 mg/dL — ABNORMAL HIGH (ref 106–279)
Apolipoprotein A1: 117 mg/dL (ref 101–198)
Bilirubin: 0.3 mg/dL (ref 0.2–1.2)
Fibrosis Score: 0.39
GGT: 18 U/L (ref 3–65)
Haptoglobin: 216 mg/dL — ABNORMAL HIGH (ref 43–212)
Necroinflammat ACT Score: 0.1
Reference ID: 2867797

## 2018-06-23 LAB — PROTIME-INR
INR: 1
Prothrombin Time: 10.4 s (ref 9.0–11.5)

## 2018-06-23 LAB — HIV ANTIBODY (ROUTINE TESTING W REFLEX): HIV 1&2 Ab, 4th Generation: NONREACTIVE

## 2018-06-23 LAB — HEPATITIS B SURFACE ANTIBODY,QUALITATIVE: Hep B S Ab: REACTIVE — AB

## 2018-06-23 LAB — HEPATITIS C GENOTYPE

## 2018-06-23 LAB — HEPATITIS A ANTIBODY, TOTAL: Hepatitis A AB,Total: REACTIVE — AB

## 2018-06-23 LAB — HEPATITIS B CORE ANTIBODY, TOTAL: Hep B Core Total Ab: NONREACTIVE

## 2018-06-23 LAB — HEPATITIS B SURFACE ANTIGEN: Hepatitis B Surface Ag: NONREACTIVE

## 2018-06-28 NOTE — Progress Notes (Signed)
Patient with 1b chronic hep c - FibroTest matches APRI/FIB4 in that she is statistically at low risk for advanced fibrosis with Metavir F1/2. Will notify pharmacy team to proceed with [place] x 8w for treatment and reach out to Aysha's caregiver (mobile # listed in Epic).

## 2018-06-30 ENCOUNTER — Telehealth: Payer: Self-pay | Admitting: Pharmacy Technician

## 2018-06-30 ENCOUNTER — Other Ambulatory Visit: Payer: Self-pay | Admitting: Pharmacist

## 2018-06-30 DIAGNOSIS — B182 Chronic viral hepatitis C: Secondary | ICD-10-CM

## 2018-06-30 MED ORDER — GLECAPREVIR-PIBRENTASVIR 100-40 MG PO TABS: 3.0000 | ORAL_TABLET | Freq: Every day | ORAL | 1 refills | Status: DC

## 2018-06-30 NOTE — Progress Notes (Signed)
Patient with Hepatitis C genotype 1b, F1/F2 fibrosis, and an initial Hep C viral load of 6.4 million.  Will send in [place] x 8 weeks to [place] will begin insurance process.

## 2018-06-30 NOTE — Telephone Encounter (Signed)
[facility] Patient Advocate Encounter   Received notification from [facility] that prior authorization for Karen Best is required.   PA submitted on 06/30/2018 Key [id number] W Status is pending    [facility] will continue to follow.   Venida Jarvis. Nadara Mustard [place] Patient [facility] for Infectious Disease Phone: [phone] Fax:  [phone]

## 2018-06-30 NOTE — Telephone Encounter (Signed)
Thank you, Inez Catalina.   Fabulous triage team - can you please reach out to [place] care taker [name] (listed as mobile # in her chart) to see if we can get her into a few sessions with our counselors regarding her cocaine use? I would like to get her hepatitis c treated and help her with her addiction. Thank you

## 2018-06-30 NOTE — Telephone Encounter (Addendum)
[facility] Patient Advocate Encounter  Received notification from [place] [employer] that the request for prior authorization for Mavyret has been denied.  I called to ask for the reason and it is due to recent cocaine usage that was disclosed when Ms. Vanhecke filled out the Readiness to Treat form.    Ms. Arbogast will need to get into a rehab/counseling program before being approved.  She does not have to be in a program for any specific amount of time before being approved.    Venida Jarvis. Nadara Mustard [place] Patient [facility] for Infectious Disease Phone: [phone] Fax:  [phone]

## 2018-07-01 ENCOUNTER — Telehealth: Payer: Self-pay | Admitting: Pharmacy Technician

## 2018-07-01 NOTE — Telephone Encounter (Signed)
I left a message for Ms. Karen Best to call me back.  I will explain [place] due to cocaine usage and once she is in counseling we can reapply and hopefully get her approved.  Karen Best has agreed to meet with her.  I will set up an appointment once we can arrange a date/time.  Karen Best. Karen Best for Infectious Disease Phone: [phone] Fax:  [phone]

## 2018-07-01 NOTE — Telephone Encounter (Signed)
Thank you so much

## 2018-07-12 NOTE — Telephone Encounter (Signed)
Nurse [name] called back and I explained the situation.  She stated she will write a letter giving information on her treatment status that will help to get her approved through [facility] Medicaid.  I will continue to follow.  Venida Jarvis. Nadara Mustard [place] Patient [facility] for Infectious Disease Phone: [phone] Fax:  [phone]

## 2018-07-28 ENCOUNTER — Emergency Department (HOSPITAL_COMMUNITY): Payer: Medicaid Other

## 2018-07-28 ENCOUNTER — Emergency Department (HOSPITAL_COMMUNITY)
Admission: EM | Admit: 2018-07-28 | Discharge: 2018-07-28 | Disposition: A | Discharge status: Home / Self Care | Payer: Medicaid Other | Attending: Emergency Medicine | Admitting: Emergency Medicine

## 2018-07-28 DIAGNOSIS — N3000 Acute cystitis without hematuria: Secondary | ICD-10-CM | POA: Insufficient documentation

## 2018-07-28 DIAGNOSIS — R260 Ataxic gait: Secondary | ICD-10-CM | POA: Diagnosis present

## 2018-07-28 DIAGNOSIS — Z79899 Other long term (current) drug therapy: Secondary | ICD-10-CM | POA: Insufficient documentation

## 2018-07-28 DIAGNOSIS — R531 Weakness: Secondary | ICD-10-CM | POA: Insufficient documentation

## 2018-07-28 DIAGNOSIS — Z880 Allergy status to penicillin: Secondary | ICD-10-CM | POA: Insufficient documentation

## 2018-07-28 DIAGNOSIS — F17210 Nicotine dependence, cigarettes, uncomplicated: Secondary | ICD-10-CM | POA: Diagnosis not present

## 2018-07-28 LAB — CBC WITH DIFFERENTIAL/PLATELET
Abs Immature Granulocytes: 0.01 10*3/uL (ref 0.00–0.07)
Basophils Absolute: 0 10*3/uL (ref 0.0–0.1)
Basophils Relative: 1 %
Eosinophils Absolute: 0.6 10*3/uL — ABNORMAL HIGH (ref 0.0–0.5)
Eosinophils Relative: 9 %
HCT: 43.5 % (ref 36.0–46.0)
Hemoglobin: 12.5 g/dL (ref 12.0–15.0)
Immature Granulocytes: 0 %
Lymphocytes Relative: 22 %
Lymphs Abs: 1.4 10*3/uL (ref 0.7–4.0)
MCH: 22.3 pg — ABNORMAL LOW (ref 26.0–34.0)
MCHC: 28.7 g/dL — ABNORMAL LOW (ref 30.0–36.0)
MCV: 77.5 fL — ABNORMAL LOW (ref 80.0–100.0)
Monocytes Absolute: 0.5 10*3/uL (ref 0.1–1.0)
Monocytes Relative: 7 %
Neutro Abs: 4 10*3/uL (ref 1.7–7.7)
Neutrophils Relative %: 61 %
Platelets: 286 10*3/uL (ref 150–400)
RBC: 5.61 MIL/uL — ABNORMAL HIGH (ref 3.87–5.11)
RDW: 14.9 % (ref 11.5–15.5)
WBC: 6.5 10*3/uL (ref 4.0–10.5)
nRBC: 0 % (ref 0.0–0.2)

## 2018-07-28 LAB — URINALYSIS, ROUTINE W REFLEX MICROSCOPIC
Bilirubin Urine: NEGATIVE
Glucose, UA: NEGATIVE mg/dL
Ketones, ur: NEGATIVE mg/dL
Nitrite: POSITIVE — AB
Protein, ur: NEGATIVE mg/dL
Specific Gravity, Urine: 1.01 (ref 1.005–1.030)
pH: 8 (ref 5.0–8.0)

## 2018-07-28 LAB — COMPREHENSIVE METABOLIC PANEL
ALT: 24 U/L (ref 0–44)
AST: 24 U/L (ref 15–41)
Albumin: 3.6 g/dL (ref 3.5–5.0)
Alkaline Phosphatase: 58 U/L (ref 38–126)
Anion gap: 5 (ref 5–15)
BUN: 5 mg/dL — ABNORMAL LOW (ref 8–23)
CO2: 22 mmol/L (ref 22–32)
Calcium: 9.9 mg/dL (ref 8.9–10.3)
Chloride: 111 mmol/L (ref 98–111)
Creatinine, Ser: 0.79 mg/dL (ref 0.44–1.00)
GFR calc Af Amer: >60 mL/min (ref >60)
GFR calc non Af Amer: >60 mL/min (ref >60)
Glucose, Bld: 96 mg/dL (ref 70–99)
Potassium: 4.1 mmol/L (ref 3.5–5.1)
Sodium: 138 mmol/L (ref 135–145)
Total Bilirubin: 0.7 mg/dL (ref 0.3–1.2)
Total Protein: 7.6 g/dL (ref 6.5–8.1)

## 2018-07-28 LAB — URINALYSIS, MICROSCOPIC (REFLEX): WBC, UA: >50 WBC/hpf (ref 0–5)

## 2018-07-28 LAB — WET PREP, GENITAL
Sperm: NONE SEEN
Trich, Wet Prep: NONE SEEN
Yeast Wet Prep HPF POC: NONE SEEN

## 2018-07-28 LAB — I-STAT TROPONIN, ED: Troponin i, poc: 0 ng/mL (ref 0.00–0.08)

## 2018-07-28 MED ORDER — CEFTRIAXONE SODIUM 250 MG IJ SOLR
250.0000 mg | Freq: Once | INTRAMUSCULAR | Status: AC
  Administered 2018-07-28: 250 mg via INTRAMUSCULAR
  Filled 2018-07-28: qty 250

## 2018-07-28 MED ORDER — CEPHALEXIN 500 MG PO CAPS: 500.0000 mg | ORAL_CAPSULE | Freq: Four times a day (QID) | ORAL | 0 refills | Status: DC

## 2018-07-28 MED ORDER — AZITHROMYCIN 250 MG PO TABS
1000.0000 mg | ORAL_TABLET | Freq: Once | ORAL | Status: AC
  Administered 2018-07-28: 1000 mg via ORAL
  Filled 2018-07-28: qty 4

## 2018-07-28 MED ORDER — LIDOCAINE HCL (PF) 1 % IJ SOLN
INTRAMUSCULAR | Status: AC
  Administered 2018-07-28: 0.9 mL
  Filled 2018-07-28: qty 5

## 2018-07-28 NOTE — ED Provider Notes (Signed)
[facility] EMERGENCY DEPARTMENT Provider Note   CSN: [id number] Arrival date & time: 07/28/18  1026    History   Chief Complaint Chief Complaint  Patient presents with  . Gait Problem    HPI Karen GEVING is a 63 y.o. female.     Patient is a 63 year old female with a history of asthma, bipolar disease, schizophrenia, cocaine abuse, hepatitis C who is presenting today with EMS from her doctor's office for complaint of 1 week of difficulty with gait and unusual speech.  Patient states she has had difficulty walking and an intermittent headache however EMS reports that she was able to walk out of the doctor's office and into the stretcher without difficulty and had walked to the doctor's office this morning.  Patient states she is having some abdominal pain over the last few days but denies any nausea or vomiting.  She is also complaining of burning with urination and vaginal burning.  She has had a new sexual partner and is concerned she may have an STI.  Patient has no history of high blood pressure and is never had strokelike symptoms.  She denies any numbness of the extremities or face.  She states both legs feel weak.  She last used cocaine yesterday but denies any chest pain or shortness of breath.  Denies any alcohol use but does smoke cigarettes as well.  The history is provided by the patient and the EMS personnel.    Past Medical History:  Diagnosis Date  . Asthma   . Bipolar 1 disorder ([place])   . Fibromyalgia   . Schizophrenia ([place])   . Tobacco abuse     Patient Active Problem List   Diagnosis Date Noted  . 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 Psychosis ([place]) 09/16/2013  . Schizophrenia [facility])     Past Surgical History:  Procedure Laterality Date  . CESAREAN SECTION    . TUBAL LIGATION       OB History   No obstetric history on file.      Home Medications    Prior to Admission medications   Medication Sig Start Date End  Date Taking? Authorizing Provider  cetirizine (ZYRTEC) 10 MG tablet Take 10 mg by mouth daily.    [provider]  Glecaprevir-Pibrentasvir (MAVYRET) 100-40 MG TABS Take 3 tablets by mouth daily with breakfast. 06/30/18   Kuppelweiser, Cassie L, RPH-CPP  hydrOXYzine (ATARAX/VISTARIL) 50 MG tablet Take 50 mg by mouth at bedtime.    [provider]  lithium carbonate (ESKALITH) 450 MG CR tablet Take 450 mg by mouth 2 (two) times daily.    [provider]  OLANZapine (ZYPREXA) 15 MG tablet Take 30 mg by mouth at bedtime.    [provider]  ranitidine (ZANTAC) 150 MG tablet Take 1 tablet (150 mg total) by mouth 2 (two) times daily. 10/13/17   Street, [place], PA-C  Valbenazine Tosylate (INGREZZA) 80 MG CAPS Take 1 tablet by mouth daily.    [provider]    Family History Family History  Problem Relation Age of Onset  . Liver cancer Neg Hx   . Liver disease Neg Hx     Social History Social History   Tobacco Use  . Smoking status: Current Every Day Smoker    Packs/day: 0.50    Types: Cigarettes  . Smokeless tobacco: Never Used  Substance Use Topics  . Alcohol use: Yes    Comment: occ  . Drug use: Yes  Comment: crack cocaine     Allergies   Penicillins   Review of Systems Review of Systems  All other systems reviewed and are negative.    Physical Exam Updated Vital Signs BP 137/83   Pulse 66   Temp 97.8 F (36.6 C) (Oral)   Resp 16   SpO2 99%   Physical Exam Vitals signs and nursing note reviewed.  Constitutional:      General: She is not in acute distress.    Appearance: She is well-developed.     Comments: Slightly disheveled but clean  HENT:     Head: Normocephalic and atraumatic.     Mouth/Throat:     Mouth: Mucous membranes are moist.     Comments: Edentulous Eyes:     Pupils: Pupils are equal, round, and reactive to light.  Neck:     Musculoskeletal: Normal range of motion and neck supple. No muscular  tenderness.  Cardiovascular:     Rate and Rhythm: Normal rate and regular rhythm.     Heart sounds: Normal heart sounds. No murmur. No friction rub.  Pulmonary:     Effort: Pulmonary effort is normal.     Breath sounds: Normal breath sounds. No wheezing or rales.  Abdominal:     General: Bowel sounds are normal. There is no distension.     Palpations: Abdomen is soft.     Tenderness: There is no abdominal tenderness. There is no guarding or rebound.  Genitourinary:    Pubic Area: No rash.      Vagina: Vaginal discharge present. No tenderness.     Cervix: Discharge present. No erythema or cervical bleeding.     Uterus: Normal.      Adnexa: Right adnexa normal and left adnexa normal.       Right: No mass or tenderness.         Left: No mass or tenderness.    Musculoskeletal: Normal range of motion.        General: No tenderness.     Comments: No edema  Skin:    General: Skin is warm and dry.     Findings: No rash.  Neurological:     Mental Status: She is alert and oriented to person, place, and time.     Cranial Nerves: No cranial nerve deficit.     Comments: Slightly slurred speech but no dysarthria.  Finger-to-nose intact with both arms, no pronator drift noted in all 4 extremities.  5 out of 5 strength in all 4 extremities.  Sensation is intact.  Heel-to-shin intact bilaterally.  Patient is able to ambulate to the bathroom and back with no evidence of ataxia  Psychiatric:        Behavior: Behavior normal.     Comments: Calm and cooperative      ED Treatments / Results  Labs (all labs ordered are listed, but only abnormal results are displayed) Labs Reviewed  CBC WITH DIFFERENTIAL/PLATELET - Abnormal; Notable for the following components:      Result Value   RBC 5.61 (*)    MCV 77.5 (*)    MCH 22.3 (*)    MCHC 28.7 (*)    Eosinophils Absolute 0.6 (*)    All other components within normal limits  COMPREHENSIVE METABOLIC PANEL - Abnormal; Notable for the following  components:   BUN 5 (*)    All other components within normal limits  URINALYSIS, ROUTINE W REFLEX MICROSCOPIC - Abnormal; Notable for the following components:   APPearance TURBID (*)  Hgb urine dipstick TRACE (*)    Nitrite POSITIVE (*)    Leukocytes,Ua LARGE (*)    All other components within normal limits  URINALYSIS, MICROSCOPIC (REFLEX) - Abnormal; Notable for the following components:   Bacteria, UA PRESENT (*)    All other components within normal limits  WET PREP, GENITAL  URINE CULTURE  HIV ANTIBODY (ROUTINE TESTING W REFLEX)  I-STAT TROPONIN, ED  GC/CHLAMYDIA PROBE AMP ([place]) NOT AT [facility]    EKG EKG Interpretation  Date/Time:  Wednesday July 28 2018 10:36:41 EDT Ventricular Rate:  70 PR Interval:    QRS Duration: 92 QT Interval:  438 QTC Calculation: 473 R Axis:   -24 Text Interpretation:  Sinus rhythm Borderline left axis deviation RSR' in V1 or V2, right VCD or RVH No significant change since last tracing Confirmed by Blanchie Dessert [phone]) on 07/28/2018 10:42:27 AM   Radiology Dg Chest 2 View  Result Date: 07/28/2018 CLINICAL DATA:  Weakness, aphasia EXAM: CHEST - 2 VIEW COMPARISON:  08/21/2017 FINDINGS: Heart and mediastinal contours are within normal limits. No focal opacities or effusions. No acute bony abnormality. IMPRESSION: No active cardiopulmonary disease. Electronically Signed   By: Rolm Baptise M.D.   On: 07/28/2018 11:17   Ct Head Wo Contrast  Result Date: 07/28/2018 CLINICAL DATA:  Acute posterior headache EXAM: CT HEAD WITHOUT CONTRAST TECHNIQUE: Contiguous axial images were obtained from the base of the skull through the vertex without intravenous contrast. COMPARISON:  07/28/2016 FINDINGS: Brain: Minor white matter microvascular changes about the ventricles involving both cerebral hemispheres. No acute intracranial hemorrhage, new mass lesion, definite infarction, midline shift herniation, hydrocephalus, or extra-axial fluid collection. No  focal mass effect or edema. Cisterns are patent. No significant cerebellar abnormality. Vascular: No hyperdense vessel or unexpected calcification. Skull: Normal. Negative for fracture or focal lesion. Sinuses/Orbits: No acute finding. Other: None. IMPRESSION: Minor white matter microvascular ischemic changes. No acute intracranial abnormality by noncontrast CT. Electronically Signed   By: Jerilynn Mages.  Shick M.D.   On: 07/28/2018 11:32    Procedures Procedures (including critical care time)  Medications Ordered in ED Medications - No data to display   Initial Impression / Assessment and Plan / ED Course  I have reviewed the triage vital signs and the nursing notes.  Pertinent labs & imaging results that were available during my care of the patient were reviewed by me and considered in my medical decision making (see chart for details).       Patient presenting with multiple vague symptoms and no focal neurologic deficits concerning for stroke at this time.  She has no URI symptoms or fever.  She denies any chest pain or shortness of breath.  She has had intermittent headaches but is not currently having a headache.  Patient is able to ambulate here without having evidence of weakness.  She is complaining of vaginal burning and dysuria in light of the new sexual partner.  Will check for STI, labs to ensure normal electrolytes renal function.  CT of the head to rule out any subacute abnormality.  1:27 PM Patient's head CT is negative for acute abnormality, CBC without acute change, CMP is unchanged, UA with concern for UTI and will treat with antibiotics.  On pelvic exam patient has some mild discharge but no pelvic pain concerning for PID.  Patient is able to dress herself here and is able to walk without any difficulty.  Low suspicion for stroke at this time.  Patient treated with Rocephin and  azithromycin.  Started on keflex for UTI.  Patient does have a penicillin allergy but in the past she has  received Rocephin without complication.  Final Clinical Impressions(s) / ED Diagnoses   Final diagnoses:  Weakness  Acute cystitis without hematuria    ED Discharge Orders         Ordered    cephALEXin (KEFLEX) 500 MG capsule  4 times daily     07/28/18 1427           Blanchie Dessert, MD 07/28/18 1427

## 2018-07-28 NOTE — ED Triage Notes (Signed)
[facility] [address], via [facility], per report pt reports gait and expressive aphasia onset x 1 wk ago, pt ambulatory to EMS truck per EMS, pt c/o generalized abd pain and sore throat and neck pain onset x 2 days, A&Ox4

## 2018-07-28 NOTE — ED Notes (Signed)
Pt reports having an opportunity to have questions answered and verbalizes understanding of discharge instructions

## 2018-07-28 NOTE — ED Notes (Signed)
Got patient undress on the monitor did ekg shown to Dr Plunkett patient is resting with call bell in reach 

## 2018-07-29 LAB — GC/CHLAMYDIA PROBE AMP (~~LOC~~) NOT AT ARMC
Chlamydia: NEGATIVE
Neisseria Gonorrhea: NEGATIVE

## 2018-07-30 LAB — URINE CULTURE: Culture: >=100000 — AB

## 2018-08-01 ENCOUNTER — Telehealth: Payer: Self-pay | Admitting: Emergency Medicine

## 2018-08-01 NOTE — Telephone Encounter (Signed)
Post ED Visit - Positive Culture Follow-up  Culture report reviewed by antimicrobial stewardship pharmacist: [place] Team []  Elenor Quinones, Pharm.D. []  Heide Guile, Pharm.D., BCPS AQ-ID []  Parks Neptune, Pharm.D., BCPS []  Alycia Rossetti, Pharm.D., BCPS [x]  [place], Pharm.D., BCPS, AAHIVP []  Legrand Como, Pharm.D., BCPS, AAHIVP []  Salome Arnt, PharmD, BCPS []  Johnnette Gourd, PharmD, BCPS []  Hughes Better, PharmD, BCPS []  Leeroy Cha, PharmD []  Laqueta Linden, PharmD, BCPS []  Albertina Parr, PharmD  [place] Team []  Leodis Sias, PharmD []  Lindell Spar, PharmD []  Royetta Asal, PharmD []  Graylin Shiver, Rph []  Rema Fendt) Glennon Mac, PharmD []  Arlyn Dunning, PharmD []  Netta Cedars, PharmD []  Dia Sitter, PharmD []  Leone [place], PharmD []  Gretta Arab, PharmD []  Theodis Shove, PharmD []  Peggyann Juba, PharmD []  Reuel Boom, PharmD   Positive urine culture Treated with Cephalexin, organism sensitive to the same and no further patient follow-up is required at this time.  [place] Eberardo Demello 08/01/2018, 12:45 PM

## 2018-09-02 NOTE — Telephone Encounter (Addendum)
I left multiple voicemail messages with Lucious Groves asking [name] (nurse to Ms. Salberg) to call me about assisting with a letter needed for medication approval through [facility] Medicaid.  This is due to Ms. Jahn being denied for Hepatitis C treatment without being in a substance abuse program.  [name] states that she can write a letter attesting to Ms. Olsson's substance abuse treatment.  She recently emailed me back and this is the email thread...   From: [name] Butler-[email or web address]> Sent: Monday, September 27, 2018 12:27 PM To: Boneta Lucks [email or web address]>; Garret Reddish [email or web address]> Cc: [email or web address]> Subject: G. [place]    Good afternoon ladies,  The PDF above is a message from [place] regarding Adryan Druckenmiller.  [facility] has signed an ROI for this provider.  I have been in contact with them and have taken Molli Posey to several appointments.  Ms. Nadara Mustard who also works with [facility] is trying to get Cammy started on her Hep-C medication.  Their office wanted more information regarding Noelie's drug treatment and compliance.  I've included Ms. Sherrine Salberg on this email, as she will be the point of contact for the [place].  Ms. Lyman Speller contact information can be found in the attached PDF or feel free to use the email above.     [name] Butler-Nicholson, Sports coach [address], [place], [place]  [address] [phone] Office       [phone] Fax [email or web address] ________________________________________________________________________________________  Email reply from Garret Reddish states: Is there a sign release of information from [place] where she will allow her illicit drug history and treatment be shared with [facility]?  Garret Reddish, M.Ed., MS.,LCAS, CADC ACT Team Substance  Abuse Specialist [facility] [address], [place] Nadara Mustard [place] Patient [facility] for Infectious Disease Phone: [phone] Fax:  [phone]

## 2018-09-05 ENCOUNTER — Encounter (HOSPITAL_COMMUNITY): Payer: Self-pay | Admitting: Emergency Medicine

## 2018-09-05 ENCOUNTER — Emergency Department (HOSPITAL_COMMUNITY)
Admission: EM | Admit: 2018-09-05 | Discharge: 2018-09-05 | Disposition: A | Discharge status: Home / Self Care | Payer: Medicaid Other | Attending: Emergency Medicine | Admitting: Emergency Medicine

## 2018-09-05 ENCOUNTER — Emergency Department (HOSPITAL_COMMUNITY): Payer: Medicaid Other

## 2018-09-05 ENCOUNTER — Other Ambulatory Visit: Payer: Self-pay

## 2018-09-05 DIAGNOSIS — F17210 Nicotine dependence, cigarettes, uncomplicated: Secondary | ICD-10-CM | POA: Insufficient documentation

## 2018-09-05 DIAGNOSIS — R531 Weakness: Secondary | ICD-10-CM | POA: Diagnosis not present

## 2018-09-05 DIAGNOSIS — J45909 Unspecified asthma, uncomplicated: Secondary | ICD-10-CM | POA: Diagnosis not present

## 2018-09-05 DIAGNOSIS — Z79899 Other long term (current) drug therapy: Secondary | ICD-10-CM | POA: Insufficient documentation

## 2018-09-05 LAB — COMPREHENSIVE METABOLIC PANEL
ALT: 34 U/L (ref 0–44)
AST: 30 U/L (ref 15–41)
Albumin: 3.9 g/dL (ref 3.5–5.0)
Alkaline Phosphatase: 59 U/L (ref 38–126)
Anion gap: 5 (ref 5–15)
BUN: 7 mg/dL — ABNORMAL LOW (ref 8–23)
CO2: 25 mmol/L (ref 22–32)
Calcium: 9.7 mg/dL (ref 8.9–10.3)
Chloride: 108 mmol/L (ref 98–111)
Creatinine, Ser: 0.87 mg/dL (ref 0.44–1.00)
GFR calc Af Amer: >60 mL/min (ref >60)
GFR calc non Af Amer: >60 mL/min (ref >60)
Glucose, Bld: 104 mg/dL — ABNORMAL HIGH (ref 70–99)
Potassium: 3.9 mmol/L (ref 3.5–5.1)
Sodium: 138 mmol/L (ref 135–145)
Total Bilirubin: 0.5 mg/dL (ref 0.3–1.2)
Total Protein: 7.5 g/dL (ref 6.5–8.1)

## 2018-09-05 LAB — CBC WITH DIFFERENTIAL/PLATELET
Abs Immature Granulocytes: 0 10*3/uL (ref 0.00–0.07)
Basophils Absolute: 0 10*3/uL (ref 0.0–0.1)
Basophils Relative: 0 %
Eosinophils Absolute: 0.7 10*3/uL — ABNORMAL HIGH (ref 0.0–0.5)
Eosinophils Relative: 14 %
HCT: 44.6 % (ref 36.0–46.0)
Hemoglobin: 13 g/dL (ref 12.0–15.0)
Immature Granulocytes: 0 %
Lymphocytes Relative: 26 %
Lymphs Abs: 1.3 10*3/uL (ref 0.7–4.0)
MCH: 22.5 pg — ABNORMAL LOW (ref 26.0–34.0)
MCHC: 29.1 g/dL — ABNORMAL LOW (ref 30.0–36.0)
MCV: 77 fL — ABNORMAL LOW (ref 80.0–100.0)
Monocytes Absolute: 0.4 10*3/uL (ref 0.1–1.0)
Monocytes Relative: 7 %
Neutro Abs: 2.6 10*3/uL (ref 1.7–7.7)
Neutrophils Relative %: 53 %
Platelets: 316 10*3/uL (ref 150–400)
RBC: 5.79 MIL/uL — ABNORMAL HIGH (ref 3.87–5.11)
RDW: 15.4 % (ref 11.5–15.5)
WBC: 5 10*3/uL (ref 4.0–10.5)
nRBC: 0 % (ref 0.0–0.2)

## 2018-09-05 LAB — URINALYSIS, ROUTINE W REFLEX MICROSCOPIC
Bilirubin Urine: NEGATIVE
Glucose, UA: NEGATIVE mg/dL
Hgb urine dipstick: NEGATIVE
Ketones, ur: NEGATIVE mg/dL
Leukocytes,Ua: NEGATIVE
Nitrite: NEGATIVE
Protein, ur: NEGATIVE mg/dL
Specific Gravity, Urine: 1.006 (ref 1.005–1.030)
pH: 8 (ref 5.0–8.0)

## 2018-09-05 LAB — ETHANOL: Alcohol, Ethyl (B): <10 mg/dL (ref <10)

## 2018-09-05 LAB — RAPID URINE DRUG SCREEN, HOSP PERFORMED
Amphetamines: NOT DETECTED
Barbiturates: NOT DETECTED
Benzodiazepines: NOT DETECTED
Cocaine: POSITIVE — AB
Opiates: NOT DETECTED
Tetrahydrocannabinol: NOT DETECTED

## 2018-09-05 LAB — TROPONIN I: Troponin I: <0.03 ng/mL (ref <0.03)

## 2018-09-05 NOTE — Discharge Instructions (Addendum)
You have been seen today for weakness. Please read and follow all provided instructions.   1. Medications: usual home medications 2. Treatment: rest, drink plenty of fluids 3. Follow Up: Please call and follow up with neurology. Please follow up with your primary doctor in 2 days for discussion of your diagnoses and further evaluation after today's visit; if you do not have a primary care doctor use the resource guide provided to find one; Please return to the ER for any new or worsening symptoms. Please obtain all of your results from medical records or have your doctors office obtain the results - share them with your doctor - you should be seen at your doctors office. Call today to arrange your follow up.   Take medications as prescribed. Please review all of the medicines and only take them if you do not have an allergy to them. Return to the emergency room for worsening condition or new concerning symptoms. Follow up with your regular doctor. If you don't have a regular doctor use one of the numbers below to establish a primary care doctor.  Please be aware that if you are taking birth control pills, taking other prescriptions, ESPECIALLY ANTIBIOTICS may make the birth control ineffective - if this is the case, either do not engage in sexual activity or use alternative methods of birth control such as condoms until you have finished the medicine and your family doctor says it is OK to restart them. If you are on a blood thinner such as COUMADIN, be aware that any other medicine that you take may cause the coumadin to either work too much, or not enough - you should have your coumadin level rechecked in next 7 days if this is the case.  ?  It is also a possibility that you have an allergic reaction to any of the medicines that you have been prescribed - Everybody reacts differently to medications and while MOST people have no trouble with most medicines, you may have a reaction such as nausea,  vomiting, rash, swelling, shortness of breath. If this is the case, please stop taking the medicine immediately and contact your physician.  ?  You should return to the ER if you develop severe or worsening symptoms.   Emergency Department Resource Guide 1) Find a Doctor and Pay Out of Pocket Although you won't have to find out who is covered by your insurance plan, it is a good idea to ask around and get recommendations. You will then need to call the office and see if the doctor you have chosen will accept you as a new patient and what types of options they offer for patients who are self-pay. Some doctors offer discounts or will set up payment plans for their patients who do not have insurance, but you will need to ask so you aren't surprised when you get to your appointment.  2) Contact Your Local Health Department Not all health departments have doctors that can see patients for sick visits, but many do, so it is worth a call to see if yours does. If you don't know where your local health department is, you can check in your phone book. The CDC also has a tool to help you locate your state's health department, and many state websites also have listings of all of their local health departments.  3) Find a [facility] If your illness is not likely to be very severe or complicated, you may want to try a walk in clinic.  These are popping up all over the country in pharmacies, drugstores, and shopping centers. They're usually staffed by nurse practitioners or physician assistants that have been trained to treat common illnesses and complaints. They're usually fairly quick and inexpensive. However, if you have serious medical issues or chronic medical problems, these are probably not your best option.  No Primary Care Doctor: Call Health Connect at  [phone] - they can help you locate a primary care doctor that  accepts your insurance, provides certain services, etc. Physician Referral Service-  [phone]  Chronic Pain Problems: Organization         Address  Phone   Notes  [facility]  [phone] Patients need to be referred by their primary care doctor.   Medication Assistance: Organization         Address  Phone   Notes  [facility] Medication [facility] [address] [place], [place] [address] [phone] --Must be a resident of [facility] -- Must have NO insurance coverage whatsoever (no Medicaid/ Medicare, etc.) -- The pt. MUST have a primary care doctor that directs their care regularly and follows them in the community   [facility]  [phone]   [employer]  [phone]    Agencies that provide inexpensive medical care: Organization         Address  Phone   Notes  [place]  [phone]   [facility]    [phone]   [facility] [place], [place] [address] [phone]   [place] [address], [place] [phone]   [facility]    [phone]   [facility]    [phone]   [place] [address], [place] Phone:  [phone], Fax:  [phone] Hours of Operation:  9 am - 6 pm, M-F.  Also accepts Medicaid/Medicare and self-pay.  [facility] for [place] [address], [address], [place] Phone: [phone], Fax: [phone]. Hours of Operation:  8:30 am - 5:30 pm, M-F.  Also accepts Medicaid and self-pay.  [facility] [address] [place] Phone: [phone]   [place], [place], [place] [phone], Ext. [phone] Mondays & Thursdays: 7-9 AM.  First 15 patients are seen on a first come, first serve basis.    [place] Providers:  Organization         Address  Phone   Notes  [facility] [address],  [place] [phone] Also accepts self-pay patients.  [facility] [address], [place]  [phone]   [place], [address], [place] [phone]   [facility] [address], [place] [phone]   Lucianne Lei [address], [place]   [phone] Only accepts [place] patients after they have their name applied to their card.   Self-Pay (no insurance) in [facility]:  Organization         Address  Phone   Notes  Sickle Cell Patients, [facility] [place] [phone]   [facility] [place] [phone]   [facility] [place]  [place], [address], [place] [phone]   [facility]/Dr. Osei-Bonsu  [address] [place] or [address], [place] [phone] Phone number for both [place] and [place] locations is the same.  Urgent Medical and [facility] [address] [place] [phone]   [facility] [address] [place] or [address] [phone]   [facility] [address], [place] [phone], phone; [phone], fax Sees patients 1st and 3rd Saturday of every month.  Must not qualify for public or private insurance (i.e. Medicaid, Medicare, [place] Health Choice, Veterans' Benefits)  Household income should be no more than 200% of the poverty level The clinic cannot treat you if you are pregnant or think you are pregnant  Sexually transmitted diseases are not treated at the clinic.

## 2018-09-05 NOTE — ED Triage Notes (Addendum)
Per EMS, patient from [facility], c/o bilateral leg pain x2 weeks. Hx fibromyalgia. Ambulatory with EMS.   Patient begins eating personal food at beside immediately upon arrival.

## 2018-09-05 NOTE — ED Notes (Signed)
ED Provider at bedside. 

## 2018-09-05 NOTE — ED Notes (Signed)
Bed: WA02 Expected date:  Expected time:  Means of arrival:  Comments: EMS/bilat. Leg pain

## 2018-09-05 NOTE — ED Provider Notes (Signed)
[facility] DEPT Provider Note   CSN: [id number] Arrival date & time: 09/05/18  1117    History   Chief Complaint Chief Complaint  Patient presents with  . Leg Pain    HPI Karen Best is a 63 y.o. female with a PMH of Asthma, Bipolar 1 disorder, fibromyalgia, schizophrenia, and Hepatitis C presenting with constant bilateral leg weakness onset 2 weeks ago. [facility] Patient states symptoms are worse with ambulation and better with rest. Patient denies any leg pain or edema. Patient denies injury. Patient denies chest pain, shortness of breath, cough, nausea, vomiting, diarrhea, or abdominal pain. Patient denies dysuria, frequency, or flank pain. Patient reports a fall a few days ago, but denies head injury or LOC. Patient denies headache, numbness, paresthesias, or vision changes. Patient denies sick contacts or recent travel. Patient reports crack cocaine use and states last use was yesterday. Patient reports increased alcohol consumption, but denies alcohol use today. Patient reports tobacco use.      HPI  Past Medical History:  Diagnosis Date  . Asthma   . Bipolar 1 disorder ([place])   . Fibromyalgia   . Schizophrenia ([place])   . Tobacco abuse     Patient Active Problem List   Diagnosis Date Noted  . 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 Psychosis ([place]) 09/16/2013  . Schizophrenia [facility])     Past Surgical History:  Procedure Laterality Date  . CESAREAN SECTION    . TUBAL LIGATION       OB History   No obstetric history on file.      Home Medications    Prior to Admission medications   Medication Sig Start Date End Date Taking? Authorizing Provider  cephALEXin (KEFLEX) 500 MG capsule Take 1 capsule (500 mg total) by mouth 4 (four) times daily. 07/28/18   Blanchie Dessert, MD  cetirizine (ZYRTEC) 10 MG tablet Take 10 mg by mouth daily.    [provider]   Glecaprevir-Pibrentasvir (MAVYRET) 100-40 MG TABS Take 3 tablets by mouth daily with breakfast. 06/30/18   Kuppelweiser, Cassie L, RPH-CPP  hydrOXYzine (ATARAX/VISTARIL) 50 MG tablet Take 50 mg by mouth at bedtime.    [provider]  lithium carbonate (ESKALITH) 450 MG CR tablet Take 450 mg by mouth 2 (two) times daily.    [provider]  OLANZapine (ZYPREXA) 15 MG tablet Take 30 mg by mouth at bedtime.    [provider]  ranitidine (ZANTAC) 150 MG tablet Take 1 tablet (150 mg total) by mouth 2 (two) times daily. 10/13/17   Street, [place], PA-C  Valbenazine Tosylate (INGREZZA) 80 MG CAPS Take 1 tablet by mouth daily.    [provider]    Family History Family History  Problem Relation Age of Onset  . Liver cancer Neg Hx   . Liver disease Neg Hx     Social History Social History   Tobacco Use  . Smoking status: Current Every Day Smoker    Packs/day: 0.50    Types: Cigarettes  . Smokeless tobacco: Never Used  Substance Use Topics  . Alcohol use: Yes    Comment: occ  . Drug use: Yes    Comment: crack cocaine     Allergies   Penicillins   Review of Systems Review of Systems  Constitutional: Negative for activity change, appetite change, chills, diaphoresis, fatigue, fever and unexpected weight change.  HENT: Negative for congestion, rhinorrhea and sore throat.  Eyes: Negative for photophobia and visual disturbance.  Respiratory: Negative for cough, chest tightness and shortness of breath.   Cardiovascular: Negative for chest pain, palpitations and leg swelling.  Gastrointestinal: Negative for abdominal pain, blood in stool, diarrhea, nausea and vomiting.  Endocrine: Negative for cold intolerance and heat intolerance.  Genitourinary: Negative for dysuria.  Musculoskeletal: Negative for myalgias and neck pain.  Skin: Negative for wound.  Allergic/Immunologic: Negative for immunocompromised state.  Neurological: Positive for weakness.  Negative for dizziness, tremors, seizures, syncope, facial asymmetry, speech difficulty, light-headedness, numbness and headaches.  Psychiatric/Behavioral: Negative for confusion, dysphoric mood and sleep disturbance. The patient is not nervous/anxious.    Physical Exam Updated Vital Signs BP 129/70   Pulse (!) 53   Temp 98.1 F (36.7 C) (Oral)   Resp 12   SpO2 97%   Physical Exam Vitals signs and nursing note reviewed.  Constitutional:      General: She is not in acute distress.    Appearance: She is well-developed. She is not diaphoretic.  HENT:     Head: Normocephalic and atraumatic.     Right Ear: Tympanic membrane, ear canal and external ear normal.     Left Ear: Tympanic membrane, ear canal and external ear normal.     Nose: Nose normal. No congestion or rhinorrhea.     Mouth/Throat:     Mouth: Mucous membranes are moist.     Pharynx: No posterior oropharyngeal erythema.  Neck:     Musculoskeletal: Normal range of motion.  Cardiovascular:     Rate and Rhythm: Normal rate and regular rhythm.     Heart sounds: Normal heart sounds. No murmur. No friction rub. No gallop.   Pulmonary:     Effort: Pulmonary effort is normal. No respiratory distress.     Breath sounds: Normal breath sounds. No wheezing or rales.  Abdominal:     Palpations: Abdomen is soft.     Tenderness: There is no abdominal tenderness.  Musculoskeletal: Normal range of motion.        General: No swelling, tenderness, deformity or signs of injury.     Right lower leg: No edema.     Left lower leg: No edema.  Skin:    General: Skin is warm.     Findings: No erythema or rash.  Neurological:     Mental Status: She is alert.    Mental Status:  Alert, oriented, thought content appropriate, able to give a coherent history. Speech fluent without evidence of aphasia. Able to follow 2 step commands without difficulty.  Cranial Nerves:  II:  Peripheral visual fields grossly normal, pupils equal, round,  reactive to light III,IV, VI: ptosis not present, extra-ocular motions intact bilaterally  V,VII: smile symmetric, facial light touch sensation equal VIII: hearing grossly normal to voice  IX,X: symmetric elevation of soft palate, uvula elevates symmetrically  XI: bilateral shoulder shrug symmetric and strong XII: midline tongue extension without fassiculations Motor:  Normal tone. 5/5 in upper and lower extremities bilaterally including strong and equal grip strength and dorsiflexion/plantar flexion Sensory: Pinprick and light touch normal in all extremities.  Deep Tendon Reflexes: 2+ and symmetric in the biceps and patella Cerebellar: normal finger-to-nose with bilateral upper extremities Gait: normal gait and balance. Patient is able to ambulate independently without difficulty. Negative pronator drift. Negative Romberg sign. CV: distal pulses palpable throughout    ED Treatments / Results  Labs (all labs ordered are listed, but only abnormal results are displayed) Labs Reviewed  CBC WITH  DIFFERENTIAL/PLATELET - Abnormal; Notable for the following components:      Result Value   RBC 5.79 (*)    MCV 77.0 (*)    MCH 22.5 (*)    MCHC 29.1 (*)    Eosinophils Absolute 0.7 (*)    All other components within normal limits  COMPREHENSIVE METABOLIC PANEL - Abnormal; Notable for the following components:   Glucose, Bld 104 (*)    BUN 7 (*)    All other components within normal limits  RAPID URINE DRUG SCREEN, HOSP PERFORMED - Abnormal; Notable for the following components:   Cocaine POSITIVE (*)    All other components within normal limits  TROPONIN I  URINALYSIS, ROUTINE W REFLEX MICROSCOPIC  ETHANOL    EKG EKG Interpretation  Date/Time:  Sunday Sep 05 2018 12:14:23 EDT Ventricular Rate:  61 PR Interval:    QRS Duration: 90 QT Interval:  428 QTC Calculation: 432 R Axis:   -35 Text Interpretation:  Sinus rhythm Probable left atrial enlargement Left axis deviation Abnormal  R-wave progression, early transition No significant change since last tracing Confirmed by Dorie Rank [phone]) on 09/05/2018 2:33:16 PM   Radiology Dg Chest Port 1 View  Result Date: 09/05/2018 CLINICAL DATA:  Acute weakness and fall. EXAM: PORTABLE CHEST 1 VIEW COMPARISON:  07/28/2018 prior radiographs FINDINGS: The cardiomediastinal silhouette is unremarkable. There is no evidence of focal airspace disease, pulmonary edema, suspicious pulmonary nodule/mass, pleural effusion, or pneumothorax. No acute bony abnormalities are identified. IMPRESSION: No active disease. Electronically Signed   By: Margarette Canada M.D.   On: 09/05/2018 12:37    Procedures Procedures (including critical care time)  Medications Ordered in ED Medications - No data to display   Initial Impression / Assessment and Plan / ED Course  I have reviewed the triage vital signs and the nursing notes.  Pertinent labs & imaging results that were available during my care of the patient were reviewed by me and considered in my medical decision making (see chart for details).  Clinical Course as of Sep 04 1441  Sun Sep 05, 2018  1255 No active cardiopulmonary disease.   DG Chest Port 1 View [AH]  1331 UDS positive for cocaine.  COCAINE(!): POSITIVE [AH]  1331 WBCs within normal limits.  WBC: 5.0 [AH]  1331 UA is unremarkable.  Urinalysis, Routine w reflex microscopic [AH]    Clinical Course User Index [AH] Arville Lime, PA-C      Patient presents with bilateral leg weakness. Vitals, imaging, and labs reviewed. Patient was seen in the ER for similar symptoms 1 month ago with negative results. Neurological exam is benign. Patient has 5/5 strength in upper and lower extremities. Patient is able to ambulate without difficulty. UA is unremarkable. CBC is within normal limits. CXR is negative. Troponin negative. EKG without acute changes. CXR is negative. Do not suspect a head CT is necessary at this time due to normal  neurological exam and negative head CT a few weeks ago. Patient is stable and in no acute distress. Advised patient to follow up with neurology outpatient. Patient states she understands and agrees with plan.   Findings and plan of care discussed with supervising physician Dr. Tomi Bamberger.  Final Clinical Impressions(s) / ED Diagnoses   Final diagnoses:  Weakness    ED Discharge Orders    None       Arville Lime, [place] 09/05/18 1444    Dorie Rank, MD 09/06/18 1206

## 2018-09-26 ENCOUNTER — Emergency Department (HOSPITAL_COMMUNITY)
Admission: EM | Admit: 2018-09-26 | Discharge: 2018-09-26 | Disposition: A | Discharge status: Home / Self Care | Payer: Medicaid Other | Attending: Emergency Medicine | Admitting: Emergency Medicine

## 2018-09-26 ENCOUNTER — Emergency Department (HOSPITAL_COMMUNITY): Payer: Medicaid Other

## 2018-09-26 ENCOUNTER — Encounter (HOSPITAL_COMMUNITY): Payer: Self-pay | Admitting: Emergency Medicine

## 2018-09-26 ENCOUNTER — Other Ambulatory Visit: Payer: Self-pay

## 2018-09-26 DIAGNOSIS — R4781 Slurred speech: Secondary | ICD-10-CM | POA: Diagnosis not present

## 2018-09-26 DIAGNOSIS — J45909 Unspecified asthma, uncomplicated: Secondary | ICD-10-CM | POA: Diagnosis not present

## 2018-09-26 DIAGNOSIS — F17210 Nicotine dependence, cigarettes, uncomplicated: Secondary | ICD-10-CM | POA: Diagnosis not present

## 2018-09-26 DIAGNOSIS — R531 Weakness: Secondary | ICD-10-CM | POA: Diagnosis present

## 2018-09-26 DIAGNOSIS — R270 Ataxia, unspecified: Secondary | ICD-10-CM | POA: Diagnosis not present

## 2018-09-26 DIAGNOSIS — H7490 Unspecified disorder of middle ear and mastoid, unspecified ear: Secondary | ICD-10-CM | POA: Diagnosis not present

## 2018-09-26 DIAGNOSIS — Z79899 Other long term (current) drug therapy: Secondary | ICD-10-CM | POA: Insufficient documentation

## 2018-09-26 DIAGNOSIS — Z1159 Encounter for screening for other viral diseases: Secondary | ICD-10-CM | POA: Insufficient documentation

## 2018-09-26 LAB — DIFFERENTIAL
Abs Immature Granulocytes: 0.02 10*3/uL (ref 0.00–0.07)
Basophils Absolute: 0 10*3/uL (ref 0.0–0.1)
Basophils Relative: 1 %
Eosinophils Absolute: 0.8 10*3/uL — ABNORMAL HIGH (ref 0.0–0.5)
Eosinophils Relative: 14 %
Immature Granulocytes: 0 %
Lymphocytes Relative: 32 %
Lymphs Abs: 1.8 10*3/uL (ref 0.7–4.0)
Monocytes Absolute: 0.4 10*3/uL (ref 0.1–1.0)
Monocytes Relative: 7 %
Neutro Abs: 2.7 10*3/uL (ref 1.7–7.7)
Neutrophils Relative %: 46 %

## 2018-09-26 LAB — COMPREHENSIVE METABOLIC PANEL
ALT: 36 U/L (ref 0–44)
AST: 35 U/L (ref 15–41)
Albumin: 3.8 g/dL (ref 3.5–5.0)
Alkaline Phosphatase: 69 U/L (ref 38–126)
Anion gap: 5 (ref 5–15)
BUN: 6 mg/dL — ABNORMAL LOW (ref 8–23)
CO2: 25 mmol/L (ref 22–32)
Calcium: 10.3 mg/dL (ref 8.9–10.3)
Chloride: 109 mmol/L (ref 98–111)
Creatinine, Ser: 0.77 mg/dL (ref 0.44–1.00)
GFR calc Af Amer: >60 mL/min (ref >60)
GFR calc non Af Amer: >60 mL/min (ref >60)
Glucose, Bld: 95 mg/dL (ref 70–99)
Potassium: 4.5 mmol/L (ref 3.5–5.1)
Sodium: 139 mmol/L (ref 135–145)
Total Bilirubin: 0.8 mg/dL (ref 0.3–1.2)
Total Protein: 7.8 g/dL (ref 6.5–8.1)

## 2018-09-26 LAB — CBC
HCT: 45.7 % (ref 36.0–46.0)
Hemoglobin: 13.4 g/dL (ref 12.0–15.0)
MCH: 22.3 pg — ABNORMAL LOW (ref 26.0–34.0)
MCHC: 29.3 g/dL — ABNORMAL LOW (ref 30.0–36.0)
MCV: 75.9 fL — ABNORMAL LOW (ref 80.0–100.0)
Platelets: 315 10*3/uL (ref 150–400)
RBC: 6.02 MIL/uL — ABNORMAL HIGH (ref 3.87–5.11)
RDW: 15.7 % — ABNORMAL HIGH (ref 11.5–15.5)
WBC: 5.7 10*3/uL (ref 4.0–10.5)
nRBC: 0 % (ref 0.0–0.2)

## 2018-09-26 LAB — URINALYSIS, ROUTINE W REFLEX MICROSCOPIC
Bilirubin Urine: NEGATIVE
Glucose, UA: NEGATIVE mg/dL
Hgb urine dipstick: NEGATIVE
Ketones, ur: NEGATIVE mg/dL
Nitrite: NEGATIVE
Protein, ur: NEGATIVE mg/dL
Specific Gravity, Urine: 1.005 (ref 1.005–1.030)
pH: 8 (ref 5.0–8.0)

## 2018-09-26 LAB — RAPID URINE DRUG SCREEN, HOSP PERFORMED
Amphetamines: NOT DETECTED
Barbiturates: NOT DETECTED
Benzodiazepines: NOT DETECTED
Cocaine: POSITIVE — AB
Opiates: NOT DETECTED
Tetrahydrocannabinol: NOT DETECTED

## 2018-09-26 LAB — I-STAT CHEM 8, ED
BUN: 7 mg/dL — ABNORMAL LOW (ref 8–23)
Calcium, Ion: 1.31 mmol/L (ref 1.15–1.40)
Chloride: 108 mmol/L (ref 98–111)
Creatinine, Ser: 0.7 mg/dL (ref 0.44–1.00)
Glucose, Bld: 91 mg/dL (ref 70–99)
HCT: 46 % (ref 36.0–46.0)
Hemoglobin: 15.6 g/dL — ABNORMAL HIGH (ref 12.0–15.0)
Potassium: 4.4 mmol/L (ref 3.5–5.1)
Sodium: 139 mmol/L (ref 135–145)
TCO2: 26 mmol/L (ref 22–32)

## 2018-09-26 LAB — SARS CORONAVIRUS 2 BY RT PCR (HOSPITAL ORDER, PERFORMED IN ~~LOC~~ HOSPITAL LAB): SARS Coronavirus 2: NEGATIVE

## 2018-09-26 LAB — APTT: aPTT: 35 seconds (ref 24–36)

## 2018-09-26 LAB — PROTIME-INR
INR: 1 (ref 0.8–1.2)
Prothrombin Time: 13.2 seconds (ref 11.4–15.2)

## 2018-09-26 LAB — ETHANOL: Alcohol, Ethyl (B): <10 mg/dL (ref <10)

## 2018-09-26 NOTE — ED Notes (Signed)
Pt calling home to inform family that she is discharged.

## 2018-09-26 NOTE — ED Notes (Signed)
Patient given discharge instructions and verbalized understanding.  Patient stable to discharge at this time.  Patient is alert and oriented to baseline.  No distressed noted at this time.  All belongings taken with the patient at discharge.   

## 2018-09-26 NOTE — ED Provider Notes (Signed)
[facility] EMERGENCY DEPARTMENT Provider Note   CSN: [id number] Arrival date & time: 09/26/18  1202    History   Chief Complaint Chief Complaint  Patient presents with  . Weakness  . Aphasia    HPI Karen Best is a 63 y.o. female.     HPI 63 year old female who presents to the emergency department at the request of a neighbor who called due to intermittent slurred speech over the past 2 weeks and some generalized weakness which the patient reports seems to be more left leg prominent.  Patient reports feeling more off balance as of lately.  Denies headache.  No change in her vision.  No change in her hearing.  Denies injury or trauma.  No neck pain.  Denies fevers and chills.  No productive cough.  Patient has been eating and drinking normally.  No fevers or chills.  No known COVID-19 exposures or patients under investigation for COVID-19.  Denies focal weakness at this time.  Reports her speech is normal at this time.  Patient denies dysuria or urinary frequency   Past Medical History:  Diagnosis Date  . Asthma   . Bipolar 1 disorder ([place])   . Fibromyalgia   . Schizophrenia ([place])   . Tobacco abuse     Patient Active Problem List   Diagnosis Date Noted  . 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 Psychosis ([place]) 09/16/2013  . Schizophrenia [facility])     Past Surgical History:  Procedure Laterality Date  . CESAREAN SECTION    . TUBAL LIGATION       OB History   No obstetric history on file.      Home Medications    Prior to Admission medications   Medication Sig Start Date End Date Taking? Authorizing Provider  cephALEXin (KEFLEX) 500 MG capsule Take 1 capsule (500 mg total) by mouth 4 (four) times daily. 07/28/18   Blanchie Dessert, MD  cetirizine (ZYRTEC) 10 MG tablet Take 10 mg by mouth daily.    [provider]  Glecaprevir-Pibrentasvir (MAVYRET) 100-40 MG TABS Take 3 tablets by mouth daily with breakfast. 06/30/18    Kuppelweiser, Cassie L, RPH-CPP  hydrOXYzine (ATARAX/VISTARIL) 50 MG tablet Take 50 mg by mouth at bedtime.    [provider]  lithium carbonate (ESKALITH) 450 MG CR tablet Take 450 mg by mouth 2 (two) times daily.    [provider]  OLANZapine (ZYPREXA) 15 MG tablet Take 30 mg by mouth at bedtime.    [provider]  ranitidine (ZANTAC) 150 MG tablet Take 1 tablet (150 mg total) by mouth 2 (two) times daily. 10/13/17   Street, [place], PA-C  Valbenazine Tosylate (INGREZZA) 80 MG CAPS Take 1 tablet by mouth daily.    [provider]    Family History Family History  Problem Relation Age of Onset  . Liver cancer Neg Hx   . Liver disease Neg Hx     Social History Social History   Tobacco Use  . Smoking status: Current Every Day Smoker    Packs/day: 0.50    Types: Cigarettes  . Smokeless tobacco: Never Used  Substance Use Topics  . Alcohol use: Yes    Comment: occ  . Drug use: Yes    Comment: crack cocaine     Allergies   Penicillins   Review of Systems Review of Systems  All other systems reviewed and are negative.    Physical Exam Updated Vital Signs BP (!) 141/79  Pulse (!) 59   Temp 98.4 F (36.9 C) (Oral)   Resp 13   Ht 5\' 5"  (1.651 m)   Wt 59 kg   SpO2 100%   BMI 21.63 kg/m   Physical Exam Vitals signs and nursing note reviewed.  Constitutional:      Appearance: She is well-developed.  HENT:     Head: Normocephalic and atraumatic.  Eyes:     Pupils: Pupils are equal, round, and reactive to light.  Cardiovascular:     Rate and Rhythm: Regular rhythm.  Pulmonary:     Effort: Pulmonary effort is normal.  Abdominal:     Palpations: Abdomen is soft.  Musculoskeletal: Normal range of motion.  Neurological:     Mental Status: She is alert and oriented to person, place, and time.     Comments: 5/5 strength in major muscle groups of  bilateral upper and lower extremities. Speech normal. No facial asymetry.        ED Treatments / Results  Labs (all labs ordered are listed, but only abnormal results are displayed) Labs Reviewed  CBC - Abnormal; Notable for the following components:      Result Value   RBC 6.02 (*)    MCV 75.9 (*)    MCH 22.3 (*)    MCHC 29.3 (*)    RDW 15.7 (*)    All other components within normal limits  DIFFERENTIAL - Abnormal; Notable for the following components:   Eosinophils Absolute 0.8 (*)    All other components within normal limits  COMPREHENSIVE METABOLIC PANEL - Abnormal; Notable for the following components:   BUN 6 (*)    All other components within normal limits  RAPID URINE DRUG SCREEN, HOSP PERFORMED - Abnormal; Notable for the following components:   Cocaine POSITIVE (*)    All other components within normal limits  URINALYSIS, ROUTINE W REFLEX MICROSCOPIC - Abnormal; Notable for the following components:   APPearance HAZY (*)    Leukocytes,Ua SMALL (*)    Bacteria, UA MANY (*)    All other components within normal limits  I-STAT CHEM 8, ED - Abnormal; Notable for the following components:   BUN 7 (*)    Hemoglobin 15.6 (*)    All other components within normal limits  SARS CORONAVIRUS 2 (HOSPITAL ORDER, [facility])  ETHANOL  PROTIME-INR  APTT    EKG EKG Interpretation  Date/Time:  Sunday Sep 26 2018 12:56:42 EDT Ventricular Rate:  59 PR Interval:    QRS Duration: 97 QT Interval:  445 QTC Calculation: 441 R Axis:   -34 Text Interpretation:  Sinus rhythm Left axis deviation Abnormal R-wave progression, early transition No significant change was found Confirmed by Jola Schmidt [phone]) on 09/26/2018 2:43:46 PM   Radiology Mr Brain Wo Contrast  Result Date: 09/26/2018 CLINICAL DATA:  Acute presentation with left leg weakness and slurred speech over the last 2 weeks. EXAM: MRI HEAD WITHOUT CONTRAST TECHNIQUE: Multiplanar, multiecho pulse sequences of the brain and surrounding structures were obtained without  intravenous contrast. COMPARISON:  Head CT 07/28/2018 FINDINGS: Brain: Diffusion imaging does not show any acute or subacute infarction. There chronic small-vessel ischemic changes of the pons. No focal cerebellar finding. Cerebral hemispheres show minimal small vessel change of the deep white matter. No cortical or large vessel territory infarction. No mass lesion, hemorrhage, hydrocephalus or extra-axial collection. Vascular: Major vessels at the base of the brain show flow. Skull and upper cervical spine: Negative Sinuses/Orbits: Paranasal sinuses  appear clear. There is an extensive right mastoid effusion. 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 IMPRESSION: No acute intracranial finding. Minimal small-vessel change noted within the pons and cerebral hemispheric white matter. Extensive right mastoid effusion. Electronically Signed   By: Nelson Chimes M.D.   On: 09/26/2018 14:27    Procedures Procedures (including critical care time)  Medications Ordered in ED Medications - No data to display   Initial Impression / Assessment and Plan / ED Course  I have reviewed the triage vital signs and the nursing notes.  Pertinent labs & imaging results that were available during my care of the patient were reviewed by me and considered in my medical decision making (see chart for details).        No stroke found on MRI.  Work-up otherwise without significant abnormality except for mastoid effusion noted.  Outpatient referral to ENT.  This is likely chronic in nature.  Chronic effusion could lead to some dizziness.  This can be addressed further as an outpatient.  No indication for additional work-up or hospitalization at this time.  Primary care and ENT follow-up.  Patient understands the importance of close follow-up and the instructions to return to the emergency department for new or worsening symptoms.  Final Clinical Impressions(s) / ED Diagnoses   Final diagnoses:   None    ED Discharge Orders    None       Jola Schmidt, MD 09/26/18 1444

## 2018-09-26 NOTE — ED Triage Notes (Signed)
Per EMS pt from home, her neighbor called due to left leg weakness and slurred speech x2 weeks.  Pt AOx4 has slight weakness in left foot flexion.  Pt states she is able to walk it is just hard.  NAD noted at this time denies pain.

## 2018-09-26 NOTE — Discharge Instructions (Signed)
Please call the ear nose and throat surgeon for follow-up regarding the mastoid effusion we discussed  Please follow-up with your primary care physician.

## 2018-09-26 NOTE — Progress Notes (Signed)
CSW contacted regarding a taxi voucher. CSW reviewed patient' chart and noted per patient MI history and family support history she would likely need a taxi voucher to return home safely.   Lamonte Richer, LCSW, [place] Worker II [phone]

## 2018-09-26 NOTE — ED Notes (Signed)
Pt stated she was having central chest 8/10 ECG performed EDP aware. NAD noted at this time.

## 2018-09-26 NOTE — ED Notes (Signed)
Back from MRI denies any pain AOx4

## 2018-09-28 LAB — URINE CULTURE: Culture: >=100000 — AB

## 2018-09-29 ENCOUNTER — Telehealth: Payer: Self-pay | Admitting: *Deleted

## 2018-09-29 ENCOUNTER — Encounter: Payer: Self-pay | Admitting: Neurology

## 2018-09-29 NOTE — Telephone Encounter (Signed)
Post ED Visit - Positive Culture Follow-up  Culture report reviewed by antimicrobial stewardship pharmacist: [place] Team []  Elenor Quinones, Pharm.D. []  Heide Guile, Pharm.D., BCPS AQ-ID []  Parks Neptune, Pharm.D., BCPS []  Alycia Rossetti, Pharm.D., BCPS []  [place], Pharm.D., BCPS, AAHIVP []  Legrand Como, Pharm.D., BCPS, AAHIVP []  Salome Arnt, PharmD, BCPS []  Johnnette Gourd, PharmD, BCPS []  Hughes Better, PharmD, BCPS []  Leeroy Cha, PharmD []  Laqueta Linden, PharmD, BCPS []  Albertina Parr, PharmD Tyson Alias, [place] Team []  Leodis Sias, PharmD []  Lindell Spar, PharmD []  Royetta Asal, PharmD []  Graylin Shiver, Rph []  Rema Fendt) Glennon Mac, PharmD []  Arlyn Dunning, PharmD []  Netta Cedars, PharmD []  Dia Sitter, PharmD []  Leone Haven, PharmD []  Gretta Arab, PharmD []  Theodis Shove, PharmD []  Peggyann Juba, PharmD []  Reuel Boom, PharmD   Positive urine culture Asymptomatic bacteriuria and no further patient follow-up is required at this time.  Harlon Flor [facility] 09/29/2018, 10:52 AM

## 2018-10-04 ENCOUNTER — Ambulatory Visit: Payer: Medicaid Other | Admitting: Neurology

## 2018-10-04 NOTE — Progress Notes (Deleted)
[facility] Clinic Note - Initial Visit   Date: 10/04/18  Karen Best MRN: [id number] DOB: 1955/07/22   Dear Dr Marland KitchenDoren Custard, Melton Krebs, NP:  Thank you for your kind referral of Karen Best for consultation of ***. Although her history is well known to you, please allow [place] to reiterate it for the purpose of our medical record. The patient was accompanied to the clinic by *** who also provides collateral information.     History of Present Illness: Karen Best is a 63 y.o. ***-handed female with schizophrenia, cocaine abuse, hepatitis C, tobacco use, asthma and GERD presenting for evaluation of bilateral leg numbness.   Starting in ***, she began noticing weakness and numbness of the legs which would be triggered by walking.  She has been to the emergency department on 4/1, 5/10, and 5/31 for these complaints as well as speech changes with the latter visit.  MRI brain on 09/26/2018 showed no explanation for her symptoms.  There was note of extensive right mastoid effusion and was recommended to see ENT.   Out-side paper records, electronic medical record, and images have been reviewed where available and summarized as: *** MRI brain 09/26/2018: No acute intracranial finding. Minimal small-vessel change noted within the pons and cerebral hemispheric white matter. Extensive right mastoid effusion.  Labs 05/24/2018: Sodium 138, potassium 1.4, chloride 105, calcium 9.9, creatinine 0.6, BUN 6, glucose 110, AST 31, a LT 32, vitamin D63 875, folic acid 9.0, TSH 1.2, vitamin D 8.9*   Past Medical History:  Diagnosis Date  . Asthma   . Bipolar 1 disorder ([place])   . Fibromyalgia   . Schizophrenia ([place])   . Tobacco abuse     Past Surgical History:  Procedure Laterality Date  . CESAREAN SECTION    . TUBAL LIGATION       Medications:  Outpatient Encounter Medications as of 10/04/2018  Medication Sig Note  . cephALEXin (KEFLEX) 500 MG capsule  Take 1 capsule (500 mg total) by mouth 4 (four) times daily.   . cetirizine (ZYRTEC) 10 MG tablet Take 10 mg by mouth daily.   . Glecaprevir-Pibrentasvir (MAVYRET) 100-40 MG TABS Take 3 tablets by mouth daily with breakfast.   . hydrOXYzine (ATARAX/VISTARIL) 50 MG tablet Take 50 mg by mouth at bedtime.   Marland Kitchen lithium carbonate (ESKALITH) 450 MG CR tablet Take 450 mg by mouth 2 (two) times daily. 10/13/2017: Last picked up 09/29/17 for one week  . OLANZapine (ZYPREXA) 15 MG tablet Take 30 mg by mouth at bedtime.   . ranitidine (ZANTAC) 150 MG tablet Take 1 tablet (150 mg total) by mouth 2 (two) times daily.   [place] Tosylate (INGREZZA) 80 MG CAPS Take 1 tablet by mouth daily.    No facility-administered encounter medications on file as of 10/04/2018.     Allergies:  Allergies  Allergen Reactions  . Penicillins Other (See Comments)    Child hood reaction Has patient had a PCN reaction causing immediate rash, facial/tongue/throat swelling, SOB or lightheadedness with hypotension: Yes Has patient had a PCN reaction causing severe rash involving mucus membranes or skin necrosis: No Has patient had a PCN reaction that required hospitalization No Has patient had a PCN reaction occurring within the last 10 years: No If all of the above answers are "NO", then may proceed with C    Family History: Family History  Problem Relation Age of Onset  . Liver cancer Neg Hx   . Liver disease Neg  Hx     Social History: Social History   Tobacco Use  . Smoking status: Current Every Day Smoker    Packs/day: 0.50    Types: Cigarettes  . Smokeless tobacco: Never Used  Substance Use Topics  . Alcohol use: Yes    Comment: occ  . Drug use: Yes    Comment: crack cocaine   Social History   Social History Narrative  . Not on file    Review of Systems:  CONSTITUTIONAL: No fevers, chills, night sweats, or weight loss.  *** EYES: No visual changes or eye pain ENT: No hearing changes.  No history  of nose bleeds.   RESPIRATORY: No cough, wheezing and shortness of breath.   CARDIOVASCULAR: Negative for chest pain, and palpitations.   GI: Negative for abdominal discomfort, blood in stools or black stools.  No recent change in bowel habits.   GU:  No history of incontinence.   MUSCLOSKELETAL: No history of joint pain or swelling.  No myalgias.   SKIN: Negative for lesions, rash, and itching.   HEMATOLOGY/ONCOLOGY: Negative for prolonged bleeding, bruising easily, and swollen nodes.  No history of cancer.   ENDOCRINE: Negative for cold or heat intolerance, polydipsia or goiter.   PSYCH:  ***depression or anxiety symptoms.   NEURO: As Above.   Vital Signs:  There were no vitals taken for this visit.   General Medical Exam:  *** General:  Well appearing, comfortable.   Eyes/ENT: see cranial nerve examination.   Neck:   No carotid bruits. Respiratory:  Clear to auscultation, good air entry bilaterally.   Cardiac:  Regular rate and rhythm, no murmur.   Extremities:  No deformities, edema, or skin discoloration.  Skin:  No rashes or lesions.  Neurological Exam: MENTAL STATUS including orientation to time, place, person, recent and remote memory, attention span and concentration, language, and fund of knowledge is ***normal.  Speech is not dysarthric.  CRANIAL NERVES: II:  No visual field defects.  Unremarkable fundi.   III-IV-VI: Pupils equal round and reactive to light.  Normal conjugate, extra-ocular eye movements in all directions of gaze.  No nystagmus.  No ptosis***.   V:  Normal facial sensation.    VII:  Normal facial symmetry and movements.   VIII:  Normal hearing and vestibular function.   IX-X:  Normal palatal movement.   XI:  Normal shoulder shrug and head rotation.   XII:  Normal tongue strength and range of motion, no deviation or fasciculation.  MOTOR:  No atrophy, fasciculations or abnormal movements.  No pronator drift.   Upper Extremity:  Right  Left  Deltoid   5/5   5/5   Biceps  5/5   5/5   Triceps  5/5   5/5   Infraspinatus 5/5  5/5  Medial pectoralis 5/5  5/5  Wrist extensors  5/5   5/5   Wrist flexors  5/5   5/5   Finger extensors  5/5   5/5   Finger flexors  5/5   5/5   Dorsal interossei  5/5   5/5   Abductor pollicis  5/5   5/5   Tone (Ashworth scale)  0  0   Lower Extremity:  Right  Left  Hip flexors  5/5   5/5   Hip extensors  5/5   5/5   Adductor 5/5  5/5  Abductor 5/5  5/5  Knee flexors  5/5   5/5   Knee extensors  5/5   5/5  Dorsiflexors  5/5   5/5   Plantarflexors  5/5   5/5   Toe extensors  5/5   5/5   Toe flexors  5/5   5/5   Tone (Ashworth scale)  0  0   MSRs:  Right        Left                  brachioradialis 2+  2+  biceps 2+  2+  triceps 2+  2+  patellar 2+  2+  ankle jerk 2+  2+  Hoffman no  no  plantar response down  down   SENSORY:  Normal and symmetric perception of light touch, pinprick, vibration, and proprioception.  Romberg's sign absent.   COORDINATION/GAIT: Normal finger-to- nose-finger and heel-to-shin.  Intact rapid alternating movements bilaterally.  Able to rise from a chair without using arms.  Gait narrow based and stable. Tandem and stressed gait intact.    IMPRESSION: ***  PLAN/RECOMMENDATIONS:  *** Return to clinic in *** months.    Thank you for allowing me to participate in patient's care.  If I can answer any additional questions, I would be pleased to do so.    Sincerely,    Donika K. Posey Pronto, DO

## 2018-10-26 ENCOUNTER — Other Ambulatory Visit: Payer: Self-pay

## 2018-10-26 ENCOUNTER — Encounter (HOSPITAL_COMMUNITY): Payer: Self-pay

## 2018-10-26 ENCOUNTER — Ambulatory Visit (HOSPITAL_COMMUNITY)
Admission: EM | Admit: 2018-10-26 | Discharge: 2018-10-26 | Disposition: A | Discharge status: Home / Self Care | Payer: Medicaid Other | Attending: Urgent Care | Admitting: Urgent Care

## 2018-10-26 DIAGNOSIS — F1490 Cocaine use, unspecified, uncomplicated: Secondary | ICD-10-CM | POA: Diagnosis present

## 2018-10-26 DIAGNOSIS — R29898 Other symptoms and signs involving the musculoskeletal system: Secondary | ICD-10-CM | POA: Diagnosis present

## 2018-10-26 DIAGNOSIS — N3000 Acute cystitis without hematuria: Secondary | ICD-10-CM | POA: Insufficient documentation

## 2018-10-26 LAB — POCT URINALYSIS DIP (DEVICE)
Bilirubin Urine: NEGATIVE
Glucose, UA: NEGATIVE mg/dL
Hgb urine dipstick: NEGATIVE
Ketones, ur: NEGATIVE mg/dL
Nitrite: POSITIVE — AB
Protein, ur: NEGATIVE mg/dL
Specific Gravity, Urine: 1.015 (ref 1.005–1.030)
Urobilinogen, UA: 0.2 mg/dL (ref 0.0–1.0)
pH: 7 (ref 5.0–8.0)

## 2018-10-26 MED ORDER — SULFAMETHOXAZOLE-TRIMETHOPRIM 800-160 MG PO TABS: 1.0000 | ORAL_TABLET | Freq: Two times a day (BID) | ORAL | 0 refills | Status: DC

## 2018-10-26 NOTE — ED Provider Notes (Signed)
MRN: [id number] DOB: [birth year]  Subjective:   Karen Best is a 63 y.o. female presenting for several week history of daily mild to moderate persistent dysuria, urinary frequency, urinary urgency.  Patient reports that she tries to hydrate well.  She also reports that she feels weak on her legs.  Feels like sometimes they give out on her.  She is a drug user, last use of crack cocaine was 1 day ago.  She is requesting pain medication today.  She does have a PCP but has not been back to see her.  She reports that she does not know what medicine she is taking exactly or for what diagnosis.  Denies chest pain, abdominal pain, hematuria, confusion, dizziness.  She does feel fatigued.  No current facility-administered medications for this encounter.   Current Outpatient Medications:  .  cephALEXin (KEFLEX) 500 MG capsule, Take 1 capsule (500 mg total) by mouth 4 (four) times daily., Disp: 20 capsule, Rfl: 0 .  cetirizine (ZYRTEC) 10 MG tablet, Take 10 mg by mouth daily., Disp: , Rfl:  .  Glecaprevir-Pibrentasvir (MAVYRET) 100-40 MG TABS, Take 3 tablets by mouth daily with breakfast., Disp: 84 tablet, Rfl: 1 .  hydrOXYzine (ATARAX/VISTARIL) 50 MG tablet, Take 50 mg by mouth at bedtime., Disp: , Rfl:  .  lithium carbonate (ESKALITH) 450 MG CR tablet, Take 450 mg by mouth 2 (two) times daily., Disp: , Rfl:  .  OLANZapine (ZYPREXA) 15 MG tablet, Take 30 mg by mouth at bedtime., Disp: , Rfl:  .  ranitidine (ZANTAC) 150 MG tablet, Take 1 tablet (150 mg total) by mouth 2 (two) times daily., Disp: 60 tablet, Rfl: 0 .  Valbenazine Tosylate (INGREZZA) 80 MG CAPS, Take 1 tablet by mouth daily., Disp: , Rfl:    Allergies  Allergen Reactions  . Penicillins Other (See Comments)    Child hood reaction Has patient had a PCN reaction causing immediate rash, facial/tongue/throat swelling, SOB or lightheadedness with hypotension: Yes Has patient had a PCN reaction causing severe rash involving mucus membranes  or skin necrosis: No Has patient had a PCN reaction that required hospitalization No Has patient had a PCN reaction occurring within the last 10 years: No If all of the above answers are "NO", then may proceed with C    Past Medical History:  Diagnosis Date  . Asthma   . Bipolar 1 disorder ([place])   . Fibromyalgia   . Schizophrenia ([place])   . Tobacco abuse      Past Surgical History:  Procedure Laterality Date  . CESAREAN SECTION    . TUBAL LIGATION      ROS  Objective:   Vitals: BP 111/75 (BP Location: Left Arm)   Pulse 65   Temp (!) 97.2 F (36.2 C) (Oral)   Resp 18   SpO2 100%   Physical Exam Constitutional:      General: She is not in acute distress.    Appearance: Normal appearance. She is well-developed and normal weight. She is not ill-appearing, toxic-appearing or diaphoretic.  HENT:     Head: Normocephalic and atraumatic.     Right Ear: External ear normal.     Left Ear: External ear normal.     Nose: Nose normal.     Mouth/Throat:     Mouth: Mucous membranes are moist.     Pharynx: Oropharynx is clear.  Eyes:     General: No scleral icterus.    Extraocular Movements: Extraocular movements intact.  Pupils: Pupils are equal, round, and reactive to light.  Cardiovascular:     Rate and Rhythm: Normal rate and regular rhythm.     Pulses: Normal pulses.     Heart sounds: Normal heart sounds. No murmur. No friction rub. No gallop.   Pulmonary:     Effort: Pulmonary effort is normal. No respiratory distress.     Breath sounds: Normal breath sounds. No stridor. No wheezing, rhonchi or rales.  Abdominal:     General: Bowel sounds are normal. There is no distension.     Palpations: Abdomen is soft. There is no mass.     Tenderness: There is no abdominal tenderness. There is no right CVA tenderness, left CVA tenderness, guarding or rebound.  Skin:    General: Skin is warm and dry.     Coloration: Skin is not pale.     Findings: No rash.  Neurological:      General: No focal deficit present.     Mental Status: She is alert and oriented to person, place, and time.     Motor: No weakness.     Deep Tendon Reflexes: Reflexes normal.  Psychiatric:        Mood and Affect: Mood normal.        Behavior: Behavior normal.        Thought Content: Thought content normal.        Judgment: Judgment normal.     Results for orders placed or performed during the hospital encounter of 10/26/18 (from the past 24 hour(s))  POCT urinalysis dip (device)     Status: Abnormal   Collection Time: 10/26/18  1:55 PM  Result Value Ref Range   Glucose, UA NEGATIVE NEGATIVE mg/dL   Bilirubin Urine NEGATIVE NEGATIVE   Ketones, ur NEGATIVE NEGATIVE mg/dL   Specific Gravity, Urine 1.015 1.005 - 1.030   Hgb urine dipstick NEGATIVE NEGATIVE   pH 7.0 5.0 - 8.0   Protein, ur NEGATIVE NEGATIVE mg/dL   Urobilinogen, UA 0.2 0.0 - 1.0 mg/dL   Nitrite POSITIVE (A) NEGATIVE   Leukocytes,Ua LARGE (A) NEGATIVE    Assessment and Plan :   1. Acute cystitis without hematuria   2. Crack cocaine use   3. Weakness of both legs    Patient has cystitis, will use Bactrim for this.  Counseled that her fatigue and weakness may be related to chronic drug use.  Recommended she follow-up with her PCP for continued management of this as well as her chronic conditions. Counseled patient on potential for adverse effects with medications prescribed/recommended today, ER and return-to-clinic precautions discussed, patient verbalized understanding.     Jaynee Eagles, [place] 10/26/18 1413

## 2018-10-26 NOTE — ED Triage Notes (Signed)
Patient presents to Urgent Care with complaints of burning with urination and "legs giving out on me" since a month ago.

## 2018-10-28 ENCOUNTER — Telehealth (HOSPITAL_COMMUNITY): Payer: Self-pay | Admitting: Emergency Medicine

## 2018-10-28 LAB — URINE CULTURE: Culture: >=100000 — AB

## 2018-10-28 MED ORDER — CEPHALEXIN 500 MG PO CAPS: 500.0000 mg | ORAL_CAPSULE | Freq: Two times a day (BID) | ORAL | 0 refills | Status: AC

## 2018-10-28 NOTE — Telephone Encounter (Signed)
Urine culture was positive for ecoli  resistant to trimeth given at urgent care visit. Prescription for keflex per sent to pharmacy of choice. Pt called and made aware. Pt educated to follow up if symptoms are not improving. Verbalized understanding.

## 2018-11-09 ENCOUNTER — Encounter (HOSPITAL_COMMUNITY): Payer: Self-pay | Admitting: Emergency Medicine

## 2018-11-09 ENCOUNTER — Other Ambulatory Visit: Payer: Self-pay

## 2018-11-09 ENCOUNTER — Emergency Department (HOSPITAL_COMMUNITY)
Admission: EM | Admit: 2018-11-09 | Discharge: 2018-11-09 | Disposition: A | Discharge status: Home / Self Care | Payer: Medicaid Other | Attending: Emergency Medicine | Admitting: Emergency Medicine

## 2018-11-09 ENCOUNTER — Emergency Department (HOSPITAL_COMMUNITY): Payer: Medicaid Other

## 2018-11-09 DIAGNOSIS — J45909 Unspecified asthma, uncomplicated: Secondary | ICD-10-CM | POA: Insufficient documentation

## 2018-11-09 DIAGNOSIS — R531 Weakness: Secondary | ICD-10-CM | POA: Diagnosis present

## 2018-11-09 DIAGNOSIS — Z79899 Other long term (current) drug therapy: Secondary | ICD-10-CM | POA: Diagnosis not present

## 2018-11-09 DIAGNOSIS — N3000 Acute cystitis without hematuria: Secondary | ICD-10-CM | POA: Diagnosis not present

## 2018-11-09 DIAGNOSIS — F17210 Nicotine dependence, cigarettes, uncomplicated: Secondary | ICD-10-CM | POA: Insufficient documentation

## 2018-11-09 LAB — URINALYSIS, ROUTINE W REFLEX MICROSCOPIC
Bilirubin Urine: NEGATIVE
Glucose, UA: NEGATIVE mg/dL
Hgb urine dipstick: NEGATIVE
Ketones, ur: NEGATIVE mg/dL
Nitrite: NEGATIVE
Protein, ur: NEGATIVE mg/dL
Specific Gravity, Urine: 1.009 (ref 1.005–1.030)
pH: 8 (ref 5.0–8.0)

## 2018-11-09 LAB — CBC WITH DIFFERENTIAL/PLATELET
Abs Immature Granulocytes: 0.01 10*3/uL (ref 0.00–0.07)
Basophils Absolute: 0 10*3/uL (ref 0.0–0.1)
Basophils Relative: 0 %
Eosinophils Absolute: 0.8 10*3/uL — ABNORMAL HIGH (ref 0.0–0.5)
Eosinophils Relative: 13 %
HCT: 42.3 % (ref 36.0–46.0)
Hemoglobin: 12.4 g/dL (ref 12.0–15.0)
Immature Granulocytes: 0 %
Lymphocytes Relative: 35 %
Lymphs Abs: 2.1 10*3/uL (ref 0.7–4.0)
MCH: 22.6 pg — ABNORMAL LOW (ref 26.0–34.0)
MCHC: 29.3 g/dL — ABNORMAL LOW (ref 30.0–36.0)
MCV: 77 fL — ABNORMAL LOW (ref 80.0–100.0)
Monocytes Absolute: 0.5 10*3/uL (ref 0.1–1.0)
Monocytes Relative: 8 %
Neutro Abs: 2.7 10*3/uL (ref 1.7–7.7)
Neutrophils Relative %: 44 %
Platelets: 341 10*3/uL (ref 150–400)
RBC: 5.49 MIL/uL — ABNORMAL HIGH (ref 3.87–5.11)
RDW: 16.5 % — ABNORMAL HIGH (ref 11.5–15.5)
WBC: 6.2 10*3/uL (ref 4.0–10.5)
nRBC: 0 % (ref 0.0–0.2)

## 2018-11-09 LAB — RAPID URINE DRUG SCREEN, HOSP PERFORMED
Amphetamines: NOT DETECTED
Barbiturates: NOT DETECTED
Benzodiazepines: NOT DETECTED
Cocaine: POSITIVE — AB
Opiates: NOT DETECTED
Tetrahydrocannabinol: NOT DETECTED

## 2018-11-09 LAB — COMPREHENSIVE METABOLIC PANEL
ALT: 34 U/L (ref 0–44)
AST: 29 U/L (ref 15–41)
Albumin: 3.6 g/dL (ref 3.5–5.0)
Alkaline Phosphatase: 63 U/L (ref 38–126)
Anion gap: 7 (ref 5–15)
BUN: 6 mg/dL — ABNORMAL LOW (ref 8–23)
CO2: 24 mmol/L (ref 22–32)
Calcium: 9.9 mg/dL (ref 8.9–10.3)
Chloride: 107 mmol/L (ref 98–111)
Creatinine, Ser: 0.87 mg/dL (ref 0.44–1.00)
GFR calc Af Amer: >60 mL/min (ref >60)
GFR calc non Af Amer: >60 mL/min (ref >60)
Glucose, Bld: 91 mg/dL (ref 70–99)
Potassium: 4.3 mmol/L (ref 3.5–5.1)
Sodium: 138 mmol/L (ref 135–145)
Total Bilirubin: 0.3 mg/dL (ref 0.3–1.2)
Total Protein: 7.4 g/dL (ref 6.5–8.1)

## 2018-11-09 LAB — ETHANOL: Alcohol, Ethyl (B): <10 mg/dL (ref <10)

## 2018-11-09 LAB — TROPONIN I (HIGH SENSITIVITY)
Troponin I (High Sensitivity): 2 ng/L (ref <18)
Troponin I (High Sensitivity): 6 ng/L (ref <18)

## 2018-11-09 MED ORDER — CIPROFLOXACIN HCL 500 MG PO TABS: 500.0000 mg | ORAL_TABLET | Freq: Two times a day (BID) | ORAL | 0 refills | Status: DC

## 2018-11-09 MED ORDER — CIPROFLOXACIN HCL 500 MG PO TABS
500.0000 mg | ORAL_TABLET | Freq: Once | ORAL | Status: AC
  Administered 2018-11-09: 500 mg via ORAL
  Filled 2018-11-09: qty 1

## 2018-11-09 NOTE — ED Notes (Signed)
Patient verbalizes understanding of discharge instructions. Opportunity for questioning and answers were provided. Armband removed by staff, pt discharged from ED.  

## 2018-11-09 NOTE — ED Triage Notes (Signed)
[facility] [place] [facility] with complaints of left leg pain and slurred speech for the last month. Pt states that sometimes her speech is bad and sometimes it gets better. Pt states that there was no difference in her symtoms today she was just tired of them and wanted to come in.

## 2018-11-09 NOTE — ED Provider Notes (Signed)
[facility]PART[facility] Provider Note   CSN: [id number] Arrival date & time: 11/09/18  1539     History   Chief Complaint Chief Complaint  Patient presents with  . Leg Pain  . Aphasia    HPI Karen Best is a 63 y.o. female.     Patient is a 63 year old female with past medical history of schizophrenia, fibromyalgia, bipolar, and crack cocaine abuse.  She presents today for evaluation of weakness.  She states she is having difficulty putting on her clothes.  She also complains of pain in her left leg in the absence of any injury or trauma.  Patient's history is somewhat limited secondary to her being a poor historian.  Patient does admit to a history of crack cocaine abuse but states that she has not used in the past week.  The history is provided by the patient.    Past Medical History:  Diagnosis Date  . Asthma   . Bipolar 1 disorder ([place])   . Fibromyalgia   . Schizophrenia ([name])   . Tobacco abuse     Patient Active Problem List   Diagnosis Date Noted  . 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 Psychosis ([name]) 09/16/2013  . Schizophrenia [facility])     Past Surgical History:  Procedure Laterality Date  . CESAREAN SECTION    . TUBAL LIGATION       OB History   No obstetric history on file.      Home Medications    Prior to Admission medications   Medication Sig Start Date End Date Taking? Authorizing Provider  Glecaprevir-Pibrentasvir (MAVYRET) 100-40 MG TABS Take 3 tablets by mouth daily with breakfast. 06/30/18   Kuppelweiser, Cassie L, RPH-CPP  hydrOXYzine (ATARAX/VISTARIL) 50 MG tablet Take 50 mg by mouth at bedtime.    [provider]  lithium carbonate (ESKALITH) 450 MG CR tablet Take 450 mg by mouth 2 (two) times daily.    [provider]  OLANZapine (ZYPREXA) 15 MG tablet Take 30 mg by mouth at bedtime.    [provider]  ranitidine (ZANTAC) 150 MG tablet Take 1 tablet (150 mg total) by  mouth 2 (two) times daily. 10/13/17   Street, [place], PA-C  Valbenazine Tosylate (INGREZZA) 80 MG CAPS Take 1 tablet by mouth daily.    [provider]  cetirizine (ZYRTEC) 10 MG tablet Take 10 mg by mouth daily.  10/26/18  [provider]    Family History Family History  Problem Relation Age of Onset  . Liver cancer Neg Hx   . Liver disease Neg Hx     Social History Social History   Tobacco Use  . Smoking status: Current Every Day Smoker    Packs/day: 0.50    Types: Cigarettes  . Smokeless tobacco: Never Used  Substance Use Topics  . Alcohol use: Yes    Comment: occ  . Drug use: Yes    Types: Cocaine    Comment: crack cocaine (last use 10/25/2018)     Allergies   Penicillins   Review of Systems Review of Systems  All other systems reviewed and are negative.    Physical Exam Updated Vital Signs BP (!) 151/83 (BP Location: Right Arm)   Pulse (!) 54   Temp 98.5 F (36.9 C) (Oral)   Resp 13   SpO2 100%   Physical Exam Vitals signs and nursing note reviewed.  Constitutional:      General: She is not in acute  distress.    Appearance: She is well-developed. She is not diaphoretic.  HENT:     Head: Normocephalic and atraumatic.     Mouth/Throat:     Mouth: Mucous membranes are moist.     Pharynx: No oropharyngeal exudate or posterior oropharyngeal erythema.  Neck:     Musculoskeletal: Normal range of motion and neck supple.  Cardiovascular:     Rate and Rhythm: Normal rate and regular rhythm.     Heart sounds: No murmur. No friction rub. No gallop.   Pulmonary:     Effort: Pulmonary effort is normal. No respiratory distress.     Breath sounds: Normal breath sounds. No wheezing.  Abdominal:     General: Bowel sounds are normal. There is no distension.     Palpations: Abdomen is soft.     Tenderness: There is no abdominal tenderness.  Musculoskeletal: Normal range of motion.        General: No swelling or tenderness.     Right lower  leg: No edema.     Left lower leg: No edema.  Skin:    General: Skin is warm and dry.  Neurological:     General: No focal deficit present.     Mental Status: She is alert and oriented to person, place, and time.     Cranial Nerves: No cranial nerve deficit.     Motor: No weakness.     Coordination: Coordination normal.      ED Treatments / Results  Labs (all labs ordered are listed, but only abnormal results are displayed) Labs Reviewed  COMPREHENSIVE METABOLIC PANEL  CBC WITH DIFFERENTIAL/PLATELET  ETHANOL  URINALYSIS, ROUTINE W REFLEX MICROSCOPIC  RAPID URINE DRUG SCREEN, HOSP PERFORMED  TROPONIN I (HIGH SENSITIVITY)    EKG None  Radiology No results found.  Procedures Procedures (including critical care time)  Medications Ordered in ED Medications - No data to display   Initial Impression / Assessment and Plan / ED Course  I have reviewed the triage vital signs and the nursing notes.  Pertinent labs & imaging results that were available during my care of the patient were reviewed by me and considered in my medical decision making (see chart for details).  Patient presenting here with complaints of weakness and generally not feeling well.  She was recently treated for a urinary tract infection which does not appear to have cleared.  Laboratory studies here are unremarkable, but urinalysis shows a persistent infection.  Patient will be discharged with Cipro and return as needed.  Final Clinical Impressions(s) / ED Diagnoses   Final diagnoses:  None    ED Discharge Orders    None       Veryl Speak, MD 11/09/18 1904

## 2018-11-09 NOTE — Discharge Instructions (Addendum)
Begin taking Cipro as prescribed.  Return to the emergency department if your symptoms significantly worsen or change.

## 2018-11-13 ENCOUNTER — Encounter (HOSPITAL_COMMUNITY): Payer: Self-pay

## 2018-11-13 ENCOUNTER — Other Ambulatory Visit: Payer: Self-pay

## 2018-11-13 ENCOUNTER — Emergency Department (HOSPITAL_COMMUNITY)
Admission: EM | Admit: 2018-11-13 | Discharge: 2018-11-13 | Disposition: A | Discharge status: Home / Self Care | Payer: Medicaid Other | Attending: Emergency Medicine | Admitting: Emergency Medicine

## 2018-11-13 DIAGNOSIS — R42 Dizziness and giddiness: Secondary | ICD-10-CM | POA: Diagnosis not present

## 2018-11-13 DIAGNOSIS — J45909 Unspecified asthma, uncomplicated: Secondary | ICD-10-CM | POA: Diagnosis not present

## 2018-11-13 DIAGNOSIS — Z0189 Encounter for other specified special examinations: Secondary | ICD-10-CM | POA: Diagnosis present

## 2018-11-13 DIAGNOSIS — F17210 Nicotine dependence, cigarettes, uncomplicated: Secondary | ICD-10-CM | POA: Diagnosis not present

## 2018-11-13 NOTE — ED Provider Notes (Signed)
[facility] DEPT Provider Note   CSN: [id number] Arrival date & time: 11/13/18  1736     History   Chief Complaint Chief Complaint  Patient presents with  . Medical Clearance    HPI Karen Best is a 63 y.o. female.     HPI 63 year old female with a history of bipolar disorder and schizophrenia who presents the emergency department because she states when she stands up she feels the need to start running.  She reports that she lives home alone in an apartment.  An ambulance had came to her apartment complex for someone else and she went to the ambulance and told him about her symptoms.  She denies weakness of her arms or legs.  No difficulty with her speech.  Denies alcohol use today.  Blood sugar for EMS was 117.  Vital signs were normal except for slight hypertension for EMS.  Please see nursing note for complete details.  Patient denies chest pain or shortness of breath.  No abdominal pain.  No other complaints at this time.  No recent sick contacts or patients under investigation for COVID-19   Past Medical History:  Diagnosis Date  . Asthma   . Bipolar 1 disorder ([place])   . Fibromyalgia   . Schizophrenia ([place])   . Tobacco abuse     Patient Active Problem List   Diagnosis Date Noted  . 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 Psychosis ([place]) 09/16/2013  . Schizophrenia [facility])     Past Surgical History:  Procedure Laterality Date  . CESAREAN SECTION    . TUBAL LIGATION       OB History   No obstetric history on file.      Home Medications    Prior to Admission medications   Medication Sig Start Date End Date Taking? Authorizing Provider  ciprofloxacin (CIPRO) 500 MG tablet Take 1 tablet (500 mg total) by mouth 2 (two) times daily. One po bid x 7 days 11/09/18   Veryl Speak, MD  Glecaprevir-Pibrentasvir (MAVYRET) 100-40 MG TABS Take 3 tablets by mouth daily with breakfast. 06/30/18   Kuppelweiser, Cassie L, RPH-CPP   hydrOXYzine (ATARAX/VISTARIL) 50 MG tablet Take 50 mg by mouth at bedtime.    [provider]  lithium carbonate (ESKALITH) 450 MG CR tablet Take 450 mg by mouth 2 (two) times daily.    [provider]  OLANZapine (ZYPREXA) 15 MG tablet Take 30 mg by mouth at bedtime.    [provider]  ranitidine (ZANTAC) 150 MG tablet Take 1 tablet (150 mg total) by mouth 2 (two) times daily. 10/13/17   Street, [place], PA-C  Valbenazine Tosylate (INGREZZA) 80 MG CAPS Take 1 tablet by mouth daily.    [provider]  cetirizine (ZYRTEC) 10 MG tablet Take 10 mg by mouth daily.  10/26/18  [provider]    Family History Family History  Problem Relation Age of Onset  . Liver cancer Neg Hx   . Liver disease Neg Hx     Social History Social History   Tobacco Use  . Smoking status: Current Every Day Smoker    Packs/day: 0.50    Types: Cigarettes  . Smokeless tobacco: Never Used  Substance Use Topics  . Alcohol use: Yes    Comment: occ  . Drug use: Yes    Types: Cocaine    Comment: crack cocaine (last use 10/25/2018)     Allergies   Penicillins   Review of Systems  Review of Systems  All other systems reviewed and are negative.    Physical Exam Updated Vital Signs BP 131/73 (BP Location: Right Arm)   Pulse 70   Resp 18   SpO2 100%   Physical Exam Vitals signs and nursing note reviewed.  Constitutional:      General: She is not in acute distress.    Appearance: She is well-developed.  HENT:     Head: Normocephalic and atraumatic.  Eyes:     Pupils: Pupils are equal, round, and reactive to light.  Neck:     Musculoskeletal: Normal range of motion.  Cardiovascular:     Rate and Rhythm: Normal rate and regular rhythm.     Heart sounds: Normal heart sounds.  Pulmonary:     Effort: Pulmonary effort is normal.     Breath sounds: Normal breath sounds.  Abdominal:     General: There is no distension.     Palpations: Abdomen is soft.      Tenderness: There is no abdominal tenderness.  Musculoskeletal: Normal range of motion.  Skin:    General: Skin is warm and dry.  Neurological:     Mental Status: She is alert and oriented to person, place, and time.     Comments: 5/5 strength in major muscle groups of  bilateral upper and lower extremities. Speech normal. No facial asymetry.  Gait normal.  Finger-to-nose normal bilaterally  Psychiatric:        Judgment: Judgment normal.      ED Treatments / Results  Labs (all labs ordered are listed, but only abnormal results are displayed) Labs Reviewed - No data to display  EKG None  Radiology No results found.  Procedures Procedures (including critical care time)  Medications Ordered in ED Medications - No data to display   Initial Impression / Assessment and Plan / ED Course  I have reviewed the triage vital signs and the nursing notes.  Pertinent labs & imaging results that were available during my care of the patient were reviewed by me and considered in my medical decision making (see chart for details).        Patient is overall well-appearing.  She has normal neurologic exam at this time.  She is asking for food.  I do not think she needs a significant work-up at this time.  She has no significant complaints.  Her complaint is somewhat bizarre.  She however does not appear to be a threat to herself or others at this time.  I do not think she needs psychiatric evaluation.  I saw her in May of this year for reported possible ataxia by a neighbor.  At that time she underwent laboratory studies and an MRI of her brain which demonstrated no acute abnormality and no evidence of acute stroke.  I once again have recommended that the patient may benefit from a cane or walker for stability purposes however she was able to walk independently here in the emergency department without much difficulty.  Final Clinical Impressions(s) / ED Diagnoses   Final diagnoses:   Dizziness    ED Discharge Orders    None       Jola Schmidt, MD 11/13/18 1836

## 2018-11-13 NOTE — ED Triage Notes (Signed)
Ems reports that pt states "she feels like she needs to run when standing up". Ambulatory no other complaints.   Bp 150/84 Hr:98 o2:98 cbg:117 Temp:98.1

## 2018-11-13 NOTE — ED Notes (Signed)
ED Provider at bedside. 

## 2018-12-05 ENCOUNTER — Emergency Department (HOSPITAL_COMMUNITY): Payer: Medicaid Other

## 2018-12-05 ENCOUNTER — Other Ambulatory Visit: Payer: Self-pay

## 2018-12-05 ENCOUNTER — Emergency Department (HOSPITAL_COMMUNITY)
Admission: EM | Admit: 2018-12-05 | Discharge: 2018-12-05 | Disposition: A | Discharge status: Home / Self Care | Payer: Medicaid Other | Attending: Emergency Medicine | Admitting: Emergency Medicine

## 2018-12-05 DIAGNOSIS — R4701 Aphasia: Secondary | ICD-10-CM | POA: Insufficient documentation

## 2018-12-05 DIAGNOSIS — F209 Schizophrenia, unspecified: Secondary | ICD-10-CM | POA: Diagnosis not present

## 2018-12-05 DIAGNOSIS — J45909 Unspecified asthma, uncomplicated: Secondary | ICD-10-CM | POA: Insufficient documentation

## 2018-12-05 DIAGNOSIS — F17210 Nicotine dependence, cigarettes, uncomplicated: Secondary | ICD-10-CM | POA: Diagnosis not present

## 2018-12-05 DIAGNOSIS — Z79899 Other long term (current) drug therapy: Secondary | ICD-10-CM | POA: Insufficient documentation

## 2018-12-05 DIAGNOSIS — E871 Hypo-osmolality and hyponatremia: Secondary | ICD-10-CM

## 2018-12-05 LAB — URINALYSIS, ROUTINE W REFLEX MICROSCOPIC
Bilirubin Urine: NEGATIVE
Glucose, UA: NEGATIVE mg/dL
Hgb urine dipstick: NEGATIVE
Ketones, ur: NEGATIVE mg/dL
Nitrite: NEGATIVE
Protein, ur: NEGATIVE mg/dL
Specific Gravity, Urine: 1.01 (ref 1.005–1.030)
pH: 7 (ref 5.0–8.0)

## 2018-12-05 LAB — COMPREHENSIVE METABOLIC PANEL
ALT: 32 U/L (ref 0–44)
AST: 28 U/L (ref 15–41)
Albumin: 3.5 g/dL (ref 3.5–5.0)
Alkaline Phosphatase: 78 U/L (ref 38–126)
Anion gap: 8 (ref 5–15)
BUN: 7 mg/dL — ABNORMAL LOW (ref 8–23)
CO2: 21 mmol/L — ABNORMAL LOW (ref 22–32)
Calcium: 9.6 mg/dL (ref 8.9–10.3)
Chloride: 104 mmol/L (ref 98–111)
Creatinine, Ser: 0.8 mg/dL (ref 0.44–1.00)
GFR calc Af Amer: >60 mL/min (ref >60)
GFR calc non Af Amer: >60 mL/min (ref >60)
Glucose, Bld: 120 mg/dL — ABNORMAL HIGH (ref 70–99)
Potassium: 4 mmol/L (ref 3.5–5.1)
Sodium: 133 mmol/L — ABNORMAL LOW (ref 135–145)
Total Bilirubin: 0.4 mg/dL (ref 0.3–1.2)
Total Protein: 7.2 g/dL (ref 6.5–8.1)

## 2018-12-05 LAB — RAPID URINE DRUG SCREEN, HOSP PERFORMED
Amphetamines: NOT DETECTED
Barbiturates: NOT DETECTED
Benzodiazepines: NOT DETECTED
Cocaine: NOT DETECTED
Opiates: NOT DETECTED
Tetrahydrocannabinol: NOT DETECTED

## 2018-12-05 LAB — APTT: aPTT: 33 seconds (ref 24–36)

## 2018-12-05 LAB — DIFFERENTIAL
Abs Immature Granulocytes: 0.03 10*3/uL (ref 0.00–0.07)
Basophils Absolute: 0 10*3/uL (ref 0.0–0.1)
Basophils Relative: 0 %
Eosinophils Absolute: 1.4 10*3/uL — ABNORMAL HIGH (ref 0.0–0.5)
Eosinophils Relative: 16 %
Immature Granulocytes: 0 %
Lymphocytes Relative: 32 %
Lymphs Abs: 2.7 10*3/uL (ref 0.7–4.0)
Monocytes Absolute: 0.7 10*3/uL (ref 0.1–1.0)
Monocytes Relative: 8 %
Neutro Abs: 3.6 10*3/uL (ref 1.7–7.7)
Neutrophils Relative %: 44 %

## 2018-12-05 LAB — CBC
HCT: 42.2 % (ref 36.0–46.0)
Hemoglobin: 12.4 g/dL (ref 12.0–15.0)
MCH: 22.5 pg — ABNORMAL LOW (ref 26.0–34.0)
MCHC: 29.4 g/dL — ABNORMAL LOW (ref 30.0–36.0)
MCV: 76.7 fL — ABNORMAL LOW (ref 80.0–100.0)
Platelets: 269 10*3/uL (ref 150–400)
RBC: 5.5 MIL/uL — ABNORMAL HIGH (ref 3.87–5.11)
RDW: 17.4 % — ABNORMAL HIGH (ref 11.5–15.5)
WBC: 8.4 10*3/uL (ref 4.0–10.5)
nRBC: 0 % (ref 0.0–0.2)

## 2018-12-05 LAB — URINALYSIS, MICROSCOPIC (REFLEX)

## 2018-12-05 LAB — ETHANOL: Alcohol, Ethyl (B): <10 mg/dL (ref <10)

## 2018-12-05 LAB — PROTIME-INR
INR: 1.1 (ref 0.8–1.2)
Prothrombin Time: 13.6 seconds (ref 11.4–15.2)

## 2018-12-05 MED ORDER — LORAZEPAM 2 MG/ML IJ SOLN
1.0000 mg | Freq: Once | INTRAMUSCULAR | Status: AC
  Administered 2018-12-05: 05:00:00 1 mg via INTRAVENOUS
  Filled 2018-12-05: qty 1

## 2018-12-05 NOTE — ED Notes (Signed)
Pt. Stated she wants to go home now.

## 2018-12-05 NOTE — ED Notes (Signed)
Patient transported to MRI 

## 2018-12-05 NOTE — ED Triage Notes (Signed)
Pt reports she lives alone and there is no one to pick her up. Pt has key to her houseand her cell phone.

## 2018-12-05 NOTE — ED Triage Notes (Signed)
PT alert and knows she is at [facility]. Pt continues to try to get out of bed. Pt assisted to get  Up with help Pt. Then reports she does not want to get up.

## 2018-12-05 NOTE — ED Triage Notes (Signed)
Pt laced on Bed pan and voided 100 ml.

## 2018-12-05 NOTE — ED Triage Notes (Signed)
Pt arrived via [facility], c/o slurred speech x1 hour. No other complaints at this time. Patient last well at 0100.

## 2018-12-05 NOTE — ED Notes (Signed)
Patient denies pain and is resting comfortably.  

## 2018-12-05 NOTE — ED Notes (Signed)
Pt. Continues to call out while looking at NT. Pt. Keeps asking for food. Pt. Given water to see if she is able to tolerate drinking. Pt. Tolerate water well after tech having to remove mask because pt. Was putting straw up to mask trying to drink through the mask. Pt then proceeded to ask for food again. Pt. Trying to get up from bed

## 2018-12-05 NOTE — ED Triage Notes (Signed)
TC to pt listed info for ride home. Left message on phone of Miss Jeanett Schlein.

## 2018-12-05 NOTE — ED Notes (Signed)
Patient transported to CT 

## 2018-12-05 NOTE — ED Triage Notes (Signed)
Still sleeping and Pt is not able to walk with out Max assistance.

## 2018-12-05 NOTE — Progress Notes (Signed)
Attempted MRI, patient unable to tolerate exam, immediately after noises began patient began banging her hand on the side of the scanner and grabbing at head coil. I tried to calm her but she said to get her out and she could not do this. Spoke with RN, pt may need medication if they want MRI, told him we would call back after doing our next patient

## 2018-12-05 NOTE — ED Provider Notes (Signed)
[place] EMERGENCY DEPARTMENT Provider Note   CSN: [id number] Arrival date & time: 12/05/18  0201    History   Chief Complaint Chief Complaint  Patient presents with  . Aphasia    HPI Karen Best is a 63 y.o. female.   The history is provided by the patient and the EMS personnel.  She has history of asthma, schizophrenia and comes in because of slurred speech.  She states that she was her normal self when she went to sleep at 930 and woke up and noted speech was slurred.  She denies any weakness or numbness or tingling.  She denies any dizziness or incoordination.  EMS reports that family member at home thought her speech was at baseline.  She has had a bifrontal headache since falling about 1 week ago.  Past Medical History:  Diagnosis Date  . Asthma   . Bipolar 1 disorder ([place])   . Fibromyalgia   . Schizophrenia ([place])   . Tobacco abuse     Patient Active Problem List   Diagnosis Date Noted  . 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 Psychosis ([place]) 09/16/2013  . Schizophrenia [facility])     Past Surgical History:  Procedure Laterality Date  . CESAREAN SECTION    . TUBAL LIGATION       OB History   No obstetric history on file.      Home Medications    Prior to Admission medications   Medication Sig Start Date End Date Taking? Authorizing Provider  ciprofloxacin (CIPRO) 500 MG tablet Take 1 tablet (500 mg total) by mouth 2 (two) times daily. One po bid x 7 days 11/09/18   Veryl Speak, MD  Glecaprevir-Pibrentasvir (MAVYRET) 100-40 MG TABS Take 3 tablets by mouth daily with breakfast. 06/30/18   Kuppelweiser, Cassie L, RPH-CPP  hydrOXYzine (ATARAX/VISTARIL) 50 MG tablet Take 50 mg by mouth at bedtime.    [provider]  lithium carbonate (ESKALITH) 450 MG CR tablet Take 450 mg by mouth 2 (two) times daily.    [provider]  OLANZapine (ZYPREXA) 15 MG tablet Take 30 mg by mouth at bedtime.    [provider]  ranitidine (ZANTAC) 150 MG tablet Take 1 tablet (150 mg total) by mouth 2 (two) times daily. 10/13/17   Street, [place], PA-C  Valbenazine Tosylate (INGREZZA) 80 MG CAPS Take 1 tablet by mouth daily.    [provider]  cetirizine (ZYRTEC) 10 MG tablet Take 10 mg by mouth daily.  10/26/18  [provider]    Family History Family History  Problem Relation Age of Onset  . Liver cancer Neg Hx   . Liver disease Neg Hx     Social History Social History   Tobacco Use  . Smoking status: Current Every Day Smoker    Packs/day: 0.50    Types: Cigarettes  . Smokeless tobacco: Never Used  Substance Use Topics  . Alcohol use: Yes    Comment: occ  . Drug use: Yes    Types: Cocaine    Comment: crack cocaine (last use 10/25/2018)     Allergies   Penicillins   Review of Systems Review of Systems  All other systems reviewed and are negative.    Physical Exam Updated Vital Signs BP 106/67   Pulse (!) 53   Temp 97.7 F (36.5 C) (Oral)   Resp 16   Ht 5\' 5"  (1.651 m)   Wt 56.7 kg   SpO2 99%  BMI 20.80 kg/m   Physical Exam Vitals signs and nursing note reviewed.    63 year old female, resting comfortably and in no acute distress. Vital signs are significant for mildly slow heart rate. Oxygen saturation is 99%, which is normal. Head is normocephalic and atraumatic. PERRLA, EOMI. Oropharynx is clear. Neck is nontender and supple without adenopathy or JVD. Back is nontender and there is no CVA tenderness. Lungs are clear without rales, wheezes, or rhonchi. Chest is nontender. Heart has regular rate and rhythm without murmur. Abdomen is soft, flat, nontender without masses or hepatosplenomegaly and peristalsis is normoactive. Extremities have no cyanosis or edema, full range of motion is present. Skin is warm and dry without rash. Neurologic: Awake and oriented to person and place but not time, speech is dysarthric but appropriate in content  and understanding, cranial nerves are intact, there are no motor or sensory deficits (although she does not cooperate very well with neurologic exam).  ED Treatments / Results  Labs (all labs ordered are listed, but only abnormal results are displayed) Labs Reviewed  CBC - Abnormal; Notable for the following components:      Result Value   RBC 5.50 (*)    MCV 76.7 (*)    MCH 22.5 (*)    MCHC 29.4 (*)    RDW 17.4 (*)    All other components within normal limits  DIFFERENTIAL - Abnormal; Notable for the following components:   Eosinophils Absolute 1.4 (*)    All other components within normal limits  COMPREHENSIVE METABOLIC PANEL - Abnormal; Notable for the following components:   Sodium 133 (*)    CO2 21 (*)    Glucose, Bld 120 (*)    BUN 7 (*)    All other components within normal limits  URINALYSIS, ROUTINE W REFLEX MICROSCOPIC - Abnormal; Notable for the following components:   Leukocytes,Ua LARGE (*)    All other components within normal limits  URINALYSIS, MICROSCOPIC (REFLEX) - Abnormal; Notable for the following components:   Bacteria, UA MANY (*)    All other components within normal limits  ETHANOL  PROTIME-INR  APTT  RAPID URINE DRUG SCREEN, HOSP PERFORMED    EKG Sinus bradycardia 54 bpm.  Normal axis.  Normal intervals.  Borderline ST elevation of anterior leads.  Early R wave transition across the precordium.  When compared with ECG November 09, 2018, no significant changes are seen.  Radiology Ct Head Wo Contrast  Result Date: 12/05/2018 CLINICAL DATA:  Slurred speech for 1 hour EXAM: CT HEAD WITHOUT CONTRAST TECHNIQUE: Contiguous axial images were obtained from the base of the skull through the vertex without intravenous contrast. COMPARISON:  11/09/2018 FINDINGS: Brain: Mild atrophic changes are noted. No findings to suggest acute hemorrhage, acute infarction or space-occupying mass lesion are noted. Vascular: No hyperdense vessel or unexpected calcification. Skull:  Normal. Negative for fracture or focal lesion. Sinuses/Orbits: No acute finding. Other: None. IMPRESSION: Atrophic changes without acute abnormality. Electronically Signed   By: Inez Catalina M.D.   On: 12/05/2018 03:04   Mr Brain Wo Contrast  Result Date: 12/05/2018 CLINICAL DATA:  Slurred speech for 1 hour EXAM: MRI HEAD WITHOUT CONTRAST TECHNIQUE: Multiplanar, multiecho pulse sequences of the brain and surrounding structures were obtained without intravenous contrast. COMPARISON:  09/26/2018 brain MRI FINDINGS: BRAIN: There is no acute infarct, acute hemorrhage or extra-axial collection. The white matter signal is normal for the patient's age. The cerebral and cerebellar volume are age-appropriate. There is no hydrocephalus. The midline  structures are normal. VASCULAR: Susceptibility-sensitive sequences show no chronic microhemorrhage or superficial siderosis. The major intracranial arterial and venous sinus flow voids are normal. SKULL AND UPPER CERVICAL SPINE: Calvarial bone marrow signal is normal. There is no skull base mass. The visualized upper cervical spine and soft tissues are normal. SINUSES/ORBITS: There are no fluid levels or advanced mucosal thickening. Right mastoid effusion, unchanged. The orbits are normal. IMPRESSION: No acute intracranial abnormality.  Normal aging brain. Electronically Signed   By: Ulyses Jarred M.D.   On: 12/05/2018 07:00    Procedures Procedures   Medications Ordered in ED Medications  LORazepam (ATIVAN) injection 1 mg (1 mg Intravenous Given 12/05/18 0449)     Initial Impression / Assessment and Plan / ED Course  I have reviewed the triage vital signs and the nursing notes.  Pertinent labs & imaging results that were available during my care of the patient were reviewed by me and considered in my medical decision making (see chart for details).  Possible stroke with altered speech.  Old records are reviewed, and it is not clear if she has any ongoing speech  problems.  She is sent for CT of the head which was unremarkable.  Screening labs were obtained which were significant for mild hyponatremia, not clinically significant.  ECG is unchanged from prior.  She is sent for MRI of the brain which shows no evidence of stroke.  She is felt to be safe for discharge and is advised to follow-up with PCP as needed.  Final Clinical Impressions(s) / ED Diagnoses   Final diagnoses:  Aphasia  Hyponatremia    ED Discharge Orders    None       Delora Fuel, MD 81/85/90 [phone]

## 2018-12-05 NOTE — ED Notes (Signed)
Pt ambulated to restroom with 1 assist. Gait is more steady at this time.  Pt is eating and drinking with no issues.

## 2018-12-05 NOTE — Discharge Instructions (Addendum)
Your evaluation today showed no sign of a stroke.  Please follow-up with your primary care provider.

## 2018-12-05 NOTE — ED Notes (Signed)
Pt. Given lunch bag and water.

## 2018-12-05 NOTE — ED Notes (Signed)
Pt is ambulatory to restroom with with 2 assist.  Pt is still groggy from medication.  Pt has an unsteady gait at this time.   Assisted with return to bed.

## 2018-12-05 NOTE — ED Triage Notes (Signed)
Pt received Ativan for MRI this AM. Pt sleepy and name had to be called several times to wake her up. Pt alert to x2 . Tc placed and message left for only contact on Pt chart.

## 2018-12-05 NOTE — Progress Notes (Signed)
CSW attempted to meet with patient regarding discharge. CSW noted patient closed her eyes when he entered and did not acknowledge multiple prompts by social worker to discuss supports. CSW will attempt again.  Lamonte Richer, LCSW, [place] Worker II [phone]

## 2018-12-06 ENCOUNTER — Inpatient Hospital Stay (HOSPITAL_COMMUNITY)
Admission: EM | Admit: 2018-12-06 | Discharge: 2018-12-16 | DRG: 689 | Disposition: A | Discharge status: Skilled Nursing Facility | Payer: Medicaid Other | Attending: Internal Medicine | Admitting: Internal Medicine

## 2018-12-06 ENCOUNTER — Encounter (HOSPITAL_COMMUNITY): Payer: Self-pay | Admitting: Emergency Medicine

## 2018-12-06 ENCOUNTER — Other Ambulatory Visit: Payer: Self-pay

## 2018-12-06 DIAGNOSIS — E872 Acidosis: Secondary | ICD-10-CM | POA: Diagnosis present

## 2018-12-06 DIAGNOSIS — F05 Delirium due to known physiological condition: Secondary | ICD-10-CM | POA: Diagnosis present

## 2018-12-06 DIAGNOSIS — J45909 Unspecified asthma, uncomplicated: Secondary | ICD-10-CM | POA: Diagnosis present

## 2018-12-06 DIAGNOSIS — Z880 Allergy status to penicillin: Secondary | ICD-10-CM

## 2018-12-06 DIAGNOSIS — F0390 Unspecified dementia without behavioral disturbance: Secondary | ICD-10-CM | POA: Diagnosis present

## 2018-12-06 DIAGNOSIS — G9341 Metabolic encephalopathy: Secondary | ICD-10-CM | POA: Diagnosis present

## 2018-12-06 DIAGNOSIS — N390 Urinary tract infection, site not specified: Principal | ICD-10-CM | POA: Diagnosis present

## 2018-12-06 DIAGNOSIS — R7989 Other specified abnormal findings of blood chemistry: Secondary | ICD-10-CM

## 2018-12-06 DIAGNOSIS — N3000 Acute cystitis without hematuria: Secondary | ICD-10-CM | POA: Diagnosis not present

## 2018-12-06 DIAGNOSIS — R945 Abnormal results of liver function studies: Secondary | ICD-10-CM

## 2018-12-06 DIAGNOSIS — D509 Iron deficiency anemia, unspecified: Secondary | ICD-10-CM | POA: Diagnosis present

## 2018-12-06 DIAGNOSIS — B182 Chronic viral hepatitis C: Secondary | ICD-10-CM

## 2018-12-06 DIAGNOSIS — Z20828 Contact with and (suspected) exposure to other viral communicable diseases: Secondary | ICD-10-CM | POA: Diagnosis present

## 2018-12-06 DIAGNOSIS — M797 Fibromyalgia: Secondary | ICD-10-CM | POA: Diagnosis present

## 2018-12-06 DIAGNOSIS — R471 Dysarthria and anarthria: Secondary | ICD-10-CM | POA: Diagnosis present

## 2018-12-06 DIAGNOSIS — Z1629 Resistance to other single specified antibiotic: Secondary | ICD-10-CM | POA: Diagnosis present

## 2018-12-06 DIAGNOSIS — B1920 Unspecified viral hepatitis C without hepatic coma: Secondary | ICD-10-CM | POA: Diagnosis present

## 2018-12-06 DIAGNOSIS — F17210 Nicotine dependence, cigarettes, uncomplicated: Secondary | ICD-10-CM | POA: Diagnosis present

## 2018-12-06 DIAGNOSIS — F319 Bipolar disorder, unspecified: Secondary | ICD-10-CM | POA: Diagnosis present

## 2018-12-06 DIAGNOSIS — B9620 Unspecified Escherichia coli [E. coli] as the cause of diseases classified elsewhere: Secondary | ICD-10-CM | POA: Diagnosis present

## 2018-12-06 DIAGNOSIS — E871 Hypo-osmolality and hyponatremia: Secondary | ICD-10-CM | POA: Diagnosis not present

## 2018-12-06 DIAGNOSIS — Z79899 Other long term (current) drug therapy: Secondary | ICD-10-CM

## 2018-12-06 DIAGNOSIS — F209 Schizophrenia, unspecified: Secondary | ICD-10-CM | POA: Diagnosis present

## 2018-12-06 LAB — CBC WITH DIFFERENTIAL/PLATELET
Abs Immature Granulocytes: 0.02 10*3/uL (ref 0.00–0.07)
Basophils Absolute: 0 10*3/uL (ref 0.0–0.1)
Basophils Relative: 0 %
Eosinophils Absolute: 1.4 10*3/uL — ABNORMAL HIGH (ref 0.0–0.5)
Eosinophils Relative: 19 %
HCT: 42.6 % (ref 36.0–46.0)
Hemoglobin: 12.8 g/dL (ref 12.0–15.0)
Immature Granulocytes: 0 %
Lymphocytes Relative: 28 %
Lymphs Abs: 2.1 10*3/uL (ref 0.7–4.0)
MCH: 22.6 pg — ABNORMAL LOW (ref 26.0–34.0)
MCHC: 30 g/dL (ref 30.0–36.0)
MCV: 75.3 fL — ABNORMAL LOW (ref 80.0–100.0)
Monocytes Absolute: 0.5 10*3/uL (ref 0.1–1.0)
Monocytes Relative: 7 %
Neutro Abs: 3.5 10*3/uL (ref 1.7–7.7)
Neutrophils Relative %: 46 %
Platelets: 292 10*3/uL (ref 150–400)
RBC: 5.66 MIL/uL — ABNORMAL HIGH (ref 3.87–5.11)
RDW: 16.9 % — ABNORMAL HIGH (ref 11.5–15.5)
WBC: 7.6 10*3/uL (ref 4.0–10.5)
nRBC: 0 % (ref 0.0–0.2)

## 2018-12-06 LAB — COMPREHENSIVE METABOLIC PANEL
ALT: 29 U/L (ref 0–44)
AST: 28 U/L (ref 15–41)
Albumin: 3.6 g/dL (ref 3.5–5.0)
Alkaline Phosphatase: 75 U/L (ref 38–126)
Anion gap: 10 (ref 5–15)
BUN: 10 mg/dL (ref 8–23)
CO2: 21 mmol/L — ABNORMAL LOW (ref 22–32)
Calcium: 10.2 mg/dL (ref 8.9–10.3)
Chloride: 104 mmol/L (ref 98–111)
Creatinine, Ser: 0.81 mg/dL (ref 0.44–1.00)
GFR calc Af Amer: >60 mL/min (ref >60)
GFR calc non Af Amer: >60 mL/min (ref >60)
Glucose, Bld: 105 mg/dL — ABNORMAL HIGH (ref 70–99)
Potassium: 3.6 mmol/L (ref 3.5–5.1)
Sodium: 135 mmol/L (ref 135–145)
Total Bilirubin: 0.7 mg/dL (ref 0.3–1.2)
Total Protein: 7.8 g/dL (ref 6.5–8.1)

## 2018-12-06 LAB — LITHIUM LEVEL: Lithium Lvl: 0.87 mmol/L (ref 0.60–1.20)

## 2018-12-06 LAB — URINALYSIS, ROUTINE W REFLEX MICROSCOPIC
Bilirubin Urine: NEGATIVE
Glucose, UA: NEGATIVE mg/dL
Hgb urine dipstick: NEGATIVE
Ketones, ur: NEGATIVE mg/dL
Nitrite: NEGATIVE
Protein, ur: NEGATIVE mg/dL
Specific Gravity, Urine: 1.01 (ref 1.005–1.030)
pH: 6 (ref 5.0–8.0)

## 2018-12-06 MED ORDER — ACETAMINOPHEN 650 MG RE SUPP: 650.0000 mg | Freq: Four times a day (QID) | RECTAL | Status: DC | PRN

## 2018-12-06 MED ORDER — OLANZAPINE 10 MG PO TABS
20.0000 mg | ORAL_TABLET | Freq: Every day | ORAL | Status: DC
  Administered 2018-12-07 – 2018-12-15 (×10): 20 mg via ORAL
  Filled 2018-12-06 (×12): qty 2

## 2018-12-06 MED ORDER — SODIUM CHLORIDE 0.9 % IV SOLN
INTRAVENOUS | Status: AC
  Administered 2018-12-06 – 2018-12-07 (×2): via INTRAVENOUS

## 2018-12-06 MED ORDER — ACETAMINOPHEN 325 MG PO TABS
650.0000 mg | ORAL_TABLET | Freq: Four times a day (QID) | ORAL | Status: DC | PRN
  Administered 2018-12-07 – 2018-12-15 (×12): 650 mg via ORAL
  Filled 2018-12-06 (×12): qty 2

## 2018-12-06 MED ORDER — LEVOFLOXACIN IN D5W 500 MG/100ML IV SOLN
500.0000 mg | Freq: Once | INTRAVENOUS | Status: DC
  Filled 2018-12-06: qty 100

## 2018-12-06 MED ORDER — ENOXAPARIN SODIUM 40 MG/0.4ML ~~LOC~~ SOLN
40.0000 mg | SUBCUTANEOUS | Status: DC
  Administered 2018-12-07 – 2018-12-16 (×9): 40 mg via SUBCUTANEOUS
  Filled 2018-12-06 (×9): qty 0.4

## 2018-12-06 MED ORDER — SODIUM CHLORIDE 0.9 % IV SOLN
INTRAVENOUS | Status: DC
  Administered 2018-12-06: 21:00:00 via INTRAVENOUS

## 2018-12-06 MED ORDER — LITHIUM CARBONATE ER 450 MG PO TBCR
450.0000 mg | EXTENDED_RELEASE_TABLET | Freq: Two times a day (BID) | ORAL | Status: DC
  Administered 2018-12-07 – 2018-12-16 (×20): 450 mg via ORAL
  Filled 2018-12-06 (×21): qty 1

## 2018-12-06 MED ORDER — OLANZAPINE 10 MG PO TABS
10.0000 mg | ORAL_TABLET | Freq: Every day | ORAL | Status: DC
  Administered 2018-12-07 – 2018-12-15 (×10): 10 mg via ORAL
  Filled 2018-12-06 (×10): qty 1

## 2018-12-06 MED ORDER — SODIUM CHLORIDE 0.9 % IV SOLN
1.0000 g | INTRAVENOUS | Status: DC
  Administered 2018-12-06 – 2018-12-07 (×2): 1 g via INTRAVENOUS
  Filled 2018-12-06 (×2): qty 10

## 2018-12-06 NOTE — ED Triage Notes (Signed)
Patient coming from home.  Family at home states that around 5pm she started becoming lethargic.  Patient is not having slurred speech, but is slow and garbled.  Patient admitted to EMS that she did take her home meds which included some BH meds and muscle relaxer.  Patient does have equal hand grips, no drift, no facial droop.  She is able to speak, just long and drawn out per EMS.

## 2018-12-06 NOTE — ED Provider Notes (Signed)
[facility] EMERGENCY DEPARTMENT Provider Note   CSN: [id number] Arrival date & time: 12/06/18  2034     History   Chief Complaint Chief Complaint  Patient presents with  . Weakness    HPI Karen Best is a 63 y.o. female.     63 year old female presents with about 1 hour of slurred speech and difficulty getting her words out.  Was seen here 24 hours ago for similar symptoms and that work-up included a negative head CT as well as MRI of the brain.  Stated that when she left here yesterday morning she felt fine and then today at home her symptoms return.  No associated headache.  No emesis.  No change to her medications.  Patient does have a history of schizophrenia as well as bipolar disorder.  Denies any current psychiatric illness.  Her symptoms have now resolved and she feels somewhat back to her baseline.     Past Medical History:  Diagnosis Date  . Asthma   . Bipolar 1 disorder ([place])   . Fibromyalgia   . Schizophrenia ([place])   . Tobacco abuse     Patient Active Problem List   Diagnosis Date Noted  . 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 Psychosis ([place]) 09/16/2013  . Schizophrenia [facility])     Past Surgical History:  Procedure Laterality Date  . CESAREAN SECTION    . TUBAL LIGATION       OB History   No obstetric history on file.      Home Medications    Prior to Admission medications   Medication Sig Start Date End Date Taking? Authorizing Provider  cetirizine (ZYRTEC) 10 MG tablet Take 10 mg by mouth daily.    [provider]  famotidine (PEPCID) 40 MG tablet Take 40 mg by mouth 2 (two) times daily.    [provider]  Glecaprevir-Pibrentasvir (MAVYRET) 100-40 MG TABS Take 3 tablets by mouth daily with breakfast. Patient not taking: Reported on 12/05/2018 06/30/18   Kuppelweiser, Cassie L, RPH-CPP  hydrOXYzine (VISTARIL) 50 MG capsule Take 50 mg by mouth at bedtime.    [provider]  lithium  carbonate (ESKALITH) 450 MG CR tablet Take 450 mg by mouth 2 (two) times daily.    [provider]  OLANZapine (ZYPREXA) 10 MG tablet Take 10 mg by mouth at bedtime. Take with olanzapine 20 mg to equal 30 mg    [provider]  OLANZapine (ZYPREXA) 20 MG tablet Take 20 mg by mouth at bedtime. Take with Olanzapine 10 mg to equal 30 mg    [provider]  ranitidine (ZANTAC) 150 MG tablet Take 1 tablet (150 mg total) by mouth 2 (two) times daily. Patient not taking: Reported on 12/05/2018 10/13/17   Street, [place], PA-C  Valbenazine Tosylate [facility]) 80 MG CAPS Take 1 tablet by mouth at bedtime.     [provider]    Family History Family History  Problem Relation Age of Onset  . Liver cancer Neg Hx   . Liver disease Neg Hx     Social History Social History   Tobacco Use  . Smoking status: Current Every Day Smoker    Packs/day: 0.50    Types: Cigarettes  . Smokeless tobacco: Never Used  Substance Use Topics  . Alcohol use: Yes    Comment: occ  . Drug use: Yes    Types: Cocaine    Comment: crack cocaine (last use 10/25/2018)  Allergies   Penicillins   Review of Systems Review of Systems  All other systems reviewed and are negative.    Physical Exam Updated Vital Signs BP 138/78   Pulse 66   Temp 97.6 F (36.4 C) (Oral)   Resp 17   SpO2 99%   Physical Exam Vitals signs and nursing note reviewed.  Constitutional:      General: She is not in acute distress.    Appearance: Normal appearance. She is well-developed. She is not toxic-appearing.  HENT:     Head: Normocephalic and atraumatic.  Eyes:     General: Lids are normal.     Conjunctiva/sclera: Conjunctivae normal.     Pupils: Pupils are equal, round, and reactive to light.  Neck:     Musculoskeletal: Normal range of motion and neck supple.     Thyroid: No thyroid mass.     Trachea: No tracheal deviation.  Cardiovascular:     Rate and Rhythm: Normal rate and  regular rhythm.     Heart sounds: Normal heart sounds. No murmur. No gallop.   Pulmonary:     Effort: Pulmonary effort is normal. No respiratory distress.     Breath sounds: Normal breath sounds. No stridor. No decreased breath sounds, wheezing, rhonchi or rales.  Abdominal:     General: Bowel sounds are normal. There is no distension.     Palpations: Abdomen is soft.     Tenderness: There is no abdominal tenderness. There is no rebound.  Musculoskeletal: Normal range of motion.        General: No tenderness.  Skin:    General: Skin is warm and dry.     Findings: No abrasion or rash.  Neurological:     Mental Status: She is alert and oriented to person, place, and time.     GCS: GCS eye subscore is 4. GCS verbal subscore is 5. GCS motor subscore is 6.     Cranial Nerves: No cranial nerve deficit.     Sensory: No sensory deficit.     Coordination: Finger-Nose-Finger Test normal.     Comments: Strength is 5 of 5 in upper as well as lower extremities.  No facial asymmetry noted.  Questionable dysarthria.  Psychiatric:        Speech: Speech normal.        Behavior: Behavior normal.      ED Treatments / Results  Labs (all labs ordered are listed, but only abnormal results are displayed) Labs Reviewed  URINE CULTURE  SARS CORONAVIRUS 2  CBC WITH DIFFERENTIAL/PLATELET  COMPREHENSIVE METABOLIC PANEL  URINALYSIS, ROUTINE W REFLEX MICROSCOPIC  LITHIUM LEVEL    EKG None  Radiology Ct Head Wo Contrast  Result Date: 12/05/2018 CLINICAL DATA:  Slurred speech for 1 hour EXAM: CT HEAD WITHOUT CONTRAST TECHNIQUE: Contiguous axial images were obtained from the base of the skull through the vertex without intravenous contrast. COMPARISON:  11/09/2018 FINDINGS: Brain: Mild atrophic changes are noted. No findings to suggest acute hemorrhage, acute infarction or space-occupying mass lesion are noted. Vascular: No hyperdense vessel or unexpected calcification. Skull: Normal. Negative for  fracture or focal lesion. Sinuses/Orbits: No acute finding. Other: None. IMPRESSION: Atrophic changes without acute abnormality. Electronically Signed   By: Inez Catalina M.D.   On: 12/05/2018 03:04   Mr Brain Wo Contrast  Result Date: 12/05/2018 CLINICAL DATA:  Slurred speech for 1 hour EXAM: MRI HEAD WITHOUT CONTRAST TECHNIQUE: Multiplanar, multiecho pulse sequences of the brain and surrounding structures were obtained without  intravenous contrast. COMPARISON:  09/26/2018 brain MRI FINDINGS: BRAIN: There is no acute infarct, acute hemorrhage or extra-axial collection. The white matter signal is normal for the patient's age. The cerebral and cerebellar volume are age-appropriate. There is no hydrocephalus. The midline structures are normal. VASCULAR: Susceptibility-sensitive sequences show no chronic microhemorrhage or superficial siderosis. The major intracranial arterial and venous sinus flow voids are normal. SKULL AND UPPER CERVICAL SPINE: Calvarial bone marrow signal is normal. There is no skull base mass. The visualized upper cervical spine and soft tissues are normal. SINUSES/ORBITS: There are no fluid levels or advanced mucosal thickening. Right mastoid effusion, unchanged. The orbits are normal. IMPRESSION: No acute intracranial abnormality.  Normal aging brain. Electronically Signed   By: Ulyses Jarred M.D.   On: 12/05/2018 07:00    Procedures Procedures (including critical care time)  Medications Ordered in ED Medications  0.9 %  sodium chloride infusion ( Intravenous New Bag/Given 12/06/18 2103)     Initial Impression / Assessment and Plan / ED Course  I have reviewed the triage vital signs and the nursing notes.  Pertinent labs & imaging results that were available during my care of the patient were reviewed by me and considered in my medical decision making (see chart for details).        Patient is urinalysis shows infection.  Started on IV Levaquin due to penicillin allergy.   Lithium level is not toxic.  She is afebrile here.  Patient had CT and MRI done 24 hours ago and therefore was not repeated.  Due to patient's recurrent symptoms concern for possible TIA.  Will consult hospitalist for admission  Final Clinical Impressions(s) / ED Diagnoses   Final diagnoses:  None    ED Discharge Orders    None       Lacretia Leigh, MD 12/06/18 2243

## 2018-12-06 NOTE — ED Notes (Signed)
ED TO INPATIENT HANDOFF REPORT  ED Nurse Name and Phone #:  [phone]  S Name/Age/Gender Karen Best 63 y.o. female Room/Bed: 019C/019C  Code Status   Code Status: Full Code  Home/SNF/Other Home Patient oriented to: self, place, time and situation Is this baseline? Yes   Triage Complete: Triage complete  Chief Complaint Weakness; Stroke Symptoms  Triage Note Patient coming from home.  Family at home states that around 5pm she started becoming lethargic.  Patient is not having slurred speech, but is slow and garbled.  Patient admitted to EMS that she did take her home meds which included some BH meds and muscle relaxer.  Patient does have equal hand grips, no drift, no facial droop.  She is able to speak, just long and drawn out per EMS.     Allergies Allergies  Allergen Reactions  . Penicillins Other (See Comments)    Child hood reaction Has patient had a PCN reaction causing immediate rash, facial/tongue/throat swelling, SOB or lightheadedness with hypotension: Yes Has patient had a PCN reaction causing severe rash involving mucus membranes or skin necrosis: No Has patient had a PCN reaction that required hospitalization No Has patient had a PCN reaction occurring within the last 10 years: No If all of the above answers are "NO", then may proceed with C    Level of Care/Admitting Diagnosis ED Disposition    ED Disposition Condition [place]: [place] [[id number]]  Level of Care: Med-Surg [16]  I expect the patient will be discharged within 24 hours: Yes  LOW acuity---Tx typically complete <24 hrs---ACUTE conditions typically can be evaluated <24 hours---LABS likely to return to acceptable levels <24 hours---IS near functional baseline---EXPECTED to return to current living arrangement---NOT newly hypoxic: Meets criteria for 5C-Observation unit  Covid Evaluation: Asymptomatic Screening Protocol (No Symptoms)  Diagnosis: UTI  (urinary tract infection) [993570]  Admitting Physician: Lenore Cordia [[id number]]  Attending Physician: Lenore Cordia [[id number]]  PT Class (Do Not Modify): Observation [104]  PT Acc Code (Do Not Modify): Observation [10022]       B Medical/Surgery History Past Medical History:  Diagnosis Date  . Asthma   . Bipolar 1 disorder ([place])   . Fibromyalgia   . Schizophrenia ([place])   . Tobacco abuse    Past Surgical History:  Procedure Laterality Date  . CESAREAN SECTION    . TUBAL LIGATION       A IV Location/Drains/Wounds Patient Lines/Drains/Airways Status   Active Line/Drains/Airways    Name:   Placement date:   Placement time:   Site:   Days:   Peripheral IV 12/06/18 Left Antecubital   12/06/18    -    Antecubital   less than 1   Urethral Catheter   -    -    -      External Urinary Catheter   12/06/18    2139    -   less than 1          Intake/Output Last 24 hours No intake or output data in the 24 hours ending 12/06/18 2334  Labs/Imaging Results for orders placed or performed during the hospital encounter of 12/06/18 (from the past 48 hour(s))  CBC with Differential/Platelet     Status: Abnormal   Collection Time: 12/06/18  8:45 PM  Result Value Ref Range   WBC 7.6 4.0 - 10.5 K/uL   RBC 5.66 (H) 3.87 - 5.11 MIL/uL   Hemoglobin 12.8  12.0 - 15.0 g/dL   HCT 42.6 36.0 - 46.0 %   MCV 75.3 (L) 80.0 - 100.0 fL   MCH 22.6 (L) 26.0 - 34.0 pg   MCHC 30.0 30.0 - 36.0 g/dL   RDW 16.9 (H) 11.5 - 15.5 %   Platelets 292 150 - 400 K/uL   nRBC 0.0 0.0 - 0.2 %   Neutrophils Relative % 46 %   Neutro Abs 3.5 1.7 - 7.7 K/uL   Lymphocytes Relative 28 %   Lymphs Abs 2.1 0.7 - 4.0 K/uL   Monocytes Relative 7 %   Monocytes Absolute 0.5 0.1 - 1.0 K/uL   Eosinophils Relative 19 %   Eosinophils Absolute 1.4 (H) 0.0 - 0.5 K/uL   Basophils Relative 0 %   Basophils Absolute 0.0 0.0 - 0.1 K/uL   Immature Granulocytes 0 %   Abs Immature Granulocytes 0.02 0.00 - 0.07 K/uL    Comment:  Performed at [facility] [address], [place], [place] [address]  Comprehensive metabolic panel     Status: Abnormal   Collection Time: 12/06/18  8:45 PM  Result Value Ref Range   Sodium 135 135 - 145 mmol/L   Potassium 3.6 3.5 - 5.1 mmol/L   Chloride 104 98 - 111 mmol/L   CO2 21 (L) 22 - 32 mmol/L   Glucose, Bld 105 (H) 70 - 99 mg/dL   BUN 10 8 - 23 mg/dL   Creatinine, Ser 0.81 0.44 - 1.00 mg/dL   Calcium 10.2 8.9 - 10.3 mg/dL   Total Protein 7.8 6.5 - 8.1 g/dL   Albumin 3.6 3.5 - 5.0 g/dL   AST 28 15 - 41 U/L   ALT 29 0 - 44 U/L   Alkaline Phosphatase 75 38 - 126 U/L   Total Bilirubin 0.7 0.3 - 1.2 mg/dL   GFR calc non Af Amer >60 >60 mL/min   GFR calc Af Amer >60 >60 mL/min   Anion gap 10 5 - 15    Comment: Performed at [facility], [place] [address], [place], [place] [address]  Lithium level     Status: None   Collection Time: 12/06/18  8:58 PM  Result Value Ref Range   Lithium Lvl 0.87 0.60 - 1.20 mmol/L    Comment: Performed at [facility] [address], [place], [place] [address]  Urinalysis, Routine w reflex microscopic     Status: Abnormal   Collection Time: 12/06/18  9:48 PM  Result Value Ref Range   Color, Urine AMBER (A) YELLOW    Comment: BIOCHEMICALS MAY BE AFFECTED BY COLOR   APPearance CLOUDY (A) CLEAR   Specific Gravity, Urine 1.010 1.005 - 1.030   pH 6.0 5.0 - 8.0   Glucose, UA NEGATIVE NEGATIVE mg/dL   Hgb urine dipstick NEGATIVE NEGATIVE   Bilirubin Urine NEGATIVE NEGATIVE   Ketones, ur NEGATIVE NEGATIVE mg/dL   Protein, ur NEGATIVE NEGATIVE mg/dL   Nitrite NEGATIVE NEGATIVE   Leukocytes,Ua LARGE (A) NEGATIVE   RBC / HPF 0-5 0 - 5 RBC/hpf   WBC, UA 6-10 0 - 5 WBC/hpf   Bacteria, UA MANY (A) NONE SEEN   Squamous Epithelial / LPF 0-5 0 - 5    Comment: Performed at [facility], [place] [address], [place], [place] [address]   Ct Head Wo Contrast  Result Date: 12/05/2018 CLINICAL DATA:  Slurred speech for 1 hour EXAM: CT HEAD  WITHOUT CONTRAST TECHNIQUE: Contiguous axial images were  obtained from the base of the skull through the vertex without intravenous contrast. COMPARISON:  11/09/2018 FINDINGS: Brain: Mild atrophic changes are noted. No findings to suggest acute hemorrhage, acute infarction or space-occupying mass lesion are noted. Vascular: No hyperdense vessel or unexpected calcification. Skull: Normal. Negative for fracture or focal lesion. Sinuses/Orbits: No acute finding. Other: None. IMPRESSION: Atrophic changes without acute abnormality. Electronically Signed   By: Inez Catalina M.D.   On: 12/05/2018 03:04   Mr Brain Wo Contrast  Result Date: 12/05/2018 CLINICAL DATA:  Slurred speech for 1 hour EXAM: MRI HEAD WITHOUT CONTRAST TECHNIQUE: Multiplanar, multiecho pulse sequences of the brain and surrounding structures were obtained without intravenous contrast. COMPARISON:  09/26/2018 brain MRI FINDINGS: BRAIN: There is no acute infarct, acute hemorrhage or extra-axial collection. The white matter signal is normal for the patient's age. The cerebral and cerebellar volume are age-appropriate. There is no hydrocephalus. The midline structures are normal. VASCULAR: Susceptibility-sensitive sequences show no chronic microhemorrhage or superficial siderosis. The major intracranial arterial and venous sinus flow voids are normal. SKULL AND UPPER CERVICAL SPINE: Calvarial bone marrow signal is normal. There is no skull base mass. The visualized upper cervical spine and soft tissues are normal. SINUSES/ORBITS: There are no fluid levels or advanced mucosal thickening. Right mastoid effusion, unchanged. The orbits are normal. IMPRESSION: No acute intracranial abnormality.  Normal aging brain. Electronically Signed   By: Ulyses Jarred M.D.   On: 12/05/2018 07:00    Pending Labs Unresulted Labs (From admission, onward)    Start     Ordered   12/07/18 2330  Basic metabolic panel  Tomorrow morning,   R     12/06/18 2334   12/07/18  0500  CBC  Tomorrow morning,   R     12/06/18 2334   12/06/18 2334  TSH  Once,   STAT     12/06/18 2334   12/06/18 2330  Urine rapid drug screen (hosp performed)  Add-on,   AD     12/06/18 2329   12/06/18 2059  SARS CORONAVIRUS 2 Nasal Swab Aptima Multi Swab  (Asymptomatic/Tier 2 Patients Labs)  Once,   STAT    Question Answer Comment  Is this test for diagnosis or screening Screening   Symptomatic for COVID-19 as defined by CDC No   Hospitalized for COVID-19 No   Admitted to ICU for COVID-19 No   Previously tested for COVID-19 Yes   Resident in a congregate (group) care setting No   Employed in healthcare setting No   Pregnant No      12/06/18 2059   12/06/18 2058  Urine Culture  ONCE - STAT,   STAT     12/06/18 2057          Vitals/Pain Today's Vitals   12/06/18 2230 12/06/18 2253 12/06/18 2315 12/06/18 2330  BP: (!) 111/59  127/70 121/63  Pulse: (!) 52     Resp: 14     Temp:  97.8 F (36.6 C)    TempSrc:  Rectal    SpO2: 100%     PainSc:        Isolation Precautions No active isolations  Medications Medications  0.9 %  sodium chloride infusion (has no administration in time range)  cefTRIAXone (ROCEPHIN) 1 g in sodium chloride 0.9 % 100 mL IVPB (has no administration in time range)  enoxaparin (LOVENOX) injection 40 mg (has no administration in time range)  acetaminophen (TYLENOL) tablet 650 mg (has no administration in time range)  Or  acetaminophen (TYLENOL) suppository 650 mg (has no administration in time range)    Mobility walks with device High fall risk   Focused Assessments -   R Recommendations: See Admitting Provider Note  Report given to:   Additional Notes: -

## 2018-12-06 NOTE — H&P (Signed)
History and Physical    Karen Best ZOX:[id number] DOB: [birth year] DOA: 12/06/2018  PCP: [facility], [facility]  Patient coming from: Home  I have personally briefly reviewed patient's old medical records in [place]  Chief Complaint: Speech change  HPI: Karen Best is a 63 y.o. female with medical history significant for schizophrenia and hepatitis C who presents to the ED for evaluation of altered mental status.  History limited from patient due to encephalopathy therefore history is supplemented by EDP and chart review.  Patient initially seen in ED on 12/05/2018 due to reported slurred speech, although there was question about speech abnormality at her baseline.  She had a CT head without contrast which was negative for acute abnormality, atrophic changes noted.  She also had MRI brain without contrast which was negative for acute infarct.  She was discharged home.  Patient brought back to ED 12/06/2018, per ED documentation patient became lethargic around 5 PM with slow and garbled speech.  She reportedly felt fine after being discharged from the ED day prior.  She says she is just feeling unwell and very tired.  She is having some suprapubic pain.  History is otherwise limited due to encephalopathy.   ED Course:  Initial vitals showed BP 111/66, pulse 58, RR 13, temp 97.6 Fahrenheit, SPO2 100% on room air.  Labs notable for sodium 135, potassium 3.6, bicarb 21, BUN 10, creatinine 0.81, normal LFTs, WBC 7.6, hemoglobin 12.8, platelets 292,000, lithium level 0.7.  Urinalysis was suggestive of UTI.  Urine culture was collected and pending.  SARS-CoV-2 test was obtained and pending.  Patient was given IV Levaquin and the hospitalist service was consulted admit for further evaluation management.   Review of Systems:  Unable to to obtain full review of systems due to encephalopathy.   Past Medical History:  Diagnosis Date   Asthma    Bipolar 1  disorder ([place])    Fibromyalgia    Schizophrenia ([place])    Tobacco abuse     Past Surgical History:  Procedure Laterality Date   CESAREAN SECTION     TUBAL LIGATION      Social History:  reports that she has been smoking cigarettes. She has been smoking about 0.50 packs per day. She has never used smokeless tobacco. She reports current alcohol use. She reports current drug use. Drug: Cocaine.  Allergies  Allergen Reactions   Penicillins Other (See Comments)    Child hood reaction Has patient had a PCN reaction causing immediate rash, facial/tongue/throat swelling, SOB or lightheadedness with hypotension: Yes Has patient had a PCN reaction causing severe rash involving mucus membranes or skin necrosis: No Has patient had a PCN reaction that required hospitalization No Has patient had a PCN reaction occurring within the last 10 years: No If all of the above answers are "NO", then may proceed with C    Family History  Problem Relation Age of Onset   Liver cancer Neg Hx    Liver disease Neg Hx      Prior to Admission medications   Medication Sig Start Date End Date Taking? Authorizing Provider  cetirizine (ZYRTEC) 10 MG tablet Take 10 mg by mouth daily.    [provider]  famotidine (PEPCID) 40 MG tablet Take 40 mg by mouth 2 (two) times daily.    [provider]  Glecaprevir-Pibrentasvir (MAVYRET) 100-40 MG TABS Take 3 tablets by mouth daily with breakfast. Patient not taking: Reported on 12/05/2018 06/30/18  Kuppelweiser, Cassie L, RPH-CPP  hydrOXYzine (VISTARIL) 50 MG capsule Take 50 mg by mouth at bedtime.    [provider]  lithium carbonate (ESKALITH) 450 MG CR tablet Take 450 mg by mouth 2 (two) times daily.    [provider]  OLANZapine (ZYPREXA) 10 MG tablet Take 10 mg by mouth at bedtime. Take with olanzapine 20 mg to equal 30 mg    [provider]  OLANZapine (ZYPREXA) 20 MG tablet Take 20 mg by mouth at bedtime. Take  with Olanzapine 10 mg to equal 30 mg    [provider]  ranitidine (ZANTAC) 150 MG tablet Take 1 tablet (150 mg total) by mouth 2 (two) times daily. Patient not taking: Reported on 12/05/2018 10/13/17   Street, [place], PA-C  Valbenazine Tosylate [facility]) 80 MG CAPS Take 1 tablet by mouth at bedtime.     [provider]    Physical Exam: Vitals:   12/06/18 2130 12/06/18 2200 12/06/18 2230 12/06/18 2253  BP: 115/66 (!) 118/59 (!) 111/59   Pulse: (!) 57 (!) 55 (!) 52   Resp: 12  14   Temp:    97.8 F (36.6 C)  TempSrc:    Rectal  SpO2: 100% 100% 100%    Limited due to encephalopathy. Constitutional: Thin woman resting supine in bed, very lethargic but awakens to voice.  Speech very slow and soft but intermittently answers with one-word sentences.  Eyes: PERRL, lids and conjunctivae normal ENMT: Mucous membranes are dry. Posterior pharynx clear of any exudate or lesions.Normal dentition.  Neck: normal, supple, no masses. Respiratory: clear to auscultation anteriorly, no wheezing, no crackles. Normal respiratory effort. No accessory muscle use.  Cardiovascular: Regular rate and rhythm, no murmurs / rubs / gallops. No extremity edema. 2+ pedal pulses. Abdomen: Suprapubic tenderness, no masses palpated. No hepatosplenomegaly. Bowel sounds positive.  Musculoskeletal: no clubbing / cyanosis. No joint deformity upper and lower extremities.  Moving all extremities slowly. Skin: no rashes, lesions, ulcers. No induration Neurologic: Limited due to encephalopathy.  Speech slow and soft but not really dysarthric or aphasic.  She is following simple commands intermittently and moves all extremities.  Movement is slow but strength appears intact. Psychiatric: Very lethargic but awakens to voice and oriented to self and place.    Labs on Admission: I have personally reviewed following labs and imaging studies  CBC: Recent Labs  Lab 12/05/18 0210 12/06/18 2045  WBC 8.4 7.6    NEUTROABS 3.6 3.5  HGB 12.4 12.8  HCT 42.2 42.6  MCV 76.7* 75.3*  PLT 269 376   Basic Metabolic Panel: Recent Labs  Lab 12/05/18 0210 12/06/18 2045  NA 133* 135  K 4.0 3.6  CL 104 104  CO2 21* 21*  GLUCOSE 120* 105*  BUN 7* 10  CREATININE 0.80 0.81  CALCIUM 9.6 10.2   GFR: Estimated Creatinine Clearance: 64.5 mL/min (by C-G formula based on SCr of 0.81 mg/dL). Liver Function Tests: Recent Labs  Lab 12/05/18 0210 12/06/18 2045  AST 28 28  ALT 32 29  ALKPHOS 78 75  BILITOT 0.4 0.7  PROT 7.2 7.8  ALBUMIN 3.5 3.6   No results for input(s): LIPASE, AMYLASE in the last 168 hours. No results for input(s): AMMONIA in the last 168 hours. Coagulation Profile: Recent Labs  Lab 12/05/18 0210  INR 1.1   Cardiac Enzymes: No results for input(s): CKTOTAL, CKMB, CKMBINDEX, TROPONINI in the last 168 hours. BNP (last 3 results) No results for input(s): PROBNP in the  last 8760 hours. HbA1C: No results for input(s): HGBA1C in the last 72 hours. CBG: No results for input(s): GLUCAP in the last 168 hours. Lipid Profile: No results for input(s): CHOL, HDL, LDLCALC, TRIG, CHOLHDL, LDLDIRECT in the last 72 hours. Thyroid Function Tests: No results for input(s): TSH, T4TOTAL, FREET4, T3FREE, THYROIDAB in the last 72 hours. Anemia Panel: No results for input(s): VITAMINB12, FOLATE, FERRITIN, TIBC, IRON, RETICCTPCT in the last 72 hours. Urine analysis:    Component Value Date/Time   COLORURINE AMBER (A) 12/06/2018 2148   APPEARANCEUR CLOUDY (A) 12/06/2018 2148   LABSPEC 1.010 12/06/2018 2148   PHURINE 6.0 12/06/2018 2148   GLUCOSEU NEGATIVE 12/06/2018 2148   HGBUR NEGATIVE 12/06/2018 2148   [place] NEGATIVE 12/06/2018 2148   KETONESUR NEGATIVE 12/06/2018 2148   PROTEINUR NEGATIVE 12/06/2018 2148   UROBILINOGEN 0.2 10/26/2018 1355   NITRITE NEGATIVE 12/06/2018 2148   LEUKOCYTESUR LARGE (A) 12/06/2018 2148    Radiological Exams on Admission: Ct Head Wo  Contrast  Result Date: 12/05/2018 CLINICAL DATA:  Slurred speech for 1 hour EXAM: CT HEAD WITHOUT CONTRAST TECHNIQUE: Contiguous axial images were obtained from the base of the skull through the vertex without intravenous contrast. COMPARISON:  11/09/2018 FINDINGS: Brain: Mild atrophic changes are noted. No findings to suggest acute hemorrhage, acute infarction or space-occupying mass lesion are noted. Vascular: No hyperdense vessel or unexpected calcification. Skull: Normal. Negative for fracture or focal lesion. Sinuses/Orbits: No acute finding. Other: None. IMPRESSION: Atrophic changes without acute abnormality. Electronically Signed   By: Inez Catalina M.D.   On: 12/05/2018 03:04   Mr Brain Wo Contrast  Result Date: 12/05/2018 CLINICAL DATA:  Slurred speech for 1 hour EXAM: MRI HEAD WITHOUT CONTRAST TECHNIQUE: Multiplanar, multiecho pulse sequences of the brain and surrounding structures were obtained without intravenous contrast. COMPARISON:  09/26/2018 brain MRI FINDINGS: BRAIN: There is no acute infarct, acute hemorrhage or extra-axial collection. The white matter signal is normal for the patient's age. The cerebral and cerebellar volume are age-appropriate. There is no hydrocephalus. The midline structures are normal. VASCULAR: Susceptibility-sensitive sequences show no chronic microhemorrhage or superficial siderosis. The major intracranial arterial and venous sinus flow voids are normal. SKULL AND UPPER CERVICAL SPINE: Calvarial bone marrow signal is normal. There is no skull base mass. The visualized upper cervical spine and soft tissues are normal. SINUSES/ORBITS: There are no fluid levels or advanced mucosal thickening. Right mastoid effusion, unchanged. The orbits are normal. IMPRESSION: No acute intracranial abnormality.  Normal aging brain. Electronically Signed   By: Ulyses Jarred M.D.   On: 12/05/2018 07:00    EKG: Independently reviewed. Sinus rhythm without acute ischemic  changes.  Assessment/Plan Active Problems:   Schizophrenia ([place])   UTI (urinary tract infection)   Speech abnormality  Karen Best is a 63 y.o. female with medical history significant for schizophrenia and hepatitis C who is admitted for acute metabolic encephalopathy due to UTI.   Acute metabolic encephalopathy: Somnolent with slow speech and body movement.  Presumed secondary to UTI.  Also possible medication effect from home Ingrezza.  She is also dehydrated on examination. -Treat UTI with IV ceftriaxone -Hold home Ingrezza -Continue IV fluid resuscitation overnight -Consider EEG if not significantly improving over the next 24 hours -Check TSH  UTI: Patient with suprapubic tenderness on examination.  Has received cephalosporins in the past.  Prior urine cultures sensitive to ceftriaxone. -Continue IV ceftriaxone -Follow-up urine culture  Schizophrenia: Lithium level 0.7.  Continue home lithium, Zyprexa.  Holding home  Ingrezza as above.  DVT prophylaxis: Lovenox Code Status: Full code Family Communication: None present on admission Disposition Plan: Pending clinical progress Consults called: None Admission status: Observation   Zada Finders MD [facility]  If 7PM-7AM, please contact night-coverage [email or web address]  12/06/2018, 11:02 PM

## 2018-12-07 ENCOUNTER — Other Ambulatory Visit: Payer: Self-pay

## 2018-12-07 DIAGNOSIS — F05 Delirium due to known physiological condition: Secondary | ICD-10-CM | POA: Diagnosis present

## 2018-12-07 DIAGNOSIS — B1920 Unspecified viral hepatitis C without hepatic coma: Secondary | ICD-10-CM | POA: Diagnosis present

## 2018-12-07 DIAGNOSIS — F29 Unspecified psychosis not due to a substance or known physiological condition: Secondary | ICD-10-CM | POA: Diagnosis not present

## 2018-12-07 DIAGNOSIS — J45909 Unspecified asthma, uncomplicated: Secondary | ICD-10-CM | POA: Diagnosis present

## 2018-12-07 DIAGNOSIS — R4182 Altered mental status, unspecified: Secondary | ICD-10-CM | POA: Diagnosis not present

## 2018-12-07 DIAGNOSIS — Z20828 Contact with and (suspected) exposure to other viral communicable diseases: Secondary | ICD-10-CM | POA: Diagnosis present

## 2018-12-07 DIAGNOSIS — D649 Anemia, unspecified: Secondary | ICD-10-CM | POA: Diagnosis not present

## 2018-12-07 DIAGNOSIS — F0390 Unspecified dementia without behavioral disturbance: Secondary | ICD-10-CM | POA: Diagnosis present

## 2018-12-07 DIAGNOSIS — Z880 Allergy status to penicillin: Secondary | ICD-10-CM | POA: Diagnosis not present

## 2018-12-07 DIAGNOSIS — F319 Bipolar disorder, unspecified: Secondary | ICD-10-CM | POA: Diagnosis present

## 2018-12-07 DIAGNOSIS — N390 Urinary tract infection, site not specified: Secondary | ICD-10-CM | POA: Diagnosis present

## 2018-12-07 DIAGNOSIS — Z1629 Resistance to other single specified antibiotic: Secondary | ICD-10-CM | POA: Diagnosis present

## 2018-12-07 DIAGNOSIS — F17210 Nicotine dependence, cigarettes, uncomplicated: Secondary | ICD-10-CM | POA: Diagnosis present

## 2018-12-07 DIAGNOSIS — G9341 Metabolic encephalopathy: Secondary | ICD-10-CM | POA: Diagnosis present

## 2018-12-07 DIAGNOSIS — M797 Fibromyalgia: Secondary | ICD-10-CM | POA: Diagnosis present

## 2018-12-07 DIAGNOSIS — R471 Dysarthria and anarthria: Secondary | ICD-10-CM | POA: Diagnosis present

## 2018-12-07 DIAGNOSIS — E872 Acidosis: Secondary | ICD-10-CM | POA: Diagnosis present

## 2018-12-07 DIAGNOSIS — F209 Schizophrenia, unspecified: Secondary | ICD-10-CM | POA: Diagnosis present

## 2018-12-07 DIAGNOSIS — Z79899 Other long term (current) drug therapy: Secondary | ICD-10-CM | POA: Diagnosis not present

## 2018-12-07 DIAGNOSIS — D509 Iron deficiency anemia, unspecified: Secondary | ICD-10-CM | POA: Diagnosis present

## 2018-12-07 DIAGNOSIS — B9620 Unspecified Escherichia coli [E. coli] as the cause of diseases classified elsewhere: Secondary | ICD-10-CM | POA: Diagnosis present

## 2018-12-07 DIAGNOSIS — N3000 Acute cystitis without hematuria: Secondary | ICD-10-CM | POA: Diagnosis not present

## 2018-12-07 DIAGNOSIS — E871 Hypo-osmolality and hyponatremia: Secondary | ICD-10-CM | POA: Diagnosis not present

## 2018-12-07 LAB — RAPID URINE DRUG SCREEN, HOSP PERFORMED
Amphetamines: NOT DETECTED
Barbiturates: NOT DETECTED
Benzodiazepines: NOT DETECTED
Cocaine: NOT DETECTED
Opiates: NOT DETECTED
Tetrahydrocannabinol: NOT DETECTED

## 2018-12-07 LAB — BASIC METABOLIC PANEL
Anion gap: 8 (ref 5–15)
BUN: 7 mg/dL — ABNORMAL LOW (ref 8–23)
CO2: 22 mmol/L (ref 22–32)
Calcium: 9.5 mg/dL (ref 8.9–10.3)
Chloride: 107 mmol/L (ref 98–111)
Creatinine, Ser: 0.71 mg/dL (ref 0.44–1.00)
GFR calc Af Amer: >60 mL/min (ref >60)
GFR calc non Af Amer: >60 mL/min (ref >60)
Glucose, Bld: 108 mg/dL — ABNORMAL HIGH (ref 70–99)
Potassium: 3.6 mmol/L (ref 3.5–5.1)
Sodium: 137 mmol/L (ref 135–145)

## 2018-12-07 LAB — CBC
HCT: 37.4 % (ref 36.0–46.0)
Hemoglobin: 11.5 g/dL — ABNORMAL LOW (ref 12.0–15.0)
MCH: 23 pg — ABNORMAL LOW (ref 26.0–34.0)
MCHC: 30.7 g/dL (ref 30.0–36.0)
MCV: 74.9 fL — ABNORMAL LOW (ref 80.0–100.0)
Platelets: 280 10*3/uL (ref 150–400)
RBC: 4.99 MIL/uL (ref 3.87–5.11)
RDW: 16.7 % — ABNORMAL HIGH (ref 11.5–15.5)
WBC: 6.9 10*3/uL (ref 4.0–10.5)
nRBC: 0 % (ref 0.0–0.2)

## 2018-12-07 LAB — SARS CORONAVIRUS 2 (TAT 6-24 HRS): SARS Coronavirus 2: NEGATIVE

## 2018-12-07 LAB — TSH: TSH: 0.826 u[IU]/mL (ref 0.350–4.500)

## 2018-12-07 NOTE — ED Notes (Signed)
RN called main lab to request urine rapid drug screen to be added to previous collection. Lab to use previous collection. Per lab tech, tech to call RN if unable to use sample

## 2018-12-07 NOTE — Progress Notes (Signed)
Pt admitted from ED with the diagnosis of UTI, Pt oriented, drowsy but easy to arouse, denies any pain at this time, settled in bed with call light within pt's reach, safety concern explained to pt and initiated, was however reassured and will continue to monitor. [name], Karen Best

## 2018-12-07 NOTE — Progress Notes (Signed)
PROGRESS NOTE    Karen Best  KVQ:[id number] DOB: 1955-04-29 DOA: 12/06/2018 PCP: [facility]   Brief Narrative: Karen Best is a 63 y.o. female with medical history significant for schizophrenia and hepatitis C who presented secondary to altered mental status and found to have a likely UTI.   Assessment & Plan:   Principal Problem:   UTI (urinary tract infection) Active Problems:   Schizophrenia ([place])   Acute metabolic encephalopathy   UTI Patient empirically started on Ceftriaxone. Urine culture is pending. -Urine culture -Continue Ceftriaxone -PT eval  Acute metabolic encephalopathy In setting of infection. Appears to currently be resolved.  Schizophrenia -Continue Zyprexa, Lithium   DVT prophylaxis: Lovenox Code Status:   Code Status: Full Code Family Communication: None Disposition Plan: Discharge pending PT eval and culture results   Consultants:   None  Procedures:   None  Antimicrobials:  Ceftriaxone (8/10>>    Subjective: No issues this morning. No current urinary symptoms  Objective: Vitals:   12/07/18 0029 12/07/18 0327 12/07/18 0900 12/07/18 1300  BP: 116/65 103/68 119/70 122/65  Pulse: (!) 49 (!) 53 (!) 51 (!) 59  Resp: 17 17 17 17   Temp: 98 F (36.7 C) 98.6 F (37 C) 97.7 F (36.5 C) 99.1 F (37.3 C)  TempSrc: Oral Oral Axillary Axillary  SpO2: 100% 100% 100% 100%  Weight: 57 kg     Height: 5\' 5"  (1.651 m)       Intake/Output Summary (Last 24 hours) at 12/07/2018 1439 Last data filed at 12/07/2018 1300 Gross per 24 hour  Intake 604.95 ml  Output 900 ml  Net -295.05 ml   Filed Weights   12/07/18 0029  Weight: 57 kg    Examination:  General exam: Appears calm and comfortable Respiratory system: Clear to auscultation. Respiratory effort normal. Cardiovascular system: S1 & S2 heard, RRR. No murmurs, rubs, gallops or clicks. Gastrointestinal system: Abdomen is nondistended, soft and  nontender. No organomegaly or masses felt. Normal bowel sounds heard. Central nervous system: Alert and oriented to person, place month/year. No focal neurological deficits. Extremities: No edema. No calf tenderness Skin: No cyanosis. No rashes Psychiatry: Judgement and insight appear normal. Mood & affect appropriate.     Data Reviewed: I have personally reviewed following labs and imaging studies  CBC: Recent Labs  Lab 12/05/18 0210 12/06/18 2045 12/07/18 0348  WBC 8.4 7.6 6.9  NEUTROABS 3.6 3.5  --   HGB 12.4 12.8 11.5*  HCT 42.2 42.6 37.4  MCV 76.7* 75.3* 74.9*  PLT 269 292 643   Basic Metabolic Panel: Recent Labs  Lab 12/05/18 0210 12/06/18 2045 12/07/18 0348  NA 133* 135 137  K 4.0 3.6 3.6  CL 104 104 107  CO2 21* 21* 22  GLUCOSE 120* 105* 108*  BUN 7* 10 7*  CREATININE 0.80 0.81 0.71  CALCIUM 9.6 10.2 9.5   GFR: Estimated Creatinine Clearance: 65.6 mL/min (by C-G formula based on SCr of 0.71 mg/dL). Liver Function Tests: Recent Labs  Lab 12/05/18 0210 12/06/18 2045  AST 28 28  ALT 32 29  ALKPHOS 78 75  BILITOT 0.4 0.7  PROT 7.2 7.8  ALBUMIN 3.5 3.6   No results for input(s): LIPASE, AMYLASE in the last 168 hours. No results for input(s): AMMONIA in the last 168 hours. Coagulation Profile: Recent Labs  Lab 12/05/18 0210  INR 1.1   Cardiac Enzymes: No results for input(s): CKTOTAL, CKMB, CKMBINDEX, TROPONINI in the last 168 hours. BNP (  last 3 results) No results for input(s): PROBNP in the last 8760 hours. HbA1C: No results for input(s): HGBA1C in the last 72 hours. CBG: No results for input(s): GLUCAP in the last 168 hours. Lipid Profile: No results for input(s): CHOL, HDL, LDLCALC, TRIG, CHOLHDL, LDLDIRECT in the last 72 hours. Thyroid Function Tests: Recent Labs    12/07/18 0033  TSH 0.826   Anemia Panel: No results for input(s): VITAMINB12, FOLATE, FERRITIN, TIBC, IRON, RETICCTPCT in the last 72 hours. Sepsis Labs: No results for  input(s): PROCALCITON, LATICACIDVEN in the last 168 hours.  Recent Results (from the past 240 hour(s))  SARS CORONAVIRUS 2 Nasal Swab Aptima Multi Swab     Status: None   Collection Time: 12/06/18  8:59 PM   Specimen: Aptima Multi Swab; Nasal Swab  Result Value Ref Range Status   SARS Coronavirus 2 NEGATIVE NEGATIVE Final    Comment: (NOTE) SARS-CoV-2 target nucleic acids are NOT DETECTED. The SARS-CoV-2 RNA is generally detectable in upper and lower respiratory specimens during the acute phase of infection. Negative results do not preclude SARS-CoV-2 infection, do not rule out co-infections with other pathogens, and should not be used as the sole basis for treatment or other patient management decisions. Negative results must be combined with clinical observations, patient history, and epidemiological information. The expected result is Negative. Fact Sheet for Patients: SugarRoll.be Fact Sheet for Healthcare Providers: https://www.woods-mathews.com/ This test is not yet approved or cleared by the [place] FDA and  has been authorized for detection and/or diagnosis of SARS-CoV-2 by FDA under an Emergency Use Authorization (EUA). This EUA will remain  in effect (meaning this test can be used) for the duration of the COVID-19 declaration under Section 56 4(b)(1) of the Act, 21 U.S.C. section 360bbb-3(b)(1), unless the authorization is terminated or revoked sooner. Performed at [facility], [place] [address] [place], [place] [address]          Radiology Studies: No results found.      Scheduled Meds: . enoxaparin (LOVENOX) injection  40 mg Subcutaneous Q24H  . lithium carbonate  450 mg Oral BID  . OLANZapine  10 mg Oral QHS  . OLANZapine  20 mg Oral QHS   Continuous Infusions: . cefTRIAXone (ROCEPHIN)  IV Stopped (12/07/18 0035)     LOS: 0 days     Cordelia Poche, MD [facility] 12/07/2018, 2:39 PM  If  7PM-7AM, please contact night-coverage [email or web address]

## 2018-12-08 DIAGNOSIS — D509 Iron deficiency anemia, unspecified: Secondary | ICD-10-CM

## 2018-12-08 LAB — CBC WITH DIFFERENTIAL/PLATELET
Abs Immature Granulocytes: 0.02 10*3/uL (ref 0.00–0.07)
Basophils Absolute: 0 10*3/uL (ref 0.0–0.1)
Basophils Relative: 1 %
Eosinophils Absolute: 1.4 10*3/uL — ABNORMAL HIGH (ref 0.0–0.5)
Eosinophils Relative: 21 %
HCT: 37.3 % (ref 36.0–46.0)
Hemoglobin: 11.6 g/dL — ABNORMAL LOW (ref 12.0–15.0)
Immature Granulocytes: 0 %
Lymphocytes Relative: 32 %
Lymphs Abs: 2 10*3/uL (ref 0.7–4.0)
MCH: 23.4 pg — ABNORMAL LOW (ref 26.0–34.0)
MCHC: 31.1 g/dL (ref 30.0–36.0)
MCV: 75.2 fL — ABNORMAL LOW (ref 80.0–100.0)
Monocytes Absolute: 0.6 10*3/uL (ref 0.1–1.0)
Monocytes Relative: 9 %
Neutro Abs: 2.4 10*3/uL (ref 1.7–7.7)
Neutrophils Relative %: 37 %
Platelets: 266 10*3/uL (ref 150–400)
RBC: 4.96 MIL/uL (ref 3.87–5.11)
RDW: 16.9 % — ABNORMAL HIGH (ref 11.5–15.5)
WBC: 6.4 10*3/uL (ref 4.0–10.5)
nRBC: 0 % (ref 0.0–0.2)

## 2018-12-08 LAB — COMPREHENSIVE METABOLIC PANEL
ALT: 23 U/L (ref 0–44)
AST: 22 U/L (ref 15–41)
Albumin: 2.9 g/dL — ABNORMAL LOW (ref 3.5–5.0)
Alkaline Phosphatase: 66 U/L (ref 38–126)
Anion gap: 5 (ref 5–15)
BUN: 5 mg/dL — ABNORMAL LOW (ref 8–23)
CO2: 22 mmol/L (ref 22–32)
Calcium: 9.1 mg/dL (ref 8.9–10.3)
Chloride: 110 mmol/L (ref 98–111)
Creatinine, Ser: 0.75 mg/dL (ref 0.44–1.00)
GFR calc Af Amer: >60 mL/min (ref >60)
GFR calc non Af Amer: >60 mL/min (ref >60)
Glucose, Bld: 94 mg/dL (ref 70–99)
Potassium: 4.1 mmol/L (ref 3.5–5.1)
Sodium: 137 mmol/L (ref 135–145)
Total Bilirubin: 0.3 mg/dL (ref 0.3–1.2)
Total Protein: 6.5 g/dL (ref 6.5–8.1)

## 2018-12-08 LAB — URINE CULTURE: Culture: >=100000 — AB

## 2018-12-08 LAB — RAPID URINE DRUG SCREEN, HOSP PERFORMED
Amphetamines: NOT DETECTED
Barbiturates: NOT DETECTED
Benzodiazepines: NOT DETECTED
Cocaine: NOT DETECTED
Opiates: NOT DETECTED
Tetrahydrocannabinol: NOT DETECTED

## 2018-12-08 LAB — MAGNESIUM: Magnesium: 1.8 mg/dL (ref 1.7–2.4)

## 2018-12-08 LAB — AMMONIA: Ammonia: 12 umol/L (ref 9–35)

## 2018-12-08 LAB — PHOSPHORUS: Phosphorus: 3 mg/dL (ref 2.5–4.6)

## 2018-12-08 MED ORDER — CEPHALEXIN 500 MG PO CAPS
500.0000 mg | ORAL_CAPSULE | Freq: Three times a day (TID) | ORAL | Status: AC
  Administered 2018-12-08 – 2018-12-13 (×17): 500 mg via ORAL
  Filled 2018-12-08 (×17): qty 1

## 2018-12-08 MED ORDER — VALBENAZINE TOSYLATE 80 MG PO CAPS: 80.0000 | ORAL_CAPSULE | Freq: Every day | ORAL | Status: DC

## 2018-12-08 MED ORDER — CEPHALEXIN 500 MG PO CAPS: 500.0000 mg | ORAL_CAPSULE | Freq: Three times a day (TID) | ORAL | 0 refills | Status: AC

## 2018-12-08 NOTE — Progress Notes (Signed)
  Speech Language Pathology Treatment: Cognitive-Linquistic  Patient Details Name: Karen Best MRN: [id number] DOB: 1955-08-13 Today's Date: 12/08/2018 Time: 6314-9702 SLP Time Calculation (min) (ACUTE ONLY): 17 min  Assessment / Plan / Recommendation Clinical Impression  Pt was seen for cognitive-linguistic treatment. She was moderately cooperative during the session but required encouragement to participate fully. She frequently stated, "I don't know, I'm not doing that no more" when she perceived tasks to be difficult or when she demonstrated difficulty. The session was ultimately terminated prematurely per the patient's request. She achieved 33% accuracy with immediate recall of 4 items and 67% accuracy with 5-item immediate recall increasing to 100% with min-mod cues. She was unable to complete time management problems despite max support. She demonstrated 50% accuracy with abstract reasoning increasing to 100% with mod cues. She completed a medication management (prescription) task with 67% accuracy increasing to 100% with mod cues. SLP will continue to follow pt.    HPI HPI: Pt is a 63 y.o. female with medical history significant for schizophrenia and hepatitis C who presented to the ED for evaluation of altered mental status. Pt was initially seen in ED on 12/05/2018 due to reported slurred speech, although there was question about speech abnormality at her baseline. She had a CT head and MRI brain were negative for acute infarct and she was discharged home on that date. She returned to the ED on 12/06/18 secondary to "slow and garbled speech" and lethargy. Pt has been diagnosed with acute metabolic encephalopathy due to UTI.       SLP Plan  Continue with current plan of care  Patient needs continued Speech Lanaguage Pathology Services    Recommendations                   Follow up Recommendations: 24 hour supervision/assistance;Skilled Nursing facility SLP Visit Diagnosis:  Cognitive communication deficit (R41.841);Dysarthria and anarthria (R47.1) Plan: Continue with current plan of care       Clarissa Laird I. Hardin Negus, [place], [place] Office number [phone] Pager [place] 12/08/2018, 4:26 PM

## 2018-12-08 NOTE — Progress Notes (Signed)
PROGRESS NOTE    Karen Best  KZS:[id number] DOB: December 06, 1955 DOA: 12/06/2018 PCP: [facility]   Brief Narrative:  The patient is a 63 year old female with a past medical history significant for but not limited to schizophrenia, hepatitis C which was treated with Mavyret, and has other comorbidities who presented secondary to altered mental status and was found to have a E. coli UTI which was sensitive to cephalosporins.  Patient does have a allergy to penicillin but this is low risk and she has tolerated ceftriaxone well.  She improved her mental status but has significantly poor cognition and per her report leaves her stove on at home.  OT and PT evaluated and recommending a skilled nursing facility.  SLP did a cognitive evaluation and feels the patient has moderate dementia and recommends SLP services at a skilled nursing facility.  Patient also has some mild dysarthria and has slower speech.  Will consult social work for skilled nursing facility placement.  Psychiatry consulted for possible paranoid schizophrenia.  Assessment & Plan:   Principal Problem:   UTI (urinary tract infection) Active Problems:   Schizophrenia ([place])   Acute metabolic encephalopathy   E Coli UTI -UA done showed cloudy appearance with amber color urine, large leukocytes, many bacteria, 0-5 squamous epithelial cells, 6-10 WBCs, as well -Patient empirically started on Ceftriaxone -Urine culture done and showed E. coli greater than 100,000 colonies which was pansensitive and only resistant to Bactrim -Continued Ceftriaxone and transitioned to p.o. Keflex 500 g 3 times daily -PT/OT/SLP evaluation recommending skilled nursing facility currently  Acute metabolic encephalopathy superimposed on likely chronic dementia -In setting of infection. -Patient has poor cognition and patient's assisted living facility felt that she was unsafe to return there given that she lives a stove on -SLP  and OT evaluated and administered Short Blessed test for memory and cognition she scored a 13 out of 28 and also had a Montreal cognitive assessment 8.1 which she achieved a 13 of 30 -Patient is unsafe to return home and they are recommending skilled nursing facility -Head CT done without contrast because of her slurred speech showed atrophic changes without acute abnormality -MRI of the brain done without contrast showed no acute intracranial abnormality and normal aging of the brain -Likely patient has dementia and will need formal dementia work-up in the outpatient setting done by neurology -Spoke with neurology and given her continued slurred speech and decreased cognition Dr. Malen Gauze feels that this could be related to her schizophrenia -We will check a B12, RPR, HIV, ammonia level and repeat lithium level -Repeat UDS for now -Patient is stating that her PCP called her to notify of her current STD but no information was gathered so will call PCPs office to verify this; apparently she has a history of doing this and back in June she states that she had an STD and some was told her STD medications but in the interim we will obtain a GC chlamydia probe and RPR and HIV -We will get psychiatry to formally evaluate as this could be some psychosis and paranoia related to her schizophrenia -Continue to monitor and will need SNF at discharge likely  Schizophrenia -Lithium level on admission was within range and will repeat -Continue with olanzapine 30 mg nightly along with lithium carbonate 450 mg p.o. twice daily -Patient takes Valbenazine Tosylate 80 milligrams p.o. nightly for tardive dyskinesia and will resume -We will have psychiatry formally evaluate given her paranoia and question if she actually  has STD or not  Hepatitis C  -S/p Treatment with Mayvret  -Checked LFTs and they were within normal limits and ammonia level is pending given her AMS  Microcytic Anemia -The patient's  hemoglobin/hematocrit is now 11.6/37.3 -Check anemia panel in a.m. -Continue monitor for signs and symptoms of bleeding; currently no overt bleeding noted -Repeat CBC in a.m.  Questionable STD -Check a GC screen and RPR -Continue to monitor and will need to verify it with PCP -Patient claims the PCP called her saying that she had an STD  DVT prophylaxis: Enoxaparin 40 mg subcu every 24h Code Status: FULL CODE Family Communication: No family present at bedside  Disposition Plan:   Consultants:   Discussed with Neurology Dr. Rory Percy  Psychiatry    Procedures: None   Antimicrobials:  Anti-infectives (From admission, onward)   Start     Dose/Rate Route Frequency Ordered Stop   12/08/18 1400  cephALEXin (KEFLEX) capsule 500 mg     500 mg Oral Every 8 hours 12/08/18 1036     12/08/18 0000  cephALEXin (KEFLEX) 500 MG capsule     500 mg Oral Every 8 hours 12/08/18 1113 12/13/18 2359   12/06/18 2345  cefTRIAXone (ROCEPHIN) 1 g in sodium chloride 0.9 % 100 mL IVPB  Status:  Discontinued     1 g 200 mL/hr over 30 Minutes Intravenous Every 24 hours 12/06/18 2330 12/08/18 1036   12/06/18 2245  levofloxacin (LEVAQUIN) IVPB 500 mg  Status:  Discontinued     500 mg 100 mL/hr over 60 Minutes Intravenous  Once 12/06/18 2241 12/06/18 2330     Subjective: Seen and examined at bedside and she is slower to respond.  Denied chest pain, lightheadedness or dizziness but wanting to rest.  No other concerns or complaints at this time.  Objective: Vitals:   12/07/18 2300 12/08/18 0309 12/08/18 0852 12/08/18 1306  BP: 134/71 (!) 143/79 127/70 123/69  Pulse: (!) 55 (!) 56 62 (!) 55  Resp: 18 18 18 18   Temp: 97.6 F (36.4 C) 97.6 F (36.4 C) 98.2 F (36.8 C) 98.7 F (37.1 C)  TempSrc: Oral Oral Oral Oral  SpO2: 100% 100% 100% 100%  Weight:      Height:        Intake/Output Summary (Last 24 hours) at 12/08/2018 1618 Last data filed at 12/08/2018 1300 Gross per 24 hour  Intake 760 ml   Output 3600 ml  Net -2840 ml   Filed Weights   12/07/18 0029  Weight: 57 kg   Examination: Physical Exam:  Constitutional: Thin AAF in NAD and appears calm  Eyes: Lids and conjunctivae normal, sclerae anicteric  ENMT: External Ears, Nose appear normal. Grossly normal hearing.  Neck: Appears normal, supple, no cervical masses, normal ROM, no appreciable thyromegaly; no JVD Respiratory: Diminished to auscultation bilaterally, no wheezing, rales, rhonchi or crackles. Normal respiratory effort and patient is not tachypenic. No accessory muscle use.  Cardiovascular: RRR, no murmurs / rubs / gallops. S1 and S2 auscultated. No extremity edema.  Abdomen: Soft, non-tender, Distended. Bowel sounds positive x4.  GU: Deferred. Musculoskeletal: No clubbing / cyanosis of digits/nails. Normal strength and muscle tone.  Skin: No rashes, lesions, ulcers on a limited skin evaluation. No induration; Warm and dry.  Neurologic: CN 2-12 grossly intact with no focal deficits. Romberg sign cerebellar reflexes not assessed.  Psychiatric: Normal judgment and insight. Anxious mood and appropriate affect.   Data Reviewed: I have personally reviewed following labs and imaging studies  CBC:  Recent Labs  Lab 12/05/18 0210 12/06/18 2045 12/07/18 0348 12/08/18 0821  WBC 8.4 7.6 6.9 6.4  NEUTROABS 3.6 3.5  --  2.4  HGB 12.4 12.8 11.5* 11.6*  HCT 42.2 42.6 37.4 37.3  MCV 76.7* 75.3* 74.9* 75.2*  PLT 269 292 280 875   Basic Metabolic Panel: Recent Labs  Lab 12/05/18 0210 12/06/18 2045 12/07/18 0348 12/08/18 0821  NA 133* 135 137 137  K 4.0 3.6 3.6 4.1  CL 104 104 107 110  CO2 21* 21* 22 22  GLUCOSE 120* 105* 108* 94  BUN 7* 10 7* 5*  CREATININE 0.80 0.81 0.71 0.75  CALCIUM 9.6 10.2 9.5 9.1  MG  --   --   --  1.8  PHOS  --   --   --  3.0   GFR: Estimated Creatinine Clearance: 65.6 mL/min (by C-G formula based on SCr of 0.75 mg/dL). Liver Function Tests: Recent Labs  Lab 12/05/18 0210  12/06/18 2045 12/08/18 0821  AST 28 28 22   ALT 32 29 23  ALKPHOS 78 75 66  BILITOT 0.4 0.7 0.3  PROT 7.2 7.8 6.5  ALBUMIN 3.5 3.6 2.9*   No results for input(s): LIPASE, AMYLASE in the last 168 hours. No results for input(s): AMMONIA in the last 168 hours. Coagulation Profile: Recent Labs  Lab 12/05/18 0210  INR 1.1   Cardiac Enzymes: No results for input(s): CKTOTAL, CKMB, CKMBINDEX, TROPONINI in the last 168 hours. BNP (last 3 results) No results for input(s): PROBNP in the last 8760 hours. HbA1C: No results for input(s): HGBA1C in the last 72 hours. CBG: No results for input(s): GLUCAP in the last 168 hours. Lipid Profile: No results for input(s): CHOL, HDL, LDLCALC, TRIG, CHOLHDL, LDLDIRECT in the last 72 hours. Thyroid Function Tests: Recent Labs    12/07/18 0033  TSH 0.826   Anemia Panel: No results for input(s): VITAMINB12, FOLATE, FERRITIN, TIBC, IRON, RETICCTPCT in the last 72 hours. Sepsis Labs: No results for input(s): PROCALCITON, LATICACIDVEN in the last 168 hours.  Recent Results (from the past 240 hour(s))  SARS CORONAVIRUS 2 Nasal Swab Aptima Multi Swab     Status: None   Collection Time: 12/06/18  8:59 PM   Specimen: Aptima Multi Swab; Nasal Swab  Result Value Ref Range Status   SARS Coronavirus 2 NEGATIVE NEGATIVE Final    Comment: (NOTE) SARS-CoV-2 target nucleic acids are NOT DETECTED. The SARS-CoV-2 RNA is generally detectable in upper and lower respiratory specimens during the acute phase of infection. Negative results do not preclude SARS-CoV-2 infection, do not rule out co-infections with other pathogens, and should not be used as the sole basis for treatment or other patient management decisions. Negative results must be combined with clinical observations, patient history, and epidemiological information. The expected result is Negative. Fact Sheet for Patients: SugarRoll.be Fact Sheet for Healthcare  Providers: https://www.woods-mathews.com/ This test is not yet approved or cleared by the [place] FDA and  has been authorized for detection and/or diagnosis of SARS-CoV-2 by FDA under an Emergency Use Authorization (EUA). This EUA will remain  in effect (meaning this test can be used) for the duration of the COVID-19 declaration under Section 56 4(b)(1) of the Act, 21 U.S.C. section 360bbb-3(b)(1), unless the authorization is terminated or revoked sooner. Performed at [facility], [place] [address] [place], [place] [address]   Urine Culture     Status: Abnormal   Collection Time: 12/06/18  9:38 PM   Specimen: Urine, Random  Result Value Ref Range Status   Specimen Description URINE, RANDOM  Final   Special Requests   Final    NONE Performed at [facility], [address], [place], [place] [address]    Culture >=100,000 COLONIES/mL ESCHERICHIA COLI (A)  Final   Report Status 12/08/2018 FINAL  Final   Organism ID, Bacteria ESCHERICHIA COLI (A)  Final      Susceptibility   Escherichia coli - MIC*    AMPICILLIN >=32 RESISTANT Resistant     CEFAZOLIN <=4 SENSITIVE Sensitive     CEFTRIAXONE <=1 SENSITIVE Sensitive     CIPROFLOXACIN <=0.25 SENSITIVE Sensitive     GENTAMICIN <=1 SENSITIVE Sensitive     IMIPENEM <=0.25 SENSITIVE Sensitive     NITROFURANTOIN <=16 SENSITIVE Sensitive     TRIMETH/SULFA >=320 RESISTANT Resistant     AMPICILLIN/SULBACTAM 4 SENSITIVE Sensitive     PIP/TAZO <=4 SENSITIVE Sensitive     Extended ESBL NEGATIVE Sensitive     * >=100,000 COLONIES/mL ESCHERICHIA COLI    Radiology Studies: No results found.  Scheduled Meds: . cephALEXin  500 mg Oral Q8H  . enoxaparin (LOVENOX) injection  40 mg Subcutaneous Q24H  . lithium carbonate  450 mg Oral BID  . OLANZapine  10 mg Oral QHS  . OLANZapine  20 mg Oral QHS   Continuous Infusions:   LOS: 1 day   Kerney Elbe, DO [facility] PAGER is on [place]  If 7PM-7AM,  please contact night-coverage [email or web address] Password [facility] 12/08/2018, 4:18 PM

## 2018-12-08 NOTE — Evaluation (Signed)
Occupational Therapy Evaluation Patient Details Name: Karen Best MRN: [id number] DOB: 1956/03/06 Today's Date: 12/08/2018    History of Present Illness Pt is a 63 yo female s/p UTI with AMS and recent admit with AMS and negative CT head and MRI Brain, PMHx: afib, schizophrenia, bipolar d/o, hep C.   Clinical Impression   Pt PTA: Pt was living alone and has access of services through [employer]. Pt has left door open to apartment and stove on when baking. Pt reports being independent prior, but would not elaborate. Pt current limited by poor cognition, poor strength and poor ability to care for self. Pt given Short Blessed Test for memory and cognition scoring 13/28,  impairment consistent with dementia and would need further evaluation for dementia disorder. Pt with impairments in sequencing, memory and problem solving. Thus, pt is unsafe to live alone unsupervised at this time. Pt requires minA overall for ADL and minA overall for mobility with RW or handheldA.Pt would greatly benefit from continued OT skilled services for ADL, mobility and cognition. OT following acutely.      Follow Up Recommendations  SNF;Supervision/Assistance - 24 hour    Equipment Recommendations  None recommended by OT    Recommendations for Other Services       Precautions / Restrictions Precautions Precautions: Fall Restrictions Weight Bearing Restrictions: No      Mobility Bed Mobility Overal bed mobility: Needs Assistance Bed Mobility: Supine to Sit     Supine to sit: Min guard        Transfers Overall transfer level: Needs assistance Equipment used: Rolling walker (2 wheeled);1 person hand held assist Transfers: Sit to/from [facility] Sit to Stand: Min assist Stand pivot transfers: Min assist       General transfer comment: Pt unsafe in room without assist; pt impulsive and requires assist for stability with initial standing balance    Balance Overall balance  assessment: Needs assistance Sitting-balance support: Bilateral upper extremity supported;Feet supported Sitting balance-Leahy Scale: Good     Standing balance support: Single extremity supported Standing balance-Leahy Scale: Poor                             ADL either performed or assessed with clinical judgement   ADL Overall ADL's : Needs assistance/impaired Eating/Feeding: Modified independent;Sitting   Grooming: Min guard;Wash/dry hands;Wash/dry face;Oral care;Brushing hair;Standing Grooming Details (indicate cue type and reason): assist for standing balance Upper Body Bathing: Set up;Sitting   Lower Body Bathing: Minimal assistance;Sitting/lateral leans;Sit to/from stand   Upper Body Dressing : Set up;Sitting   Lower Body Dressing: Minimal assistance;Sitting/lateral leans;Sit to/from stand   Toilet Transfer: Minimal assistance;Cueing for safety;Cueing for sequencing;Stand-pivot;Grab bars Toilet Transfer Details (indicate cue type and reason): Cues to avoid premature sitting and use of grab bars Toileting- Clothing Manipulation and Hygiene: Minimal assistance;Sitting/lateral lean;Sit to/from stand;Cueing for safety;Cueing for sequencing       Functional mobility during ADLs: Min guard;Minimal assistance;Rolling walker;Cueing for safety;Cueing for sequencing General ADL Comments: Assist for safety and to reduce impulsivity for mobility and grabbing things from purse when seated in chair as recliner was beign made up.     Vision Baseline Vision/History: No visual deficits Vision Assessment?: No apparent visual deficits     Perception     Praxis      Pertinent Vitals/Pain Pain Assessment: No/denies pain     Hand Dominance Right   Extremity/Trunk Assessment Upper Extremity Assessment Upper Extremity Assessment: Generalized weakness  Lower Extremity Assessment Lower Extremity Assessment: Generalized weakness   Cervical / Trunk Assessment Cervical /  Trunk Assessment: Normal   Communication Communication Communication: Expressive difficulties   Cognition Arousal/Alertness: Awake/alert Behavior During Therapy: WFL for tasks assessed/performed Overall Cognitive Status: (Simultaneous filing. User may not have seen previous data.) Area of Impairment: Memory;Following commands;Safety/judgement;Awareness;Problem solving                     Memory: Decreased short-term memory Following Commands: Follows one step commands with increased time Safety/Judgement: Decreased awareness of safety;Decreased awareness of deficits   Problem Solving: Slow processing;Requires verbal cues General Comments: Requires cues to remember date as August instead of September.   General Comments  Pt given Short Blesses Test for memory and cognition scoring 13/28,  impairment consistent with dementia and would need further evaluation for dementia disorder. Pt with impairments in sequencing, memory and problem solving. Thus, pt is unsafe to live alone unsupervised at this time.    Exercises     Shoulder Instructions      Home Living Family/patient expects to be discharged to:: Private residence Living Arrangements: Alone Available Help at Discharge: Personal care attendant;Available PRN/intermittently([employer]) Type of Home: Apartment Home Access: Level entry     Home Layout: One level     Bathroom Shower/Tub: Teacher, early years/pre: Standard         Additional Comments: studio apartment  Lives With: Alone    Prior Functioning/Environment Level of Independence: Independent with assistive device(s)        Comments: Pt unable to answer question when asked about PTA function        OT Problem List: Decreased strength;Decreased activity tolerance;Impaired balance (sitting and/or standing);Decreased safety awareness;Decreased cognition      OT Treatment/Interventions: Self-care/ADL training;Therapeutic  exercise;Neuromuscular education;Energy conservation;Therapeutic activities;Patient/family education;Balance training;Cognitive remediation/compensation    OT Goals(Current goals can be found in the care plan section) Acute Rehab OT Goals Patient Stated Goal: to feel better OT Goal Formulation: With patient Time For Goal Achievement: 12/22/18 Potential to Achieve Goals: Good ADL Goals Pt Will Perform Grooming: with modified independence;sitting Pt Will Perform Upper Body Dressing: with modified independence;standing Pt Will Perform Lower Body Dressing: with modified independence;sitting/lateral leans;sit to/from stand Pt Will Transfer to Toilet: with modified independence;ambulating Additional ADL Goal #1: Pt will perform OOB ADL with modified independence with 1-2 verbal cues for safety.  OT Frequency: Min 2X/week   Barriers to D/C:            Co-evaluation              AM-PAC OT "6 Clicks" Daily Activity     Outcome Measure Help from another person eating meals?: None Help from another person taking care of personal grooming?: A Little Help from another person toileting, which includes using toliet, bedpan, or urinal?: A Little Help from another person bathing (including washing, rinsing, drying)?: A Lot Help from another person to put on and taking off regular upper body clothing?: A Little Help from another person to put on and taking off regular lower body clothing?: A Lot 6 Click Score: 17   End of Session Equipment Utilized During Treatment: Gait belt;Rolling walker Nurse Communication: Mobility status  Activity Tolerance: Patient tolerated treatment well Patient left: in chair;with call bell/phone within reach;with chair alarm set  OT Visit Diagnosis: Unsteadiness on feet (R26.81);Muscle weakness (generalized) (M62.81);Other symptoms and signs involving cognitive function  Time: 1412-1440 OT Time Calculation (min): 28 min Charges:  OT General  Charges $OT Visit: 1 Visit OT Evaluation $OT Eval Moderate Complexity: 1 Mod OT Treatments $Self Care/Home Management : 8-22 mins  Ebony Hail Harold Hedge) Marsa Aris OTR/L Acute Rehabilitation Services Pager: [phone] Office: [place] 12/08/2018, 3:36 PM

## 2018-12-08 NOTE — Progress Notes (Signed)
12/08/18 1832  PT Visit Information  Last PT Received On 12/08/18  Assistance Needed +1  History of Present Illness Pt is a 63 y/o female admitted secondary to AMS. Found to have UTI. PMH includes a fib, schizophrenia, bipolar disorder, and hep C.   Precautions  Precautions Fall  Restrictions  Weight Bearing Restrictions No  Home Living  Family/patient expects to be discharged to: Private residence  Living Arrangements Alone  Available Help at Discharge Personal care attendant;Available PRN/intermittently Consulting civil engineer company)  Type of Home Apartment  Home Access Level entry  Home Layout One level  Bathroom Shower/Tub Tub/shower unit  Automotive engineer None  Prior Function  Level of Independence Independent  Comments Pt reports she was independent with mobility.   Communication  Communication No difficulties  Pain Assessment  Pain Assessment No/denies pain  Cognition  Arousal/Alertness Awake/alert  Behavior During Therapy WFL for tasks assessed/performed  Overall Cognitive Status Impaired/Different from baseline  Area of Impairment Memory;Following commands;Safety/judgement;Problem solving  Memory Decreased short-term memory  Following Commands Follows one step commands with increased time  Safety/Judgement Decreased awareness of safety;Decreased awareness of deficits  Problem Solving Slow processing;Requires verbal cues  Upper Extremity Assessment  Upper Extremity Assessment Defer to OT evaluation  Lower Extremity Assessment  Lower Extremity Assessment Generalized weakness  Cervical / Trunk Assessment  Cervical / Trunk Assessment Normal  Bed Mobility  Overal bed mobility Needs Assistance  Bed Mobility Supine to Sit;Sit to Supine  Supine to sit Min guard  Sit to supine Min guard  General bed mobility comments Min guard for safety. Required safety cues to wait for PT to perform mobility tasks.   Transfers  Overall transfer level Needs assistance   Equipment used 1 person hand held assist  Transfers Sit to/from Stand  Sit to Stand Min assist  General transfer comment Min A for lift assist and steadying to stand.   Ambulation/Gait  Ambulation/Gait assistance Min assist  Gait Distance (Feet) 100 Feet  Assistive device 1 person hand held assist  Gait Pattern/deviations Step-through pattern;Decreased stride length;Drifts right/left  General Gait Details Slow, unsteady gait. Especially with dynamic gait tasks. Pt requiring increased time to follow cues during gait. Noted increased unsteadiness when performing horizontal and vertical head turns.   Gait velocity Decreased  Balance  Overall balance assessment Needs assistance  Sitting-balance support Bilateral upper extremity supported;Feet supported  Sitting balance-Leahy Scale Good  Standing balance support Single extremity supported  Standing balance-Leahy Scale Poor  Standing balance comment Reliant on UE and external support.   PT - End of Session  Equipment Utilized During Treatment Gait belt  Activity Tolerance Patient tolerated treatment well  Patient left in bed;with call bell/phone within reach;with bed alarm set  Nurse Communication Mobility status  PT Assessment  PT Recommendation/Assessment Patient needs continued PT services  PT Visit Diagnosis Unsteadiness on feet (R26.81);Muscle weakness (generalized) (M62.81)  PT Problem List Decreased strength;Decreased balance;Decreased mobility;Decreased cognition;Decreased knowledge of use of DME;Decreased safety awareness;Decreased knowledge of precautions  Barriers to Discharge Decreased caregiver support  PT Plan  PT Frequency (ACUTE ONLY) Min 2X/week  PT Treatment/Interventions (ACUTE ONLY) DME instruction;Gait training;Functional mobility training;Therapeutic activities;Therapeutic exercise;Balance training;Patient/family education;Cognitive remediation  AM-PAC PT "6 Clicks" Mobility Outcome Measure (Version 2)  Help needed  turning from your back to your side while in a flat bed without using bedrails? 3  Help needed moving from lying on your back to sitting on the side of a flat bed without using bedrails? 3  Help needed moving to and from a bed to a chair (including a wheelchair)? 3  Help needed standing up from a chair using your arms (e.g., wheelchair or bedside chair)? 3  Help needed to walk in hospital room? 3  Help needed climbing 3-5 steps with a railing?  2  6 Click Score 17  Consider Recommendation of Discharge To: Home with [facility]  PT Recommendation  Follow Up Recommendations SNF;Supervision/Assistance - 24 hour  PT equipment Other (comment) (TBD)  Individuals Consulted  Consulted and Agree with Results and Recommendations Patient  Acute Rehab PT Goals  Patient Stated Goal to feel better  PT Goal Formulation With patient  Time For Goal Achievement 12/22/18  Potential to Achieve Goals Good  PT Time Calculation  PT Start Time (ACUTE ONLY) 1650  PT Stop Time (ACUTE ONLY) 1703  PT Time Calculation (min) (ACUTE ONLY) 13 min  PT General Charges  $$ ACUTE PT VISIT 1 Visit  PT Evaluation  $PT Eval Low Complexity 1 Low  Written Expression  Dominant Hand Right   Pt admitted secondary to problem above with deficits above. Pt requiring min A for steadying throughout mobility tasks. Noted increased unsteadiness with dynamic gait tasks. Pt also presenting with cognitive deficits throughout session. Pt currently lives alone and feel she is at increased risk for falls. Will continue to follow acutely to maximize functional mobility independence and safety.   Leighton Ruff, PT, DPT  Acute Rehabilitation Services  Pager: [phone] Office: [phone]

## 2018-12-08 NOTE — Evaluation (Signed)
Speech Language Pathology Evaluation Patient Details Name: Karen Best MRN: [id number] DOB: August 23, 1955 Today's Date: 12/08/2018 Time: 6010-9323 SLP Time Calculation (min) (ACUTE ONLY): 23 min  Problem List:  Patient Active Problem List   Diagnosis Date Noted  . UTI (urinary tract infection) 12/06/2018  . Acute metabolic encephalopathy 55/73/2202  . 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 Psychosis ([place]) 09/16/2013  . Schizophrenia [facility])    Past Medical History:  Past Medical History:  Diagnosis Date  . Asthma   . Bipolar 1 disorder ([place])   . Fibromyalgia   . Schizophrenia ([place])   . Tobacco abuse    Past Surgical History:  Past Surgical History:  Procedure Laterality Date  . CESAREAN SECTION    . TUBAL LIGATION     HPI:  Pt is a 63 y.o. female with medical history significant for schizophrenia and hepatitis C who presented to the ED for evaluation of altered mental status. Pt was initially seen in ED on 12/05/2018 due to reported slurred speech, although there was question about speech abnormality at her baseline. She had a CT head and MRI brain were negative for acute infarct and she was discharged home on that date. She returned to the ED on 12/06/18 secondary to "slow and garbled speech" and lethargy. Pt has been diagnosed with acute metabolic encephalopathy due to UTI.    Assessment / Plan / Recommendation Clinical Impression  Pt reported that she was living independently prior to admission and managed her own medications but has a "payee" to pay her bills. Pt reported that since last week she has been falling and hallucinating. She was unsure as to how long her speech has been imprecise but stated that it is not her baseline.   The [facility] Cognitive Assessment 8.1 was completed to evaluate the pt's cognitive-linguistic skills. She  achieved a score of 13/30 which is below the normal limits of 26 or more out of 30 and is suggestive of a moderate impairment. She  demonstrated deficits in the areas of awareness, executive function, problem solving, attention, mental manipulation, divergent naming, abstract reasoning, orientation, and memory. Mild to moderate dysarthria was also noted characterized by reduced articulatory precision and reduced vocal intensity which negatively impacted speech intelligibility during conversation. Skilled SLP services are clinically indicated at this time to improve cognitive-linguistic skills and dysarthria. Pt and nursing were educated regarding results and recommendations; both parties verbalized understanding as well as agreement with plan of care.    SLP Assessment  SLP Recommendation/Assessment: Patient needs continued Speech Lanaguage Pathology Services SLP Visit Diagnosis: Cognitive communication deficit (R41.841);Dysarthria and anarthria (R47.1)    Follow Up Recommendations  24 hour supervision/assistance;Skilled Nursing facility    Frequency and Duration min 2x/week  2 weeks      SLP Evaluation Cognition  Overall Cognitive Status: Impaired/Different from baseline Arousal/Alertness: Awake/alert Orientation Level: Oriented to person;Oriented to place Attention: Focused;Sustained Focused Attention: Impaired Focused Attention Impairment: Verbal complex(Vigilance impaired: 0/1) Sustained Attention: Impaired Sustained Attention Impairment: Verbal complex(Serial 7s: 1/3) Memory: Impaired Memory Impairment: Retrieval deficit;Storage deficit;Decreased recall of new information(Immediate: 3/5; delayed: 0/5 with cues: 4/5) Awareness: Impaired Awareness Impairment: Emergent impairment Problem Solving: Impaired Problem Solving Impairment: Verbal complex Executive Function: Reasoning;Sequencing;Organizing Reasoning: Impaired Reasoning Impairment: Verbal complex(Abstraction: 0/2) Sequencing: Impaired Sequencing Impairment: Verbal complex(Clock drawing: 1/3) Organizing: Appears intact(Backward digit span: 1/1)        Comprehension  Auditory Comprehension Overall Auditory Comprehension: Appears within functional limits for tasks assessed Yes/No Questions: Within Functional Limits Commands: Impaired  Complex Commands: (Trail completion: 0/1) Conversation: Complex Reading Comprehension Reading Status: Within funtional limits    Expression Expression Primary Mode of Expression: Verbal Verbal Expression Overall Verbal Expression: Appears within functional limits for tasks assessed Initiation: No impairment Level of Generative/Spontaneous Verbalization: Conversation Repetition: (1/2) Confrontation: Within functional limits(3/3) Divergent: (0/2) Written Expression Dominant Hand: Right   Oral / Motor  Oral Motor/Sensory Function Overall Oral Motor/Sensory Function: Within functional limits Motor Speech Overall Motor Speech: Impaired Respiration: Within functional limits Phonation: Normal Resonance: Within functional limits Articulation: Impaired Level of Impairment: Conversation Intelligibility: Intelligibility reduced Word: 75-100% accurate Phrase: 75-100% accurate Sentence: 50-74% accurate Conversation: 50-74% accurate Motor Planning: Witnin functional limits Motor Speech Errors: Aware;Consistent   Dustie Brittle I. Hardin Negus, [place], [place] Office number [phone] Pager [place] 12/08/2018, 4:18 PM

## 2018-12-09 DIAGNOSIS — R4182 Altered mental status, unspecified: Secondary | ICD-10-CM

## 2018-12-09 DIAGNOSIS — F29 Unspecified psychosis not due to a substance or known physiological condition: Secondary | ICD-10-CM

## 2018-12-09 DIAGNOSIS — F05 Delirium due to known physiological condition: Secondary | ICD-10-CM

## 2018-12-09 DIAGNOSIS — D649 Anemia, unspecified: Secondary | ICD-10-CM

## 2018-12-09 LAB — IRON AND TIBC
Iron: 38 ug/dL (ref 28–170)
Saturation Ratios: 12 % (ref 10.4–31.8)
TIBC: 305 ug/dL (ref 250–450)
UIBC: 267 ug/dL

## 2018-12-09 LAB — COMPREHENSIVE METABOLIC PANEL
ALT: 26 U/L (ref 0–44)
AST: 30 U/L (ref 15–41)
Albumin: 2.9 g/dL — ABNORMAL LOW (ref 3.5–5.0)
Alkaline Phosphatase: 60 U/L (ref 38–126)
Anion gap: 6 (ref 5–15)
BUN: 6 mg/dL — ABNORMAL LOW (ref 8–23)
CO2: 25 mmol/L (ref 22–32)
Calcium: 9.3 mg/dL (ref 8.9–10.3)
Chloride: 107 mmol/L (ref 98–111)
Creatinine, Ser: 0.84 mg/dL (ref 0.44–1.00)
GFR calc Af Amer: >60 mL/min (ref >60)
GFR calc non Af Amer: >60 mL/min (ref >60)
Glucose, Bld: 91 mg/dL (ref 70–99)
Potassium: 3.9 mmol/L (ref 3.5–5.1)
Sodium: 138 mmol/L (ref 135–145)
Total Bilirubin: 0.2 mg/dL — ABNORMAL LOW (ref 0.3–1.2)
Total Protein: 6.4 g/dL — ABNORMAL LOW (ref 6.5–8.1)

## 2018-12-09 LAB — CBC WITH DIFFERENTIAL/PLATELET
Abs Immature Granulocytes: 0.02 10*3/uL (ref 0.00–0.07)
Basophils Absolute: 0 10*3/uL (ref 0.0–0.1)
Basophils Relative: 0 %
Eosinophils Absolute: 1.1 10*3/uL — ABNORMAL HIGH (ref 0.0–0.5)
Eosinophils Relative: 18 %
HCT: 38.2 % (ref 36.0–46.0)
Hemoglobin: 11.4 g/dL — ABNORMAL LOW (ref 12.0–15.0)
Immature Granulocytes: 0 %
Lymphocytes Relative: 38 %
Lymphs Abs: 2.4 10*3/uL (ref 0.7–4.0)
MCH: 23 pg — ABNORMAL LOW (ref 26.0–34.0)
MCHC: 29.8 g/dL — ABNORMAL LOW (ref 30.0–36.0)
MCV: 77 fL — ABNORMAL LOW (ref 80.0–100.0)
Monocytes Absolute: 0.5 10*3/uL (ref 0.1–1.0)
Monocytes Relative: 8 %
Neutro Abs: 2.3 10*3/uL (ref 1.7–7.7)
Neutrophils Relative %: 36 %
Platelets: 284 10*3/uL (ref 150–400)
RBC: 4.96 MIL/uL (ref 3.87–5.11)
RDW: 17 % — ABNORMAL HIGH (ref 11.5–15.5)
WBC: 6.2 10*3/uL (ref 4.0–10.5)
nRBC: 0 % (ref 0.0–0.2)

## 2018-12-09 LAB — HIV ANTIBODY (ROUTINE TESTING W REFLEX): HIV Screen 4th Generation wRfx: NONREACTIVE

## 2018-12-09 LAB — MAGNESIUM: Magnesium: 1.9 mg/dL (ref 1.7–2.4)

## 2018-12-09 LAB — RETICULOCYTES
Immature Retic Fract: 2.5 % (ref 2.3–15.9)
RBC.: 4.96 MIL/uL (ref 3.87–5.11)
Retic Count, Absolute: 55.6 10*3/uL (ref 19.0–186.0)
Retic Ct Pct: 1.1 % (ref 0.4–3.1)

## 2018-12-09 LAB — PHOSPHORUS: Phosphorus: 3.5 mg/dL (ref 2.5–4.6)

## 2018-12-09 LAB — RPR: RPR Ser Ql: NONREACTIVE

## 2018-12-09 LAB — FERRITIN: Ferritin: 90 ng/mL (ref 11–307)

## 2018-12-09 LAB — FOLATE: Folate: 10.2 ng/mL (ref >5.9)

## 2018-12-09 LAB — VITAMIN B12: Vitamin B-12: 617 pg/mL (ref 180–914)

## 2018-12-09 NOTE — NC FL2 (Signed)
[place] LEVEL OF CARE SCREENING TOOL     IDENTIFICATION  Patient Name: Karen Best Birthdate: August 17, 1955 Sex: female Admission Date (Current Location): 12/06/2018  [facility] and [place] Number:  Herbalist and Address:  [facility], [place] [address] [place], [place] [address]      Provider Number: [id number]  Attending Physician Name and Address:  Kerney Elbe, DO  Relative Name and Phone Number:       Current Level of Care: Hospital Recommended Level of Care: [place] Prior Approval Number:    Date Approved/Denied:   PASRR Number:    Discharge Plan: SNF    Current Diagnoses: Patient Active Problem List   Diagnosis Date Noted  . Delirium due to another medical condition   . UTI (urinary tract infection) 12/06/2018  . Acute metabolic encephalopathy 44/04/270  . 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 Psychosis ([place]) 09/16/2013  . Schizophrenia ([place])     Orientation RESPIRATION BLADDER Height & Weight     Self, Place  Normal Incontinent Weight: 57 kg Height:  5\' 5"  (165.1 cm)  BEHAVIORAL SYMPTOMS/MOOD NEUROLOGICAL BOWEL NUTRITION STATUS      Incontinent Diet(Regular)  AMBULATORY STATUS COMMUNICATION OF NEEDS Skin   Limited Assist Verbally Normal                       Personal Care Assistance Level of Assistance  Bathing, Dressing, Feeding Bathing Assistance: Limited assistance Feeding assistance: Independent Dressing Assistance: Limited assistance     Functional Limitations Info  Sight, Hearing, Speech Sight Info: Adequate Hearing Info: Adequate Speech Info: Impaired    SPECIAL CARE FACTORS FREQUENCY  PT (By licensed PT), OT (By licensed OT), Speech therapy     PT Frequency: 5x/wk OT Frequency: 5x/wk     Speech Therapy Frequency: 5x/wk      Contractures Contractures Info: Not present    Additional Factors Info  Code Status, Allergies, Psychotropic  Code Status Info: Full Allergies Info: Penicillin Psychotropic Info: Lithium 450 mg twice a day/ zyprexa 30 mg at bedtime/ Valbenazine Tosylate 1 tab at bedtime         Current Medications (12/09/2018):  This is the current hospital active medication list Current Facility-Administered Medications  Medication Dose Route Frequency Provider Last Rate Last Dose  . acetaminophen (TYLENOL) tablet 650 mg  650 mg Oral Q6H PRN Lenore Cordia, MD   650 mg at 12/09/18 1243   Or  . acetaminophen (TYLENOL) suppository 650 mg  650 mg Rectal Q6H PRN Zada Finders R, MD      . cephALEXin (KEFLEX) capsule 500 mg  500 mg Oral Q8H Sheikh, Omair [place], DO   500 mg at 12/09/18 1319  . enoxaparin (LOVENOX) injection 40 mg  40 mg Subcutaneous Q24H Lenore Cordia, MD   40 mg at 12/09/18 5366  . lithium carbonate (ESKALITH) CR tablet 450 mg  450 mg Oral BID Lenore Cordia, MD   450 mg at 12/09/18 0944  . OLANZapine (ZYPREXA) tablet 10 mg  10 mg Oral QHS Lenore Cordia, MD   10 mg at 12/08/18 2224  . OLANZapine (ZYPREXA) tablet 20 mg  20 mg Oral QHS Zada Finders R, MD   20 mg at 12/08/18 2224  . Valbenazine Tosylate CAPS 1 tablet  1 tablet Oral QHS Raiford Noble [place], DO         Discharge Medications: Please see discharge summary for  a list of discharge medications.  Relevant Imaging Results:  Relevant Lab Results:   Additional Information SS#: [id number]  Pollie Friar, RN

## 2018-12-09 NOTE — Consult Note (Signed)
Telepsych Consultation   Reason for Consult:  "Schizophrenia, Psychosis and some Paranoia" Referring Physician:  Dr. Raiford Noble Location of Patient: [facility] Location of Provider: [facility]  Patient Identification: Karen Best MRN:  [id number] Principal Diagnosis: Delirium due to another medical condition Diagnosis:  Principal Problem:   UTI (urinary tract infection) Active Problems:   Schizophrenia ([place])   Acute metabolic encephalopathy   Total Time spent with patient: 1 hour  Subjective:   Karen Best is a 63 y.o. female patient admitted with altered mental status.  HPI:   Per chart review, patient was admitted with altered mental status and found to have a UTI. She has significantly poor cognition and reports leaving her stove on at home. PT/OT recommend SNF placement. Psychiatry is consulted for concern for paranoid schizophrenia. Home medications include Zyprexa 30 mg qhs and Lithium 450 mg BID and Ingrezza 80 mg qhs.   On interview, Karen Best reports feeling better today. She endorses prior symptoms of UTI including dysuria and urgency. Her mood has been stable. She denies SI, HI or AVH. She reports compliance with her medications.   Past Psychiatric History: Schizophrenia, bipolar disorder and cocaine abuse.   Risk to Self:  None. Denies SI.  Risk to Others:  None. Denies HI. Prior Inpatient Therapy:  She was last hospitalized several years ago.  Prior Outpatient Therapy:  [facility]  Past Medical History:  Past Medical History:  Diagnosis Date  . Asthma   . Bipolar 1 disorder ([place])   . Fibromyalgia   . Schizophrenia ([place])   . Tobacco abuse     Past Surgical History:  Procedure Laterality Date  . CESAREAN SECTION    . TUBAL LIGATION     Family History:  Family History  Problem Relation Age of Onset  . Liver cancer Neg Hx   . Liver disease Neg Hx    Family Psychiatric  History: Denies  Social History:  Social History   Substance  and Sexual Activity  Alcohol Use Yes   Comment: occ     Social History   Substance and Sexual Activity  Drug Use Yes  . Types: Cocaine   Comment: crack cocaine (last use 10/25/2018)    Social History   Socioeconomic History  . Marital status: Legally Separated    Spouse name: Not on file  . Number of children: Not on file  . Years of education: Not on file  . Highest education level: Not on file  Occupational History  . Not on file  Social Needs  . Financial resource strain: Not on file  . Food insecurity    Worry: Not on file    Inability: Not on file  . Transportation needs    Medical: Not on file    Non-medical: Not on file  Tobacco Use  . Smoking status: Current Every Day Smoker    Packs/day: 0.50    Types: Cigarettes  . Smokeless tobacco: Never Used  Substance and Sexual Activity  . Alcohol use: Yes    Comment: occ  . Drug use: Yes    Types: Cocaine    Comment: crack cocaine (last use 10/25/2018)  . Sexual activity: Yes    Birth control/protection: None  Lifestyle  . Physical activity    Days per week: Not on file    Minutes per session: Not on file  . Stress: Not on file  Relationships  . Social connections    Talks on phone: Not on file  Gets together: Not on file    Attends religious service: Not on file    Active member of club or organization: Not on file    Attends meetings of clubs or organizations: Not on file    Relationship status: Not on file  Other Topics Concern  . Not on file  Social History Narrative  . Not on file   Additional Social History: She lives alone. She is a widow. She has 2 adult children. She denies illicit substance use or alcohol use.     Allergies:   Allergies  Allergen Reactions  . Penicillins Other (See Comments)    Child hood reaction Has patient had a PCN reaction causing immediate rash, facial/tongue/throat swelling, SOB or lightheadedness with hypotension: Yes Has patient had a PCN reaction causing severe  rash involving mucus membranes or skin necrosis: No Has patient had a PCN reaction that required hospitalization No Has patient had a PCN reaction occurring within the last 10 years: No If all of the above answers are "NO", then may proceed with C    Labs:  Results for orders placed or performed during the hospital encounter of 12/06/18 (from the past 48 hour(s))  CBC with Differential/Platelet     Status: Abnormal   Collection Time: 12/08/18  8:21 AM  Result Value Ref Range   WBC 6.4 4.0 - 10.5 K/uL   RBC 4.96 3.87 - 5.11 MIL/uL   Hemoglobin 11.6 (L) 12.0 - 15.0 g/dL   HCT 37.3 36.0 - 46.0 %   MCV 75.2 (L) 80.0 - 100.0 fL   MCH 23.4 (L) 26.0 - 34.0 pg   MCHC 31.1 30.0 - 36.0 g/dL   RDW 16.9 (H) 11.5 - 15.5 %   Platelets 266 150 - 400 K/uL   nRBC 0.0 0.0 - 0.2 %   Neutrophils Relative % 37 %   Neutro Abs 2.4 1.7 - 7.7 K/uL   Lymphocytes Relative 32 %   Lymphs Abs 2.0 0.7 - 4.0 K/uL   Monocytes Relative 9 %   Monocytes Absolute 0.6 0.1 - 1.0 K/uL   Eosinophils Relative 21 %   Eosinophils Absolute 1.4 (H) 0.0 - 0.5 K/uL   Basophils Relative 1 %   Basophils Absolute 0.0 0.0 - 0.1 K/uL   Immature Granulocytes 0 %   Abs Immature Granulocytes 0.02 0.00 - 0.07 K/uL    Comment: Performed at [facility], [address] [place], [place] [address]  Comprehensive metabolic panel     Status: Abnormal   Collection Time: 12/08/18  8:21 AM  Result Value Ref Range   Sodium 137 135 - 145 mmol/L   Potassium 4.1 3.5 - 5.1 mmol/L   Chloride 110 98 - 111 mmol/L   CO2 22 22 - 32 mmol/L   Glucose, Bld 94 70 - 99 mg/dL   BUN 5 (L) 8 - 23 mg/dL   Creatinine, Ser 0.75 0.44 - 1.00 mg/dL   Calcium 9.1 8.9 - 10.3 mg/dL   Total Protein 6.5 6.5 - 8.1 g/dL   Albumin 2.9 (L) 3.5 - 5.0 g/dL   AST 22 15 - 41 U/L   ALT 23 0 - 44 U/L   Alkaline Phosphatase 66 38 - 126 U/L   Total Bilirubin 0.3 0.3 - 1.2 mg/dL   GFR calc non Af Amer >60 >60 mL/min   GFR calc Af Amer >60 >60 mL/min   Anion gap 5  5 - 15    Comment: Performed at [facility], [address], [place], [place] [address]  Magnesium     Status: None   Collection Time: 12/08/18  8:21 AM  Result Value Ref Range   Magnesium 1.8 1.7 - 2.4 mg/dL    Comment: Performed at [facility] [address] [place], [place] [address]  Phosphorus     Status: None   Collection Time: 12/08/18  8:21 AM  Result Value Ref Range   Phosphorus 3.0 2.5 - 4.6 mg/dL    Comment: Performed at [facility] [address], [place], [place] [address]  RPR     Status: None   Collection Time: 12/08/18  4:50 PM  Result Value Ref Range   RPR Ser Ql Non Reactive Non Reactive    Comment: (NOTE) Performed At: [facility] [address] [place], [place] [id number] Rush Farmer MD [id number]   HIV Antibody (routine testing w rflx)     Status: None   Collection Time: 12/08/18  4:50 PM  Result Value Ref Range   HIV Screen 4th Generation wRfx Non Reactive Non Reactive    Comment: (NOTE) Performed At: [facility] [place], [place] [id number] Rush Farmer MD [id number]   Ammonia     Status: None   Collection Time: 12/08/18  4:50 PM  Result Value Ref Range   Ammonia 12 9 - 35 umol/L    Comment: Performed at [facility], [place] [address] [place], [place] [address]  Urine rapid drug screen (hosp performed)     Status: None   Collection Time: 12/08/18  5:20 PM  Result Value Ref Range   Opiates NONE DETECTED NONE DETECTED   Cocaine NONE DETECTED NONE DETECTED   Benzodiazepines NONE DETECTED NONE DETECTED   Amphetamines NONE DETECTED NONE DETECTED   Tetrahydrocannabinol NONE DETECTED NONE DETECTED   Barbiturates NONE DETECTED NONE DETECTED    Comment: (NOTE) DRUG SCREEN FOR MEDICAL PURPOSES ONLY.  IF CONFIRMATION IS NEEDED FOR ANY PURPOSE, NOTIFY LAB WITHIN 5 DAYS. LOWEST DETECTABLE LIMITS FOR URINE DRUG SCREEN Drug Class                     Cutoff (ng/mL) Amphetamine and  metabolites    1000 Barbiturate and metabolites    200 Benzodiazepine                 314 Tricyclics and metabolites     300 Opiates and metabolites        300 Cocaine and metabolites        300 THC                            50 Performed at [facility], [place] [address] [place], [place] [address]   Vitamin B12     Status: None   Collection Time: 12/09/18  4:33 AM  Result Value Ref Range   Vitamin B-12 617 180 - 914 pg/mL    Comment: (NOTE) This assay is not validated for testing neonatal or myeloproliferative syndrome specimens for Vitamin B12 levels. Performed at [facility], [place] [address], [place], [place] [address]   Iron and TIBC     Status: None   Collection Time: 12/09/18  4:33 AM  Result Value Ref Range   Iron 38 28 - 170 ug/dL   TIBC 305 250 - 450 ug/dL   Saturation Ratios 12 10.4 - 31.8 %  UIBC 267 ug/dL    Comment: Performed at [facility], [place] [address], [place], [place] [address]  Ferritin     Status: None   Collection Time: 12/09/18  4:33 AM  Result Value Ref Range   Ferritin 90 11 - 307 ng/mL    Comment: Performed at [facility], [place] [address], [place], [place] [address]  Reticulocytes     Status: None   Collection Time: 12/09/18  4:33 AM  Result Value Ref Range   Retic Ct Pct 1.1 0.4 - 3.1 %   RBC. 4.96 3.87 - 5.11 MIL/uL   Retic Count, Absolute 55.6 19.0 - 186.0 K/uL   Immature Retic Fract 2.5 2.3 - 15.9 %    Comment: Performed at [facility] [address] [place], [place] [address]  Magnesium     Status: None   Collection Time: 12/09/18  4:33 AM  Result Value Ref Range   Magnesium 1.9 1.7 - 2.4 mg/dL    Comment: Performed at [facility] [address], [place], [place] [address]  Phosphorus     Status: None   Collection Time: 12/09/18  4:33 AM  Result Value Ref Range   Phosphorus 3.5 2.5 - 4.6 mg/dL    Comment: Performed at [facility] [address] [place], [place] [address]  CBC with  Differential/Platelet     Status: Abnormal   Collection Time: 12/09/18  4:33 AM  Result Value Ref Range   WBC 6.2 4.0 - 10.5 K/uL   RBC 4.96 3.87 - 5.11 MIL/uL   Hemoglobin 11.4 (L) 12.0 - 15.0 g/dL   HCT 38.2 36.0 - 46.0 %   MCV 77.0 (L) 80.0 - 100.0 fL   MCH 23.0 (L) 26.0 - 34.0 pg   MCHC 29.8 (L) 30.0 - 36.0 g/dL   RDW 17.0 (H) 11.5 - 15.5 %   Platelets 284 150 - 400 K/uL   nRBC 0.0 0.0 - 0.2 %   Neutrophils Relative % 36 %   Neutro Abs 2.3 1.7 - 7.7 K/uL   Lymphocytes Relative 38 %   Lymphs Abs 2.4 0.7 - 4.0 K/uL   Monocytes Relative 8 %   Monocytes Absolute 0.5 0.1 - 1.0 K/uL   Eosinophils Relative 18 %   Eosinophils Absolute 1.1 (H) 0.0 - 0.5 K/uL   Basophils Relative 0 %   Basophils Absolute 0.0 0.0 - 0.1 K/uL   Immature Granulocytes 0 %   Abs Immature Granulocytes 0.02 0.00 - 0.07 K/uL    Comment: Performed at [facility], [place] [address], [place], [place] [address]  Comprehensive metabolic panel     Status: Abnormal   Collection Time: 12/09/18  4:33 AM  Result Value Ref Range   Sodium 138 135 - 145 mmol/L   Potassium 3.9 3.5 - 5.1 mmol/L   Chloride 107 98 - 111 mmol/L   CO2 25 22 - 32 mmol/L   Glucose, Bld 91 70 - 99 mg/dL   BUN 6 (L) 8 - 23 mg/dL   Creatinine, Ser 0.84 0.44 - 1.00 mg/dL   Calcium 9.3 8.9 - 10.3 mg/dL   Total Protein 6.4 (L) 6.5 - 8.1 g/dL   Albumin 2.9 (L) 3.5 - 5.0 g/dL   AST 30 15 - 41 U/L   ALT 26 0 - 44 U/L   Alkaline Phosphatase 60 38 - 126 U/L   Total Bilirubin 0.2 (L) 0.3 - 1.2 mg/dL   GFR  calc non Af Amer >60 >60 mL/min   GFR calc Af Amer >60 >60 mL/min   Anion gap 6 5 - 15    Comment: Performed at [facility] [address] [place], [place] [address]  Folate     Status: None   Collection Time: 12/09/18  4:40 AM  Result Value Ref Range   Folate 10.2 >5.9 ng/mL    Comment: Performed at [facility] [address], [place], [place] [address]    Medications:  Current Facility-Administered Medications   Medication Dose Route Frequency Provider Last Rate Last Dose  . acetaminophen (TYLENOL) tablet 650 mg  650 mg Oral Q6H PRN Lenore Cordia, MD   650 mg at 12/08/18 2005   Or  . acetaminophen (TYLENOL) suppository 650 mg  650 mg Rectal Q6H PRN Lenore Cordia, MD      . cephALEXin (KEFLEX) capsule 500 mg  500 mg Oral [address], Omair Waverly, DO   500 mg at 12/09/18 9470  . enoxaparin (LOVENOX) injection 40 mg  40 mg Subcutaneous Q24H Lenore Cordia, MD   40 mg at 12/09/18 9628  . lithium carbonate (ESKALITH) CR tablet 450 mg  450 mg Oral BID Lenore Cordia, MD   450 mg at 12/09/18 0944  . OLANZapine (ZYPREXA) tablet 10 mg  10 mg Oral QHS Lenore Cordia, MD   10 mg at 12/08/18 2224  . OLANZapine (ZYPREXA) tablet 20 mg  20 mg Oral QHS Zada Finders R, MD   20 mg at 12/08/18 2224  . Valbenazine Tosylate CAPS 1 tablet  1 tablet Oral QHS Sheikh, Bertram Savin, DO        Musculoskeletal: Strength & Muscle Tone: No atrophy noted. Gait & Station: UTA since patient is lying in bed. Patient leans: N/A  Psychiatric Specialty Exam: Physical Exam  Nursing note and vitals reviewed. Constitutional: She is oriented to person, place, and time. She appears well-developed and well-nourished.  HENT:  Head: Normocephalic and atraumatic.  Neck: Normal range of motion.  Respiratory: Effort normal.  Musculoskeletal: Normal range of motion.  Neurological: She is alert and oriented to person, place, and time.  Psychiatric: She has a normal mood and affect. Her speech is normal and behavior is normal. Judgment and thought content normal. Cognition and memory are normal.    Review of Systems  Genitourinary: Negative for dysuria and urgency.  Psychiatric/Behavioral: Negative for depression, hallucinations, substance abuse and suicidal ideas.  All other systems reviewed and are negative.   Blood pressure 123/73, pulse (!) 53, temperature 98.7 F (37.1 C), temperature source Oral, resp. rate 18, height 5\' 5"   (1.651 m), weight 57 kg, SpO2 91 %.Body mass index is 20.91 kg/m.  General Appearance: Fairly Groomed  Eye Contact:  Good  Speech:  Clear and Coherent and Normal Rate  Volume:  Normal  Mood:  Euthymic  Affect:  Appropriate and Congruent  Thought Process:  Goal Directed, Linear and Descriptions of Associations: Intact  Orientation:  Full (Time, Place, and Person)  Thought Content:  Logical  Suicidal Thoughts:  No  Homicidal Thoughts:  No  Memory:  Immediate;   Good Recent;   Good Remote;   Good  Judgement:  Fair  Insight:  Fair  Psychomotor Activity:  Normal  Concentration:  Concentration: Good and Attention Span: Good  Recall:  Good  Fund of Knowledge:  Good  Language:  Good  Akathisia:  No  Handed:  Right  AIMS (if indicated):  Patient has abnormal involuntary movements of the perioral area. She appears to be edentulous.   Assets:  Communication Skills Desire for Improvement Housing Resilience Social Support  ADL's:  Impaired  Cognition:  WNL  Sleep:   N/A   Assessment:  Karen Best is a 63 y.o. female who was admitted with altered mental status and found to have a UTI. She is clear in mental status and appropriate in behavior today. She denies SI, HI or AVH. She does not appear to be responding to internal stimuli. She denies mood symptoms. Her acute mental status change appears to have been secondary to a UTI. She is psychiatrically cleared. She should continue to follow up with her outpatient provider.   Treatment Plan Summary: -Continue psychotropic medications as prescribed for mood stabilization/psychosis.  -EKG reviewed and QTc 438 on 8/10. Please closely monitor when starting or increasing QTc prolonging agents.  -Continue follow up with outpatient provider. -Psychiatry will sign off on patient at this time. Please consult psychiatry again as needed.    Disposition: No evidence of imminent risk to self or others at present.   Patient does not meet  criteria for psychiatric inpatient admission.  This service was provided via telemedicine using a 2-way, interactive audio and video technology.  Names of all persons participating in this telemedicine service and their role in this encounter. Name: Buford Dresser, DO Role: Psychiatrist  Name: Karen Best Role: Patient    Faythe Dingwall, DO 12/09/2018 11:23 AM

## 2018-12-09 NOTE — Progress Notes (Signed)
PROGRESS NOTE    Karen Best  KVQ:[id number] DOB: [birth year] DOA: 12/06/2018 PCP: [facility]   Brief Narrative:  The patient is a 63 year old female with a past medical history significant for but not limited to schizophrenia, hepatitis C which was treated with Mavyret, and has other comorbidities who presented secondary to altered mental status and was found to have a E. coli UTI which was sensitive to cephalosporins.  Patient does have a allergy to penicillin but this is low risk and she has tolerated ceftriaxone well.  She improved her mental status but has significantly poor cognition and per her report leaves her stove on at home.  OT and PT evaluated and recommending a skilled nursing facility.  SLP did a cognitive evaluation and feels the patient has moderate dementia and recommends SLP services at a skilled nursing facility.  Patient also has some mild dysarthria and has slower speech.  Will consult social work for skilled nursing facility placement.  Psychiatry consulted for possible paranoid schizophrenia and cleared the patient. Psychiatry feels symptoms could have been related to UTI.   Assessment & Plan:   Principal Problem:   UTI (urinary tract infection) Active Problems:   Schizophrenia ([place])   Acute metabolic encephalopathy   E Coli UTI -UA done showed cloudy appearance with amber color urine, large leukocytes, many bacteria, 0-5 squamous epithelial cells, 6-10 WBCs, as well -Patient empirically started on Ceftriaxone -Urine culture done and showed E. coli greater than 100,000 colonies which was pansensitive and only resistant to Bactrim -Continued Ceftriaxone and transitioned to p.o. Keflex 500 g 3 times daily and will continue for Treatment Course  -PT/OT/SLP evaluation recommending skilled nursing facility currently  Acute metabolic encephalopathy superimposed on likely chronic dementia, improving  -In setting of infection. -Patient  has poor cognition and patient's assisted living facility felt that she was unsafe to return there given that she lives a stove on -SLP and OT evaluated and administered Short Blessed test for memory and cognition she scored a 13 out of 28 and also had a Montreal cognitive assessment 8.1 which she achieved a 13 of 30 -Patient is unsafe to return home and they are recommending skilled nursing facility and Social Work Consulted  -Head CT done without contrast because of her slurred speech showed atrophic changes without acute abnormality -MRI of the brain done without contrast showed no acute intracranial abnormality and normal aging of the brain -Likely patient has dementia and will need formal dementia work-up in the outpatient setting done by neurology -Spoke with neurology and given her continued slurred speech and decreased cognition and Dr. Rory Percy feels that this could be related to her schizophrenia but this is now improved  -We will check a B12 (617), RPR was Non reactive , HIV was nonreactice, ammonia level was 12 and repeat lithium level pending -Repeat UDS Negative  -Patient is stating that her PCP called her to notify of her current STD but no information was gathered so will call PCPs office to verify this; apparently she has a history of doing this and back in June she states that she had an STD and some was told her STD medications but in the interim we will obtain a GC chlamydia probe and RPR and HIV -Got Psychiatry to formally evaluate as this could be some psychosis and paranoia related to her schizophrenia but Dr. Mariea Clonts feels symptoms were from UTI -Continue to monitor and will need SNF at discharge likely  Schizophrenia -Lithium level  on admission was within range and will repeat -Continue with olanzapine 30 mg nightly along with lithium carbonate 450 mg p.o. twice daily -Patient takes Valbenazine Tosylate 80 milligrams p.o. nightly for tardive dyskinesia and will resume  -Psychiatry formally evaluated given her paranoia and she is cleared from a Psych perspective and Dr. Mariea Clonts feels that symptoms were related to UTI  Hepatitis C  -S/p Treatment with Mayvret  -Checked LFTs and they were within normal limits and ammonia level is pending given her AMS  Microcytic Anemia -The patient's hemoglobin/hematocrit is now 11.6/37.3 -Checked Anemia Panel and showed an iron level of 38, U IBC of 267, TIBC of 305, saturation ratios of 12%, ferritin level 90, folate level 10.2, vitamin B12 617 -Continue monitor for signs and symptoms of bleeding; currently no overt bleeding noted -Repeat CBC in a.m.  Questionable STD -Check a GC screen and RPR; still pending and RPR was negative -Continue to monitor and will need to verify it with PCP -Patient claims the PCP called her saying that she had an STD yesterday but denies it today  DVT prophylaxis: Enoxaparin 40 mg subcu every 24h Code Status: FULL CODE Family Communication: No family present at bedside  Disposition Plan: SNF when bed available   Consultants:   Discussed with Neurology Dr. Rory Percy  Psychiatry    Procedures: None   Antimicrobials:  Anti-infectives (From admission, onward)   Start     Dose/Rate Route Frequency Ordered Stop   12/08/18 1400  cephALEXin (KEFLEX) capsule 500 mg     500 mg Oral Every 8 hours 12/08/18 1036     12/08/18 0000  cephALEXin (KEFLEX) 500 MG capsule     500 mg Oral Every 8 hours 12/08/18 1113 12/13/18 2359   12/06/18 2345  cefTRIAXone (ROCEPHIN) 1 g in sodium chloride 0.9 % 100 mL IVPB  Status:  Discontinued     1 g 200 mL/hr over 30 Minutes Intravenous Every 24 hours 12/06/18 2330 12/08/18 1036   12/06/18 2245  levofloxacin (LEVAQUIN) IVPB 500 mg  Status:  Discontinued     500 mg 100 mL/hr over 60 Minutes Intravenous  Once 12/06/18 2241 12/06/18 2330     Subjective: Seen and examined at bedside and was not as slow to respond and her cognition and mentation as she was  improved.  Able to answer questions appropriately.  No chest pain, lightheadedness or dizziness.  No other concerns or complaints at this time.  Objective: Vitals:   12/08/18 2312 12/09/18 0314 12/09/18 0733 12/09/18 1141  BP: 107/65 114/60 123/73 (!) 102/57  Pulse: (!) 57 (!) 49 (!) 53 60  Resp: 18 18 18 18   Temp: 98.6 F (37 C) 97.6 F (36.4 C) 98.7 F (37.1 C) 98.2 F (36.8 C)  TempSrc: Oral Oral Oral Oral  SpO2: 99% 91% 91% 100%  Weight:      Height:        Intake/Output Summary (Last 24 hours) at 12/09/2018 1313 Last data filed at 12/09/2018 0900 Gross per 24 hour  Intake 850 ml  Output -  Net 850 ml   Filed Weights   12/07/18 0029  Weight: 57 kg   Examination: Physical Exam:  Constitutional: Thin African-American female currently no acute distress and appears calm and appears improved from yesterday Eyes: Lids and conjunctive are normal.  Sclera anicteric ENMT: External ears nose appear normal.  Grossly normal hearing Neck: Appears supple no JVD Respiratory: Mildly diminished auscultation bilaterally with no appreciable wheezing, rales, rhonchi. Cardiovascular: Regular rate  and rhythm.  No appreciable murmurs, rubs, gallops.  No lower extremity edema noted Abdomen: Soft, nontender, nondistended.  Bowel sounds present GU: Deferred Musculoskeletal: No contractures or cyanosis.  No joint deformities noted Skin: Skin is warm and dry no appreciable rashes or lesions on limited skin evaluation. Neurologic: Cranial nerves II through XII gross intact no appreciable focal deficits.  Romberg sign cerebellar functions were not assessed Psychiatric: Has normal judgment insight.  She is slightly anxious and she is more awake and alert  Data Reviewed: I have personally reviewed following labs and imaging studies  CBC: Recent Labs  Lab 12/05/18 0210 12/06/18 2045 12/07/18 0348 12/08/18 0821 12/09/18 0433  WBC 8.4 7.6 6.9 6.4 6.2  NEUTROABS 3.6 3.5  --  2.4 2.3  HGB 12.4  12.8 11.5* 11.6* 11.4*  HCT 42.2 42.6 37.4 37.3 38.2  MCV 76.7* 75.3* 74.9* 75.2* 77.0*  PLT 269 292 280 266 093   Basic Metabolic Panel: Recent Labs  Lab 12/05/18 0210 12/06/18 2045 12/07/18 0348 12/08/18 0821 12/09/18 0433  NA 133* 135 137 137 138  K 4.0 3.6 3.6 4.1 3.9  CL 104 104 107 110 107  CO2 21* 21* 22 22 25   GLUCOSE 120* 105* 108* 94 91  BUN 7* 10 7* 5* 6*  CREATININE 0.80 0.81 0.71 0.75 0.84  CALCIUM 9.6 10.2 9.5 9.1 9.3  MG  --   --   --  1.8 1.9  PHOS  --   --   --  3.0 3.5   GFR: Estimated Creatinine Clearance: 62.5 mL/min (by C-G formula based on SCr of 0.84 mg/dL). Liver Function Tests: Recent Labs  Lab 12/05/18 0210 12/06/18 2045 12/08/18 0821 12/09/18 0433  AST 28 28 22 30   ALT 32 29 23 26   ALKPHOS 78 75 66 60  BILITOT 0.4 0.7 0.3 0.2*  PROT 7.2 7.8 6.5 6.4*  ALBUMIN 3.5 3.6 2.9* 2.9*   No results for input(s): LIPASE, AMYLASE in the last 168 hours. Recent Labs  Lab 12/08/18 1650  AMMONIA 12   Coagulation Profile: Recent Labs  Lab 12/05/18 0210  INR 1.1   Cardiac Enzymes: No results for input(s): CKTOTAL, CKMB, CKMBINDEX, TROPONINI in the last 168 hours. BNP (last 3 results) No results for input(s): PROBNP in the last 8760 hours. HbA1C: No results for input(s): HGBA1C in the last 72 hours. CBG: No results for input(s): GLUCAP in the last 168 hours. Lipid Profile: No results for input(s): CHOL, HDL, LDLCALC, TRIG, CHOLHDL, LDLDIRECT in the last 72 hours. Thyroid Function Tests: Recent Labs    12/07/18 0033  TSH 0.826   Anemia Panel: Recent Labs    12/09/18 0433 12/09/18 0440  VITAMINB12 617  --   FOLATE  --  10.2  FERRITIN 90  --   TIBC 305  --   IRON 38  --   RETICCTPCT 1.1  --    Sepsis Labs: No results for input(s): PROCALCITON, LATICACIDVEN in the last 168 hours.  Recent Results (from the past 240 hour(s))  SARS CORONAVIRUS 2 Nasal Swab Aptima Multi Swab     Status: None   Collection Time: 12/06/18  8:59 PM    Specimen: Aptima Multi Swab; Nasal Swab  Result Value Ref Range Status   SARS Coronavirus 2 NEGATIVE NEGATIVE Final    Comment: (NOTE) SARS-CoV-2 target nucleic acids are NOT DETECTED. The SARS-CoV-2 RNA is generally detectable in upper and lower respiratory specimens during the acute phase of infection. Negative results do not preclude SARS-CoV-2  infection, do not rule out co-infections with other pathogens, and should not be used as the sole basis for treatment or other patient management decisions. Negative results must be combined with clinical observations, patient history, and epidemiological information. The expected result is Negative. Fact Sheet for Patients: SugarRoll.be Fact Sheet for Healthcare Providers: https://www.woods-mathews.com/ This test is not yet approved or cleared by the [place] FDA and  has been authorized for detection and/or diagnosis of SARS-CoV-2 by FDA under an Emergency Use Authorization (EUA). This EUA will remain  in effect (meaning this test can be used) for the duration of the COVID-19 declaration under Section 56 4(b)(1) of the Act, 21 U.S.C. section 360bbb-3(b)(1), unless the authorization is terminated or revoked sooner. Performed at [facility], [place] [address] [place], [place] [address]   Urine Culture     Status: Abnormal   Collection Time: 12/06/18  9:38 PM   Specimen: Urine, Random  Result Value Ref Range Status   Specimen Description URINE, RANDOM  Final   Special Requests   Final    NONE Performed at [facility], [place] [address], [place], [place] [address]    Culture >=100,000 COLONIES/mL ESCHERICHIA COLI (A)  Final   Report Status 12/08/2018 FINAL  Final   Organism ID, Bacteria ESCHERICHIA COLI (A)  Final      Susceptibility   Escherichia coli - MIC*    AMPICILLIN >=32 RESISTANT Resistant     CEFAZOLIN <=4 SENSITIVE Sensitive     CEFTRIAXONE <=1 SENSITIVE Sensitive      CIPROFLOXACIN <=0.25 SENSITIVE Sensitive     GENTAMICIN <=1 SENSITIVE Sensitive     IMIPENEM <=0.25 SENSITIVE Sensitive     NITROFURANTOIN <=16 SENSITIVE Sensitive     TRIMETH/SULFA >=320 RESISTANT Resistant     AMPICILLIN/SULBACTAM 4 SENSITIVE Sensitive     PIP/TAZO <=4 SENSITIVE Sensitive     Extended ESBL NEGATIVE Sensitive     * >=100,000 COLONIES/mL ESCHERICHIA COLI    Radiology Studies: No results found.  Scheduled Meds: . cephALEXin  500 mg Oral Q8H  . enoxaparin (LOVENOX) injection  40 mg Subcutaneous Q24H  . lithium carbonate  450 mg Oral BID  . OLANZapine  10 mg Oral QHS  . OLANZapine  20 mg Oral QHS  . Valbenazine Tosylate  1 tablet Oral QHS   Continuous Infusions:   LOS: 2 days   Kerney Elbe, DO [facility] PAGER is on AMION  If 7PM-7AM, please contact night-coverage [email or web address] Password [email or web address] 12/09/2018, 1:13 PM

## 2018-12-09 NOTE — Progress Notes (Signed)
  Speech Language Pathology Treatment: Cognitive-Linquistic  Patient Details Name: VIRGA HALTIWANGER MRN: [id number] DOB: 28-May-1955 Today's Date: 12/09/2018 Time: 6213-0865 SLP Time Calculation (min) (ACUTE ONLY): 26 min  Assessment / Plan / Recommendation Clinical Impression  Pt was seen for cognitive-linguistic treatment and was cooperative throughout the session. She indicated that her speech is improved compared to yesterday and is currently approximatley 60% back to baseline. She required mod cues for attention and including all steps during sequencing tasks. She required mod-max cues for a 3-task sequencing mental manipulation and ultimately requested that the task be discontinued since she was "tired of it". She required min-mod support for problem solving related to her placing her dinner order. She was educated regarding the nature of dysarthria, and compensatory strategies to improve speech intelligibility. Pt verbalized understanding regarding all areas of education. She used compensatory strategies at the phrase level with 70% accuracy increasing to 100% accuracy with mod cues for overarticulation and rate. SLP will continue to follow pt.    HPI HPI: Pt is a 63 y.o. female with medical history significant for schizophrenia and hepatitis C who presented to the ED for evaluation of altered mental status. Pt was initially seen in ED on 12/05/2018 due to reported slurred speech, although there was question about speech abnormality at her baseline. She had a CT head and MRI brain were negative for acute infarct and she was discharged home on that date. She returned to the ED on 12/06/18 secondary to "slow and garbled speech" and lethargy. Pt has been diagnosed with acute metabolic encephalopathy due to UTI.       SLP Plan  Continue with current plan of care       Recommendations                   Follow up Recommendations: 24 hour supervision/assistance;Skilled Nursing facility SLP  Visit Diagnosis: Cognitive communication deficit (R41.841);Dysarthria and anarthria (R47.1) Plan: Continue with current plan of care       Gaylene Moylan I. Hardin Negus, [place], [place] Office number [phone] Pager [place] 12/09/2018, 4:41 PM

## 2018-12-10 LAB — CBC WITH DIFFERENTIAL/PLATELET
Abs Immature Granulocytes: 0.03 10*3/uL (ref 0.00–0.07)
Basophils Absolute: 0 10*3/uL (ref 0.0–0.1)
Basophils Relative: 0 %
Eosinophils Absolute: 1.3 10*3/uL — ABNORMAL HIGH (ref 0.0–0.5)
Eosinophils Relative: 20 %
HCT: 39.3 % (ref 36.0–46.0)
Hemoglobin: 11.8 g/dL — ABNORMAL LOW (ref 12.0–15.0)
Immature Granulocytes: 0 %
Lymphocytes Relative: 30 %
Lymphs Abs: 2 10*3/uL (ref 0.7–4.0)
MCH: 22.9 pg — ABNORMAL LOW (ref 26.0–34.0)
MCHC: 30 g/dL (ref 30.0–36.0)
MCV: 76.2 fL — ABNORMAL LOW (ref 80.0–100.0)
Monocytes Absolute: 0.6 10*3/uL (ref 0.1–1.0)
Monocytes Relative: 9 %
Neutro Abs: 2.8 10*3/uL (ref 1.7–7.7)
Neutrophils Relative %: 41 %
Platelets: 312 10*3/uL (ref 150–400)
RBC: 5.16 MIL/uL — ABNORMAL HIGH (ref 3.87–5.11)
RDW: 16.8 % — ABNORMAL HIGH (ref 11.5–15.5)
WBC: 6.7 10*3/uL (ref 4.0–10.5)
nRBC: 0 % (ref 0.0–0.2)

## 2018-12-10 LAB — GC/CHLAMYDIA PROBE AMP (~~LOC~~) NOT AT ARMC
Chlamydia: NEGATIVE
Neisseria Gonorrhea: NEGATIVE

## 2018-12-10 LAB — MAGNESIUM: Magnesium: 1.8 mg/dL (ref 1.7–2.4)

## 2018-12-10 LAB — COMPREHENSIVE METABOLIC PANEL
ALT: 37 U/L (ref 0–44)
AST: 41 U/L (ref 15–41)
Albumin: 3 g/dL — ABNORMAL LOW (ref 3.5–5.0)
Alkaline Phosphatase: 64 U/L (ref 38–126)
Anion gap: 6 (ref 5–15)
BUN: 7 mg/dL — ABNORMAL LOW (ref 8–23)
CO2: 23 mmol/L (ref 22–32)
Calcium: 9.4 mg/dL (ref 8.9–10.3)
Chloride: 108 mmol/L (ref 98–111)
Creatinine, Ser: 0.77 mg/dL (ref 0.44–1.00)
GFR calc Af Amer: >60 mL/min (ref >60)
GFR calc non Af Amer: >60 mL/min (ref >60)
Glucose, Bld: 104 mg/dL — ABNORMAL HIGH (ref 70–99)
Potassium: 4.1 mmol/L (ref 3.5–5.1)
Sodium: 137 mmol/L (ref 135–145)
Total Bilirubin: 0.3 mg/dL (ref 0.3–1.2)
Total Protein: 6.6 g/dL (ref 6.5–8.1)

## 2018-12-10 LAB — PHOSPHORUS: Phosphorus: 3.6 mg/dL (ref 2.5–4.6)

## 2018-12-10 NOTE — Progress Notes (Signed)
PROGRESS NOTE    Karen Best  SWF:[id number] DOB: [birth year] DOA: 12/06/2018 PCP: [facility]   Brief Narrative:  The patient is a 63 year old female with a past medical history significant for but not limited to schizophrenia, hepatitis C which was treated with Mavyret, and has other comorbidities who presented secondary to altered mental status and was found to have a E. coli UTI which was sensitive to cephalosporins.  Patient does have a allergy to penicillin but this is low risk and she has tolerated ceftriaxone well.  She improved her mental status but has significantly poor cognition and per her report leaves her stove on at home.  OT and PT evaluated and recommending a skilled nursing facility.  SLP did a cognitive evaluation and feels the patient has moderate dementia and recommends SLP services at a skilled nursing facility.  Patient also has some mild dysarthria and has slower speech.  Will consult social work for skilled nursing facility placement.  Psychiatry consulted for possible paranoid schizophrenia and cleared the patient. Psychiatry feels symptoms could have been related to UTI.  Patient is currently agreeable to SNF however will need a PASRR Number before she can be D/C'd/   Assessment & Plan:   Principal Problem:   Delirium due to another medical condition Active Problems:   Schizophrenia ([place])   UTI (urinary tract infection)   Acute metabolic encephalopathy   E Coli UTI -UA done showed cloudy appearance with amber color urine, large leukocytes, many bacteria, 0-5 squamous epithelial cells, 6-10 WBCs, as well -Patient empirically started on Ceftriaxone -Urine culture done and showed E. coli greater than 100,000 colonies which was pansensitive and only resistant to Bactrim -Continued Ceftriaxone and transitioned to p.o. Keflex 500 g 3 times daily and will continue for Treatment Course  -PT/OT/SLP evaluation recommending skilled nursing  facility currently -Case management involved and is obtaining a PASAR for D/C  Acute metabolic encephalopathy superimposed on likely chronic dementia, improving   -In setting of infection. -Patient has poor cognition and patient's assisted living facility felt that she was unsafe to return there given that she lives a stove on -SLP and OT evaluated and administered Short Blessed test for memory and cognition she scored a 13 out of 28 and also had a Montreal cognitive assessment 8.1 which she achieved a 13 of 30; SLP continues to follow and recommends continuing with current plan of care -Patient is unsafe to return home and they are recommending skilled nursing facility and Social Work Consulted  -Head CT done without contrast because of her slurred speech showed atrophic changes without acute abnormality -MRI of the brain done without contrast showed no acute intracranial abnormality and normal aging of the brain -Likely patient has dementia and will need formal dementia work-up in the outpatient setting done by neurology -Spoke with neurology and given her continued slurred speech and decreased cognition and Dr. Rory Percy feels that this could be related to her schizophrenia but this is now improved  -We will check a B12 (617), RPR was Non reactive , HIV was nonreactice, ammonia level was 12 and repeat lithium level pending -Repeat UDS Negative  -Patient is stating that her PCP called her to notify of her current STD but no information was gathered so will call PCPs office to verify this; apparently she has a history of doing this and back in June she states that she had an STD and some was told her STD medications but in the interim we  will obtain a GC chlamydia probe and RPR and HIV, all of which have been negative -Got Psychiatry to formally evaluate as this could be some psychosis and paranoia related to her schizophrenia but Dr. Mariea Clonts feels symptoms were from UTI -Continue to monitor and will  need SNF at discharge likely  Schizophrenia -Lithium level on admission was within range and will repeat -Continue with olanzapine 30 mg nightly along with lithium carbonate 450 mg p.o. twice daily -Patient takes Valbenazine Tosylate 80 milligrams p.o. nightly for tardive dyskinesia and will resume -Psychiatry formally evaluated given her paranoia and she is cleared from a Psych perspective and Dr. Mariea Clonts feels that symptoms were related to UTI  Hepatitis C  -S/p Treatment with Mayvret  -Checked LFTs and they were within normal limits and ammonia level is pending given her AMS  Microcytic Anemia -The patient's hemoglobin/hematocrit is now 11.6/37.3 -Checked Anemia Panel and showed an iron level of 38, U IBC of 267, TIBC of 305, saturation ratios of 12%, ferritin level 90, folate level 10.2, vitamin B12 617 -Continue monitor for signs and symptoms of bleeding; currently no overt bleeding noted -Repeat CBC in a.m.  Questionable STD -Check a GC screen and RPR; both GC and chlamydia screen are negative and RPR was negative -Continue to monitor and will need to verify it with PCP -Patient claims the PCP called her saying that she had an STD yesterday but denies it today; likely this was related to her encephalopathy  DVT prophylaxis: Enoxaparin 40 mg subcu every 24h Code Status: FULL CODE Family Communication: No family present at bedside  Disposition Plan: SNF when bed available and PASRR obtained   Consultants:   Discussed with Neurology Dr. Rory Percy  Psychiatry    Procedures: None   Antimicrobials:  Anti-infectives (From admission, onward)   Start     Dose/Rate Route Frequency Ordered Stop   12/08/18 1400  cephALEXin (KEFLEX) capsule 500 mg     500 mg Oral Every 8 hours 12/08/18 1036     12/08/18 0000  cephALEXin (KEFLEX) 500 MG capsule     500 mg Oral Every 8 hours 12/08/18 1113 12/13/18 2359   12/06/18 2345  cefTRIAXone (ROCEPHIN) 1 g in sodium chloride 0.9 % 100 mL IVPB   Status:  Discontinued     1 g 200 mL/hr over 30 Minutes Intravenous Every 24 hours 12/06/18 2330 12/08/18 1036   12/06/18 2245  levofloxacin (LEVAQUIN) IVPB 500 mg  Status:  Discontinued     500 mg 100 mL/hr over 60 Minutes Intravenous  Once 12/06/18 2241 12/06/18 2330     Subjective: Seen and examined at bedside and felt okay.  Denies chest pain, lightheadedness or dizziness.  Currently agreeable to SNF.  No nausea or vomiting.  No other concerns or complaints at this time.  Objective: Vitals:   12/10/18 0500 12/10/18 0807 12/10/18 1214 12/10/18 1613  BP:  116/70 118/70 (!) 141/72  Pulse:  (!) 58 (!) 56 65  Resp:  16 16 16   Temp:  98.4 F (36.9 C) 98.6 F (37 C) 98.6 F (37 C)  TempSrc:  Oral Oral Oral  SpO2:  98% 100% 100%  Weight: 59.2 kg     Height:        Intake/Output Summary (Last 24 hours) at 12/10/2018 1636 Last data filed at 12/10/2018 1214 Gross per 24 hour  Intake 1640 ml  Output 3000 ml  Net -1360 ml   Filed Weights   12/07/18 0029 12/10/18 0500  Weight: 57  kg 59.2 kg   Examination: Physical Exam:  Constitutional: Thin African-American female currently no acute distress appears calm and is pleasant today Eyes: Lids and conjunctive are normal.  Sclera anicteric ENMT the: External ears nose appear normal.  Grossly normal Neck: Appears supple no JVD Respiratory: Mildly diminished auscultation bilaterally with no appreciable wheezing, rales, rhonchi.  Patient not tachypneic or using any accessory muscles to breathe Cardiovascular: Regular rate and rhythm.  No appreciable murmurs, rubs, gallops.  No lower extremity edema noted Abdomen: Soft, nontender, nondistended.  Bowel sounds present. GU: Deferred Musculoskeletal: No contractures or cyanosis.  No joint deformities in upper lower extremities noted Skin: Skin is warm and dry no appreciable rashes or lesions on to skin evaluation Neurologic: Cranial nerves II through XII grossly intact no appreciable focal  deficits.  Romberg sign cerebellar reflexes were not assessed Psychiatric: She is awake, alert, oriented.  Not as anxious today  Data Reviewed: I have personally reviewed following labs and imaging studies  CBC: Recent Labs  Lab 12/05/18 0210 12/06/18 2045 12/07/18 0348 12/08/18 0821 12/09/18 0433 12/10/18 0524  WBC 8.4 7.6 6.9 6.4 6.2 6.7  NEUTROABS 3.6 3.5  --  2.4 2.3 2.8  HGB 12.4 12.8 11.5* 11.6* 11.4* 11.8*  HCT 42.2 42.6 37.4 37.3 38.2 39.3  MCV 76.7* 75.3* 74.9* 75.2* 77.0* 76.2*  PLT 269 292 280 266 284 671   Basic Metabolic Panel: Recent Labs  Lab 12/06/18 2045 12/07/18 0348 12/08/18 0821 12/09/18 0433 12/10/18 0524  NA 135 137 137 138 137  K 3.6 3.6 4.1 3.9 4.1  CL 104 107 110 107 108  CO2 21* 22 22 25 23   GLUCOSE 105* 108* 94 91 104*  BUN 10 7* 5* 6* 7*  CREATININE 0.81 0.71 0.75 0.84 0.77  CALCIUM 10.2 9.5 9.1 9.3 9.4  MG  --   --  1.8 1.9 1.8  PHOS  --   --  3.0 3.5 3.6   GFR: Estimated Creatinine Clearance: 65.6 mL/min (by C-G formula based on SCr of 0.77 mg/dL). Liver Function Tests: Recent Labs  Lab 12/05/18 0210 12/06/18 2045 12/08/18 0821 12/09/18 0433 12/10/18 0524  AST 28 28 22 30  41  ALT 32 29 23 26  37  ALKPHOS 78 75 66 60 64  BILITOT 0.4 0.7 0.3 0.2* 0.3  PROT 7.2 7.8 6.5 6.4* 6.6  ALBUMIN 3.5 3.6 2.9* 2.9* 3.0*   No results for input(s): LIPASE, AMYLASE in the last 168 hours. Recent Labs  Lab 12/08/18 1650  AMMONIA 12   Coagulation Profile: Recent Labs  Lab 12/05/18 0210  INR 1.1   Cardiac Enzymes: No results for input(s): CKTOTAL, CKMB, CKMBINDEX, TROPONINI in the last 168 hours. BNP (last 3 results) No results for input(s): PROBNP in the last 8760 hours. HbA1C: No results for input(s): HGBA1C in the last 72 hours. CBG: No results for input(s): GLUCAP in the last 168 hours. Lipid Profile: No results for input(s): CHOL, HDL, LDLCALC, TRIG, CHOLHDL, LDLDIRECT in the last 72 hours. Thyroid Function Tests: No results  for input(s): TSH, T4TOTAL, FREET4, T3FREE, THYROIDAB in the last 72 hours. Anemia Panel: Recent Labs    12/09/18 0433 12/09/18 0440  VITAMINB12 617  --   FOLATE  --  10.2  FERRITIN 90  --   TIBC 305  --   IRON 38  --   RETICCTPCT 1.1  --    Sepsis Labs: No results for input(s): PROCALCITON, LATICACIDVEN in the last 168 hours.  Recent Results (from the  past 240 hour(s))  SARS CORONAVIRUS 2 Nasal Swab Aptima Multi Swab     Status: None   Collection Time: 12/06/18  8:59 PM   Specimen: Aptima Multi Swab; Nasal Swab  Result Value Ref Range Status   SARS Coronavirus 2 NEGATIVE NEGATIVE Final    Comment: (NOTE) SARS-CoV-2 target nucleic acids are NOT DETECTED. The SARS-CoV-2 RNA is generally detectable in upper and lower respiratory specimens during the acute phase of infection. Negative results do not preclude SARS-CoV-2 infection, do not rule out co-infections with other pathogens, and should not be used as the sole basis for treatment or other patient management decisions. Negative results must be combined with clinical observations, patient history, and epidemiological information. The expected result is Negative. Fact Sheet for Patients: SugarRoll.be Fact Sheet for Healthcare Providers: https://www.woods-mathews.com/ This test is not yet approved or cleared by the [place] FDA and  has been authorized for detection and/or diagnosis of SARS-CoV-2 by FDA under an Emergency Use Authorization (EUA). This EUA will remain  in effect (meaning this test can be used) for the duration of the COVID-19 declaration under Section 56 4(b)(1) of the Act, 21 U.S.C. section 360bbb-3(b)(1), unless the authorization is terminated or revoked sooner. Performed at [facility], [place] [address] [place], [place] [address]   Urine Culture     Status: Abnormal   Collection Time: 12/06/18  9:38 PM   Specimen: Urine, Random  Result Value Ref  Range Status   Specimen Description URINE, RANDOM  Final   Special Requests   Final    NONE Performed at [facility], [place] [address], [place], [place] [address]    Culture >=100,000 COLONIES/mL ESCHERICHIA COLI (A)  Final   Report Status 12/08/2018 FINAL  Final   Organism ID, Bacteria ESCHERICHIA COLI (A)  Final      Susceptibility   Escherichia coli - MIC*    AMPICILLIN >=32 RESISTANT Resistant     CEFAZOLIN <=4 SENSITIVE Sensitive     CEFTRIAXONE <=1 SENSITIVE Sensitive     CIPROFLOXACIN <=0.25 SENSITIVE Sensitive     GENTAMICIN <=1 SENSITIVE Sensitive     IMIPENEM <=0.25 SENSITIVE Sensitive     NITROFURANTOIN <=16 SENSITIVE Sensitive     TRIMETH/SULFA >=320 RESISTANT Resistant     AMPICILLIN/SULBACTAM 4 SENSITIVE Sensitive     PIP/TAZO <=4 SENSITIVE Sensitive     Extended ESBL NEGATIVE Sensitive     * >=100,000 COLONIES/mL ESCHERICHIA COLI    Radiology Studies: No results found.  Scheduled Meds: . cephALEXin  500 mg Oral Q8H  . enoxaparin (LOVENOX) injection  40 mg Subcutaneous Q24H  . lithium carbonate  450 mg Oral BID  . OLANZapine  10 mg Oral QHS  . OLANZapine  20 mg Oral QHS   Continuous Infusions:   LOS: 3 days   Kerney Elbe, DO [facility] PAGER is on [place]  If 7PM-7AM, please contact night-coverage [email or web address] Password [facility] 12/10/2018, 4:36 PM

## 2018-12-10 NOTE — Progress Notes (Signed)
Physical Therapy Treatment Patient Details Name: Karen Best MRN: [id number] DOB: 1956/01/22 Today's Date: 12/10/2018    History of Present Illness Pt is a 63 y/o female admitted secondary to AMS. Found to have UTI. PMH includes a fib, schizophrenia, bipolar disorder, and hep C.     PT Comments    Patient seen for mobility progression. Continue to progress as tolerated with anticipated d/c to SNF for further skilled PT services.     Follow Up Recommendations  SNF;Supervision/Assistance - 24 hour     Equipment Recommendations  Other (comment)(TBD)    Recommendations for Other Services       Precautions / Restrictions Precautions Precautions: Fall Restrictions Weight Bearing Restrictions: No    Mobility  Bed Mobility Overal bed mobility: Needs Assistance Bed Mobility: Supine to Sit     Supine to sit: Supervision     General bed mobility comments: supervision for safety  Transfers Overall transfer level: Needs assistance Equipment used: 1 person hand held assist Transfers: Sit to/from Stand Sit to Stand: Min assist;Min guard Stand pivot transfers: Min guard       General transfer comment: pt stood from EOB and chair; assist to steady; SPT to get from blue chair in room to recliner with chair alarm pad   Ambulation/Gait Ambulation/Gait assistance: Min assist Gait Distance (Feet): (~14 ft total) Assistive device: 1 person hand held assist;Rolling walker (2 wheeled) Gait Pattern/deviations: Shuffle;Step-to pattern;Narrow base of support Gait velocity: Decreased   General Gait Details: initially pt ambulated ~6 ft with HHA +1 and then states "that's it" and despite cues returned back to EOB to sit; pt then ambulated ~6-8 ft with RW and stated "that's it" and very quickly turned to sit in chair; pt with shuffling gait and repeating "i'm afraid of heights. I cant' walk around there"   Stairs             Wheelchair Mobility    Modified Rankin (Stroke  Patients Only)       Balance Overall balance assessment: Needs assistance Sitting-balance support: Bilateral upper extremity supported;Feet supported Sitting balance-Leahy Scale: Good     Standing balance support: Single extremity supported Standing balance-Leahy Scale: Poor Standing balance comment: Reliant on UE and external support.                             Cognition Arousal/Alertness: Awake/alert Behavior During Therapy: WFL for tasks assessed/performed Overall Cognitive Status: Impaired/Different from baseline Area of Impairment: Memory;Following commands;Safety/judgement;Problem solving                     Memory: Decreased short-term memory Following Commands: Follows one step commands with increased time Safety/Judgement: Decreased awareness of safety;Decreased awareness of deficits   Problem Solving: Slow processing;Requires verbal cues        Exercises      General Comments        Pertinent Vitals/Pain Pain Assessment: No/denies pain    Home Living                      Prior Function            PT Goals (current goals can now be found in the care plan section) Acute Rehab PT Goals Patient Stated Goal: to feel better Progress towards PT goals: Progressing toward goals    Frequency    Min 2X/week      PT Plan Current plan remains appropriate  Co-evaluation              AM-PAC PT "6 Clicks" Mobility   Outcome Measure  Help needed turning from your back to your side while in a flat bed without using bedrails?: A Little Help needed moving from lying on your back to sitting on the side of a flat bed without using bedrails?: A Little Help needed moving to and from a bed to a chair (including a wheelchair)?: A Little Help needed standing up from a chair using your arms (e.g., wheelchair or bedside chair)?: A Little Help needed to walk in hospital room?: A Little Help needed climbing 3-5 steps with a  railing? : A Lot 6 Click Score: 17    End of Session Equipment Utilized During Treatment: Gait belt Activity Tolerance: Patient tolerated treatment well Patient left: with call bell/phone within reach;in chair;with chair alarm set Nurse Communication: Mobility status PT Visit Diagnosis: Unsteadiness on feet (R26.81);Muscle weakness (generalized) (M62.81)     Time: 8832-5498 PT Time Calculation (min) (ACUTE ONLY): 26 min  Charges:  $Gait Training: 23-37 mins                     Earney Navy, PTA Acute Rehabilitation Services Pager: [phone] Office: [phone]     Darliss Cheney 12/10/2018, 4:45 PM

## 2018-12-11 ENCOUNTER — Inpatient Hospital Stay (HOSPITAL_COMMUNITY): Payer: Medicaid Other

## 2018-12-11 DIAGNOSIS — R945 Abnormal results of liver function studies: Secondary | ICD-10-CM

## 2018-12-11 LAB — CBC WITH DIFFERENTIAL/PLATELET
Abs Immature Granulocytes: 0.04 10*3/uL (ref 0.00–0.07)
Basophils Absolute: 0 10*3/uL (ref 0.0–0.1)
Basophils Relative: 0 %
Eosinophils Absolute: 1.2 10*3/uL — ABNORMAL HIGH (ref 0.0–0.5)
Eosinophils Relative: 18 %
HCT: 42.6 % (ref 36.0–46.0)
Hemoglobin: 13 g/dL (ref 12.0–15.0)
Immature Granulocytes: 1 %
Lymphocytes Relative: 32 %
Lymphs Abs: 2.2 10*3/uL (ref 0.7–4.0)
MCH: 23 pg — ABNORMAL LOW (ref 26.0–34.0)
MCHC: 30.5 g/dL (ref 30.0–36.0)
MCV: 75.4 fL — ABNORMAL LOW (ref 80.0–100.0)
Monocytes Absolute: 0.6 10*3/uL (ref 0.1–1.0)
Monocytes Relative: 8 %
Neutro Abs: 2.9 10*3/uL (ref 1.7–7.7)
Neutrophils Relative %: 41 %
Platelets: 306 10*3/uL (ref 150–400)
RBC: 5.65 MIL/uL — ABNORMAL HIGH (ref 3.87–5.11)
RDW: 16.8 % — ABNORMAL HIGH (ref 11.5–15.5)
WBC: 7 10*3/uL (ref 4.0–10.5)
nRBC: 0 % (ref 0.0–0.2)

## 2018-12-11 LAB — COMPREHENSIVE METABOLIC PANEL
ALT: 45 U/L — ABNORMAL HIGH (ref 0–44)
AST: 44 U/L — ABNORMAL HIGH (ref 15–41)
Albumin: 3.2 g/dL — ABNORMAL LOW (ref 3.5–5.0)
Alkaline Phosphatase: 74 U/L (ref 38–126)
Anion gap: 8 (ref 5–15)
BUN: 6 mg/dL — ABNORMAL LOW (ref 8–23)
CO2: 21 mmol/L — ABNORMAL LOW (ref 22–32)
Calcium: 9.9 mg/dL (ref 8.9–10.3)
Chloride: 108 mmol/L (ref 98–111)
Creatinine, Ser: 0.61 mg/dL (ref 0.44–1.00)
GFR calc Af Amer: >60 mL/min (ref >60)
GFR calc non Af Amer: >60 mL/min (ref >60)
Glucose, Bld: 109 mg/dL — ABNORMAL HIGH (ref 70–99)
Potassium: 3.8 mmol/L (ref 3.5–5.1)
Sodium: 137 mmol/L (ref 135–145)
Total Bilirubin: 0.5 mg/dL (ref 0.3–1.2)
Total Protein: 6.9 g/dL (ref 6.5–8.1)

## 2018-12-11 LAB — PHOSPHORUS: Phosphorus: 4.2 mg/dL (ref 2.5–4.6)

## 2018-12-11 LAB — MAGNESIUM: Magnesium: 1.9 mg/dL (ref 1.7–2.4)

## 2018-12-11 NOTE — Progress Notes (Signed)
PROGRESS NOTE    Karen Best  QIO:[id number] DOB: 1956-04-13 DOA: 12/06/2018 PCP: [facility]   Brief Narrative:  The patient is a 63 year old female with a past medical history significant for but not limited to schizophrenia, hepatitis C which was treated with Mavyret, and has other comorbidities who presented secondary to altered mental status and was found to have a E. coli UTI which was sensitive to cephalosporins.  Patient does have a allergy to penicillin but this is low risk and she has tolerated ceftriaxone well.  She improved her mental status but has significantly poor cognition and per her report leaves her stove on at home.  OT and PT evaluated and recommending a skilled nursing facility.  SLP did a cognitive evaluation and feels the patient has moderate dementia and recommends SLP services at a skilled nursing facility.  Patient also has some mild dysarthria and has slower speech.  Will consult social work for skilled nursing facility placement.  Psychiatry consulted for possible paranoid schizophrenia and cleared the patient. Psychiatry feels symptoms could have been related to UTI.  Patient is currently agreeable to SNF however will need a PASRR Number before she can be D/C'd.  Still awaiting SNF placement.  Assessment & Plan:   Principal Problem:   Delirium due to another medical condition Active Problems:   Schizophrenia ([place])   UTI (urinary tract infection)   Acute metabolic encephalopathy   E Coli UTI -UA done showed cloudy appearance with amber color urine, large leukocytes, many bacteria, 0-5 squamous epithelial cells, 6-10 WBCs, as well -Patient empirically started on Ceftriaxone -Urine culture done and showed E. coli greater than 100,000 colonies which was pansensitive and only resistant to Bactrim -Continued Ceftriaxone and transitioned to p.o. Keflex 500 g 3 times daily and will continue for Treatment Course  -PT/OT/SLP evaluation  recommending skilled nursing facility currently -Case management involved and is obtaining a PASAR for D/C  Acute metabolic encephalopathy superimposed on likely chronic dementia, improving   -In setting of infection. -Patient has poor cognition and patient's assisted living facility felt that she was unsafe to return there given that she lives a stove on -SLP and OT evaluated and administered Short Blessed test for memory and cognition she scored a 13 out of 28 and also had a Montreal cognitive assessment 8.1 which she achieved a 13 of 30; SLP continues to follow and recommends continuing with current plan of care -Patient is unsafe to return home and they are recommending skilled nursing facility and Social Work Consulted  -Head CT done without contrast because of her slurred speech showed atrophic changes without acute abnormality -MRI of the brain done without contrast showed no acute intracranial abnormality and normal aging of the brain -Likely patient has dementia and will need formal dementia work-up in the outpatient setting done by neurology -Spoke with neurology and given her continued slurred speech and decreased cognition and Dr. Rory Percy feels that this could be related to her schizophrenia but this is now improved  -We will check a B12 (617), RPR was Non reactive , HIV was nonreactice, ammonia level was 12 and repeat lithium level pending -Repeat UDS Negative  -Patient is stating that her PCP called her to notify of her current STD but no information was gathered so will call PCPs office to verify this; apparently she has a history of doing this and back in June she states that she had an STD and some was told her STD medications but  in the interim we will obtain a GC chlamydia probe and RPR and HIV, all of which have been negative -Got Psychiatry to formally evaluate as this could be some psychosis and paranoia related to her schizophrenia but Dr. Mariea Clonts feels symptoms were from  UTI -Continue to monitor and will need SNF at discharge likely  Schizophrenia -Lithium level on admission was within range and will repeat -Continue with olanzapine 30 mg nightly along with lithium carbonate 450 mg p.o. twice daily -Patient takes Valbenazine Tosylate 80 milligrams p.o. nightly for tardive dyskinesia and will resume -Psychiatry formally evaluated given her paranoia and she is cleared from a Psych perspective and Dr. Mariea Clonts feels that symptoms were related to UTI  Hepatitis C  -S/p Treatment with Mayvret  -Checked LFTs and they were within normal limits and ammonia level is pending given her AMS  Microcytic Anemia -The patient's hemoglobin/hematocrit is now 13.0/42.6 -Checked Anemia Panel and showed an iron level of 38, U IBC of 267, TIBC of 305, saturation ratios of 12%, ferritin level 90, folate level 10.2, vitamin B12 617 -Continue monitor for signs and symptoms of bleeding; currently no overt bleeding noted -Repeat CBC in a.m.  Questionable STD -Check a GC screen and RPR; both GC and chlamydia screen are negative and RPR was negative -Continue to monitor and will need to verify it with PCP -Patient claims the PCP called her saying that she had an STD yesterday but denies it today; likely this was related to her encephalopathy  Abnormal LFTs, mildly elevated  -Patient's AST slightly elevated is now 44 and ALT is now 45 -Check acute hepatitis panel and right upper quadrant ultrasound -Continue monitor and trend Hepatic function -Repeat CMP in a.m.  Metabolic Acidosis -Patient CO2 is now 21 and anion gap is 8; chloride was 108 -Continue monitor and trend and repeat CMP in the AM  DVT prophylaxis: Enoxaparin 40 mg subcu every 24h Code Status: FULL CODE Family Communication: No family present at bedside  Disposition Plan: SNF when bed available and PASRR obtained   Consultants:   Discussed with Neurology Dr. Rory Percy  Psychiatry    Procedures:  None   Antimicrobials:  Anti-infectives (From admission, onward)   Start     Dose/Rate Route Frequency Ordered Stop   12/08/18 1400  cephALEXin (KEFLEX) capsule 500 mg     500 mg Oral Every 8 hours 12/08/18 1036     12/08/18 0000  cephALEXin (KEFLEX) 500 MG capsule     500 mg Oral Every 8 hours 12/08/18 1113 12/13/18 2359   12/06/18 2345  cefTRIAXone (ROCEPHIN) 1 g in sodium chloride 0.9 % 100 mL IVPB  Status:  Discontinued     1 g 200 mL/hr over 30 Minutes Intravenous Every 24 hours 12/06/18 2330 12/08/18 1036   12/06/18 2245  levofloxacin (LEVAQUIN) IVPB 500 mg  Status:  Discontinued     500 mg 100 mL/hr over 60 Minutes Intravenous  Once 12/06/18 2241 12/06/18 2330     Subjective: Seen and examined at bedside had no complaints.  Asking about SNF.  No chest pain, lightheadedness or dizziness.  No other concerns or plans at this time and denies any burning or discomfort in urine.  Objective: Vitals:   12/11/18 0408 12/11/18 0500 12/11/18 0900 12/11/18 1100  BP: (!) 109/57  (!) 97/45 128/66  Pulse: 60  98 (!) 59  Resp: 16  17 17   Temp: 97.9 F (36.6 C)  98 F (36.7 C) 97.7 F (36.5 C)  TempSrc:  Oral  Oral Oral  SpO2: 98%  100% 100%  Weight:  58.9 kg    Height:        Intake/Output Summary (Last 24 hours) at 12/11/2018 1557 Last data filed at 12/11/2018 1200 Gross per 24 hour  Intake 2007 ml  Output 1400 ml  Net 607 ml   Filed Weights   12/07/18 0029 12/10/18 0500 12/11/18 0500  Weight: 57 kg 59.2 kg 58.9 kg   Examination: Physical Exam:  Constitutional: Thin African-American female currently no acute distress and is calm and appears comfortable and has a pleasant mood and affect Eyes: Lids and conjunctive are normal.  Sclera anicteric ENMT: External ears and nose appear normal.  Grossly normal hearing Neck: Appears supple no JVD Respiratory: Mildly diminished auscultation bilaterally no appreciable wheezing, rales, rhonchi.  Patient not tachypneic wheezing  accessory muscle breathe Cardiovascular: Regular rate and rhythm.  No appreciable murmurs, rubs or gallops.  No lower extremity edema noted Abdomen: Soft, nontender, nondistended.  Bowel sounds present GU: Deferred Musculoskeletal: No contractures or cyanosis.  No joint deformities in the upper lower extremities Skin: Skin is warm and dry with no appreciable rashes or lesions limited skin evaluation Neurologic: Cranial nerves II through XII grossly intact no appreciable focal deficits.  Romberg sign cerebellar reflexes were not assessed Psychiatric: Awake, alert, oriented.  Not anxious.  Data Reviewed: I have personally reviewed following labs and imaging studies  CBC: Recent Labs  Lab 12/06/18 2045 12/07/18 0348 12/08/18 0821 12/09/18 0433 12/10/18 0524 12/11/18 0359  WBC 7.6 6.9 6.4 6.2 6.7 7.0  NEUTROABS 3.5  --  2.4 2.3 2.8 2.9  HGB 12.8 11.5* 11.6* 11.4* 11.8* 13.0  HCT 42.6 37.4 37.3 38.2 39.3 42.6  MCV 75.3* 74.9* 75.2* 77.0* 76.2* 75.4*  PLT 292 280 266 284 312 841   Basic Metabolic Panel: Recent Labs  Lab 12/07/18 0348 12/08/18 0821 12/09/18 0433 12/10/18 0524 12/11/18 0359  NA 137 137 138 137 137  K 3.6 4.1 3.9 4.1 3.8  CL 107 110 107 108 108  CO2 22 22 25 23  21*  GLUCOSE 108* 94 91 104* 109*  BUN 7* 5* 6* 7* 6*  CREATININE 0.71 0.75 0.84 0.77 0.61  CALCIUM 9.5 9.1 9.3 9.4 9.9  MG  --  1.8 1.9 1.8 1.9  PHOS  --  3.0 3.5 3.6 4.2   GFR: Estimated Creatinine Clearance: 65.6 mL/min (by C-G formula based on SCr of 0.61 mg/dL). Liver Function Tests: Recent Labs  Lab 12/06/18 2045 12/08/18 0821 12/09/18 0433 12/10/18 0524 12/11/18 0359  AST 28 22 30  41 44*  ALT 29 23 26  37 45*  ALKPHOS 75 66 60 64 74  BILITOT 0.7 0.3 0.2* 0.3 0.5  PROT 7.8 6.5 6.4* 6.6 6.9  ALBUMIN 3.6 2.9* 2.9* 3.0* 3.2*   No results for input(s): LIPASE, AMYLASE in the last 168 hours. Recent Labs  Lab 12/08/18 1650  AMMONIA 12   Coagulation Profile: Recent Labs  Lab  12/05/18 0210  INR 1.1   Cardiac Enzymes: No results for input(s): CKTOTAL, CKMB, CKMBINDEX, TROPONINI in the last 168 hours. BNP (last 3 results) No results for input(s): PROBNP in the last 8760 hours. HbA1C: No results for input(s): HGBA1C in the last 72 hours. CBG: No results for input(s): GLUCAP in the last 168 hours. Lipid Profile: No results for input(s): CHOL, HDL, LDLCALC, TRIG, CHOLHDL, LDLDIRECT in the last 72 hours. Thyroid Function Tests: No results for input(s): TSH, T4TOTAL, FREET4, T3FREE, THYROIDAB in the  last 72 hours. Anemia Panel: Recent Labs    12/09/18 0433 12/09/18 0440  VITAMINB12 617  --   FOLATE  --  10.2  FERRITIN 90  --   TIBC 305  --   IRON 38  --   RETICCTPCT 1.1  --    Sepsis Labs: No results for input(s): PROCALCITON, LATICACIDVEN in the last 168 hours.  Recent Results (from the past 240 hour(s))  SARS CORONAVIRUS 2 Nasal Swab Aptima Multi Swab     Status: None   Collection Time: 12/06/18  8:59 PM   Specimen: Aptima Multi Swab; Nasal Swab  Result Value Ref Range Status   SARS Coronavirus 2 NEGATIVE NEGATIVE Final    Comment: (NOTE) SARS-CoV-2 target nucleic acids are NOT DETECTED. The SARS-CoV-2 RNA is generally detectable in upper and lower respiratory specimens during the acute phase of infection. Negative results do not preclude SARS-CoV-2 infection, do not rule out co-infections with other pathogens, and should not be used as the sole basis for treatment or other patient management decisions. Negative results must be combined with clinical observations, patient history, and epidemiological information. The expected result is Negative. Fact Sheet for Patients: SugarRoll.be Fact Sheet for Healthcare Providers: https://www.woods-mathews.com/ This test is not yet approved or cleared by the [place] FDA and  has been authorized for detection and/or diagnosis of SARS-CoV-2 by FDA under an  Emergency Use Authorization (EUA). This EUA will remain  in effect (meaning this test can be used) for the duration of the COVID-19 declaration under Section 56 4(b)(1) of the Act, 21 U.S.C. section 360bbb-3(b)(1), unless the authorization is terminated or revoked sooner. Performed at [facility], [place] [address] [place], [place] [address]   Urine Culture     Status: Abnormal   Collection Time: 12/06/18  9:38 PM   Specimen: Urine, Random  Result Value Ref Range Status   Specimen Description URINE, RANDOM  Final   Special Requests   Final    NONE Performed at [facility], [place] [address], [place], [place] [address]    Culture >=100,000 COLONIES/mL ESCHERICHIA COLI (A)  Final   Report Status 12/08/2018 FINAL  Final   Organism ID, Bacteria ESCHERICHIA COLI (A)  Final      Susceptibility   Escherichia coli - MIC*    AMPICILLIN >=32 RESISTANT Resistant     CEFAZOLIN <=4 SENSITIVE Sensitive     CEFTRIAXONE <=1 SENSITIVE Sensitive     CIPROFLOXACIN <=0.25 SENSITIVE Sensitive     GENTAMICIN <=1 SENSITIVE Sensitive     IMIPENEM <=0.25 SENSITIVE Sensitive     NITROFURANTOIN <=16 SENSITIVE Sensitive     TRIMETH/SULFA >=320 RESISTANT Resistant     AMPICILLIN/SULBACTAM 4 SENSITIVE Sensitive     PIP/TAZO <=4 SENSITIVE Sensitive     Extended ESBL NEGATIVE Sensitive     * >=100,000 COLONIES/mL ESCHERICHIA COLI    Radiology Studies: No results found.  Scheduled Meds:  cephALEXin  500 mg Oral Q8H   enoxaparin (LOVENOX) injection  40 mg Subcutaneous Q24H   lithium carbonate  450 mg Oral BID   OLANZapine  10 mg Oral QHS   OLANZapine  20 mg Oral QHS   Continuous Infusions:   LOS: 4 days   Kerney Elbe, DO [facility] PAGER is on AMION  If 7PM-7AM, please contact night-coverage [email or web address] Password [facility] 12/11/2018, 3:57 PM

## 2018-12-12 DIAGNOSIS — F05 Delirium due to known physiological condition: Secondary | ICD-10-CM

## 2018-12-12 LAB — COMPREHENSIVE METABOLIC PANEL
ALT: 44 U/L (ref 0–44)
AST: 36 U/L (ref 15–41)
Albumin: 3 g/dL — ABNORMAL LOW (ref 3.5–5.0)
Alkaline Phosphatase: 69 U/L (ref 38–126)
Anion gap: 7 (ref 5–15)
BUN: 8 mg/dL (ref 8–23)
CO2: 24 mmol/L (ref 22–32)
Calcium: 9.5 mg/dL (ref 8.9–10.3)
Chloride: 102 mmol/L (ref 98–111)
Creatinine, Ser: 0.73 mg/dL (ref 0.44–1.00)
GFR calc Af Amer: >60 mL/min (ref >60)
GFR calc non Af Amer: >60 mL/min (ref >60)
Glucose, Bld: 97 mg/dL (ref 70–99)
Potassium: 4 mmol/L (ref 3.5–5.1)
Sodium: 133 mmol/L — ABNORMAL LOW (ref 135–145)
Total Bilirubin: 0.4 mg/dL (ref 0.3–1.2)
Total Protein: 7.2 g/dL (ref 6.5–8.1)

## 2018-12-12 LAB — HEPATITIS PANEL, ACUTE
HCV Ab: >11 s/co ratio — ABNORMAL HIGH (ref 0.0–0.9)
Hep A IgM: NEGATIVE
Hep B C IgM: NEGATIVE
Hepatitis B Surface Ag: NEGATIVE

## 2018-12-12 LAB — PHOSPHORUS: Phosphorus: 4.4 mg/dL (ref 2.5–4.6)

## 2018-12-12 LAB — MAGNESIUM: Magnesium: 1.7 mg/dL (ref 1.7–2.4)

## 2018-12-12 NOTE — Progress Notes (Signed)
  Speech Language Pathology Treatment: Cognitive-Linquistic  Patient Details Name: Karen Best MRN: [id number] DOB: 11/22/1955 Today's Date: 12/12/2018 Time: 9326-7124 SLP Time Calculation (min) (ACUTE ONLY): 21 min  Assessment / Plan / Recommendation Clinical Impression  Pt was seen for cognitive-linguistic treatment session and was cooperative during the session but she requested that it be abbreviated so she could finish watching The Godfather. She achieved 50% accuracy with recall of concrete information from voicemails increasing to 90% with min-mod cues or repetition. She completed word deduction and abstract reasoning tasks with 60% accuracy increasing to 100% with min-mod cues. She required mod cues to recall compensatory strategies for speech intelligibility and demonstrated 60% accuracy with use of these strategies at the sentence level increasing to 100% with mod cues. SLP will continue to follow pt.    HPI HPI: Pt is a 63 y.o. female with medical history significant for schizophrenia and hepatitis C who presented to the ED for evaluation of altered mental status. Pt was initially seen in ED on 12/05/2018 due to reported slurred speech, although there was question about speech abnormality at her baseline. She had a CT head and MRI brain were negative for acute infarct and she was discharged home on that date. She returned to the ED on 12/06/18 secondary to "slow and garbled speech" and lethargy. Pt has been diagnosed with acute metabolic encephalopathy due to UTI.       SLP Plan  Continue with current plan of care       Recommendations                   Follow up Recommendations: 24 hour supervision/assistance;Skilled Nursing facility SLP Visit Diagnosis: Cognitive communication deficit (R41.841);Dysarthria and anarthria (R47.1) Plan: Continue with current plan of care       Karen Best I. Hardin Negus, [place], [place] Office number  [phone] Pager [employer] 12/12/2018, 4:21 PM

## 2018-12-12 NOTE — Progress Notes (Signed)
PROGRESS NOTE    Karen Best  WNU:[id number] DOB: December 19, 1955 DOA: 12/06/2018 PCP: [facility]   Brief Narrative:  The patient is a 63 year old female with a past medical history significant for but not limited to schizophrenia, hepatitis C which was treated with Mavyret, and has other comorbidities who presented secondary to altered mental status and was found to have a E. coli UTI which was sensitive to cephalosporins.  Patient does have a allergy to penicillin but this is low risk and she has tolerated ceftriaxone well.  She improved her mental status but has significantly poor cognition and per her report leaves her stove on at home.  OT and PT evaluated and recommending a skilled nursing facility.  SLP did a cognitive evaluation and feels the patient has moderate dementia and recommends SLP services at a skilled nursing facility.  Patient also has some mild dysarthria and has slower speech.  Will consult social work for skilled nursing facility placement.  Psychiatry consulted for possible paranoid schizophrenia and cleared the patient. Psychiatry feels symptoms could have been related to UTI.  Patient is currently agreeable to SNF however will need a PASRR Number before she can be D/C'd.  Still awaiting SNF placement.  Assessment & Plan:   Principal Problem:   Delirium due to another medical condition Active Problems:   Schizophrenia ([place])   UTI (urinary tract infection)   Acute metabolic encephalopathy   E Coli UTI -UA done showed cloudy appearance with amber color urine, large leukocytes, many bacteria, 0-5 squamous epithelial cells, 6-10 WBCs, as well -Patient empirically started on Ceftriaxone -Urine culture done and showed E. coli greater than 100,000 colonies which was pansensitive and only resistant to Bactrim -Continued Ceftriaxone and transitioned to p.o. Keflex 500 g 3 times daily and will continue for Treatment Course  -PT/OT/SLP evaluation  recommending skilled nursing facility currently -Case management involved and is obtaining a PASAR for D/C  Acute metabolic encephalopathy superimposed on likely chronic dementia, improved  -In setting of infection. -Patient has poor cognition and patient's assisted living facility felt that she was unsafe to return there given that she lives a stove on -SLP and OT evaluated and administered Short Blessed test for memory and cognition she scored a 13 out of 28 and also had a Montreal cognitive assessment 8.1 which she achieved a 13 of 30; SLP continues to follow and recommends continuing with current plan of care -Patient is unsafe to return home and they are recommending skilled nursing facility and Social Work Consulted  -Head CT done without contrast because of her slurred speech showed atrophic changes without acute abnormality -MRI of the brain done without contrast showed no acute intracranial abnormality and normal aging of the brain -Likely patient has dementia and will need formal dementia work-up in the outpatient setting done by neurology -Spoke with neurology and given her continued slurred speech and decreased cognition and Dr. Rory Percy feels that this could be related to her schizophrenia but this is now improved  -We will check a B12 (617), RPR was Non reactive , HIV was nonreactice, ammonia level was 12 and repeat lithium level pending -Repeat UDS Negative  -Patient is stating that her PCP called her to notify of her current STD but no information was gathered so will call PCPs office to verify this; apparently she has a history of doing this and back in June she states that she had an STD and some was told her STD medications but in  the interim we will obtain a GC chlamydia probe and RPR and HIV, all of which have been negative -Got Psychiatry to formally evaluate as this could be some psychosis and paranoia related to her schizophrenia but Dr. Mariea Clonts feels symptoms were from UTI  -Continue to monitor and will need SNF at discharge likely  Schizophrenia -Lithium level on admission was within range and will repeat -Continue with olanzapine 30 mg nightly along with lithium carbonate 450 mg p.o. twice daily -Patient takes Valbenazine Tosylate 80 milligrams p.o. nightly for tardive dyskinesia and will resume -Psychiatry formally evaluated given her paranoia and she is cleared from a Psych perspective and Dr. Mariea Clonts feels that symptoms were related to UTI  Hepatitis C  -S/p Treatment with Mayvret  -Checked LFTs and they were within normal limits and ammonia level is pending given her AMS  Microcytic Anemia -The patient's hemoglobin/hematocrit yesterday was 13.0/42.6 -Checked Anemia Panel and showed an iron level of 38, U IBC of 267, TIBC of 305, saturation ratios of 12%, ferritin level 90, folate level 10.2, vitamin B12 617 -Continue monitor for signs and symptoms of bleeding; currently no overt bleeding noted -Repeat CBC intermittently  Questionable STD, ruled out -Check a GC screen and RPR; both GC and chlamydia screen are negative and RPR was negative -Continue to monitor and will need to verify it with PCP -Patient claims the PCP called her saying that she had an STD yesterday but denies it today; likely this was related to her encephalopathy  Abnormal LFTs, mildly elevated and now trended down -Patient's AST slightly elevated is now 44 and ALT is now 73 yesterday now AST is 36 and ALT is 44 -Check acute hepatitis panel and right upper quadrant ultrasound -Acute hepatitis panel showed hep C antibody greater than 11 consistent with her prior hep C infection; we have not ordered a hep C RNA quant -Liver ultrasound showed "Cholelithiasis without evidence of acute cholecystitis. No bile duct dilatation. Liver appears normal." -Continue monitor and trend Hepatic function -Repeat CMP in a.m.  Metabolic Acidosis -Patient CO2 was 21 and anion gap was 8; chloride was  108; now chloride is 102, CO2 24 and anion gap is 7 -Continue monitor and trend and repeat CMP in the AM  Hyponatremia -Mild sodium was 133 -Continue to monitor and trend -Repeat BMP in the a.m. and if consistently low we will start the patient back on IV fluid hydration with normal saline at 75 mL's per hour  DVT prophylaxis: Enoxaparin 40 mg subcu every 24h Code Status: FULL CODE Family Communication: No family present at bedside  Disposition Plan: SNF when bed available and PASRR obtained   Consultants:   Discussed with Neurology Dr. Rory Percy  Psychiatry    Procedures: None   Antimicrobials:  Anti-infectives (From admission, onward)   Start     Dose/Rate Route Frequency Ordered Stop   12/08/18 1400  cephALEXin (KEFLEX) capsule 500 mg     500 mg Oral Every 8 hours 12/08/18 1036     12/08/18 0000  cephALEXin (KEFLEX) 500 MG capsule     500 mg Oral Every 8 hours 12/08/18 1113 12/13/18 2359   12/06/18 2345  cefTRIAXone (ROCEPHIN) 1 g in sodium chloride 0.9 % 100 mL IVPB  Status:  Discontinued     1 g 200 mL/hr over 30 Minutes Intravenous Every 24 hours 12/06/18 2330 12/08/18 1036   12/06/18 2245  levofloxacin (LEVAQUIN) IVPB 500 mg  Status:  Discontinued     500 mg 100 mL/hr  over 60 Minutes Intravenous  Once 12/06/18 2241 12/06/18 2330     Subjective: Seen and examined at bedside and had no complaints.  Still awaiting SNF bed placement.  No chest pain, lightheadedness or dizziness.  States that she felt okay.  No other concerns or complaints at this time.  Objective: Vitals:   12/11/18 2343 12/12/18 0315 12/12/18 0757 12/12/18 1114  BP: 120/61 (!) 127/59 115/67 118/80  Pulse: 67 61 (!) 57 62  Resp: 18 18 16 18   Temp: 98.1 F (36.7 C) 98 F (36.7 C) 98.2 F (36.8 C) 98.1 F (36.7 C)  TempSrc: Oral Oral Oral Oral  SpO2: 96% 98% 97% 99%  Weight:  58.6 kg    Height:        Intake/Output Summary (Last 24 hours) at 12/12/2018 1322 Last data filed at 12/12/2018 1000  Gross per 24 hour  Intake 1190 ml  Output 1300 ml  Net -110 ml   Filed Weights   12/10/18 0500 12/11/18 0500 12/12/18 0315  Weight: 59.2 kg 58.9 kg 58.6 kg   Examination: Physical Exam:  Constitutional: Thin African-American female currently no acute distress and she appears calm and comfortable and is much more awake and alert today. Respiratory: Slight diminished auscultation bilaterally with no appreciable wheezing, rales rhonchi. Cardiovascular: Regular rate and rhythm.  No appreciable murmurs, rubs, gallops. Abdomen: Soft, nontender, nondistended.  Bowel sounds present GU: Deferred Neurologic: Cranial nerves II through XII gross intact no appreciable focal deficits Psychiatric:, Alert, oriented and has a normal mood and affect  Data Reviewed: I have personally reviewed following labs and imaging studies  CBC: Recent Labs  Lab 12/06/18 2045 12/07/18 0348 12/08/18 0821 12/09/18 0433 12/10/18 0524 12/11/18 0359  WBC 7.6 6.9 6.4 6.2 6.7 7.0  NEUTROABS 3.5  --  2.4 2.3 2.8 2.9  HGB 12.8 11.5* 11.6* 11.4* 11.8* 13.0  HCT 42.6 37.4 37.3 38.2 39.3 42.6  MCV 75.3* 74.9* 75.2* 77.0* 76.2* 75.4*  PLT 292 280 266 284 312 161   Basic Metabolic Panel: Recent Labs  Lab 12/08/18 0821 12/09/18 0433 12/10/18 0524 12/11/18 0359 12/12/18 0528  NA 137 138 137 137 133*  K 4.1 3.9 4.1 3.8 4.0  CL 110 107 108 108 102  CO2 22 25 23  21* 24  GLUCOSE 94 91 104* 109* 97  BUN 5* 6* 7* 6* 8  CREATININE 0.75 0.84 0.77 0.61 0.73  CALCIUM 9.1 9.3 9.4 9.9 9.5  MG 1.8 1.9 1.8 1.9 1.7  PHOS 3.0 3.5 3.6 4.2 4.4   GFR: Estimated Creatinine Clearance: 65.6 mL/min (by C-G formula based on SCr of 0.73 mg/dL). Liver Function Tests: Recent Labs  Lab 12/08/18 0821 12/09/18 0433 12/10/18 0524 12/11/18 0359 12/12/18 0528  AST 22 30 41 44* 36  ALT 23 26 37 45* 44  ALKPHOS 66 60 64 74 69  BILITOT 0.3 0.2* 0.3 0.5 0.4  PROT 6.5 6.4* 6.6 6.9 7.2  ALBUMIN 2.9* 2.9* 3.0* 3.2* 3.0*   No  results for input(s): LIPASE, AMYLASE in the last 168 hours. Recent Labs  Lab 12/08/18 1650  AMMONIA 12   Coagulation Profile: No results for input(s): INR, PROTIME in the last 168 hours. Cardiac Enzymes: No results for input(s): CKTOTAL, CKMB, CKMBINDEX, TROPONINI in the last 168 hours. BNP (last 3 results) No results for input(s): PROBNP in the last 8760 hours. HbA1C: No results for input(s): HGBA1C in the last 72 hours. CBG: No results for input(s): GLUCAP in the last 168 hours. Lipid Profile:  No results for input(s): CHOL, HDL, LDLCALC, TRIG, CHOLHDL, LDLDIRECT in the last 72 hours. Thyroid Function Tests: No results for input(s): TSH, T4TOTAL, FREET4, T3FREE, THYROIDAB in the last 72 hours. Anemia Panel: No results for input(s): VITAMINB12, FOLATE, FERRITIN, TIBC, IRON, RETICCTPCT in the last 72 hours. Sepsis Labs: No results for input(s): PROCALCITON, LATICACIDVEN in the last 168 hours.  Recent Results (from the past 240 hour(s))  SARS CORONAVIRUS 2 Nasal Swab Aptima Multi Swab     Status: None   Collection Time: 12/06/18  8:59 PM   Specimen: Aptima Multi Swab; Nasal Swab  Result Value Ref Range Status   SARS Coronavirus 2 NEGATIVE NEGATIVE Final    Comment: (NOTE) SARS-CoV-2 target nucleic acids are NOT DETECTED. The SARS-CoV-2 RNA is generally detectable in upper and lower respiratory specimens during the acute phase of infection. Negative results do not preclude SARS-CoV-2 infection, do not rule out co-infections with other pathogens, and should not be used as the sole basis for treatment or other patient management decisions. Negative results must be combined with clinical observations, patient history, and epidemiological information. The expected result is Negative. Fact Sheet for Patients: SugarRoll.be Fact Sheet for Healthcare Providers: https://www.woods-mathews.com/ This test is not yet approved or cleared by the  [place] FDA and  has been authorized for detection and/or diagnosis of SARS-CoV-2 by FDA under an Emergency Use Authorization (EUA). This EUA will remain  in effect (meaning this test can be used) for the duration of the COVID-19 declaration under Section 56 4(b)(1) of the Act, 21 U.S.C. section 360bbb-3(b)(1), unless the authorization is terminated or revoked sooner. Performed at [facility], [place] [address], [place], [place] [address]   Urine Culture     Status: Abnormal   Collection Time: 12/06/18  9:38 PM   Specimen: Urine, Random  Result Value Ref Range Status   Specimen Description URINE, RANDOM  Final   Special Requests   Final    NONE Performed at [facility], [place] [address], [place], [place] [address]    Culture >=100,000 COLONIES/mL ESCHERICHIA COLI (A)  Final   Report Status 12/08/2018 FINAL  Final   Organism ID, Bacteria ESCHERICHIA COLI (A)  Final      Susceptibility   Escherichia coli - MIC*    AMPICILLIN >=32 RESISTANT Resistant     CEFAZOLIN <=4 SENSITIVE Sensitive     CEFTRIAXONE <=1 SENSITIVE Sensitive     CIPROFLOXACIN <=0.25 SENSITIVE Sensitive     GENTAMICIN <=1 SENSITIVE Sensitive     IMIPENEM <=0.25 SENSITIVE Sensitive     NITROFURANTOIN <=16 SENSITIVE Sensitive     TRIMETH/SULFA >=320 RESISTANT Resistant     AMPICILLIN/SULBACTAM 4 SENSITIVE Sensitive     PIP/TAZO <=4 SENSITIVE Sensitive     Extended ESBL NEGATIVE Sensitive     * >=100,000 COLONIES/mL ESCHERICHIA COLI    Radiology Studies: US Abdomen Limited Ruq  Result Date: 12/11/2018 CLINICAL DATA:  Abnormal liver function test today. History of gallstones. EXAM: ULTRASOUND ABDOMEN LIMITED RIGHT UPPER QUADRANT COMPARISON:  Ultrasound dated 10/13/2017. FINDINGS: Gallbladder: Again noted are multiple gallstones, largest measuring 4 mm. No gallbladder wall thickening or pericholecystic fluid. No sonographic Murphy's sign elicited during the exam, per the sonographer Common bile duct:  Diameter: 3 mm Liver: No focal lesion identified. Within normal limits in parenchymal echogenicity. Portal vein is patent on color Doppler imaging with normal direction of blood flow towards the liver. Other: None. IMPRESSION: 1. Cholelithiasis without evidence of acute cholecystitis. 2. No  bile duct dilatation. 3. Liver appears normal. Electronically Signed   By: Franki Cabot M.D.   On: 12/11/2018 20:15    Scheduled Meds: . cephALEXin  500 mg Oral Q8H  . enoxaparin (LOVENOX) injection  40 mg Subcutaneous Q24H  . lithium carbonate  450 mg Oral BID  . OLANZapine  10 mg Oral QHS  . OLANZapine  20 mg Oral QHS   Continuous Infusions:   LOS: 5 days   Kerney Elbe, DO [facility] PAGER is on [place]  If 7PM-7AM, please contact night-coverage [email or web address] Password [email or web address] 12/12/2018, 1:22 PM

## 2018-12-13 DIAGNOSIS — B9620 Unspecified Escherichia coli [E. coli] as the cause of diseases classified elsewhere: Secondary | ICD-10-CM

## 2018-12-13 DIAGNOSIS — N390 Urinary tract infection, site not specified: Principal | ICD-10-CM

## 2018-12-13 NOTE — Progress Notes (Signed)
PROGRESS NOTE    NERINE PULSE  WUJ:[id number] DOB: November 27, 1955 DOA: 12/06/2018 PCP: [facility]   Brief Narrative:  The patient is a 63 year old female with a past medical history significant for but not limited to schizophrenia, hepatitis C which was treated with Mavyret, and has other comorbidities who presented secondary to altered mental status and was found to have a E. coli UTI which was sensitive to cephalosporins.  Patient does have a allergy to penicillin but this is low risk and she has tolerated ceftriaxone well.  She improved her mental status but has significantly poor cognition and per her report leaves her stove on at home.  OT and PT evaluated and recommending a skilled nursing facility.  SLP did a cognitive evaluation and feels the patient has moderate dementia and recommends SLP services at a skilled nursing facility.  Patient also has some mild dysarthria and has slower speech.  Will consult social work for skilled nursing facility placement.  Psychiatry consulted for possible paranoid schizophrenia and cleared the patient. Psychiatry feels symptoms could have been related to UTI.  Patient is currently agreeable to SNF however will need a PASRR Number before she can be D/C'd.  Still awaiting SNF placement.  Assessment & Plan:   Principal Problem:   Delirium due to another medical condition Active Problems:   Schizophrenia ([place])   UTI (urinary tract infection)   Acute metabolic encephalopathy   E Coli UTI -UA done showed cloudy appearance with amber color urine, large leukocytes, many bacteria, 0-5 squamous epithelial cells, 6-10 WBCs, as well -Patient empirically started on Ceftriaxone -Urine culture done and showed E. coli greater than 100,000 colonies which was pansensitive and only resistant to Bactrim -Continued Ceftriaxone and transitioned to p.o. Keflex 500 g 3 times daily and will continue for Treatment Course; today's last day of  treatment and will stop after tonight's dose -PT/OT/SLP evaluation recommending skilled nursing facility currently -Case management/social worker involved and is obtaining a PASAR for D/C; catheter still pending and currently has no bed offers per my discussion with social work this morning  Acute metabolic encephalopathy superimposed on likely chronic dementia, improved  -In setting of infection. -Patient has poor cognition and patient's assisted living facility felt that she was unsafe to return there given that she lives a stove on -SLP and OT evaluated and administered Short Blessed test for memory and cognition she scored a 13 out of 28 and also had a Montreal cognitive assessment 8.1 which she achieved a 13 of 30; SLP continues to follow and recommends continuing with current plan of care -Patient is unsafe to return home and they are recommending skilled nursing facility and Social Work Consulted  -Head CT done without contrast because of her slurred speech showed atrophic changes without acute abnormality -MRI of the brain done without contrast showed no acute intracranial abnormality and normal aging of the brain -Likely patient has dementia and will need formal dementia work-up in the outpatient setting done by neurology -Spoke with neurology and given her continued slurred speech and decreased cognition and Dr. Rory Percy feels that this could be related to her schizophrenia but this is now improved  -We will check a B12 (617), RPR was Non reactive , HIV was nonreactice, ammonia level was 12 and repeat lithium level pending -Repeat UDS Negative  -Patient is stating that her PCP called her to notify of her current STD but no information was gathered so will call PCPs office to verify this;  apparently she has a history of doing this and back in June she states that she had an STD and some was told her STD medications but in the interim we will obtain a GC chlamydia probe and RPR and HIV, all of  which have been negative -Got Psychiatry to formally evaluate as this could be some psychosis and paranoia related to her schizophrenia but Dr. Mariea Clonts feels symptoms were from UTI -Continue to monitor and will need SNF at discharge likely  Schizophrenia -Lithium level on admission was within range and will repeat -Continue with olanzapine 30 mg nightly along with lithium carbonate 450 mg p.o. twice daily -Patient takes Valbenazine Tosylate 80 milligrams p.o. nightly for tardive dyskinesia and will resume -Psychiatry formally evaluated given her paranoia and she is cleared from a Psych perspective and Dr. Mariea Clonts feels that symptoms were related to UTI  Hepatitis C  -S/p Treatment with Mayvret  -Checked LFTs and they were within normal limits and ammonia level is pending given her AMS  Microcytic Anemia -The patient's hemoglobin/hematocrit day before yesterday was 13.0/42.6 -Checked Anemia Panel and showed an iron level of 38, U IBC of 267, TIBC of 305, saturation ratios of 12%, ferritin level 90, folate level 10.2, vitamin B12 617 -Continue monitor for signs and symptoms of bleeding; currently no overt bleeding noted -Repeat CBC intermittently  Questionable STD, ruled out -Check a GC screen and RPR; both GC and chlamydia screen are negative and RPR was negative -Continue to monitor and will need to verify it with PCP -Patient claims the PCP called her saying that she had an STD yesterday but denies it today; likely this was related to her encephalopathy  Abnormal LFTs, mildly elevated and now trended down -Patient's AST slightly elevated is now 44 and ALT is now 45 the day before yesterday; yesterday AST was 36 and ALT was 44 -Check acute hepatitis panel and right upper quadrant ultrasound -Acute hepatitis panel showed hep C antibody greater than 11 consistent with her prior hep C infection; we have not ordered a hep C RNA quant -Liver ultrasound showed "Cholelithiasis without evidence  of acute cholecystitis. No bile duct dilatation. Liver appears normal." -Continue monitor and trend Hepatic function -Repeat CMP in a.m.  Metabolic Acidosis, improved -Patient CO2 was 21 and anion gap was 8; chloride was 108; yesterday chloride was 102, CO2 24 and anion gap was 7 -Continue monitor and trend and repeat CMP in the AM  Hyponatremia -Mild sodium was 133 yesterday -Continue to monitor and trend intermittently -Repeat BMP in the next few days if she is still hospitalized  DVT prophylaxis: Enoxaparin 40 mg subcu every 24h Code Status: FULL CODE Family Communication: No family present at bedside  Disposition Plan: SNF when bed available and PASRR obtained   Consultants:   Discussed with Neurology Dr. Rory Percy  Psychiatry    Procedures: None   Antimicrobials:  Anti-infectives (From admission, onward)   Start     Dose/Rate Route Frequency Ordered Stop   12/08/18 1400  cephALEXin (KEFLEX) capsule 500 mg     500 mg Oral Every 8 hours 12/08/18 1036 12/14/18 0559   12/08/18 0000  cephALEXin (KEFLEX) 500 MG capsule     500 mg Oral Every 8 hours 12/08/18 1113 12/13/18 2359   12/06/18 2345  cefTRIAXone (ROCEPHIN) 1 g in sodium chloride 0.9 % 100 mL IVPB  Status:  Discontinued     1 g 200 mL/hr over 30 Minutes Intravenous Every 24 hours 12/06/18 2330 12/08/18 1036  12/06/18 2245  levofloxacin (LEVAQUIN) IVPB 500 mg  Status:  Discontinued     500 mg 100 mL/hr over 60 Minutes Intravenous  Once 12/06/18 2241 12/06/18 2330     Subjective: Seen and examined at bedside states that she is feeling good and asking about update about SNF bed placement and when she can go home.  No chest pain, lightheadedness or dizziness.  No nausea or vomiting.  No other concerns or complaints at this time.  Objective: Vitals:   12/12/18 2339 12/13/18 0308 12/13/18 0500 12/13/18 0851  BP: 132/74 124/85  117/75  Pulse: 68 72  62  Resp: 16 17  15   Temp: 97.6 F (36.4 C) 98 F (36.7 C)  98.3 F  (36.8 C)  TempSrc: Oral   Oral  SpO2: 100% 99%  100%  Weight:   61.6 kg   Height:        Intake/Output Summary (Last 24 hours) at 12/13/2018 1152 Last data filed at 12/13/2018 1034 Gross per 24 hour  Intake 600 ml  Output 2600 ml  Net -2000 ml   Filed Weights   12/11/18 0500 12/12/18 0315 12/13/18 0500  Weight: 58.9 kg 58.6 kg 61.6 kg   Examination: Physical Exam:  Constitutional: Thin African-American female currently no acute distress and she appears calm and she appears comfortable. Respiratory: Mildly diminished auscultation bilateral no appreciable wheezing, rales, rhonchi Cardiovascular: Regular rate and rhythm.  No appreciable murmurs, rubs, gallops Abdomen: Soft, nontender, nondistended.  Bowel sounds present GU: Deferred Neurologic: Cranial nerves II through XII grossly intact no appreciable focal Psychiatric: Patient is awake, alert, oriented has a normal mood and affect.  Data Reviewed: I have personally reviewed following labs and imaging studies  CBC: Recent Labs  Lab 12/06/18 2045 12/07/18 0348 12/08/18 0821 12/09/18 0433 12/10/18 0524 12/11/18 0359  WBC 7.6 6.9 6.4 6.2 6.7 7.0  NEUTROABS 3.5  --  2.4 2.3 2.8 2.9  HGB 12.8 11.5* 11.6* 11.4* 11.8* 13.0  HCT 42.6 37.4 37.3 38.2 39.3 42.6  MCV 75.3* 74.9* 75.2* 77.0* 76.2* 75.4*  PLT 292 280 266 284 312 355   Basic Metabolic Panel: Recent Labs  Lab 12/08/18 0821 12/09/18 0433 12/10/18 0524 12/11/18 0359 12/12/18 0528  NA 137 138 137 137 133*  K 4.1 3.9 4.1 3.8 4.0  CL 110 107 108 108 102  CO2 22 25 23  21* 24  GLUCOSE 94 91 104* 109* 97  BUN 5* 6* 7* 6* 8  CREATININE 0.75 0.84 0.77 0.61 0.73  CALCIUM 9.1 9.3 9.4 9.9 9.5  MG 1.8 1.9 1.8 1.9 1.7  PHOS 3.0 3.5 3.6 4.2 4.4   GFR: Estimated Creatinine Clearance: 65.6 mL/min (by C-G formula based on SCr of 0.73 mg/dL). Liver Function Tests: Recent Labs  Lab 12/08/18 0821 12/09/18 0433 12/10/18 0524 12/11/18 0359 12/12/18 0528  AST 22 30  41 44* 36  ALT 23 26 37 45* 44  ALKPHOS 66 60 64 74 69  BILITOT 0.3 0.2* 0.3 0.5 0.4  PROT 6.5 6.4* 6.6 6.9 7.2  ALBUMIN 2.9* 2.9* 3.0* 3.2* 3.0*   No results for input(s): LIPASE, AMYLASE in the last 168 hours. Recent Labs  Lab 12/08/18 1650  AMMONIA 12   Coagulation Profile: No results for input(s): INR, PROTIME in the last 168 hours. Cardiac Enzymes: No results for input(s): CKTOTAL, CKMB, CKMBINDEX, TROPONINI in the last 168 hours. BNP (last 3 results) No results for input(s): PROBNP in the last 8760 hours. HbA1C: No results for input(s): HGBA1C  in the last 72 hours. CBG: No results for input(s): GLUCAP in the last 168 hours. Lipid Profile: No results for input(s): CHOL, HDL, LDLCALC, TRIG, CHOLHDL, LDLDIRECT in the last 72 hours. Thyroid Function Tests: No results for input(s): TSH, T4TOTAL, FREET4, T3FREE, THYROIDAB in the last 72 hours. Anemia Panel: No results for input(s): VITAMINB12, FOLATE, FERRITIN, TIBC, IRON, RETICCTPCT in the last 72 hours. Sepsis Labs: No results for input(s): PROCALCITON, LATICACIDVEN in the last 168 hours.  Recent Results (from the past 240 hour(s))  SARS CORONAVIRUS 2 Nasal Swab Aptima Multi Swab     Status: None   Collection Time: 12/06/18  8:59 PM   Specimen: Aptima Multi Swab; Nasal Swab  Result Value Ref Range Status   SARS Coronavirus 2 NEGATIVE NEGATIVE Final    Comment: (NOTE) SARS-CoV-2 target nucleic acids are NOT DETECTED. The SARS-CoV-2 RNA is generally detectable in upper and lower respiratory specimens during the acute phase of infection. Negative results do not preclude SARS-CoV-2 infection, do not rule out co-infections with other pathogens, and should not be used as the sole basis for treatment or other patient management decisions. Negative results must be combined with clinical observations, patient history, and epidemiological information. The expected result is Negative. Fact Sheet for Patients:  SugarRoll.be Fact Sheet for Healthcare Providers: https://www.woods-mathews.com/ This test is not yet approved or cleared by the [place] FDA and  has been authorized for detection and/or diagnosis of SARS-CoV-2 by FDA under an Emergency Use Authorization (EUA). This EUA will remain  in effect (meaning this test can be used) for the duration of the COVID-19 declaration under Section 56 4(b)(1) of the Act, 21 U.S.C. section 360bbb-3(b)(1), unless the authorization is terminated or revoked sooner. Performed at [facility], [place] [address] [place], [place] [address]   Urine Culture     Status: Abnormal   Collection Time: 12/06/18  9:38 PM   Specimen: Urine, Random  Result Value Ref Range Status   Specimen Description URINE, RANDOM  Final   Special Requests   Final    NONE Performed at [facility], [place] [address] [place], [place] [address]    Culture >=100,000 COLONIES/mL ESCHERICHIA COLI (A)  Final   Report Status 12/08/2018 FINAL  Final   Organism ID, Bacteria ESCHERICHIA COLI (A)  Final      Susceptibility   Escherichia coli - MIC*    AMPICILLIN >=32 RESISTANT Resistant     CEFAZOLIN <=4 SENSITIVE Sensitive     CEFTRIAXONE <=1 SENSITIVE Sensitive     CIPROFLOXACIN <=0.25 SENSITIVE Sensitive     GENTAMICIN <=1 SENSITIVE Sensitive     IMIPENEM <=0.25 SENSITIVE Sensitive     NITROFURANTOIN <=16 SENSITIVE Sensitive     TRIMETH/SULFA >=320 RESISTANT Resistant     AMPICILLIN/SULBACTAM 4 SENSITIVE Sensitive     PIP/TAZO <=4 SENSITIVE Sensitive     Extended ESBL NEGATIVE Sensitive     * >=100,000 COLONIES/mL ESCHERICHIA COLI    Radiology Studies: US Abdomen Limited Ruq  Result Date: 12/11/2018 CLINICAL DATA:  Abnormal liver function test today. History of gallstones. EXAM: ULTRASOUND ABDOMEN LIMITED RIGHT UPPER QUADRANT COMPARISON:  Ultrasound dated 10/13/2017. FINDINGS: Gallbladder: Again noted are multiple gallstones,  largest measuring 4 mm. No gallbladder wall thickening or pericholecystic fluid. No sonographic Murphy's sign elicited during the exam, per the sonographer Common bile duct: Diameter: 3 mm Liver: No focal lesion identified. Within normal limits in parenchymal echogenicity. Portal vein is patent on color Doppler imaging with normal direction  of blood flow towards the liver. Other: None. IMPRESSION: 1. Cholelithiasis without evidence of acute cholecystitis. 2. No bile duct dilatation. 3. Liver appears normal. Electronically Signed   By: Franki Cabot M.D.   On: 12/11/2018 20:15    Scheduled Meds: . cephALEXin  500 mg Oral Q8H  . enoxaparin (LOVENOX) injection  40 mg Subcutaneous Q24H  . lithium carbonate  450 mg Oral BID  . OLANZapine  10 mg Oral QHS  . OLANZapine  20 mg Oral QHS   Continuous Infusions:   LOS: 6 days   Kerney Elbe, DO [facility] PAGER is on AMION  If 7PM-7AM, please contact night-coverage [email or web address] Password [facility] 12/13/2018, 11:52 AM

## 2018-12-14 NOTE — Progress Notes (Signed)
PROGRESS NOTE    Karen Best  QQV:[id number] DOB: Sep 18, 1955 DOA: 12/06/2018 PCP: [facility]   Brief Narrative:  The patient is a 63 year old female with a past medical history significant for but not limited to schizophrenia, hepatitis C which was treated with Mavyret, and has other comorbidities who presented secondary to altered mental status and was found to have a E. coli UTI which was sensitive to cephalosporins.  Patient does have a allergy to penicillin but this is low risk and she has tolerated ceftriaxone well.  She improved her mental status but has significantly poor cognition and per her report leaves her stove on at home.  OT and PT evaluated and recommending a skilled nursing facility.  SLP did a cognitive evaluation and feels the patient has moderate dementia and recommends SLP services at a skilled nursing facility.  Patient also has some mild dysarthria and has slower speech.  Will consult social work for skilled nursing facility placement.  Psychiatry consulted for possible paranoid schizophrenia and cleared the patient. Psychiatry feels symptoms could have been related to UTI.  Patient is currently agreeable to SNF however will need a PASRR Number before she can be D/C'd.  Still awaiting SNF placement and PT continuing to recommend SNF.  Assessment & Plan:   Principal Problem:   Delirium due to another medical condition Active Problems:   Schizophrenia ([place])   UTI (urinary tract infection)   Acute metabolic encephalopathy   E Coli UTI -UA done showed cloudy appearance with amber color urine, large leukocytes, many bacteria, 0-5 squamous epithelial cells, 6-10 WBCs, as well -Patient empirically started on Ceftriaxone -Urine culture done and showed E. coli greater than 100,000 colonies which was pansensitive and only resistant to Bactrim -Continued Ceftriaxone and transitioned to p.o. Keflex 500 g 3 times daily and will continue for  Treatment Course; today's last day of treatment and will stop after tonight's dose -PT/OT/SLP evaluation recommending skilled nursing facility currently -Case management/social worker involved and is obtaining a PASAR for D/C; Still pending and currently has no bed offers per my discussion with social work this morning and PT continuing to recommend SNF  Acute metabolic encephalopathy superimposed on likely chronic dementia, improved  -In setting of infection. -Patient has poor cognition and patient's assisted living facility felt that she was unsafe to return there given that she lives a stove on -SLP and OT evaluated and administered Short Blessed test for memory and cognition she scored a 13 out of 28 and also had a Montreal cognitive assessment 8.1 which she achieved a 13 of 30; SLP continues to follow and recommends continuing with current plan of care -Patient is unsafe to return home and they are recommending skilled nursing facility and Social Work Consulted  -Head CT done without contrast because of her slurred speech showed atrophic changes without acute abnormality -MRI of the brain done without contrast showed no acute intracranial abnormality and normal aging of the brain -Likely patient has dementia and will need formal dementia work-up in the outpatient setting done by neurology -Spoke with neurology and given her continued slurred speech and decreased cognition and Dr. Rory Percy feels that this could be related to her schizophrenia but this is now improved  -We will check a B12 (617), RPR was Non reactive , HIV was nonreactice, ammonia level was 12 and repeat lithium level pending -Repeat UDS Negative  -Patient is stating that her PCP called her to notify of her current STD but no  information was gathered so will call PCPs office to verify this; apparently she has a history of doing this and back in June she states that she had an STD and some was told her STD medications but in the  interim we will obtain a GC chlamydia probe and RPR and HIV, all of which have been negative -Got Psychiatry to formally evaluate as this could be some psychosis and paranoia related to her schizophrenia but Dr. Mariea Clonts feels symptoms were from UTI -Continue to monitor and will need SNF at discharge likely  Schizophrenia -Lithium level on admission was within range and will repeat -Continue with olanzapine 30 mg nightly along with lithium carbonate 450 mg p.o. twice daily -Patient takes Valbenazine Tosylate 80 milligrams p.o. nightly for tardive dyskinesia and will resume -Psychiatry formally evaluated given her paranoia and she is cleared from a Psych perspective and Dr. Mariea Clonts feels that symptoms were related to UTI  Hepatitis C  -S/p Treatment with Mayvret  -Checked LFTs and they were within normal limits and ammonia level is pending given her AMS  Microcytic Anemia -The patient's hemoglobin/hematocrit was 13.0/42.6 on 12/11/2018 -Checked Anemia Panel and showed an iron level of 38, U IBC of 267, TIBC of 305, saturation ratios of 12%, ferritin level 90, folate level 10.2, vitamin B12 617 -Continue monitor for signs and symptoms of bleeding; currently no overt bleeding noted -Repeat CBC intermittently  Questionable STD, ruled out -Check a GC screen and RPR; both GC and chlamydia screen are negative and RPR was negative -Continue to monitor and will need to verify it with PCP -Patient claims the PCP called her saying that she had an STD yesterday but denies it today; likely this was related to her encephalopathy  Abnormal LFTs, mildly elevated and now trended down -Patient's AST is now 36 and ALT is now 44 -Check acute hepatitis panel and right upper quadrant ultrasound -Acute hepatitis panel showed hep C antibody greater than 11 consistent with her prior hep C infection; we have not ordered a hep C RNA quant -Liver ultrasound showed "Cholelithiasis without evidence of acute  cholecystitis. No bile duct dilatation. Liver appears normal." -Continue monitor and trend Hepatic function -Repeat CMP in a.m.  Metabolic Acidosis, improved -Patient CO2 was 21 and anion gap was 8; chloride was 108; the day before yesterday chloride was 102, CO2 24 and anion gap was 7 -Continue monitor and trend and repeat CMP in the AM  Hyponatremia -Mild sodium was 133 the day before yesterday -Continue to monitor and trend intermittently -Repeat BMP in the next few days if she is still hospitalized  DVT prophylaxis: Enoxaparin 40 mg subcu every 24h Code Status: FULL CODE Family Communication: No family present at bedside  Disposition Plan: SNF when bed available and PASRR obtained   Consultants:   Discussed with Neurology Dr. Rory Percy  Psychiatry    Procedures: None   Antimicrobials:  Anti-infectives (From admission, onward)   Start     Dose/Rate Route Frequency Ordered Stop   12/08/18 1400  cephALEXin (KEFLEX) capsule 500 mg     500 mg Oral Every 8 hours 12/08/18 1036 12/13/18 2143   12/08/18 0000  cephALEXin (KEFLEX) 500 MG capsule     500 mg Oral Every 8 hours 12/08/18 1113 12/13/18 2359   12/06/18 2345  cefTRIAXone (ROCEPHIN) 1 g in sodium chloride 0.9 % 100 mL IVPB  Status:  Discontinued     1 g 200 mL/hr over 30 Minutes Intravenous Every 24 hours 12/06/18 2330 12/08/18  1036   12/06/18 2245  levofloxacin (LEVAQUIN) IVPB 500 mg  Status:  Discontinued     500 mg 100 mL/hr over 60 Minutes Intravenous  Once 12/06/18 2241 12/06/18 2330     Subjective: Seen and examined at bedside   Objective: Vitals:   12/14/18 0406 12/14/18 0459 12/14/18 0851 12/14/18 1220  BP: 122/73  120/78 116/78  Pulse: (!) 59  62 68  Resp: 17  13 17   Temp: 97.7 F (36.5 C)  97.9 F (36.6 C) 98.3 F (36.8 C)  TempSrc: Oral  Oral Oral  SpO2: 100%  100% 98%  Weight:  62.4 kg    Height:        Intake/Output Summary (Last 24 hours) at 12/14/2018 1521 Last data filed at 12/14/2018  0810 Gross per 24 hour  Intake 780 ml  Output 2025 ml  Net -1245 ml   Filed Weights   12/12/18 0315 12/13/18 0500 12/14/18 0459  Weight: 58.6 kg 61.6 kg 62.4 kg   Examination: Physical Exam:  Constitutional: Thin African-American female currently no acute distress appears calm but is frustrated and wanting to know when she is going to get a bed SNF Respiratory: Diminished auscultation bilaterally no appreciable wheezing, rales, rhonchi.  Patient has unlabored breathing Cardiovascular: Regular rate and rhythm.  No appreciable murmurs, rubs, gallops Abdomen: Soft, nontender, nondistended backslash present GU: Deferred Neurologic: Cranial nerves II through XII grossly intact no appreciable focal deficits Psychiatric: Awake and alert and slightly frustrated today  Data Reviewed: I have personally reviewed following labs and imaging studies  CBC: Recent Labs  Lab 12/08/18 0821 12/09/18 0433 12/10/18 0524 12/11/18 0359  WBC 6.4 6.2 6.7 7.0  NEUTROABS 2.4 2.3 2.8 2.9  HGB 11.6* 11.4* 11.8* 13.0  HCT 37.3 38.2 39.3 42.6  MCV 75.2* 77.0* 76.2* 75.4*  PLT 266 284 312 465   Basic Metabolic Panel: Recent Labs  Lab 12/08/18 0821 12/09/18 0433 12/10/18 0524 12/11/18 0359 12/12/18 0528  NA 137 138 137 137 133*  K 4.1 3.9 4.1 3.8 4.0  CL 110 107 108 108 102  CO2 22 25 23  21* 24  GLUCOSE 94 91 104* 109* 97  BUN 5* 6* 7* 6* 8  CREATININE 0.75 0.84 0.77 0.61 0.73  CALCIUM 9.1 9.3 9.4 9.9 9.5  MG 1.8 1.9 1.8 1.9 1.7  PHOS 3.0 3.5 3.6 4.2 4.4   GFR: Estimated Creatinine Clearance: 65.6 mL/min (by C-G formula based on SCr of 0.73 mg/dL). Liver Function Tests: Recent Labs  Lab 12/08/18 0821 12/09/18 0433 12/10/18 0524 12/11/18 0359 12/12/18 0528  AST 22 30 41 44* 36  ALT 23 26 37 45* 44  ALKPHOS 66 60 64 74 69  BILITOT 0.3 0.2* 0.3 0.5 0.4  PROT 6.5 6.4* 6.6 6.9 7.2  ALBUMIN 2.9* 2.9* 3.0* 3.2* 3.0*   No results for input(s): LIPASE, AMYLASE in the last 168  hours. Recent Labs  Lab 12/08/18 1650  AMMONIA 12   Coagulation Profile: No results for input(s): INR, PROTIME in the last 168 hours. Cardiac Enzymes: No results for input(s): CKTOTAL, CKMB, CKMBINDEX, TROPONINI in the last 168 hours. BNP (last 3 results) No results for input(s): PROBNP in the last 8760 hours. HbA1C: No results for input(s): HGBA1C in the last 72 hours. CBG: No results for input(s): GLUCAP in the last 168 hours. Lipid Profile: No results for input(s): CHOL, HDL, LDLCALC, TRIG, CHOLHDL, LDLDIRECT in the last 72 hours. Thyroid Function Tests: No results for input(s): TSH, T4TOTAL, FREET4, T3FREE, THYROIDAB  in the last 72 hours. Anemia Panel: No results for input(s): VITAMINB12, FOLATE, FERRITIN, TIBC, IRON, RETICCTPCT in the last 72 hours. Sepsis Labs: No results for input(s): PROCALCITON, LATICACIDVEN in the last 168 hours.  Recent Results (from the past 240 hour(s))  SARS CORONAVIRUS 2 Nasal Swab Aptima Multi Swab     Status: None   Collection Time: 12/06/18  8:59 PM   Specimen: Aptima Multi Swab; Nasal Swab  Result Value Ref Range Status   SARS Coronavirus 2 NEGATIVE NEGATIVE Final    Comment: (NOTE) SARS-CoV-2 target nucleic acids are NOT DETECTED. The SARS-CoV-2 RNA is generally detectable in upper and lower respiratory specimens during the acute phase of infection. Negative results do not preclude SARS-CoV-2 infection, do not rule out co-infections with other pathogens, and should not be used as the sole basis for treatment or other patient management decisions. Negative results must be combined with clinical observations, patient history, and epidemiological information. The expected result is Negative. Fact Sheet for Patients: SugarRoll.be Fact Sheet for Healthcare Providers: https://www.woods-mathews.com/ This test is not yet approved or cleared by the [place] FDA and  has been authorized for detection  and/or diagnosis of SARS-CoV-2 by FDA under an Emergency Use Authorization (EUA). This EUA will remain  in effect (meaning this test can be used) for the duration of the COVID-19 declaration under Section 56 4(b)(1) of the Act, 21 U.S.C. section 360bbb-3(b)(1), unless the authorization is terminated or revoked sooner. Performed at [facility], [place] [address] [place], [place] [address]   Urine Culture     Status: Abnormal   Collection Time: 12/06/18  9:38 PM   Specimen: Urine, Random  Result Value Ref Range Status   Specimen Description URINE, RANDOM  Final   Special Requests   Final    NONE Performed at [facility], [place] [address] [place], [place] [address]    Culture >=100,000 COLONIES/mL ESCHERICHIA COLI (A)  Final   Report Status 12/08/2018 FINAL  Final   Organism ID, Bacteria ESCHERICHIA COLI (A)  Final      Susceptibility   Escherichia coli - MIC*    AMPICILLIN >=32 RESISTANT Resistant     CEFAZOLIN <=4 SENSITIVE Sensitive     CEFTRIAXONE <=1 SENSITIVE Sensitive     CIPROFLOXACIN <=0.25 SENSITIVE Sensitive     GENTAMICIN <=1 SENSITIVE Sensitive     IMIPENEM <=0.25 SENSITIVE Sensitive     NITROFURANTOIN <=16 SENSITIVE Sensitive     TRIMETH/SULFA >=320 RESISTANT Resistant     AMPICILLIN/SULBACTAM 4 SENSITIVE Sensitive     PIP/TAZO <=4 SENSITIVE Sensitive     Extended ESBL NEGATIVE Sensitive     * >=100,000 COLONIES/mL ESCHERICHIA COLI    Radiology Studies: No results found.  Scheduled Meds:  enoxaparin (LOVENOX) injection  40 mg Subcutaneous Q24H   lithium carbonate  450 mg Oral BID   OLANZapine  10 mg Oral QHS   OLANZapine  20 mg Oral QHS   Continuous Infusions:   LOS: 7 days   Kerney Elbe, DO [facility] PAGER is on AMION  If 7PM-7AM, please contact night-coverage [email or web address] Password [facility] 12/14/2018, 3:21 PM

## 2018-12-14 NOTE — TOC Initial Note (Addendum)
Transition of Care [facility]) - Initial/Assessment Note    Patient Details  Name: Karen Best MRN: [id number] Date of Birth: Oct 31, 1955  [facility] of [facility]) CM/SW Contact:    Pollie Friar, RN Phone Number: 12/14/2018, 9:12 AM  Clinical Narrative:                 Patient currently agreeable to SNF for rehab. Per patients contacts she has been leaving her door open at night and has left her stove on overnight. When questioned about these incidents she states that someone else has done these things and not her.  FL2 completed and faxed out per her request.  TOC following for d/c disposition.  Expected Discharge Plan: Skilled Nursing Facility Barriers to Discharge: Continued Medical Work up   Patient Goals and CMS Choice   CMS Medicare.gov Compare Post Acute Care list provided to:: Patient Choice offered to / list presented to : Patient  Expected Discharge Plan and Services Expected Discharge Plan: [place] In-house Referral: Clinical Social Work Discharge Planning Services: CM Consult Post Acute Care Choice: [place] Living arrangements for the past 2 months: Apartment                                      Prior Living Arrangements/Services Living arrangements for the past 2 months: Apartment Lives with:: Self Patient language and need for interpreter reviewed:: Yes(no needs) Do you feel safe going back to the place where you live?: Yes      Need for Family Participation in Patient Care: Yes (Comment) Care giver support system in place?: No (comment)   Criminal Activity/Legal Involvement Pertinent to Current Situation/Hospitalization: No - Comment as needed  Activities of Daily Living Home Assistive Devices/Equipment: Eyeglasses ADL Screening (condition at time of admission) Patient's cognitive ability adequate to safely complete daily activities?: Yes Is the patient deaf or have difficulty hearing?: No Does the patient  have difficulty seeing, even when wearing glasses/contacts?: No Does the patient have difficulty concentrating, remembering, or making decisions?: Yes Patient able to express need for assistance with ADLs?: Yes Does the patient have difficulty dressing or bathing?: Yes Independently performs ADLs?: No Communication: Independent Dressing (OT): Needs assistance Is this a change from baseline?: Change from baseline, expected to last <3days Grooming: Needs assistance Is this a change from baseline?: Change from baseline, expected to last <3 days Feeding: Independent Bathing: Needs assistance Is this a change from baseline?: Change from baseline, expected to last <3 days Toileting: Needs assistance Is this a change from baseline?: Change from baseline, expected to last <3 days In/Out Bed: Needs assistance Is this a change from baseline?: Change from baseline, expected to last <3 days Walks in Home: Independent with device (comment) Does the patient have difficulty walking or climbing stairs?: Yes Weakness of Legs: Both Weakness of Arms/Hands: Both  Permission Sought/Granted                  Emotional Assessment Appearance:: Appears stated age Attitude/Demeanor/Rapport: Engaged Affect (typically observed): Accepting, Pleasant Orientation: : Oriented to Self, Oriented to Place   Psych Involvement: Yes (comment)(schizophrenic)  Admission diagnosis:  Lower urinary tract infectious disease [N39.0] Patient Active Problem List   Diagnosis Date Noted  . Delirium due to another medical condition   . UTI (urinary tract infection) 12/06/2018  . Acute metabolic encephalopathy 27/06/5007  . 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  Psychosis ([place]) 09/16/2013  . Schizophrenia [facility])    PCP:  [facility] Pharmacy:   [place], [place] [address] [place] [address] Phone: [phone] Fax:  [phone]  [place], [place] [address] [address] Phone: [phone] Fax: [phone]     Social Determinants of Health ([place]) Interventions    Readmission Risk Interventions No flowsheet data found.

## 2018-12-14 NOTE — Progress Notes (Signed)
Physical Therapy Treatment Patient Details Name: Karen Best MRN: [id number] DOB: 1955-06-13 Today's Date: 12/14/2018    History of Present Illness Pt is a 63 y/o female admitted secondary to AMS. Found to have UTI. PMH includes a fib, schizophrenia, bipolar disorder, and hep C.     PT Comments    Patient seen for mobility progression. Pt requires min guard/min A for sit to stand transfer and mod I for bed mobility. Pt initially agreeable to ambulate however once in standing EOB pt declined stating "I can't. I'm afraid of heights". PT will continue to follow acutely and progress as tolerated.     Follow Up Recommendations  SNF;Supervision/Assistance - 24 hour     Equipment Recommendations  Other (comment)(TBD)    Recommendations for Other Services       Precautions / Restrictions Precautions Precautions: Fall Restrictions Weight Bearing Restrictions: No    Mobility  Bed Mobility Overal bed mobility: Modified Independent Bed Mobility: Supine to Sit;Sit to Supine              Transfers Overall transfer level: Needs assistance Equipment used: 1 person hand held assist Transfers: Sit to/from Stand Sit to Stand: Min guard;Min assist         General transfer comment: pt stood X 3 from EOB with min guard when powering up into standing and min A upon standing to steady; pt declined taking R hand off of bed rail in standing   Ambulation/Gait             General Gait Details: pt declined ambulating due to fear of heights; therapist attempted to explain that pt would be ambulating on flat ground in room however pt returned to supine    Stairs             Wheelchair Mobility    Modified Rankin (Stroke Patients Only)       Balance Overall balance assessment: Needs assistance Sitting-balance support: Bilateral upper extremity supported;Feet supported Sitting balance-Leahy Scale: Good     Standing balance support: Single extremity  supported Standing balance-Leahy Scale: Poor                              Cognition Arousal/Alertness: Awake/alert Behavior During Therapy: WFL for tasks assessed/performed Overall Cognitive Status: No family/caregiver present to determine baseline cognitive functioning                                 General Comments: pt initially agreeable to ambulate but then in standing states "I can't. I'm afraid of heights"      Exercises      General Comments        Pertinent Vitals/Pain Pain Assessment: No/denies pain    Home Living                      Prior Function            PT Goals (current goals can now be found in the care plan section) Acute Rehab PT Goals Patient Stated Goal: to feel better Progress towards PT goals: Progressing toward goals    Frequency    Min 2X/week      PT Plan Current plan remains appropriate    Co-evaluation              AM-PAC PT "6 Clicks" Mobility   Outcome Measure  Help needed turning from your back to your side while in a flat bed without using bedrails?: None Help needed moving from lying on your back to sitting on the side of a flat bed without using bedrails?: None Help needed moving to and from a bed to a chair (including a wheelchair)?: A Little Help needed standing up from a chair using your arms (e.g., wheelchair or bedside chair)?: A Little Help needed to walk in hospital room?: A Little Help needed climbing 3-5 steps with a railing? : A Lot 6 Click Score: 19    End of Session Equipment Utilized During Treatment: Gait belt Activity Tolerance: Patient tolerated treatment well Patient left: with call bell/phone within reach;in bed;with bed alarm set Nurse Communication: Mobility status PT Visit Diagnosis: Unsteadiness on feet (R26.81);Muscle weakness (generalized) (M62.81)     Time: 0100-7121 PT Time Calculation (min) (ACUTE ONLY): 14 min  Charges:  $Therapeutic Activity:  8-22 mins                     Earney Navy, PTA Acute Rehabilitation Services Pager: [phone] Office: [phone]     Darliss Cheney 12/14/2018, 1:47 PM

## 2018-12-15 NOTE — Progress Notes (Signed)
PROGRESS NOTE    Karen Best  GYI:[id number] DOB: 07/26/1955 DOA: 12/06/2018 PCP: [facility]    Brief Narrative:  Patient is 63 year old female with history of schizophrenia, hepatitis C was treated who presented to the emergency room with confusion and found to have E. coli UTI.  Patient was admitted and treated in the hospital, her mental status significantly improved however she remains with poor cognition that is probably her baseline.  Psychiatry consulted for possible paranoid schizophrenia and they cleared the patient and continue with same medications.  Her symptoms were probably aggravated by presence of UTI.  Remains deconditioned needing skilled nursing level of care on discharge.   Assessment & Plan:   Principal Problem:   Delirium due to another medical condition Active Problems:   Schizophrenia ([place])   UTI (urinary tract infection)   Acute metabolic encephalopathy  E. coli UTI: Treated with 7 days of therapy and finished.  Improved.  Acute metabolic encephalopathy superimposed on chronic cognitive dysfunction: Improved.  CT head and MRI brain were normal.  B12 level was normal.  RPR was nonreactive.  HIV negative.  Ammonia level normal.  Lithium level therapeutic.  UDS negative.  Schizophrenia: Lithium level therapeutic.  Continue lithium and nighttime olanzapine.  As per psychiatry, patient is fairly stabilized.  Hyponatremia: Sodium 133 on admission.  Stabilized.  Deconditioning: Will benefit with inpatient therapies.  Continue to work with PT OT.  Transfer to a skilled level of care when available.  DVT prophylaxis: Lovenox Code Status: Full code Family Communication: None Disposition Plan: Skilled nursing facility when bed available   Consultants:   None  Procedures:   None  Antimicrobials:   Ceftriaxone, 12/06/2018-12/08/2018  Cephalexin, 12/08/2018-12/14/2018   Subjective: Patient seen and examined.  Not very  participating to physical therapy.  She denies any complaints.  Herself with no complaints.  She wants to go home, however is not able to demonstrate that she is safe to walk.  Objective: Vitals:   12/15/18 0327 12/15/18 0500 12/15/18 0800 12/15/18 1132  BP: 122/69  133/74 126/67  Pulse: 66  64 68  Resp: 14  16 16   Temp: 97.9 F (36.6 C)  98.5 F (36.9 C) 98 F (36.7 C)  TempSrc: Oral  Oral Oral  SpO2: 99%  97% 100%  Weight:  62.5 kg    Height:        Intake/Output Summary (Last 24 hours) at 12/15/2018 1318 Last data filed at 12/15/2018 0635 Gross per 24 hour  Intake 1000 ml  Output 1900 ml  Net -900 ml   Filed Weights   12/13/18 0500 12/14/18 0459 12/15/18 0500  Weight: 61.6 kg 62.4 kg 62.5 kg    Examination:  General exam: Appears calm and comfortable  Respiratory system: Clear to auscultation. Respiratory effort normal. Cardiovascular system: S1 & S2 heard, RRR. No JVD, murmurs, rubs, gallops or clicks. No pedal edema. Gastrointestinal system: Abdomen is nondistended, soft and nontender. No organomegaly or masses felt. Normal bowel sounds heard. Central nervous system: Alert and oriented. No focal neurological deficits. Extremities: Symmetric 5 x 5 power. Skin: No rashes, lesions or ulcers Psychiatry: Judgement and insight appear normal. Mood & affect appropriate.     Data Reviewed: I have personally reviewed following labs and imaging studies  CBC: Recent Labs  Lab 12/09/18 0433 12/10/18 0524 12/11/18 0359  WBC 6.2 6.7 7.0  NEUTROABS 2.3 2.8 2.9  HGB 11.4* 11.8* 13.0  HCT 38.2 39.3 42.6  MCV 77.0* 76.2*  75.4*  PLT 284 312 161   Basic Metabolic Panel: Recent Labs  Lab 12/09/18 0433 12/10/18 0524 12/11/18 0359 12/12/18 0528  NA 138 137 137 133*  K 3.9 4.1 3.8 4.0  CL 107 108 108 102  CO2 25 23 21* 24  GLUCOSE 91 104* 109* 97  BUN 6* 7* 6* 8  CREATININE 0.84 0.77 0.61 0.73  CALCIUM 9.3 9.4 9.9 9.5  MG 1.9 1.8 1.9 1.7  PHOS 3.5 3.6 4.2 4.4    GFR: Estimated Creatinine Clearance: 65.6 mL/min (by C-G formula based on SCr of 0.73 mg/dL). Liver Function Tests: Recent Labs  Lab 12/09/18 0433 12/10/18 0524 12/11/18 0359 12/12/18 0528  AST 30 41 44* 36  ALT 26 37 45* 44  ALKPHOS 60 64 74 69  BILITOT 0.2* 0.3 0.5 0.4  PROT 6.4* 6.6 6.9 7.2  ALBUMIN 2.9* 3.0* 3.2* 3.0*   No results for input(s): LIPASE, AMYLASE in the last 168 hours. Recent Labs  Lab 12/08/18 1650  AMMONIA 12   Coagulation Profile: No results for input(s): INR, PROTIME in the last 168 hours. Cardiac Enzymes: No results for input(s): CKTOTAL, CKMB, CKMBINDEX, TROPONINI in the last 168 hours. BNP (last 3 results) No results for input(s): PROBNP in the last 8760 hours. HbA1C: No results for input(s): HGBA1C in the last 72 hours. CBG: No results for input(s): GLUCAP in the last 168 hours. Lipid Profile: No results for input(s): CHOL, HDL, LDLCALC, TRIG, CHOLHDL, LDLDIRECT in the last 72 hours. Thyroid Function Tests: No results for input(s): TSH, T4TOTAL, FREET4, T3FREE, THYROIDAB in the last 72 hours. Anemia Panel: No results for input(s): VITAMINB12, FOLATE, FERRITIN, TIBC, IRON, RETICCTPCT in the last 72 hours. Sepsis Labs: No results for input(s): PROCALCITON, LATICACIDVEN in the last 168 hours.  Recent Results (from the past 240 hour(s))  SARS CORONAVIRUS 2 Nasal Swab Aptima Multi Swab     Status: None   Collection Time: 12/06/18  8:59 PM   Specimen: Aptima Multi Swab; Nasal Swab  Result Value Ref Range Status   SARS Coronavirus 2 NEGATIVE NEGATIVE Final    Comment: (NOTE) SARS-CoV-2 target nucleic acids are NOT DETECTED. The SARS-CoV-2 RNA is generally detectable in upper and lower respiratory specimens during the acute phase of infection. Negative results do not preclude SARS-CoV-2 infection, do not rule out co-infections with other pathogens, and should not be used as the sole basis for treatment or other patient management decisions.  Negative results must be combined with clinical observations, patient history, and epidemiological information. The expected result is Negative. Fact Sheet for Patients: SugarRoll.be Fact Sheet for Healthcare Providers: https://www.woods-mathews.com/ This test is not yet approved or cleared by the [place] FDA and  has been authorized for detection and/or diagnosis of SARS-CoV-2 by FDA under an Emergency Use Authorization (EUA). This EUA will remain  in effect (meaning this test can be used) for the duration of the COVID-19 declaration under Section 56 4(b)(1) of the Act, 21 U.S.C. section 360bbb-3(b)(1), unless the authorization is terminated or revoked sooner. Performed at [facility], [place] [address], [place], [place] [address]   Urine Culture     Status: Abnormal   Collection Time: 12/06/18  9:38 PM   Specimen: Urine, Random  Result Value Ref Range Status   Specimen Description URINE, RANDOM  Final   Special Requests   Final    NONE Performed at [facility], [place] [address], [place], [place] [address]    Culture >=100,000 COLONIES/mL ESCHERICHIA  COLI (A)  Final   Report Status 12/08/2018 FINAL  Final   Organism ID, Bacteria ESCHERICHIA COLI (A)  Final      Susceptibility   Escherichia coli - MIC*    AMPICILLIN >=32 RESISTANT Resistant     CEFAZOLIN <=4 SENSITIVE Sensitive     CEFTRIAXONE <=1 SENSITIVE Sensitive     CIPROFLOXACIN <=0.25 SENSITIVE Sensitive     GENTAMICIN <=1 SENSITIVE Sensitive     IMIPENEM <=0.25 SENSITIVE Sensitive     NITROFURANTOIN <=16 SENSITIVE Sensitive     TRIMETH/SULFA >=320 RESISTANT Resistant     AMPICILLIN/SULBACTAM 4 SENSITIVE Sensitive     PIP/TAZO <=4 SENSITIVE Sensitive     Extended ESBL NEGATIVE Sensitive     * >=100,000 COLONIES/mL ESCHERICHIA COLI         Radiology Studies: No results found.      Scheduled Meds: . enoxaparin (LOVENOX) injection  40 mg Subcutaneous  Q24H  . lithium carbonate  450 mg Oral BID  . OLANZapine  10 mg Oral QHS  . OLANZapine  20 mg Oral QHS   Continuous Infusions:   LOS: 8 days    Time spent: 25 minutes    Barb Merino, MD [facility] Pager [phone]  If 7PM-7AM, please contact night-coverage [email or web address] Password [email or web address] 12/15/2018, 1:18 PM

## 2018-12-15 NOTE — Progress Notes (Signed)
Occupational Therapy Treatment Patient Details Name: Karen Best MRN: [id number] DOB: 02/17/1956 Today's Date: 12/15/2018    History of present illness Pt is a 63 y/o female admitted secondary to [place]. Found to have UTI. PMH includes a fib, schizophrenia, bipolar disorder, and hep C.    OT comments  Pt continues to be self limiting, only sitting at EOB for drinking juice with OT. Spilled juice and changed gown. Pt began to stand to get to chair and suddenly returned to supine despite maximum encouragement. Continue to recommend SNF.  Follow Up Recommendations  SNF;Supervision/Assistance - 24 hour    Equipment Recommendations  None recommended by OT    Recommendations for Other Services      Precautions / Restrictions Precautions Precautions: Fall Precaution Comments: pt very fearful of falling       Mobility Bed Mobility Overal bed mobility: Modified Independent             General bed mobility comments: HOB up 30 degrees  Transfers                 General transfer comment: stood half way from bed and then abruptly changed her mind and returned to supine    Balance Overall balance assessment: Needs assistance Sitting-balance support: Feet supported Sitting balance-Leahy Scale: Good                                     ADL either performed or assessed with clinical judgement   ADL   Eating/Feeding: Independent;Sitting               Upper Body Dressing : Minimal assistance;Sitting(to tie gown)                     General ADL Comments: pt with poor insight, refusing OOB despite maximum encouragement      Vision       Perception     Praxis      Cognition Arousal/Alertness: Awake/alert Behavior During Therapy: Flat affect Overall Cognitive Status: No family/caregiver present to determine baseline cognitive functioning Area of Impairment: Memory;Following commands;Safety/judgement;Problem solving                      Memory: Decreased short-term memory Following Commands: Follows one step commands with increased time Safety/Judgement: Decreased awareness of safety;Decreased awareness of deficits   Problem Solving: Slow processing;Requires verbal cues General Comments: pt resistant to OOB, started to get to chair and changed her mind, returning to bed        Exercises     Shoulder Instructions       General Comments      Pertinent Vitals/ Pain       Pain Assessment: Faces Faces Pain Scale: No hurt  Home Living                                          Prior Functioning/Environment              Frequency  Min 2X/week        Progress Toward Goals  OT Goals(current goals can now be found in the care plan section)  Progress towards OT goals: Not progressing toward goals - comment  Acute Rehab OT Goals Patient Stated Goal: to go home and smoke a  cigarette OT Goal Formulation: With patient Time For Goal Achievement: 12/22/18 Potential to Achieve Goals: [place] Discharge plan remains appropriate    Co-evaluation                 AM-PAC OT "6 Clicks" Daily Activity     Outcome Measure   Help from another person eating meals?: None Help from another person taking care of personal grooming?: A Little Help from another person toileting, which includes using toliet, bedpan, or urinal?: A Little Help from another person bathing (including washing, rinsing, drying)?: A Lot Help from another person to put on and taking off regular upper body clothing?: A Little Help from another person to put on and taking off regular lower body clothing?: A Lot 6 Click Score: 17    End of Session    OT Visit Diagnosis: Unsteadiness on feet (R26.81);Muscle weakness (generalized) (M62.81);Other symptoms and signs involving cognitive function   Activity Tolerance Other (comment)(self limiting)   Patient Left in bed;with call bell/phone within reach;with  bed alarm set   Nurse Communication          Time: 1020-1040 OT Time Calculation (min): 20 min  Charges: OT General Charges $OT Visit: 1 Visit OT Treatments $Self Care/Home Management : 8-22 mins  Nestor Lewandowsky, OTR/L Acute Rehabilitation Services Pager: [phone] Office: [phone]  Malka So 12/15/2018, 12:12 PM

## 2018-12-15 NOTE — TOC Progression Note (Signed)
LATE NOTE SUBMISSION    Transition of Care [facility]) - Progression Note    Patient Details  Name: Karen Best MRN: [id number] Date of Birth: February 11, 1956  Transition of Care [facility]) CM/SW [place], [place] Phone Number: 12/15/2018, 12:03 PM  Clinical Narrative:   CSW following for discharge plan. Patient has no bed offers for SNF. CSW reached out to [facility] to have them look at referral. Per [facility], they are showing that the patient's Medicaid would not cover SNF. CSW to ask patient if she has a disability check that she receives and follow back up with SNF search. Patient's PASRR is still pending.    Expected Discharge Plan: [place] Barriers to Discharge: Continued Medical Work up  Expected Discharge Plan and Services Expected Discharge Plan: [place] In-house Referral: Clinical Social Work Discharge Planning Services: CM Consult Post Acute Care Choice: [place] Living arrangements for the past 2 months: Apartment                                       Social Determinants of Health (SDOH) Interventions    Readmission Risk Interventions No flowsheet data found.

## 2018-12-16 NOTE — TOC Transition Note (Signed)
Transition of Care [facility]) - CM/SW Discharge Note   Patient Details  Name: Karen Best MRN: [id number] Date of Birth: 19-Sep-1955  [facility] CM/SW Contact:  Pollie Friar, RN Phone Number: 12/16/2018, 12:22 PM   Clinical Narrative:    Pt able to ambulate the hallways of the unit today. MD feels she is good to d/c home. CM updated Clarene Critchley with her ACT team through [facility] and sent them the d/c information requested with Pts permission.  CM provided cab voucher for transport home. Pt has her apartment key.   Final next level of care: Home/Self Care Barriers to Discharge: No Barriers Identified   Patient Goals and CMS Choice   CMS Medicare.gov Compare Post Acute Care list provided to:: Patient Choice offered to / list presented to : Patient  Discharge Placement                       Discharge Plan and Services In-house Referral: Clinical Social Work Discharge Planning Services: CM Consult Post Acute Care Choice: Skilled Nursing Facility                               Social Determinants of Health (SDOH) Interventions     Readmission Risk Interventions No flowsheet data found.

## 2018-12-16 NOTE — Discharge Summary (Signed)
Physician Discharge Summary  Karen Best KXF:[id number] DOB: Jul 20, 1955 DOA: 12/06/2018  PCP: [facility]  Admit date: 12/06/2018 Discharge date: 12/16/2018  Admitted From: Home. Disposition: Home.  Recommendations for Outpatient Follow-up:  1. Follow up with PCP in 1-2 weeks   Home Health: Not applicable Equipment/Devices: Not needed  Discharge Condition: Stable CODE STATUS: Full code Diet recommendation: Regular diet  Brief/Interim Summary: Patient is 63 year old female with history of schizophrenia, hepatitis C was treated who presented to the emergency room with confusion and found to have E. coli UTI.  Patient was admitted and treated in the hospital, her mental status significantly improved however she remains with poor cognition that is probably her baseline.  Psychiatry consulted for possible paranoid schizophrenia and they cleared the patient and continue with same medications.  Her symptoms were probably aggravated by presence of UTI.   Discharge Diagnoses:  Principal Problem:   Delirium due to another medical condition Active Problems:   Schizophrenia ([place])   UTI (urinary tract infection)   Acute metabolic encephalopathy  Acute metabolic encephalopathy superimposed on chronic cognitive dysfunction: Improved.  CT head and MRI brain were normal.  B12 level was normal.  RPR was nonreactive.  HIV negative.  Ammonia level normal. Lithium level therapeutic.  UDS negative. Treated for E. coli UTI with 7 days of antibiotics with improvement.  Schizophrenia: Lithium level therapeutic.  Continue lithium and nighttime olanzapine.  As per psychiatry, patient is stable and cleared, they said no need to adjust medications.   Patient was hesitant to work with therapist to walk.  She said she is afraid of the height.  Patient insisted on going home.  We asked her to walk and she walked independent with no gait deficit. Patient is walking in the  hallway.  She is wishing to go home.  She has outpatient case manager they will follow-up.   Discharge Instructions  Discharge Instructions    Diet - low sodium heart healthy   Complete by: As directed    Increase activity slowly   Complete by: As directed      Allergies as of 12/16/2018      Reactions   Penicillins Other (See Comments)   Child hood reaction Has patient had a PCN reaction causing immediate rash, facial/tongue/throat swelling, SOB or lightheadedness with hypotension: Yes Has patient had a PCN reaction causing severe rash involving mucus membranes or skin necrosis: No Has patient had a PCN reaction that required hospitalization No Has patient had a PCN reaction occurring within the last 10 years: No If all of the above answers are "NO", then may proceed with C      Medication List    STOP taking these medications   Glecaprevir-Pibrentasvir 100-40 MG Tabs Commonly known as: Mavyret   ranitidine 150 MG tablet Commonly known as: ZANTAC     TAKE these medications   cetirizine 10 MG tablet Commonly known as: ZYRTEC Take 10 mg by mouth daily.   famotidine 40 MG tablet Commonly known as: PEPCID Take 40 mg by mouth 2 (two) times daily.   hydrOXYzine 50 MG capsule Commonly known as: VISTARIL Take 50 mg by mouth at bedtime.   Ingrezza 80 MG Caps Generic drug: Valbenazine Tosylate Take 1 tablet by mouth at bedtime.   lithium carbonate 450 MG CR tablet Commonly known as: ESKALITH Take 450 mg by mouth 2 (two) times daily.   OLANZapine 10 MG tablet Commonly known as: ZYPREXA Take 10 mg by mouth at  bedtime. Take with olanzapine 20 mg to equal 30 mg   OLANZapine 20 MG tablet Commonly known as: ZYPREXA Take 20 mg by mouth at bedtime. Take with Olanzapine 10 mg to equal 30 mg     ASK your doctor about these medications   cephALEXin 500 MG capsule Commonly known as: KEFLEX Take 1 capsule (500 mg total) by mouth every 8 (eight) hours for 5 days. Ask about:  Should I take this medication?      [place]. Call.   Specialty: Pediatrics Why: Follow up within 1 week  Contact information: [address] [place] [address] [phone]          Allergies  Allergen Reactions  . Penicillins Other (See Comments)    Child hood reaction Has patient had a PCN reaction causing immediate rash, facial/tongue/throat swelling, SOB or lightheadedness with hypotension: Yes Has patient had a PCN reaction causing severe rash involving mucus membranes or skin necrosis: No Has patient had a PCN reaction that required hospitalization No Has patient had a PCN reaction occurring within the last 10 years: No If all of the above answers are "NO", then may proceed with C    Consultations:  Psychiatry   Procedures/Studies: Ct Head Wo Contrast  Result Date: 12/05/2018 CLINICAL DATA:  Slurred speech for 1 hour EXAM: CT HEAD WITHOUT CONTRAST TECHNIQUE: Contiguous axial images were obtained from the base of the skull through the vertex without intravenous contrast. COMPARISON:  11/09/2018 FINDINGS: Brain: Mild atrophic changes are noted. No findings to suggest acute hemorrhage, acute infarction or space-occupying mass lesion are noted. Vascular: No hyperdense vessel or unexpected calcification. Skull: Normal. Negative for fracture or focal lesion. Sinuses/Orbits: No acute finding. Other: None. IMPRESSION: Atrophic changes without acute abnormality. Electronically Signed   By: Inez Catalina M.D.   On: 12/05/2018 03:04   Mr Brain Wo Contrast  Result Date: 12/05/2018 CLINICAL DATA:  Slurred speech for 1 hour EXAM: MRI HEAD WITHOUT CONTRAST TECHNIQUE: Multiplanar, multiecho pulse sequences of the brain and surrounding structures were obtained without intravenous contrast. COMPARISON:  09/26/2018 brain MRI FINDINGS: BRAIN: There is no acute infarct, acute hemorrhage or extra-axial collection. The white matter  signal is normal for the patient's age. The cerebral and cerebellar volume are age-appropriate. There is no hydrocephalus. The midline structures are normal. VASCULAR: Susceptibility-sensitive sequences show no chronic microhemorrhage or superficial siderosis. The major intracranial arterial and venous sinus flow voids are normal. SKULL AND UPPER CERVICAL SPINE: Calvarial bone marrow signal is normal. There is no skull base mass. The visualized upper cervical spine and soft tissues are normal. SINUSES/ORBITS: There are no fluid levels or advanced mucosal thickening. Right mastoid effusion, unchanged. The orbits are normal. IMPRESSION: No acute intracranial abnormality.  Normal aging brain. Electronically Signed   By: Ulyses Jarred M.D.   On: 12/05/2018 07:00   US Abdomen Limited Ruq  Result Date: 12/11/2018 CLINICAL DATA:  Abnormal liver function test today. History of gallstones. EXAM: ULTRASOUND ABDOMEN LIMITED RIGHT UPPER QUADRANT COMPARISON:  Ultrasound dated 10/13/2017. FINDINGS: Gallbladder: Again noted are multiple gallstones, largest measuring 4 mm. No gallbladder wall thickening or pericholecystic fluid. No sonographic Murphy's sign elicited during the exam, per the sonographer Common bile duct: Diameter: 3 mm Liver: No focal lesion identified. Within normal limits in parenchymal echogenicity. Portal vein is patent on color Doppler imaging with normal direction of blood flow towards the liver. Other: None. IMPRESSION: 1. Cholelithiasis without evidence of  acute cholecystitis. 2. No bile duct dilatation. 3. Liver appears normal. Electronically Signed   By: Franki Cabot M.D.   On: 12/11/2018 20:15      Subjective: Patient seen and examined.  Remains without any problems.  No overnight events.  Patient states that she can walk without trouble and she walked in front of [place].   Discharge Exam: Vitals:   12/16/18 0400 12/16/18 0815  BP: 128/69 122/88  Pulse: 80 (!) 57  Resp: 18 18  Temp: 98.4 F  (36.9 C) 98 F (36.7 C)  SpO2: 100% 100%   Vitals:   12/15/18 1951 12/15/18 2352 12/16/18 0400 12/16/18 0815  BP: (!) 142/70 130/68 128/69 122/88  Pulse: 73 69 80 (!) 57  Resp: 18 18 18 18   Temp: 97.8 F (36.6 C) 98 F (36.7 C) 98.4 F (36.9 C) 98 F (36.7 C)  TempSrc: Oral Oral Oral Oral  SpO2: 100% 100% 100% 100%  Weight:   63 kg   Height:        General: Pt is alert, awake, not in acute distress Cardiovascular: RRR, S1/S2 +, no rubs, no gallops Respiratory: CTA bilaterally, no wheezing, no rhonchi Abdominal: Soft, NT, ND, bowel sounds + Extremities: no edema, no cyanosis Patient is alert and oriented x3.     The results of significant diagnostics from this hospitalization (including imaging, microbiology, ancillary and laboratory) are listed below for reference.     Microbiology: Recent Results (from the past 240 hour(s))  SARS CORONAVIRUS 2 Nasal Swab Aptima Multi Swab     Status: None   Collection Time: 12/06/18  8:59 PM   Specimen: Aptima Multi Swab; Nasal Swab  Result Value Ref Range Status   SARS Coronavirus 2 NEGATIVE NEGATIVE Final    Comment: (NOTE) SARS-CoV-2 target nucleic acids are NOT DETECTED. The SARS-CoV-2 RNA is generally detectable in upper and lower respiratory specimens during the acute phase of infection. Negative results do not preclude SARS-CoV-2 infection, do not rule out co-infections with other pathogens, and should not be used as the sole basis for treatment or other patient management decisions. Negative results must be combined with clinical observations, patient history, and epidemiological information. The expected result is Negative. Fact Sheet for Patients: SugarRoll.be Fact Sheet for Healthcare Providers: https://www.woods-mathews.com/ This test is not yet approved or cleared by the [place] FDA and  has been authorized for detection and/or diagnosis of SARS-CoV-2 by FDA under an  Emergency Use Authorization (EUA). This EUA will remain  in effect (meaning this test can be used) for the duration of the COVID-19 declaration under Section 56 4(b)(1) of the Act, 21 U.S.C. section 360bbb-3(b)(1), unless the authorization is terminated or revoked sooner. Performed at [facility], [place] [address] [place], [place] [address]   Urine Culture     Status: Abnormal   Collection Time: 12/06/18  9:38 PM   Specimen: Urine, Random  Result Value Ref Range Status   Specimen Description URINE, RANDOM  Final   Special Requests   Final    NONE Performed at [facility], [place] [address] [place], [place] [address]    Culture >=100,000 COLONIES/mL ESCHERICHIA COLI (A)  Final   Report Status 12/08/2018 FINAL  Final   Organism ID, Bacteria ESCHERICHIA COLI (A)  Final      Susceptibility   Escherichia coli - MIC*    AMPICILLIN >=32 RESISTANT Resistant     CEFAZOLIN <=4 SENSITIVE Sensitive     CEFTRIAXONE <=1 SENSITIVE Sensitive  CIPROFLOXACIN <=0.25 SENSITIVE Sensitive     GENTAMICIN <=1 SENSITIVE Sensitive     IMIPENEM <=0.25 SENSITIVE Sensitive     NITROFURANTOIN <=16 SENSITIVE Sensitive     TRIMETH/SULFA >=320 RESISTANT Resistant     AMPICILLIN/SULBACTAM 4 SENSITIVE Sensitive     PIP/TAZO <=4 SENSITIVE Sensitive     Extended ESBL NEGATIVE Sensitive     * >=100,000 COLONIES/mL ESCHERICHIA COLI     Labs: BNP (last 3 results) No results for input(s): BNP in the last 8760 hours. Basic Metabolic Panel: Recent Labs  Lab 12/10/18 0524 12/11/18 0359 12/12/18 0528  NA 137 137 133*  K 4.1 3.8 4.0  CL 108 108 102  CO2 23 21* 24  GLUCOSE 104* 109* 97  BUN 7* 6* 8  CREATININE 0.77 0.61 0.73  CALCIUM 9.4 9.9 9.5  MG 1.8 1.9 1.7  PHOS 3.6 4.2 4.4   Liver Function Tests: Recent Labs  Lab 12/10/18 0524 12/11/18 0359 12/12/18 0528  AST 41 44* 36  ALT 37 45* 44  ALKPHOS 64 74 69  BILITOT 0.3 0.5 0.4  PROT 6.6 6.9 7.2  ALBUMIN 3.0* 3.2* 3.0*   No  results for input(s): LIPASE, AMYLASE in the last 168 hours. No results for input(s): AMMONIA in the last 168 hours. CBC: Recent Labs  Lab 12/10/18 0524 12/11/18 0359  WBC 6.7 7.0  NEUTROABS 2.8 2.9  HGB 11.8* 13.0  HCT 39.3 42.6  MCV 76.2* 75.4*  PLT 312 306   Cardiac Enzymes: No results for input(s): CKTOTAL, CKMB, CKMBINDEX, TROPONINI in the last 168 hours. BNP: Invalid input(s): POCBNP CBG: No results for input(s): GLUCAP in the last 168 hours. D-Dimer No results for input(s): DDIMER in the last 72 hours. Hgb A1c No results for input(s): HGBA1C in the last 72 hours. Lipid Profile No results for input(s): CHOL, HDL, LDLCALC, TRIG, CHOLHDL, LDLDIRECT in the last 72 hours. Thyroid function studies No results for input(s): TSH, T4TOTAL, T3FREE, THYROIDAB in the last 72 hours.  Invalid input(s): FREET3 Anemia work up No results for input(s): VITAMINB12, FOLATE, FERRITIN, TIBC, IRON, RETICCTPCT in the last 72 hours. Urinalysis    Component Value Date/Time   COLORURINE AMBER (A) 12/06/2018 2148   APPEARANCEUR CLOUDY (A) 12/06/2018 2148   LABSPEC 1.010 12/06/2018 2148   PHURINE 6.0 12/06/2018 2148   GLUCOSEU NEGATIVE 12/06/2018 2148   HGBUR NEGATIVE 12/06/2018 2148   BILIRUBINUR NEGATIVE 12/06/2018 2148   KETONESUR NEGATIVE 12/06/2018 2148   PROTEINUR NEGATIVE 12/06/2018 2148   UROBILINOGEN 0.2 10/26/2018 1355   NITRITE NEGATIVE 12/06/2018 2148   LEUKOCYTESUR LARGE (A) 12/06/2018 2148   Sepsis Labs Invalid input(s): PROCALCITONIN,  WBC,  LACTICIDVEN Microbiology Recent Results (from the past 240 hour(s))  SARS CORONAVIRUS 2 Nasal Swab Aptima Multi Swab     Status: None   Collection Time: 12/06/18  8:59 PM   Specimen: Aptima Multi Swab; Nasal Swab  Result Value Ref Range Status   SARS Coronavirus 2 NEGATIVE NEGATIVE Final    Comment: (NOTE) SARS-CoV-2 target nucleic acids are NOT DETECTED. The SARS-CoV-2 RNA is generally detectable in upper and lower respiratory  specimens during the acute phase of infection. Negative results do not preclude SARS-CoV-2 infection, do not rule out co-infections with other pathogens, and should not be used as the sole basis for treatment or other patient management decisions. Negative results must be combined with clinical observations, patient history, and epidemiological information. The expected result is Negative. Fact Sheet for Patients: SugarRoll.be Fact Sheet for Healthcare Providers: https://www.woods-mathews.com/  This test is not yet approved or cleared by the [place] and  has been authorized for detection and/or diagnosis of SARS-CoV-2 by FDA under an Emergency Use Authorization (EUA). This EUA will remain  in effect (meaning this test can be used) for the duration of the COVID-19 declaration under Section 56 4(b)(1) of the Act, 21 U.S.C. section 360bbb-3(b)(1), unless the authorization is terminated or revoked sooner. Performed at [facility], [place] [address], [place], [place] [address]   Urine Culture     Status: Abnormal   Collection Time: 12/06/18  9:38 PM   Specimen: Urine, Random  Result Value Ref Range Status   Specimen Description URINE, RANDOM  Final   Special Requests   Final    NONE Performed at [facility], [place] [address] [place], [place] [address]    Culture >=100,000 COLONIES/mL ESCHERICHIA COLI (A)  Final   Report Status 12/08/2018 FINAL  Final   Organism ID, Bacteria ESCHERICHIA COLI (A)  Final      Susceptibility   Escherichia coli - MIC*    AMPICILLIN >=32 RESISTANT Resistant     CEFAZOLIN <=4 SENSITIVE Sensitive     CEFTRIAXONE <=1 SENSITIVE Sensitive     CIPROFLOXACIN <=0.25 SENSITIVE Sensitive     GENTAMICIN <=1 SENSITIVE Sensitive     IMIPENEM <=0.25 SENSITIVE Sensitive     NITROFURANTOIN <=16 SENSITIVE Sensitive     TRIMETH/SULFA >=320 RESISTANT Resistant     AMPICILLIN/SULBACTAM 4 SENSITIVE Sensitive      PIP/TAZO <=4 SENSITIVE Sensitive     Extended ESBL NEGATIVE Sensitive     * >=100,000 COLONIES/mL ESCHERICHIA COLI     Time coordinating discharge:  35 minutes  SIGNED:   Barb Merino, MD  [facility] 12/16/2018, 10:10 AM

## 2018-12-16 NOTE — Plan of Care (Signed)
  Problem: Health Behavior/Discharge Planning: Goal: Ability to manage health-related needs will improve Outcome: Adequate for Discharge   Problem: Clinical Measurements: Goal: Ability to maintain clinical measurements within normal limits will improve Outcome: Adequate for Discharge Goal: Will remain free from infection Outcome: Adequate for Discharge Goal: Diagnostic test results will improve Outcome: Adequate for Discharge Goal: Respiratory complications will improve Outcome: Adequate for Discharge   Problem: Activity: Goal: Risk for activity intolerance will decrease Outcome: Adequate for Discharge   Problem: Nutrition: Goal: Adequate nutrition will be maintained Outcome: Adequate for Discharge   Problem: Elimination: Goal: Will not experience complications related to bowel motility Outcome: Adequate for Discharge Goal: Will not experience complications related to urinary retention Outcome: Adequate for Discharge   Problem: Pain Managment: Goal: General experience of comfort will improve Outcome: Adequate for Discharge   Problem: Safety: Goal: Ability to remain free from injury will improve Outcome: Adequate for Discharge   Problem: Skin Integrity: Goal: Risk for impaired skin integrity will decrease Outcome: Adequate for Discharge   Problem: Acute Rehab PT Goals(only PT should resolve) Goal: Patient Will Transfer Sit To/From Stand Outcome: Adequate for Discharge Goal: Pt Will Ambulate Outcome: Adequate for Discharge Goal: PT Additional Goal #1 Outcome: Adequate for Discharge

## 2018-12-16 NOTE — Progress Notes (Signed)
Discharge  Pt was able to participate with discharge teaching. PIV removed and pt dressed. Pt taken out via wheelchair by this RN. RN gave taxi voucher to taxi driver.

## 2018-12-23 ENCOUNTER — Emergency Department (HOSPITAL_COMMUNITY): Payer: Medicaid Other

## 2018-12-23 ENCOUNTER — Other Ambulatory Visit: Payer: Self-pay

## 2018-12-23 ENCOUNTER — Inpatient Hospital Stay (HOSPITAL_COMMUNITY)
Admission: EM | Admit: 2018-12-23 | Discharge: 2018-12-31 | DRG: 885 | Disposition: A | Discharge status: Skilled Nursing Facility | Payer: Medicaid Other | Attending: Internal Medicine | Admitting: Internal Medicine

## 2018-12-23 ENCOUNTER — Encounter (HOSPITAL_COMMUNITY): Payer: Self-pay

## 2018-12-23 ENCOUNTER — Observation Stay (HOSPITAL_COMMUNITY): Payer: Medicaid Other

## 2018-12-23 DIAGNOSIS — R29810 Facial weakness: Secondary | ICD-10-CM | POA: Diagnosis present

## 2018-12-23 DIAGNOSIS — R471 Dysarthria and anarthria: Secondary | ICD-10-CM | POA: Diagnosis present

## 2018-12-23 DIAGNOSIS — F17210 Nicotine dependence, cigarettes, uncomplicated: Secondary | ICD-10-CM | POA: Diagnosis present

## 2018-12-23 DIAGNOSIS — J45909 Unspecified asthma, uncomplicated: Secondary | ICD-10-CM | POA: Diagnosis present

## 2018-12-23 DIAGNOSIS — F319 Bipolar disorder, unspecified: Secondary | ICD-10-CM | POA: Diagnosis present

## 2018-12-23 DIAGNOSIS — G9340 Encephalopathy, unspecified: Secondary | ICD-10-CM

## 2018-12-23 DIAGNOSIS — J358 Other chronic diseases of tonsils and adenoids: Secondary | ICD-10-CM | POA: Diagnosis present

## 2018-12-23 DIAGNOSIS — Z880 Allergy status to penicillin: Secondary | ICD-10-CM

## 2018-12-23 DIAGNOSIS — Z20828 Contact with and (suspected) exposure to other viral communicable diseases: Secondary | ICD-10-CM | POA: Diagnosis present

## 2018-12-23 DIAGNOSIS — N3000 Acute cystitis without hematuria: Secondary | ICD-10-CM

## 2018-12-23 DIAGNOSIS — Z87440 Personal history of urinary (tract) infections: Secondary | ICD-10-CM

## 2018-12-23 DIAGNOSIS — R410 Disorientation, unspecified: Secondary | ICD-10-CM

## 2018-12-23 DIAGNOSIS — R030 Elevated blood-pressure reading, without diagnosis of hypertension: Secondary | ICD-10-CM | POA: Diagnosis present

## 2018-12-23 DIAGNOSIS — Z9851 Tubal ligation status: Secondary | ICD-10-CM

## 2018-12-23 DIAGNOSIS — Z79899 Other long term (current) drug therapy: Secondary | ICD-10-CM

## 2018-12-23 DIAGNOSIS — F0390 Unspecified dementia without behavioral disturbance: Secondary | ICD-10-CM | POA: Diagnosis present

## 2018-12-23 DIAGNOSIS — M797 Fibromyalgia: Secondary | ICD-10-CM | POA: Diagnosis present

## 2018-12-23 DIAGNOSIS — R4701 Aphasia: Secondary | ICD-10-CM | POA: Diagnosis present

## 2018-12-23 DIAGNOSIS — Z8619 Personal history of other infectious and parasitic diseases: Secondary | ICD-10-CM

## 2018-12-23 DIAGNOSIS — F209 Schizophrenia, unspecified: Secondary | ICD-10-CM | POA: Diagnosis present

## 2018-12-23 LAB — I-STAT CHEM 8, ED
BUN: 9 mg/dL (ref 8–23)
Calcium, Ion: 1.41 mmol/L — ABNORMAL HIGH (ref 1.15–1.40)
Chloride: 102 mmol/L (ref 98–111)
Creatinine, Ser: 0.7 mg/dL (ref 0.44–1.00)
Glucose, Bld: 138 mg/dL — ABNORMAL HIGH (ref 70–99)
HCT: 44 % (ref 36.0–46.0)
Hemoglobin: 15 g/dL (ref 12.0–15.0)
Potassium: 3.8 mmol/L (ref 3.5–5.1)
Sodium: 137 mmol/L (ref 135–145)
TCO2: 26 mmol/L (ref 22–32)

## 2018-12-23 LAB — PROTIME-INR
INR: 1 (ref 0.8–1.2)
Prothrombin Time: 13.2 seconds (ref 11.4–15.2)

## 2018-12-23 LAB — CBC
HCT: 42.9 % (ref 36.0–46.0)
Hemoglobin: 12.6 g/dL (ref 12.0–15.0)
MCH: 22.7 pg — ABNORMAL LOW (ref 26.0–34.0)
MCHC: 29.4 g/dL — ABNORMAL LOW (ref 30.0–36.0)
MCV: 77.3 fL — ABNORMAL LOW (ref 80.0–100.0)
Platelets: 367 10*3/uL (ref 150–400)
RBC: 5.55 MIL/uL — ABNORMAL HIGH (ref 3.87–5.11)
RDW: 16.9 % — ABNORMAL HIGH (ref 11.5–15.5)
WBC: 10.3 10*3/uL (ref 4.0–10.5)
nRBC: 0 % (ref 0.0–0.2)

## 2018-12-23 LAB — URINALYSIS, ROUTINE W REFLEX MICROSCOPIC
Bilirubin Urine: NEGATIVE
Glucose, UA: NEGATIVE mg/dL
Ketones, ur: NEGATIVE mg/dL
Nitrite: POSITIVE — AB
Protein, ur: NEGATIVE mg/dL
Specific Gravity, Urine: 1.005 (ref 1.005–1.030)
WBC, UA: >50 WBC/hpf — ABNORMAL HIGH (ref 0–5)
pH: 7 (ref 5.0–8.0)

## 2018-12-23 LAB — RAPID URINE DRUG SCREEN, HOSP PERFORMED
Amphetamines: NOT DETECTED
Barbiturates: NOT DETECTED
Benzodiazepines: NOT DETECTED
Cocaine: NOT DETECTED
Opiates: NOT DETECTED
Tetrahydrocannabinol: NOT DETECTED

## 2018-12-23 LAB — ETHANOL: Alcohol, Ethyl (B): <10 mg/dL (ref <10)

## 2018-12-23 LAB — COMPREHENSIVE METABOLIC PANEL
ALT: 32 U/L (ref 0–44)
AST: 23 U/L (ref 15–41)
Albumin: 4.2 g/dL (ref 3.5–5.0)
Alkaline Phosphatase: 81 U/L (ref 38–126)
Anion gap: 10 (ref 5–15)
BUN: 10 mg/dL (ref 8–23)
CO2: 23 mmol/L (ref 22–32)
Calcium: 10.6 mg/dL — ABNORMAL HIGH (ref 8.9–10.3)
Chloride: 102 mmol/L (ref 98–111)
Creatinine, Ser: 0.71 mg/dL (ref 0.44–1.00)
GFR calc Af Amer: >60 mL/min (ref >60)
GFR calc non Af Amer: >60 mL/min (ref >60)
Glucose, Bld: 142 mg/dL — ABNORMAL HIGH (ref 70–99)
Potassium: 3.7 mmol/L (ref 3.5–5.1)
Sodium: 135 mmol/L (ref 135–145)
Total Bilirubin: 0.5 mg/dL (ref 0.3–1.2)
Total Protein: 8.9 g/dL — ABNORMAL HIGH (ref 6.5–8.1)

## 2018-12-23 LAB — DIFFERENTIAL
Abs Immature Granulocytes: 0.05 10*3/uL (ref 0.00–0.07)
Basophils Absolute: 0 10*3/uL (ref 0.0–0.1)
Basophils Relative: 0 %
Eosinophils Absolute: 1.8 10*3/uL — ABNORMAL HIGH (ref 0.0–0.5)
Eosinophils Relative: 17 %
Immature Granulocytes: 1 %
Lymphocytes Relative: 23 %
Lymphs Abs: 2.4 10*3/uL (ref 0.7–4.0)
Monocytes Absolute: 0.7 10*3/uL (ref 0.1–1.0)
Monocytes Relative: 7 %
Neutro Abs: 5.4 10*3/uL (ref 1.7–7.7)
Neutrophils Relative %: 52 %

## 2018-12-23 LAB — SARS CORONAVIRUS 2 BY RT PCR (HOSPITAL ORDER, PERFORMED IN ~~LOC~~ HOSPITAL LAB): SARS Coronavirus 2: NEGATIVE

## 2018-12-23 LAB — APTT: aPTT: 34 seconds (ref 24–36)

## 2018-12-23 LAB — LITHIUM LEVEL: Lithium Lvl: 1.13 mmol/L (ref 0.60–1.20)

## 2018-12-23 MED ORDER — ACETAMINOPHEN 650 MG RE SUPP: 650.0000 mg | Freq: Four times a day (QID) | RECTAL | Status: DC | PRN

## 2018-12-23 MED ORDER — HYDRALAZINE HCL 20 MG/ML IJ SOLN: 5.0000 mg | INTRAMUSCULAR | Status: DC | PRN

## 2018-12-23 MED ORDER — ACETAMINOPHEN 325 MG PO TABS
650.0000 mg | ORAL_TABLET | Freq: Four times a day (QID) | ORAL | Status: DC | PRN
  Administered 2018-12-24 – 2018-12-31 (×10): 650 mg via ORAL
  Filled 2018-12-23 (×10): qty 2

## 2018-12-23 MED ORDER — SODIUM CHLORIDE 0.9 % IV SOLN
1.0000 g | Freq: Once | INTRAVENOUS | Status: AC
  Administered 2018-12-23: 1 g via INTRAVENOUS
  Filled 2018-12-23: qty 10

## 2018-12-23 MED ORDER — SODIUM CHLORIDE 0.9 % IV SOLN
1.0000 g | INTRAVENOUS | Status: DC
  Administered 2018-12-24 – 2018-12-25 (×2): 1 g via INTRAVENOUS
  Filled 2018-12-23 (×2): qty 1
  Filled 2018-12-23: qty 10

## 2018-12-23 MED ORDER — ALBUTEROL SULFATE (2.5 MG/3ML) 0.083% IN NEBU: 2.5000 mg | INHALATION_SOLUTION | RESPIRATORY_TRACT | Status: DC | PRN

## 2018-12-23 MED ORDER — ALBUTEROL SULFATE HFA 108 (90 BASE) MCG/ACT IN AERS: 1.0000 | INHALATION_SPRAY | RESPIRATORY_TRACT | Status: DC | PRN

## 2018-12-23 MED ORDER — NICOTINE 14 MG/24HR TD PT24
14.0000 mg | MEDICATED_PATCH | Freq: Every day | TRANSDERMAL | Status: DC
  Administered 2018-12-23 – 2018-12-30 (×8): 14 mg via TRANSDERMAL
  Filled 2018-12-23 (×8): qty 1

## 2018-12-23 MED ORDER — ENOXAPARIN SODIUM 40 MG/0.4ML ~~LOC~~ SOLN
40.0000 mg | SUBCUTANEOUS | Status: DC
  Administered 2018-12-23 – 2018-12-30 (×8): 40 mg via SUBCUTANEOUS
  Filled 2018-12-23 (×8): qty 0.4

## 2018-12-23 NOTE — ED Notes (Signed)
X-ray at bedside

## 2018-12-23 NOTE — H&P (Signed)
History and Physical    Karen Best [id number] DOB: 27-Oct-1955 DOA: 12/23/2018  PCP: [facility] Patient coming from: Home  Chief Complaint: "I had a stroke."  HPI: Karen Best is a 63 y.o. female with medical history significant of asthma, bipolar 1 disorder, schizophrenia, fibromyalgia, tobacco abuse presenting to the hospital with a chief complaint of "I had a stroke."  Patient states her neighbor thinks she had a stroke yesterday and today.  States she had a headache one time today but it resolved.  No headache at present.  Denies blurry vision, focal weakness, or numbness.  She is not sure if she is having dysuria, urinary frequency, or urgency.  No additional history could be obtained from the patient.  ED Course: Blood pressure 161/87, remainder of vitals stable.  No significant leukocytosis, not anemic, platelet count normal.  Renal function at baseline.  No major electrolyte abnormalities.  LFTs normal.  INR 1.0.  Blood ethanol level negative.  UDS pending.  SARS-CoV-2 testing pending.  UA with positive nitrite, large amount of leukocytes, greater than 50 WBCs, and many bacteria on microscopic examination.  Lithium level pending.  Chest x-ray showing minimal bibasilar atelectasis.  Head CT negative for acute intracranial process. Patient received ceftriaxone in the ED.  Review of Systems:  All systems reviewed and apart from history of presenting illness, are negative.  Past Medical History:  Diagnosis Date  . Asthma   . Bipolar 1 disorder ([place])   . Fibromyalgia   . Schizophrenia ([place])   . Tobacco abuse     Past Surgical History:  Procedure Laterality Date  . CESAREAN SECTION    . TUBAL LIGATION       reports that she has been smoking cigarettes. She has been smoking about 0.50 packs per day. She has never used smokeless tobacco. She reports current alcohol use. She reports current drug use. Drug: Cocaine.  Allergies  Allergen  Reactions  . Penicillins Other (See Comments)    Child hood reaction Has patient had a PCN reaction causing immediate rash, facial/tongue/throat swelling, SOB or lightheadedness with hypotension: Yes Has patient had a PCN reaction causing severe rash involving mucus membranes or skin necrosis: No Has patient had a PCN reaction that required hospitalization No Has patient had a PCN reaction occurring within the last 10 years: No If all of the above answers are "NO", then may proceed with C    Family History  Problem Relation Age of Onset  . Liver cancer Neg Hx   . Liver disease Neg Hx     Prior to Admission medications   Medication Sig Start Date End Date Taking? Authorizing Provider  cetirizine (ZYRTEC) 10 MG tablet Take 10 mg by mouth daily.    [provider]  famotidine (PEPCID) 40 MG tablet Take 40 mg by mouth 2 (two) times daily.    [provider]  hydrOXYzine (VISTARIL) 50 MG capsule Take 50 mg by mouth at bedtime.    [provider]  lithium carbonate (ESKALITH) 450 MG CR tablet Take 450 mg by mouth 2 (two) times daily.    [provider]  OLANZapine (ZYPREXA) 10 MG tablet Take 10 mg by mouth at bedtime. Take with olanzapine 20 mg to equal 30 mg    [provider]  OLANZapine (ZYPREXA) 20 MG tablet Take 20 mg by mouth at bedtime. Take with Olanzapine 10 mg to equal 30 mg    [provider]  [place]  Tosylate (INGREZZA) 80 MG CAPS Take 1 tablet by mouth at bedtime.     [provider]    Physical Exam: Vitals:   12/23/18 1816 12/23/18 1915 12/23/18 1930  BP:  (!) 161/87 (!) 147/80  Pulse:  63 65  Resp:  13 15  SpO2:  100% 100%  Weight: 63 kg      Physical Exam  Constitutional: No distress.  HENT:  Head: Normocephalic.  Mouth/Throat: Oropharynx is clear and moist.  Eyes: Pupils are equal, round, and reactive to light. Right eye exhibits no discharge. Left eye exhibits no discharge.  Unable to assess  extraocular movements due to lack of patient cooperation  Neck: Neck supple.  Cardiovascular: Normal rate, regular rhythm and intact distal pulses.  Pulmonary/Chest: Effort normal and breath sounds normal. No respiratory distress. She has no wheezes. She has no rales.  Abdominal: Soft. Bowel sounds are normal. She exhibits no distension. There is no abdominal tenderness. There is no guarding.  Musculoskeletal:        General: No edema.  Neurological: Coordination normal.  Awake and alert Following commands appropriately Speech slightly dysarthric Mild right facial droop Strength 5 out of 5 in bilateral upper and lower extremities. Sensation to light touch intact throughout.  Skin: Skin is warm and dry. She is not diaphoretic.     Labs on Admission: I have personally reviewed following labs and imaging studies  CBC: Recent Labs  Lab 12/23/18 1815 12/23/18 1829  WBC 10.3  --   NEUTROABS 5.4  --   HGB 12.6 15.0  HCT 42.9 44.0  MCV 77.3*  --   PLT 367  --    Basic Metabolic Panel: Recent Labs  Lab 12/23/18 1815 12/23/18 1829  NA 135 137  K 3.7 3.8  CL 102 102  CO2 23  --   GLUCOSE 142* 138*  BUN 10 9  CREATININE 0.71 0.70  CALCIUM 10.6*  --    GFR: Estimated Creatinine Clearance: 65.6 mL/min (by C-G formula based on SCr of 0.7 mg/dL). Liver Function Tests: Recent Labs  Lab 12/23/18 1815  AST 23  ALT 32  ALKPHOS 81  BILITOT 0.5  PROT 8.9*  ALBUMIN 4.2   No results for input(s): LIPASE, AMYLASE in the last 168 hours. No results for input(s): AMMONIA in the last 168 hours. Coagulation Profile: Recent Labs  Lab 12/23/18 1815  INR 1.0   Cardiac Enzymes: No results for input(s): CKTOTAL, CKMB, CKMBINDEX, TROPONINI in the last 168 hours. BNP (last 3 results) No results for input(s): PROBNP in the last 8760 hours. HbA1C: No results for input(s): HGBA1C in the last 72 hours. CBG: No results for input(s): GLUCAP in the last 168 hours. Lipid Profile: No  results for input(s): CHOL, HDL, LDLCALC, TRIG, CHOLHDL, LDLDIRECT in the last 72 hours. Thyroid Function Tests: No results for input(s): TSH, T4TOTAL, FREET4, T3FREE, THYROIDAB in the last 72 hours. Anemia Panel: No results for input(s): VITAMINB12, FOLATE, FERRITIN, TIBC, IRON, RETICCTPCT in the last 72 hours. Urine analysis:    Component Value Date/Time   COLORURINE YELLOW 12/23/2018 1841   APPEARANCEUR HAZY (A) 12/23/2018 1841   LABSPEC 1.005 12/23/2018 1841   PHURINE 7.0 12/23/2018 1841   GLUCOSEU NEGATIVE 12/23/2018 1841   HGBUR SMALL (A) 12/23/2018 1841   BILIRUBINUR NEGATIVE 12/23/2018 1841   KETONESUR NEGATIVE 12/23/2018 1841   PROTEINUR NEGATIVE 12/23/2018 1841   UROBILINOGEN 0.2 10/26/2018 1355   NITRITE POSITIVE (A) 12/23/2018 1841   LEUKOCYTESUR LARGE (A) 12/23/2018 1841  Radiological Exams on Admission: Ct Head Wo Contrast  Result Date: 12/23/2018 CLINICAL DATA:  Pt has been having difficulty with her speech since last night. Pt has weakness bilateral legs, worse in right leg. EXAM: CT HEAD WITHOUT CONTRAST TECHNIQUE: Contiguous axial images were obtained from the base of the skull through the vertex without intravenous contrast. COMPARISON:  CT head 12/05/2018 FINDINGS: Brain: No evidence of acute infarction, hemorrhage, hydrocephalus, extra-axial collection or mass lesion/mass effect. Vascular: No hyperdense vessel or unexpected calcification. Skull: Normal. Negative for fracture or focal lesion. Sinuses/Orbits: No acute finding. Other: Chronic right mastoid effusion IMPRESSION: No acute intracranial process. Electronically Signed   By: Audie Pinto M.D.   On: 12/23/2018 18:53   Dg Chest Port 1 View  Result Date: 12/23/2018 CLINICAL DATA:  Altered mental status EXAM: PORTABLE CHEST 1 VIEW COMPARISON:  09/05/2018 FINDINGS: Heart is normal size. Bibasilar atelectasis. No effusions or acute bony abnormality. IMPRESSION: Minimal bibasilar atelectasis. Electronically  Signed   By: Rolm Baptise M.D.   On: 12/23/2018 18:42    EKG: Independently reviewed.  Normal sinus rhythm.  Assessment/Plan Principal Problem:   UTI (urinary tract infection) Active Problems:   Acute metabolic encephalopathy   Dysarthria   Elevated blood pressure reading   Asthma   UTI Afebrile and no significant leukocytosis. UA with positive nitrite, large amount of leukocytes, greater than 50 WBCs, and many bacteria on microscopic examination. Previous urine culture from December 06, 2018 growing E. coli sensitive to ceftriaxone. -Continue ceftriaxone.  Penicillin was listed as an allergy in 2014, however, per chart review patient has received ceftriaxone's multiple times over the past few years with no adverse reaction. -Urine culture  Acute metabolic encephalopathy Likely secondary to UTI.  Blood ethanol level negative. -Management of UTI as mentioned above -UDS pending  Dysarthria, mild right-sided facial droop Patient has had 2 previous brain MRIs and 2 head CTs done recently on May 2020 for evaluation of leg weakness and slurred speech which were negative for stroke.  Most recent MRI done 8/9 negative for stroke.  Repeat head CT done today again negative for acute intracranial abnormality.  Speech slightly dysarthric and has mild right sided facial droop, unclear what her baseline is.  No focal weakness or sensory deficit on exam. -Discussed with neurology. Will order repeat brain MRI to rule out stroke.  Elevated blood pressure Blood pressure 161/87.  No documented history of hypertension. -Monitor blood pressure -Hydralazine PRN SBP >150  Asthma -Stable.  No wheezing or shortness of breath.  Albuterol inhaler as needed.  Bipolar 1 disorder, schizophrenia -Not clear which medications the patient is currently taking.  Pharmacy med rec pending.  Tobacco abuse -Counseling -NicoDerm patch  DVT prophylaxis: Lovenox Code Status: Full code Family Communication: No family  available. Disposition Plan: Anticipate discharge in 1 to 2 days. Consults called: Discussed case with Dr. Rory Percy from neurology. Admission status: It is my clinical opinion that referral for OBSERVATION is reasonable and necessary in this patient based on the above information provided. The aforementioned taken together are felt to place the patient at high risk for further clinical deterioration. However it is anticipated that the patient may be medically stable for discharge from the hospital within 24 to 48 hours.  The medical decision making on this patient was of high complexity and the patient is at high risk for clinical deterioration, therefore this is a level 3 visit.  Shela Leff MD [facility] Pager [phone]  If 7PM-7AM, please contact night-coverage  [email or web address] Password [facility]  12/23/2018, 7:57 PM

## 2018-12-23 NOTE — ED Notes (Signed)
Pt able to void using bed pan.  UA and Urine culture collected, sent to lab.

## 2018-12-23 NOTE — ED Notes (Signed)
Patient transported to CT 

## 2018-12-23 NOTE — ED Provider Notes (Signed)
[facility] DEPT Provider Note   CSN: [id number] Arrival date & time: 12/23/18  1755     History   Chief Complaint Chief Complaint  Patient presents with  . Aphasia  . Weakness    HPI Karen Best is a 63 y.o. female hx of bipolar, recurrent UTI with delirium, presenting with altered mental status, trouble speaking, confusion.  Symptoms started yesterday.  Patient states that she has some trouble coming up with some words.  Patient had a telehealth visit with [facility] this morning and they felt that she was very confused.  The visiting nurse went to check in on her and she was noted to be confused so EMS was called.  She had similar symptoms recently and had a MRI that showed no stroke .  She was diagnosed with urinary tract infection and finished a course of antibiotics.      The history is provided by the patient and a caregiver.    Past Medical History:  Diagnosis Date  . Asthma   . Bipolar 1 disorder ([place])   . Fibromyalgia   . Schizophrenia ([place])   . Tobacco abuse     Patient Active Problem List   Diagnosis Date Noted  . Delirium due to another medical condition   . UTI (urinary tract infection) 12/06/2018  . Acute metabolic encephalopathy [id number]  . 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 Psychosis ([place]) 09/16/2013  . Schizophrenia [facility])     Past Surgical History:  Procedure Laterality Date  . CESAREAN SECTION    . TUBAL LIGATION       OB History   No obstetric history on file.      Home Medications    Prior to Admission medications   Medication Sig Start Date End Date Taking? Authorizing Provider  cetirizine (ZYRTEC) 10 MG tablet Take 10 mg by mouth daily.    [provider]  famotidine (PEPCID) 40 MG tablet Take 40 mg by mouth 2 (two) times daily.    [provider]  hydrOXYzine (VISTARIL) 50 MG capsule Take 50 mg by mouth at bedtime.    [provider]  lithium carbonate  (ESKALITH) 450 MG CR tablet Take 450 mg by mouth 2 (two) times daily.    [provider]  OLANZapine (ZYPREXA) 10 MG tablet Take 10 mg by mouth at bedtime. Take with olanzapine 20 mg to equal 30 mg    [provider]  OLANZapine (ZYPREXA) 20 MG tablet Take 20 mg by mouth at bedtime. Take with Olanzapine 10 mg to equal 30 mg    [provider]  Valbenazine Tosylate (INGREZZA) 80 MG CAPS Take 1 tablet by mouth at bedtime.     [provider]    Family History Family History  Problem Relation Age of Onset  . Liver cancer Neg Hx   . Liver disease Neg Hx     Social History Social History   Tobacco Use  . Smoking status: Current Every Day Smoker    Packs/day: 0.50    Types: Cigarettes  . Smokeless tobacco: Never Used  Substance Use Topics  . Alcohol use: Yes    Comment: occ  . Drug use: Yes    Types: Cocaine    Comment: crack cocaine (last use 10/25/2018)     Allergies   Penicillins   Review of Systems Review of Systems  Psychiatric/Behavioral: Positive for confusion.  All other systems reviewed and are negative.    Physical Exam  Updated Vital Signs BP (!) 161/87   Pulse 63   Resp 13   Wt 63 kg   SpO2 100%   BMI 23.11 kg/m   Physical Exam Vitals signs and nursing note reviewed.  Constitutional:      Comments: Confused   HENT:     Head: Normocephalic.     Nose: Nose normal.     Mouth/Throat:     Mouth: Mucous membranes are dry.  Eyes:     Extraocular Movements: Extraocular movements intact.     Pupils: Pupils are equal, round, and reactive to light.  Neck:     Musculoskeletal: Normal range of motion.  Cardiovascular:     Rate and Rhythm: Normal rate and regular rhythm.     Pulses: Normal pulses.     Heart sounds: Normal heart sounds.  Pulmonary:     Effort: Pulmonary effort is normal.     Breath sounds: Normal breath sounds.  Abdominal:     General: Abdomen is flat.     Palpations: Abdomen is soft.   Musculoskeletal: Normal range of motion.  Skin:    General: Skin is warm.     Capillary Refill: Capillary refill takes less than 2 seconds.  Neurological:     Mental Status: She is alert.     Comments: A & O x 2. Some expressive aphasia. CN 2- 12 intact. Strength 4/5 throughout. Nl finger to nose bilaterally   Psychiatric:     Comments: Unable       ED Treatments / Results  Labs (all labs ordered are listed, but only abnormal results are displayed) Labs Reviewed  CBC - Abnormal; Notable for the following components:      Result Value   RBC 5.55 (*)    MCV 77.3 (*)    MCH 22.7 (*)    MCHC 29.4 (*)    RDW 16.9 (*)    All other components within normal limits  DIFFERENTIAL - Abnormal; Notable for the following components:   Eosinophils Absolute 1.8 (*)    All other components within normal limits  COMPREHENSIVE METABOLIC PANEL - Abnormal; Notable for the following components:   Glucose, Bld 142 (*)    Calcium 10.6 (*)    Total Protein 8.9 (*)    All other components within normal limits  URINALYSIS, ROUTINE W REFLEX MICROSCOPIC - Abnormal; Notable for the following components:   APPearance HAZY (*)    Hgb urine dipstick SMALL (*)    Nitrite POSITIVE (*)    Leukocytes,Ua LARGE (*)    WBC, UA >50 (*)    Bacteria, UA MANY (*)    All other components within normal limits  I-STAT CHEM 8, ED - Abnormal; Notable for the following components:   Glucose, Bld 138 (*)    Calcium, Ion 1.41 (*)    All other components within normal limits  SARS CORONAVIRUS 2 (HOSPITAL ORDER, [facility])  PROTIME-INR  APTT  ETHANOL  RAPID URINE DRUG SCREEN, HOSP PERFORMED  LITHIUM LEVEL    EKG EKG Interpretation  Date/Time:  Thursday December 23 2018 18:20:34 EDT Ventricular Rate:  62 PR Interval:    QRS Duration: 96 QT Interval:  405 QTC Calculation: 412 R Axis:   -9 Text Interpretation:  Sinus rhythm Abnormal R-wave progression, early transition Minimal ST  elevation, anterior leads No significant change since last tracing Confirmed by Wandra Arthurs [phone]) on 12/23/2018 7:15:41 PM   Radiology Ct Head Wo Contrast  Result Date:  12/23/2018 CLINICAL DATA:  Pt has been having difficulty with her speech since last night. Pt has weakness bilateral legs, worse in right leg. EXAM: CT HEAD WITHOUT CONTRAST TECHNIQUE: Contiguous axial images were obtained from the base of the skull through the vertex without intravenous contrast. COMPARISON:  CT head 12/05/2018 FINDINGS: Brain: No evidence of acute infarction, hemorrhage, hydrocephalus, extra-axial collection or mass lesion/mass effect. Vascular: No hyperdense vessel or unexpected calcification. Skull: Normal. Negative for fracture or focal lesion. Sinuses/Orbits: No acute finding. Other: Chronic right mastoid effusion IMPRESSION: No acute intracranial process. Electronically Signed   By: Audie Pinto M.D.   On: 12/23/2018 18:53   Dg Chest Port 1 View  Result Date: 12/23/2018 CLINICAL DATA:  Altered mental status EXAM: PORTABLE CHEST 1 VIEW COMPARISON:  09/05/2018 FINDINGS: Heart is normal size. Bibasilar atelectasis. No effusions or acute bony abnormality. IMPRESSION: Minimal bibasilar atelectasis. Electronically Signed   By: Rolm Baptise M.D.   On: 12/23/2018 18:42    Procedures Procedures (including critical care time)  Medications Ordered in ED Medications  cefTRIAXone (ROCEPHIN) 1 g in sodium chloride 0.9 % 100 mL IVPB (has no administration in time range)     Initial Impression / Assessment and Plan / ED Course  I have reviewed the triage vital signs and the nursing notes.  Pertinent labs & imaging results that were available during my care of the patient were reviewed by me and considered in my medical decision making (see chart for details).       Karen Best is a 63 y.o. female presenting with confusion, expressive aphasia.  She had similar symptoms with a UTI recently. Recent  urine culture showed E. Coli  sensitive to Rocephin.  She is outside of TPA window already and does not meet LVO criteria. Will get labs, CT head, UA.   7:30 PM CT head unremarkable. UA + UTI again. Given rocephin. Will admit for encephalopathy from UTI. Doesn't appear septic clinically    Final Clinical Impressions(s) / ED Diagnoses   Final diagnoses:  None    ED Discharge Orders    None       Drenda Freeze, MD 12/23/18 [phone]

## 2018-12-23 NOTE — ED Triage Notes (Signed)
Pt arrives from home with woman- not daughter. Pt has been having difficulty with her speech since last night. Pt has weakness bilateral legs, worse in right leg.

## 2018-12-24 ENCOUNTER — Other Ambulatory Visit: Payer: Self-pay

## 2018-12-24 DIAGNOSIS — G92 Toxic encephalopathy: Secondary | ICD-10-CM | POA: Insufficient documentation

## 2018-12-24 DIAGNOSIS — G9340 Encephalopathy, unspecified: Secondary | ICD-10-CM | POA: Diagnosis present

## 2018-12-24 DIAGNOSIS — R471 Dysarthria and anarthria: Secondary | ICD-10-CM | POA: Diagnosis present

## 2018-12-24 DIAGNOSIS — R030 Elevated blood-pressure reading, without diagnosis of hypertension: Secondary | ICD-10-CM | POA: Diagnosis present

## 2018-12-24 DIAGNOSIS — Z8619 Personal history of other infectious and parasitic diseases: Secondary | ICD-10-CM | POA: Diagnosis not present

## 2018-12-24 DIAGNOSIS — Z79899 Other long term (current) drug therapy: Secondary | ICD-10-CM | POA: Diagnosis not present

## 2018-12-24 DIAGNOSIS — F0390 Unspecified dementia without behavioral disturbance: Secondary | ICD-10-CM | POA: Diagnosis present

## 2018-12-24 DIAGNOSIS — J45909 Unspecified asthma, uncomplicated: Secondary | ICD-10-CM | POA: Diagnosis present

## 2018-12-24 DIAGNOSIS — J358 Other chronic diseases of tonsils and adenoids: Secondary | ICD-10-CM | POA: Diagnosis present

## 2018-12-24 DIAGNOSIS — Z9851 Tubal ligation status: Secondary | ICD-10-CM | POA: Diagnosis not present

## 2018-12-24 DIAGNOSIS — G928 Other toxic encephalopathy: Secondary | ICD-10-CM | POA: Insufficient documentation

## 2018-12-24 DIAGNOSIS — R4182 Altered mental status, unspecified: Secondary | ICD-10-CM | POA: Diagnosis not present

## 2018-12-24 DIAGNOSIS — R410 Disorientation, unspecified: Secondary | ICD-10-CM | POA: Diagnosis not present

## 2018-12-24 DIAGNOSIS — Z20828 Contact with and (suspected) exposure to other viral communicable diseases: Secondary | ICD-10-CM | POA: Diagnosis present

## 2018-12-24 DIAGNOSIS — F17210 Nicotine dependence, cigarettes, uncomplicated: Secondary | ICD-10-CM | POA: Diagnosis present

## 2018-12-24 DIAGNOSIS — F250 Schizoaffective disorder, bipolar type: Secondary | ICD-10-CM | POA: Diagnosis not present

## 2018-12-24 DIAGNOSIS — Z880 Allergy status to penicillin: Secondary | ICD-10-CM | POA: Diagnosis not present

## 2018-12-24 DIAGNOSIS — N3000 Acute cystitis without hematuria: Secondary | ICD-10-CM | POA: Diagnosis not present

## 2018-12-24 DIAGNOSIS — R4701 Aphasia: Secondary | ICD-10-CM | POA: Diagnosis present

## 2018-12-24 DIAGNOSIS — F319 Bipolar disorder, unspecified: Secondary | ICD-10-CM | POA: Diagnosis present

## 2018-12-24 DIAGNOSIS — Z87440 Personal history of urinary (tract) infections: Secondary | ICD-10-CM | POA: Diagnosis not present

## 2018-12-24 DIAGNOSIS — F209 Schizophrenia, unspecified: Secondary | ICD-10-CM | POA: Diagnosis present

## 2018-12-24 DIAGNOSIS — R29810 Facial weakness: Secondary | ICD-10-CM | POA: Diagnosis present

## 2018-12-24 DIAGNOSIS — M797 Fibromyalgia: Secondary | ICD-10-CM | POA: Diagnosis present

## 2018-12-24 LAB — COMPREHENSIVE METABOLIC PANEL
ALT: 27 U/L (ref 0–44)
AST: 21 U/L (ref 15–41)
Albumin: 3.2 g/dL — ABNORMAL LOW (ref 3.5–5.0)
Alkaline Phosphatase: 69 U/L (ref 38–126)
Anion gap: 6 (ref 5–15)
BUN: 7 mg/dL — ABNORMAL LOW (ref 8–23)
CO2: 23 mmol/L (ref 22–32)
Calcium: 9.8 mg/dL (ref 8.9–10.3)
Chloride: 107 mmol/L (ref 98–111)
Creatinine, Ser: 0.74 mg/dL (ref 0.44–1.00)
GFR calc Af Amer: >60 mL/min (ref >60)
GFR calc non Af Amer: >60 mL/min (ref >60)
Glucose, Bld: 105 mg/dL — ABNORMAL HIGH (ref 70–99)
Potassium: 4.1 mmol/L (ref 3.5–5.1)
Sodium: 136 mmol/L (ref 135–145)
Total Bilirubin: 0.7 mg/dL (ref 0.3–1.2)
Total Protein: 7.3 g/dL (ref 6.5–8.1)

## 2018-12-24 LAB — CBC
HCT: 38.3 % (ref 36.0–46.0)
Hemoglobin: 11.5 g/dL — ABNORMAL LOW (ref 12.0–15.0)
MCH: 23.6 pg — ABNORMAL LOW (ref 26.0–34.0)
MCHC: 30 g/dL (ref 30.0–36.0)
MCV: 78.5 fL — ABNORMAL LOW (ref 80.0–100.0)
Platelets: 310 10*3/uL (ref 150–400)
RBC: 4.88 MIL/uL (ref 3.87–5.11)
RDW: 16.5 % — ABNORMAL HIGH (ref 11.5–15.5)
WBC: 9.7 10*3/uL (ref 4.0–10.5)
nRBC: 0 % (ref 0.0–0.2)

## 2018-12-24 NOTE — Progress Notes (Signed)
PROGRESS NOTE  Karen Best [id number] DOB: 04-Feb-1956 DOA: 12/23/2018 PCP: [facility]  Brief History   63 bipolar, shcizophre, asthma, hepatitis C treated [place] Underlying chronic dementia recent admission 8/10 through 8/20-at that admission psychiatry saw the patient---Felt she should continue her psychotropics and felt at that admission she had a UTI causing altered mental status-her home meds are Zyprexa 30 lithium 450 [address]  Admission details are somewhat sparse-I called [name] the patient's psych nurse and got the report that patient has been intermittently leaving on the stove, sleeping with the door open and sleeping naked in the front of the house and has had significant odd behaviors  She also was found on 8/27 to have drooling unresponsiveness and lethargy-unclear precipitating of the event-[name] tells me she brought the patient to the hospital--her usual mental status that patient is coherent and able to discuss things   A & P   toxic metabolic encephalopathy Have to rule out stroke-MRI machine here at [facility] long still not working so await study Unclear if clean-catch urine I will continue Rocephin but circumscribe antibiotics as it is unlikely that she has a UTI given lack of symptoms today Have asked psychiatry to see the patient in consult regarding capacity as in the past she has refused placement and she is exhibiting unsafe and has addressed behaviors to herself-May need to petition social work to see if she should become a ward of the state? Patient exhibits physical exam that is somewhat inconsistent and inconsistently walked and moved around with therapy versus nurse tech in the room-she was able to ambulate completely independently with nurse tech but then seemed to have more difficulty with physical therapy out of proportion to what was reported by nurse tech  ?  Cocaine, EtOH Urine tox negative but does have a history of the  same  Schizophrenia Await psych eval continuing Zyprexa 30 lithium 450 [address] 80 seems to be stable at this time     DVT prophylaxis: Lovenox Code Status: Full code presumed Family Communication: No family however I spoke to the psych nurse in detail Disposition Plan: Inpatient pending resolution   [name], MD [facility] 10:24 AM  12/24/2018, 10:24 AM  LOS: 0 days   Consultants  . Psychiatry  Procedures  . Awaiting MRI  Antibiotics  . Rocephin  Interval History/Subjective  Flat affect on initial evaluation this morning when I see her in the afternoon she is completely coherent has no facial twisting and tells me she wants to go home  Objective   Vitals:  Vitals:   12/24/18 0607 12/24/18 1013  BP: (!) 111/51 134/69  Pulse: (!) 56 (!) 59  Resp:  17  Temp:  98 F (36.7 C)  SpO2: 100% 100%    Exam:  EOMI NCAT facial droop has improved from the right side chest is clear no added sound finger-nose-finger intact she can orient to place person Abdomen soft nontender no rebound No lower extremity edema   I have personally reviewed the following:   Today's Data  .   Lab Data  . BUN/creatinine 7/0.7 Albumin 3.2 . Hemoglobin 11.5  Urine cultures pending  Scheduled Meds: . enoxaparin (LOVENOX) injection  40 mg Subcutaneous Q24H  . nicotine  14 mg Transdermal QHS   Continuous Infusions: . cefTRIAXone (ROCEPHIN)  IV      Principal Problem:   UTI (urinary tract infection) Active Problems:   Acute metabolic encephalopathy   Dysarthria   Elevated  blood pressure reading   Asthma   LOS: 0 days   How to contact the [facility] Attending or Consulting provider [place] or covering provider during after hours [place], for this patient?  1. Check the care team in [facility] and look for a) attending/consulting [facility] provider listed and b) the [facility] team listed 2. Log into [email or web address] and use [place]'s universal password to access. If you do not have the  password, please contact the hospital operator. 3. Locate the [place] provider you are looking for under [facility] and page to a number that you can be directly reached. 4. If you still have difficulty reaching the provider, please page the [facility] (Director on Call) for the Hospitalists listed on amion for assistance.

## 2018-12-24 NOTE — Evaluation (Signed)
Physical Therapy Evaluation Patient Details Name: Karen Best MRN: [id number] DOB: [birth year] Today's Date: 12/24/2018   History of Present Illness  63 y.o. female with medical history significant of asthma, bipolar 1 disorder, schizophrenia, fibromyalgia, hep C, tobacco abuse presenting to the hospital with a chief complaint of "I had a stroke."  Pt admitted for UTI and acute metabolic encephalopathy.  Recent admission a week ago for same.  Clinical Impression  Pt admitted with above diagnosis. Pt currently with functional limitations due to the deficits listed below (see PT Problem List). Pt will benefit from skilled PT to increase their independence and safety with mobility to allow discharge to the venue listed below.  Pt presents with decreased cognition and required min assist for standing and short distance ambulation.  Pt presents with shuffling gait and states she doesn't use an assistive device at baseline.  Pt lives alone.  Would recommend 24/7 assist if home.     Follow Up Recommendations SNF;Supervision/Assistance - 24 hour    Equipment Recommendations  Rolling walker with 5" wheels    Recommendations for Other Services       Precautions / Restrictions Precautions Precautions: Fall      Mobility  Bed Mobility Overal bed mobility: Needs Assistance Bed Mobility: Supine to Sit     Supine to sit: Mod assist     General bed mobility comments: pt sleeping on arrival, not resistant to assist, required manual cues to initiate but assisting once started  Transfers Overall transfer level: Needs assistance Equipment used: Rolling walker (2 wheeled) Transfers: Sit to/from Stand Sit to Stand: Min assist         General transfer comment: assist to initiate standing; assist to steady upon standing, pt did reach for RW to use prior to standing  Ambulation/Gait Ambulation/Gait assistance: Min assist;+2 safety/equipment Gait Distance (Feet): 10 Feet Assistive  device: Rolling walker (2 wheeled) Gait Pattern/deviations: Shuffle;Step-to pattern;Narrow base of support Gait velocity: Decreased   General Gait Details: assist for stabilizing, pt ambulated short distance and then reported need to urinate (provided bed pan on recliner (fastest option))  pt rolled back into room for standing again for hygiene  Stairs            Wheelchair Mobility    Modified Rankin (Stroke Patients Only)       Balance Overall balance assessment: Needs assistance         Standing balance support: Bilateral upper extremity supported Standing balance-Leahy Scale: Poor Standing balance comment: Reliant on UE and external support.                              Pertinent Vitals/Pain Pain Assessment: Faces Faces Pain Scale: No hurt Pain Intervention(s): Repositioned    Home Living Family/patient expects to be discharged to:: Private residence Living Arrangements: Alone Available Help at Discharge: Personal care attendant;Available PRN/intermittently([employer]) Type of Home: Apartment Home Access: Level entry     Home Layout: One level Home Equipment: None Additional Comments: per last admission a couple weeks ago, pt not very talkative this session    Prior Function Level of Independence: Independent         Comments: Pt reports she was independent with mobility.      Hand Dominance   Dominant Hand: Right    Extremity/Trunk Assessment   Upper Extremity Assessment Upper Extremity Assessment: Generalized weakness    Lower Extremity Assessment Lower Extremity Assessment: Generalized weakness  Cervical / Trunk Assessment Cervical / Trunk Assessment: Normal  Communication   Communication: Expressive difficulties  Cognition Arousal/Alertness: Awake/alert Behavior During Therapy: Flat affect Overall Cognitive Status: No family/caregiver present to determine baseline cognitive functioning Area of Impairment:  Following commands;Safety/judgement;Problem solving                       Following Commands: Follows one step commands with increased time Safety/Judgement: Decreased awareness of safety;Decreased awareness of deficits   Problem Solving: Slow processing;Requires verbal cues;Decreased initiation General Comments: pt not very expressive, required manual cues to initiate      General Comments      Exercises     Assessment/Plan    PT Assessment Patient needs continued PT services  PT Problem List Decreased strength;Decreased balance;Decreased mobility;Decreased cognition;Decreased knowledge of use of DME;Decreased safety awareness;Decreased knowledge of precautions;Decreased activity tolerance       PT Treatment Interventions DME instruction;Gait training;Functional mobility training;Therapeutic activities;Therapeutic exercise;Balance training;Patient/family education;Cognitive remediation    PT Goals (Current goals can be found in the Care Plan section)  Acute Rehab PT Goals PT Goal Formulation: With patient Time For Goal Achievement: 01/06/19 Potential to Achieve Goals: Good    Frequency Min 2X/week   Barriers to discharge        Co-evaluation               AM-PAC PT "6 Clicks" Mobility  Outcome Measure Help needed turning from your back to your side while in a flat bed without using bedrails?: None Help needed moving from lying on your back to sitting on the side of a flat bed without using bedrails?: A Little Help needed moving to and from a bed to a chair (including a wheelchair)?: A Little Help needed standing up from a chair using your arms (e.g., wheelchair or bedside chair)?: A Little Help needed to walk in hospital room?: A Little Help needed climbing 3-5 steps with a railing? : A Lot 6 Click Score: 18    End of Session Equipment Utilized During Treatment: Gait belt Activity Tolerance: Patient tolerated treatment well Patient left: with call  bell/phone within reach;in chair;with chair alarm set Nurse Communication: Mobility status PT Visit Diagnosis: Unsteadiness on feet (R26.81);Muscle weakness (generalized) (M62.81)    Time: 1350-1410 PT Time Calculation (min) (ACUTE ONLY): 20 min   Charges:   PT Evaluation $PT Eval Low Complexity: [place], PT, DPT Acute Rehabilitation Services Office: [phone] Pager: [phone]  Trena Platt 12/24/2018, 3:04 PM

## 2018-12-25 ENCOUNTER — Inpatient Hospital Stay (HOSPITAL_COMMUNITY): Payer: Medicaid Other

## 2018-12-25 DIAGNOSIS — R4182 Altered mental status, unspecified: Secondary | ICD-10-CM

## 2018-12-25 DIAGNOSIS — F250 Schizoaffective disorder, bipolar type: Secondary | ICD-10-CM

## 2018-12-25 LAB — URINE CULTURE: Culture: NO GROWTH

## 2018-12-25 MED ORDER — TRAZODONE HCL 50 MG PO TABS
50.0000 mg | ORAL_TABLET | Freq: Once | ORAL | Status: AC
  Administered 2018-12-25: 50 mg via ORAL
  Filled 2018-12-25: qty 1

## 2018-12-25 MED ORDER — LITHIUM CARBONATE ER 300 MG PO TBCR
300.0000 mg | EXTENDED_RELEASE_TABLET | Freq: Two times a day (BID) | ORAL | Status: DC
  Administered 2018-12-25 – 2018-12-31 (×13): 300 mg via ORAL
  Filled 2018-12-25 (×14): qty 1

## 2018-12-25 MED ORDER — VALBENAZINE TOSYLATE 80 MG PO CAPS
1.0000 | ORAL_CAPSULE | Freq: Every day | ORAL | Status: DC
  Filled 2018-12-25: qty 1

## 2018-12-25 MED ORDER — OLANZAPINE 10 MG PO TABS
20.0000 mg | ORAL_TABLET | Freq: Every day | ORAL | Status: DC
  Administered 2018-12-25 – 2018-12-30 (×6): 20 mg via ORAL
  Filled 2018-12-25 (×7): qty 2

## 2018-12-25 MED ORDER — LITHIUM CARBONATE ER 450 MG PO TBCR
450.0000 mg | EXTENDED_RELEASE_TABLET | Freq: Two times a day (BID) | ORAL | Status: DC
  Filled 2018-12-25 (×2): qty 1

## 2018-12-25 NOTE — Consult Note (Signed)
Telepsych Consultation   Reason for Consult: ''capacity and medication management.'' Referring Physician:  Dr. Verlon Au Location of Patient: [facility]  Location of Provider: [facility]  Patient Identification: Karen Best MRN:  [id number] Principal Diagnosis: UTI (urinary tract infection) Diagnosis:  Principal Problem:   UTI (urinary tract infection) Active Problems:   Acute metabolic encephalopathy   Dysarthria   Elevated blood pressure reading   Asthma   Toxic metabolic encephalopathy   Total Time spent with patient: 45 minutes  Subjective:   Karen Best is a 63 y.o. female patient admitted with altered mental status  HPI: Patient with history of Bipolar disorder, Schizophrenia, Asthma, hepatitis C treated Mavyret, chronic dementia, recurrent UTI who was admitted with altered mental status, confusions and trouble finding her words. Patient medications for psychiatric disorder includes: Zyprexa 30 mg, Lithium XR 450 MG BID, Ingrezza 80 mg. Today patient is alert, cooperative, denies psychosis, delusions or suicidal thoughts. She denies drugs and alcohol abuse.Utox-negative, Urinalysis done on 12/23/18 showed many bacteria and Lithium level-1.13-therapeutic. Most likely patient confusion is mainly due to recurrent UTI and her home medications needs review. Patient scored 20/30 on mini mental status. She also agreed to be placed outside of her home if necessary. However, due to dementia, psychiatric problem, recurrent altered mental status, a score of 20/30 on mini mental status and my evaluation today, patient does not have capacity to make informed decision.   Past Psychiatric History: as above  Risk to Self:  denies  Risk to Others:   denies Prior Inpatient Therapy:   Prior Outpatient Therapy:  [facility]  Past Medical History:  Past Medical History:  Diagnosis Date  . Asthma   . Bipolar 1 disorder ([place])   . Fibromyalgia   . Schizophrenia ([place])   . Tobacco  abuse     Past Surgical History:  Procedure Laterality Date  . CESAREAN SECTION    . TUBAL LIGATION     Family History:  Family History  Problem Relation Age of Onset  . Liver cancer Neg Hx   . Liver disease Neg Hx    Family Psychiatric  History:  Social History:  Social History   Substance and Sexual Activity  Alcohol Use Yes   Comment: occ     Social History   Substance and Sexual Activity  Drug Use Yes  . Types: Cocaine   Comment: crack cocaine (last use 10/25/2018)    Social History   Socioeconomic History  . Marital status: Legally Separated    Spouse name: Not on file  . Number of children: Not on file  . Years of education: Not on file  . Highest education level: Not on file  Occupational History  . Not on file  Social Needs  . Financial resource strain: Not on file  . Food insecurity    Worry: Not on file    Inability: Not on file  . Transportation needs    Medical: Not on file    Non-medical: Not on file  Tobacco Use  . Smoking status: Current Every Day Smoker    Packs/day: 0.50    Types: Cigarettes  . Smokeless tobacco: Never Used  Substance and Sexual Activity  . Alcohol use: Yes    Comment: occ  . Drug use: Yes    Types: Cocaine    Comment: crack cocaine (last use 10/25/2018)  . Sexual activity: Yes    Birth control/protection: None  Lifestyle  . Physical activity    Days per week:  Not on file    Minutes per session: Not on file  . Stress: Not on file  Relationships  . Social Herbalist on phone: Not on file    Gets together: Not on file    Attends religious service: Not on file    Active member of club or organization: Not on file    Attends meetings of clubs or organizations: Not on file    Relationship status: Not on file  Other Topics Concern  . Not on file  Social History Narrative  . Not on file   Additional Social History:    Allergies:   Allergies  Allergen Reactions  . Penicillins Other (See Comments)     Child hood reaction Has patient had a PCN reaction causing immediate rash, facial/tongue/throat swelling, SOB or lightheadedness with hypotension: Yes Has patient had a PCN reaction causing severe rash involving mucus membranes or skin necrosis: No Has patient had a PCN reaction that required hospitalization No Has patient had a PCN reaction occurring within the last 10 years: No If all of the above answers are "NO", then may proceed with C    Labs:  Results for orders placed or performed during the hospital encounter of 12/23/18 (from the past 48 hour(s))  Ethanol     Status: None   Collection Time: 12/23/18  6:15 PM  Result Value Ref Range   Alcohol, Ethyl (B) <10 <10 mg/dL    Comment: (NOTE) Lowest detectable limit for serum alcohol is 10 mg/dL. For medical purposes only. Performed at [facility], [place] [address], [place], [place] [address]   Protime-INR     Status: None   Collection Time: 12/23/18  6:15 PM  Result Value Ref Range   Prothrombin Time 13.2 11.4 - 15.2 seconds   INR 1.0 0.8 - 1.2    Comment: (NOTE) INR goal varies based on device and disease states. Performed at [facility], [place] [address] [place], [place] [address]   APTT     Status: None   Collection Time: 12/23/18  6:15 PM  Result Value Ref Range   aPTT 34 24 - 36 seconds    Comment: Performed at [facility], [place] [address] [place], [place] [address]  CBC     Status: Abnormal   Collection Time: 12/23/18  6:15 PM  Result Value Ref Range   WBC 10.3 4.0 - 10.5 K/uL   RBC 5.55 (H) 3.87 - 5.11 MIL/uL   Hemoglobin 12.6 12.0 - 15.0 g/dL   HCT 42.9 36.0 - 46.0 %   MCV 77.3 (L) 80.0 - 100.0 fL   MCH 22.7 (L) 26.0 - 34.0 pg   MCHC 29.4 (L) 30.0 - 36.0 g/dL   RDW 16.9 (H) 11.5 - 15.5 %   Platelets 367 150 - 400 K/uL   nRBC 0.0 0.0 - 0.2 %    Comment: Performed at [facility], [place] [address], [place], [place] [address]   Differential     Status: Abnormal   Collection Time: 12/23/18  6:15 PM  Result Value Ref Range   Neutrophils Relative % 52 %   Neutro Abs 5.4 1.7 - 7.7 K/uL   Lymphocytes Relative 23 %   Lymphs Abs 2.4 0.7 - 4.0 K/uL   Monocytes Relative 7 %   Monocytes Absolute 0.7 0.1 - 1.0 K/uL   Eosinophils Relative 17 %   Eosinophils Absolute 1.8 (H) 0.0 - 0.5 K/uL  Basophils Relative 0 %   Basophils Absolute 0.0 0.0 - 0.1 K/uL   Immature Granulocytes 1 %   Abs Immature Granulocytes 0.05 0.00 - 0.07 K/uL    Comment: Performed at [facility], [place] [address], [place], [place] [address]  Comprehensive metabolic panel     Status: Abnormal   Collection Time: 12/23/18  6:15 PM  Result Value Ref Range   Sodium 135 135 - 145 mmol/L   Potassium 3.7 3.5 - 5.1 mmol/L   Chloride 102 98 - 111 mmol/L   CO2 23 22 - 32 mmol/L   Glucose, Bld 142 (H) 70 - 99 mg/dL   BUN 10 8 - 23 mg/dL   Creatinine, Ser 0.71 0.44 - 1.00 mg/dL   Calcium 10.6 (H) 8.9 - 10.3 mg/dL   Total Protein 8.9 (H) 6.5 - 8.1 g/dL   Albumin 4.2 3.5 - 5.0 g/dL   AST 23 15 - 41 U/L   ALT 32 0 - 44 U/L   Alkaline Phosphatase 81 38 - 126 U/L   Total Bilirubin 0.5 0.3 - 1.2 mg/dL   GFR calc non Af Amer >60 >60 mL/min   GFR calc Af Amer >60 >60 mL/min   Anion gap 10 5 - 15    Comment: Performed at [facility], [place] [address], [place], [place] [address]  SARS Coronavirus 2 [facility] order, Performed in [facility] hospital lab) Nasopharyngeal Nasopharyngeal Swab     Status: None   Collection Time: 12/23/18  6:16 PM   Specimen: Nasopharyngeal Swab  Result Value Ref Range   SARS Coronavirus 2 NEGATIVE NEGATIVE    Comment: (NOTE) If result is NEGATIVE SARS-CoV-2 target nucleic acids are NOT DETECTED. The SARS-CoV-2 RNA is generally detectable in upper and lower  respiratory specimens during the acute phase of infection. The lowest  concentration of SARS-CoV-2 viral copies this assay can detect is  250  copies / mL. A negative result does not preclude SARS-CoV-2 infection  and should not be used as the sole basis for treatment or other  patient management decisions.  A negative result may occur with  improper specimen collection / handling, submission of specimen other  than nasopharyngeal swab, presence of viral mutation(s) within the  areas targeted by this assay, and inadequate number of viral copies  (<250 copies / mL). A negative result must be combined with clinical  observations, patient history, and epidemiological information. If result is POSITIVE SARS-CoV-2 target nucleic acids are DETECTED. The SARS-CoV-2 RNA is generally detectable in upper and lower  respiratory specimens dur ing the acute phase of infection.  Positive  results are indicative of active infection with SARS-CoV-2.  Clinical  correlation with patient history and other diagnostic information is  necessary to determine patient infection status.  Positive results do  not rule out bacterial infection or co-infection with other viruses. If result is PRESUMPTIVE POSTIVE SARS-CoV-2 nucleic acids MAY BE PRESENT.   A presumptive positive result was obtained on the submitted specimen  and confirmed on repeat testing.  While 2019 novel coronavirus  (SARS-CoV-2) nucleic acids may be present in the submitted sample  additional confirmatory testing may be necessary for epidemiological  and / or clinical management purposes  to differentiate between  SARS-CoV-2 and other Sarbecovirus currently known to infect humans.  If clinically indicated additional testing with an alternate test  methodology [phone]) is advised. The SARS-CoV-2 RNA is generally  detectable in upper and lower respiratory sp ecimens during the acute  phase of infection. The expected result is Negative. Fact Sheet for Patients:  StrictlyIdeas.no Fact Sheet for Healthcare  Providers: BankingDealers.co.za This test is not yet approved or cleared by the [place] FDA and has been authorized for detection and/or diagnosis of SARS-CoV-2 by FDA under an Emergency Use Authorization (EUA).  This EUA will remain in effect (meaning this test can be used) for the duration of the COVID-19 declaration under Section 564(b)(1) of the Act, 21 U.S.C. section 360bbb-3(b)(1), unless the authorization is terminated or revoked sooner. Performed at [facility], [place] [address], [place], [place] [address]   I-stat chem 8, ED     Status: Abnormal   Collection Time: 12/23/18  6:29 PM  Result Value Ref Range   Sodium 137 135 - 145 mmol/L   Potassium 3.8 3.5 - 5.1 mmol/L   Chloride 102 98 - 111 mmol/L   BUN 9 8 - 23 mg/dL   Creatinine, Ser 0.70 0.44 - 1.00 mg/dL   Glucose, Bld 138 (H) 70 - 99 mg/dL   Calcium, Ion 1.41 (H) 1.15 - 1.40 mmol/L   TCO2 26 22 - 32 mmol/L   Hemoglobin 15.0 12.0 - 15.0 g/dL   HCT 44.0 36.0 - 46.0 %  Urine rapid drug screen (hosp performed)     Status: None   Collection Time: 12/23/18  6:41 PM  Result Value Ref Range   Opiates NONE DETECTED NONE DETECTED   Cocaine NONE DETECTED NONE DETECTED   Benzodiazepines NONE DETECTED NONE DETECTED   Amphetamines NONE DETECTED NONE DETECTED   Tetrahydrocannabinol NONE DETECTED NONE DETECTED   Barbiturates NONE DETECTED NONE DETECTED    Comment: (NOTE) DRUG SCREEN FOR MEDICAL PURPOSES ONLY.  IF CONFIRMATION IS NEEDED FOR ANY PURPOSE, NOTIFY LAB WITHIN 5 DAYS. LOWEST DETECTABLE LIMITS FOR URINE DRUG SCREEN Drug Class                     Cutoff (ng/mL) Amphetamine and metabolites    1000 Barbiturate and metabolites    200 Benzodiazepine                 [id number] Tricyclics and metabolites     300 Opiates and metabolites        300 Cocaine and metabolites        300 THC                            50 Performed at [facility], [place] [address] [place], [place] [address]   Urinalysis, Routine w reflex microscopic     Status: Abnormal   Collection Time: 12/23/18  6:41 PM  Result Value Ref Range   Color, Urine YELLOW YELLOW   APPearance HAZY (A) CLEAR   Specific Gravity, Urine 1.005 1.005 - 1.030   pH 7.0 5.0 - 8.0   Glucose, UA NEGATIVE NEGATIVE mg/dL   Hgb urine dipstick SMALL (A) NEGATIVE   Bilirubin Urine NEGATIVE NEGATIVE   Ketones, ur NEGATIVE NEGATIVE mg/dL   Protein, ur NEGATIVE NEGATIVE mg/dL   Nitrite POSITIVE (A) NEGATIVE   Leukocytes,Ua LARGE (A) NEGATIVE   RBC / HPF 0-5 0 - 5 RBC/hpf   WBC, UA >50 (H) 0 - 5 WBC/hpf   Bacteria, UA MANY (A) NONE SEEN   Squamous Epithelial / LPF 0-5 0 - 5   Mucus PRESENT     Comment: Performed at [facility], [place] [address] [place],  [place] [address]  Lithium level     Status: None   Collection Time: 12/23/18  6:41 PM  Result Value Ref Range   Lithium Lvl 1.13 0.60 - 1.20 mmol/L    Comment: Performed at [facility], [place] [address], [place], [place] [address]  Culture, Urine     Status: None   Collection Time: 12/23/18  7:53 PM   Specimen: Urine, Clean Catch  Result Value Ref Range   Specimen Description      URINE, CLEAN CATCH Performed at [facility], [place] [address] [place], [place] [address]    Special Requests      NONE Performed at [facility], [place] [address] [place], [place] [address]    Culture      NO GROWTH Performed at [facility], [place] [address] [place], [place] [address]    Report Status 12/25/2018 FINAL   CBC     Status: Abnormal   Collection Time: 12/24/18  8:09 AM  Result Value Ref Range   WBC 9.7 4.0 - 10.5 K/uL   RBC 4.88 3.87 - 5.11 MIL/uL   Hemoglobin 11.5 (L) 12.0 - 15.0 g/dL   HCT 38.3 36.0 - 46.0 %   MCV 78.5 (L) 80.0 - 100.0 fL   MCH 23.6 (L) 26.0 - 34.0 pg   MCHC 30.0 30.0 - 36.0 g/dL   RDW 16.5 (H) 11.5 - 15.5 %   Platelets 310 150 - 400 K/uL    nRBC 0.0 0.0 - 0.2 %    Comment: Performed at [facility], [place] [address], [place], [place] [address]  Comprehensive metabolic panel     Status: Abnormal   Collection Time: 12/24/18  8:09 AM  Result Value Ref Range   Sodium 136 135 - 145 mmol/L   Potassium 4.1 3.5 - 5.1 mmol/L   Chloride 107 98 - 111 mmol/L   CO2 23 22 - 32 mmol/L   Glucose, Bld 105 (H) 70 - 99 mg/dL   BUN 7 (L) 8 - 23 mg/dL   Creatinine, Ser 0.74 0.44 - 1.00 mg/dL   Calcium 9.8 8.9 - 10.3 mg/dL   Total Protein 7.3 6.5 - 8.1 g/dL   Albumin 3.2 (L) 3.5 - 5.0 g/dL   AST 21 15 - 41 U/L   ALT 27 0 - 44 U/L   Alkaline Phosphatase 69 38 - 126 U/L   Total Bilirubin 0.7 0.3 - 1.2 mg/dL   GFR calc non Af Amer >60 >60 mL/min   GFR calc Af Amer >60 >60 mL/min   Anion gap 6 5 - 15    Comment: Performed at [facility], [place] [address], [place], [place] [address]    Medications:  Current Facility-Administered Medications  Medication Dose Route Frequency Provider Last Rate Last Dose  . acetaminophen (TYLENOL) tablet 650 mg  650 mg Oral Q6H PRN Shela Leff, MD   650 mg at 12/24/18 [id number]   Or  . acetaminophen (TYLENOL) suppository 650 mg  650 mg Rectal Q6H PRN Shela Leff, MD      . albuterol (PROVENTIL) (2.5 MG/3ML) 0.083% nebulizer solution 2.5 mg  2.5 mg Nebulization Q4H PRN Shela Leff, MD      . cefTRIAXone (ROCEPHIN) 1 g in sodium chloride 0.9 % 100 mL IVPB  1 g Intravenous Q24H Shela Leff, MD   Stopped at 12/24/18 2050  . enoxaparin (LOVENOX) injection 40 mg  40 mg Subcutaneous Q24H Shela Leff, MD  40 mg at 12/24/18 2221  . hydrALAZINE (APRESOLINE) injection 5 mg  5 mg Intravenous Q4H PRN Shela Leff, MD      . lithium carbonate (ESKALITH) CR tablet 450 mg  450 mg Oral BID Satin Boal, MD      . nicotine (NICODERM CQ - dosed in mg/24 hours) patch 14 mg  14 mg Transdermal QHS Shela Leff, MD   14 mg at 12/24/18 2223  . OLANZapine  (ZYPREXA) tablet 20 mg  20 mg Oral QHS Marks Scalera, MD      . Minus Liberty Tosylate CAPS 1 tablet  1 tablet Oral QHS Corena Pilgrim, MD        Musculoskeletal: Strength & Muscle Tone: not tested Gait & Station: not tested Patient leans: N/A  Psychiatric Specialty Exam: Physical Exam  Psychiatric: She has a normal mood and affect. Her behavior is normal. Judgment and thought content normal. Her speech is delayed. Cognition and memory are impaired.    Review of Systems  Psychiatric/Behavioral: Negative.     Blood pressure 114/68, pulse (!) 54, temperature 97.8 F (36.6 C), temperature source Oral, resp. rate 17, height 5\' 5"  (1.651 m), weight 63 kg, SpO2 99 %.Body mass index is 23.11 kg/m.  General Appearance: Casual  Eye Contact:  Good  Speech:  Garbled and Pressured  Volume:  Decreased  Mood:  Dysphoric  Affect:  Constricted  Thought Process:  Disorganized  Orientation:  Other:  only to place and person, not to time  Thought Content:  Logical  Suicidal Thoughts:  No  Homicidal Thoughts:  No  Memory:  Immediate;   Fair Recent;   Poor Remote;   Fair  Judgement:  Fair  Insight:  Fair  Psychomotor Activity:  Psychomotor Retardation  Concentration:  Concentration: Fair and Attention Span: Fair  Recall:  [employer] of Knowledge:  Fair  Language:  Fair  Akathisia:  No  Handed:  Right  AIMS (if indicated):     Assets:  Communication Skills Desire for Improvement  ADL's:  marginal  Cognition:  Impaired,  Mild and Moderate  Sleep:   fair     Treatment Plan Summary: 63 year old female who was admitted due to altered mental status and found to have UTI. Patient has recurrent UTI since July 14th, 2020 and mental status changes most likely secondary to UTI and anti-psychotic medications. However, due  to dementia, psychiatric problem, recurrent altered mental status, a score of 20/30 on mini mental status and my evaluation today, patient does not have capacity to make  informed decision. But she is willing to be placed in a facility.  Recommendations: -Consider social work consult to facilitate placement. -Decrease Zyprexa to 20 mg po at bedtime for schizophrenia. -Decrease Lithobid CR to 300 mg po Bid due to confusion. -Continue Ingrezza 80 mg at bedtime for EPS prevention  Disposition: No evidence of imminent risk to self or others at present.   Patient does not meet criteria for psychiatric inpatient admission. Supportive therapy provided about ongoing stressors. psychiatric service siging out. Re-consult as needed  This service was provided via telemedicine using a 2-way, interactive audio and video technology.  Names of all persons participating in this telemedicine service and their role in this encounter. Name: Karen Best Role: Patient  Name: Amy Abernathy Role: RN  Name: Lorel Monaco Role: Psychiatris  Name:  Role:     Corena Pilgrim, MD 12/25/2018 2:11 PM

## 2018-12-25 NOTE — Progress Notes (Signed)
Patient arrived to 5E, A X O X 3 no complains of pain. Patient oriented to room and staff. Vitals stable,hypertention noted. Will continue to monitor.   Blood pressure (!) 168/88, pulse 63, temperature 99 F (37.2 C), temperature source Oral, resp. rate 17, height 5\' 5"  (1.651 m), weight 63 kg, SpO2 99 %.

## 2018-12-25 NOTE — Progress Notes (Signed)
  PROGRESS NOTE  Karen Best [id number] DOB: 13-Aug-1955 DOA: 12/23/2018 PCP: [facility]  Brief History   63 bipolar, shcizophre, asthma, hepatitis C treated Mavyret Underlying chronic dementia recent admission 8/10 through 8/20-at that admission psychiatry saw the patient---Felt she should continue her psychotropics and felt at that admission she had a UTI causing altered mental status-her home meds are Zyprexa 30 lithium 450 Ingrezza 80  Admission details are somewhat sparse-I called [name] the patient's psych nurse and got the report that patient has been intermittently leaving on the stove, sleeping with the door open and sleeping naked in the front of the house and has had significant odd behaviors  She also was found on 8/27 to have drooling unresponsiveness and lethargy-unclear precipitating of the event-[name] tells me she brought the patient to the hospital--her usual mental status that patient is coherent and able to discuss things   A & P   toxic metabolic encephalopathy Seems to have resolved-MRI still pending-continue Rocephin but discontinue if cultures are negative Have asked psychiatry to see the patient in consult regarding capacity as in the past she has refused placement and she is exhibiting unsafe/dangerous behaviors Nursing, nurse tech to try to ambulate patient-she might refuse  ?  Cocaine, EtOH Urine tox negative but does have a history of the same  Schizophrenia Await psych eval continuing Zyprexa 30 lithium 450 Ingrezza 80 seems to be stable at this time     DVT prophylaxis: Lovenox Code Status: Full code presumed Family Communication: No discussions today Disposition Plan: Inpatient pending resolution   [name], MD [facility] 9:29 AM  12/25/2018, 9:29 AM  LOS: 1 day   Consultants  . Psychiatry  Procedures  . Awaiting MRI  Antibiotics  . Rocephin  Interval History/Subjective  "I want to go home"  seems to have excessive burden of needs per nursing staff  Objective   Vitals:  Vitals:   12/24/18 2056 12/25/18 0531  BP: (!) 148/72 114/68  Pulse: (!) 59 (!) 54  Resp: 16 17  Temp: 98 F (36.7 C) 97.8 F (36.6 C)  SpO2: 100% 99%    Exam:  Chest clear no added sound Abdomen soft Stands comfortably Range of motion intact  I have personally reviewed the following:   Today's Data  . Urine culture pending  Lab Data    Scheduled Meds: . enoxaparin (LOVENOX) injection  40 mg Subcutaneous Q24H  . nicotine  14 mg Transdermal QHS   Continuous Infusions: . cefTRIAXone (ROCEPHIN)  IV Stopped (12/24/18 2050)    Principal Problem:   UTI (urinary tract infection) Active Problems:   Acute metabolic encephalopathy   Dysarthria   Elevated blood pressure reading   Asthma   Toxic metabolic encephalopathy   LOS: 1 day   How to contact the [facility] Attending or Consulting provider [place] or covering provider during after hours [place], for this patient?  1. Check the care team in [facility] and look for a) attending/consulting [facility] provider listed and b) the [facility] team listed 2. Log into [email or web address] and use [place]'s universal password to access. If you do not have the password, please contact the hospital operator. 3. Locate the [facility] provider you are looking for under [facility] and page to a number that you can be directly reached. 4. If you still have difficulty reaching the provider, please page the [facility] (Director on Call) for the Hospitalists listed on amion for assistance.

## 2018-12-26 MED ORDER — TRAZODONE HCL 50 MG PO TABS
50.0000 mg | ORAL_TABLET | Freq: Once | ORAL | Status: AC
  Administered 2018-12-26: 50 mg via ORAL
  Filled 2018-12-26: qty 1

## 2018-12-26 NOTE — Progress Notes (Signed)
PROGRESS NOTE  Karen Best [id number] DOB: 06/18/1955 DOA: 12/23/2018 PCP: [facility]  Brief History   63 bipolar, shcizophre, asthma, hepatitis C treated Mavyret Underlying chronic dementia recent admission 8/10 through 8/20-at that admission psychiatry saw the patient---Felt she should continue her psychotropics and felt at that admission she had a UTI causing altered mental status-her home meds are Zyprexa 30 lithium 450 Ingrezza 80  Admission details are somewhat sparse-I called [name] the patient's psych nurse and got the report that patient has been intermittently leaving on the stove, sleeping with the door open and sleeping naked in the front of the house and has had significant odd behaviors  She also was found on 8/27 to have drooling unresponsiveness and lethargy-unclear precipitating of the event-[name] tells me she brought the patient to the hospital--her usual mental status that patient is coherent and able to discuss things   A & P   toxic metabolic encephalopathy MRI brain finally accomplished 8/29--no stroke Rocephin discontinued as urine culture 8/27 not suggestive of infection More likely than not her confusion was from her underlying schizophrenia see below 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  Cocaine, EtOH Urine tox negative but does have a history of the same Schizophrenia Psychiatry saw the patient 8/29 and changed meds as below-can use trazodone at night    DVT prophylaxis: Lovenox Code Status: Full code presumed Family Communication: No discussions today Disposition Plan: Will need skilled care does not have capacity social Development worker, community to look in on patient   [name], MD [facility] 9:05 AM  12/26/2018, 9:05 AM  LOS: 2 days   Consultants  . Psychiatry  Procedures  . Awaiting MRI  Antibiotics  . Rocephin  Interval  History/Subjective   No new issues mild low-grade temp this morning otherwise fine Did not sleep at all last night no chest pain no fever Asking when she can go to rehab  Objective   Vitals:  Vitals:   12/26/18 0557 12/26/18 0853  BP: 96/66 126/77  Pulse: (!) 54 61  Resp: 18   Temp: 99.6 F (37.6 C) 97.7 F (36.5 C)  SpO2: 97% 99%    Exam:  Awake coherent no distress smiling EOMI NCAT Chest clear Abdomen soft  I have personally reviewed the following:   Today's Data  . Urine culture negative for infection 8/27 . MRI brain shows eustachian tube cysts which will need to be followed  Lab Data    Scheduled Meds: . enoxaparin (LOVENOX) injection  40 mg Subcutaneous Q24H  . lithium carbonate  300 mg Oral Q12H  . nicotine  14 mg Transdermal QHS  . OLANZapine  20 mg Oral QHS  . Valbenazine Tosylate  1 tablet Oral QHS   Continuous Infusions: . cefTRIAXone (ROCEPHIN)  IV 1 g (12/25/18 2137)    Principal Problem:   UTI (urinary tract infection) Active Problems:   Acute metabolic encephalopathy   Dysarthria   Elevated blood pressure reading   Asthma   Toxic metabolic encephalopathy   LOS: 2 days   How to contact the [facility] Attending or Consulting provider [place] or covering provider during after hours [place], for this patient?  1. Check the care team in [facility] and look for a) attending/consulting TRH provider listed and b) the [facility] team listed 2. Log into [email or web address] and use [place]'s universal password to access. If you do not have the  password, please contact the hospital operator. 3. Locate the [facility] provider you are looking for under [facility] and page to a number that you can be directly reached. 4. If you still have difficulty reaching the provider, please page the [facility] (Director on Call) for the Hospitalists listed on amion for assistance.

## 2018-12-26 NOTE — NC FL2 (Signed)
Karen Best LEVEL OF CARE SCREENING TOOL     IDENTIFICATION  Patient Name: Karen Best Birthdate: March 08, 1956 Sex: female Admission Date (Current Location): 12/23/2018  Karen Best Number:  Karen Best [id number] [name] and Address:  Karen Best,  Karen Best Karen Best      Provider Number: [id number]  Attending Physician Name and Address:  Nita Sells, MD  Relative Name and Phone Number:       Current Level of Care: Hospital Recommended Level of Care: [place] Prior Approval Number:    Date Approved/Denied: 12/17/18 PASRR Number: [id number] F expires 03/17/2019  Discharge Plan: SNF    Current Diagnoses: Patient Active Problem List   Diagnosis Date Noted  . Toxic metabolic encephalopathy Q000111Q  . Dysarthria 12/23/2018  . Elevated blood pressure reading 12/23/2018  . Asthma 12/23/2018  . Delirium due to another medical condition   . UTI (urinary tract infection) 12/06/2018  . Acute metabolic encephalopathy [id number]  . 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 Psychosis ([place]) 09/16/2013  . Schizophrenia ([place])     Orientation RESPIRATION BLADDER Height & Weight     Self, Place  Normal Incontinent, External catheter Weight: 138 lb 14.2 oz (63 kg) Height:  5\' 5"  (165.1 cm)  BEHAVIORAL SYMPTOMS/MOOD NEUROLOGICAL BOWEL NUTRITION STATUS      Continent Diet(heart healthy)  AMBULATORY STATUS COMMUNICATION OF NEEDS Skin   Limited Assist Verbally Normal                       Personal Care Assistance Level of Assistance  Bathing, Feeding, Dressing Bathing Assistance: Limited assistance Feeding assistance: Independent Dressing Assistance: Limited assistance     Functional Limitations Info    Sight Info: Adequate Hearing Info: Adequate Speech Info: Adequate    SPECIAL CARE FACTORS FREQUENCY  PT (By licensed PT), OT (By licensed OT), Speech therapy     PT Frequency:  5x OT Frequency: 5x     Speech Therapy Frequency: 1x      Contractures Contractures Info: Not present    Additional Factors Info  Code Status, Allergies, Psychotropic Code Status Info: full code Allergies Info: penicillin Psychotropic Info: see med list         Current Medications (12/26/2018):  This is the current hospital active medication list Current Facility-Administered Medications  Medication Dose Route Frequency Provider Last Rate Last Dose  . acetaminophen (TYLENOL) tablet 650 mg  650 mg Oral Q6H PRN Shela Leff, MD   650 mg at 12/25/18 2018   Or  . acetaminophen (TYLENOL) suppository 650 mg  650 mg Rectal Q6H PRN Shela Leff, MD      . albuterol (PROVENTIL) (2.5 MG/3ML) 0.083% nebulizer solution 2.5 mg  2.5 mg Nebulization Q4H PRN Shela Leff, MD      . enoxaparin (LOVENOX) injection 40 mg  40 mg Subcutaneous Q24H Shela Leff, MD   40 mg at 12/25/18 2134  . hydrALAZINE (APRESOLINE) injection 5 mg  5 mg Intravenous Q4H PRN Shela Leff, MD      . lithium carbonate (LITHOBID) CR tablet 300 mg  300 mg Oral Q12H Akintayo, Mojeed, MD   300 mg at 12/26/18 0954  . nicotine (NICODERM CQ - dosed in mg/24 hours) patch 14 mg  14 mg Transdermal QHS Shela Leff, MD   14 mg at 12/25/18 2135  . OLANZapine (ZYPREXA) tablet 20 mg  20 mg Oral QHS Akintayo, Mojeed, MD   20 mg at 12/25/18  2134  [place] Tosylate CAPS 1 tablet  1 tablet Oral QHS Corena Pilgrim, MD         Discharge Medications: Please see discharge summary for a list of discharge medications.  Relevant Imaging Results:  Relevant Lab Results:   Additional Information SS#: [id number]  Nila Nephew, LCSW

## 2018-12-26 NOTE — Progress Notes (Signed)
Pt up to [facility]. Still weak. Have encouraged her to ambulate, which she says she does at home without equipment. Pt refusing to get up this day, stating that she is "scared and will walk when she gets to go home".

## 2018-12-26 NOTE — TOC Initial Note (Signed)
Transition of Care [facility]) - Initial/Assessment Note    Patient Details  Name: NOON SIPP MRN: [id number] Date of Birth: 13-May-1955  Transition of Care [facility]) CM/SW Contact:    Nila Nephew, LCSW Phone Number: 12/26/2018, 2:56 PM  Clinical Narrative: Pt admitted with toxic metabolic encephalopathy from home where she resides alone in apartment. Has schizophrenia and frequent ED visits with psychiatric and medical issues. Has ACTT services through [facility]. Yesterday psychiatry consult advised pt does not currently show good capacity to understand decisions related to disposition.  SNF has been recommended, discussed with pt today. She is agreeable. States she does not have family to help with decisions but "you can talk to my ACTT team" ([name], number below). Pt reports she gets disability income of $1,064/month. Has [employer]. Reports her monthly apartment rent is $415. Pt is agreeable to signing over check to a SNF on a short term basis but does not want to lose her apartment. Unable to meaningfully discuss options- CSW will discuss with her contacts.  Chart review indicates SNF was pursued during recent hospitalization at [facility], no bed offers were made at that time and pt functional status improved and she [place] home. Long history of bizarre behavior in community and fluctuating degree of safety at home ("leaving door open, leaving oven on"). Completed FL2 and referrals and will contact ACTT/[name] to further discuss details and barriers.                    Expected Discharge Plan: (pursuing SNF) Barriers to Discharge: Inadequate or no insurance, Continued Medical Work up, SNF Pending bed offer   Patient Goals and CMS Choice Patient states their goals for this hospitalization and ongoing recovery are:: "do whatever I need to do"      Expected Discharge Plan and Services Expected Discharge Plan: (pursuing SNF) In-house Referral: Clinical Social Work   Post Acute  Care Choice: [place] Living arrangements for the past 2 months: Apartment                                      Prior Living Arrangements/Services Living arrangements for the past 2 months: Apartment Lives with:: Self Patient language and need for interpreter reviewed:: No Do you feel safe going back to the place where you live?: Yes      Need for Family Participation in Patient Care: Yes (Comment)(currently poor capacity to understand/make disposition decisions) Care giver support system in place?: No (comment)(pt lives alone)   Criminal Activity/Legal Involvement Pertinent to Current Situation/Hospitalization: No - Comment as needed  Activities of Daily Living Home Assistive Devices/Equipment: None ADL Screening (condition at time of admission) Patient's cognitive ability adequate to safely complete daily activities?: Yes Is the patient deaf or have difficulty hearing?: No Does the patient have difficulty seeing, even when wearing glasses/contacts?: No Does the patient have difficulty concentrating, remembering, or making decisions?: Yes Patient able to express need for assistance with ADLs?: Yes Does the patient have difficulty dressing or bathing?: Yes Independently performs ADLs?: No Communication: Independent Dressing (OT): Needs assistance Is this a change from baseline?: Change from baseline, expected to last >3 days Grooming: Needs assistance Is this a change from baseline?: Change from baseline, expected to last >3 days Feeding: Independent Bathing: Needs assistance Is this a change from baseline?: Change from baseline, expected to last >3 days Toileting: Needs assistance Is this  a change from baseline?: Change from baseline, expected to last >3days In/Out Bed: Needs assistance Is this a change from baseline?: Change from baseline, expected to last >3 days Walks in Home: Independent with device (comment) Is this a change from baseline?:  Pre-admission baseline Does the patient have difficulty walking or climbing stairs?: Yes Weakness of Legs: Both Weakness of Arms/Hands: None  Permission Sought/Granted Permission sought to share information with : Other (comment)       Permission granted to share info w AGENCY: [phone] [facility]        Emotional Assessment Appearance:: Appears stated age Attitude/Demeanor/Rapport: Engaged Affect (typically observed): Accepting, Calm Orientation: : Oriented to Self, Oriented to Place   Psych Involvement: Yes (comment)(pt has schizophrenia, has ACTT services)  Admission diagnosis:  Encephalopathy [G93.40] Acute cystitis without hematuria [N30.00] Patient Active Problem List   Diagnosis Date Noted  . Toxic metabolic encephalopathy Q000111Q  . Dysarthria 12/23/2018  . Elevated blood pressure reading 12/23/2018  . Asthma 12/23/2018  . Delirium due to another medical condition   . UTI (urinary tract infection) 12/06/2018  . Acute metabolic encephalopathy 123XX123  . 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 Psychosis ([place]) 09/16/2013  . Schizophrenia [facility])    PCP:  Inc, [facility] Pharmacy:   [place], [place] [address] [place] [address] Phone: [phone] Fax: [phone]  [place], [place] [address] [place] [address] Phone: [phone] Fax: [phone]     Social Determinants of Health ([place]) Interventions    Readmission Risk Interventions No flowsheet data found.

## 2018-12-27 ENCOUNTER — Telehealth: Payer: Self-pay | Admitting: *Deleted

## 2018-12-27 NOTE — Plan of Care (Signed)
Pt understands that she needs to go to "some place for rehab" but she feels that she needs to stay for 30 days and then she will be able to return home.  Pt Does not realize safety risks that are present for pt to live alone.  Pt says she does not want to walk and get out of bed today because she is "afraid of heights".  RN will continue to provide education and explain importance for pt to be mobile and walk as much as possible.

## 2018-12-27 NOTE — Progress Notes (Signed)
PROGRESS NOTE  Karen Best [id number] DOB: July 14, 1955 DOA: 12/23/2018 PCP: [facility]  Brief History   63 bipolar, shcizophre, asthma, hepatitis C treated Mavyret Underlying chronic dementia recent admission 8/10 through 8/20-at that admission psychiatry saw the patient---Felt she should continue her psychotropics and felt at that admission she had a UTI causing altered mental status-her home meds are Zyprexa 30 lithium 450 Ingrezza 80  Admission details are somewhat sparse-I called Karen Best the patient's psych nurse and got the report that patient has been intermittently leaving on the stove, sleeping with the door open and sleeping naked in the front of the house and has had significant odd behaviors  She also was found on 8/27 to have drooling unresponsiveness and lethargy-unclear precipitating of the event-Karen Best tells me she brought the patient to the hospital--her usual mental status that patient is coherent and able to discuss things   A & P   toxic metabolic encephalopathy MRI brain finally accomplished 8/29--no stroke Rocephin discontinued as urine culture 8/27 not suggestive of infection Confusion likely related to schizophrenia as per below 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  Cocaine, EtOH Urine tox negative but does have a history of the same Schizophrenia Psychiatry saw the patient 8/29 med changes = lithium down to 300 twice daily, valbenazine 1 tablet at bedtime, Zyprexa 20 daily    DVT prophylaxis: Lovenox Code Status: Full code presumed Family Communication: No discussions today Disposition Plan: Will need skilled care does not have capacity social Development worker, community to look in on patient   [name], MD [facility] 1:31 PM  12/27/2018, 1:31 PM  LOS: 3 days   Consultants  . Psychiatry  Procedures  . MRI negative for stroke  Antibiotics  .  Rocephin stopped on 8/29  Interval History/Subjective   Cooperative needed minimal assist to get to chair No other issues  Objective   Vitals:  Vitals:   12/27/18 1314 12/27/18 1315  BP: 121/73 121/73  Pulse: 63 61  Resp: 17 17  Temp: 98.5 F (36.9 C) 98.5 F (36.9 C)  SpO2: 100% 100%    Exam:  Awake coherent no distress smiling not agitated pleasant EOMI NCAT Chest clear Abdomen soft  I have personally reviewed the following:   Today's Data  . Urine culture negative for infection 8/27 . MRI brain shows eustachian tube cysts which will need to be followed  Lab Data    Scheduled Meds: . enoxaparin (LOVENOX) injection  40 mg Subcutaneous Q24H  . lithium carbonate  300 mg Oral Q12H  . nicotine  14 mg Transdermal QHS  . OLANZapine  20 mg Oral QHS  . Valbenazine Tosylate  1 tablet Oral QHS   Continuous Infusions:   Principal Problem:   UTI (urinary tract infection) Active Problems:   Acute metabolic encephalopathy   Dysarthria   Elevated blood pressure reading   Asthma   Toxic metabolic encephalopathy   LOS: 3 days   How to contact the [facility] Attending or Consulting provider [place] or covering provider during after hours [place], for this patient?  1. Check the care team in [facility] and look for a) attending/consulting TRH provider listed and b) the [facility] team listed 2. Log into [email or web address] and use [place]'s universal password to access. If you do not have the password, please contact the hospital operator. 3. Locate the [facility] provider you are looking for under [facility]  and page to a number that you can be directly reached. 4. If you still have difficulty reaching the provider, please page the [facility] (Director on Call) for the Hospitalists listed on amion for assistance.

## 2018-12-27 NOTE — TOC Progression Note (Signed)
Transition of Care [facility]) - Progression Note    Patient Details  Name: Karen Best MRN: [id number] Date of Birth: [birth year]  Transition of Care [facility]) CM/SW [place], [place] Phone Number: [phone] 12/27/2018, 4:08 PM  Clinical Narrative:   Discussed pt's disposition plans with pt's [place] ACTT member Karen Best [phone] (see previous notes, pt has been deemed to lack capacity to make disposition decisions alone and has requested her ACTT be deferred to as her family is not closely involved or local).  CSW shared pt's concern that if she signs check over to a SNF for the month (requirement when Medicaid coverage does not include facility care), that she will not have money for her apartment rent and bills. Pt is not planning to stay long term at a SNF, is hoping to return home following SNF rehab. ACTT validates this concern and, while mentioning that pt may qualify for a housing program following SNF (such as transitional housing though [facility]), feels support is needed to make decisions that would affect pt's housing and finances. Pt reports her daughter Karen Best (in [place]) can be contacted as well- [phone]. CSW attempting contact. Pt also mentions today that a "friend in [place]," [phone] has expressed to pt that pt could come stay with her at [place] for supervision and assistance for several weeks, and pt would prefer this over SNF if appropriate. CSW left voicemail to inquire more.    Expected Discharge Plan: (pursuing SNF) Barriers to Discharge: Inadequate or no insurance, Continued Medical Work up, SNF Pending bed offer  Expected Discharge Plan and Services Expected Discharge Plan: (pursuing SNF) In-house Referral: Clinical Social Work   Post Acute Care Choice: [place] Living arrangements for the past 2 months: Apartment                                       Social Determinants of Health ([place])  Interventions    Readmission Risk Interventions No flowsheet data found.

## 2018-12-27 NOTE — Telephone Encounter (Signed)
Noted the patient is in the hospital. I called [place], [place] nurse for patient and advised her I am canceling her appointment tomorrow. We can reschedule her when she is well. Karen Best verbalized understanding, appreciation.

## 2018-12-27 NOTE — Progress Notes (Signed)
Physical Therapy Treatment Patient Details Name: Karen Best MRN: [id number] DOB: December 19, 1955 Today's Date: 12/27/2018    History of Present Illness 63 y.o. female with medical history significant of asthma, bipolar 1 disorder, schizophrenia, fibromyalgia, hep C, tobacco abuse presenting to the hospital with a chief complaint of "I had a stroke."  Pt admitted for UTI and acute metabolic encephalopathy.  Recent admission a week ago for same.    PT Comments    Karen Best was received in bed, lethargic but willing to participate in skilled PT services but perseverating that she is scared of heights. Able to complete bed mobility with ModA and demonstrated significant R and posterior lean but able to correct with verbal and tactile cues. Performed stand-pivot transfer to chair with modA, patient refusing gait belt so required multiple small attempts for safety rather than performing larger transfer due to unsteadiness today. She was left up in the chair with all needs met, chair alarm active, and nursing staff aware of need for new purewick/bedding change this morning.     Follow Up Recommendations  SNF;Supervision/Assistance - 24 hour     Equipment Recommendations  Rolling walker with 5" wheels    Recommendations for Other Services       Precautions / Restrictions Precautions Precautions: Fall Precaution Comments: pt very fearful of falling Restrictions Weight Bearing Restrictions: No    Mobility  Bed Mobility Overal bed mobility: Needs Assistance Bed Mobility: Supine to Sit     Supine to sit: Mod assist     General bed mobility comments: ModA to complete full transfer to EOB, manual and verbal cues for upright posture and to reduce R lean  Transfers Overall transfer level: Needs assistance Equipment used: 2 person hand held assist Transfers: Sit to/from [facility] Sit to Stand: Mod assist Stand pivot transfers: Mod assist       General transfer  comment: ModA to complete transfer from bed to chair  Ambulation/Gait             General Gait Details: patient declined   Stairs             Wheelchair Mobility    Modified Rankin (Stroke Patients Only)       Balance Overall balance assessment: Needs assistance Sitting-balance support: Feet supported Sitting balance-Leahy Scale: Fair     Standing balance support: No upper extremity supported Standing balance-Leahy Scale: Poor                              Cognition Arousal/Alertness: Lethargic Behavior During Therapy: Flat affect Overall Cognitive Status: No family/caregiver present to determine baseline cognitive functioning Area of Impairment: Following commands;Safety/judgement;Problem solving                     Memory: Decreased short-term memory Following Commands: Follows one step commands with increased time Safety/Judgement: Decreased awareness of safety;Decreased awareness of deficits   Problem Solving: Slow processing;Requires verbal cues;Decreased initiation General Comments: pt not very expressive, required manual cues to initiate      Exercises      General Comments        Pertinent Vitals/Pain Pain Assessment: Faces Faces Pain Scale: No hurt Pain Intervention(s): Limited activity within patient's tolerance;Monitored during session    Home Living                      Prior Function  PT Goals (current goals can now be found in the care plan section) Acute Rehab PT Goals Patient Stated Goal: to go home and smoke a cigarette PT Goal Formulation: With patient Time For Goal Achievement: 01/06/19 Potential to Achieve Goals: Good Additional Goals Additional Goal #1: Pt will score >19 on DGI to ensure low fall risk. Progress towards PT goals: Not progressing toward goals - comment(participation limited by cognitive issues)    Frequency    Min 2X/week      PT Plan      Co-evaluation               AM-PAC PT "6 Clicks" Mobility   Outcome Measure  Help needed turning from your back to your side while in a flat bed without using bedrails?: A Little Help needed moving from lying on your back to sitting on the side of a flat bed without using bedrails?: A Lot Help needed moving to and from a bed to a chair (including a wheelchair)?: A Lot Help needed standing up from a chair using your arms (e.g., wheelchair or bedside chair)?: A Lot Help needed to walk in hospital room?: A Lot Help needed climbing 3-5 steps with a railing? : A Lot 6 Click Score: 13    End of Session   Activity Tolerance: Patient tolerated treatment well Patient left: in chair;with call bell/phone within reach;with chair alarm set Nurse Communication: Mobility status;Other (comment)(need for purewick replacement and bed change) PT Visit Diagnosis: Unsteadiness on feet (R26.81);Muscle weakness (generalized) (M62.81)     Time: 1102-1119 PT Time Calculation (min) (ACUTE ONLY): 17 min  Charges:  $Therapeutic Activity: 8-22 mins                       PT, DPT, CBIS  Supplemental Physical Therapist [place]    Pager [phone] Acute Rehab Office [phone]    

## 2018-12-28 ENCOUNTER — Ambulatory Visit: Payer: Medicaid Other | Admitting: Diagnostic Neuroimaging

## 2018-12-28 ENCOUNTER — Encounter

## 2018-12-28 NOTE — Progress Notes (Signed)
PROGRESS NOTE  Karen MOMAN [id number] DOB: [birth year] DOA: 12/23/2018 PCP: [facility]  Brief History   63 bipolar, shcizophre, asthma, hepatitis C treated Mavyret Underlying chronic dementia recent admission 8/10 through 8/20-at that admission psychiatry saw the patient---Felt she should continue her psychotropics and felt at that admission she had a UTI causing altered mental status-her home meds are Zyprexa 30 lithium 450 Ingrezza 80  Admission details are somewhat sparse-I called [name] the patient's psych nurse and got the report that patient has been intermittently leaving on the stove, sleeping with the door open and sleeping naked in the front of the house and has had significant odd behaviors  She also was found on 8/27 to have drooling unresponsiveness and lethargy-unclear precipitating of the event-[name] tells me she brought the patient to the hospital--her usual mental status that patient is coherent and able to discuss things  A & P   toxic metabolic encephalopathy MRI brain finally accomplished 8/29--no stroke Rocephin discontinued as urine culture 8/27 not suggestive of infection Confusion likely related to schizophrenia as per below-much improved and currently patient is stable at this time 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  Cocaine, EtOH Urine tox negative but does have a history of the same Schizophrenia Psychiatry saw the patient 8/29 med changes = lithium down to 300 twice daily, valbenazine 1 tablet at bedtime, Zyprexa 20 daily    DVT prophylaxis: Lovenox Code Status: Full code presumed Family Communication: Discussed with psych nurse [name] [phone] Disposition Plan: Await skilled facility placement as this is the safest disposition for the patient and patient is agreeable   [name], MD [facility] 2:45 PM  12/28/2018, 2:45 PM  LOS: 4 days    Consultants  . Psychiatry  Procedures  . MRI negative for stroke  Antibiotics  . Rocephin stopped on 8/29  Interval History/Subjective   Eating drinking no new issues   Objective   Vitals:  Vitals:   12/28/18 0523 12/28/18 1332  BP: 121/72 127/73  Pulse: (!) 59 66  Resp: 17 20  Temp: 98.5 F (36.9 C) 97.7 F (36.5 C)  SpO2: 100% 100%    Exam:  Awake coherent no distress smiling not agitated pleasant EOMI NCAT Chest clear Abdomen soft  I have personally reviewed the following:   Today's Data  . Urine culture negative for infection 8/27 . MRI brain shows eustachian tube cysts which will need to be followed  Lab Data    Scheduled Meds: . enoxaparin (LOVENOX) injection  40 mg Subcutaneous Q24H  . lithium carbonate  300 mg Oral Q12H  . nicotine  14 mg Transdermal QHS  . OLANZapine  20 mg Oral QHS  . Valbenazine Tosylate  1 tablet Oral QHS   Continuous Infusions:   Principal Problem:   UTI (urinary tract infection) Active Problems:   Acute metabolic encephalopathy   Dysarthria   Elevated blood pressure reading   Asthma   Toxic metabolic encephalopathy   LOS: 4 days   How to contact the [facility] Attending or Consulting provider [place] or covering provider during after hours [place], for this patient?  1. Check the care team in [facility] and look for a) attending/consulting TRH provider listed and b) the [facility] team listed 2. Log into [email or web address] and use [place]'s universal password to access. If you do not have the password, please contact the hospital operator. 3. Locate the  [place] provider you are looking for under [facility] and page to a number that you can be directly reached. 4. If you still have difficulty reaching the provider, please page the [facility] (Director on Call) for the Hospitalists listed on amion for assistance.

## 2018-12-28 NOTE — TOC Progression Note (Signed)
Transition of Care [facility]) - Progression Note    Patient Details  Name: LAQUETTA DELISE MRN: [id number] Date of Birth: Sep 24, 1955  Transition of Care [facility]) CM/SW [place], [place] Phone Number: [phone] 12/28/2018, 3:58 PM  Clinical Narrative:   Discussed pt's situation with both her daughter Levada Dy [phone] (in [place]) and local friend Pamala Hurry, [phone]. Both in agreement that if SNF admission is possible, they would encourage that and pt's daughter will reach out to patient's landlord to notify them of the situation in case she can help with rent for the month. Daughter states she hopes pt may agree to long term SNF placement going forward but is encouraged that she has agreed to short term for now. Pt has some SNF bed offers pending- CSW reached out to admissions offices today and is awaiting replies.     Expected Discharge Plan: (pursuing SNF) Barriers to Discharge: Inadequate or no insurance, Continued Medical Work up, SNF Pending bed offer  Expected Discharge Plan and Services Expected Discharge Plan: (pursuing SNF) In-house Referral: Clinical Social Work   Post Acute Care Choice: [place] Living arrangements for the past 2 months: Apartment                                       Social Determinants of Health (SDOH) Interventions    Readmission Risk Interventions No flowsheet data found.

## 2018-12-29 DIAGNOSIS — F209 Schizophrenia, unspecified: Principal | ICD-10-CM

## 2018-12-29 DIAGNOSIS — R410 Disorientation, unspecified: Secondary | ICD-10-CM

## 2018-12-29 LAB — BASIC METABOLIC PANEL
Anion gap: 6 (ref 5–15)
BUN: 10 mg/dL (ref 8–23)
CO2: 25 mmol/L (ref 22–32)
Calcium: 9.8 mg/dL (ref 8.9–10.3)
Chloride: 105 mmol/L (ref 98–111)
Creatinine, Ser: 0.74 mg/dL (ref 0.44–1.00)
GFR calc Af Amer: >60 mL/min (ref >60)
GFR calc non Af Amer: >60 mL/min (ref >60)
Glucose, Bld: 92 mg/dL (ref 70–99)
Potassium: 4 mmol/L (ref 3.5–5.1)
Sodium: 136 mmol/L (ref 135–145)

## 2018-12-29 LAB — CBC WITH DIFFERENTIAL/PLATELET
Abs Immature Granulocytes: 0.03 10*3/uL (ref 0.00–0.07)
Basophils Absolute: 0 10*3/uL (ref 0.0–0.1)
Basophils Relative: 0 %
Eosinophils Absolute: 2.1 10*3/uL — ABNORMAL HIGH (ref 0.0–0.5)
Eosinophils Relative: 26 %
HCT: 41.6 % (ref 36.0–46.0)
Hemoglobin: 12.3 g/dL (ref 12.0–15.0)
Immature Granulocytes: 0 %
Lymphocytes Relative: 33 %
Lymphs Abs: 2.6 10*3/uL (ref 0.7–4.0)
MCH: 23 pg — ABNORMAL LOW (ref 26.0–34.0)
MCHC: 29.6 g/dL — ABNORMAL LOW (ref 30.0–36.0)
MCV: 77.9 fL — ABNORMAL LOW (ref 80.0–100.0)
Monocytes Absolute: 0.7 10*3/uL (ref 0.1–1.0)
Monocytes Relative: 9 %
Neutro Abs: 2.6 10*3/uL (ref 1.7–7.7)
Neutrophils Relative %: 32 %
Platelets: 345 10*3/uL (ref 150–400)
RBC: 5.34 MIL/uL — ABNORMAL HIGH (ref 3.87–5.11)
RDW: 15.9 % — ABNORMAL HIGH (ref 11.5–15.5)
WBC: 8 10*3/uL (ref 4.0–10.5)
nRBC: 0 % (ref 0.0–0.2)

## 2018-12-29 LAB — PATHOLOGIST SMEAR REVIEW

## 2018-12-29 NOTE — TOC Progression Note (Addendum)
Transition of Care [facility]) - Progression Note    Patient Details  Name: Karen Best MRN: [id number] Date of Birth: 02-10-1956  Transition of Care [facility]) CM/SW [place], [place] Phone Number: [phone] 12/29/2018, 2:23 PM  Clinical Narrative:   Pt has accepted bed offer at [facility] SNF. Requires updated covid test within 72 hours of DC, awaiting result from test today in order to move forward with her admission to SNF (also dependent upon bed availability there, currently beds available but could fluctuate)  Updated pt's ACTT Jeanett Schlein [phone]) and left voicemail to update pt's daughter Levada Dy [phone].    Expected Discharge Plan:Skilled nursing facility Barriers to Discharge: COVID test result, SNF bed availability  Expected Discharge Plan and Services Expected Discharge Plan: (pursuing SNF) In-house Referral: Clinical Social Work   Post Acute Care Choice: [place] Living arrangements for the past 2 months: Apartment                                       Social Determinants of Health (SDOH) Interventions    Readmission Risk Interventions No flowsheet data found.

## 2018-12-29 NOTE — Progress Notes (Addendum)
[facility]  PROGRESS NOTE    Karen Best  [id number] DOB: [birth year] DOA: 12/23/2018 PCP: [facility]   Brief Narrative:   51 bipolar, shcizophre, asthma, hepatitis C treated Mavyret Underlying chronic dementia recent admission 8/10 through 8/20-at that admission psychiatry saw the patient---Felt she should continue her psychotropics and felt at that admission she had a UTI causing altered mental status-her home meds are Zyprexa 30 lithium 450 [address] 80  Admission details are somewhat sparse-I called [name] the patient's psych nurse and got the report that patient has been intermittently leaving on the stove, sleeping with the door open and sleeping naked in the front of the house and has had significant odd behaviors  She also was found on 8/27 to have drooling unresponsiveness and lethargy-unclear precipitating of the event-[name] tells me she brought the patient to the hospital--her usual mental status that patient is coherent and able to discuss things   Assessment & Plan:   Principal Problem:   UTI (urinary tract infection) Active Problems:   Dysarthria   Elevated blood pressure reading   Asthma   Acute confusion   Confusion     - MRI brain finally accomplished 8/29--no stroke     - Rocephin discontinued as urine culture 8/27 not suggestive of infection     - Confusion likely related to schizophrenia     - oriented today; eager to get to SNF; back to baseline?  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 Ordered. Looks good today. No changes.   DVT prophylaxis: Lovenox Code Status: FULL   Disposition Plan: To SNF  Consultants:   Psych   ROS:  Denies CP, N, V, dyspnea. Remainder 10-pt ROS is  negative for all not previously mentioned.  Subjective: "I can't wait to leave."  Objective: Vitals:   12/28/18 1332 12/28/18 2103 12/29/18 0537 12/29/18 1346  BP: 127/73 116/64 91/68 113/65  Pulse: 66 66 (!) 54 74  Resp: 20 19 18 18   Temp: 97.7 F (36.5 C) 97.6 F (36.4 C) 97.6 F (36.4 C) 98.2 F (36.8 C)  TempSrc: Oral Oral Oral Oral  SpO2: 100% 97% 96% 100%  Weight:      Height:        Intake/Output Summary (Last 24 hours) at 12/29/2018 1529 Last data filed at 12/29/2018 [id number] Gross per 24 hour  Intake 240 ml  Output 2300 ml  Net -2060 ml   Filed Weights   12/23/18 1816 12/23/18 2100  Weight: 63 kg 63 kg    Examination:  General: 63 y.o. female resting in bed in NAD Eyes: PERRL, normal sclera ENMT: Nares patent w/o discharge, orophaynx clear, dentition normal, ears w/o discharge/lesions/ulcers Cardiovascular: RRR, +S1, S2, no m/g/r, equal pulses throughout Respiratory: CTABL, no w/r/r, normal WOB GI: BS+, NDNT, no masses noted, no organomegaly noted MSK: No e/c/c Skin: No rashes, bruises, ulcerations noted Neuro: Alert to name, follows commands   Data Reviewed: I have personally reviewed following labs and imaging studies.  CBC: Recent Labs  Lab 12/23/18 1815 12/23/18 1829 12/24/18 0809 12/29/18 0546  WBC 10.3  --  9.7 8.0  NEUTROABS 5.4  --   --  2.6  HGB 12.6 15.0 11.5* 12.3  HCT 42.9 44.0 38.3 41.6  MCV 77.3*  --  78.5* 77.9*  PLT 367  --  310 [id number]   Basic Metabolic Panel: Recent Labs  Lab 12/23/18 1815 12/23/18 1829 12/24/18 0809 12/29/18 0546  NA 135 137 136 136  K 3.7 3.8 4.1 4.0  CL 102 102 107 105  CO2 23  --  23 25  GLUCOSE 142* 138* 105* 92  BUN 10 9 7* 10  CREATININE 0.71 0.70 0.74 0.74  CALCIUM 10.6*  --  9.8 9.8   GFR: Estimated Creatinine Clearance: 65.6 mL/min (by C-G formula based on SCr of 0.74 mg/dL). Liver Function Tests: Recent Labs  Lab 12/23/18 1815 12/24/18 0809  AST 23 21  ALT 32 27  ALKPHOS 81 69  BILITOT  0.5 0.7  PROT 8.9* 7.3  ALBUMIN 4.2 3.2*   No results for input(s): LIPASE, AMYLASE in the last 168 hours. No results for input(s): AMMONIA in the last 168 hours. Coagulation Profile: Recent Labs  Lab 12/23/18 1815  INR 1.0   Cardiac Enzymes: No results for input(s): CKTOTAL, CKMB, CKMBINDEX, TROPONINI in the last 168 hours. BNP (last 3 results) No results for input(s): PROBNP in the last 8760 hours. HbA1C: No results for input(s): HGBA1C in the last 72 hours. CBG: No results for input(s): GLUCAP in the last 168 hours. Lipid Profile: No results for input(s): CHOL, HDL, LDLCALC, TRIG, CHOLHDL, LDLDIRECT in the last 72 hours. Thyroid Function Tests: No results for input(s): TSH, T4TOTAL, FREET4, T3FREE, THYROIDAB in the last 72 hours. Anemia Panel: No results for input(s): VITAMINB12, FOLATE, FERRITIN, TIBC, IRON, RETICCTPCT in the last 72 hours. Sepsis Labs: No results for input(s): PROCALCITON, LATICACIDVEN in the last 168 hours.  Recent Results (from the past 240 hour(s))  SARS Coronavirus 2 [facility] order, Performed in [facility] hospital lab) Nasopharyngeal Nasopharyngeal Swab     Status: None   Collection Time: 12/23/18  6:16 PM   Specimen: Nasopharyngeal Swab  Result Value Ref Range Status   SARS Coronavirus 2 NEGATIVE NEGATIVE Final    Comment: (NOTE) If result is NEGATIVE SARS-CoV-2 target nucleic acids are NOT DETECTED. The SARS-CoV-2 RNA is generally detectable in upper and lower  respiratory specimens during the acute phase of infection. The lowest  concentration of SARS-CoV-2 viral copies this assay can detect is 250  copies / mL. A negative result does not preclude SARS-CoV-2 infection  and should not be used as the sole basis for treatment or other  patient management decisions.  A negative result may occur with  improper specimen collection / handling, submission of specimen other  than nasopharyngeal swab, presence of viral mutation(s) within the  areas  targeted by this assay, and inadequate number of viral copies  (<250 copies / mL). A negative result must be combined with clinical  observations, patient history, and epidemiological information. If result is POSITIVE SARS-CoV-2 target nucleic acids are DETECTED. The SARS-CoV-2 RNA is generally detectable in upper and lower  respiratory specimens dur ing the acute phase of infection.  Positive  results are indicative of active infection with SARS-CoV-2.  Clinical  correlation with patient history and other diagnostic information is  necessary to determine patient infection status.  Positive results do  not rule out bacterial infection or co-infection with other viruses. If result is PRESUMPTIVE POSTIVE SARS-CoV-2 nucleic acids MAY BE PRESENT.   A presumptive positive result was obtained on the submitted specimen  and confirmed on repeat testing.  While 2019 novel coronavirus  (SARS-CoV-2)  nucleic acids may be present in the submitted sample  additional confirmatory testing may be necessary for epidemiological  and / or clinical management purposes  to differentiate between  SARS-CoV-2 and other Sarbecovirus currently known to infect humans.  If clinically indicated additional testing with an alternate test  methodology [phone]) is advised. The SARS-CoV-2 RNA is generally  detectable in upper and lower respiratory sp ecimens during the acute  phase of infection. The expected result is Negative. Fact Sheet for Patients:  StrictlyIdeas.no Fact Sheet for Healthcare Providers: BankingDealers.co.za This test is not yet approved or cleared by the [place] FDA and has been authorized for detection and/or diagnosis of SARS-CoV-2 by FDA under an Emergency Use Authorization (EUA).  This EUA will remain in effect (meaning this test can be used) for the duration of the COVID-19 declaration under Section 564(b)(1) of the Act, 21 U.S.C. section  360bbb-3(b)(1), unless the authorization is terminated or revoked sooner. Performed at [facility], [place] [address], [place], [place] [address]   Culture, Urine     Status: None   Collection Time: 12/23/18  7:53 PM   Specimen: Urine, Clean Catch  Result Value Ref Range Status   Specimen Description   Final    URINE, CLEAN CATCH Performed at [facility], [place] [address] [place], [place] [address]    Special Requests   Final    NONE Performed at [facility], [place] [address], [place], [place] [address]    Culture   Final    NO GROWTH Performed at [facility], [place] [address], [place], [place] [address]    Report Status 12/25/2018 FINAL  Final      Radiology Studies: No results found.   Scheduled Meds: . enoxaparin (LOVENOX) injection  40 mg Subcutaneous Q24H  . lithium carbonate  300 mg Oral Q12H  . nicotine  14 mg Transdermal QHS  . OLANZapine  20 mg Oral QHS  . Valbenazine Tosylate  1 tablet Oral QHS   Continuous Infusions:   LOS: 5 days    Time spent: 15 minutes spent in the coordination of care today.    Jonnie Finner, DO [facility] Pager [phone]  If 7PM-7AM, please contact night-coverage [email or web address] Password [facility] 12/29/2018, 3:29 PM

## 2018-12-30 LAB — RENAL FUNCTION PANEL
Albumin: 3.2 g/dL — ABNORMAL LOW (ref 3.5–5.0)
Anion gap: 8 (ref 5–15)
BUN: 10 mg/dL (ref 8–23)
CO2: 23 mmol/L (ref 22–32)
Calcium: 9.5 mg/dL (ref 8.9–10.3)
Chloride: 103 mmol/L (ref 98–111)
Creatinine, Ser: 0.66 mg/dL (ref 0.44–1.00)
GFR calc Af Amer: >60 mL/min (ref >60)
GFR calc non Af Amer: >60 mL/min (ref >60)
Glucose, Bld: 138 mg/dL — ABNORMAL HIGH (ref 70–99)
Phosphorus: 3.1 mg/dL (ref 2.5–4.6)
Potassium: 4.1 mmol/L (ref 3.5–5.1)
Sodium: 134 mmol/L — ABNORMAL LOW (ref 135–145)

## 2018-12-30 LAB — NOVEL CORONAVIRUS, NAA (HOSP ORDER, SEND-OUT TO REF LAB; TAT 18-24 HRS): SARS-CoV-2, NAA: NOT DETECTED

## 2018-12-30 LAB — CBC WITH DIFFERENTIAL/PLATELET
Abs Immature Granulocytes: 0.04 10*3/uL (ref 0.00–0.07)
Basophils Absolute: 0 10*3/uL (ref 0.0–0.1)
Basophils Relative: 0 %
Eosinophils Absolute: 2.2 10*3/uL — ABNORMAL HIGH (ref 0.0–0.5)
Eosinophils Relative: 23 %
HCT: 42.2 % (ref 36.0–46.0)
Hemoglobin: 12.5 g/dL (ref 12.0–15.0)
Immature Granulocytes: 0 %
Lymphocytes Relative: 24 %
Lymphs Abs: 2.3 10*3/uL (ref 0.7–4.0)
MCH: 23.3 pg — ABNORMAL LOW (ref 26.0–34.0)
MCHC: 29.6 g/dL — ABNORMAL LOW (ref 30.0–36.0)
MCV: 78.7 fL — ABNORMAL LOW (ref 80.0–100.0)
Monocytes Absolute: 0.7 10*3/uL (ref 0.1–1.0)
Monocytes Relative: 8 %
Neutro Abs: 4.2 10*3/uL (ref 1.7–7.7)
Neutrophils Relative %: 45 %
Platelets: 341 10*3/uL (ref 150–400)
RBC: 5.36 MIL/uL — ABNORMAL HIGH (ref 3.87–5.11)
RDW: 15.9 % — ABNORMAL HIGH (ref 11.5–15.5)
WBC: 9.4 10*3/uL (ref 4.0–10.5)
nRBC: 0 % (ref 0.0–0.2)

## 2018-12-30 LAB — MAGNESIUM: Magnesium: 2 mg/dL (ref 1.7–2.4)

## 2018-12-30 NOTE — Progress Notes (Signed)
Marland Kitchen  PROGRESS NOTE    ATHENA FRICKE  [id number] DOB: Mar 13, 1956 DOA: 12/23/2018 PCP: [facility]   Brief Narrative:   9 bipolar, shcizophre, asthma, hepatitis C treated Mavyret Underlying chronic dementia recent admission 8/10 through 8/20-at that admission psychiatry saw the patient---Felt she should continue her psychotropics and felt at that admission she had a UTI causing altered mental status-her home meds are Zyprexa 30 lithium 450 Ingrezza 80  Admission details are somewhat sparse-I called [name] the patient's psych nurse and got the report that patient has been intermittently leaving on the stove, sleeping with the door open and sleeping naked in the front of the house and has had significant odd behaviors  She also was found on 8/27 to have drooling unresponsiveness and lethargy-unclear precipitating of the event-[name] tells me she brought the patient to the hospital--her usual mental status that patient is coherent and able to discuss things   Assessment & Plan:   Principal Problem:   UTI (urinary tract infection) Active Problems:   Dysarthria   Elevated blood pressure reading   Asthma   Acute confusion   Confusion     - MRI brain finally accomplished 8/29--no stroke     - Rocephin discontinued as urine culture 8/27 not suggestive of infection     - Confusion likely related to schizophrenia     - oriented today; eager to get to SNF; back to baseline?  Bilateral adenoid cysts probably benign-seen on MRI 8/29     - Needs outpatient follow-up when able to be done  Schizophrenia     - Psychiatry saw the patient 8/29 med changes = lithium down to 300 twice daily, valbenazine 1 tablet at bedtime, Zyprexa 20 daily  Awaiting COVID test results. Can go to SNF after.  DVT prophylaxis: Lovenox Code Status: FULL   Disposition Plan: To SNF  Consultants:   Psych  ROS:  Denies N, V, palpitation, CP. Remainder 10-pt ROS is negative for  all not previously mentioned.  Subjective: No acute events ON  Objective: Vitals:   12/29/18 1346 12/29/18 2025 12/30/18 0443 12/30/18 1334  BP: 113/65 121/71 112/71 114/70  Pulse: 74 70 69 66  Resp: 18 18 16 16   Temp: 98.2 F (36.8 C) 98.3 F (36.8 C) 98.9 F (37.2 C) (!) 97.5 F (36.4 C)  TempSrc: Oral   Oral  SpO2: 100% 100% 98% 100%  Weight:      Height:        Intake/Output Summary (Last 24 hours) at 12/30/2018 1449 Last data filed at 12/30/2018 1300 Gross per 24 hour  Intake 240 ml  Output 2300 ml  Net -2060 ml   Filed Weights   12/23/18 1816 12/23/18 2100  Weight: 63 kg 63 kg    Examination:  General: 63 y.o. female resting in bed in NAD Eyes: PERRL, normal sclera ENMT: Nares patent w/o discharge, orophaynx clear, dentition normal, ears w/o discharge/lesions/ulcers Cardiovascular: RRR, +S1, S2, no m/g/r, equal pulses throughout Respiratory: CTABL, no w/r/r, normal WOB GI: BS+, NDNT, no masses noted, no organomegaly noted MSK: No e/c/c Neuro: Alert to name, follows commands   Data Reviewed: I have personally reviewed following labs and imaging studies.  CBC: Recent Labs  Lab 12/23/18 1815 12/23/18 1829 12/24/18 0809 12/29/18 0546 12/30/18 0545  WBC 10.3  --  9.7 8.0 9.4  NEUTROABS 5.4  --   --  2.6 4.2  HGB 12.6 15.0 11.5* 12.3 12.5  HCT 42.9 44.0 38.3 41.6  42.2  MCV 77.3*  --  78.5* 77.9* 78.7*  PLT 367  --  310 345 [id number]   Basic Metabolic Panel: Recent Labs  Lab 12/23/18 1815 12/23/18 1829 12/24/18 0809 12/29/18 0546 12/30/18 0545  NA 135 137 136 136 134*  K 3.7 3.8 4.1 4.0 4.1  CL 102 102 107 105 103  CO2 23  --  23 25 23   GLUCOSE 142* 138* 105* 92 138*  BUN 10 9 7* 10 10  CREATININE 0.71 0.70 0.74 0.74 0.66  CALCIUM 10.6*  --  9.8 9.8 9.5  MG  --   --   --   --  2.0  PHOS  --   --   --   --  3.1   GFR: Estimated Creatinine Clearance: 65.6 mL/min (by C-G formula based on SCr of 0.66 mg/dL). Liver Function Tests: Recent Labs  Lab  12/23/18 1815 12/24/18 0809 12/30/18 0545  AST 23 21  --   ALT 32 27  --   ALKPHOS 81 69  --   BILITOT 0.5 0.7  --   PROT 8.9* 7.3  --   ALBUMIN 4.2 3.2* 3.2*   No results for input(s): LIPASE, AMYLASE in the last 168 hours. No results for input(s): AMMONIA in the last 168 hours. Coagulation Profile: Recent Labs  Lab 12/23/18 1815  INR 1.0   Cardiac Enzymes: No results for input(s): CKTOTAL, CKMB, CKMBINDEX, TROPONINI in the last 168 hours. BNP (last 3 results) No results for input(s): PROBNP in the last 8760 hours. HbA1C: No results for input(s): HGBA1C in the last 72 hours. CBG: No results for input(s): GLUCAP in the last 168 hours. Lipid Profile: No results for input(s): CHOL, HDL, LDLCALC, TRIG, CHOLHDL, LDLDIRECT in the last 72 hours. Thyroid Function Tests: No results for input(s): TSH, T4TOTAL, FREET4, T3FREE, THYROIDAB in the last 72 hours. Anemia Panel: No results for input(s): VITAMINB12, FOLATE, FERRITIN, TIBC, IRON, RETICCTPCT in the last 72 hours. Sepsis Labs: No results for input(s): PROCALCITON, LATICACIDVEN in the last 168 hours.  Recent Results (from the past 240 hour(s))  SARS Coronavirus 2 [facility] order, Performed in [facility] hospital lab) Nasopharyngeal Nasopharyngeal Swab     Status: None   Collection Time: 12/23/18  6:16 PM   Specimen: Nasopharyngeal Swab  Result Value Ref Range Status   SARS Coronavirus 2 NEGATIVE NEGATIVE Final    Comment: (NOTE) If result is NEGATIVE SARS-CoV-2 target nucleic acids are NOT DETECTED. The SARS-CoV-2 RNA is generally detectable in upper and lower  respiratory specimens during the acute phase of infection. The lowest  concentration of SARS-CoV-2 viral copies this assay can detect is 250  copies / mL. A negative result does not preclude SARS-CoV-2 infection  and should not be used as the sole basis for treatment or other  patient management decisions.  A negative result may occur with  improper specimen  collection / handling, submission of specimen other  than nasopharyngeal swab, presence of viral mutation(s) within the  areas targeted by this assay, and inadequate number of viral copies  (<250 copies / mL). A negative result must be combined with clinical  observations, patient history, and epidemiological information. If result is POSITIVE SARS-CoV-2 target nucleic acids are DETECTED. The SARS-CoV-2 RNA is generally detectable in upper and lower  respiratory specimens dur ing the acute phase of infection.  Positive  results are indicative of active infection with SARS-CoV-2.  Clinical  correlation with patient history and other diagnostic information is  necessary  to determine patient infection status.  Positive results do  not rule out bacterial infection or co-infection with other viruses. If result is PRESUMPTIVE POSTIVE SARS-CoV-2 nucleic acids MAY BE PRESENT.   A presumptive positive result was obtained on the submitted specimen  and confirmed on repeat testing.  While 2019 novel coronavirus  (SARS-CoV-2) nucleic acids may be present in the submitted sample  additional confirmatory testing may be necessary for epidemiological  and / or clinical management purposes  to differentiate between  SARS-CoV-2 and other Sarbecovirus currently known to infect humans.  If clinically indicated additional testing with an alternate test  methodology [phone]) is advised. The SARS-CoV-2 RNA is generally  detectable in upper and lower respiratory sp ecimens during the acute  phase of infection. The expected result is Negative. Fact Sheet for Patients:  StrictlyIdeas.no Fact Sheet for Healthcare Providers: BankingDealers.co.za This test is not yet approved or cleared by the [place] FDA and has been authorized for detection and/or diagnosis of SARS-CoV-2 by FDA under an Emergency Use Authorization (EUA).  This EUA will remain in effect  (meaning this test can be used) for the duration of the COVID-19 declaration under Section 564(b)(1) of the Act, 21 U.S.C. section 360bbb-3(b)(1), unless the authorization is terminated or revoked sooner. Performed at [facility], [place] [address] [place], [place] [address]   Culture, Urine     Status: None   Collection Time: 12/23/18  7:53 PM   Specimen: Urine, Clean Catch  Result Value Ref Range Status   Specimen Description   Final    URINE, CLEAN CATCH Performed at [facility], [place] [address] [place], [place] [address]    Special Requests   Final    NONE Performed at [facility], [place] [address] [place], [place] [address]    Culture   Final    NO GROWTH Performed at [facility], [place] [address], [place], [place] [address]    Report Status 12/25/2018 FINAL  Final      Radiology Studies: No results found.   Scheduled Meds: . enoxaparin (LOVENOX) injection  40 mg Subcutaneous Q24H  . lithium carbonate  300 mg Oral Q12H  . nicotine  14 mg Transdermal QHS  . OLANZapine  20 mg Oral QHS  . Valbenazine Tosylate  1 tablet Oral QHS   Continuous Infusions:   LOS: 6 days    Time spent: 15 minutes spent in the coordination of care.   Jonnie Finner, DO [facility] Pager [phone]  If 7PM-7AM, please contact night-coverage [email or web address] Password [email or web address] 12/30/2018, 2:49 PM

## 2018-12-31 DIAGNOSIS — R410 Disorientation, unspecified: Secondary | ICD-10-CM

## 2018-12-31 LAB — RENAL FUNCTION PANEL
Albumin: 3.2 g/dL — ABNORMAL LOW (ref 3.5–5.0)
Anion gap: 5 (ref 5–15)
BUN: 11 mg/dL (ref 8–23)
CO2: 25 mmol/L (ref 22–32)
Calcium: 9.8 mg/dL (ref 8.9–10.3)
Chloride: 105 mmol/L (ref 98–111)
Creatinine, Ser: 0.6 mg/dL (ref 0.44–1.00)
GFR calc Af Amer: >60 mL/min (ref >60)
GFR calc non Af Amer: >60 mL/min (ref >60)
Glucose, Bld: 112 mg/dL — ABNORMAL HIGH (ref 70–99)
Phosphorus: 3.1 mg/dL (ref 2.5–4.6)
Potassium: 4.6 mmol/L (ref 3.5–5.1)
Sodium: 135 mmol/L (ref 135–145)

## 2018-12-31 LAB — CBC WITH DIFFERENTIAL/PLATELET
Abs Immature Granulocytes: 0.04 10*3/uL (ref 0.00–0.07)
Basophils Absolute: 0 10*3/uL (ref 0.0–0.1)
Basophils Relative: 0 %
Eosinophils Absolute: 2.4 10*3/uL — ABNORMAL HIGH (ref 0.0–0.5)
Eosinophils Relative: 24 %
HCT: 39.7 % (ref 36.0–46.0)
Hemoglobin: 11.7 g/dL — ABNORMAL LOW (ref 12.0–15.0)
Immature Granulocytes: 0 %
Lymphocytes Relative: 18 %
Lymphs Abs: 1.8 10*3/uL (ref 0.7–4.0)
MCH: 23 pg — ABNORMAL LOW (ref 26.0–34.0)
MCHC: 29.5 g/dL — ABNORMAL LOW (ref 30.0–36.0)
MCV: 78 fL — ABNORMAL LOW (ref 80.0–100.0)
Monocytes Absolute: 0.9 10*3/uL (ref 0.1–1.0)
Monocytes Relative: 9 %
Neutro Abs: 5 10*3/uL (ref 1.7–7.7)
Neutrophils Relative %: 49 %
Platelets: 343 10*3/uL (ref 150–400)
RBC: 5.09 MIL/uL (ref 3.87–5.11)
RDW: 15.8 % — ABNORMAL HIGH (ref 11.5–15.5)
WBC: 10.1 10*3/uL (ref 4.0–10.5)
nRBC: 0 % (ref 0.0–0.2)

## 2018-12-31 LAB — MAGNESIUM: Magnesium: 2.2 mg/dL (ref 1.7–2.4)

## 2018-12-31 MED ORDER — LITHIUM CARBONATE ER 300 MG PO TBCR: 300.0000 mg | EXTENDED_RELEASE_TABLET | Freq: Two times a day (BID) | ORAL | 1 refills | Status: DC

## 2018-12-31 MED ORDER — OLANZAPINE 20 MG PO TABS: 20.0000 mg | ORAL_TABLET | Freq: Every day | ORAL | 1 refills | Status: DC

## 2018-12-31 NOTE — Discharge Summary (Addendum)
. Physician Discharge Summary  Karen Best [id number] DOB: 03-02-1956 DOA: 12/23/2018  PCP: [facility]  Admit date: 12/23/2018 Discharge date: 12/31/2018  Admitted From: Home Disposition:  Discharged to SNF  Recommendations for Outpatient Follow-up:  1. Follow up with PCP in 1-2 weeks 2. Please obtain BMP/CBC in one week   Discharge Condition: Stable  CODE STATUS: FULL    Brief/Interim Summary: 63 bipolar, shcizophre, asthma, hepatitis C treated [place] Underlying chronic dementia recent admission 8/10 through 8/20-at that admission psychiatry saw the patient---Felt she should continue her psychotropics and felt at that admission she had a UTI causing altered mental status-her home meds are Zyprexa 30 lithium 450 Ingrezza 80  Admission details are somewhat sparse-I called [name] the patient's psych nurse and got the report that patient has been intermittently leaving on the stove, sleeping with the door open and sleeping naked in the front of the house and has had significant odd behaviors  She also was found on 8/27 to have drooling unresponsiveness and lethargy-unclear precipitating of the event-[name] tells me she brought the patient to the hospital--her usual mental status that patient is coherent and able to discuss things  Discharge Diagnoses:  Principal Problem:   Acute confusion Active Problems:   Schizophrenia ([place])   Dysarthria  Confusion - MRI brain finally accomplished 8/29--no stroke; dysarthria is resolved - Rocephin discontinued as urine culture 8/27 not suggestive of infection - Confusion likely related to schizophrenia - oriented today; back to baseline?     - rpt COVID negative; ok to d/c to SNF  Bilateral adenoid cysts probably benign-seen on MRI 8/29 - Needs outpatient follow-up when able to be done  Schizophrenia - Psychiatry saw the patient 8/29 med changes = lithium down to 300 twice daily,  valbenazine 1 tablet at bedtime, Zyprexa 20 daily  Discharge Instructions   Allergies as of 12/31/2018      Reactions   Penicillins Other (See Comments)   Child hood reaction Has patient had a PCN reaction causing immediate rash, facial/tongue/throat swelling, SOB or lightheadedness with hypotension: Yes Has patient had a PCN reaction causing severe rash involving mucus membranes or skin necrosis: No Has patient had a PCN reaction that required hospitalization No Has patient had a PCN reaction occurring within the last 10 years: No If all of the above answers are "NO", then may proceed with C      Medication List    STOP taking these medications   hydrOXYzine 50 MG capsule Commonly known as: VISTARIL     TAKE these medications   cetirizine 10 MG tablet Commonly known as: ZYRTEC Take 10 mg by mouth daily.   famotidine 40 MG tablet Commonly known as: PEPCID Take 40 mg by mouth 2 (two) times daily.   Ingrezza 80 MG Caps Generic drug: Valbenazine Tosylate Take 1 tablet by mouth at bedtime.   lithium carbonate 300 MG CR tablet Commonly known as: LITHOBID Take 1 tablet (300 mg total) by mouth 2 (two) times daily. What changed:   medication strength  how much to take   OLANZapine 20 MG tablet Commonly known as: ZYPREXA Take 1 tablet (20 mg total) by mouth at bedtime. What changed:   additional instructions  Another medication with the same name was removed. Continue taking this medication, and follow the directions you see here.      Contact information for after-discharge care    Destination    [facility] .   Service: Skilled Nursing  Contact information: [place] [id number] [phone]             Allergies  Allergen Reactions  . Penicillins Other (See Comments)    Child hood reaction Has patient had a PCN reaction causing immediate rash, facial/tongue/throat swelling, SOB or lightheadedness with  hypotension: Yes Has patient had a PCN reaction causing severe rash involving mucus membranes or skin necrosis: No Has patient had a PCN reaction that required hospitalization No Has patient had a PCN reaction occurring within the last 10 years: No If all of the above answers are "NO", then may proceed with C    Consultations:  Psych   Procedures/Studies: Ct Head Wo Contrast  Result Date: 12/23/2018 CLINICAL DATA:  Pt has been having difficulty with her speech since last night. Pt has weakness bilateral legs, worse in right leg. EXAM: CT HEAD WITHOUT CONTRAST TECHNIQUE: Contiguous axial images were obtained from the base of the skull through the vertex without intravenous contrast. COMPARISON:  CT head 12/05/2018 FINDINGS: Brain: No evidence of acute infarction, hemorrhage, hydrocephalus, extra-axial collection or mass lesion/mass effect. Vascular: No hyperdense vessel or unexpected calcification. Skull: Normal. Negative for fracture or focal lesion. Sinuses/Orbits: No acute finding. Other: Chronic right mastoid effusion IMPRESSION: No acute intracranial process. Electronically Signed   By: Audie Pinto M.D.   On: 12/23/2018 18:53   Ct Head Wo Contrast  Result Date: 12/05/2018 CLINICAL DATA:  Slurred speech for 1 hour EXAM: CT HEAD WITHOUT CONTRAST TECHNIQUE: Contiguous axial images were obtained from the base of the skull through the vertex without intravenous contrast. COMPARISON:  11/09/2018 FINDINGS: Brain: Mild atrophic changes are noted. No findings to suggest acute hemorrhage, acute infarction or space-occupying mass lesion are noted. Vascular: No hyperdense vessel or unexpected calcification. Skull: Normal. Negative for fracture or focal lesion. Sinuses/Orbits: No acute finding. Other: None. IMPRESSION: Atrophic changes without acute abnormality. Electronically Signed   By: Inez Catalina M.D.   On: 12/05/2018 03:04   Mr Brain Wo Contrast  Result Date: 12/25/2018 CLINICAL DATA:   63 year old female with abnormal speech, right greater than left leg weakness. EXAM: MRI HEAD WITHOUT CONTRAST TECHNIQUE: Multiplanar, multiecho pulse sequences of the brain and surrounding structures were obtained without intravenous contrast. COMPARISON:  Brain MRI 12/05/2018.  Head CT 12/23/2018, and earlier. FINDINGS: Brain: No restricted diffusion to suggest acute infarction. No midline shift, mass effect, evidence of mass lesion, ventriculomegaly, extra-axial collection or acute intracranial hemorrhage. Cervicomedullary junction and pituitary are within normal limits. Stable gray and white matter signal throughout the brain. There is minimal nonspecific white matter T2 and FLAIR hyperintensity, more pronounced in the left hemisphere. No cortical encephalomalacia or chronic blood products identified. The deep gray nuclei, brainstem and cerebellum remain normal for age. Vascular: Major intracranial vascular flow voids are stable. Skull and upper cervical spine: Negative visible cervical spine. Visualized bone marrow signal is within normal limits. Sinuses/Orbits: Leftward gaze but otherwise negative orbits. Paranasal sinuses are clear. Other: There are multiple nasopharyngeal retention cysts suspected in the adenoids (series 4, image 31). There is an associated right mastoid effusion. These findings are unchanged since May this year, but the mastoid effusion is new since 2018. There might also be fluid in the right tympanic cavity. The left mastoids remain clear. Other scalp and face soft tissues appear negative. IMPRESSION: 1. No acute intracranial abnormality. Stable and negative for age non-contrast MRI appearance of the brain. 2. Evidence of chronic right side eustachian tube dysfunction in  the setting of multiple bilateral adenoid cysts, probably benign retention cysts. Recommend follow-up with ENT. Electronically Signed   By: Genevie Ann M.D.   On: 12/25/2018 15:34   Mr Brain Wo Contrast  Result Date:  12/05/2018 CLINICAL DATA:  Slurred speech for 1 hour EXAM: MRI HEAD WITHOUT CONTRAST TECHNIQUE: Multiplanar, multiecho pulse sequences of the brain and surrounding structures were obtained without intravenous contrast. COMPARISON:  09/26/2018 brain MRI FINDINGS: BRAIN: There is no acute infarct, acute hemorrhage or extra-axial collection. The white matter signal is normal for the patient's age. The cerebral and cerebellar volume are age-appropriate. There is no hydrocephalus. The midline structures are normal. VASCULAR: Susceptibility-sensitive sequences show no chronic microhemorrhage or superficial siderosis. The major intracranial arterial and venous sinus flow voids are normal. SKULL AND UPPER CERVICAL SPINE: Calvarial bone marrow signal is normal. There is no skull base mass. The visualized upper cervical spine and soft tissues are normal. SINUSES/ORBITS: There are no fluid levels or advanced mucosal thickening. Right mastoid effusion, unchanged. The orbits are normal. IMPRESSION: No acute intracranial abnormality.  Normal aging brain. Electronically Signed   By: Ulyses Jarred M.D.   On: 12/05/2018 07:00   Dg Chest Port 1 View  Result Date: 12/23/2018 CLINICAL DATA:  Altered mental status EXAM: PORTABLE CHEST 1 VIEW COMPARISON:  09/05/2018 FINDINGS: Heart is normal size. Bibasilar atelectasis. No effusions or acute bony abnormality. IMPRESSION: Minimal bibasilar atelectasis. Electronically Signed   By: Rolm Baptise M.D.   On: 12/23/2018 18:42   US Abdomen Limited Ruq  Result Date: 12/11/2018 CLINICAL DATA:  Abnormal liver function test today. History of gallstones. EXAM: ULTRASOUND ABDOMEN LIMITED RIGHT UPPER QUADRANT COMPARISON:  Ultrasound dated 10/13/2017. FINDINGS: Gallbladder: Again noted are multiple gallstones, largest measuring 4 mm. No gallbladder wall thickening or pericholecystic fluid. No sonographic Murphy's sign elicited during the exam, per the sonographer Common bile duct: Diameter: 3 mm  Liver: No focal lesion identified. Within normal limits in parenchymal echogenicity. Portal vein is patent on color Doppler imaging with normal direction of blood flow towards the liver. Other: None. IMPRESSION: 1. Cholelithiasis without evidence of acute cholecystitis. 2. No bile duct dilatation. 3. Liver appears normal. Electronically Signed   By: Franki Cabot M.D.   On: 12/11/2018 20:15     Subjective: "I can go today?! That's great!"  Discharge Exam: Vitals:   12/30/18 2018 12/31/18 0408  BP: 134/72 126/67  Pulse: 66 (!) 58  Resp: 16 16  Temp: 98.6 F (37 C) 97.6 F (36.4 C)  SpO2: 100% 99%   Vitals:   12/30/18 0443 12/30/18 1334 12/30/18 2018 12/31/18 0408  BP: 112/71 114/70 134/72 126/67  Pulse: 69 66 66 (!) 58  Resp: 16 16 16 16   Temp: 98.9 F (37.2 C) (!) 97.5 F (36.4 C) 98.6 F (37 C) 97.6 F (36.4 C)  TempSrc:  Oral Oral   SpO2: 98% 100% 100% 99%  Weight:      Height:        General: 63 y.o. female resting in bed in NAD Eyes: PERRL, normal sclera ENMT: Nares patent w/o discharge, orophaynx clear, dentition normal, ears w/o discharge/lesions/ulcers Cardiovascular: RRR, +S1, S2, no m/g/r, equal pulses throughout Respiratory: CTABL, no w/r/r, normal WOB GI: BS+, NDNT, no masses noted, no organomegaly noted MSK: No e/c/c Skin: No rashes, bruises, ulcerations noted Neuro: Alert to name, follows commands    The results of significant diagnostics from this hospitalization (including imaging, microbiology, ancillary and laboratory) are listed below for reference.  Microbiology: Recent Results (from the past 240 hour(s))  SARS Coronavirus 2 [facility] order, Performed in [facility] hospital lab) Nasopharyngeal Nasopharyngeal Swab     Status: None   Collection Time: 12/23/18  6:16 PM   Specimen: Nasopharyngeal Swab  Result Value Ref Range Status   SARS Coronavirus 2 NEGATIVE NEGATIVE Final    Comment: (NOTE) If result is NEGATIVE SARS-CoV-2 target  nucleic acids are NOT DETECTED. The SARS-CoV-2 RNA is generally detectable in upper and lower  respiratory specimens during the acute phase of infection. The lowest  concentration of SARS-CoV-2 viral copies this assay can detect is 250  copies / mL. A negative result does not preclude SARS-CoV-2 infection  and should not be used as the sole basis for treatment or other  patient management decisions.  A negative result may occur with  improper specimen collection / handling, submission of specimen other  than nasopharyngeal swab, presence of viral mutation(s) within the  areas targeted by this assay, and inadequate number of viral copies  (<250 copies / mL). A negative result must be combined with clinical  observations, patient history, and epidemiological information. If result is POSITIVE SARS-CoV-2 target nucleic acids are DETECTED. The SARS-CoV-2 RNA is generally detectable in upper and lower  respiratory specimens dur ing the acute phase of infection.  Positive  results are indicative of active infection with SARS-CoV-2.  Clinical  correlation with patient history and other diagnostic information is  necessary to determine patient infection status.  Positive results do  not rule out bacterial infection or co-infection with other viruses. If result is PRESUMPTIVE POSTIVE SARS-CoV-2 nucleic acids MAY BE PRESENT.   A presumptive positive result was obtained on the submitted specimen  and confirmed on repeat testing.  While 2019 novel coronavirus  (SARS-CoV-2) nucleic acids may be present in the submitted sample  additional confirmatory testing may be necessary for epidemiological  and / or clinical management purposes  to differentiate between  SARS-CoV-2 and other Sarbecovirus currently known to infect humans.  If clinically indicated additional testing with an alternate test  methodology [phone]) is advised. The SARS-CoV-2 RNA is generally  detectable in upper and lower  respiratory sp ecimens during the acute  phase of infection. The expected result is Negative. Fact Sheet for Patients:  StrictlyIdeas.no Fact Sheet for Healthcare Providers: BankingDealers.co.za This test is not yet approved or cleared by the [place] FDA and has been authorized for detection and/or diagnosis of SARS-CoV-2 by FDA under an Emergency Use Authorization (EUA).  This EUA will remain in effect (meaning this test can be used) for the duration of the COVID-19 declaration under Section 564(b)(1) of the Act, 21 U.S.C. section 360bbb-3(b)(1), unless the authorization is terminated or revoked sooner. Performed at [facility], [place] [address] [place], [place] [address]   Culture, Urine     Status: None   Collection Time: 12/23/18  7:53 PM   Specimen: Urine, Clean Catch  Result Value Ref Range Status   Specimen Description   Final    URINE, CLEAN CATCH Performed at [facility], [place] [address] [place], [name] [address]    Special Requests   Final    NONE Performed at [facility], [place] [address] [place], [place] [address]    Culture   Final    NO GROWTH Performed at [facility], [place] [address], [place], [place] [address]    Report Status 12/25/2018 FINAL  Final  Novel Coronavirus, NAA (  hospital order; send-out to ref lab)     Status: None   Collection Time: 12/29/18 10:43 AM   Specimen: Nasopharyngeal Swab; Respiratory  Result Value Ref Range Status   SARS-CoV-2, NAA NOT DETECTED NOT DETECTED Final    Comment: (NOTE) This nucleic acid amplification test was developed and its performance characteristics determined by [employer]. Nucleic acid amplification tests include PCR and TMA. This test has not been FDA cleared or approved. This test has been authorized by FDA under an Emergency Use Authorization (EUA). This test is only authorized for  the duration of time the declaration that circumstances exist justifying the authorization of the emergency use of in vitro diagnostic tests for detection of SARS-CoV-2 virus and/or diagnosis of COVID-19 infection under section 564(b)(1) of the Act, 21 U.S.C. [id number]) (1), unless the authorization is terminated or revoked sooner. When diagnostic testing is negative, the possibility of a false negative result should be considered in the context of a patient's recent exposures and the presence of clinical signs and symptoms consistent with COVID-19. An individual without symptoms of COVID- 19 and who is not shedding SARS-CoV-2 vi rus would expect to have a negative (not detected) result in this assay. Performed At: [facility] [place], [place] [id number] Rush Farmer MD [id number]    [place]  Final    Comment: Performed at [place] [address], [place], [place] [address]     Labs: BNP (last 3 results) No results for input(s): BNP in the last 8760 hours. Basic Metabolic Panel: Recent Labs  Lab 12/24/18 0809 12/29/18 0546 12/30/18 0545 12/31/18 0538  NA 136 136 134* 135  K 4.1 4.0 4.1 4.6  CL 107 105 103 105  CO2 23 25 23 25   GLUCOSE 105* 92 138* 112*  BUN 7* 10 10 11   CREATININE 0.74 0.74 0.66 0.60  CALCIUM 9.8 9.8 9.5 9.8  MG  --   --  2.0 2.2  PHOS  --   --  3.1 3.1   Liver Function Tests: Recent Labs  Lab 12/24/18 0809 12/30/18 0545 12/31/18 0538  AST 21  --   --   ALT 27  --   --   ALKPHOS 69  --   --   BILITOT 0.7  --   --   PROT 7.3  --   --   ALBUMIN 3.2* 3.2* 3.2*   No results for input(s): LIPASE, AMYLASE in the last 168 hours. No results for input(s): AMMONIA in the last 168 hours. CBC: Recent Labs  Lab 12/24/18 0809 12/29/18 0546 12/30/18 0545 12/31/18 0538  WBC 9.7 8.0 9.4 10.1  NEUTROABS  --  2.6 4.2 5.0  HGB 11.5* 12.3 12.5 11.7*  HCT 38.3 41.6 42.2 39.7   MCV 78.5* 77.9* 78.7* 78.0*  PLT 310 345 341 343   Cardiac Enzymes: No results for input(s): CKTOTAL, CKMB, CKMBINDEX, TROPONINI in the last 168 hours. BNP: Invalid input(s): POCBNP CBG: No results for input(s): GLUCAP in the last 168 hours. D-Dimer No results for input(s): DDIMER in the last 72 hours. Hgb A1c No results for input(s): HGBA1C in the last 72 hours. Lipid Profile No results for input(s): CHOL, HDL, LDLCALC, TRIG, CHOLHDL, LDLDIRECT in the last 72 hours. Thyroid function studies No results for input(s): TSH, T4TOTAL, T3FREE, THYROIDAB in the last 72 hours.  Invalid input(s): FREET3 Anemia work up No results for input(s): VITAMINB12, FOLATE, FERRITIN, TIBC, IRON, RETICCTPCT in the last 72 hours. Urinalysis  Component Value Date/Time   COLORURINE YELLOW 12/23/2018 1841   APPEARANCEUR HAZY (A) 12/23/2018 1841   LABSPEC 1.005 12/23/2018 1841   PHURINE 7.0 12/23/2018 1841   GLUCOSEU NEGATIVE 12/23/2018 1841   HGBUR SMALL (A) 12/23/2018 1841   BILIRUBINUR NEGATIVE 12/23/2018 1841   KETONESUR NEGATIVE 12/23/2018 1841   PROTEINUR NEGATIVE 12/23/2018 1841   UROBILINOGEN 0.2 10/26/2018 1355   NITRITE POSITIVE (A) 12/23/2018 1841   LEUKOCYTESUR LARGE (A) 12/23/2018 1841   Sepsis Labs Invalid input(s): PROCALCITONIN,  WBC,  LACTICIDVEN Microbiology Recent Results (from the past 240 hour(s))  SARS Coronavirus 2 [facility] order, Performed in [facility] hospital lab) Nasopharyngeal Nasopharyngeal Swab     Status: None   Collection Time: 12/23/18  6:16 PM   Specimen: Nasopharyngeal Swab  Result Value Ref Range Status   SARS Coronavirus 2 NEGATIVE NEGATIVE Final    Comment: (NOTE) If result is NEGATIVE SARS-CoV-2 target nucleic acids are NOT DETECTED. The SARS-CoV-2 RNA is generally detectable in upper and lower  respiratory specimens during the acute phase of infection. The lowest  concentration of SARS-CoV-2 viral copies this assay can detect is 250  copies /  mL. A negative result does not preclude SARS-CoV-2 infection  and should not be used as the sole basis for treatment or other  patient management decisions.  A negative result may occur with  improper specimen collection / handling, submission of specimen other  than nasopharyngeal swab, presence of viral mutation(s) within the  areas targeted by this assay, and inadequate number of viral copies  (<250 copies / mL). A negative result must be combined with clinical  observations, patient history, and epidemiological information. If result is POSITIVE SARS-CoV-2 target nucleic acids are DETECTED. The SARS-CoV-2 RNA is generally detectable in upper and lower  respiratory specimens dur ing the acute phase of infection.  Positive  results are indicative of active infection with SARS-CoV-2.  Clinical  correlation with patient history and other diagnostic information is  necessary to determine patient infection status.  Positive results do  not rule out bacterial infection or co-infection with other viruses. If result is PRESUMPTIVE POSTIVE SARS-CoV-2 nucleic acids MAY BE PRESENT.   A presumptive positive result was obtained on the submitted specimen  and confirmed on repeat testing.  While 2019 novel coronavirus  (SARS-CoV-2) nucleic acids may be present in the submitted sample  additional confirmatory testing may be necessary for epidemiological  and / or clinical management purposes  to differentiate between  SARS-CoV-2 and other Sarbecovirus currently known to infect humans.  If clinically indicated additional testing with an alternate test  methodology [phone]) is advised. The SARS-CoV-2 RNA is generally  detectable in upper and lower respiratory sp ecimens during the acute  phase of infection. The expected result is Negative. Fact Sheet for Patients:  StrictlyIdeas.no Fact Sheet for Healthcare Providers: BankingDealers.co.za This test is  not yet approved or cleared by the [place] FDA and has been authorized for detection and/or diagnosis of SARS-CoV-2 by FDA under an Emergency Use Authorization (EUA).  This EUA will remain in effect (meaning this test can be used) for the duration of the COVID-19 declaration under Section 564(b)(1) of the Act, 21 U.S.C. section 360bbb-3(b)(1), unless the authorization is terminated or revoked sooner. Performed at [facility], [place] [address], [place],  [address]   Culture, Urine     Status: None   Collection Time: 12/23/18  7:53 PM   Specimen: Urine, Clean Catch  Result Value Ref Range  Status   Specimen Description   Final    URINE, CLEAN CATCH Performed at [facility], [place] [address], [place], [place] [address]    Special Requests   Final    NONE Performed at [facility], [place] [address] [place], [place] [address]    Culture   Final    NO GROWTH Performed at [facility], [place] [address] [place], [place] [address]    Report Status 12/25/2018 FINAL  Final  Novel Coronavirus, NAA (hospital order; send-out to ref lab)     Status: None   Collection Time: 12/29/18 10:43 AM   Specimen: Nasopharyngeal Swab; Respiratory  Result Value Ref Range Status   SARS-CoV-2, NAA NOT DETECTED NOT DETECTED Final    Comment: (NOTE) This nucleic acid amplification test was developed and its performance characteristics determined by [employer]. Nucleic acid amplification tests include PCR and TMA. This test has not been FDA cleared or approved. This test has been authorized by FDA under an Emergency Use Authorization (EUA). This test is only authorized for the duration of time the declaration that circumstances exist justifying the authorization of the emergency use of in vitro diagnostic tests for detection of SARS-CoV-2 virus and/or diagnosis of COVID-19 infection under section 564(b)(1) of the Act, 21 U.S.C.  [id number]) (1), unless the authorization is terminated or revoked sooner. When diagnostic testing is negative, the possibility of a false negative result should be considered in the context of a patient's recent exposures and the presence of clinical signs and symptoms consistent with COVID-19. An individual without symptoms of COVID- 19 and who is not shedding SARS-CoV-2 vi rus would expect to have a negative (not detected) result in this assay. Performed At: [facility] [place], [place] [id number] Rush Farmer MD [id number]    [place]  Final    Comment: Performed at [place] [address] [place], [place] [address]     Time coordinating discharge: 35 minutes  SIGNED:   Jonnie Finner, DO  [facility] 12/31/2018, 7:02 AM Pager   If 7PM-7AM, please contact night-coverage [email or web address] Password [email or web address]

## 2018-12-31 NOTE — Progress Notes (Signed)
Patient transported to [place] by [facility].  Report attempted x 2.    Virginia [place], RN

## 2018-12-31 NOTE — TOC Transition Note (Addendum)
Transition of Care [facility]) - CM/SW Discharge Note   Patient Details  Name: Karen Best MRN: [id number] Date of Birth: [birth year]  Transition of Care [facility]) CM/SW Contact:  Lia Hopping, [place] Phone Number: 12/31/2018, 11:04 AM   Clinical Narrative:    Patient discharge to: SNF [facility] to transport.  CSW notified ACTT team-[name]-[phone] and Daughter Levada Dy [phone] Nurse call report to: [phone] Room: 124A  Final next level of care: Skilled Nursing Facility Barriers to Discharge: Inadequate or no insurance, Continued Medical Work up, SNF Pending bed offer   Patient Goals and CMS Choice Patient states their goals for this hospitalization and ongoing recovery are:: "do whatever I need to do"      Discharge Placement PASRR number recieved: 12/31/18            Patient chooses bed at: [facility] Patient to be transferred to facility by: [facility]   Patient and family notified of of transfer: 12/31/18  Discharge Plan and Services In-house Referral: Clinical Social Work   Post Acute Care Choice: [place]                               Social Determinants of Health (SDOH) Interventions     Readmission Risk Interventions No flowsheet data found.

## 2018-12-31 NOTE — Progress Notes (Signed)
Physical Therapy Treatment Patient Details Name: Karen Best MRN: [id number] DOB: 1955-09-10 Today's Date: 12/31/2018    History of Present Illness 63 y.o. female with medical history significant of asthma, bipolar 1 disorder, schizophrenia, fibromyalgia, hep C, tobacco abuse presenting to the hospital with a chief complaint of "I had a stroke."  Pt admitted for UTI and acute metabolic encephalopathy.  Recent admission a week ago for same.    PT Comments    Pt agreeable for OOB to recliner (likely only because linen soaked with urine).  Pt requiring at least mod assist for transfers at this time.  Continue to recommend SNF upon d/c.    Follow Up Recommendations  SNF;Supervision/Assistance - 24 hour     Equipment Recommendations  Rolling walker with 5" wheels    Recommendations for Other Services       Precautions / Restrictions Precautions Precautions: Fall Precaution Comments: pt very fearful of falling    Mobility  Bed Mobility Overal bed mobility: Needs Assistance Bed Mobility: Supine to Sit     Supine to sit: Min assist     General bed mobility comments: assist for trunk and cues for scooting to EOB  Transfers Overall transfer level: Needs assistance Equipment used: Rolling walker (2 wheeled) Transfers: Sit to/from [facility] Sit to Stand: Mod assist Stand pivot transfers: Mod assist       General transfer comment: provided RW per pt request however pt initially only wanted to hold RW on L and HHA on R, able to hold RW bil with cues, assist for weakness and steadying, pt with difficulty advancing R LE  Ambulation/Gait             General Gait Details: patient declined   Chief Strategy Officer    Modified Rankin (Stroke Patients Only)       Balance Overall balance assessment: Needs assistance         Standing balance support: Bilateral upper extremity supported Standing balance-Leahy Scale:  Poor Standing balance comment: Reliant on UE and external support.                             Cognition Arousal/Alertness: Awake/alert Behavior During Therapy: Flat affect Overall Cognitive Status: No family/caregiver present to determine baseline cognitive functioning Area of Impairment: Following commands;Safety/judgement;Problem solving                     Memory: Decreased short-term memory Following Commands: Follows one step commands with increased time Safety/Judgement: Decreased awareness of safety;Decreased awareness of deficits   Problem Solving: Slow processing;Requires verbal cues;Difficulty sequencing General Comments: pt not very expressive      Exercises      General Comments        Pertinent Vitals/Pain Pain Assessment: No/denies pain Faces Pain Scale: No hurt Pain Intervention(s): Repositioned    Home Living                      Prior Function            PT Goals (current goals can now be found in the care plan section) Progress towards PT goals: Progressing toward goals    Frequency    Min 2X/week      PT Plan Current plan remains appropriate    Co-evaluation  AM-PAC PT "6 Clicks" Mobility   Outcome Measure  Help needed turning from your back to your side while in a flat bed without using bedrails?: A Little Help needed moving from lying on your back to sitting on the side of a flat bed without using bedrails?: A Lot Help needed moving to and from a bed to a chair (including a wheelchair)?: A Lot Help needed standing up from a chair using your arms (e.g., wheelchair or bedside chair)?: A Lot Help needed to walk in hospital room?: A Lot Help needed climbing 3-5 steps with a railing? : A Lot 6 Click Score: 13    End of Session Equipment Utilized During Treatment: Gait belt Activity Tolerance: Patient tolerated treatment well Patient left: in chair;with chair alarm set;with call bell/phone  within reach;with nursing/sitter in room   PT Visit Diagnosis: Unsteadiness on feet (R26.81);Muscle weakness (generalized) (M62.81)     Time: [id number] PT Time Calculation (min) (ACUTE ONLY): 10 min  Charges:  $Therapeutic Activity: 8-22 mins                    Carmelia Bake, PT, DPT Acute Rehabilitation Services Office: [phone] Pager: [phone]   Trena Platt 12/31/2018, 1:19 PM

## 2019-02-07 ENCOUNTER — Emergency Department (HOSPITAL_COMMUNITY)
Admission: EM | Admit: 2019-02-07 | Discharge: 2019-02-07 | Disposition: A | Discharge status: Home / Self Care | Payer: Medicaid Other | Attending: Emergency Medicine | Admitting: Emergency Medicine

## 2019-02-07 ENCOUNTER — Other Ambulatory Visit: Payer: Self-pay

## 2019-02-07 ENCOUNTER — Emergency Department (HOSPITAL_COMMUNITY): Payer: Medicaid Other

## 2019-02-07 DIAGNOSIS — J45909 Unspecified asthma, uncomplicated: Secondary | ICD-10-CM | POA: Diagnosis not present

## 2019-02-07 DIAGNOSIS — F17210 Nicotine dependence, cigarettes, uncomplicated: Secondary | ICD-10-CM | POA: Insufficient documentation

## 2019-02-07 DIAGNOSIS — R531 Weakness: Secondary | ICD-10-CM

## 2019-02-07 DIAGNOSIS — Z79899 Other long term (current) drug therapy: Secondary | ICD-10-CM | POA: Insufficient documentation

## 2019-02-07 LAB — BASIC METABOLIC PANEL
Anion gap: 7 (ref 5–15)
BUN: 6 mg/dL — ABNORMAL LOW (ref 8–23)
CO2: 24 mmol/L (ref 22–32)
Calcium: 10.1 mg/dL (ref 8.9–10.3)
Chloride: 105 mmol/L (ref 98–111)
Creatinine, Ser: 0.76 mg/dL (ref 0.44–1.00)
GFR calc Af Amer: >60 mL/min (ref >60)
GFR calc non Af Amer: >60 mL/min (ref >60)
Glucose, Bld: 120 mg/dL — ABNORMAL HIGH (ref 70–99)
Potassium: 4.3 mmol/L (ref 3.5–5.1)
Sodium: 136 mmol/L (ref 135–145)

## 2019-02-07 LAB — URINALYSIS, ROUTINE W REFLEX MICROSCOPIC
Bilirubin Urine: NEGATIVE
Glucose, UA: NEGATIVE mg/dL
Hgb urine dipstick: NEGATIVE
Ketones, ur: NEGATIVE mg/dL
Leukocytes,Ua: NEGATIVE
Nitrite: NEGATIVE
Protein, ur: NEGATIVE mg/dL
Specific Gravity, Urine: 1.008 (ref 1.005–1.030)
pH: 7 (ref 5.0–8.0)

## 2019-02-07 LAB — CBC
HCT: 46.2 % — ABNORMAL HIGH (ref 36.0–46.0)
Hemoglobin: 13.5 g/dL (ref 12.0–15.0)
MCH: 23 pg — ABNORMAL LOW (ref 26.0–34.0)
MCHC: 29.2 g/dL — ABNORMAL LOW (ref 30.0–36.0)
MCV: 78.6 fL — ABNORMAL LOW (ref 80.0–100.0)
Platelets: 346 10*3/uL (ref 150–400)
RBC: 5.88 MIL/uL — ABNORMAL HIGH (ref 3.87–5.11)
RDW: 16.9 % — ABNORMAL HIGH (ref 11.5–15.5)
WBC: 7.5 10*3/uL (ref 4.0–10.5)
nRBC: 0 % (ref 0.0–0.2)

## 2019-02-07 MED ORDER — SODIUM CHLORIDE 0.9% FLUSH: 3.0000 mL | Freq: Once | INTRAVENOUS | Status: DC

## 2019-02-07 NOTE — ED Triage Notes (Signed)
Pt arrives via EMS from home with generalized weakness, mailase for the last several days. Pt has been afebrile. C/o nausea, denies vomiting/diarrhea/no known COVID exposures. Neg stroke scale. cbg 157. B/p 172/88, hr 84, 99%RA. Alert, oriented x4. Ambulatory.

## 2019-02-07 NOTE — Discharge Instructions (Signed)
Please follow up with neurology. Return for worsening symptoms, fever.

## 2019-02-07 NOTE — ED Provider Notes (Signed)
[facility] [facility] EMERGENCY DEPARTMENT Provider Note   CSN: [id number] Arrival date & time: 02/07/19  1222     History   Chief Complaint Chief Complaint  Patient presents with  . Weakness    HPI Karen Best is a 63 y.o. female.     63 yo F with   The history is provided by the patient.  Weakness Severity:  Moderate Onset quality:  Sudden Duration:  3 days Timing:  Constant Progression:  Worsening Chronicity:  New Relieved by:  Nothing Worsened by:  Nothing Ineffective treatments:  None tried Associated symptoms: no arthralgias, no chest pain, no dizziness, no dysuria, no fever, no headaches, no myalgias, no nausea, no shortness of breath, no urgency and no vomiting     Past Medical History:  Diagnosis Date  . Asthma   . Bipolar 1 disorder ([place])   . Fibromyalgia   . Schizophrenia ([place])   . Tobacco abuse     Patient Active Problem List   Diagnosis Date Noted  . Acute confusion 12/29/2018  . Toxic metabolic encephalopathy [id number]  . Dysarthria 12/23/2018  . Elevated blood pressure reading 12/23/2018  . Asthma 12/23/2018  . Delirium due to another medical condition   . UTI (urinary tract infection) 12/06/2018  . Acute metabolic encephalopathy [id number]  . 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 Psychosis ([place]) 09/16/2013  . Schizophrenia [facility])     Past Surgical History:  Procedure Laterality Date  . CESAREAN SECTION    . TUBAL LIGATION       OB History   No obstetric history on file.      Home Medications    Prior to Admission medications   Medication Sig Start Date End Date Taking? Authorizing Provider  acetaminophen (TYLENOL) 325 MG tablet Take 650 mg by mouth every 6 (six) hours as needed for mild pain or moderate pain.   Yes [provider]  cetirizine (ZYRTEC) 10 MG tablet Take 10 mg by mouth daily.   Yes [provider]  famotidine (PEPCID) 40 MG tablet Take 40 mg by mouth 2 (two) times  daily.   Yes [provider]  hydrOXYzine (ATARAX/VISTARIL) 50 MG tablet Take 50 mg by mouth at bedtime.   Yes [provider]  ibuprofen (ADVIL) 200 MG tablet Take 400 mg by mouth every 6 (six) hours as needed for headache or mild pain.   Yes [provider]  lithium carbonate (ESKALITH) 450 MG CR tablet Take 450 mg by mouth 2 (two) times daily.   Yes [provider]  OLANZapine (ZYPREXA) 10 MG tablet Take 10 mg by mouth at bedtime.   Yes [provider]  OLANZapine (ZYPREXA) 20 MG tablet Take 1 tablet (20 mg total) by mouth at bedtime. 12/31/18 03/01/19 Yes Kyle, Tyrone A, DO  Valbenazine Tosylate (INGREZZA) 80 MG CAPS Take 1 tablet by mouth at bedtime.    Yes [provider]  lithium carbonate (LITHOBID) 300 MG CR tablet Take 1 tablet (300 mg total) by mouth 2 (two) times daily. Patient not taking: Reported on 02/07/2019 12/31/18 03/01/19  Jonnie Finner, DO    Family History Family History  Problem Relation Age of Onset  . Liver cancer Neg Hx   . Liver disease Neg Hx     Social History Social History   Tobacco Use  . Smoking status: Current Every Day Smoker    Packs/day: 0.50    Types: Cigarettes  . Smokeless tobacco: Never Used  Substance Use Topics  . Alcohol use: Yes    Comment: occ  . Drug use: Yes    Types: Cocaine    Comment: crack cocaine (last use 10/25/2018)     Allergies   Penicillins   Review of Systems Review of Systems  Constitutional: Negative for chills and fever.  HENT: Negative for congestion and rhinorrhea.   Eyes: Negative for redness and visual disturbance.  Respiratory: Negative for shortness of breath and wheezing.   Cardiovascular: Negative for chest pain and palpitations.  Gastrointestinal: Negative for nausea and vomiting.  Genitourinary: Negative for dysuria and urgency.  Musculoskeletal: Negative for arthralgias and myalgias.  Skin: Negative for pallor and wound.  Neurological: Positive for  weakness. Negative for dizziness and headaches.     Physical Exam Updated Vital Signs BP (!) 147/68   Pulse 68   Temp 98.2 F (36.8 C) (Oral)   Resp 13   Ht 5\' 5"  (1.651 m)   Wt 59 kg   SpO2 100%   BMI 21.63 kg/m   Physical Exam Vitals signs and nursing note reviewed.  Constitutional:      General: She is not in acute distress.    Appearance: She is well-developed. She is not diaphoretic.  HENT:     Head: Normocephalic and atraumatic.  Eyes:     Pupils: Pupils are equal, round, and reactive to light.  Neck:     Musculoskeletal: Normal range of motion and neck supple.  Cardiovascular:     Rate and Rhythm: Normal rate and regular rhythm.     Heart sounds: No murmur. No friction rub. No gallop.   Pulmonary:     Effort: Pulmonary effort is normal.     Breath sounds: No wheezing or rales.  Abdominal:     General: There is no distension.     Palpations: Abdomen is soft.     Tenderness: There is no abdominal tenderness.  Musculoskeletal:        General: No tenderness.     Comments: Signs of muscle wasting to the lower extremities.,  Pulse motor and sensation intact distally.  4-5 muscle strength bilaterally.  Skin:    General: Skin is warm and dry.  Neurological:     Mental Status: She is alert and oriented to person, place, and time.  Psychiatric:        Behavior: Behavior normal.      ED Treatments / Results  Labs (all labs ordered are listed, but only abnormal results are displayed) Labs Reviewed  BASIC METABOLIC PANEL - Abnormal; Notable for the following components:      Result Value   Glucose, Bld 120 (*)    BUN 6 (*)    All other components within normal limits  CBC - Abnormal; Notable for the following components:   RBC 5.88 (*)    HCT 46.2 (*)    MCV 78.6 (*)    MCH 23.0 (*)    MCHC 29.2 (*)    RDW 16.9 (*)    All other components within normal limits  URINALYSIS, ROUTINE W REFLEX MICROSCOPIC  CBG MONITORING, ED    EKG EKG Interpretation   Date/Time:  Monday February 07 2019 12:59:13 EDT Ventricular Rate:  76 PR Interval:  142 QRS Duration: 92 QT Interval:  384 QTC Calculation: 432 R Axis:   -30 Text Interpretation:  Normal sinus rhythm Right atrial enlargement Left axis deviation Pulmonary disease pattern Abnormal ECG No significant change since last tracing Confirmed by Deno Etienne [phone])  on 02/07/2019 5:15:56 PM   Radiology Dg Chest Port 1 View  Result Date: 02/07/2019 CLINICAL DATA:  Chest pain and shortness of breath EXAM: PORTABLE CHEST 1 VIEW COMPARISON:  December 23, 2018 FINDINGS: No edema or consolidation. Heart size and pulmonary vascularity are normal. No adenopathy. Small metallic fragments are noted overlying the left shoulder, stable. IMPRESSION: No edema or consolidation.  Stable cardiac silhouette. Electronically Signed   By: Lowella Grip III M.D.   On: 02/07/2019 17:51    Procedures Procedures (including critical care time)  Medications Ordered in ED Medications  sodium chloride flush (NS) 0.9 % injection 3 mL (has no administration in time range)     Initial Impression / Assessment and Plan / ED Course  I have reviewed the triage vital signs and the nursing notes.  Pertinent labs & imaging results that were available during my care of the patient were reviewed by me and considered in my medical decision making (see chart for details).        63 yo F with a chief complaint of lower extremity weakness.  This been going on for at least 3 months she said.  Has been persistent since she was recently in the hospital.  MRI was performed at that time and negative.  As patient has had no significant change in her symptoms I recommend that she follow-up as an outpatient with a neurologist.  7:34 PM:  I have discussed the diagnosis/risks/treatment options with the patient and believe the pt to be eligible for discharge home to follow-up with PCP, neuro. We also discussed returning to the ED immediately  if new or worsening sx occur. We discussed the sx which are most concerning (e.g., sudden worsening pain, fever, inability to tolerate by mouth) that necessitate immediate return. Medications administered to the patient during their visit and any new prescriptions provided to the patient are listed below.  Medications given during this visit Medications  sodium chloride flush (NS) 0.9 % injection 3 mL (has no administration in time range)     The patient appears reasonably screen and/or stabilized for discharge and I doubt any other medical condition or other [facility] requiring further screening, evaluation, or treatment in the ED at this time prior to discharge.    Final Clinical Impressions(s) / ED Diagnoses   Final diagnoses:  Generalized weakness    ED Discharge Orders         Ordered    Ambulatory referral to Neurology    Comments: An appointment is requested in approximately: 1 week   02/07/19 [place], Sally Menard, DO 02/07/19 1934

## 2019-02-10 ENCOUNTER — Encounter: Payer: Self-pay | Admitting: Neurology

## 2019-02-10 ENCOUNTER — Ambulatory Visit: Payer: Medicaid Other | Admitting: Neurology

## 2019-02-10 ENCOUNTER — Other Ambulatory Visit: Payer: Self-pay

## 2019-02-10 VITALS — BP 148/88 | HR 79 | Temp 98.4°F | Ht 65.0 in | Wt 134.0 lb

## 2019-02-10 DIAGNOSIS — E538 Deficiency of other specified B group vitamins: Secondary | ICD-10-CM

## 2019-02-10 DIAGNOSIS — R269 Unspecified abnormalities of gait and mobility: Secondary | ICD-10-CM | POA: Diagnosis not present

## 2019-02-10 DIAGNOSIS — G9340 Encephalopathy, unspecified: Secondary | ICD-10-CM | POA: Diagnosis not present

## 2019-02-10 NOTE — Patient Instructions (Signed)
Reduce the Ingreza to 40 mg a day.

## 2019-02-10 NOTE — Progress Notes (Signed)
Reason for visit: Walking problem, weakness, confusion  Referring physician: Dr. Harland German is a 63 y.o. female  History of present illness:  Ms. Lewison is a 63 year old right-handed black female with a history of schizophrenia and a history of hepatitis C.  The patient has been on Zyprexa for a long time, and on lithium.  The patient was increased on the lithium dose from 300 twice daily to 450 mg twice daily about 1 year ago and in December 2019 was placed on Ingrezza for severe tardive dyskinesia.  She initially was started on 40 mg daily but then went to 80 mg daily.  She had an excellent response to the [place] but sometime in April or May 2020 she began having some problems with slurring of speech and difficulty with walking.  This has persisted throughout the summer and within the last 2 months, she has developed some increasing problems with confusion, some drowsiness, she is unable to dress herself adequately at times.  She has not had any falls but she has some gait instability.  She denies issues controlling the bowels or the bladder.  She reports that when she is up walking she may have some discomfort in the arms and legs from the elbows down and from the hips down to the feet.  She may have some numbness with this as well.  She does have some occasional headaches, she denies any vision changes.  She has had at least 3 MRI evaluations of the brain and four CT scan evaluations of the brain in the last 6 months, all have been unremarkable.  The most recent lithium level was on 23 December 2018 with a level of 1.13, in the therapeutic range.  She is sent to this office for an evaluation.  Past Medical History:  Diagnosis Date  . Asthma   . Bipolar 1 disorder ([place])   . Fibromyalgia   . Schizophrenia ([place])   . Tobacco abuse     Past Surgical History:  Procedure Laterality Date  . CESAREAN SECTION    . TUBAL LIGATION      Family History  Problem Relation  Age of Onset  . Liver cancer Neg Hx   . Liver disease Neg Hx     Social history:  reports that she has been smoking cigarettes. She has been smoking about 0.50 packs per day. She has never used smokeless tobacco. She reports current alcohol use. She reports current drug use. Drug: Cocaine.  Medications:  Prior to Admission medications   Medication Sig Start Date End Date Taking? Authorizing Provider  acetaminophen (TYLENOL) 325 MG tablet Take 650 mg by mouth every 6 (six) hours as needed for mild pain or moderate pain.   Yes [provider]  cetirizine (ZYRTEC) 10 MG tablet Take 10 mg by mouth daily.   Yes [provider]  famotidine (PEPCID) 40 MG tablet Take 40 mg by mouth 2 (two) times daily.   Yes [provider]  hydrOXYzine (ATARAX/VISTARIL) 50 MG tablet Take 50 mg by mouth at bedtime.   Yes [provider]  ibuprofen (ADVIL) 200 MG tablet Take 400 mg by mouth every 6 (six) hours as needed for headache or mild pain.   Yes [provider]  lithium carbonate (ESKALITH) 450 MG CR tablet Take 450 mg by mouth 2 (two) times daily.   Yes [provider]  OLANZapine (ZYPREXA) 10 MG tablet Take 10 mg by mouth at bedtime.   Yes  [provider]  OLANZapine (ZYPREXA) 20 MG tablet Take 1 tablet (20 mg total) by mouth at bedtime. 12/31/18 03/01/19 Yes Kyle, Tyrone A, DO  Valbenazine Tosylate (INGREZZA) 80 MG CAPS Take 1 tablet by mouth at bedtime.    Yes [provider]      Allergies  Allergen Reactions  . Penicillins Other (See Comments)    Child hood reaction Has patient had a PCN reaction causing immediate rash, facial/tongue/throat swelling, SOB or lightheadedness with hypotension: Yes Has patient had a PCN reaction causing severe rash involving mucus membranes or skin necrosis: No Has patient had a PCN reaction that required hospitalization No Has patient had a PCN reaction occurring within the last 10 years: No If all  of the above answers are "NO", then may proceed with C    ROS:  Out of a complete 14 system review of symptoms, the patient complains only of the following symptoms, and all other reviewed systems are negative.  Walking difficulty Slurring of speech Confusion  Blood pressure (!) 148/88, pulse 79, temperature 98.4 F (36.9 C), temperature source Temporal, height 5\' 5"  (1.651 m), weight 134 lb (60.8 kg).  Physical Exam  General: The patient is alert and cooperative at the time of the examination.  Eyes: Pupils are equal, round, and reactive to light. Discs are flat bilaterally.  Neck: The neck is supple, no carotid bruits are noted.  Respiratory: The respiratory examination is clear.  Cardiovascular: The cardiovascular examination reveals a regular rate and rhythm, no obvious murmurs or rubs are noted.  Skin: Extremities are without significant edema.  Neurologic Exam  Mental status: The patient is alert and oriented x 3 at the time of the examination. The patient has apparent normal recent and remote memory, with an apparently normal attention span and concentration ability.  Cranial nerves: Facial symmetry is present. There is good sensation of the face to pinprick and soft touch bilaterally. The strength of the facial muscles and the muscles to head turning and shoulder shrug are normal bilaterally. Speech is slightly dysarthric, has a gurgling wet quality, no aphasia.Marland Kitchen Extraocular movements are full. Visual fields are full. The tongue is midline, and the patient has symmetric elevation of the soft palate. No obvious hearing deficits are noted.  Masking of the face is seen.  Motor: The motor testing reveals 5 over 5 strength of all 4 extremities.  The patient has difficulty giving full effort with lower extremities, there appears to be some degree of apraxia.  Good symmetric motor tone is noted throughout.  Sensory: Sensory testing is intact to pinprick, soft touch, vibration  sensation, and position sense on all 4 extremities.  The patient at times it seems to perseverate with responses.  No evidence of extinction is noted.  Coordination: Cerebellar testing reveals good finger-nose-finger and heel-to-shin bilaterally.  No asterixis was seen.  There does appear to be apraxia with trying to perform heel-to-shin bilaterally.  Gait and station: Gait is associated with almost a limping type quality on the right leg, she does tend to shuffle slightly, arms are flexed and not swinging.  Tandem gait is normal. Romberg is negative. No drift is seen.  Reflexes: Deep tendon reflexes are symmetric and normal bilaterally.  Ankle jerk reflexes are maintained bilaterally.  Toes are downgoing bilaterally.   MRI brain 12/25/18:  IMPRESSION: 1. No acute intracranial abnormality. Stable and negative for age non-contrast MRI appearance of the brain.  2. Evidence of chronic right side eustachian tube dysfunction in the  setting of multiple bilateral adenoid cysts, probably benign retention cysts. Recommend follow-up with ENT.  * MRI scan images were reviewed online. I agree with the written report.    Assessment/Plan:  1.  Encephalopathy  2.  Gait disturbance  3.  History of schizophrenia  The patient has had a recent addition and increase of the Ingrezza, she went from 40 mg daily starting December 2019 and was increased to 80 mg daily sometime in February 2020.  By April 2020, the patient was having alteration in walking and some mental status changes by August 2020.  The patient appears to have some parkinsonian features at this time, there is no evidence of tardive dyskinesia.  I have recommended that the Ingrezza dose be reduced to 40 mg daily, if no improvement in her clinical syndrome over the next month is noted, I would recommend reduction in the lithium dosing as well.  The patient will follow-up here in 3 months.  Jill Alexanders MD 02/10/2019 2:22 PM  [facility] [address] [place], [place] [address]  Phone [phone] Fax [phone]

## 2019-02-13 ENCOUNTER — Telehealth: Payer: Self-pay | Admitting: Neurology

## 2019-02-13 LAB — ENA+DNA/DS+SJORGEN'S
ENA RNP Ab: >8 AI — ABNORMAL HIGH (ref 0.0–0.9)
ENA SM Ab Ser-aCnc: <0.2 AI (ref 0.0–0.9)
ENA SSA (RO) Ab: <0.2 AI (ref 0.0–0.9)
ENA SSB (LA) Ab: <0.2 AI (ref 0.0–0.9)
dsDNA Ab: 1 IU/mL (ref 0–9)

## 2019-02-13 LAB — COMPREHENSIVE METABOLIC PANEL
ALT: 25 IU/L (ref 0–32)
AST: 25 IU/L (ref 0–40)
Albumin/Globulin Ratio: 1.2 (ref 1.2–2.2)
Albumin: 4.3 g/dL (ref 3.8–4.8)
Alkaline Phosphatase: 85 IU/L (ref 39–117)
BUN/Creatinine Ratio: 11 — ABNORMAL LOW (ref 12–28)
BUN: 8 mg/dL (ref 8–27)
Bilirubin Total: 0.4 mg/dL (ref 0.0–1.2)
CO2: 21 mmol/L (ref 20–29)
Calcium: 10.2 mg/dL (ref 8.7–10.3)
Chloride: 104 mmol/L (ref 96–106)
Creatinine, Ser: 0.74 mg/dL (ref 0.57–1.00)
GFR calc Af Amer: 100 mL/min/{1.73_m2} (ref >59)
GFR calc non Af Amer: 87 mL/min/{1.73_m2} (ref >59)
Globulin, Total: 3.7 g/dL (ref 1.5–4.5)
Glucose: 100 mg/dL — ABNORMAL HIGH (ref 65–99)
Potassium: 4.1 mmol/L (ref 3.5–5.2)
Sodium: 136 mmol/L (ref 134–144)
Total Protein: 8 g/dL (ref 6.0–8.5)

## 2019-02-13 LAB — CBC WITH DIFFERENTIAL/PLATELET
Basophils Absolute: 0 10*3/uL (ref 0.0–0.2)
Basos: 0 %
EOS (ABSOLUTE): 0.4 10*3/uL (ref 0.0–0.4)
Eos: 5 %
Hematocrit: 39.4 % (ref 34.0–46.6)
Hemoglobin: 12.4 g/dL (ref 11.1–15.9)
Immature Grans (Abs): 0 10*3/uL (ref 0.0–0.1)
Immature Granulocytes: 0 %
Lymphocytes Absolute: 2.5 10*3/uL (ref 0.7–3.1)
Lymphs: 30 %
MCH: 23 pg — ABNORMAL LOW (ref 26.6–33.0)
MCHC: 31.5 g/dL (ref 31.5–35.7)
MCV: 73 fL — ABNORMAL LOW (ref 79–97)
Monocytes Absolute: 0.5 10*3/uL (ref 0.1–0.9)
Monocytes: 6 %
Neutrophils Absolute: 4.8 10*3/uL (ref 1.4–7.0)
Neutrophils: 59 %
Platelets: 311 10*3/uL (ref 150–450)
RBC: 5.4 x10E6/uL — ABNORMAL HIGH (ref 3.77–5.28)
RDW: 15.3 % (ref 11.7–15.4)
WBC: 8.2 10*3/uL (ref 3.4–10.8)

## 2019-02-13 LAB — AMMONIA: Ammonia: 43 ug/dL (ref 34–178)

## 2019-02-13 LAB — COPPER, SERUM: Copper: 161 ug/dL (ref 72–166)

## 2019-02-13 LAB — ANA W/REFLEX: Anti Nuclear Antibody (ANA): POSITIVE — AB

## 2019-02-13 LAB — B. BURGDORFI ANTIBODIES: Lyme IgG/IgM Ab: <0.91 {ISR} (ref 0.00–0.90)

## 2019-02-13 LAB — SEDIMENTATION RATE: Sed Rate: 33 mm/hr (ref 0–40)

## 2019-02-13 LAB — RPR: RPR Ser Ql: NONREACTIVE

## 2019-02-13 LAB — VITAMIN B12: Vitamin B-12: 771 pg/mL (ref 232–1245)

## 2019-02-13 NOTE — Telephone Encounter (Signed)
Blood work is unremarkable exception of a positive ANA, the antibody panel shows an elevation in the ENA-RNP antibody, this may be associated with mixed connective tissue disease or lupus, in isolation, not clear what the clinical significance is, we will follow the patient clinically.  If medication adjustments do not improve her clinical symptoms, may consider rheumatology evaluation.  I called and discussed this with the patient.

## 2019-02-16 ENCOUNTER — Emergency Department (HOSPITAL_COMMUNITY): Payer: Medicaid Other

## 2019-02-16 ENCOUNTER — Other Ambulatory Visit: Payer: Self-pay

## 2019-02-16 ENCOUNTER — Encounter (HOSPITAL_COMMUNITY): Payer: Self-pay

## 2019-02-16 ENCOUNTER — Inpatient Hospital Stay (HOSPITAL_COMMUNITY)
Admission: EM | Admit: 2019-02-16 | Discharge: 2019-02-17 | DRG: 918 | Disposition: A | Discharge status: Home / Self Care | Payer: Medicaid Other | Attending: Family Medicine | Admitting: Family Medicine

## 2019-02-16 DIAGNOSIS — R2689 Other abnormalities of gait and mobility: Secondary | ICD-10-CM | POA: Diagnosis present

## 2019-02-16 DIAGNOSIS — Z79899 Other long term (current) drug therapy: Secondary | ICD-10-CM | POA: Diagnosis not present

## 2019-02-16 DIAGNOSIS — R519 Headache, unspecified: Secondary | ICD-10-CM | POA: Diagnosis present

## 2019-02-16 DIAGNOSIS — F17210 Nicotine dependence, cigarettes, uncomplicated: Secondary | ICD-10-CM | POA: Diagnosis present

## 2019-02-16 DIAGNOSIS — E875 Hyperkalemia: Secondary | ICD-10-CM | POA: Diagnosis present

## 2019-02-16 DIAGNOSIS — Z20828 Contact with and (suspected) exposure to other viral communicable diseases: Secondary | ICD-10-CM | POA: Diagnosis present

## 2019-02-16 DIAGNOSIS — M797 Fibromyalgia: Secondary | ICD-10-CM | POA: Diagnosis present

## 2019-02-16 DIAGNOSIS — R2681 Unsteadiness on feet: Secondary | ICD-10-CM

## 2019-02-16 DIAGNOSIS — K219 Gastro-esophageal reflux disease without esophagitis: Secondary | ICD-10-CM | POA: Diagnosis present

## 2019-02-16 DIAGNOSIS — T56891A Toxic effect of other metals, accidental (unintentional), initial encounter: Secondary | ICD-10-CM | POA: Diagnosis not present

## 2019-02-16 DIAGNOSIS — R296 Repeated falls: Secondary | ICD-10-CM | POA: Diagnosis present

## 2019-02-16 DIAGNOSIS — F319 Bipolar disorder, unspecified: Secondary | ICD-10-CM | POA: Diagnosis present

## 2019-02-16 DIAGNOSIS — Z880 Allergy status to penicillin: Secondary | ICD-10-CM | POA: Diagnosis not present

## 2019-02-16 DIAGNOSIS — J45909 Unspecified asthma, uncomplicated: Secondary | ICD-10-CM | POA: Diagnosis present

## 2019-02-16 DIAGNOSIS — F209 Schizophrenia, unspecified: Secondary | ICD-10-CM | POA: Diagnosis present

## 2019-02-16 DIAGNOSIS — T43591A Poisoning by other antipsychotics and neuroleptics, accidental (unintentional), initial encounter: Secondary | ICD-10-CM | POA: Diagnosis not present

## 2019-02-16 LAB — LITHIUM LEVEL: Lithium Lvl: 1.64 mmol/L (ref 0.60–1.20)

## 2019-02-16 LAB — CBC WITH DIFFERENTIAL/PLATELET
Abs Immature Granulocytes: 0.03 10*3/uL (ref 0.00–0.07)
Basophils Absolute: 0 10*3/uL (ref 0.0–0.1)
Basophils Relative: 0 %
Eosinophils Absolute: 0.9 10*3/uL — ABNORMAL HIGH (ref 0.0–0.5)
Eosinophils Relative: 11 %
HCT: 40.7 % (ref 36.0–46.0)
Hemoglobin: 12.1 g/dL (ref 12.0–15.0)
Immature Granulocytes: 0 %
Lymphocytes Relative: 30 %
Lymphs Abs: 2.2 10*3/uL (ref 0.7–4.0)
MCH: 23.3 pg — ABNORMAL LOW (ref 26.0–34.0)
MCHC: 29.7 g/dL — ABNORMAL LOW (ref 30.0–36.0)
MCV: 78.3 fL — ABNORMAL LOW (ref 80.0–100.0)
Monocytes Absolute: 0.6 10*3/uL (ref 0.1–1.0)
Monocytes Relative: 7 %
Neutro Abs: 3.9 10*3/uL (ref 1.7–7.7)
Neutrophils Relative %: 52 %
Platelets: 353 10*3/uL (ref 150–400)
RBC: 5.2 MIL/uL — ABNORMAL HIGH (ref 3.87–5.11)
RDW: 15.9 % — ABNORMAL HIGH (ref 11.5–15.5)
WBC: 7.6 10*3/uL (ref 4.0–10.5)
nRBC: 0 % (ref 0.0–0.2)

## 2019-02-16 LAB — COMPREHENSIVE METABOLIC PANEL
ALT: 23 U/L (ref 0–44)
AST: 34 U/L (ref 15–41)
Albumin: 3.4 g/dL — ABNORMAL LOW (ref 3.5–5.0)
Alkaline Phosphatase: 69 U/L (ref 38–126)
Anion gap: 7 (ref 5–15)
BUN: 11 mg/dL (ref 8–23)
CO2: 23 mmol/L (ref 22–32)
Calcium: 10.2 mg/dL (ref 8.9–10.3)
Chloride: 105 mmol/L (ref 98–111)
Creatinine, Ser: 0.85 mg/dL (ref 0.44–1.00)
GFR calc Af Amer: >60 mL/min (ref >60)
GFR calc non Af Amer: >60 mL/min (ref >60)
Glucose, Bld: 93 mg/dL (ref 70–99)
Potassium: 5.6 mmol/L — ABNORMAL HIGH (ref 3.5–5.1)
Sodium: 135 mmol/L (ref 135–145)
Total Bilirubin: 0.7 mg/dL (ref 0.3–1.2)
Total Protein: 7.2 g/dL (ref 6.5–8.1)

## 2019-02-16 LAB — ETHANOL: Alcohol, Ethyl (B): <10 mg/dL (ref <10)

## 2019-02-16 LAB — URINALYSIS, ROUTINE W REFLEX MICROSCOPIC
Bilirubin Urine: NEGATIVE
Glucose, UA: NEGATIVE mg/dL
Hgb urine dipstick: NEGATIVE
Ketones, ur: NEGATIVE mg/dL
Leukocytes,Ua: NEGATIVE
Nitrite: NEGATIVE
Protein, ur: NEGATIVE mg/dL
Specific Gravity, Urine: 1.014 (ref 1.005–1.030)
pH: 7 (ref 5.0–8.0)

## 2019-02-16 LAB — SARS CORONAVIRUS 2 (TAT 6-24 HRS): SARS Coronavirus 2: NEGATIVE

## 2019-02-16 LAB — RAPID URINE DRUG SCREEN, HOSP PERFORMED
Amphetamines: NOT DETECTED
Barbiturates: NOT DETECTED
Benzodiazepines: NOT DETECTED
Cocaine: NOT DETECTED
Opiates: NOT DETECTED
Tetrahydrocannabinol: NOT DETECTED

## 2019-02-16 LAB — PROTIME-INR
INR: 1 (ref 0.8–1.2)
Prothrombin Time: 12.8 seconds (ref 11.4–15.2)

## 2019-02-16 LAB — TROPONIN I (HIGH SENSITIVITY)
Troponin I (High Sensitivity): 4 ng/L (ref <18)
Troponin I (High Sensitivity): 5 ng/L (ref <18)

## 2019-02-16 LAB — LACTIC ACID, PLASMA: Lactic Acid, Venous: 1.2 mmol/L (ref 0.5–1.9)

## 2019-02-16 LAB — LIPASE, BLOOD: Lipase: 34 U/L (ref 11–51)

## 2019-02-16 MED ORDER — ALBUTEROL SULFATE (2.5 MG/3ML) 0.083% IN NEBU: 2.5000 mg | INHALATION_SOLUTION | Freq: Four times a day (QID) | RESPIRATORY_TRACT | Status: DC | PRN

## 2019-02-16 MED ORDER — HYDROXYZINE HCL 25 MG PO TABS
50.0000 mg | ORAL_TABLET | Freq: Every day | ORAL | Status: DC
  Administered 2019-02-16: 50 mg via ORAL
  Filled 2019-02-16: qty 2

## 2019-02-16 MED ORDER — ENOXAPARIN SODIUM 40 MG/0.4ML ~~LOC~~ SOLN
40.0000 mg | SUBCUTANEOUS | Status: DC
  Administered 2019-02-16 – 2019-02-17 (×2): 40 mg via SUBCUTANEOUS
  Filled 2019-02-16 (×2): qty 0.4

## 2019-02-16 MED ORDER — OLANZAPINE 10 MG PO TABS
20.0000 mg | ORAL_TABLET | Freq: Every day | ORAL | Status: DC
  Administered 2019-02-16: 20 mg via ORAL
  Filled 2019-02-16 (×2): qty 2

## 2019-02-16 MED ORDER — ACETAMINOPHEN 325 MG PO TABS
650.0000 mg | ORAL_TABLET | Freq: Four times a day (QID) | ORAL | Status: DC | PRN
  Administered 2019-02-17 (×2): 650 mg via ORAL
  Filled 2019-02-16 (×2): qty 2

## 2019-02-16 MED ORDER — SODIUM CHLORIDE 0.9% FLUSH
3.0000 mL | Freq: Two times a day (BID) | INTRAVENOUS | Status: DC
  Administered 2019-02-16 (×2): 3 mL via INTRAVENOUS

## 2019-02-16 MED ORDER — ACETAMINOPHEN 650 MG RE SUPP: 650.0000 mg | Freq: Four times a day (QID) | RECTAL | Status: DC | PRN

## 2019-02-16 MED ORDER — SODIUM CHLORIDE 0.9 % IV BOLUS (SEPSIS)
500.0000 mL | Freq: Once | INTRAVENOUS | Status: AC
  Administered 2019-02-16: 500 mL via INTRAVENOUS

## 2019-02-16 MED ORDER — ONDANSETRON HCL 4 MG/2ML IJ SOLN: 4.0000 mg | Freq: Four times a day (QID) | INTRAMUSCULAR | Status: DC | PRN

## 2019-02-16 MED ORDER — SODIUM CHLORIDE 0.9 % IV SOLN
1000.0000 mL | INTRAVENOUS | Status: DC
  Administered 2019-02-16: 1000 mL via INTRAVENOUS

## 2019-02-16 MED ORDER — FAMOTIDINE 20 MG PO TABS
40.0000 mg | ORAL_TABLET | Freq: Two times a day (BID) | ORAL | Status: DC
  Administered 2019-02-16 – 2019-02-17 (×2): 40 mg via ORAL
  Filled 2019-02-16 (×2): qty 2

## 2019-02-16 MED ORDER — OLANZAPINE 10 MG PO TABS
10.0000 mg | ORAL_TABLET | Freq: Every day | ORAL | Status: DC
  Administered 2019-02-17: 10 mg via ORAL
  Filled 2019-02-16: qty 1

## 2019-02-16 MED ORDER — ONDANSETRON HCL 4 MG PO TABS: 4.0000 mg | ORAL_TABLET | Freq: Four times a day (QID) | ORAL | Status: DC | PRN

## 2019-02-16 MED ORDER — VALBENAZINE TOSYLATE 80 MG PO CAPS: 1.0000 | ORAL_CAPSULE | Freq: Every day | ORAL | Status: DC

## 2019-02-16 MED ORDER — SODIUM CHLORIDE 0.9 % IV BOLUS
1000.0000 mL | Freq: Once | INTRAVENOUS | Status: AC
  Administered 2019-02-16: 1000 mL via INTRAVENOUS

## 2019-02-16 NOTE — ED Triage Notes (Signed)
To room via EMS.  Onset this morning after eating cereal, nausea and dizziness.  No c/o dizziness at this time.  Pt seen last week for dizziness but is unable to remember what neurologist said.  Pt reports has had several falls recently.

## 2019-02-16 NOTE — H&P (Addendum)
History and Physical    Karen Best [id number] DOB: 1955/08/23 DOA: 02/16/2019  Referring MD/NP/PA: Jeannie Done. Vallery Ridge, MD PCP: [facility]  Patient coming from: Via EMS  Chief Complaint: Weakness  I have personally briefly reviewed patient's old medical records in [place]   HPI: Karen Best is a 63 y.o. female with medical history significant of bipolar 1 disorder, schizophrenia, asthma, fibromyalgia, dementia, and tobacco abuse.  She presents with complaints generalized weakness, dizziness, headaches, nausea, malaise, tremor, and several falls for the last 2 weeks.  Denies having any significant suicidal ideations, cough, fever, recent sick contacts, vomiting, chest pain, urinary frequency, dysuria, or diarrhea.  She denies any trauma to her head or loss of consciousness.  She reports that Karen Best is trying to kill her.  She reports that her lithium dose was recently possibly increased of days ago.  Patient had just recently been hospitalized in August for confusion thought to be related to patient's schizophrenia.  She was discharged home skilled nursing facility due to inability to care for self home.  ED Course: Patient was noted to be afebrile and hemodynamically stable.  CT scan of the brain negative for any acute abnormalities.  Labs significant for potassium 5.6 and lithium level 1.64.  Patient was ordered 500 mL normal saline IV fluid bolus, and then placed on a rate 125 mL/h.  Review of systems: A complete 10 point review of systems preformed an negative except for as noted above in the HPI.  Past Medical History:  Diagnosis Date  . Asthma   . Bipolar 1 disorder ([place])   . Fibromyalgia   . Schizophrenia ([place])   . Tobacco abuse     Past Surgical History:  Procedure Laterality Date  . CESAREAN SECTION    . TUBAL LIGATION       reports that she has been smoking cigarettes. She has been smoking about 0.50 packs per day. She  has never used smokeless tobacco. She reports current alcohol use. She reports current drug use. Drug: Cocaine.  Allergies  Allergen Reactions  . Penicillins Other (See Comments)    Child hood reaction Has patient had a PCN reaction causing immediate rash, facial/tongue/throat swelling, SOB or lightheadedness with hypotension: Yes Has patient had a PCN reaction causing severe rash involving mucus membranes or skin necrosis: No Has patient had a PCN reaction that required hospitalization No Has patient had a PCN reaction occurring within the last 10 years: No If all of the above answers are "NO", then may proceed with C    Family History  Problem Relation Age of Onset  . Liver cancer Neg Hx   . Liver disease Neg Hx     Prior to Admission medications   Medication Sig Start Date End Date Taking? Authorizing Provider  acetaminophen (TYLENOL) 325 MG tablet Take 650 mg by mouth every 6 (six) hours as needed for mild pain or moderate pain.    [provider]  cetirizine (ZYRTEC) 10 MG tablet Take 10 mg by mouth daily.    [provider]  famotidine (PEPCID) 40 MG tablet Take 40 mg by mouth 2 (two) times daily.    [provider]  hydrOXYzine (ATARAX/VISTARIL) 50 MG tablet Take 50 mg by mouth at bedtime.    [provider]  ibuprofen (ADVIL) 200 MG tablet Take 400 mg by mouth every 6 (six) hours as needed for headache or mild pain.    [provider]  lithium carbonate (ESKALITH) 450 MG CR tablet Take 450 mg by mouth 2 (two) times daily.    [provider]  OLANZapine (ZYPREXA) 10 MG tablet Take 10 mg by mouth at bedtime.    [provider]  OLANZapine (ZYPREXA) 20 MG tablet Take 1 tablet (20 mg total) by mouth at bedtime. 12/31/18 03/01/19  Cherylann Ratel A, DO  Valbenazine Tosylate (INGREZZA) 80 MG CAPS Take 1 tablet by mouth at bedtime.     [provider]    Physical Exam:  Constitutional: Elderly female in NAD, calm,  comfortable Vitals:   02/16/19 1143  BP: (!) 154/82  Pulse: 67  Resp: 16  Temp: 98.6 F (37 C)  TempSrc: Oral  SpO2: 100%   Eyes: PERRL, lids and conjunctivae normal ENMT: Mucous membranes are moist. Posterior pharynx clear of any exudate or lesions.Normal dentition.  Neck: normal, supple, no masses, no thyromegaly Respiratory: clear to auscultation bilaterally, no wheezing, no crackles. Normal respiratory effort. No accessory muscle use.  Cardiovascular: Regular rate and rhythm, no murmurs / rubs / gallops. No extremity edema. 2+ pedal pulses. No carotid bruits.  Abdomen: no tenderness, no masses palpated. No hepatosplenomegaly. Bowel sounds positive.  Musculoskeletal: no clubbing / cyanosis. No joint deformity upper and lower extremities. Good ROM, no contractures. Normal muscle tone.  Skin: no rashes, lesions, ulcers. No induration Neurologic: CN 2-12 grossly intact. Sensation intact, DTR normal. Strength 5/5 in all 4.  Tremor present. Psychiatric: Normal judgment and insight. Alert and oriented x 3. Normal mood.     Labs on Admission: I have personally reviewed following labs and imaging studies  CBC: Recent Labs  Lab 02/10/19 1500 02/16/19 1233  WBC 8.2 7.6  NEUTROABS 4.8 3.9  HGB 12.4 12.1  HCT 39.4 40.7  MCV 73* 78.3*  PLT 311 0000000   Basic Metabolic Panel: Recent Labs  Lab 02/10/19 1500 02/16/19 1233  NA 136 135  K 4.1 5.6*  CL 104 105  CO2 21 23  GLUCOSE 100* 93  BUN 8 11  CREATININE 0.74 0.85  CALCIUM 10.2 10.2   GFR: Estimated Creatinine Clearance: 61.8 mL/min (by C-G formula based on SCr of 0.85 mg/dL). Liver Function Tests: Recent Labs  Lab 02/10/19 1500 02/16/19 1233  AST 25 34  ALT 25 23  ALKPHOS 85 69  BILITOT 0.4 0.7  PROT 8.0 7.2  ALBUMIN 4.3 3.4*   Recent Labs  Lab 02/16/19 1233  LIPASE 34   Recent Labs  Lab 02/10/19 1500  AMMONIA 43   Coagulation Profile: Recent Labs  Lab 02/16/19 1233  INR 1.0   Cardiac Enzymes: No  results for input(s): CKTOTAL, CKMB, CKMBINDEX, TROPONINI in the last 168 hours. BNP (last 3 results) No results for input(s): PROBNP in the last 8760 hours. HbA1C: No results for input(s): HGBA1C in the last 72 hours. CBG: No results for input(s): GLUCAP in the last 168 hours. Lipid Profile: No results for input(s): CHOL, HDL, LDLCALC, TRIG, CHOLHDL, LDLDIRECT in the last 72 hours. Thyroid Function Tests: No results for input(s): TSH, T4TOTAL, FREET4, T3FREE, THYROIDAB in the last 72 hours. Anemia Panel: No results for input(s): VITAMINB12, FOLATE, FERRITIN, TIBC, IRON, RETICCTPCT in the last 72 hours. Urine analysis:    Component Value Date/Time   COLORURINE YELLOW 02/16/2019 1230   APPEARANCEUR HAZY (A) 02/16/2019 1230   LABSPEC 1.014 02/16/2019 1230   PHURINE 7.0 02/16/2019 1230   GLUCOSEU NEGATIVE 02/16/2019 1230   HGBUR NEGATIVE 02/16/2019 [place] 02/16/2019 1230  KETONESUR NEGATIVE 02/16/2019 [place] 02/16/2019 1230   UROBILINOGEN 0.2 10/26/2018 1355   NITRITE NEGATIVE 02/16/2019 [place] 02/16/2019 1230   Sepsis Labs: No results found for this or any previous visit (from the past 240 hour(s)).   Radiological Exams on Admission: Dg Chest 2 View  Result Date: 02/16/2019 CLINICAL DATA:  Shortness of breath EXAM: CHEST - 2 VIEW COMPARISON:  02/07/2019 FINDINGS: Cardiac shadows within normal limits. The lungs are well aerated bilaterally. No focal infiltrate or sizable effusion is seen. No bony abnormality is noted. IMPRESSION: No acute abnormality seen. Electronically Signed   By: Inez Catalina M.D.   On: 02/16/2019 12:23   Ct Head Wo Contrast  Result Date: 02/16/2019 CLINICAL DATA:  Altered mental status.  Loss of consciousness. EXAM: CT HEAD WITHOUT CONTRAST TECHNIQUE: Contiguous axial images were obtained from the base of the skull through the vertex without intravenous contrast. COMPARISON:  Brain MRI  12/25/2018.  Head CT 12/23/2018. FINDINGS: Brain: No evidence of acute infarction, hemorrhage, hydrocephalus, extra-axial collection or mass lesion/mass effect. Vascular: No hyperdense vessel or unexpected calcification. Skull: Intact.  No focal lesion. Sinuses/Orbits: Negative. Other: None. IMPRESSION: Negative head CT. Electronically Signed   By: Inge Rise M.D.   On: 02/16/2019 12:26    EKG: Independently reviewed. Sinus rhythm at 62 bpm with minimal ST elevation noted  Assessment/Plan Lithium toxicity: Acute.  Patient presents with complaints of malaise, headaches, nausea, and frequent falls.  Lithium level 1.64.  Patient reports recent change in dose.  Therapeutic goal range is 0.8-1.2.  Suspect symptoms due to lithium toxicity.  Patient followed by "Karen Best" numbers given to try and contact include [phone], [phone], and [phone]. -Admit to a medical telemetry bed -Bolus additional 1 L normal saline IV fluids, then placed on a rate of 125 ml/h -Hold lithium -Recheck lithium levels in a.m.   -PT to evaluate and treat -Retry to contact Karen Best possibly in a.m. regarding current lithium regimen.  Hyperkalemia: Initial potassium elevated at 5.6.  Suspect related with toxicity. -Recheck potassium levels in a.m.  Bipolar 1 disorder, schizophrenia:  -Continue olanzapine and Valbenazine  Elevated ANA with ENA-RNP antibody: Recently appreciated on work-up by  Dr. Jannifer Franklin neurology.  Questioned association with possible mixed connective tissue disease or lupus.  COVID-19 screening pending -Follow-up COVID-19 results  GERD -Continue that  DVT prophylaxis: None Code Status: Full Family Communication: No family requested to be updated Disposition Plan: Possible discharge home in 1 to 2 days Consults called: None Admission status: observation  Norval Morton MD [facility] Pager [phone]   If 7PM-7AM, please contact night-coverage [email or web address]  Password [email or web address]  02/16/2019, 2:02 PM

## 2019-02-16 NOTE — ED Notes (Signed)
Ordered patient a regular meal tray per pa Fawze--Lateya Dauria

## 2019-02-16 NOTE — ED Notes (Signed)
Date and time results received: 02/16/19 1336 (use smartphrase ".now" to insert current time)  Test: Lithium Critical Value: 1.64  Name of Provider Notified: Dr. Johnney Killian  Orders Received? Or Actions Taken?: No orders at this time given.

## 2019-02-16 NOTE — ED Provider Notes (Signed)
[facility] EMERGENCY DEPARTMENT Provider Note   CSN: [id number] Arrival date & time: 02/16/19  1133     History   Chief Complaint Chief Complaint  Patient presents with  . Nausea    HPI Karen Best is a 63 y.o. female.     HPI Patient reports she has headache and general weakness.  Headache is generalized.  Aching in quality.  Patient denies visual changes.  She reports nausea.  Headache was gradual in onset.  She reports she has felt generally weak for several weeks.  She reports that she fell approximately 3 days ago in her bathroom.  She does not recall if she hit her head.  She does not have focal weakness numbness or dysfunction of an extremity.  No fevers, no cough.  She reports she has felt moderately short of breath generally speaking.  No swelling in the legs.  No lower extremity or calf pain.  Patient reports he is taking her medications as prescribed. Past Medical History:  Diagnosis Date  . Asthma   . Bipolar 1 disorder ([place])   . Fibromyalgia   . Schizophrenia ([place])   . Tobacco abuse     Patient Active Problem List   Diagnosis Date Noted  . Lithium toxicity 02/16/2019  . Acute confusion 12/29/2018  . Toxic metabolic encephalopathy [id number]  . Dysarthria 12/23/2018  . Elevated blood pressure reading 12/23/2018  . Asthma 12/23/2018  . Delirium due to another medical condition   . UTI (urinary tract infection) 12/06/2018  . Acute metabolic encephalopathy [id number]  . 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 Psychosis ([place]) 09/16/2013  . Schizophrenia [facility])     Past Surgical History:  Procedure Laterality Date  . CESAREAN SECTION    . TUBAL LIGATION       OB History   No obstetric history on file.      Home Medications    Prior to Admission medications   Medication Sig Start Date End Date Taking? Authorizing Provider  acetaminophen (TYLENOL) 325 MG tablet Take 650 mg by mouth every 6 (six) hours as needed for  mild pain or moderate pain.    [provider]  cetirizine (ZYRTEC) 10 MG tablet Take 10 mg by mouth daily.    [provider]  famotidine (PEPCID) 40 MG tablet Take 40 mg by mouth 2 (two) times daily.    [provider]  hydrOXYzine (ATARAX/VISTARIL) 50 MG tablet Take 50 mg by mouth at bedtime.    [provider]  ibuprofen (ADVIL) 200 MG tablet Take 400 mg by mouth every 6 (six) hours as needed for headache or mild pain.    [provider]  lithium carbonate (ESKALITH) 450 MG CR tablet Take 450 mg by mouth 2 (two) times daily.    [provider]  OLANZapine (ZYPREXA) 10 MG tablet Take 10 mg by mouth at bedtime.    [provider]  OLANZapine (ZYPREXA) 20 MG tablet Take 1 tablet (20 mg total) by mouth at bedtime. 12/31/18 03/01/19  Cherylann Ratel A, DO  Valbenazine Tosylate (INGREZZA) 80 MG CAPS Take 1 tablet by mouth at bedtime.     [provider]    Family History Family History  Problem Relation Age of Onset  . Liver cancer Neg Hx   . Liver disease Neg Hx     Social History Social History   Tobacco Use  . Smoking status: Current Every Day Smoker    Packs/day: 0.50  Types: Cigarettes  . Smokeless tobacco: Never Used  Substance Use Topics  . Alcohol use: Yes    Comment: occ  . Drug use: Yes    Types: Cocaine    Comment: crack cocaine (last use 10/25/2018)     Allergies   Penicillins   Review of Systems Review of Systems 10 Systems reviewed and are negative for acute change except as noted in the HPI.   Physical Exam Updated Vital Signs BP (!) 118/103   Pulse 61   Temp 98.6 F (37 C) (Oral)   Resp 16   SpO2 100%   Physical Exam Constitutional:      Comments: Patient is alert and nontoxic.  No respiratory distress.  She does appear slightly fatigued.  HENT:     Head: Normocephalic and atraumatic.     Nose: Nose normal.     Mouth/Throat:     Comments: Patient has glossitis.  Posterior  airway is widely patent.  Membranes are moist. Eyes:     Extraocular Movements: Extraocular movements intact.     Pupils: Pupils are equal, round, and reactive to light.  Neck:     Musculoskeletal: Neck supple.  Cardiovascular:     Rate and Rhythm: Normal rate and regular rhythm.  Pulmonary:     Effort: Pulmonary effort is normal.     Breath sounds: Normal breath sounds.  Abdominal:     General: There is no distension.     Palpations: Abdomen is soft.     Tenderness: There is no abdominal tenderness. There is no guarding.  Musculoskeletal: Normal range of motion.        General: No swelling.     Right lower leg: No edema.     Left lower leg: No edema.  Skin:    General: Skin is warm and dry.  Neurological:     General: No focal deficit present.     Cranial Nerves: No cranial nerve deficit.     Coordination: Coordination normal.     Comments: Patient has been able to ambulate in the hall.  She does require some rest breaks and uses the handrail for assistance with balance.  Psychiatric:        Mood and Affect: Mood normal.      ED Treatments / Results  Labs (all labs ordered are listed, but only abnormal results are displayed) Labs Reviewed  COMPREHENSIVE METABOLIC PANEL - Abnormal; Notable for the following components:      Result Value   Potassium 5.6 (*)    Albumin 3.4 (*)    All other components within normal limits  CBC WITH DIFFERENTIAL/PLATELET - Abnormal; Notable for the following components:   RBC 5.20 (*)    MCV 78.3 (*)    MCH 23.3 (*)    MCHC 29.7 (*)    RDW 15.9 (*)    Eosinophils Absolute 0.9 (*)    All other components within normal limits  URINALYSIS, ROUTINE W REFLEX MICROSCOPIC - Abnormal; Notable for the following components:   APPearance HAZY (*)    All other components within normal limits  LITHIUM LEVEL - Abnormal; Notable for the following components:   Lithium Lvl 1.64 (*)    All other components within normal limits  SARS CORONAVIRUS 2  (TAT 6-24 HRS)  ETHANOL  LACTIC ACID, PLASMA  LIPASE, BLOOD  PROTIME-INR  RAPID URINE DRUG SCREEN, HOSP PERFORMED  TROPONIN I (HIGH SENSITIVITY)  TROPONIN I (HIGH SENSITIVITY)    EKG None  Radiology Dg Chest 2  View  Result Date: 02/16/2019 CLINICAL DATA:  Shortness of breath EXAM: CHEST - 2 VIEW COMPARISON:  02/07/2019 FINDINGS: Cardiac shadows within normal limits. The lungs are well aerated bilaterally. No focal infiltrate or sizable effusion is seen. No bony abnormality is noted. IMPRESSION: No acute abnormality seen. Electronically Signed   By: Inez Catalina M.D.   On: 02/16/2019 12:23   Ct Head Wo Contrast  Result Date: 02/16/2019 CLINICAL DATA:  Altered mental status.  Loss of consciousness. EXAM: CT HEAD WITHOUT CONTRAST TECHNIQUE: Contiguous axial images were obtained from the base of the skull through the vertex without intravenous contrast. COMPARISON:  Brain MRI 12/25/2018.  Head CT 12/23/2018. FINDINGS: Brain: No evidence of acute infarction, hemorrhage, hydrocephalus, extra-axial collection or mass lesion/mass effect. Vascular: No hyperdense vessel or unexpected calcification. Skull: Intact.  No focal lesion. Sinuses/Orbits: Negative. Other: None. IMPRESSION: Negative head CT. Electronically Signed   By: Inge Rise M.D.   On: 02/16/2019 12:26    Procedures Procedures (including critical care time) CRITICAL CARE Performed by: Charlesetta Shanks   Total critical care time: 30 minutes  Critical care time was exclusive of separately billable procedures and treating other patients.  Critical care was necessary to treat or prevent imminent or life-threatening deterioration.  Critical care was time spent personally by me on the following activities: development of treatment plan with patient and/or surrogate as well as nursing, discussions with consultants, evaluation of patient's response to treatment, examination of patient, obtaining history from patient or surrogate,  ordering and performing treatments and interventions, ordering and review of laboratory studies, ordering and review of radiographic studies, pulse oximetry and re-evaluation of patient's condition. Medications Ordered in ED Medications  sodium chloride 0.9 % bolus 500 mL (has no administration in time range)    Followed by  0.9 %  sodium chloride infusion (has no administration in time range)  sodium chloride 0.9 % bolus 1,000 mL (has no administration in time range)  enoxaparin (LOVENOX) injection 40 mg (has no administration in time range)  sodium chloride flush (NS) 0.9 % injection 3 mL (has no administration in time range)  acetaminophen (TYLENOL) tablet 650 mg (has no administration in time range)    Or  acetaminophen (TYLENOL) suppository 650 mg (has no administration in time range)  ondansetron (ZOFRAN) tablet 4 mg (has no administration in time range)    Or  ondansetron (ZOFRAN) injection 4 mg (has no administration in time range)  albuterol (PROVENTIL) (2.5 MG/3ML) 0.083% nebulizer solution 2.5 mg (has no administration in time range)     Initial Impression / Assessment and Plan / ED Course  I have reviewed the triage vital signs and the nursing notes.  Pertinent labs & imaging results that were available during my care of the patient were reviewed by me and considered in my medical decision making (see chart for details).       Consult: Reviewed with Dr. Tamala Julian tried hospitalist for admission.  Patient presents with complaint of a generalized headache, feeling generally weak, nauseated and off balance.  She had a fall approximately 3 days ago.  Symptoms are consistent with lithium toxicity.  Patient has mild hyperkalemia but no significant renal insufficiency or AKI at this time.  Will initiate rehydration with normal saline.  Plan for admission.   Final Clinical Impressions(s) / ED Diagnoses   Final diagnoses:  Lithium toxicity, accidental or unintentional, initial  encounter  Gait instability  Generalized headache    ED Discharge Orders    None  Charlesetta Shanks, MD 02/16/19 [phone]

## 2019-02-17 ENCOUNTER — Other Ambulatory Visit: Payer: Self-pay

## 2019-02-17 DIAGNOSIS — T56891A Toxic effect of other metals, accidental (unintentional), initial encounter: Secondary | ICD-10-CM

## 2019-02-17 LAB — BASIC METABOLIC PANEL
Anion gap: 4 — ABNORMAL LOW (ref 5–15)
BUN: 11 mg/dL (ref 8–23)
CO2: 19 mmol/L — ABNORMAL LOW (ref 22–32)
Calcium: 9.1 mg/dL (ref 8.9–10.3)
Chloride: 114 mmol/L — ABNORMAL HIGH (ref 98–111)
Creatinine, Ser: 0.8 mg/dL (ref 0.44–1.00)
GFR calc Af Amer: >60 mL/min (ref >60)
GFR calc non Af Amer: >60 mL/min (ref >60)
Glucose, Bld: 107 mg/dL — ABNORMAL HIGH (ref 70–99)
Potassium: 4 mmol/L (ref 3.5–5.1)
Sodium: 137 mmol/L (ref 135–145)

## 2019-02-17 LAB — CBC
HCT: 38.3 % (ref 36.0–46.0)
Hemoglobin: 11.4 g/dL — ABNORMAL LOW (ref 12.0–15.0)
MCH: 23.7 pg — ABNORMAL LOW (ref 26.0–34.0)
MCHC: 29.8 g/dL — ABNORMAL LOW (ref 30.0–36.0)
MCV: 79.5 fL — ABNORMAL LOW (ref 80.0–100.0)
Platelets: 287 10*3/uL (ref 150–400)
RBC: 4.82 MIL/uL (ref 3.87–5.11)
RDW: 15.9 % — ABNORMAL HIGH (ref 11.5–15.5)
WBC: 8.9 10*3/uL (ref 4.0–10.5)
nRBC: 0 % (ref 0.0–0.2)

## 2019-02-17 LAB — LITHIUM LEVEL: Lithium Lvl: 0.72 mmol/L (ref 0.60–1.20)

## 2019-02-17 MED ORDER — LITHIUM CARBONATE ER 300 MG PO TBCR: 300.0000 mg | EXTENDED_RELEASE_TABLET | Freq: Two times a day (BID) | ORAL | 0 refills | Status: DC

## 2019-02-17 MED ORDER — DOCUSATE SODIUM 100 MG PO CAPS
200.0000 mg | ORAL_CAPSULE | Freq: Every day | ORAL | Status: DC
  Administered 2019-02-17: 200 mg via ORAL
  Filled 2019-02-17: qty 2

## 2019-02-17 NOTE — Plan of Care (Signed)
Poc progressing.  

## 2019-02-17 NOTE — TOC Initial Note (Signed)
Transition of Care [facility]) - Initial/Assessment Note    Patient Details  Name: Karen Best MRN: [id number] Date of Birth: December 14, 1955  Transition of Care [facility]) CM/SW Contact:    Carles Collet, RN Phone Number: 02/17/2019, 3:20 PM  Clinical Narrative:                 Karen Best is a 63 y.o. female with medical history significant of bipolar 1 disorder, schizophrenia, asthma, fibromyalgia, dementia, and tobacco abuse.  She presents with complaints generalized weakness, dizziness, headaches, nausea, malaise, tremor, and several falls for the last 2 weeks  PCP [facility] [place] will continue to follow this high risk for readmission patient.    Expected Discharge Plan: Home/Self Care Barriers to Discharge: Continued Medical Work up   Patient Goals and CMS Choice        Expected Discharge Plan and Services Expected Discharge Plan: Home/Self Care                                              Prior Living Arrangements/Services                       Activities of Daily Living      Permission Sought/Granted                  Emotional Assessment              Admission diagnosis:  Gait instability [R26.81] Generalized headache [R51.9] Lithium toxicity, accidental or unintentional, initial encounter [T56.891A] Patient Active Problem List   Diagnosis Date Noted  . Lithium toxicity 02/16/2019  . Bipolar 1 disorder ([place]) 02/16/2019  . Hyperkalemia 02/16/2019  . Acute confusion 12/29/2018  . Toxic metabolic encephalopathy Q000111Q  . Dysarthria 12/23/2018  . Elevated blood pressure reading 12/23/2018  . Asthma 12/23/2018  . Delirium due to another medical condition   . UTI (urinary tract infection) 12/06/2018  . Acute metabolic encephalopathy [id number]  . 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 Psychosis ([place]) 09/16/2013  . Schizophrenia [facility])    PCP:  [facility], [facility] Pharmacy:    [place], [place] [address] [place] [address] Phone: [phone] Fax: [phone]     Social Determinants of Health (SDOH) Interventions    Readmission Risk Interventions No flowsheet data found.

## 2019-02-17 NOTE — Progress Notes (Signed)
Pt received from ED, AO x4 but forgetful. Breathing even and unlabored in RA. Vitals stable at the time of arrival. CHG bath completed. Connected to tele monitor, CCMD notified. Oriented pt to room and call bell system. Call bell within reach. Will continue to monitor. W

## 2019-02-17 NOTE — ED Notes (Signed)
ED TO INPATIENT HANDOFF REPORT  ED Nurse Name and Phone #:  [phone]  S Name/Age/Gender Karen Best 63 y.o. female Room/Bed: 012C/012C  Code Status   Code Status: Full Code  Home/SNF/Other Skilled nursing facility Patient oriented to: self, place, time and situation Is this baseline? Yes   Triage Complete: Triage complete  Chief Complaint Nausea,Dizzy  Triage Note To room via EMS.  Onset this morning after eating cereal, nausea and dizziness.  No c/o dizziness at this time.  Pt seen last week for dizziness but is unable to remember what neurologist said.  Pt reports has had several falls recently.    Allergies Allergies  Allergen Reactions  . Penicillins Other (See Comments)    Child hood reaction Has patient had a PCN reaction causing immediate rash, facial/tongue/throat swelling, SOB or lightheadedness with hypotension: Yes Has patient had a PCN reaction causing severe rash involving mucus membranes or skin necrosis: No Has patient had a PCN reaction that required hospitalization No Has patient had a PCN reaction occurring within the last 10 years: No If all of the above answers are "NO", then may proceed with C    Level of Care/Admitting Diagnosis ED Disposition    ED Disposition Condition [place]: [place] [[id number]]  Level of Care: Telemetry Medical [104]  Covid Evaluation: Asymptomatic Screening Protocol (No Symptoms)  Diagnosis: Lithium toxicity [id number]  Admitting Physician: Norval Morton [id number]  Attending Physician: Norval Morton [id number]  Estimated length of stay: past midnight tomorrow  Certification:: I certify this patient will need inpatient services for at least 2 midnights  PT Class (Do Not Modify): Inpatient [101]  PT Acc Code (Do Not Modify): Private [1]       B Medical/Surgery History Past Medical History:  Diagnosis Date  . Asthma   . Bipolar 1 disorder ([place])   . Fibromyalgia    . Schizophrenia ([place])   . Tobacco abuse    Past Surgical History:  Procedure Laterality Date  . CESAREAN SECTION    . TUBAL LIGATION       A IV Location/Drains/Wounds Patient Lines/Drains/Airways Status   Active Line/Drains/Airways    Name:   Placement date:   Placement time:   Site:   Days:   Peripheral IV 02/16/19 Right Antecubital   02/16/19    1237    Antecubital   1   Peripheral IV 02/16/19 Right Antecubital   02/16/19    1409    Antecubital   1          Intake/Output Last 24 hours No intake or output data in the 24 hours ending 02/17/19 0000  Labs/Imaging Results for orders placed or performed during the hospital encounter of 02/16/19 (from the past 48 hour(s))  Urine rapid drug screen (hosp performed)     Status: None   Collection Time: 02/16/19 12:20 PM  Result Value Ref Range   Opiates NONE DETECTED NONE DETECTED   Cocaine NONE DETECTED NONE DETECTED   Benzodiazepines NONE DETECTED NONE DETECTED   Amphetamines NONE DETECTED NONE DETECTED   Tetrahydrocannabinol NONE DETECTED NONE DETECTED   Barbiturates NONE DETECTED NONE DETECTED    Comment: (NOTE) DRUG SCREEN FOR MEDICAL PURPOSES ONLY.  IF CONFIRMATION IS NEEDED FOR ANY PURPOSE, NOTIFY LAB WITHIN 5 DAYS. LOWEST DETECTABLE LIMITS FOR URINE DRUG SCREEN Drug Class                     Cutoff (ng/mL)  Amphetamine and metabolites    1000 Barbiturate and metabolites    200 Benzodiazepine                 [id number] Tricyclics and metabolites     300 Opiates and metabolites        300 Cocaine and metabolites        300 THC                            50 Performed at [facility], [place] [address] [place], [place] [address]   Urinalysis, Routine w reflex microscopic     Status: Abnormal   Collection Time: 02/16/19 12:30 PM  Result Value Ref Range   Color, Urine YELLOW YELLOW   APPearance HAZY (A) CLEAR   Specific Gravity, Urine 1.014 1.005 - 1.030   pH 7.0 5.0 - 8.0   Glucose, UA NEGATIVE NEGATIVE mg/dL    Hgb urine dipstick NEGATIVE NEGATIVE   Bilirubin Urine NEGATIVE NEGATIVE   Ketones, ur NEGATIVE NEGATIVE mg/dL   Protein, ur NEGATIVE NEGATIVE mg/dL   Nitrite NEGATIVE NEGATIVE   Leukocytes,Ua NEGATIVE NEGATIVE    Comment: Performed at [facility] [address], [place], [place] [address]  Comprehensive metabolic panel     Status: Abnormal   Collection Time: 02/16/19 12:33 PM  Result Value Ref Range   Sodium 135 135 - 145 mmol/L   Potassium 5.6 (H) 3.5 - 5.1 mmol/L   Chloride 105 98 - 111 mmol/L   CO2 23 22 - 32 mmol/L   Glucose, Bld 93 70 - 99 mg/dL   BUN 11 8 - 23 mg/dL   Creatinine, Ser 0.85 0.44 - 1.00 mg/dL   Calcium 10.2 8.9 - 10.3 mg/dL   Total Protein 7.2 6.5 - 8.1 g/dL   Albumin 3.4 (L) 3.5 - 5.0 g/dL   AST 34 15 - 41 U/L   ALT 23 0 - 44 U/L   Alkaline Phosphatase 69 38 - 126 U/L   Total Bilirubin 0.7 0.3 - 1.2 mg/dL   GFR calc non Af Amer >60 >60 mL/min   GFR calc Af Amer >60 >60 mL/min   Anion gap 7 5 - 15    Comment: Performed at [facility], [place] [address], [place], [place] [address]  Ethanol     Status: None   Collection Time: 02/16/19 12:33 PM  Result Value Ref Range   Alcohol, Ethyl (B) <10 <10 mg/dL    Comment: (NOTE) Lowest detectable limit for serum alcohol is 10 mg/dL. For medical purposes only. Performed at [facility], [place] [address] [place],  [address]   Troponin I (High Sensitivity)     Status: None   Collection Time: 02/16/19 12:33 PM  Result Value Ref Range   Troponin I (High Sensitivity) 4 <18 ng/L    Comment: (NOTE) Elevated high sensitivity troponin I (hsTnI) values and significant  changes across serial measurements may suggest ACS but many other  chronic and acute conditions are known to elevate hsTnI results.  Refer to the "Links" section for chest pain algorithms and additional  guidance. Performed at [facility], [place] [address], [place], [place] [address]   Lactic acid, plasma     Status: None    Collection Time: 02/16/19 12:33 PM  Result Value Ref Range   Lactic Acid, Venous 1.2 0.5 - 1.9 mmol/L    Comment: Performed at [facility]  [facility], [place] [address], [place], [place] [address]  Lipase, blood     Status: None   Collection Time: 02/16/19 12:33 PM  Result Value Ref Range   Lipase 34 11 - 51 U/L    Comment: Performed at [facility] [address] [place], [place] [address]  CBC with Differential     Status: Abnormal   Collection Time: 02/16/19 12:33 PM  Result Value Ref Range   WBC 7.6 4.0 - 10.5 K/uL   RBC 5.20 (H) 3.87 - 5.11 MIL/uL   Hemoglobin 12.1 12.0 - 15.0 g/dL   HCT 40.7 36.0 - 46.0 %   MCV 78.3 (L) 80.0 - 100.0 fL   MCH 23.3 (L) 26.0 - 34.0 pg   MCHC 29.7 (L) 30.0 - 36.0 g/dL   RDW 15.9 (H) 11.5 - 15.5 %   Platelets 353 150 - 400 K/uL   nRBC 0.0 0.0 - 0.2 %   Neutrophils Relative % 52 %   Neutro Abs 3.9 1.7 - 7.7 K/uL   Lymphocytes Relative 30 %   Lymphs Abs 2.2 0.7 - 4.0 K/uL   Monocytes Relative 7 %   Monocytes Absolute 0.6 0.1 - 1.0 K/uL   Eosinophils Relative 11 %   Eosinophils Absolute 0.9 (H) 0.0 - 0.5 K/uL   Basophils Relative 0 %   Basophils Absolute 0.0 0.0 - 0.1 K/uL   Immature Granulocytes 0 %   Abs Immature Granulocytes 0.03 0.00 - 0.07 K/uL    Comment: Performed at [facility], [place] [address] [place], [place] [address]  Protime-INR     Status: None   Collection Time: 02/16/19 12:33 PM  Result Value Ref Range   Prothrombin Time 12.8 11.4 - 15.2 seconds   INR 1.0 0.8 - 1.2    Comment: (NOTE) INR goal varies based on device and disease states. Performed at [facility], [place] [address] [place], [place] [address]   Lithium level     Status: Abnormal   Collection Time: 02/16/19 12:33 PM  Result Value Ref Range   Lithium Lvl 1.64 (HH) 0.60 - 1.20 mmol/L    Comment: CRITICAL RESULT CALLED TO, READ BACK BY AND VERIFIED WITH: K PRICE,RN 1336 02/16/2019 WBOND Performed at [facility], [place] [address]  [place], [place] [address]   Troponin I (High Sensitivity)     Status: None   Collection Time: 02/16/19  2:10 PM  Result Value Ref Range   Troponin I (High Sensitivity) 5 <18 ng/L    Comment: (NOTE) Elevated high sensitivity troponin I (hsTnI) values and significant  changes across serial measurements may suggest ACS but many other  chronic and acute conditions are known to elevate hsTnI results.  Refer to the "Links" section for chest pain algorithms and additional  guidance. Performed at [facility], [place] [address] [place], [place] [address]   SARS CORONAVIRUS 2 (TAT 6-24 HRS) Nasopharyngeal Nasopharyngeal Swab     Status: None   Collection Time: 02/16/19  2:10 PM   Specimen: Nasopharyngeal Swab  Result Value Ref Range   SARS Coronavirus 2 NEGATIVE NEGATIVE    Comment: (NOTE) SARS-CoV-2 target nucleic acids are NOT DETECTED. The SARS-CoV-2 RNA is generally detectable in upper and lower respiratory specimens during the acute phase of infection. Negative results do not preclude SARS-CoV-2 infection, do not rule out co-infections with other pathogens, and should not be used as the sole basis for treatment or other patient management  decisions. Negative results must be combined with clinical observations, patient history, and epidemiological information. The expected result is Negative. Fact Sheet for Patients: SugarRoll.be Fact Sheet for Healthcare Providers: https://www.woods-mathews.com/ This test is not yet approved or cleared by the [place] FDA and  has been authorized for detection and/or diagnosis of SARS-CoV-2 by FDA under an Emergency Use Authorization (EUA). This EUA will remain  in effect (meaning this test can be used) for the duration of the COVID-19 declaration under Section 56 4(b)(1) of the Act, 21 U.S.C. section 360bbb-3(b)(1), unless the authorization is terminated or revoked sooner. Performed at [facility], [place] [address] [place], [place] [address]    Dg Chest 2 View  Result Date: 02/16/2019 CLINICAL DATA:  Shortness of breath EXAM: CHEST - 2 VIEW COMPARISON:  02/07/2019 FINDINGS: Cardiac shadows within normal limits. The lungs are well aerated bilaterally. No focal infiltrate or sizable effusion is seen. No bony abnormality is noted. IMPRESSION: No acute abnormality seen. Electronically Signed   By: Inez Catalina M.D.   On: 02/16/2019 12:23   Ct Head Wo Contrast  Result Date: 02/16/2019 CLINICAL DATA:  Altered mental status.  Loss of consciousness. EXAM: CT HEAD WITHOUT CONTRAST TECHNIQUE: Contiguous axial images were obtained from the base of the skull through the vertex without intravenous contrast. COMPARISON:  Brain MRI 12/25/2018.  Head CT 12/23/2018. FINDINGS: Brain: No evidence of acute infarction, hemorrhage, hydrocephalus, extra-axial collection or mass lesion/mass effect. Vascular: No hyperdense vessel or unexpected calcification. Skull: Intact.  No focal lesion. Sinuses/Orbits: Negative. Other: None. IMPRESSION: Negative head CT. Electronically Signed   By: Inge Rise M.D.   On: 02/16/2019 12:26    Pending Labs Unresulted Labs (From admission, onward)    Start     Ordered   02/17/19 0500  CBC  Tomorrow morning,   R     02/16/19 1426   02/17/19 [id number]  Basic metabolic panel  Tomorrow morning,   R     02/16/19 1426   02/17/19 0500  Lithium level  Tomorrow morning,   R     02/16/19 1427          Vitals/Pain Today's Vitals   02/16/19 2300 02/16/19 2321 02/16/19 2322 02/16/19 2324  BP: (!) 155/81 (!) 172/94 (!) 163/85   Pulse:   65   Resp: 15 17 16    Temp:      TempSrc:      SpO2:   99%   PainSc:    0-No pain    Isolation Precautions No active isolations  Medications Medications  sodium chloride 0.9 % bolus 500 mL (0 mLs Intravenous Stopped 02/16/19 1551)    Followed by  0.9 %  sodium chloride infusion (1,000 mLs Intravenous New Bag/Given 02/16/19  1552)  enoxaparin (LOVENOX) injection 40 mg (40 mg Subcutaneous Given 02/16/19 1554)  sodium chloride flush (NS) 0.9 % injection 3 mL (3 mLs Intravenous Given 02/16/19 2230)  acetaminophen (TYLENOL) tablet 650 mg (has no administration in time range)    Or  acetaminophen (TYLENOL) suppository 650 mg (has no administration in time range)  ondansetron (ZOFRAN) tablet 4 mg (has no administration in time range)    Or  ondansetron (ZOFRAN) injection 4 mg (has no administration in time range)  albuterol (PROVENTIL) (2.5 MG/3ML) 0.083% nebulizer solution 2.5 mg (has no administration in time range)  OLANZapine (ZYPREXA) tablet 10 mg (has no administration in time range)  OLANZapine (ZYPREXA) tablet 20 mg (20 mg Oral Given 02/16/19 2230)  famotidine (PEPCID) tablet 40 mg (40 mg Oral Given 02/16/19 2230)  hydrOXYzine (ATARAX/VISTARIL) tablet 50 mg (50 mg Oral Given 02/16/19 2230)  Valbenazine Tosylate CAPS 1 tablet (1 tablet Oral Not Given 02/16/19 2222)  sodium chloride 0.9 % bolus 1,000 mL (0 mLs Intravenous Stopped 02/16/19 1940)    Mobility walks with person assist High fall risk   Focused Assessments Neuro Assessment Handoff:  Cardiac Rhythm: Normal sinus rhythm       Neuro Assessment: Within Defined Limits  If patient is a Neuro Trauma and patient is going to OR before floor call report to [place] nurse: [phone] or [phone]     R Recommendations: See Admitting Provider Note  Report given to:   Additional Notes: -

## 2019-02-17 NOTE — Progress Notes (Signed)
Discharge orders received.   Cloe notified of discharge and suggested getting a cab voucher d/t COVID they are limited on transporting people.   Notified SW in ED & received a Cab voucher.  Notified Cloe that Cab expected to pick up patient at 530pm & she will arrange after hours personnel IV removed w/o difficulty.  Discharge summary reviewed with patient.  Anson Crofts, RN

## 2019-02-17 NOTE — Progress Notes (Signed)
PT Cancellation Note  Patient Details Name: Karen Best MRN: [id number] DOB: 11/13/1955   Cancelled Treatment:    Reason Eval/Treat Not Completed: PT screened, no needs identified, will sign off per RN, patient has improved medically and was able to get out of bed/stand/walk to bathroom without difficulty with nursing staff, seemed to be moving very well and doesn't seem to be in need of skilled PT services at this time. PT signing off for now- if patient has urgent rehab needs, please message this therapist through Epic secure messenger (8am-4pm) or call acute rehab office at number below.    Deniece Ree PT, DPT, CBIS  Supplemental Physical Therapist [facility]    Pager [phone] Acute Rehab Office [phone]

## 2019-02-17 NOTE — Consult Note (Signed)
Telepsych Consultation   Reason for Consult:  Generalized weakness Referring Physician:  Dr Florene Glen Location of Patient: [facility] [phone] Location of Provider: [facility]  Patient Identification: Karen Best MRN:  [id number] Principal Diagnosis: Lithium toxicity Diagnosis:  Principal Problem:   Lithium toxicity Active Problems:   Schizophrenia ([place])   Bipolar 1 disorder ([place])   Hyperkalemia   Total Time spent with patient: 30 minutes  Subjective:   KHOLE MEGA is a 63 y.o. female patient admitted with generalized weakness and recent falls. Patient alert and oriented for assessment, answers appropriately. Patient states, "My body has been hurting, all over, my arms and my legs." Patient reports, "I live alone but I have a nurse, [name],  who helps me with my medications." Patient is able to accurately discuss home medications and doses.   HPI:  Patient denies suicidal ideations, denies homicidal ideations. Patient denies hallucinations. Patient denies any self-harm behaviors. Patient states "I just want help with this pain, can you write something for me?" Explained psychiatry role and encouraged patient to follow up with attending regarding pain.   Past Psychiatric History: Schizophrenia, Bipolar I Disorder  Risk to Self:  No Risk to Others:  No Prior Inpatient Therapy:  Yes Prior Outpatient Therapy:  Yes  Past Medical History:  Past Medical History:  Diagnosis Date  . Asthma   . Bipolar 1 disorder ([place])   . Fibromyalgia   . Schizophrenia ([place])   . Tobacco abuse     Past Surgical History:  Procedure Laterality Date  . CESAREAN SECTION    . TUBAL LIGATION     Family History:  Family History  Problem Relation Age of Onset  . Liver cancer Neg Hx   . Liver disease Neg Hx    Family Psychiatric  History: Unknown Social History:  Social History   Substance and Sexual Activity  Alcohol Use Yes   Comment: occ     Social History   Substance and  Sexual Activity  Drug Use Yes  . Types: Cocaine   Comment: crack cocaine (last use 10/25/2018)    Social History   Socioeconomic History  . Marital status: Legally Separated    Spouse name: Not on file  . Number of children: Not on file  . Years of education: Not on file  . Highest education level: Not on file  Occupational History  . Not on file  Social Needs  . Financial resource strain: Not on file  . Food insecurity    Worry: Not on file    Inability: Not on file  . Transportation needs    Medical: Not on file    Non-medical: Not on file  Tobacco Use  . Smoking status: Current Every Day Smoker    Packs/day: 0.50    Types: Cigarettes  . Smokeless tobacco: Never Used  Substance and Sexual Activity  . Alcohol use: Yes    Comment: occ  . Drug use: Yes    Types: Cocaine    Comment: crack cocaine (last use 10/25/2018)  . Sexual activity: Yes    Birth control/protection: None  Lifestyle  . Physical activity    Days per week: Not on file    Minutes per session: Not on file  . Stress: Not on file  Relationships  . Social Herbalist on phone: Not on file    Gets together: Not on file    Attends religious service: Not on file    Active member of  club or organization: Not on file    Attends meetings of clubs or organizations: Not on file    Relationship status: Not on file  Other Topics Concern  . Not on file  Social History Narrative  . Not on file   Additional Social History:    Allergies:   Allergies  Allergen Reactions  . Penicillins Other (See Comments)    Child hood reaction Has patient had a PCN reaction causing immediate rash, facial/tongue/throat swelling, SOB or lightheadedness with hypotension: Yes Has patient had a PCN reaction causing severe rash involving mucus membranes or skin necrosis: No Has patient had a PCN reaction that required hospitalization No Has patient had a PCN reaction occurring within the last 10 years: No If all of the  above answers are "NO", then may proceed with C    Labs:  Results for orders placed or performed during the hospital encounter of 02/16/19 (from the past 48 hour(s))  Urine rapid drug screen (hosp performed)     Status: None   Collection Time: 02/16/19 12:20 PM  Result Value Ref Range   Opiates NONE DETECTED NONE DETECTED   Cocaine NONE DETECTED NONE DETECTED   Benzodiazepines NONE DETECTED NONE DETECTED   Amphetamines NONE DETECTED NONE DETECTED   Tetrahydrocannabinol NONE DETECTED NONE DETECTED   Barbiturates NONE DETECTED NONE DETECTED    Comment: (NOTE) DRUG SCREEN FOR MEDICAL PURPOSES ONLY.  IF CONFIRMATION IS NEEDED FOR ANY PURPOSE, NOTIFY LAB WITHIN 5 DAYS. LOWEST DETECTABLE LIMITS FOR URINE DRUG SCREEN Drug Class                     Cutoff (ng/mL) Amphetamine and metabolites    1000 Barbiturate and metabolites    200 Benzodiazepine                 [id number] Tricyclics and metabolites     300 Opiates and metabolites        300 Cocaine and metabolites        300 THC                            50 Performed at [facility], [place] [address], [place], [place] [address]   Urinalysis, Routine w reflex microscopic     Status: Abnormal   Collection Time: 02/16/19 12:30 PM  Result Value Ref Range   Color, Urine YELLOW YELLOW   APPearance HAZY (A) CLEAR   Specific Gravity, Urine 1.014 1.005 - 1.030   pH 7.0 5.0 - 8.0   Glucose, UA NEGATIVE NEGATIVE mg/dL   Hgb urine dipstick NEGATIVE NEGATIVE   Bilirubin Urine NEGATIVE NEGATIVE   Ketones, ur NEGATIVE NEGATIVE mg/dL   Protein, ur NEGATIVE NEGATIVE mg/dL   Nitrite NEGATIVE NEGATIVE   Leukocytes,Ua NEGATIVE NEGATIVE    Comment: Performed at [facility] [address] [place], [place] [address]  Comprehensive metabolic panel     Status: Abnormal   Collection Time: 02/16/19 12:33 PM  Result Value Ref Range   Sodium 135 135 - 145 mmol/L   Potassium 5.6 (H) 3.5 - 5.1 mmol/L   Chloride 105 98 - 111 mmol/L   CO2 23 22  - 32 mmol/L   Glucose, Bld 93 70 - 99 mg/dL   BUN 11 8 - 23 mg/dL   Creatinine, Ser 0.85 0.44 - 1.00 mg/dL   Calcium 10.2 8.9 - 10.3 mg/dL   Total Protein 7.2 6.5 -  8.1 g/dL   Albumin 3.4 (L) 3.5 - 5.0 g/dL   AST 34 15 - 41 U/L   ALT 23 0 - 44 U/L   Alkaline Phosphatase 69 38 - 126 U/L   Total Bilirubin 0.7 0.3 - 1.2 mg/dL   GFR calc non Af Amer >60 >60 mL/min   GFR calc Af Amer >60 >60 mL/min   Anion gap 7 5 - 15    Comment: Performed at [facility] [address], [place], [place] [address]  Ethanol     Status: None   Collection Time: 02/16/19 12:33 PM  Result Value Ref Range   Alcohol, Ethyl (B) <10 <10 mg/dL    Comment: (NOTE) Lowest detectable limit for serum alcohol is 10 mg/dL. For medical purposes only. Performed at [facility], [place] [address] [place], [place] [address]   Troponin I (High Sensitivity)     Status: None   Collection Time: 02/16/19 12:33 PM  Result Value Ref Range   Troponin I (High Sensitivity) 4 <18 ng/L    Comment: (NOTE) Elevated high sensitivity troponin I (hsTnI) values and significant  changes across serial measurements may suggest ACS but many other  chronic and acute conditions are known to elevate hsTnI results.  Refer to the "Links" section for chest pain algorithms and additional  guidance. Performed at [facility], [place] [address] [place], [place] [address]   Lactic acid, plasma     Status: None   Collection Time: 02/16/19 12:33 PM  Result Value Ref Range   Lactic Acid, Venous 1.2 0.5 - 1.9 mmol/L    Comment: Performed at [facility] [address] [place], [place] [address]  Lipase, blood     Status: None   Collection Time: 02/16/19 12:33 PM  Result Value Ref Range   Lipase 34 11 - 51 U/L    Comment: Performed at [facility] [address], [place], [place] [address]  CBC with Differential     Status: Abnormal   Collection Time: 02/16/19 12:33 PM  Result Value Ref Range   WBC 7.6 4.0 - 10.5  K/uL   RBC 5.20 (H) 3.87 - 5.11 MIL/uL   Hemoglobin 12.1 12.0 - 15.0 g/dL   HCT 40.7 36.0 - 46.0 %   MCV 78.3 (L) 80.0 - 100.0 fL   MCH 23.3 (L) 26.0 - 34.0 pg   MCHC 29.7 (L) 30.0 - 36.0 g/dL   RDW 15.9 (H) 11.5 - 15.5 %   Platelets 353 150 - 400 K/uL   nRBC 0.0 0.0 - 0.2 %   Neutrophils Relative % 52 %   Neutro Abs 3.9 1.7 - 7.7 K/uL   Lymphocytes Relative 30 %   Lymphs Abs 2.2 0.7 - 4.0 K/uL   Monocytes Relative 7 %   Monocytes Absolute 0.6 0.1 - 1.0 K/uL   Eosinophils Relative 11 %   Eosinophils Absolute 0.9 (H) 0.0 - 0.5 K/uL   Basophils Relative 0 %   Basophils Absolute 0.0 0.0 - 0.1 K/uL   Immature Granulocytes 0 %   Abs Immature Granulocytes 0.03 0.00 - 0.07 K/uL    Comment: Performed at [facility], [place] [address] [place], [place] [address]  Protime-INR     Status: None   Collection Time: 02/16/19 12:33 PM  Result Value Ref Range   Prothrombin Time 12.8 11.4 - 15.2 seconds   INR 1.0 0.8 - 1.2    Comment: (NOTE) INR  goal varies based on device and disease states. Performed at [facility], [place] [address], [place], [place] [address]   Lithium level     Status: Abnormal   Collection Time: 02/16/19 12:33 PM  Result Value Ref Range   Lithium Lvl 1.64 (HH) 0.60 - 1.20 mmol/L    Comment: CRITICAL RESULT CALLED TO, READ BACK BY AND VERIFIED WITH: K PRICE,RN 1336 02/16/2019 WBOND Performed at [facility], [place] [address], [place], [place] [address]   Troponin I (High Sensitivity)     Status: None   Collection Time: 02/16/19  2:10 PM  Result Value Ref Range   Troponin I (High Sensitivity) 5 <18 ng/L    Comment: (NOTE) Elevated high sensitivity troponin I (hsTnI) values and significant  changes across serial measurements may suggest ACS but many other  chronic and acute conditions are known to elevate hsTnI results.  Refer to the "Links" section for chest pain algorithms and additional  guidance. Performed at [facility], [place] [address] [place], [place] [address]   SARS CORONAVIRUS 2 (TAT 6-24 HRS) Nasopharyngeal Nasopharyngeal Swab     Status: None   Collection Time: 02/16/19  2:10 PM   Specimen: Nasopharyngeal Swab  Result Value Ref Range   SARS Coronavirus 2 NEGATIVE NEGATIVE    Comment: (NOTE) SARS-CoV-2 target nucleic acids are NOT DETECTED. The SARS-CoV-2 RNA is generally detectable in upper and lower respiratory specimens during the acute phase of infection. Negative results do not preclude SARS-CoV-2 infection, do not rule out co-infections with other pathogens, and should not be used as the sole basis for treatment or other patient management decisions. Negative results must be combined with clinical observations, patient history, and epidemiological information. The expected result is Negative. Fact Sheet for Patients: SugarRoll.be Fact Sheet for Healthcare Providers: https://www.woods-mathews.com/ This test is not yet approved or cleared by the [place] FDA and  has been authorized for detection and/or diagnosis of SARS-CoV-2 by FDA under an Emergency Use Authorization (EUA). This EUA will remain  in effect (meaning this test can be used) for the duration of the COVID-19 declaration under Section 56 4(b)(1) of the Act, 21 U.S.C. section 360bbb-3(b)(1), unless the authorization is terminated or revoked sooner. Performed at [facility], [place] [address] [place], [place] [address]   CBC     Status: Abnormal   Collection Time: 02/17/19  3:16 AM  Result Value Ref Range   WBC 8.9 4.0 - 10.5 K/uL   RBC 4.82 3.87 - 5.11 MIL/uL   Hemoglobin 11.4 (L) 12.0 - 15.0 g/dL   HCT 38.3 36.0 - 46.0 %   MCV 79.5 (L) 80.0 - 100.0 fL   MCH 23.7 (L) 26.0 - 34.0 pg   MCHC 29.8 (L) 30.0 - 36.0 g/dL   RDW 15.9 (H) 11.5 - 15.5 %   Platelets 287 150 - 400 K/uL   nRBC 0.0 0.0 - 0.2 %    Comment: Performed at [facility], [place] [address], [place], [place] [id number]   Basic metabolic panel     Status: Abnormal   Collection Time: 02/17/19  3:16 AM  Result Value Ref Range   Sodium 137 135 - 145 mmol/L   Potassium 4.0 3.5 - 5.1 mmol/L   Chloride 114 (H) 98 - 111 mmol/L   CO2 19 (L) 22 - 32 mmol/L   Glucose, Bld 107 (H) 70 - 99 mg/dL   BUN 11 8 - 23 mg/dL  Creatinine, Ser 0.80 0.44 - 1.00 mg/dL   Calcium 9.1 8.9 - 10.3 mg/dL   GFR calc non Af Amer >60 >60 mL/min   GFR calc Af Amer >60 >60 mL/min   Anion gap 4 (L) 5 - 15    Comment: Performed at [facility] [address] [place], [place] [address]  Lithium level     Status: None   Collection Time: 02/17/19  3:16 AM  Result Value Ref Range   Lithium Lvl 0.72 0.60 - 1.20 mmol/L    Comment: Performed at [facility] [address], [place], [place] [address]    Medications:  Current Facility-Administered Medications  Medication Dose Route Frequency Provider Last Rate Last Dose  . 0.9 %  sodium chloride infusion  1,000 mL Intravenous Continuous Charlesetta Shanks, MD 125 mL/hr at 02/17/19 0009    . acetaminophen (TYLENOL) tablet 650 mg  650 mg Oral Q6H PRN Fuller Plan A, MD   650 mg at 02/17/19 1459   Or  . acetaminophen (TYLENOL) suppository 650 mg  650 mg Rectal Q6H PRN Smith, Rondell A, MD      . albuterol (PROVENTIL) (2.5 MG/3ML) 0.083% nebulizer solution 2.5 mg  2.5 mg Nebulization Q6H PRN Smith, Rondell A, MD      . docusate sodium (COLACE) capsule 200 mg  200 mg Oral QHS Kirby-Graham, Karsten Fells, NP   200 mg at 02/17/19 0127  . enoxaparin (LOVENOX) injection 40 mg  40 mg Subcutaneous Q24H Smith, Rondell A, MD   40 mg at 02/17/19 1459  . famotidine (PEPCID) tablet 40 mg  40 mg Oral BID Fuller Plan A, MD   40 mg at 02/17/19 1136  . hydrOXYzine (ATARAX/VISTARIL) tablet 50 mg  50 mg Oral QHS Smith, Rondell A, MD   50 mg at 02/16/19 2230  . OLANZapine (ZYPREXA) tablet 10 mg  10 mg Oral Daily Fuller Plan A, MD   10 mg at 02/17/19 1136  . OLANZapine (ZYPREXA) tablet 20 mg  20 mg  Oral QHS Smith, Rondell A, MD   20 mg at 02/16/19 2230  . ondansetron (ZOFRAN) tablet 4 mg  4 mg Oral Q6H PRN Fuller Plan A, MD       Or  . ondansetron (ZOFRAN) injection 4 mg  4 mg Intravenous Q6H PRN Smith, Rondell A, MD      . sodium chloride flush (NS) 0.9 % injection 3 mL  3 mL Intravenous Q12H Smith, Rondell A, MD   3 mL at 02/16/19 2230  . Valbenazine Tosylate CAPS 1 tablet  1 tablet Oral QHS Norval Morton, MD        Musculoskeletal: Strength & Muscle Tone: unable to assess Gait & Station: unable to assess Patient leans: unable to assess  Psychiatric Specialty Exam: Physical Exam  Nursing note and vitals reviewed. Constitutional: She is oriented to person, place, and time. She appears well-developed.  HENT:  Head: Normocephalic.  Cardiovascular: Normal rate.  Respiratory: Effort normal.  Neurological: She is alert and oriented to person, place, and time.    Review of Systems  Constitutional: Negative.   HENT: Negative.   Eyes: Negative.   Respiratory: Negative.   Cardiovascular: Negative.   Gastrointestinal: Negative.   Genitourinary: Negative.   Musculoskeletal: Negative.   Skin: Negative.   Neurological: Negative.   Endo/Heme/Allergies: Negative.   Psychiatric/Behavioral: Negative.     Blood pressure (!) 153/74, pulse 73, temperature 98.3 F (36.8 C), temperature source Oral, resp. rate 19, height  5\' 5"  (1.651 m), weight 63 kg, SpO2 98 %.Body mass index is 23.11 kg/m.  General Appearance: Casual  Eye Contact:  Good  Speech:  Clear and Coherent  Volume:  Normal  Mood:  Euthymic  Affect:  Appropriate  Thought Process:  Coherent and Descriptions of Associations: Intact  Orientation:  Full (Time, Place, and Person)  Thought Content:  Logical  Suicidal Thoughts:  No  Homicidal Thoughts:  No  Memory:  Immediate;   Good Recent;   Good  Judgement:  Good  Insight:  Fair  Psychomotor Activity:  TD  Concentration:  Concentration: Good  Recall:  [place]  of Knowledge:  Fair  Language:  Fair  Akathisia:  NA  Handed:  Right  AIMS (if indicated):     Assets:  Agricultural consultant Housing Social Support  ADL's:  Intact  Cognition:  WNL  Sleep:        Treatment Plan Summary: Plan cleared by psychiatry, follow up with outpatient psychiatry.   Disposition: No evidence of imminent risk to self or others at present.   Patient does not meet criteria for psychiatric inpatient admission. Discussed crisis plan, support from social network, calling 911, coming to the Emergency Department, and calling Suicide Hotline.  This service was provided via telemedicine using a 2-way, interactive audio and video technology.  Names of all persons participating in this telemedicine service and their role in this encounter. Name: Kathe Mariner Role: Patient  Name: Letitia Libra Role: [place], [place] 02/17/2019 4:01 PM

## 2019-02-17 NOTE — Discharge Summary (Signed)
Physician Discharge Summary  Karen Best [id number] DOB: 29-Dec-1955 DOA: 02/16/2019  PCP: [facility]  Admit date: 02/16/2019 Discharge date: 02/17/2019  Time spent: 40 minutes  Recommendations for Outpatient Follow-up:  1. Follow outpatient CBC/CMP 2. Follow up with psych outpatient for recommendations on lithium dosing (evaluated by inpatient psych while here, they recommended continuing current dose and following up outpatient) 3. Follow up with neurology outpatient - per 10/15 note, ingrezza is supposed to be reduced to 40 mg - discussed this with [name] after discharge (our discharge summary does not reflect this dose change)  Discharge Diagnoses:  Principal Problem:   Lithium toxicity Active Problems:   Schizophrenia ([place])   Bipolar 1 disorder ([place])   Hyperkalemia   Discharge Condition: stable  Diet recommendation: heart healthy  Filed Weights   02/17/19 0031  Weight: 63 kg    History of present illness:  Karen Best is Karen Best 63 y.o. female with medical history significant of bipolar 1 disorder, schizophrenia, asthma, fibromyalgia, dementia, and tobacco abuse.  She presents with complaints generalized weakness, dizziness, headaches, nausea, malaise, tremor, and several falls for the last 2 weeks.  Denies having any significant suicidal ideations, cough, fever, recent sick contacts, vomiting, chest pain, urinary frequency, dysuria, or diarrhea.  She denies any trauma to her head or loss of consciousness.  She reports that [name] is trying to kill her.  She reports that her lithium dose was recently possibly increased of days ago.  Patient had just recently been hospitalized in August for confusion thought to be related to patient's schizophrenia.  She was discharged home skilled nursing facility due to inability to care for self home.  ED Course: Patient was noted to be afebrile and hemodynamically stable.  CT scan of the brain negative  for any acute abnormalities.  Labs significant for potassium 5.6 and lithium level 1.64.  Patient was ordered 500 mL normal saline IV fluid bolus, and then placed on Kiira Brach rate 125 mL/h.  She was admitted for suspected lithium toxicity with weakness, dizziness, malaise, etc.  Lithium level improved on hospital day 1.  Case was discussed with psychiatry who recommended continuing lithium at current dose of 300 mg BID and follow up outpatient with psychiatry.  Case was discussed with [name] who is her psych nurse who will help arrange close follow up.  Hospital Course:  Lithium toxicity: Acute. Patient presents with complaints of malaise, headaches, nausea, and frequent falls. Lithium level 1.64 on presentation.  Improved to 0.72 today.  Discussed with [name] who notes dose has changed frequently recently between 450 BID and 300 BID and was recently decreased to 300 mg BID on 10/15.  Ms. Simona Huh notes that on 10/20 the psychiatrist who follows her was considering discontinuing the lithium.  Consulted psychiatry who cleared her from psych standpoint and recommended continuing current dose of lithium (300 mg BID), encouraging hydration, and follow up with outpatient psychiatrist for adjustment of her lithium dosing.     Bipolar 1 disorder, schizophrenia:Denies SI, hallucinations.  She mentioned to admitting provider that [name] was trying to kill her, but when I discuss this with her this morning, she is not saying this anymore.  Appreciate psych assistance, she's been cleared by psych. -Continue olanzapine andValbenazine.  Resume lithium per inpatient psych consult.   - Follow with outpatient psych for further adjustment to dosing  Hyperkalemia: Initial potassium elevated at 5.6. Suspect related with toxicity. -improved  Elevated ANA with ENA-RNP antibody:  Recently appreciated on work-up by Dr. Jannifer Franklin neurology. Questionedassociation with possible mixed connective tissue disease or lupus.  Follow  with neurology outpatient.    COVID-19 screening pending -Follow-up COVID-19 results  GERD -Continue that  Procedures:  none  Consultations:  psychiatry  Discharge Exam: Vitals:   02/17/19 0300 02/17/19 1338  BP: (!) 155/74 (!) 153/74  Pulse: 63 73  Resp: 20 19  Temp: 98.1 F (36.7 C) 98.3 F (36.8 C)  SpO2: 99% 98%   Fiora Weill&Ox3. Feels better than at admission, feels close to normal. Discussed with Ms. Simona Huh, plan for discharge - she's aware and agreeable as well.  General: No acute distress. Cardiovascular: Heart sounds show Herrick Hartog regular rate, and rhythm.  Lungs: Clear to auscultation bilaterally Abdomen: Soft, nontender, nondistended Neurological: Alert and oriented 3. Moves all extremities 4. Cranial nerves II through XII grossly intact. Skin: Warm and dry. No rashes or lesions. Extremities: No clubbing or cyanosis. No edema.  Discharge Instructions   Discharge Instructions    Call MD for:  difficulty breathing, headache or visual disturbances   Complete by: As directed    Call MD for:  extreme fatigue   Complete by: As directed    Call MD for:  hives   Complete by: As directed    Call MD for:  persistant dizziness or light-headedness   Complete by: As directed    Call MD for:  persistant nausea and vomiting   Complete by: As directed    Call MD for:  redness, tenderness, or signs of infection (pain, swelling, redness, odor or green/yellow discharge around incision site)   Complete by: As directed    Call MD for:  severe uncontrolled pain   Complete by: As directed    Call MD for:  temperature >100.4   Complete by: As directed    Diet - low sodium heart healthy   Complete by: As directed    Diet - low sodium heart healthy   Complete by: As directed    Discharge instructions   Complete by: As directed    You were seen for confusion and weakness.  We think this was due to lithium toxicity.   Please resume your prior to admission dose of lithium.  Please  ensure you stay hydrated while taking your lithium.  It's extremely important you follow up with your outpatient psychiatrist for any adjustments to your lithium.  Please follow up with Dr. Jannifer Franklin from neurology.   Return for new, recurrent, or worsening symptoms.  Please ask your PCP to request records from this hospitalization so they know what was done and what the next steps will be.   Increase activity slowly   Complete by: As directed    Increase activity slowly   Complete by: As directed      Allergies as of 02/17/2019      Reactions   Penicillins Other (See Comments)   Child hood reaction Has patient had Reyaan Thoma PCN reaction causing immediate rash, facial/tongue/throat swelling, SOB or lightheadedness with hypotension: Yes Has patient had Chantavia Bazzle PCN reaction causing severe rash involving mucus membranes or skin necrosis: No Has patient had Sencere Symonette PCN reaction that required hospitalization No Has patient had Byrne Capek PCN reaction occurring within the last 10 years: No If all of the above answers are "NO", then may proceed with C      Medication List    TAKE these medications   acetaminophen 325 MG tablet Commonly known as: TYLENOL Take 650 mg by mouth every  6 (six) hours as needed for mild pain or moderate pain.   cetirizine 10 MG tablet Commonly known as: ZYRTEC Take 10 mg by mouth daily.   famotidine 40 MG tablet Commonly known as: PEPCID Take 40 mg by mouth 2 (two) times daily.   hydrOXYzine 50 MG tablet Commonly known as: ATARAX/VISTARIL Take 50 mg by mouth at bedtime.   ibuprofen 200 MG tablet Commonly known as: ADVIL Take 400 mg by mouth every 6 (six) hours as needed for headache or mild pain.   Ingrezza 80 MG Caps Generic drug: Valbenazine Tosylate Take 1 tablet by mouth at bedtime.   lithium carbonate 300 MG CR tablet Commonly known as: LITHOBID Take 1 tablet (300 mg total) by mouth 2 (two) times daily. What changed:   medication strength  how much to take    OLANZapine 10 MG tablet Commonly known as: ZYPREXA Take 10 mg by mouth daily.   OLANZapine 20 MG tablet Commonly known as: ZYPREXA Take 1 tablet (20 mg total) by mouth at bedtime.      Allergies  Allergen Reactions  . Penicillins Other (See Comments)    Child hood reaction Has patient had Dallon Dacosta PCN reaction causing immediate rash, facial/tongue/throat swelling, SOB or lightheadedness with hypotension: Yes Has patient had Karis Rilling PCN reaction causing severe rash involving mucus membranes or skin necrosis: No Has patient had Vannesa Abair PCN reaction that required hospitalization No Has patient had Xan Ingraham PCN reaction occurring within the last 10 years: No If all of the above answers are "NO", then may proceed with C      The results of significant diagnostics from this hospitalization (including imaging, microbiology, ancillary and laboratory) are listed below for reference.    Significant Diagnostic Studies: Dg Chest 2 View  Result Date: 02/16/2019 CLINICAL DATA:  Shortness of breath EXAM: CHEST - 2 VIEW COMPARISON:  02/07/2019 FINDINGS: Cardiac shadows within normal limits. The lungs are well aerated bilaterally. No focal infiltrate or sizable effusion is seen. No bony abnormality is noted. IMPRESSION: No acute abnormality seen. Electronically Signed   By: Inez Catalina M.D.   On: 02/16/2019 12:23   Ct Head Wo Contrast  Result Date: 02/16/2019 CLINICAL DATA:  Altered mental status.  Loss of consciousness. EXAM: CT HEAD WITHOUT CONTRAST TECHNIQUE: Contiguous axial images were obtained from the base of the skull through the vertex without intravenous contrast. COMPARISON:  Brain MRI 12/25/2018.  Head CT 12/23/2018. FINDINGS: Brain: No evidence of acute infarction, hemorrhage, hydrocephalus, extra-axial collection or mass lesion/mass effect. Vascular: No hyperdense vessel or unexpected calcification. Skull: Intact.  No focal lesion. Sinuses/Orbits: Negative. Other: None. IMPRESSION: Negative head CT.  Electronically Signed   By: Inge Rise M.D.   On: 02/16/2019 12:26   Dg Chest Port 1 View  Result Date: 02/07/2019 CLINICAL DATA:  Chest pain and shortness of breath EXAM: PORTABLE CHEST 1 VIEW COMPARISON:  December 23, 2018 FINDINGS: No edema or consolidation. Heart size and pulmonary vascularity are normal. No adenopathy. Small metallic fragments are noted overlying the left shoulder, stable. IMPRESSION: No edema or consolidation.  Stable cardiac silhouette. Electronically Signed   By: Lowella Grip III M.D.   On: 02/07/2019 17:51    Microbiology: Recent Results (from the past 240 hour(s))  SARS CORONAVIRUS 2 (TAT 6-24 HRS) Nasopharyngeal Nasopharyngeal Swab     Status: None   Collection Time: 02/16/19  2:10 PM   Specimen: Nasopharyngeal Swab  Result Value Ref Range Status   SARS Coronavirus 2 NEGATIVE NEGATIVE Final  Comment: (NOTE) SARS-CoV-2 target nucleic acids are NOT DETECTED. The SARS-CoV-2 RNA is generally detectable in upper and lower respiratory specimens during the acute phase of infection. Negative results do not preclude SARS-CoV-2 infection, do not rule out co-infections with other pathogens, and should not be used as the sole basis for treatment or other patient management decisions. Negative results must be combined with clinical observations, patient history, and epidemiological information. The expected result is Negative. Fact Sheet for Patients: SugarRoll.be Fact Sheet for Healthcare Providers: https://www.woods-mathews.com/ This test is not yet approved or cleared by the [place] FDA and  has been authorized for detection and/or diagnosis of SARS-CoV-2 by FDA under an Emergency Use Authorization (EUA). This EUA will remain  in effect (meaning this test can be used) for the duration of the COVID-19 declaration under Section 56 4(b)(1) of the Act, 21 U.S.C. section 360bbb-3(b)(1), unless the authorization  is terminated or revoked sooner. Performed at [facility], [place] [address], [place], [place] [address]      Labs: Basic Metabolic Panel: Recent Labs  Lab 02/16/19 1233 02/17/19 0316  NA 135 137  K 5.6* 4.0  CL 105 114*  CO2 23 19*  GLUCOSE 93 107*  BUN 11 11  CREATININE 0.85 0.80  CALCIUM 10.2 9.1   Liver Function Tests: Recent Labs  Lab 02/16/19 1233  AST 34  ALT 23  ALKPHOS 69  BILITOT 0.7  PROT 7.2  ALBUMIN 3.4*   Recent Labs  Lab 02/16/19 1233  LIPASE 34   No results for input(s): AMMONIA in the last 168 hours. CBC: Recent Labs  Lab 02/16/19 1233 02/17/19 0316  WBC 7.6 8.9  NEUTROABS 3.9  --   HGB 12.1 11.4*  HCT 40.7 38.3  MCV 78.3* 79.5*  PLT 353 287   Cardiac Enzymes: No results for input(s): CKTOTAL, CKMB, CKMBINDEX, TROPONINI in the last 168 hours. BNP: BNP (last 3 results) No results for input(s): BNP in the last 8760 hours.  ProBNP (last 3 results) No results for input(s): PROBNP in the last 8760 hours.  CBG: No results for input(s): GLUCAP in the last 168 hours.     Signed:  Fayrene Helper MD.  [facility] 02/17/2019, 4:51 PM

## 2019-02-28 ENCOUNTER — Ambulatory Visit: Payer: Medicaid Other | Admitting: Diagnostic Neuroimaging

## 2019-04-29 ENCOUNTER — Emergency Department (HOSPITAL_COMMUNITY): Payer: Medicaid Other

## 2019-04-29 ENCOUNTER — Emergency Department (HOSPITAL_COMMUNITY)
Admission: EM | Admit: 2019-04-29 | Discharge: 2019-04-30 | Disposition: A | Discharge status: Home / Self Care | Payer: Medicaid Other | Attending: Emergency Medicine | Admitting: Emergency Medicine

## 2019-04-29 ENCOUNTER — Other Ambulatory Visit: Payer: Self-pay

## 2019-04-29 ENCOUNTER — Encounter (HOSPITAL_COMMUNITY): Payer: Self-pay | Admitting: Emergency Medicine

## 2019-04-29 DIAGNOSIS — Z79899 Other long term (current) drug therapy: Secondary | ICD-10-CM | POA: Insufficient documentation

## 2019-04-29 DIAGNOSIS — Z20828 Contact with and (suspected) exposure to other viral communicable diseases: Secondary | ICD-10-CM | POA: Diagnosis not present

## 2019-04-29 DIAGNOSIS — Z20822 Contact with and (suspected) exposure to covid-19: Secondary | ICD-10-CM

## 2019-04-29 DIAGNOSIS — F17210 Nicotine dependence, cigarettes, uncomplicated: Secondary | ICD-10-CM | POA: Insufficient documentation

## 2019-04-29 DIAGNOSIS — M791 Myalgia, unspecified site: Secondary | ICD-10-CM | POA: Insufficient documentation

## 2019-04-29 DIAGNOSIS — J45909 Unspecified asthma, uncomplicated: Secondary | ICD-10-CM | POA: Diagnosis not present

## 2019-04-29 DIAGNOSIS — I10 Essential (primary) hypertension: Secondary | ICD-10-CM

## 2019-04-29 LAB — URINALYSIS, ROUTINE W REFLEX MICROSCOPIC
Bilirubin Urine: NEGATIVE
Glucose, UA: NEGATIVE mg/dL
Hgb urine dipstick: NEGATIVE
Ketones, ur: NEGATIVE mg/dL
Leukocytes,Ua: NEGATIVE
Nitrite: NEGATIVE
Protein, ur: NEGATIVE mg/dL
Specific Gravity, Urine: 1.005 (ref 1.005–1.030)
pH: 7 (ref 5.0–8.0)

## 2019-04-29 LAB — COMPREHENSIVE METABOLIC PANEL
ALT: 25 U/L (ref 0–44)
AST: 22 U/L (ref 15–41)
Albumin: 3.9 g/dL (ref 3.5–5.0)
Alkaline Phosphatase: 60 U/L (ref 38–126)
Anion gap: 10 (ref 5–15)
BUN: 11 mg/dL (ref 8–23)
CO2: 20 mmol/L — ABNORMAL LOW (ref 22–32)
Calcium: 9.7 mg/dL (ref 8.9–10.3)
Chloride: 107 mmol/L (ref 98–111)
Creatinine, Ser: 0.77 mg/dL (ref 0.44–1.00)
GFR calc Af Amer: >60 mL/min (ref >60)
GFR calc non Af Amer: >60 mL/min (ref >60)
Glucose, Bld: 106 mg/dL — ABNORMAL HIGH (ref 70–99)
Potassium: 3.7 mmol/L (ref 3.5–5.1)
Sodium: 137 mmol/L (ref 135–145)
Total Bilirubin: 0.3 mg/dL (ref 0.3–1.2)
Total Protein: 8.2 g/dL — ABNORMAL HIGH (ref 6.5–8.1)

## 2019-04-29 LAB — CBC WITH DIFFERENTIAL/PLATELET
Abs Immature Granulocytes: 0.02 10*3/uL (ref 0.00–0.07)
Basophils Absolute: 0 10*3/uL (ref 0.0–0.1)
Basophils Relative: 0 %
Eosinophils Absolute: 0.5 10*3/uL (ref 0.0–0.5)
Eosinophils Relative: 7 %
HCT: 43.6 % (ref 36.0–46.0)
Hemoglobin: 13.1 g/dL (ref 12.0–15.0)
Immature Granulocytes: 0 %
Lymphocytes Relative: 40 %
Lymphs Abs: 2.7 10*3/uL (ref 0.7–4.0)
MCH: 22.8 pg — ABNORMAL LOW (ref 26.0–34.0)
MCHC: 30 g/dL (ref 30.0–36.0)
MCV: 76 fL — ABNORMAL LOW (ref 80.0–100.0)
Monocytes Absolute: 0.4 10*3/uL (ref 0.1–1.0)
Monocytes Relative: 6 %
Neutro Abs: 3.1 10*3/uL (ref 1.7–7.7)
Neutrophils Relative %: 47 %
Platelets: 297 10*3/uL (ref 150–400)
RBC: 5.74 MIL/uL — ABNORMAL HIGH (ref 3.87–5.11)
RDW: 14.6 % (ref 11.5–15.5)
WBC: 6.7 10*3/uL (ref 4.0–10.5)
nRBC: 0 % (ref 0.0–0.2)

## 2019-04-29 LAB — LITHIUM LEVEL: Lithium Lvl: 0.64 mmol/L (ref 0.60–1.20)

## 2019-04-29 LAB — POC SARS CORONAVIRUS 2 AG -  ED: SARS Coronavirus 2 Ag: NEGATIVE

## 2019-04-29 LAB — CBG MONITORING, ED: Glucose-Capillary: 103 mg/dL — ABNORMAL HIGH (ref 70–99)

## 2019-04-29 NOTE — ED Triage Notes (Signed)
Patient arrived with EMS from home with gen. Body aches /fatigue and bilateral feet swelling onset 3 weeks ago , respirations unlabored / no fever or chills .

## 2019-04-29 NOTE — ED Notes (Signed)
Patient ambulated in room; Sp02 remained 98-100% and NAD.

## 2019-04-29 NOTE — ED Notes (Signed)
Pt called RN over while in waiting area, noted to be pale, groggy, vitals reassessed, pt noted to be hypotensive, brought back to triage area for reassessment while waiting on treatment room. Charge RN notified of the same.

## 2019-04-30 LAB — SARS CORONAVIRUS 2 (TAT 6-24 HRS): SARS Coronavirus 2: NEGATIVE

## 2019-04-30 NOTE — ED Notes (Signed)
PT ambulated to the restroom and back unassisted with no additional problems.

## 2019-04-30 NOTE — ED Provider Notes (Signed)
[facility] EMERGENCY DEPARTMENT Provider Note   CSN: [id number] Arrival date & time: 04/29/19  2035     History Chief Complaint  Patient presents with  . Body Aches    Karen Best is a 64 y.o. female with a past medical history of schizophrenia, bipolar disorder, fibromyalgia, who presents today for evaluation of generalized body aches.  She reports that the body aches have been present for 2 weeks.  She denies any fevers or known sick contacts.  No significant shortness of breath.  She also reports generally feeling fatigued.  She denies any specific chest pain or back pain.  No abdominal pain or headache.  She denies dysuria, increased frequency or urgency.  No nausea or vomiting.  She reports compliance with her medications, denies missing any doses or running out of medications.  She denies any recent trauma.  HPI     Past Medical History:  Diagnosis Date  . Asthma   . Bipolar 1 disorder ([place])   . Fibromyalgia   . Schizophrenia ([place])   . Tobacco abuse     Patient Active Problem List   Diagnosis Date Noted  . Lithium toxicity 02/16/2019  . Bipolar 1 disorder ([place]) 02/16/2019  . Hyperkalemia 02/16/2019  . Acute confusion 12/29/2018  . Toxic metabolic encephalopathy [id number]  . Dysarthria 12/23/2018  . Elevated blood pressure reading 12/23/2018  . Asthma 12/23/2018  . Delirium due to another medical condition   . UTI (urinary tract infection) 12/06/2018  . Acute metabolic encephalopathy [id number]  . 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 Psychosis ([place]) 09/16/2013  . Schizophrenia [facility])     Past Surgical History:  Procedure Laterality Date  . CESAREAN SECTION    . TUBAL LIGATION       OB History   No obstetric history on file.     Family History  Problem Relation Age of Onset  . Liver cancer Neg Hx   . Liver disease Neg Hx     Social History   Tobacco Use  . Smoking status: Current Every Day Smoker   Packs/day: 0.50    Types: Cigarettes  . Smokeless tobacco: Never Used  Substance Use Topics  . Alcohol use: Yes    Comment: occ  . Drug use: Yes    Types: Cocaine    Comment: crack cocaine (last use 10/25/2018)    Home Medications Prior to Admission medications   Medication Sig Start Date End Date Taking? Authorizing Provider  acetaminophen (TYLENOL) 325 MG tablet Take 650 mg by mouth every 6 (six) hours as needed for mild pain or moderate pain.    [provider]  cetirizine (ZYRTEC) 10 MG tablet Take 10 mg by mouth daily.    [provider]  famotidine (PEPCID) 40 MG tablet Take 40 mg by mouth 2 (two) times daily.    [provider]  hydrOXYzine (ATARAX/VISTARIL) 50 MG tablet Take 50 mg by mouth at bedtime.    [provider]  ibuprofen (ADVIL) 200 MG tablet Take 400 mg by mouth every 6 (six) hours as needed for headache or mild pain.    [provider]  lithium carbonate (LITHOBID) 300 MG CR tablet Take 1 tablet (300 mg total) by mouth 2 (two) times daily. 02/17/19 03/19/19  Elodia Florence., MD  OLANZapine (ZYPREXA) 10 MG tablet Take 10 mg by mouth daily.     [provider]  OLANZapine (ZYPREXA) 20 MG tablet Take 1 tablet (20 mg  total) by mouth at bedtime. 12/31/18 03/01/19  Cherylann Ratel A, DO  Valbenazine Tosylate (INGREZZA) 80 MG CAPS Take 1 tablet by mouth at bedtime.     [provider]    Allergies    Penicillins  Review of Systems   Review of Systems  Constitutional: Positive for fatigue. Negative for chills and fever.  HENT: Negative for congestion.   Respiratory: Negative for cough, chest tightness and shortness of breath.   Cardiovascular: Negative for chest pain, palpitations and leg swelling.  Gastrointestinal: Negative for abdominal pain, anal bleeding, diarrhea, nausea and vomiting.  Genitourinary: Negative for dysuria and urgency.  Musculoskeletal: Positive for myalgias.  Skin: Negative for color  change, rash and wound.  Neurological: Negative for dizziness, speech difficulty and headaches.  Psychiatric/Behavioral: Negative for confusion.    Physical Exam Updated Vital Signs BP (!) 159/87 (BP Location: Left Arm)   Pulse 74   Temp 97.9 F (36.6 C) (Oral)   Resp 17   SpO2 99%   Physical Exam Vitals and nursing note reviewed.  Constitutional:      General: She is not in acute distress.    Appearance: She is well-developed. She is not diaphoretic.  HENT:     Head: Normocephalic and atraumatic.     Mouth/Throat:     Mouth: Mucous membranes are moist.  Eyes:     General: No scleral icterus.       Right eye: No discharge.        Left eye: No discharge.     Conjunctiva/sclera: Conjunctivae normal.     Pupils: Pupils are equal, round, and reactive to light.  Cardiovascular:     Rate and Rhythm: Normal rate and regular rhythm.     Pulses: Normal pulses.     Heart sounds: Normal heart sounds.  Pulmonary:     Effort: Pulmonary effort is normal. No respiratory distress.     Breath sounds: No stridor.  Abdominal:     General: There is no distension.     Tenderness: There is no abdominal tenderness.  Musculoskeletal:        General: No deformity.     Cervical back: Normal range of motion and neck supple.     Right lower leg: No edema.     Left lower leg: No edema.  Skin:    General: Skin is warm and dry.  Neurological:     General: No focal deficit present.     Mental Status: She is alert and oriented to person, place, and time.     Motor: No abnormal muscle tone.     Comments: Patient is awake and alert, she is able to answer questions appropriately.  Speech is not slurred.  Smile is symmetrical.  Tongue protrusion is midline and symmetrical.  Coordination is normal.  She is able to ambulate without difficulty.    Psychiatric:        Mood and Affect: Mood normal.        Behavior: Behavior normal.     ED Results / Procedures / Treatments   Labs (all labs ordered  are listed, but only abnormal results are displayed) Labs Reviewed  CBC WITH DIFFERENTIAL/PLATELET - Abnormal; Notable for the following components:      Result Value   RBC 5.74 (*)    MCV 76.0 (*)    MCH 22.8 (*)    All other components within normal limits  COMPREHENSIVE METABOLIC PANEL - Abnormal; Notable for the following components:   CO2 20 (*)  Glucose, Bld 106 (*)    Total Protein 8.2 (*)    All other components within normal limits  URINALYSIS, ROUTINE W REFLEX MICROSCOPIC - Abnormal; Notable for the following components:   Color, Urine STRAW (*)    All other components within normal limits  CBG MONITORING, ED - Abnormal; Notable for the following components:   Glucose-Capillary 103 (*)    All other components within normal limits  SARS CORONAVIRUS 2 (TAT 6-24 HRS)  LITHIUM LEVEL  POC SARS CORONAVIRUS 2 AG -  ED    EKG EKG Interpretation  Date/Time:  Friday April 29 2019 22:24:55 EST Ventricular Rate:  66 PR Interval:    QRS Duration: 96 QT Interval:  414 QTC Calculation: 434 R Axis:   -54 Text Interpretation: Sinus rhythm Left anterior fascicular block Abnormal R-wave progression, early transition Borderline ST elevation, anterolateral leads No significant change since last tracing Confirmed by Orpah Greek [id number]) on 04/30/2019 12:42:14 AM   Radiology DG Chest Port 1 View  Result Date: 04/29/2019 CLINICAL DATA:  Hypotension fatigue body aches EXAM: PORTABLE CHEST 1 VIEW COMPARISON:  02/16/2019, 09/05/2018 FINDINGS: Mild diffuse interstitial opacity with patchy ground-glass opacities at both bases. No pleural effusion. Normal heart size. Aortic atherosclerosis. No pneumothorax. Punctate metallic fragments over the left shoulder. IMPRESSION: Increased interstitial and vague ground-glass opacity at the bases, possible bibasilar pneumonia, to include atypical or viral process. Electronically Signed   By: Donavan Foil M.D.   On: 04/29/2019 22:35     Procedures Procedures (including critical care time)  Medications Ordered in ED Medications - No data to display  ED Course  I have reviewed the triage vital signs and the nursing notes.  Pertinent labs & imaging results that were available during my care of the patient were reviewed by me and considered in my medical decision making (see chart for details).    MDM Rules/Calculators/A&P                     Patient presents today for evaluation of 2 weeks of body aches and fatigue.  Here she is 99% on room air.  She is mildly hypertensive, however is not tachycardic or tachypneic.  She reportedly had an episode while in the waiting room where she became hypotensive, however on repeat pressure when she was in her room it was normal.  Chest x-ray shows groundglass opacity at the bilateral lung bases concerning for Covid.  This would fit with her body aches and fatigue.  Outpatient coronavirus testing is sent and she is instructed to quarantine herself at home.  As she is not hypoxic she is not started on azithromycin or Decadron.  EKG is not significantly changed.  CMP and CBC without significant acute hematologic or electrolyte derangements.  Chart review did show that she was recently admitted for elevated lithium levels, lithium level was checked and was not significantly elevated above therapeutic.  She was able to ambulate to and from the bathroom without difficulty and was not significantly orthostatic.  Karen Best was evaluated in Emergency Department on 04/30/2019 for the symptoms described in the history of present illness. She was evaluated in the context of the global COVID-19 pandemic, which necessitated consideration that the patient might be at risk for infection with the SARS-CoV-2 virus that causes COVID-19. Institutional protocols and algorithms that pertain to the evaluation of patients at risk for COVID-19 are in a state of rapid change based on information released by  regulatory bodies  including the CDC and federal and state organizations. These policies and algorithms were followed during the patient's care in the ED.  Return precautions were discussed with patient who states their understanding.  At the time of discharge patient denied any unaddressed complaints or concerns.  Patient is agreeable for discharge home.  Note: Portions of this report may have been transcribed using voice recognition software. Every effort was made to ensure accuracy; however, inadvertent computerized transcription errors may be present  Final Clinical Impression(s) / ED Diagnoses Final diagnoses:  Suspected COVID-19 virus infection  Hypertension, unspecified type    Rx / DC Orders ED Discharge Orders    None       Lorin Glass, PA-C 04/30/19 0053    Lucrezia Starch, MD 05/02/19 1326

## 2019-05-19 ENCOUNTER — Ambulatory Visit: Payer: Medicaid Other | Admitting: Neurology

## 2019-05-19 ENCOUNTER — Telehealth: Payer: Self-pay | Admitting: Neurology

## 2019-05-19 ENCOUNTER — Encounter: Payer: Self-pay | Admitting: Neurology

## 2019-05-19 NOTE — Telephone Encounter (Signed)
This patient did not show for a revisit appointment today. 

## 2019-05-29 ENCOUNTER — Other Ambulatory Visit: Payer: Self-pay

## 2019-05-29 ENCOUNTER — Encounter (HOSPITAL_COMMUNITY): Payer: Self-pay | Admitting: Emergency Medicine

## 2019-05-29 ENCOUNTER — Emergency Department (HOSPITAL_COMMUNITY)
Admission: EM | Admit: 2019-05-29 | Discharge: 2019-05-30 | Disposition: A | Discharge status: Home / Self Care | Payer: Medicaid Other | Attending: Emergency Medicine | Admitting: Emergency Medicine

## 2019-05-29 DIAGNOSIS — F1410 Cocaine abuse, uncomplicated: Secondary | ICD-10-CM | POA: Diagnosis not present

## 2019-05-29 DIAGNOSIS — R110 Nausea: Secondary | ICD-10-CM | POA: Diagnosis present

## 2019-05-29 DIAGNOSIS — J45909 Unspecified asthma, uncomplicated: Secondary | ICD-10-CM | POA: Insufficient documentation

## 2019-05-29 DIAGNOSIS — R200 Anesthesia of skin: Secondary | ICD-10-CM | POA: Diagnosis not present

## 2019-05-29 DIAGNOSIS — Z79899 Other long term (current) drug therapy: Secondary | ICD-10-CM | POA: Diagnosis not present

## 2019-05-29 DIAGNOSIS — F17210 Nicotine dependence, cigarettes, uncomplicated: Secondary | ICD-10-CM | POA: Diagnosis not present

## 2019-05-29 DIAGNOSIS — R202 Paresthesia of skin: Secondary | ICD-10-CM

## 2019-05-29 LAB — CBC WITH DIFFERENTIAL/PLATELET
Abs Immature Granulocytes: 0.04 10*3/uL (ref 0.00–0.07)
Basophils Absolute: 0.1 10*3/uL (ref 0.0–0.1)
Basophils Relative: 1 %
Eosinophils Absolute: 0.5 10*3/uL (ref 0.0–0.5)
Eosinophils Relative: 5 %
HCT: 43.6 % (ref 36.0–46.0)
Hemoglobin: 13.4 g/dL (ref 12.0–15.0)
Immature Granulocytes: 0 %
Lymphocytes Relative: 34 %
Lymphs Abs: 3.1 10*3/uL (ref 0.7–4.0)
MCH: 22.9 pg — ABNORMAL LOW (ref 26.0–34.0)
MCHC: 30.7 g/dL (ref 30.0–36.0)
MCV: 74.5 fL — ABNORMAL LOW (ref 80.0–100.0)
Monocytes Absolute: 0.5 10*3/uL (ref 0.1–1.0)
Monocytes Relative: 6 %
Neutro Abs: 5 10*3/uL (ref 1.7–7.7)
Neutrophils Relative %: 54 %
Platelets: 392 10*3/uL (ref 150–400)
RBC: 5.85 MIL/uL — ABNORMAL HIGH (ref 3.87–5.11)
RDW: 15.3 % (ref 11.5–15.5)
WBC: 9.1 10*3/uL (ref 4.0–10.5)
nRBC: 0 % (ref 0.0–0.2)

## 2019-05-29 LAB — COMPREHENSIVE METABOLIC PANEL
ALT: 35 U/L (ref 0–44)
AST: 28 U/L (ref 15–41)
Albumin: 3.8 g/dL (ref 3.5–5.0)
Alkaline Phosphatase: 81 U/L (ref 38–126)
Anion gap: 8 (ref 5–15)
BUN: <5 mg/dL — ABNORMAL LOW (ref 8–23)
CO2: 21 mmol/L — ABNORMAL LOW (ref 22–32)
Calcium: 9.7 mg/dL (ref 8.9–10.3)
Chloride: 108 mmol/L (ref 98–111)
Creatinine, Ser: 0.71 mg/dL (ref 0.44–1.00)
GFR calc Af Amer: >60 mL/min (ref >60)
GFR calc non Af Amer: >60 mL/min (ref >60)
Glucose, Bld: 124 mg/dL — ABNORMAL HIGH (ref 70–99)
Potassium: 4.1 mmol/L (ref 3.5–5.1)
Sodium: 137 mmol/L (ref 135–145)
Total Bilirubin: 0.3 mg/dL (ref 0.3–1.2)
Total Protein: 8.6 g/dL — ABNORMAL HIGH (ref 6.5–8.1)

## 2019-05-29 LAB — TROPONIN I (HIGH SENSITIVITY): Troponin I (High Sensitivity): 3 ng/L (ref <18)

## 2019-05-29 NOTE — ED Provider Notes (Signed)
[facility] EMERGENCY DEPARTMENT Provider Note   CSN: [id number] Arrival date & time: 05/29/19  2113     History Chief Complaint  Patient presents with  . Nausea  . Numbness of arms/legs    Karen Best is a 64 y.o. female.  The history is provided by the patient.  Extremity Pain This is a recurrent problem. The current episode started more than 1 week ago (2 weeks). The problem occurs constantly. The problem has not changed since onset.Pertinent negatives include no chest pain, no abdominal pain, no headaches and no shortness of breath. Nothing aggravates the symptoms. Nothing relieves the symptoms. She has tried acetaminophen for the symptoms. The treatment provided no relief.  Patient with Shcizophrenia and fibromyalgia who presents with extremity pain x 2 weeks. She is only numb on the soles of the feet not the rest of the extremities.  No weakness.  No changes in vision or speech.  No f/c/r.  No covid contacts or symptoms.       Past Medical History:  Diagnosis Date  . Asthma   . Bipolar 1 disorder ([place])   . Fibromyalgia   . Schizophrenia ([place])   . Tobacco abuse     Patient Active Problem List   Diagnosis Date Noted  . Lithium toxicity 02/16/2019  . Bipolar 1 disorder ([place]) 02/16/2019  . Hyperkalemia 02/16/2019  . Acute confusion 12/29/2018  . Toxic metabolic encephalopathy [id number]  . Dysarthria 12/23/2018  . Elevated blood pressure reading 12/23/2018  . Asthma 12/23/2018  . Delirium due to another medical condition   . UTI (urinary tract infection) 12/06/2018  . Acute metabolic encephalopathy [id number]  . 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 Psychosis ([place]) 09/16/2013  . Schizophrenia [facility])     Past Surgical History:  Procedure Laterality Date  . CESAREAN SECTION    . TUBAL LIGATION       OB History   No obstetric history on file.     Family History  Problem Relation Age of Onset  . Liver cancer Neg Hx   .  Liver disease Neg Hx     Social History   Tobacco Use  . Smoking status: Current Every Day Smoker    Packs/day: 0.50    Types: Cigarettes  . Smokeless tobacco: Never Used  Substance Use Topics  . Alcohol use: Yes    Comment: occ  . Drug use: Yes    Types: Cocaine    Comment: crack cocaine (last use 10/25/2018)    Home Medications Prior to Admission medications   Medication Sig Start Date End Date Taking? Authorizing Provider  acetaminophen (TYLENOL) 325 MG tablet Take 650 mg by mouth every 6 (six) hours as needed for mild pain or moderate pain.    [provider]  cetirizine (ZYRTEC) 10 MG tablet Take 10 mg by mouth daily.    [provider]  famotidine (PEPCID) 40 MG tablet Take 40 mg by mouth 2 (two) times daily.    [provider]  hydrOXYzine (ATARAX/VISTARIL) 50 MG tablet Take 50 mg by mouth at bedtime.    [provider]  ibuprofen (ADVIL) 200 MG tablet Take 400 mg by mouth every 6 (six) hours as needed for headache or mild pain.    [provider]  lithium carbonate (LITHOBID) 300 MG CR tablet Take 1 tablet (300 mg total) by mouth 2 (two) times daily. 02/17/19 03/19/19  Elodia Florence., MD  OLANZapine (ZYPREXA) 10 MG tablet Take  10 mg by mouth daily.     [provider]  OLANZapine (ZYPREXA) 20 MG tablet Take 1 tablet (20 mg total) by mouth at bedtime. 12/31/18 03/01/19  Cherylann Ratel A, DO  Valbenazine Tosylate (INGREZZA) 80 MG CAPS Take 1 tablet by mouth at bedtime.     [provider]    Allergies    Penicillins  Review of Systems   Review of Systems  Constitutional: Negative for fever.  HENT: Negative for congestion.   Eyes: Negative for visual disturbance.  Respiratory: Negative for shortness of breath.   Cardiovascular: Negative for chest pain.  Gastrointestinal: Negative for abdominal pain.  Genitourinary: Negative for difficulty urinating.  Musculoskeletal: Positive for arthralgias. Negative  for back pain and gait problem.  Neurological: Negative for dizziness, facial asymmetry, speech difficulty, weakness and headaches.  All other systems reviewed and are negative.   Physical Exam Updated Vital Signs BP (!) 150/86   Pulse 94   Temp 98.2 F (36.8 C)   Resp 17   SpO2 98%   Physical Exam Vitals and nursing note reviewed.  Constitutional:      General: She is not in acute distress.    Appearance: Normal appearance.  HENT:     Head: Normocephalic and atraumatic.     Nose: Nose normal.  Eyes:     Conjunctiva/sclera: Conjunctivae normal.     Pupils: Pupils are equal, round, and reactive to light.  Cardiovascular:     Rate and Rhythm: Normal rate and regular rhythm.     Pulses: Normal pulses.     Heart sounds: Normal heart sounds.  Pulmonary:     Effort: Pulmonary effort is normal.     Breath sounds: Normal breath sounds.  Abdominal:     General: Abdomen is flat. Bowel sounds are normal.     Tenderness: There is no abdominal tenderness. There is no guarding or rebound.  Musculoskeletal:        General: Normal range of motion.     Cervical back: Normal range of motion.  Skin:    General: Skin is warm and dry.     Capillary Refill: Capillary refill takes less than 2 seconds.  Neurological:     General: No focal deficit present.     Mental Status: She is alert and oriented to person, place, and time.     Cranial Nerves: No cranial nerve deficit.     Sensory: No sensory deficit.     Motor: No weakness.     Deep Tendon Reflexes: Reflexes normal.  Psychiatric:        Thought Content: Thought content normal.     ED Results / Procedures / Treatments   Labs (all labs ordered are listed, but only abnormal results are displayed) Results for orders placed or performed during the hospital encounter of 05/29/19  CBC with Differential  Result Value Ref Range   WBC 9.1 4.0 - 10.5 K/uL   RBC 5.85 (H) 3.87 - 5.11 MIL/uL   Hemoglobin 13.4 12.0 - 15.0 g/dL   HCT 43.6  36.0 - 46.0 %   MCV 74.5 (L) 80.0 - 100.0 fL   MCH 22.9 (L) 26.0 - 34.0 pg   MCHC 30.7 30.0 - 36.0 g/dL   RDW 15.3 11.5 - 15.5 %   Platelets 392 150 - 400 K/uL   nRBC 0.0 0.0 - 0.2 %   Neutrophils Relative % 54 %   Neutro Abs 5.0 1.7 - 7.7 K/uL   Lymphocytes Relative 34 %  Lymphs Abs 3.1 0.7 - 4.0 K/uL   Monocytes Relative 6 %   Monocytes Absolute 0.5 0.1 - 1.0 K/uL   Eosinophils Relative 5 %   Eosinophils Absolute 0.5 0.0 - 0.5 K/uL   Basophils Relative 1 %   Basophils Absolute 0.1 0.0 - 0.1 K/uL   Immature Granulocytes 0 %   Abs Immature Granulocytes 0.04 0.00 - 0.07 K/uL  Comprehensive metabolic panel  Result Value Ref Range   Sodium 137 135 - 145 mmol/L   Potassium 4.1 3.5 - 5.1 mmol/L   Chloride 108 98 - 111 mmol/L   CO2 21 (L) 22 - 32 mmol/L   Glucose, Bld 124 (H) 70 - 99 mg/dL   BUN <5 (L) 8 - 23 mg/dL   Creatinine, Ser 0.71 0.44 - 1.00 mg/dL   Calcium 9.7 8.9 - 10.3 mg/dL   Total Protein 8.6 (H) 6.5 - 8.1 g/dL   Albumin 3.8 3.5 - 5.0 g/dL   AST 28 15 - 41 U/L   ALT 35 0 - 44 U/L   Alkaline Phosphatase 81 38 - 126 U/L   Total Bilirubin 0.3 0.3 - 1.2 mg/dL   GFR calc non Af Amer >60 >60 mL/min   GFR calc Af Amer >60 >60 mL/min   Anion gap 8 5 - 15   No results found.  EKG EKG Interpretation  Date/Time:  Sunday May 29 2019 21:15:55 EST Ventricular Rate:  84 PR Interval:  142 QRS Duration: 88 QT Interval:  364 QTC Calculation: 430 R Axis:   -53 Text Interpretation: Normal sinus rhythm Left axis deviation Confirmed by Randal Buba, Roselina Burgueno ([id number]) on 05/29/2019 11:05:37 PM   Radiology No results found.  Procedures Procedures (including critical care time)  Medications Ordered in ED Medications - No data to display  ED Course  I have reviewed the triage vital signs and the nursing notes.  Pertinent labs & imaging results that were available during my care of the patient were reviewed by me and considered in my medical decision making (see chart for  details).   Walking and PO challenging in the department. She has not weakness and sensation is intact to confrontation in all nerve distributions on exam.  No signs of stroke or toxicologic emergency.  I suspect this is fibromyalgia flair as the patient states this is pain not numbness in all extremities.    Karen Best was evaluated in Emergency Department on 05/30/2019 for the symptoms described in the history of present illness. She was evaluated in the context of the global COVID-19 pandemic, which necessitated consideration that the patient might be at risk for infection with the SARS-CoV-2 virus that causes COVID-19. Institutional protocols and algorithms that pertain to the evaluation of patients at risk for COVID-19 are in a state of rapid change based on information released by regulatory bodies including the CDC and federal and state organizations. These policies and algorithms were followed during the patient's care in the ED.  Final Clinical Impression(s) / ED Diagnoses Return for weakness, numbness, changes in vision or speech, fevers >100.4 unrelieved by medication, shortness of breath, intractable vomiting, or diarrhea, abdominal pain, Inability to tolerate liquids or food, cough, altered mental status or any concerns. No signs of systemic illness or infection. The patient is nontoxic-appearing on exam and vital signs are within normal limits.   I have reviewed the triage vital signs and the nursing notes. Pertinent labs &imaging results that were available during my care of the patient were reviewed by  me and considered in my medical decision making (see chart for details).  After history, exam, and medical workup I feel the patient has been appropriately medically screened and is safe for discharge home. Pertinent diagnoses were discussed with the patient. Patient was given return precautions   Konrad Hoak, MD 05/30/19 0530

## 2019-05-29 NOTE — ED Notes (Signed)
RN called lab, will add on Trop.

## 2019-05-29 NOTE — ED Triage Notes (Signed)
Patient arrived with [facility] from home reports nausea with intermittent arms/leg numbness onset 3 days ago , alert and oriented , denies pain/respirations unlabored , no fever or emesis .

## 2019-05-30 ENCOUNTER — Emergency Department (HOSPITAL_COMMUNITY): Payer: Medicaid Other

## 2019-05-30 ENCOUNTER — Encounter (HOSPITAL_COMMUNITY): Payer: Self-pay | Admitting: Emergency Medicine

## 2019-05-30 LAB — RAPID URINE DRUG SCREEN, HOSP PERFORMED
Amphetamines: NOT DETECTED
Barbiturates: NOT DETECTED
Benzodiazepines: NOT DETECTED
Cocaine: NOT DETECTED
Opiates: NOT DETECTED
Tetrahydrocannabinol: NOT DETECTED

## 2019-05-30 LAB — TROPONIN I (HIGH SENSITIVITY): Troponin I (High Sensitivity): 2 ng/L (ref <18)

## 2019-05-30 LAB — URINALYSIS, ROUTINE W REFLEX MICROSCOPIC
Bacteria, UA: NONE SEEN
Bilirubin Urine: NEGATIVE
Glucose, UA: NEGATIVE mg/dL
Ketones, ur: NEGATIVE mg/dL
Leukocytes,Ua: NEGATIVE
Nitrite: NEGATIVE
Protein, ur: 30 mg/dL — AB
Specific Gravity, Urine: 1.008 (ref 1.005–1.030)
pH: 6 (ref 5.0–8.0)

## 2019-05-30 LAB — SALICYLATE LEVEL
Salicylate Lvl: 28.6 mg/dL (ref 7.0–30.0)
Salicylate Lvl: 22.5 mg/dL (ref 7.0–30.0)

## 2019-05-30 LAB — ACETAMINOPHEN LEVEL
Acetaminophen (Tylenol), Serum: <10 ug/mL — ABNORMAL LOW (ref 10–30)
Acetaminophen (Tylenol), Serum: <10 ug/mL — ABNORMAL LOW (ref 10–30)

## 2019-05-30 LAB — LITHIUM LEVEL: Lithium Lvl: 0.51 mmol/L — ABNORMAL LOW (ref 0.60–1.20)

## 2019-05-30 MED ORDER — ACETAMINOPHEN 500 MG PO TABS
1000.0000 mg | ORAL_TABLET | Freq: Once | ORAL | Status: AC
  Administered 2019-05-30: 1000 mg via ORAL
  Filled 2019-05-30: qty 2

## 2019-05-30 NOTE — ED Notes (Addendum)
Pt ambulated to the restroom with no assistance. No complications noted while ambulating.

## 2019-05-30 NOTE — ED Notes (Signed)
Pt advised that she was having issues swallowing. This NT provided the pt with advice about drinking while laying down.Pt was sat up to siting position. This tech witnessed pt drink water with no problems. Pt requested more PO fluids.

## 2019-05-30 NOTE — ED Notes (Signed)
Pt ambulated to RR7, NAD noted

## 2019-06-08 ENCOUNTER — Other Ambulatory Visit: Payer: Self-pay | Admitting: Family Medicine

## 2019-06-08 ENCOUNTER — Other Ambulatory Visit: Payer: Self-pay | Admitting: Nurse Practitioner

## 2019-06-08 DIAGNOSIS — Z1231 Encounter for screening mammogram for malignant neoplasm of breast: Secondary | ICD-10-CM

## 2019-06-10 ENCOUNTER — Other Ambulatory Visit: Payer: Self-pay

## 2019-06-10 ENCOUNTER — Emergency Department (HOSPITAL_COMMUNITY)
Admission: EM | Admit: 2019-06-10 | Discharge: 2019-06-10 | Disposition: A | Discharge status: Home / Self Care | Payer: Medicaid Other | Attending: Emergency Medicine | Admitting: Emergency Medicine

## 2019-06-10 DIAGNOSIS — M545 Low back pain, unspecified: Secondary | ICD-10-CM

## 2019-06-10 DIAGNOSIS — F250 Schizoaffective disorder, bipolar type: Secondary | ICD-10-CM | POA: Insufficient documentation

## 2019-06-10 DIAGNOSIS — Z87440 Personal history of urinary (tract) infections: Secondary | ICD-10-CM | POA: Insufficient documentation

## 2019-06-10 DIAGNOSIS — R350 Frequency of micturition: Secondary | ICD-10-CM | POA: Insufficient documentation

## 2019-06-10 DIAGNOSIS — F17210 Nicotine dependence, cigarettes, uncomplicated: Secondary | ICD-10-CM | POA: Insufficient documentation

## 2019-06-10 DIAGNOSIS — Z79899 Other long term (current) drug therapy: Secondary | ICD-10-CM | POA: Diagnosis not present

## 2019-06-10 LAB — URINALYSIS, ROUTINE W REFLEX MICROSCOPIC
Bilirubin Urine: NEGATIVE
Glucose, UA: NEGATIVE mg/dL
Hgb urine dipstick: NEGATIVE
Ketones, ur: NEGATIVE mg/dL
Leukocytes,Ua: NEGATIVE
Nitrite: NEGATIVE
Protein, ur: NEGATIVE mg/dL
Specific Gravity, Urine: 1.008 (ref 1.005–1.030)
pH: 6 (ref 5.0–8.0)

## 2019-06-10 MED ORDER — METHOCARBAMOL 500 MG PO TABS
500.0000 mg | ORAL_TABLET | Freq: Once | ORAL | Status: AC
  Administered 2019-06-10: 14:00:00 500 mg via ORAL
  Filled 2019-06-10: qty 1

## 2019-06-10 MED ORDER — LIDOCAINE 5 % EX PTCH
2.0000 | MEDICATED_PATCH | CUTANEOUS | Status: DC
  Administered 2019-06-10: 14:00:00 1 via TRANSDERMAL
  Filled 2019-06-10: qty 2

## 2019-06-10 MED ORDER — LIDOCAINE 5 % EX PTCH: 1.0000 | MEDICATED_PATCH | CUTANEOUS | 0 refills | Status: DC

## 2019-06-10 MED ORDER — ACETAMINOPHEN 500 MG PO TABS
1000.0000 mg | ORAL_TABLET | Freq: Once | ORAL | Status: AC
  Administered 2019-06-10: 14:00:00 1000 mg via ORAL
  Filled 2019-06-10: qty 2

## 2019-06-10 MED ORDER — METHOCARBAMOL 500 MG PO TABS: 500.0000 mg | ORAL_TABLET | Freq: Two times a day (BID) | ORAL | 0 refills | Status: DC

## 2019-06-10 NOTE — ED Triage Notes (Signed)
Pt to ED via [facility] from home -- independent living in a special needs community-- c/o back pain. Denies urinary symptoms

## 2019-06-10 NOTE — Discharge Instructions (Signed)
In addition to Tylenol you can use prescribed Robaxin as well as Lidoderm patches to treat back pain.  Your urinalysis does not show any signs of infection.  Please follow-up with your primary care doctor if back pain is not improving.

## 2019-06-10 NOTE — ED Provider Notes (Signed)
[facility] EMERGENCY DEPARTMENT Provider Note   CSN: [id number] Arrival date & time: 06/10/19  1256     History Chief Complaint  Patient presents with  . Back Pain    Karen Best is a 64 y.o. female.  Karen Best is a 64 y.o. female with a history of schizophrenia, bipolar disorder, asthma, fibromyalgia, who presents to the ED via EMS from independent living special-needs community complaining of back pain.  Patient reports pain across her low back has been present over the past 2 weeks.  She reports it is worse with movement and palpation.  Pain does not radiate into the legs or abdomen.  She denies any associated numbness or weakness in her lower extremities and she is ambulatory without difficulty.  No loss of bowel or bladder control or saddle anesthesia.  Denies any associated abdominal pains, fevers, nausea, vomiting.  Denies any history of cancer or IV drug use.  Reports that a week or 2 ago she was treated for a UTI and reports overall her symptoms have improved, she still has some urinary frequency occasionally.  No history of back surgeries.  Dates she is used Tylenol and ibuprofen without improvement.  No other aggravating or alleviating factors.        Past Medical History:  Diagnosis Date  . Asthma   . Bipolar 1 disorder ([place])   . Fibromyalgia   . Schizophrenia ([place])   . Tobacco abuse     Patient Active Problem List   Diagnosis Date Noted  . Lithium toxicity 02/16/2019  . Bipolar 1 disorder ([place]) 02/16/2019  . Hyperkalemia 02/16/2019  . Acute confusion 12/29/2018  . Toxic metabolic encephalopathy Q000111Q  . Dysarthria 12/23/2018  . Elevated blood pressure reading 12/23/2018  . Asthma 12/23/2018  . Delirium due to another medical condition   . UTI (urinary tract infection) 12/06/2018  . Acute metabolic encephalopathy 123XX123  . 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 Psychosis ([place]) 09/16/2013  . Schizophrenia  [facility])     Past Surgical History:  Procedure Laterality Date  . CESAREAN SECTION    . TUBAL LIGATION       OB History   No obstetric history on file.     Family History  Problem Relation Age of Onset  . Liver cancer Neg Hx   . Liver disease Neg Hx     Social History   Tobacco Use  . Smoking status: Current Every Day Smoker    Packs/day: 0.50    Types: Cigarettes  . Smokeless tobacco: Never Used  Substance Use Topics  . Alcohol use: Yes    Comment: occ  . Drug use: Yes    Types: Cocaine    Comment: crack cocaine (last use 10/25/2018)    Home Medications Prior to Admission medications   Medication Sig Start Date End Date Taking? Authorizing Provider  acetaminophen (TYLENOL) 325 MG tablet Take 650 mg by mouth every 6 (six) hours as needed for mild pain or moderate pain.    [provider]  cetirizine (ZYRTEC) 10 MG tablet Take 10 mg by mouth daily.    [provider]  famotidine (PEPCID) 40 MG tablet Take 40 mg by mouth 2 (two) times daily.    [provider]  hydrOXYzine (ATARAX/VISTARIL) 50 MG tablet Take 50 mg by mouth at bedtime.    [provider]  ibuprofen (ADVIL) 200 MG tablet Take 400 mg by mouth every 6 (six) hours as needed for headache  or mild pain.    [provider]  lidocaine (LIDODERM) 5 % Place 1 patch onto the skin daily. Remove & Discard patch within 12 hours or as directed by MD 06/10/19   Jacqlyn Larsen, PA-C  lithium carbonate (LITHOBID) 300 MG CR tablet Take 1 tablet (300 mg total) by mouth 2 (two) times daily. 02/17/19 03/19/19  Elodia Florence., MD  methocarbamol (ROBAXIN) 500 MG tablet Take 1 tablet (500 mg total) by mouth 2 (two) times daily. 06/10/19   Jacqlyn Larsen, PA-C  OLANZapine (ZYPREXA) 10 MG tablet Take 10 mg by mouth daily.     [provider]  OLANZapine (ZYPREXA) 20 MG tablet Take 1 tablet (20 mg total) by mouth at bedtime. 12/31/18 03/01/19  Cherylann Ratel A, DO  Valbenazine  Tosylate (INGREZZA) 80 MG CAPS Take 1 tablet by mouth at bedtime.     [provider]    Allergies    Penicillins  Review of Systems   Review of Systems  Constitutional: Negative for chills and fever.  HENT: Negative.   Respiratory: Negative for shortness of breath.   Cardiovascular: Negative for chest pain.  Gastrointestinal: Negative for abdominal pain, constipation, diarrhea, nausea and vomiting.  Genitourinary: Positive for frequency. Negative for dysuria, flank pain and hematuria.  Musculoskeletal: Positive for back pain. Negative for arthralgias, gait problem, joint swelling, myalgias and neck pain.  Skin: Negative for color change, rash and wound.  Neurological: Negative for weakness and numbness.    Physical Exam Updated Vital Signs BP (!) 152/92 (BP Location: Left Arm)   Pulse 88   Temp 98.1 F (36.7 C) (Oral)   Resp 16   SpO2 100%   Physical Exam Vitals and nursing note reviewed.  Constitutional:      General: She is not in acute distress.    Appearance: She is well-developed. She is not diaphoretic.  HENT:     Head: Atraumatic.  Eyes:     General:        Right eye: No discharge.        Left eye: No discharge.  Cardiovascular:     Pulses:          Radial pulses are 2+ on the right side and 2+ on the left side.       Dorsalis pedis pulses are 2+ on the right side and 2+ on the left side.       Posterior tibial pulses are 2+ on the right side and 2+ on the left side.  Pulmonary:     Effort: Pulmonary effort is normal. No respiratory distress.  Abdominal:     General: Bowel sounds are normal. There is no distension.     Palpations: Abdomen is soft. There is no mass.     Tenderness: There is no abdominal tenderness. There is no guarding.     Comments: Abdomen soft, nondistended, nontender to palpation in all quadrants without guarding or peritoneal signs, no CVA tenderness bilaterally  Musculoskeletal:     Cervical back: Neck supple.     Comments:  Back pain is consistently reproducible with palpation over bilateral lumbar back musculature, no focal midline tenderness or palpable deformity.  No overlying skin changes.  Pain is also made worse with range of motion.  Negative straight leg raise bilaterally.  Patient has normal range of motion of lower extremities and is ambulatory  Skin:    General: Skin is warm and dry.     Capillary Refill: Capillary refill takes  less than 2 seconds.  Neurological:     Mental Status: She is alert and oriented to person, place, and time.     Comments: Alert, clear speech, following commands. Moving all extremities without difficulty. Bilateral lower extremities with 5/5 strength in proximal and distal muscle groups and with dorsi and plantar flexion. Sensation intact in bilateral lower extremities. 2+ patellar DTRs bilaterally. Ambulatory with steady gait  Psychiatric:        Behavior: Behavior normal.     ED Results / Procedures / Treatments   Labs (all labs ordered are listed, but only abnormal results are displayed) Labs Reviewed  URINE CULTURE  URINALYSIS, ROUTINE W REFLEX MICROSCOPIC    EKG None  Radiology No results found.  Procedures Procedures (including critical care time)  Medications Ordered in ED Medications  lidocaine (LIDODERM) 5 % 2 patch (1 patch Transdermal Patch Applied 06/10/19 1352)  acetaminophen (TYLENOL) tablet 1,000 mg (1,000 mg Oral Given 06/10/19 1351)  methocarbamol (ROBAXIN) tablet 500 mg (500 mg Oral Given 06/10/19 1351)    ED Course  I have reviewed the triage vital signs and the nursing notes.  Pertinent labs & imaging results that were available during my care of the patient were reviewed by me and considered in my medical decision making (see chart for details).    MDM Rules/Calculators/A&P                      Patient with back pain.  No neurological deficits and normal neuro exam.  Patient can walk but states is painful.  No loss of bowel or  bladder control.  No concern for cauda equina.  No fever, night sweats, weight loss, h/o cancer, IVDU.  She had reported recent treatment for UTI and some frequency, but UA is negative for infection and pain is consistently reproducible with palpation, I suspect musculoskeletal back pain.  RICE protocol and pain medicine indicated and discussed with patient.   Final Clinical Impression(s) / ED Diagnoses Final diagnoses:  Acute bilateral low back pain without sciatica    Rx / DC Orders ED Discharge Orders         Ordered    methocarbamol (ROBAXIN) 500 MG tablet  2 times daily     06/10/19 1441    lidocaine (LIDODERM) 5 %  Every 24 hours     06/10/19 1441           Janet Berlin 06/10/19 1447    Davonna Belling, MD 06/10/19 1540

## 2019-06-11 LAB — URINE CULTURE: Culture: NO GROWTH

## 2019-06-13 ENCOUNTER — Ambulatory Visit: Payer: Medicaid Other

## 2019-06-15 ENCOUNTER — Other Ambulatory Visit: Payer: Self-pay

## 2019-06-15 ENCOUNTER — Emergency Department (HOSPITAL_COMMUNITY)
Admission: EM | Admit: 2019-06-15 | Discharge: 2019-06-15 | Disposition: A | Discharge status: Home / Self Care | Payer: Medicaid Other | Attending: Emergency Medicine | Admitting: Emergency Medicine

## 2019-06-15 ENCOUNTER — Emergency Department (HOSPITAL_COMMUNITY): Payer: Medicaid Other

## 2019-06-15 DIAGNOSIS — M5442 Lumbago with sciatica, left side: Secondary | ICD-10-CM | POA: Insufficient documentation

## 2019-06-15 DIAGNOSIS — M545 Low back pain: Secondary | ICD-10-CM | POA: Diagnosis present

## 2019-06-15 DIAGNOSIS — M5441 Lumbago with sciatica, right side: Secondary | ICD-10-CM | POA: Diagnosis not present

## 2019-06-15 LAB — CBC WITH DIFFERENTIAL/PLATELET
Abs Immature Granulocytes: 0.02 10*3/uL (ref 0.00–0.07)
Basophils Absolute: 0 10*3/uL (ref 0.0–0.1)
Basophils Relative: 1 %
Eosinophils Absolute: 0.6 10*3/uL — ABNORMAL HIGH (ref 0.0–0.5)
Eosinophils Relative: 10 %
HCT: 42.2 % (ref 36.0–46.0)
Hemoglobin: 12.3 g/dL (ref 12.0–15.0)
Immature Granulocytes: 0 %
Lymphocytes Relative: 36 %
Lymphs Abs: 2 10*3/uL (ref 0.7–4.0)
MCH: 22.9 pg — ABNORMAL LOW (ref 26.0–34.0)
MCHC: 29.1 g/dL — ABNORMAL LOW (ref 30.0–36.0)
MCV: 78.6 fL — ABNORMAL LOW (ref 80.0–100.0)
Monocytes Absolute: 0.5 10*3/uL (ref 0.1–1.0)
Monocytes Relative: 9 %
Neutro Abs: 2.4 10*3/uL (ref 1.7–7.7)
Neutrophils Relative %: 44 %
Platelets: 320 10*3/uL (ref 150–400)
RBC: 5.37 MIL/uL — ABNORMAL HIGH (ref 3.87–5.11)
RDW: 17.4 % — ABNORMAL HIGH (ref 11.5–15.5)
WBC: 5.5 10*3/uL (ref 4.0–10.5)
nRBC: 0 % (ref 0.0–0.2)

## 2019-06-15 LAB — COMPREHENSIVE METABOLIC PANEL
ALT: 23 U/L (ref 0–44)
AST: 24 U/L (ref 15–41)
Albumin: 3.5 g/dL (ref 3.5–5.0)
Alkaline Phosphatase: 62 U/L (ref 38–126)
Anion gap: 7 (ref 5–15)
BUN: 11 mg/dL (ref 8–23)
CO2: 21 mmol/L — ABNORMAL LOW (ref 22–32)
Calcium: 9.7 mg/dL (ref 8.9–10.3)
Chloride: 110 mmol/L (ref 98–111)
Creatinine, Ser: 0.89 mg/dL (ref 0.44–1.00)
GFR calc Af Amer: >60 mL/min (ref >60)
GFR calc non Af Amer: >60 mL/min (ref >60)
Glucose, Bld: 96 mg/dL (ref 70–99)
Potassium: 4.5 mmol/L (ref 3.5–5.1)
Sodium: 138 mmol/L (ref 135–145)
Total Bilirubin: 0.4 mg/dL (ref 0.3–1.2)
Total Protein: 7.3 g/dL (ref 6.5–8.1)

## 2019-06-15 LAB — URINALYSIS, ROUTINE W REFLEX MICROSCOPIC
Bilirubin Urine: NEGATIVE
Glucose, UA: NEGATIVE mg/dL
Hgb urine dipstick: NEGATIVE
Ketones, ur: NEGATIVE mg/dL
Leukocytes,Ua: NEGATIVE
Nitrite: NEGATIVE
Protein, ur: NEGATIVE mg/dL
Specific Gravity, Urine: 1.009 (ref 1.005–1.030)
pH: 6 (ref 5.0–8.0)

## 2019-06-15 MED ORDER — CYCLOBENZAPRINE HCL 10 MG PO TABS: 10.0000 mg | ORAL_TABLET | Freq: Three times a day (TID) | ORAL | 0 refills | Status: DC | PRN

## 2019-06-15 MED ORDER — OXYCODONE-ACETAMINOPHEN 5-325 MG PO TABS
1.0000 | ORAL_TABLET | Freq: Once | ORAL | Status: AC
  Administered 2019-06-15: 1 via ORAL
  Filled 2019-06-15: qty 1

## 2019-06-15 NOTE — ED Triage Notes (Signed)
Came in via EMS from home; c/o bilateral flank pain 8/10; EMS endorsed hx of chronic back pain as well.

## 2019-06-15 NOTE — Discharge Instructions (Addendum)
Please follow-up with primary doctor regarding symptoms you are experiencing today.  Return to ER if pain worsens, you develop any fever, abdominal pain, other new concerning symptom.  Recommend anti-inflammatory such as naproxen or Motrin, Tylenol and muscle relaxer for pain control.

## 2019-06-15 NOTE — ED Notes (Signed)
Pt ambulated self efficiently to room with ems.

## 2019-06-15 NOTE — ED Provider Notes (Signed)
[facility] EMERGENCY DEPARTMENT Provider Note   CSN: [id number] Arrival date & time: 06/15/19  0809     History Chief Complaint  Patient presents with  . Flank Pain    BILATERAL    Karen Best is a 64 y.o. female.  Presents to ER with low back pain.  Patient states has been having pain for the last 2 or 3 weeks, worse in her low back, both sides, does not radiate down the legs.  Reports similar to last presentation.  Did not take any of the medicine that was prescribed.  Had been using Tylenol at home with minimal relief.  No associated numbness, weakness, bladder or bowel incontinence, urinary retention.  No history IVDU.  HPI     Past Medical History:  Diagnosis Date  . Asthma   . Bipolar 1 disorder ([place])   . Fibromyalgia   . Schizophrenia ([place])   . Tobacco abuse     Patient Active Problem List   Diagnosis Date Noted  . Lithium toxicity 02/16/2019  . Bipolar 1 disorder ([place]) 02/16/2019  . Hyperkalemia 02/16/2019  . Acute confusion 12/29/2018  . Toxic metabolic encephalopathy [id number]  . Dysarthria 12/23/2018  . Elevated blood pressure reading 12/23/2018  . Asthma 12/23/2018  . Delirium due to another medical condition   . UTI (urinary tract infection) 12/06/2018  . Acute metabolic encephalopathy [id number]  . 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 Psychosis ([place]) 09/16/2013  . Schizophrenia [facility])     Past Surgical History:  Procedure Laterality Date  . CESAREAN SECTION    . TUBAL LIGATION       OB History   No obstetric history on file.     Family History  Problem Relation Age of Onset  . Liver cancer Neg Hx   . Liver disease Neg Hx     Social History   Tobacco Use  . Smoking status: Current Every Day Smoker    Packs/day: 0.50    Types: Cigarettes  . Smokeless tobacco: Never Used  Substance Use Topics  . Alcohol use: Yes    Comment: occ  . Drug use: Yes    Types: Cocaine    Comment: crack cocaine (last  use 10/25/2018)    Home Medications Prior to Admission medications   Medication Sig Start Date End Date Taking? Authorizing Provider  acetaminophen (TYLENOL) 325 MG tablet Take 650 mg by mouth every 6 (six) hours as needed for mild pain or moderate pain.    [provider]  cetirizine (ZYRTEC) 10 MG tablet Take 10 mg by mouth daily.    [provider]  cyclobenzaprine (FLEXERIL) 10 MG tablet Take 1 tablet (10 mg total) by mouth 3 (three) times daily as needed for muscle spasms. 06/15/19   Lucrezia Starch, MD  famotidine (PEPCID) 40 MG tablet Take 40 mg by mouth 2 (two) times daily.    [provider]  hydrOXYzine (ATARAX/VISTARIL) 50 MG tablet Take 50 mg by mouth at bedtime.    [provider]  ibuprofen (ADVIL) 200 MG tablet Take 400 mg by mouth every 6 (six) hours as needed for headache or mild pain.    [provider]  lidocaine (LIDODERM) 5 % Place 1 patch onto the skin daily. Remove & Discard patch within 12 hours or as directed by MD 06/10/19   Jacqlyn Larsen, PA-C  lithium carbonate (LITHOBID) 300 MG CR tablet Take 1 tablet (300 mg total) by mouth 2 (two) times  daily. 02/17/19 03/19/19  Elodia Florence., MD  methocarbamol (ROBAXIN) 500 MG tablet Take 1 tablet (500 mg total) by mouth 2 (two) times daily. 06/10/19   Jacqlyn Larsen, PA-C  OLANZapine (ZYPREXA) 10 MG tablet Take 10 mg by mouth daily.     [provider]  OLANZapine (ZYPREXA) 20 MG tablet Take 1 tablet (20 mg total) by mouth at bedtime. 12/31/18 03/01/19  Cherylann Ratel A, DO  Valbenazine Tosylate (INGREZZA) 80 MG CAPS Take 1 tablet by mouth at bedtime.     [provider]    Allergies    Penicillins  Review of Systems   Review of Systems  Constitutional: Negative for chills and fever.  HENT: Negative for ear pain and sore throat.   Eyes: Negative for pain and visual disturbance.  Respiratory: Negative for cough and shortness of breath.   Cardiovascular:  Negative for chest pain and palpitations.  Gastrointestinal: Negative for abdominal pain and vomiting.  Genitourinary: Negative for dysuria and hematuria.  Musculoskeletal: Positive for back pain. Negative for arthralgias.  Skin: Negative for color change and rash.  Neurological: Negative for seizures and syncope.  All other systems reviewed and are negative.   Physical Exam Updated Vital Signs BP 139/80   Pulse 60   Temp 98.3 F (36.8 C) (Oral)   Resp 18   Ht 5' 4.96" (1.65 m)   Wt 68 kg   SpO2 100%   BMI 24.99 kg/m   Physical Exam Vitals and nursing note reviewed.  Constitutional:      General: She is not in acute distress.    Appearance: She is well-developed.  HENT:     Head: Normocephalic and atraumatic.  Eyes:     Conjunctiva/sclera: Conjunctivae normal.  Cardiovascular:     Rate and Rhythm: Normal rate and regular rhythm.     Heart sounds: No murmur.  Pulmonary:     Effort: Pulmonary effort is normal. No respiratory distress.     Breath sounds: Normal breath sounds.  Abdominal:     Palpations: Abdomen is soft.     Tenderness: There is no abdominal tenderness.  Musculoskeletal:     Cervical back: Neck supple.     Comments: TTP across low back, no deformity, no stepoff  Skin:    General: Skin is warm and dry.  Neurological:     General: No focal deficit present.     Mental Status: She is alert and oriented to person, place, and time.     Comments: 5/5 strength in b/l LE Sensation to light touch intact in LEs     ED Results / Procedures / Treatments   Labs (all labs ordered are listed, but only abnormal results are displayed) Labs Reviewed  CBC WITH DIFFERENTIAL/PLATELET - Abnormal; Notable for the following components:      Result Value   RBC 5.37 (*)    MCV 78.6 (*)    MCH 22.9 (*)    MCHC 29.1 (*)    RDW 17.4 (*)    Eosinophils Absolute 0.6 (*)    All other components within normal limits  COMPREHENSIVE METABOLIC PANEL - Abnormal; Notable for  the following components:   CO2 21 (*)    All other components within normal limits  URINALYSIS, ROUTINE W REFLEX MICROSCOPIC    EKG EKG Interpretation  Date/Time:  Wednesday June 15 2019 08:38:45 EST Ventricular Rate:  69 PR Interval:    QRS Duration: 96 QT Interval:  419 QTC Calculation: 449 R Axis:   -  23 Text Interpretation: Sinus rhythm Borderline left axis deviation no acute changes when compared to prior ecg Confirmed by Madalyn Rob [phone]) on 06/15/2019 9:52:58 AM   Radiology DG Chest 2 View  Result Date: 06/15/2019 CLINICAL DATA:  Shortness of breath EXAM: CHEST - 2 VIEW COMPARISON:  April 29, 2019 and August 23, 2018 FINDINGS: There is persistent bibasilar interstitial prominence, likely representing a degree of underlying fibrosis. The lungs elsewhere are clear. Heart size and pulmonary vascularity are normal. No adenopathy. There is aortic atherosclerosis. There are foci of degenerative change in the thoracic spine. IMPRESSION: Persistent interstitial prominence in the bases, likely due to fibrosis. A degree of underlying atypical organism pneumonia is possible, without change from most recent study. No new opacity evident. Stable cardiac silhouette. Aortic Atherosclerosis (ICD10-I70.0). Electronically Signed   By: Lowella Grip III M.D.   On: 06/15/2019 09:22   DG Lumbar Spine Complete  Result Date: 06/15/2019 CLINICAL DATA:  Low back pain with right-sided radicular symptoms EXAM: LUMBAR SPINE - COMPLETE 4+ VIEW COMPARISON:  July 28, 2016 FINDINGS: Frontal, lateral, spot lumbosacral lateral, and bilateral oblique views were obtained. There are 5 non-rib-bearing lumbar type vertebral bodies. There is no fracture or spondylolisthesis. Disc spaces appear unremarkable. There is no appreciable facet arthropathy. There are apparent laminated gallstones in the right upper quadrant. There is mild aortic atherosclerosis. IMPRESSION: No fracture or spondylolisthesis.  No  evident arthropathy. Cholelithiasis. Aortic Atherosclerosis (ICD10-I70.0). Electronically Signed   By: Lowella Grip III M.D.   On: 06/15/2019 09:24    Procedures Procedures (including critical care time)  Medications Ordered in ED Medications  oxyCODONE-acetaminophen (PERCOCET/ROXICET) 5-325 MG per tablet 1 tablet (1 tablet Oral Given 06/15/19 0920)    ED Course  I have reviewed the triage vital signs and the nursing notes.  Pertinent labs & imaging results that were available during my care of the patient were reviewed by me and considered in my medical decision making (see chart for details).    MDM Rules/Calculators/A&P                      64 year old lady presents to ER with low back pain.  On exam well-appearing, noted some tenderness across her lumbar region.  Labs within normal limits, urine negative.  No abdominal pain, no tenderness to abdomen on exam.  No radicular symptoms, no neurologic symptoms or deficits on exam.  Suspect this is MSK strain.  Provided Rx for muscle relaxer, recommend close recheck with primary doctor, will discharge home.    After the discussed management above, the patient was determined to be safe for discharge.  The patient was in agreement with this plan and all questions regarding their care were answered.  ED return precautions were discussed and the patient will return to the ED with any significant worsening of condition.  Final Clinical Impression(s) / ED Diagnoses Final diagnoses:  Low back pain with bilateral sciatica, unspecified back pain laterality, unspecified chronicity    Rx / DC Orders ED Discharge Orders         Ordered    cyclobenzaprine (FLEXERIL) 10 MG tablet  3 times daily PRN     06/15/19 0957           Lucrezia Starch, MD 06/15/19 [phone]

## 2019-06-21 ENCOUNTER — Emergency Department (HOSPITAL_COMMUNITY): Payer: Medicaid Other

## 2019-06-21 ENCOUNTER — Other Ambulatory Visit: Payer: Self-pay

## 2019-06-21 ENCOUNTER — Encounter (HOSPITAL_COMMUNITY): Payer: Self-pay | Admitting: Emergency Medicine

## 2019-06-21 ENCOUNTER — Emergency Department (HOSPITAL_COMMUNITY)
Admission: EM | Admit: 2019-06-21 | Discharge: 2019-06-21 | Disposition: A | Discharge status: Home / Self Care | Payer: Medicaid Other | Attending: Emergency Medicine | Admitting: Emergency Medicine

## 2019-06-21 DIAGNOSIS — R0789 Other chest pain: Secondary | ICD-10-CM | POA: Diagnosis present

## 2019-06-21 DIAGNOSIS — M79605 Pain in left leg: Secondary | ICD-10-CM

## 2019-06-21 DIAGNOSIS — F17210 Nicotine dependence, cigarettes, uncomplicated: Secondary | ICD-10-CM | POA: Diagnosis not present

## 2019-06-21 DIAGNOSIS — Z79899 Other long term (current) drug therapy: Secondary | ICD-10-CM | POA: Diagnosis not present

## 2019-06-21 LAB — BASIC METABOLIC PANEL
Anion gap: 9 (ref 5–15)
BUN: 10 mg/dL (ref 8–23)
CO2: 24 mmol/L (ref 22–32)
Calcium: 9.8 mg/dL (ref 8.9–10.3)
Chloride: 102 mmol/L (ref 98–111)
Creatinine, Ser: 0.82 mg/dL (ref 0.44–1.00)
GFR calc Af Amer: >60 mL/min (ref >60)
GFR calc non Af Amer: >60 mL/min (ref >60)
Glucose, Bld: 106 mg/dL — ABNORMAL HIGH (ref 70–99)
Potassium: 3.8 mmol/L (ref 3.5–5.1)
Sodium: 135 mmol/L (ref 135–145)

## 2019-06-21 LAB — CBC
HCT: 40.9 % (ref 36.0–46.0)
Hemoglobin: 12.4 g/dL (ref 12.0–15.0)
MCH: 23.3 pg — ABNORMAL LOW (ref 26.0–34.0)
MCHC: 30.3 g/dL (ref 30.0–36.0)
MCV: 76.7 fL — ABNORMAL LOW (ref 80.0–100.0)
Platelets: 342 10*3/uL (ref 150–400)
RBC: 5.33 MIL/uL — ABNORMAL HIGH (ref 3.87–5.11)
RDW: 17.3 % — ABNORMAL HIGH (ref 11.5–15.5)
WBC: 7.6 10*3/uL (ref 4.0–10.5)
nRBC: 0 % (ref 0.0–0.2)

## 2019-06-21 LAB — TROPONIN I (HIGH SENSITIVITY)
Troponin I (High Sensitivity): 3 ng/L (ref <18)
Troponin I (High Sensitivity): 3 ng/L (ref <18)

## 2019-06-21 MED ORDER — SODIUM CHLORIDE 0.9% FLUSH: 3.0000 mL | Freq: Once | INTRAVENOUS | Status: DC

## 2019-06-21 NOTE — ED Notes (Signed)
PT approached this tech, and stated she is going outside to smoke. PT seen ambulating out the front door unassisted.

## 2019-06-21 NOTE — ED Notes (Signed)
Pt to ED room 28 from [place]. Pr is A&Ox4, in NAD. Breathing easy, non-labored. Placed on continuous monitors. VSS. Pt c/o Bilateral generalized leg and arm pain, L-sided CP, and back pain. Pt denies SOB. Denies N/V/D/fevers. Call light placed in reach. Bed in locked, in lowest position, side rails upx2. Warm blanket provided. Will continue to monitor

## 2019-06-21 NOTE — ED Triage Notes (Signed)
Pt BIB [facility] from home, c/o left leg tingling that started when she went to lay down. Also reported chest pain that started en route to ED. Denies shortness of breath.

## 2019-06-21 NOTE — ED Notes (Signed)
EDP at bedside  

## 2019-06-21 NOTE — ED Notes (Signed)
Pt ambulatory to and from BR with strong, steady gait 

## 2019-06-21 NOTE — ED Notes (Signed)
Patient verbalizes understanding of discharge instructions. Opportunity for questioning and answers were provided. All questions answered completely. Armband removed by staff, pt discharged from ED. Wheeled from ED with all belongings. Pt called cab on cell phone to come get her
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
## 2019-06-21 NOTE — Discharge Instructions (Addendum)
Take ibuprofen 600 mg every 6 hours as needed for pain.  Rest.  Follow-up with your primary doctor if symptoms or not improving in the next week.

## 2019-07-18 ENCOUNTER — Emergency Department (HOSPITAL_COMMUNITY): Payer: Medicaid Other

## 2019-07-18 ENCOUNTER — Emergency Department (HOSPITAL_COMMUNITY)
Admission: EM | Admit: 2019-07-18 | Discharge: 2019-07-18 | Disposition: A | Discharge status: Home / Self Care | Payer: Medicaid Other | Attending: Emergency Medicine | Admitting: Emergency Medicine

## 2019-07-18 ENCOUNTER — Ambulatory Visit (HOSPITAL_COMMUNITY)
Admission: EM | Admit: 2019-07-18 | Discharge: 2019-07-18 | Disposition: A | Discharge status: Other Acute Inpatient Hospital | Payer: Medicaid Other | Attending: Family Medicine | Admitting: Family Medicine

## 2019-07-18 ENCOUNTER — Other Ambulatory Visit: Payer: Self-pay

## 2019-07-18 ENCOUNTER — Encounter (HOSPITAL_COMMUNITY): Payer: Self-pay | Admitting: Emergency Medicine

## 2019-07-18 DIAGNOSIS — F17210 Nicotine dependence, cigarettes, uncomplicated: Secondary | ICD-10-CM | POA: Diagnosis not present

## 2019-07-18 DIAGNOSIS — R1030 Lower abdominal pain, unspecified: Secondary | ICD-10-CM | POA: Insufficient documentation

## 2019-07-18 DIAGNOSIS — R296 Repeated falls: Secondary | ICD-10-CM | POA: Diagnosis not present

## 2019-07-18 DIAGNOSIS — R29898 Other symptoms and signs involving the musculoskeletal system: Secondary | ICD-10-CM

## 2019-07-18 DIAGNOSIS — Z79899 Other long term (current) drug therapy: Secondary | ICD-10-CM | POA: Insufficient documentation

## 2019-07-18 DIAGNOSIS — H539 Unspecified visual disturbance: Secondary | ICD-10-CM

## 2019-07-18 DIAGNOSIS — J45909 Unspecified asthma, uncomplicated: Secondary | ICD-10-CM | POA: Diagnosis not present

## 2019-07-18 DIAGNOSIS — W010XXA Fall on same level from slipping, tripping and stumbling without subsequent striking against object, initial encounter: Secondary | ICD-10-CM | POA: Diagnosis not present

## 2019-07-18 DIAGNOSIS — W19XXXA Unspecified fall, initial encounter: Secondary | ICD-10-CM

## 2019-07-18 DIAGNOSIS — R1084 Generalized abdominal pain: Secondary | ICD-10-CM

## 2019-07-18 DIAGNOSIS — R42 Dizziness and giddiness: Secondary | ICD-10-CM

## 2019-07-18 LAB — BASIC METABOLIC PANEL
Anion gap: 9 (ref 5–15)
BUN: 7 mg/dL — ABNORMAL LOW (ref 8–23)
CO2: 20 mmol/L — ABNORMAL LOW (ref 22–32)
Calcium: 9.4 mg/dL (ref 8.9–10.3)
Chloride: 109 mmol/L (ref 98–111)
Creatinine, Ser: 0.73 mg/dL (ref 0.44–1.00)
GFR calc Af Amer: >60 mL/min (ref >60)
GFR calc non Af Amer: >60 mL/min (ref >60)
Glucose, Bld: 114 mg/dL — ABNORMAL HIGH (ref 70–99)
Potassium: 4.7 mmol/L (ref 3.5–5.1)
Sodium: 138 mmol/L (ref 135–145)

## 2019-07-18 LAB — URINALYSIS, ROUTINE W REFLEX MICROSCOPIC
Bilirubin Urine: NEGATIVE
Glucose, UA: NEGATIVE mg/dL
Hgb urine dipstick: NEGATIVE
Ketones, ur: NEGATIVE mg/dL
Leukocytes,Ua: NEGATIVE
Nitrite: NEGATIVE
Protein, ur: NEGATIVE mg/dL
Specific Gravity, Urine: 1.003 — ABNORMAL LOW (ref 1.005–1.030)
pH: 7 (ref 5.0–8.0)

## 2019-07-18 LAB — CBC
HCT: 42.8 % (ref 36.0–46.0)
Hemoglobin: 12.6 g/dL (ref 12.0–15.0)
MCH: 22.7 pg — ABNORMAL LOW (ref 26.0–34.0)
MCHC: 29.4 g/dL — ABNORMAL LOW (ref 30.0–36.0)
MCV: 77 fL — ABNORMAL LOW (ref 80.0–100.0)
Platelets: 339 10*3/uL (ref 150–400)
RBC: 5.56 MIL/uL — ABNORMAL HIGH (ref 3.87–5.11)
RDW: 17.6 % — ABNORMAL HIGH (ref 11.5–15.5)
WBC: 8.5 10*3/uL (ref 4.0–10.5)
nRBC: 0 % (ref 0.0–0.2)

## 2019-07-18 LAB — TROPONIN I (HIGH SENSITIVITY)
Troponin I (High Sensitivity): 5 ng/L (ref <18)
Troponin I (High Sensitivity): 7 ng/L (ref <18)

## 2019-07-18 MED ORDER — SODIUM CHLORIDE 0.9% FLUSH
3.0000 mL | Freq: Once | INTRAVENOUS | Status: AC
  Administered 2019-07-18: 18:00:00 3 mL via INTRAVENOUS

## 2019-07-18 MED ORDER — IOHEXOL 300 MG/ML  SOLN
100.0000 mL | Freq: Once | INTRAMUSCULAR | Status: AC | PRN
  Administered 2019-07-18: 19:00:00 100 mL via INTRAVENOUS

## 2019-07-18 MED ORDER — SODIUM CHLORIDE 0.9 % IV BOLUS
1000.0000 mL | Freq: Once | INTRAVENOUS | Status: AC
  Administered 2019-07-18: 18:00:00 1000 mL via INTRAVENOUS

## 2019-07-18 NOTE — ED Notes (Signed)
Pt verbalized understanding of discharge instructions. Follow up care reviewed, pt ambulated independently to lobby.

## 2019-07-18 NOTE — ED Triage Notes (Signed)
Pt sts multiple falls at home; pt unable to say why she is falling; pt sts some dizziness and feeling "hot"; pt sts has not felt good in 2 weeks

## 2019-07-18 NOTE — ED Provider Notes (Signed)
[place]    CSN: [id number] Arrival date & time: 07/18/19  1221      History   Chief Complaint Chief Complaint  Patient presents with  . Fall  . Dizziness    HPI Karen Best is a 64 y.o. female.   HPI   With chronic mental illness schizophrenia bipolar disease.  Poor historian.  Unable to tell me what medications she is taking.  States her medications were changed recently.  Cannot tell me which medications were altered. Says for the last 2 weeks she has been having frequent falls.  She states that her vision is blurry off and on.  She usually gets vision impairment and just her right eye.  States one of her legs is weak.  Cannot tell me which leg is weak. States that her appetite is poor.  She is drinking normally but only drinks [employer]eps[employer].  Denies alcohol.  Denies drugs.  States she is compliant with her medications.  Says her medicines are delivered to her in a blister pack and she takes them as prescribed. Denies recent illness.  Denies cough cold or runny nose.  Denies fever chills.  Denies vomiting or diarrhea.  Denies shortness of breath. Patient is a smoker. Patient states she has had a stroke in the past.  Unclear if this left her with any focal weakness or disability. Patient lives alone. Patient was dropped off by a neighbor. When asked, anytime she is followed she is unable to answer.  When asked if she was injured with any of her bowels she states she does not think so.  When asked if she has had any blacking out or unconscious spells she states no.  She states she just gets weak all over and slumped to the ground. Patient states she is unable to get off the floor once she falls.  She relies on her neighbors to come get her.  She has been on the floor for as long as an hour.  Past Medical History:  Diagnosis Date  . Asthma   . Bipolar 1 disorder ([place])   . Fibromyalgia   . Schizophrenia ([place])   . Tobacco abuse     Patient Active Problem List   Diagnosis Date Noted  . Lithium toxicity 02/16/2019  . Bipolar 1 disorder ([place]) 02/16/2019  . Hyperkalemia 02/16/2019  . Acute confusion 12/29/2018  . Toxic metabolic encephalopathy [id number]  . Dysarthria 12/23/2018  . Elevated blood pressure reading 12/23/2018  . Asthma 12/23/2018  . Delirium due to another medical condition   . UTI (urinary tract infection) 12/06/2018  . Acute metabolic encephalopathy 123XX123  . 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 Psychosis ([place]) 09/16/2013  . Schizophrenia [facility])     Past Surgical History:  Procedure Laterality Date  . CESAREAN SECTION    . TUBAL LIGATION      OB History   No obstetric history on file.      Home Medications    Prior to Admission medications   Medication Sig Start Date End Date Taking? Authorizing Provider  acetaminophen (TYLENOL) 325 MG tablet Take 650 mg by mouth every 6 (six) hours as needed for mild pain or moderate pain.    [provider]  cetirizine (ZYRTEC) 10 MG tablet Take 10 mg by mouth daily.    [provider]  cyclobenzaprine (FLEXERIL) 10 MG tablet Take 1 tablet (10 mg total) by mouth 3 (three) times daily as needed for muscle spasms. 06/15/19  Lucrezia Starch, MD  famotidine (PEPCID) 40 MG tablet Take 40 mg by mouth 2 (two) times daily.    [provider]  hydrOXYzine (ATARAX/VISTARIL) 50 MG tablet Take 50 mg by mouth at bedtime.    [provider]  ibuprofen (ADVIL) 200 MG tablet Take 400 mg by mouth every 6 (six) hours as needed for headache or mild pain.    [provider]  lidocaine (LIDODERM) 5 % Place 1 patch onto the skin daily. Remove & Discard patch within 12 hours or as directed by MD 06/10/19   Jacqlyn Larsen, PA-C  lithium carbonate (LITHOBID) 300 MG CR tablet Take 1 tablet (300 mg total) by mouth 2 (two) times daily. 02/17/19 03/19/19  Elodia Florence., MD  OLANZapine (ZYPREXA) 10 MG tablet Take 10 mg by mouth daily.      [provider]  OLANZapine (ZYPREXA) 20 MG tablet Take 1 tablet (20 mg total) by mouth at bedtime. 12/31/18 03/01/19  Cherylann Ratel A, DO  Valbenazine Tosylate (INGREZZA) 80 MG CAPS Take 1 tablet by mouth at bedtime.     [provider]    Family History Family History  Problem Relation Age of Onset  . Liver cancer Neg Hx   . Liver disease Neg Hx     Social History Social History   Tobacco Use  . Smoking status: Current Every Day Smoker    Packs/day: 0.50    Types: Cigarettes  . Smokeless tobacco: Never Used  Substance Use Topics  . Alcohol use: Yes    Comment: occ  . Drug use: Yes    Types: Cocaine    Comment: crack cocaine (last use 10/25/2018)     Allergies   Penicillins   Review of Systems Review of Systems  Eyes: Positive for visual disturbance.  Musculoskeletal: Positive for gait problem.  Neurological: Positive for dizziness, weakness and light-headedness. Negative for headaches.     Physical Exam Triage Vital Signs ED Triage Vitals [07/18/19 1314]  Enc Vitals Group     BP (!) 161/74     Pulse Rate 78     Resp 16     Temp 98.1 F (36.7 C)     Temp Source Oral     SpO2 99 %     Weight      Height      Head Circumference      Peak Flow      Pain Score      Pain Loc      Pain Edu?      Excl. in [place]?    No data found.  Updated Vital Signs BP (!) 161/74 (BP Location: Left Arm)   Pulse 78   Temp 98.1 F (36.7 C) (Oral)   Resp 16   SpO2 99%      Physical Exam Constitutional:      General: She is not in acute distress.    Appearance: She is well-developed.     Comments: Slow responses.  Often responds "I do not know"  HENT:     Head: Normocephalic and atraumatic.     Nose: Nose normal.     Mouth/Throat:     Mouth: Mucous membranes are moist.     Pharynx: No posterior oropharyngeal erythema.  Eyes:     Conjunctiva/sclera: Conjunctivae normal.     Pupils: Pupils are equal, round, and reactive to light.  Cardiovascular:      Rate and Rhythm: Normal rate and regular rhythm.  Comments: Regular rate no ectopy no murmur Pulmonary:     Effort: Pulmonary effort is normal. No respiratory distress.     Breath sounds: Normal breath sounds.  Abdominal:     General: There is no distension.     Palpations: Abdomen is soft.     Tenderness: There is no abdominal tenderness.  Musculoskeletal:        General: No swelling or deformity. Normal range of motion.     Cervical back: Normal range of motion.  Lymphadenopathy:     Cervical: No cervical adenopathy.  Skin:    General: Skin is warm and dry.  Neurological:     Cranial Nerves: No cranial nerve deficit.     Motor: No weakness.     Coordination: Coordination abnormal.     Gait: Gait abnormal.     Deep Tendon Reflexes: Reflexes abnormal.     Comments: Small steps.  Shuffling gait.  Reflexes normal in right knee absent on the left.  No focal numbness or weakness identified  Psychiatric:     Comments: Slow responses.  Seems somewhat sluggish.  Sometimes difficult to understand.      UC Treatments / Results  Labs (all labs ordered are listed, but only abnormal results are displayed) Labs Reviewed - No data to display  EKG   Radiology No results found.  Procedures Procedures (including critical care time)  Medications Ordered in UC Medications - No data to display  Initial Impression / Assessment and Plan / UC Course  I have reviewed the triage vital signs and the nursing notes.  Pertinent labs & imaging results that were available during my care of the patient were reviewed by me and considered in my medical decision making (see chart for details).     Patient with vague symptoms.  Could be neurologic.  Unable to do complete work-up.  The urgent care center.  Recommend emergency room evaluation for her frequent falls, complaint of unilateral weakness, complaint of unilateral vision changes.  Possible TIA versus stroke.  Possibly related to her  mental illness or medications. Final Clinical Impressions(s) / UC Diagnoses   Final diagnoses:  Falls frequently  Visual disturbance  Complaints of leg weakness  Dizziness     Discharge Instructions     You need to go to the ER for evaluation    ED Prescriptions    None     PDMP not reviewed this encounter.   Raylene Everts, MD 07/18/19 1352

## 2019-07-18 NOTE — ED Triage Notes (Signed)
Pt here from UC with c/o weakness and multiple falls , pt is a very poor historian

## 2019-07-18 NOTE — Discharge Instructions (Signed)
You need to go to the ER for evaluation

## 2019-07-18 NOTE — ED Notes (Signed)
Pt ambulated to and from bathroom with steady gait, no dizziness or lightheadedness

## 2019-07-18 NOTE — ED Provider Notes (Signed)
[place] EMERGENCY DEPARTMENT Provider Note   CSN: [id number] Arrival date & time: 07/18/19  1358     History No chief complaint on file.   Karen Best is a 64 y.o. female.  HPI She presents for evaluation of difficulty walking, lower abdominal pain, right ear congestion, and constipation.  She denies dysuria.  She has not had any fever, chills, shortness of breath, or change in urinary habits.  She went to see a doctor at an urgent care today who directed her here for evaluation.  She told the doctor that she was having intermittent trouble with her vision but denies vision disorder when I asked her about it.  There are no other known modifying factors.  Past Medical History:  Diagnosis Date  . Asthma   . Bipolar 1 disorder ([place])   . Fibromyalgia   . Schizophrenia ([place])   . Tobacco abuse     Patient Active Problem List   Diagnosis Date Noted  . Lithium toxicity 02/16/2019  . Bipolar 1 disorder ([place]) 02/16/2019  . Hyperkalemia 02/16/2019  . Acute confusion 12/29/2018  . Toxic metabolic encephalopathy Q000111Q  . Dysarthria 12/23/2018  . Elevated blood pressure reading 12/23/2018  . Asthma 12/23/2018  . Delirium due to another medical condition   . UTI (urinary tract infection) 12/06/2018  . Acute metabolic encephalopathy 123XX123  . 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 Psychosis ([place]) 09/16/2013  . Schizophrenia [facility])     Past Surgical History:  Procedure Laterality Date  . CESAREAN SECTION    . TUBAL LIGATION       OB History   No obstetric history on file.     Family History  Problem Relation Age of Onset  . Liver cancer Neg Hx   . Liver disease Neg Hx     Social History   Tobacco Use  . Smoking status: Current Every Day Smoker    Packs/day: 0.50    Types: Cigarettes  . Smokeless tobacco: Never Used  Substance Use Topics  . Alcohol use: Yes    Comment: occ  . Drug use: Yes    Types: Cocaine   Comment: crack cocaine (last use 10/25/2018)    Home Medications Prior to Admission medications   Medication Sig Start Date End Date Taking? Authorizing Provider  acetaminophen (TYLENOL) 325 MG tablet Take 650 mg by mouth every 6 (six) hours as needed for mild pain or moderate pain.    [provider]  cetirizine (ZYRTEC) 10 MG tablet Take 10 mg by mouth daily.    [provider]  cyclobenzaprine (FLEXERIL) 10 MG tablet Take 1 tablet (10 mg total) by mouth 3 (three) times daily as needed for muscle spasms. 06/15/19   Lucrezia Starch, MD  famotidine (PEPCID) 40 MG tablet Take 40 mg by mouth 2 (two) times daily.    [provider]  hydrOXYzine (ATARAX/VISTARIL) 50 MG tablet Take 50 mg by mouth at bedtime.    [provider]  ibuprofen (ADVIL) 200 MG tablet Take 400 mg by mouth every 6 (six) hours as needed for headache or mild pain.    [provider]  lidocaine (LIDODERM) 5 % Place 1 patch onto the skin daily. Remove & Discard patch within 12 hours or as directed by MD 06/10/19   Jacqlyn Larsen, PA-C  lithium carbonate (LITHOBID) 300 MG CR tablet Take 1 tablet (300 mg total) by mouth 2 (two) times daily. 02/17/19 03/19/19  Alben Deeds  Brooke Bonito., MD  OLANZapine (ZYPREXA) 10 MG tablet Take 10 mg by mouth daily.     [provider]  OLANZapine (ZYPREXA) 20 MG tablet Take 1 tablet (20 mg total) by mouth at bedtime. 12/31/18 03/01/19  Cherylann Ratel A, DO  Valbenazine Tosylate (INGREZZA) 80 MG CAPS Take 1 tablet by mouth at bedtime.     [provider]    Allergies    Penicillins  Review of Systems   Review of Systems  All other systems reviewed and are negative.   Physical Exam Updated Vital Signs BP (!) 154/76   Pulse 65   Temp 99 F (37.2 C) (Oral)   Resp 16   SpO2 98%   Physical Exam Vitals and nursing note reviewed.  Constitutional:      General: She is not in acute distress.    Appearance: She is well-developed. She is  not ill-appearing, toxic-appearing or diaphoretic.  HENT:     Head: Normocephalic and atraumatic.     Right Ear: External ear normal.     Left Ear: External ear normal.  Eyes:     Conjunctiva/sclera: Conjunctivae normal.     Pupils: Pupils are equal, round, and reactive to light.  Neck:     Trachea: Phonation normal.  Cardiovascular:     Rate and Rhythm: Normal rate and regular rhythm.     Heart sounds: Normal heart sounds.  Pulmonary:     Effort: Pulmonary effort is normal. No respiratory distress.     Breath sounds: Normal breath sounds. No stridor.  Abdominal:     Palpations: Abdomen is soft.     Tenderness: There is abdominal tenderness (Suprapubic, mild).  Musculoskeletal:        General: Normal range of motion.     Cervical back: Normal range of motion and neck supple.  Skin:    General: Skin is warm and dry.     Coloration: Skin is not jaundiced.  Neurological:     Mental Status: She is alert and oriented to person, place, and time.     Cranial Nerves: No cranial nerve deficit.     Sensory: No sensory deficit.     Motor: No abnormal muscle tone.     Coordination: Coordination normal.     Comments: No dysarthria or aphasia  Psychiatric:        Mood and Affect: Mood normal.        Behavior: Behavior normal.        Thought Content: Thought content normal.        Judgment: Judgment normal.     ED Results / Procedures / Treatments   Labs (all labs ordered are listed, but only abnormal results are displayed) Labs Reviewed  BASIC METABOLIC PANEL - Abnormal; Notable for the following components:      Result Value   CO2 20 (*)    Glucose, Bld 114 (*)    BUN 7 (*)    All other components within normal limits  CBC - Abnormal; Notable for the following components:   RBC 5.56 (*)    MCV 77.0 (*)    MCH 22.7 (*)    MCHC 29.4 (*)    RDW 17.6 (*)    All other components within normal limits  URINALYSIS, ROUTINE W REFLEX MICROSCOPIC - Abnormal; Notable for the following  components:   Color, Urine STRAW (*)    Specific Gravity, Urine 1.003 (*)    All other components within normal limits  TROPONIN I (HIGH SENSITIVITY)  TROPONIN I (HIGH SENSITIVITY)    EKG EKG Interpretation  Date/Time:  Monday July 18 2019 14:18:07 EDT Ventricular Rate:  83 PR Interval:  138 QRS Duration: 88 QT Interval:  384 QTC Calculation: 451 R Axis:   -63 Text Interpretation: Normal sinus rhythm Left axis deviation Abnormal ECG since last tracing no significant change Confirmed by Daleen Bo [phone]) on 07/18/2019 5:31:51 PM   Radiology DG Chest 2 View  Result Date: 07/18/2019 CLINICAL DATA:  64 year old female with weakness. EXAM: CHEST - 2 VIEW COMPARISON:  Chest radiograph dated 06/21/2019. FINDINGS: There is diffuse interstitial coarsening and reticulonodular densities similar to prior radiograph, likely chronic. Mild chronic bronchitic changes noted. No new consolidation. There is no pleural effusion or pneumothorax the cardiac silhouette is within normal limits. No acute osseous pathology. Tiny radiopaque fragments noted over the left shoulder similar to prior radiograph. IMPRESSION: 1. No acute cardiopulmonary process. 2. Chronic interstitial changes. Electronically Signed   By: Anner Crete M.D.   On: 07/18/2019 15:36   CT Abdomen Pelvis W Contrast  Result Date: 07/18/2019 CLINICAL DATA:  Suspected abdominal abscess/infection. EXAM: CT ABDOMEN AND PELVIS WITH CONTRAST TECHNIQUE: Multidetector CT imaging of the abdomen and pelvis was performed using the standard protocol following bolus administration of intravenous contrast. CONTRAST:  160mL OMNIPAQUE IOHEXOL 300 MG/ML  SOLN COMPARISON:  October 18, 2017 FINDINGS: Lower chest: No acute abnormality. Hepatobiliary: No focal liver abnormality is seen. Numerous tiny gallstones are seen within the lumen of an otherwise normal-appearing gallbladder. Pancreas: Unremarkable. No pancreatic ductal dilatation or surrounding  inflammatory changes. Spleen: Normal in size without focal abnormality. Adrenals/Urinary Tract: Adrenal glands are unremarkable. Kidneys are normal, without renal calculi or hydronephrosis. A subcentimeter cyst is seen within the medial aspect of the mid left kidney. Bladder is unremarkable. Stomach/Bowel: Stomach is within normal limits. The appendix is not identified. No evidence of bowel wall thickening, distention, or inflammatory changes. Noninflamed diverticula are seen throughout the descending colon. Vascular/Lymphatic: There is mild aortic calcification. No enlarged abdominal or pelvic lymph nodes. Reproductive: Uterus and bilateral adnexa are unremarkable. Other: No abdominal wall hernia or abnormality. No abdominopelvic ascites. Musculoskeletal: No acute or significant osseous findings. IMPRESSION: 1. Cholelithiasis without evidence of cholecystitis. 2. Noninflamed descending colon diverticulosis. Aortic Atherosclerosis (ICD10-I70.0). Electronically Signed   By: Virgina Norfolk M.D.   On: 07/18/2019 19:12    Procedures Procedures (including critical care time)  Medications Ordered in ED Medications  sodium chloride flush (NS) 0.9 % injection 3 mL (3 mLs Intravenous Given 07/18/19 1823)  sodium chloride 0.9 % bolus 1,000 mL (1,000 mLs Intravenous New Bag/Given 07/18/19 1823)  iohexol (OMNIPAQUE) 300 MG/ML solution 100 mL (100 mLs Intravenous Contrast Given 07/18/19 1900)    ED Course  I have reviewed the triage vital signs and the nursing notes.  Pertinent labs & imaging results that were available during my care of the patient were reviewed by me and considered in my medical decision making (see chart for details).  Clinical Course as of Jul 17 2033  Mon Jul 18, 2019  1730 Normal  Troponin I (High Sensitivity) [EW]  1730 Normal except CO2 low, glucose high, BUN low  Basic metabolic panel(!) [EW]  [id number] Normal except MCV low, RBC indices low  CBC(!) [EW]  1731 No CHF or infiltrate,  interpreted by me  DG Chest 2 View [EW]  1946 Normal  Troponin I (High Sensitivity) [EW]  1947 Per radiologist, no acute abnormalities.  Gallstones present without cholecystitis, diverticulosis present without diverticulitis.  No visible bony injury.   [EW]  1948 Patient currently hungry and able to eat.   [EW]    Clinical Course User Index [EW] Daleen Bo, MD   MDM Rules/Calculators/A&P                        Patient Vitals for the past 24 hrs:  BP Temp Temp src Pulse Resp SpO2  07/18/19 1845 (!) 154/76 - - 65 16 98 %  07/18/19 1416 (!) 165/86 99 F (37.2 C) Oral 85 16 97 %    8:34 PM Reevaluation with update and discussion. After initial assessment and treatment, an updated evaluation reveals she is more comfortable, eating without problems at this time.  Findings discussed with the patient and all questions were answered. Daleen Bo   Medical Decision Making: Nonspecific abdominal pain, with history of fall without evident injury.  Screening evaluation is reassuring.  Patient stable for discharge with outpatient management.  She has a PCP for follow-up.  Karen Best was evaluated in Emergency Department on 07/18/2019 for the symptoms described in the history of present illness. She was evaluated in the context of the global COVID-19 pandemic, which necessitated consideration that the patient might be at risk for infection with the SARS-CoV-2 virus that causes COVID-19. Institutional protocols and algorithms that pertain to the evaluation of patients at risk for COVID-19 are in a state of rapid change based on information released by regulatory bodies including the CDC and federal and state organizations. These policies and algorithms were followed during the patient's care in the ED.   CRITICAL CARE-no Performed by: Daleen Bo   Nursing Notes Reviewed/ Care Coordinated Applicable Imaging Reviewed Interpretation of Laboratory Data incorporated into ED treatment   The patient appears reasonably screened and/or stabilized for discharge and I doubt any other medical condition or other [facility] requiring further screening, evaluation, or treatment in the ED at this time prior to discharge.  Plan: Home Medications-continue current; Home Treatments-regular diet and plenty of fluids; return here if the recommended treatment, does not improve the symptoms; Recommended follow up-PCP follow-up for further evaluation possibly PT referral.    Final Clinical Impression(s) / ED Diagnoses Final diagnoses:  Fall, initial encounter  Generalized abdominal pain    Rx / DC Orders ED Discharge Orders    None       Daleen Bo, MD 07/18/19 2036

## 2019-07-18 NOTE — Discharge Instructions (Addendum)
Make sure that you eat 3 good meals every day and drink a lot of fluids, especially water.  If you feel weak, like he will fall, sit down immediately.  Call your doctor for a follow-up appointment.  They can see you and initiate further evaluations and possibly start physical therapy to help you get stronger.

## 2019-08-12 ENCOUNTER — Emergency Department (HOSPITAL_COMMUNITY): Payer: Medicaid Other

## 2019-08-12 ENCOUNTER — Emergency Department (HOSPITAL_COMMUNITY)
Admission: EM | Admit: 2019-08-12 | Discharge: 2019-08-12 | Disposition: A | Discharge status: Home / Self Care | Payer: Medicaid Other | Attending: Emergency Medicine | Admitting: Emergency Medicine

## 2019-08-12 DIAGNOSIS — M545 Low back pain, unspecified: Secondary | ICD-10-CM

## 2019-08-12 DIAGNOSIS — F17210 Nicotine dependence, cigarettes, uncomplicated: Secondary | ICD-10-CM | POA: Insufficient documentation

## 2019-08-12 DIAGNOSIS — G8929 Other chronic pain: Secondary | ICD-10-CM

## 2019-08-12 DIAGNOSIS — Z79899 Other long term (current) drug therapy: Secondary | ICD-10-CM | POA: Insufficient documentation

## 2019-08-12 DIAGNOSIS — M5489 Other dorsalgia: Secondary | ICD-10-CM | POA: Diagnosis present

## 2019-08-12 DIAGNOSIS — J45909 Unspecified asthma, uncomplicated: Secondary | ICD-10-CM | POA: Insufficient documentation

## 2019-08-12 LAB — URINALYSIS, ROUTINE W REFLEX MICROSCOPIC
Bilirubin Urine: NEGATIVE
Glucose, UA: NEGATIVE mg/dL
Hgb urine dipstick: NEGATIVE
Ketones, ur: NEGATIVE mg/dL
Leukocytes,Ua: NEGATIVE
Nitrite: NEGATIVE
Protein, ur: NEGATIVE mg/dL
Specific Gravity, Urine: 1.002 — ABNORMAL LOW (ref 1.005–1.030)
pH: 6 (ref 5.0–8.0)

## 2019-08-12 MED ORDER — HYDROCODONE-ACETAMINOPHEN 5-325 MG PO TABS
1.0000 | ORAL_TABLET | Freq: Once | ORAL | Status: AC
  Administered 2019-08-12: 1 via ORAL
  Filled 2019-08-12: qty 1

## 2019-08-12 MED ORDER — CYCLOBENZAPRINE HCL 10 MG PO TABS
5.0000 mg | ORAL_TABLET | Freq: Once | ORAL | Status: AC
  Administered 2019-08-12: 5 mg via ORAL
  Filled 2019-08-12: qty 1

## 2019-08-12 MED ORDER — CYCLOBENZAPRINE HCL 10 MG PO TABS: 10.0000 mg | ORAL_TABLET | Freq: Three times a day (TID) | ORAL | 0 refills | Status: DC | PRN

## 2019-08-12 NOTE — ED Notes (Signed)
Pt mobile within room and to BR without need for (A) or acute pain responses.  Does require instructions on how to sit on her chair repeatedly.

## 2019-08-12 NOTE — Discharge Instructions (Signed)
You were seen today for back pain.  Your x-ray was reassuring.  We have treated you with some pain medication and muscle relaxants.  You should follow-up with your primary care doctor so that they can further treat your pain.

## 2019-08-12 NOTE — ED Provider Notes (Signed)
[facility] EMERGENCY DEPARTMENT Provider Note   CSN: [id number] Arrival date & time: 08/12/19  1054     History No chief complaint on file.   Karen Best is a 64 y.o. female.  Patient is a 64 year old female with past medical history of schizophrenia, bipolar disorder, fibromyalgia and asthma presenting to the emergency department for back pain.  She is somewhat of a poor historian.  Per my review she has had multiple emergency departments for similar dating back to January of this year.  She denies any injury or trauma.  Reports pain across the lower part of her back.  Denies any fever, chills, saddle anesthesia, loss of control of bowel or bladder movements.  Reports sometimes she gets numbness in the tops of her bilateral thighs but does not extend past her knees.  She reports she has not taking any medication for this.  Denies any urinary symptoms.        Past Medical History:  Diagnosis Date  . Asthma   . Bipolar 1 disorder ([place])   . Fibromyalgia   . Schizophrenia ([place])   . Tobacco abuse     Patient Active Problem List   Diagnosis Date Noted  . Lithium toxicity 02/16/2019  . Bipolar 1 disorder ([place]) 02/16/2019  . Hyperkalemia 02/16/2019  . Acute confusion 12/29/2018  . Toxic metabolic encephalopathy [id number]  . Dysarthria 12/23/2018  . Elevated blood pressure reading 12/23/2018  . Asthma 12/23/2018  . Delirium due to another medical condition   . UTI (urinary tract infection) 12/06/2018  . Acute metabolic encephalopathy [id number]  . 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 Psychosis ([place]) 09/16/2013  . Schizophrenia [facility])     Past Surgical History:  Procedure Laterality Date  . CESAREAN SECTION    . TUBAL LIGATION       OB History   No obstetric history on file.     Family History  Problem Relation Age of Onset  . Liver cancer Neg Hx   . Liver disease Neg Hx     Social History   Tobacco Use  . Smoking  status: Current Every Day Smoker    Packs/day: 0.50    Types: Cigarettes  . Smokeless tobacco: Never Used  Substance Use Topics  . Alcohol use: Yes    Comment: occ  . Drug use: Yes    Types: Cocaine    Comment: crack cocaine (last use 10/25/2018)    Home Medications Prior to Admission medications   Medication Sig Start Date End Date Taking? Authorizing Provider  acetaminophen (TYLENOL) 325 MG tablet Take 650 mg by mouth every 6 (six) hours as needed for mild pain or moderate pain.    [provider]  cetirizine (ZYRTEC) 10 MG tablet Take 10 mg by mouth daily.    [provider]  cyclobenzaprine (FLEXERIL) 10 MG tablet Take 1 tablet (10 mg total) by mouth 3 (three) times daily as needed for muscle spasms. 08/12/19   Alveria Apley, PA-C  famotidine (PEPCID) 40 MG tablet Take 40 mg by mouth 2 (two) times daily.    [provider]  hydrOXYzine (ATARAX/VISTARIL) 50 MG tablet Take 50 mg by mouth at bedtime.    [provider]  ibuprofen (ADVIL) 200 MG tablet Take 400 mg by mouth every 6 (six) hours as needed for headache or mild pain.    [provider]  lidocaine (LIDODERM) 5 % Place 1 patch onto the skin daily. Remove & Discard  patch within 12 hours or as directed by MD 06/10/19   Jacqlyn Larsen, PA-C  lithium carbonate (LITHOBID) 300 MG CR tablet Take 1 tablet (300 mg total) by mouth 2 (two) times daily. 02/17/19 03/19/19  Elodia Florence., MD  OLANZapine (ZYPREXA) 10 MG tablet Take 10 mg by mouth daily.     [provider]  OLANZapine (ZYPREXA) 20 MG tablet Take 1 tablet (20 mg total) by mouth at bedtime. 12/31/18 03/01/19  Cherylann Ratel A, DO  Valbenazine Tosylate (INGREZZA) 80 MG CAPS Take 1 tablet by mouth at bedtime.     [provider]    Allergies    Penicillins  Review of Systems   Review of Systems  Constitutional: Negative for chills and fever.  HENT: Negative for ear pain and sore throat.   Eyes: Negative for  pain and visual disturbance.  Respiratory: Negative for cough and shortness of breath.   Cardiovascular: Negative for chest pain and palpitations.  Gastrointestinal: Negative for abdominal pain and vomiting.  Genitourinary: Negative for dysuria and hematuria.  Musculoskeletal: Positive for arthralgias and back pain. Negative for gait problem and neck pain.  Skin: Negative for color change and rash.  Neurological: Positive for numbness. Negative for seizures and syncope.  All other systems reviewed and are negative.   Physical Exam Updated Vital Signs BP (!) 139/91 (BP Location: Right Arm)   Pulse 89   Temp 98.8 F (37.1 C) (Oral)   Resp 16   Ht 5\' 5"  (1.651 m)   Wt 68 kg   SpO2 97%   BMI 24.96 kg/m   Physical Exam Vitals and nursing note reviewed.  Constitutional:      Appearance: Normal appearance.  HENT:     Head: Normocephalic.  Eyes:     Conjunctiva/sclera: Conjunctivae normal.  Pulmonary:     Effort: Pulmonary effort is normal.  Musculoskeletal:     Cervical back: Normal.     Thoracic back: Normal.     Lumbar back: No swelling, edema, deformity, signs of trauma, spasms, tenderness or bony tenderness. Negative right straight leg raise test and negative left straight leg raise test.  Skin:    General: Skin is dry.  Neurological:     Mental Status: She is alert.     Sensory: No sensory deficit.     Motor: No weakness.     Coordination: Coordination normal.     Gait: Gait normal.     Deep Tendon Reflexes: Reflexes normal.  Psychiatric:        Mood and Affect: Mood normal.     Comments: Flat affect     ED Results / Procedures / Treatments   Labs (all labs ordered are listed, but only abnormal results are displayed) Labs Reviewed  URINALYSIS, ROUTINE W REFLEX MICROSCOPIC - Abnormal; Notable for the following components:      Result Value   Color, Urine STRAW (*)    Specific Gravity, Urine 1.002 (*)    All other components within normal limits     EKG None  Radiology DG Lumbar Spine Complete  Result Date: 08/12/2019 CLINICAL DATA:  Low back pain EXAM: LUMBAR SPINE - COMPLETE 4+ VIEW COMPARISON:  June 15, 2019 FINDINGS: Frontal, lateral, spot lumbosacral lateral, and bilateral oblique views were obtained. There are 5 non-rib-bearing lumbar type vertebral bodies. There is no fracture or spondylolisthesis. Disc spaces appear normal. There is no appreciable facet arthropathy. There is aortic atherosclerosis. There are apparent small gallstones in the right upper  quadrant. IMPRESSION: 1.  No fracture or spondylolisthesis.  No appreciable arthropathy. 2.  Cholelithiasis. 3.  Aortic Atherosclerosis (ICD10-I70.0). Electronically Signed   By: Lowella Grip III M.D.   On: 08/12/2019 13:27    Procedures Procedures (including critical care time)  Medications Ordered in ED Medications  HYDROcodone-acetaminophen (NORCO/VICODIN) 5-325 MG per tablet 1 tablet (1 tablet Oral Given 08/12/19 1230)  cyclobenzaprine (FLEXERIL) tablet 5 mg (5 mg Oral Given 08/12/19 1230)    ED Course  I have reviewed the triage vital signs and the nursing notes.  Pertinent labs & imaging results that were available during my care of the patient were reviewed by me and considered in my medical decision making (see chart for details).  Clinical Course as of Aug 11 1344  Fri Aug 12, 2019  1233 Patient was evaluated for back pain today. Patient has no concerning symptoms or physical exam findings including no fever, no loss of control of bowel or bladder, no urinary retention, no saddle anesthesia, no leg weakness and no pain radiation into the legs. She was given medication to treat her symptoms and advised to f/u with PMD for further workup including possible PT, medication change, further imaging, etc. She was advised on all concerning symptoms above and to return to the ED if any of them arise.       [KM]    Clinical Course User Index [KM] Kristine Royal   MDM Rules/Calculators/A&P                      Based on review of vitals, medical screening exam, lab work and/or imaging, there does not appear to be an acute, emergent etiology for the patient's symptoms. Counseled pt on good return precautions and encouraged both PCP and ED follow-up as needed.  Prior to discharge, I also discussed incidental imaging findings with patient in detail and advised appropriate, recommended follow-up in detail.  Clinical Impression: 1. Chronic bilateral low back pain without sciatica     Disposition: Discharge  Prior to providing a prescription for a controlled substance, I independently reviewed the patient's recent prescription history on the [facility]. The patient had no recent or regular prescriptions and was deemed appropriate for a brief, less than 3 day prescription of narcotic for acute analgesia.  This note was prepared with assistance of Systems analyst. Occasional wrong-word or sound-a-like substitutions may have occurred due to the inherent limitations of voice recognition software.  Final Clinical Impression(s) / ED Diagnoses Final diagnoses:  Chronic bilateral low back pain without sciatica    Rx / DC Orders ED Discharge Orders         Ordered    cyclobenzaprine (FLEXERIL) 10 MG tablet  3 times daily PRN     08/12/19 1345           Kristine Royal 08/12/19 1346    Daleen Bo, MD 08/13/19 1623

## 2019-08-12 NOTE — ED Triage Notes (Signed)
Per [facility] pt c/o lower back pain x 1 month. Denies any trauma. Has been taking aspirin daily for pain w/o relief.

## 2019-09-02 ENCOUNTER — Other Ambulatory Visit: Payer: Self-pay

## 2019-09-02 ENCOUNTER — Emergency Department (HOSPITAL_COMMUNITY)
Admission: EM | Admit: 2019-09-02 | Discharge: 2019-09-02 | Disposition: A | Discharge status: Home / Self Care | Payer: Medicaid Other | Attending: Emergency Medicine | Admitting: Emergency Medicine

## 2019-09-02 ENCOUNTER — Emergency Department (HOSPITAL_COMMUNITY): Payer: Medicaid Other

## 2019-09-02 DIAGNOSIS — Z8709 Personal history of other diseases of the respiratory system: Secondary | ICD-10-CM | POA: Insufficient documentation

## 2019-09-02 DIAGNOSIS — Z79899 Other long term (current) drug therapy: Secondary | ICD-10-CM | POA: Diagnosis not present

## 2019-09-02 DIAGNOSIS — R531 Weakness: Secondary | ICD-10-CM

## 2019-09-02 DIAGNOSIS — F17210 Nicotine dependence, cigarettes, uncomplicated: Secondary | ICD-10-CM | POA: Insufficient documentation

## 2019-09-02 DIAGNOSIS — F250 Schizoaffective disorder, bipolar type: Secondary | ICD-10-CM | POA: Insufficient documentation

## 2019-09-02 DIAGNOSIS — R519 Headache, unspecified: Secondary | ICD-10-CM | POA: Diagnosis present

## 2019-09-02 LAB — CBC WITH DIFFERENTIAL/PLATELET
Abs Immature Granulocytes: 0.03 10*3/uL (ref 0.00–0.07)
Basophils Absolute: 0 10*3/uL (ref 0.0–0.1)
Basophils Relative: 0 %
Eosinophils Absolute: 0.6 10*3/uL — ABNORMAL HIGH (ref 0.0–0.5)
Eosinophils Relative: 9 %
HCT: 42.9 % (ref 36.0–46.0)
Hemoglobin: 12.7 g/dL (ref 12.0–15.0)
Immature Granulocytes: 1 %
Lymphocytes Relative: 26 %
Lymphs Abs: 1.7 10*3/uL (ref 0.7–4.0)
MCH: 22.9 pg — ABNORMAL LOW (ref 26.0–34.0)
MCHC: 29.6 g/dL — ABNORMAL LOW (ref 30.0–36.0)
MCV: 77.3 fL — ABNORMAL LOW (ref 80.0–100.0)
Monocytes Absolute: 0.5 10*3/uL (ref 0.1–1.0)
Monocytes Relative: 7 %
Neutro Abs: 3.8 10*3/uL (ref 1.7–7.7)
Neutrophils Relative %: 57 %
Platelets: 286 10*3/uL (ref 150–400)
RBC: 5.55 MIL/uL — ABNORMAL HIGH (ref 3.87–5.11)
RDW: 14.8 % (ref 11.5–15.5)
WBC: 6.7 10*3/uL (ref 4.0–10.5)
nRBC: 0 % (ref 0.0–0.2)

## 2019-09-02 LAB — COMPREHENSIVE METABOLIC PANEL
ALT: 22 U/L (ref 0–44)
AST: 19 U/L (ref 15–41)
Albumin: 3.6 g/dL (ref 3.5–5.0)
Alkaline Phosphatase: 69 U/L (ref 38–126)
Anion gap: 7 (ref 5–15)
BUN: <5 mg/dL — ABNORMAL LOW (ref 8–23)
CO2: 23 mmol/L (ref 22–32)
Calcium: 9.3 mg/dL (ref 8.9–10.3)
Chloride: 110 mmol/L (ref 98–111)
Creatinine, Ser: 0.86 mg/dL (ref 0.44–1.00)
GFR calc Af Amer: >60 mL/min (ref >60)
GFR calc non Af Amer: >60 mL/min (ref >60)
Glucose, Bld: 108 mg/dL — ABNORMAL HIGH (ref 70–99)
Potassium: 3.9 mmol/L (ref 3.5–5.1)
Sodium: 140 mmol/L (ref 135–145)
Total Bilirubin: 0.4 mg/dL (ref 0.3–1.2)
Total Protein: 7.7 g/dL (ref 6.5–8.1)

## 2019-09-02 MED ORDER — SENNOSIDES-DOCUSATE SODIUM 8.6-50 MG PO TABS: 2.0000 | ORAL_TABLET | Freq: Every day | ORAL | 0 refills | Status: AC

## 2019-09-02 NOTE — ED Provider Notes (Signed)
[facility] EMERGENCY DEPARTMENT Provider Note   CSN: [id number] Arrival date & time: 09/02/19  1002     History Chief Complaint  Patient presents with  . Headache  . Constipation  . Nausea    Karen Best is a 64 y.o. female.  HPI    Patient presents with multiple complaints.  Seems as though her primary concern today is that she may have had a fall.  She acknowledges ongoing generalized weakness, headache, states that these are unchanged, though they are of greater concern to her today.  She notes that with her persistent weakness, dependency on a cane for ambulation she has had a frequent falls as well.  She may have had a fall in the past today, though this is not definite. She presents today with concern, as above, for weakness, headache, falls, initially states that her right side hurts, but during physical exam states that her left side hurts, cannot specify where the pain is located.  Does not seem as there is new fever, dyspnea that is changed from baseline. Patient is a known cigarette smoker. She denies recent change in medication, diet, and does not seem as though she took any pain medication for relief.    Past Medical History:  Diagnosis Date  . Asthma   . Bipolar 1 disorder ([place])   . Fibromyalgia   . Schizophrenia ([place])   . Tobacco abuse     Patient Active Problem List   Diagnosis Date Noted  . Lithium toxicity 02/16/2019  . Bipolar 1 disorder ([place]) 02/16/2019  . Hyperkalemia 02/16/2019  . Acute confusion 12/29/2018  . Toxic metabolic encephalopathy [id number]  . Dysarthria 12/23/2018  . Elevated blood pressure reading 12/23/2018  . Asthma 12/23/2018  . Delirium due to another medical condition   . UTI (urinary tract infection) 12/06/2018  . Acute metabolic encephalopathy 123XX123  . 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 Psychosis ([place]) 09/16/2013  . Schizophrenia [facility])     Past Surgical History:  Procedure  Laterality Date  . CESAREAN SECTION    . TUBAL LIGATION       OB History   No obstetric history on file.     Family History  Problem Relation Age of Onset  . Liver cancer Neg Hx   . Liver disease Neg Hx     Social History   Tobacco Use  . Smoking status: Current Every Day Smoker    Packs/day: 0.50    Types: Cigarettes  . Smokeless tobacco: Never Used  Substance Use Topics  . Alcohol use: Yes    Comment: occ  . Drug use: Yes    Types: Cocaine    Comment: crack cocaine (last use 10/25/2018)    Home Medications Prior to Admission medications   Medication Sig Start Date End Date Taking? Authorizing Provider  acetaminophen (TYLENOL) 325 MG tablet Take 650 mg by mouth every 6 (six) hours as needed for mild pain or moderate pain.    [provider]  cetirizine (ZYRTEC) 10 MG tablet Take 10 mg by mouth daily.    [provider]  cyclobenzaprine (FLEXERIL) 10 MG tablet Take 1 tablet (10 mg total) by mouth 3 (three) times daily as needed for muscle spasms. 08/12/19   Alveria Apley, PA-C  famotidine (PEPCID) 40 MG tablet Take 40 mg by mouth 2 (two) times daily.    [provider]  hydrOXYzine (ATARAX/VISTARIL) 50 MG tablet Take 50 mg by mouth at bedtime.  [provider]  ibuprofen (ADVIL) 200 MG tablet Take 400 mg by mouth every 6 (six) hours as needed for headache or mild pain.    [provider]  lidocaine (LIDODERM) 5 % Place 1 patch onto the skin daily. Remove & Discard patch within 12 hours or as directed by MD 06/10/19   Jacqlyn Larsen, PA-C  lithium carbonate (LITHOBID) 300 MG CR tablet Take 1 tablet (300 mg total) by mouth 2 (two) times daily. 02/17/19 03/19/19  Elodia Florence., MD  OLANZapine (ZYPREXA) 10 MG tablet Take 10 mg by mouth daily.     [provider]  OLANZapine (ZYPREXA) 20 MG tablet Take 1 tablet (20 mg total) by mouth at bedtime. 12/31/18 03/01/19  Cherylann Ratel A, DO  Valbenazine Tosylate (INGREZZA) 80  MG CAPS Take 1 tablet by mouth at bedtime.     [provider]    Allergies    Patient has no known allergies.  Review of Systems   Review of Systems  Constitutional:       Per HPI, otherwise negative  HENT:       Per HPI, otherwise negative  Respiratory:       Per HPI, otherwise negative  Cardiovascular:       Per HPI, otherwise negative  Gastrointestinal: Negative for vomiting.  Endocrine:       Negative aside from HPI  Genitourinary:       Neg aside from HPI   Musculoskeletal:       Per HPI, otherwise negative  Skin: Negative.   Neurological: Positive for weakness and headaches. Negative for syncope.  Psychiatric/Behavioral:       No changes, per patient    Physical Exam Updated Vital Signs BP 132/79   Pulse 64   Temp 98.8 F (37.1 C) (Oral)   Resp 13   SpO2 100%   Physical Exam Vitals and nursing note reviewed.  Constitutional:      General: She is not in acute distress.    Appearance: She is well-developed.  HENT:     Head: Normocephalic and atraumatic.  Eyes:     Conjunctiva/sclera: Conjunctivae normal.  Cardiovascular:     Rate and Rhythm: Normal rate and regular rhythm.  Pulmonary:     Effort: Pulmonary effort is normal. No respiratory distress.     Breath sounds: Normal breath sounds. No stridor.  Abdominal:     General: There is no distension.     Tenderness: There is no abdominal tenderness. There is no guarding.  Musculoskeletal:     Comments: Patient moves all extremities spontaneously, and to command.  No gross deformities.  Pelvis is stable, patient flexes each hip independently, without apparent deficit.  Skin:    General: Skin is warm and dry.  Neurological:     Mental Status: She is alert and oriented to person, place, and time.     Cranial Nerves: No cranial nerve deficit.  Psychiatric:        Mood and Affect: Affect is blunt.     ED Results / Procedures / Treatments   Labs (all labs ordered are listed, but only abnormal  results are displayed) Labs Reviewed  COMPREHENSIVE METABOLIC PANEL - Abnormal; Notable for the following components:      Result Value   Glucose, Bld 108 (*)    BUN <5 (*)    All other components within normal limits  CBC WITH DIFFERENTIAL/PLATELET - Abnormal; Notable for the following components:   RBC 5.55 (*)  MCV 77.3 (*)    MCH 22.9 (*)    MCHC 29.6 (*)    Eosinophils Absolute 0.6 (*)    All other components within normal limits   Radiology DG Pelvis Portable  Result Date: 09/02/2019 CLINICAL DATA:  Pelvic pain after a fall yesterday. Initial encounter. EXAM: PORTABLE PELVIS 1-2 VIEWS COMPARISON:  CT abdomen and pelvis 07/18/2019. FINDINGS: There is no evidence of pelvic fracture or diastasis. No pelvic bone lesions are seen. IMPRESSION: Negative exam. Electronically Signed   By: Inge Rise M.D.   On: 09/02/2019 10:57    Procedures Procedures (including critical care time)  Medications Ordered in ED Medications - No data to display  ED Course  I have reviewed the triage vital signs and the nursing notes.  Pertinent labs & imaging results that were available during my care of the patient were reviewed by me and considered in my medical decision making (see chart for details). Chart review notable for 8 prior ED visits in the past 6 months including the most recent months with similar presentations.      MDM Rules/Calculators/A&P                      12:28 PM Patient ambulatory, in no distress.  Labs reviewed, discussed with the patient.  Findings notable for no substantial lecture light abnormalities, no leukocytosis, and absent fever, no evidence for overt infection, some suspicion for the patient's acute on chronic issues contributing to today's presentation. With no evidence for fracture, preserved ambulatory status, no indication for additional advanced imaging.  Patient appropriate for discharge with outpatient follow-up. Final Clinical Impression(s) / ED  Diagnoses Final diagnoses:  Weakness     Carmin Muskrat, MD 09/02/19 1229

## 2019-09-02 NOTE — Discharge Instructions (Addendum)
As discussed, your evaluation today has been largely reassuring.  But, it is important that you monitor your condition carefully, and do not hesitate to return to the ED if you develop new, or concerning changes in your condition. ? ?Otherwise, please follow-up with your physician for appropriate ongoing care. ? ?

## 2019-09-02 NOTE — ED Triage Notes (Signed)
Pt arrived vis [facility] d/t c/o HA, nausea & constipation since yesterday, she states that she has not seen her PCP, EMS reports some confusion. Upon arrival to ED pt is A/Ox4, verbal and able to make needs known.

## 2019-09-12 ENCOUNTER — Encounter (HOSPITAL_COMMUNITY): Payer: Self-pay

## 2019-09-12 ENCOUNTER — Emergency Department (HOSPITAL_COMMUNITY)
Admission: EM | Admit: 2019-09-12 | Discharge: 2019-09-12 | Disposition: A | Discharge status: Home / Self Care | Payer: Medicaid Other | Attending: Emergency Medicine | Admitting: Emergency Medicine

## 2019-09-12 ENCOUNTER — Other Ambulatory Visit: Payer: Self-pay

## 2019-09-12 DIAGNOSIS — J45909 Unspecified asthma, uncomplicated: Secondary | ICD-10-CM | POA: Diagnosis not present

## 2019-09-12 DIAGNOSIS — N390 Urinary tract infection, site not specified: Secondary | ICD-10-CM | POA: Diagnosis not present

## 2019-09-12 DIAGNOSIS — Z79899 Other long term (current) drug therapy: Secondary | ICD-10-CM | POA: Diagnosis not present

## 2019-09-12 DIAGNOSIS — F17210 Nicotine dependence, cigarettes, uncomplicated: Secondary | ICD-10-CM | POA: Insufficient documentation

## 2019-09-12 DIAGNOSIS — N898 Other specified noninflammatory disorders of vagina: Secondary | ICD-10-CM | POA: Diagnosis present

## 2019-09-12 LAB — URINALYSIS, ROUTINE W REFLEX MICROSCOPIC
Bilirubin Urine: NEGATIVE
Glucose, UA: NEGATIVE mg/dL
Ketones, ur: NEGATIVE mg/dL
Nitrite: NEGATIVE
Protein, ur: NEGATIVE mg/dL
Specific Gravity, Urine: 1.004 — ABNORMAL LOW (ref 1.005–1.030)
WBC, UA: >50 WBC/hpf — ABNORMAL HIGH (ref 0–5)
pH: 7 (ref 5.0–8.0)

## 2019-09-12 LAB — WET PREP, GENITAL
Clue Cells Wet Prep HPF POC: NONE SEEN
Sperm: NONE SEEN
Trich, Wet Prep: NONE SEEN
Yeast Wet Prep HPF POC: NONE SEEN

## 2019-09-12 LAB — HIV ANTIBODY (ROUTINE TESTING W REFLEX): HIV Screen 4th Generation wRfx: NONREACTIVE

## 2019-09-12 MED ORDER — CEPHALEXIN 500 MG PO CAPS: 500.0000 mg | ORAL_CAPSULE | Freq: Two times a day (BID) | ORAL | 0 refills | Status: DC

## 2019-09-12 NOTE — ED Notes (Signed)
Patient verbalizes understanding of discharge instructions . Opportunity for questions and answers were provided . Armband removed by staff ,Pt discharged from ED. W/C  offered at D/C  and Declined W/C at D/C and was escorted to lobby by RN.  

## 2019-09-12 NOTE — ED Triage Notes (Signed)
Pt c.o vaginal discharge for the past 3 days with itching and pain, denies vaginal bleeding

## 2019-09-12 NOTE — Discharge Instructions (Signed)
Take Keflex twice a day for 5 days for bladder infection. Drink plenty of water  Please follow up with your doctor Return if you are worsening

## 2019-09-12 NOTE — ED Provider Notes (Signed)
[facility] EMERGENCY DEPARTMENT Provider Note   CSN: [id number] Arrival date & time: 09/12/19  [id number]     History Chief Complaint  Patient presents with  . Vaginal Discharge    Karen Best is a 64 y.o. female who presents with dysuria and vaginal discharge. Pt states she wants to be checked for a yeast infection and STD. She is sexually active with a new partner and did not use protection. She reports thick creamy vaginal discharge along with vaginal burning and burning with urination. She has some suprapubic discomfort as well. She denies any recent antibiotic use or hx of diabetes. She denies fever, chills, flank pain, N/V, urinary frequency, hesitancy, or dysuria. No vaginal bleeding.  HPI     Past Medical History:  Diagnosis Date  . Asthma   . Bipolar 1 disorder ([place])   . Fibromyalgia   . Schizophrenia ([place])   . Tobacco abuse     Patient Active Problem List   Diagnosis Date Noted  . Lithium toxicity 02/16/2019  . Bipolar 1 disorder ([place]) 02/16/2019  . Hyperkalemia 02/16/2019  . Acute confusion 12/29/2018  . Toxic metabolic encephalopathy [id number]  . Dysarthria 12/23/2018  . Elevated blood pressure reading 12/23/2018  . Asthma 12/23/2018  . Delirium due to another medical condition   . UTI (urinary tract infection) 12/06/2018  . Acute metabolic encephalopathy 123XX123  . 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 Psychosis ([place]) 09/16/2013  . Schizophrenia [facility])     Past Surgical History:  Procedure Laterality Date  . CESAREAN SECTION    . TUBAL LIGATION       OB History   No obstetric history on file.     Family History  Problem Relation Age of Onset  . Liver cancer Neg Hx   . Liver disease Neg Hx     Social History   Tobacco Use  . Smoking status: Current Every Day Smoker    Packs/day: 0.50    Types: Cigarettes  . Smokeless tobacco: Never Used  Substance Use Topics  . Alcohol use: Yes    Comment: occ  .  Drug use: Yes    Types: Cocaine    Comment: crack cocaine (last use 10/25/2018)    Home Medications Prior to Admission medications   Medication Sig Start Date End Date Taking? Authorizing Provider  acetaminophen (TYLENOL) 325 MG tablet Take 650 mg by mouth every 6 (six) hours as needed for mild pain or moderate pain.    [provider]  cetirizine (ZYRTEC) 10 MG tablet Take 10 mg by mouth daily.    [provider]  cyclobenzaprine (FLEXERIL) 10 MG tablet Take 1 tablet (10 mg total) by mouth 3 (three) times daily as needed for muscle spasms. 08/12/19   Alveria Apley, PA-C  famotidine (PEPCID) 40 MG tablet Take 40 mg by mouth 2 (two) times daily.    [provider]  hydrOXYzine (ATARAX/VISTARIL) 50 MG tablet Take 50 mg by mouth at bedtime.    [provider]  ibuprofen (ADVIL) 200 MG tablet Take 400 mg by mouth every 6 (six) hours as needed for headache or mild pain.    [provider]  lidocaine (LIDODERM) 5 % Place 1 patch onto the skin daily. Remove & Discard patch within 12 hours or as directed by MD 06/10/19   Jacqlyn Larsen, PA-C  lithium carbonate (LITHOBID) 300 MG CR tablet Take 1 tablet (300 mg total) by mouth 2 (two) times daily. 02/17/19  03/19/19  Elodia Florence., MD  OLANZapine (ZYPREXA) 10 MG tablet Take 10 mg by mouth daily.     [provider]  OLANZapine (ZYPREXA) 20 MG tablet Take 1 tablet (20 mg total) by mouth at bedtime. 12/31/18 03/01/19  Cherylann Ratel A, DO  senna-docusate (SENOKOT-S) 8.6-50 MG tablet Take 2 tablets by mouth daily for 10 days. 09/02/19 09/12/19  Carmin Muskrat, MD  Valbenazine Tosylate [facility]) 80 MG CAPS Take 1 tablet by mouth at bedtime.     [provider]    Allergies    Patient has no known allergies.  Review of Systems   Review of Systems  Constitutional: Negative for fever.  Gastrointestinal: Negative for abdominal pain, nausea and vomiting.  Genitourinary: Positive for dysuria,  pelvic pain, vaginal discharge and vaginal pain. Negative for difficulty urinating, dyspareunia, flank pain, hematuria and vaginal bleeding.    Physical Exam Updated Vital Signs BP (!) 180/95 (BP Location: Left Arm)   Pulse (!) 106   Temp 98.7 F (37.1 C) (Oral)   Resp 16   Ht 5\' 5"  (1.651 m)   Wt 68 kg   SpO2 97%   BMI 24.96 kg/m   Physical Exam Vitals and nursing note reviewed.  Constitutional:      General: She is not in acute distress.    Appearance: Normal appearance. She is well-developed. She is not ill-appearing.  HENT:     Head: Normocephalic and atraumatic.  Eyes:     General: No scleral icterus.       Right eye: No discharge.        Left eye: No discharge.     Conjunctiva/sclera: Conjunctivae normal.     Pupils: Pupils are equal, round, and reactive to light.  Cardiovascular:     Rate and Rhythm: Normal rate and regular rhythm.  Pulmonary:     Effort: Pulmonary effort is normal. No respiratory distress.     Breath sounds: Normal breath sounds.  Abdominal:     General: There is no distension.     Palpations: Abdomen is soft.     Tenderness: There is no abdominal tenderness.  Genitourinary:    Comments: Pelvic: No inguinal lymphadenopathy or inguinal hernia noted. Normal external genitalia. No pain with speculum insertion. Closed cervical os with normal appearance - no rash or lesions. No significant discharge or bleeding noted from cervix or in vaginal vault. On bimanual examination no adnexal tenderness or cervical motion tenderness. Chaperone present during exam.   Musculoskeletal:     Cervical back: Normal range of motion.  Skin:    General: Skin is warm and dry.  Neurological:     Mental Status: She is alert and oriented to person, place, and time.  Psychiatric:        Behavior: Behavior normal.     ED Results / Procedures / Treatments   Labs (all labs ordered are listed, but only abnormal results are displayed) Labs Reviewed  WET PREP, GENITAL -  Abnormal; Notable for the following components:      Result Value   WBC, Wet Prep HPF POC FEW (*)    All other components within normal limits  URINALYSIS, ROUTINE W REFLEX MICROSCOPIC - Abnormal; Notable for the following components:   APPearance HAZY (*)    Specific Gravity, Urine 1.004 (*)    Hgb urine dipstick SMALL (*)    Leukocytes,Ua LARGE (*)    WBC, UA >50 (*)    Bacteria, UA MANY (*)    All  other components within normal limits  URINE CULTURE  HIV ANTIBODY (ROUTINE TESTING W REFLEX)  RPR  GC/CHLAMYDIA PROBE AMP ([place]) NOT AT [facility]    EKG None  Radiology No results found.  Procedures Procedures (including critical care time)  Medications Ordered in ED Medications - No data to display  ED Course  I have reviewed the triage vital signs and the nursing notes.  Pertinent labs & imaging results that were available during my care of the patient were reviewed by me and considered in my medical decision making (see chart for details).  64 year old female presents with dysuria and vaginal discharge for 4 days.  She is hypertensive and mildly tachycardic but otherwise vital signs are reassuring.  On exam she is calm and comfortable appearing.  Abdomen is soft and nontender.  Pelvic exam was performed and is unremarkable.  Wet prep is negative.  Urine shows UTI.  Will send off a urine culture and treat empirically with Keflex.  Will hold off on any empiric STD testing at this time and wait for cultures.  MDM Rules/Calculators/A&P                       Final Clinical Impression(s) / ED Diagnoses Final diagnoses:  Urinary tract infection without hematuria, site unspecified    Rx / DC Orders ED Discharge Orders    None       Recardo Evangelist, PA-C 09/12/19 [place], [place], DO 09/12/19 1456

## 2019-09-13 LAB — GC/CHLAMYDIA PROBE AMP (~~LOC~~) NOT AT ARMC
Chlamydia: NEGATIVE
Comment: NEGATIVE
Comment: NORMAL
Neisseria Gonorrhea: NEGATIVE

## 2019-09-13 LAB — RPR: RPR Ser Ql: NONREACTIVE

## 2019-09-14 LAB — URINE CULTURE: Culture: >=100000 — AB

## 2019-09-15 ENCOUNTER — Telehealth: Payer: Self-pay | Admitting: *Deleted

## 2019-09-15 NOTE — Telephone Encounter (Signed)
Post ED Visit - Positive Culture Follow-up  Culture report reviewed by antimicrobial stewardship pharmacist: [facility] Team []  Elenor Quinones, Pharm.D. []  Heide Guile, Pharm.D., BCPS AQ-ID []  Parks Neptune, Pharm.D., BCPS []  Alycia Rossetti, Pharm.D., BCPS []  [place], Pharm.D., BCPS, AAHIVP []  Legrand Como, Pharm.D., BCPS, AAHIVP []  Salome Arnt, PharmD, BCPS []  Johnnette Gourd, PharmD, BCPS []  Hughes Better, PharmD, BCPS []  Leeroy Cha, PharmD []  Laqueta Linden, PharmD, BCPS []  Albertina Parr, PharmD  Acey Lav, [place] Team []  Leodis Sias, PharmD []  Lindell Spar, PharmD []  Royetta Asal, PharmD []  Graylin Shiver, Rph []  Rema Fendt) Glennon Mac, PharmD []  Arlyn Dunning, PharmD []  Netta Cedars, PharmD []  Dia Sitter, PharmD []  Leone Haven, PharmD []  Gretta Arab, PharmD []  Theodis Shove, PharmD []  Peggyann Juba, PharmD []  Reuel Boom, PharmD   Positive urine culture Treated with Cephalexin, organism sensitive to the same and no further patient follow-up is required at this time.  Harlon Flor [facility] 09/15/2019, 12:26 PM

## 2019-09-20 ENCOUNTER — Encounter (HOSPITAL_COMMUNITY): Payer: Self-pay

## 2019-09-20 ENCOUNTER — Emergency Department (HOSPITAL_COMMUNITY): Payer: Medicaid Other

## 2019-09-20 ENCOUNTER — Emergency Department (HOSPITAL_COMMUNITY)
Admission: EM | Admit: 2019-09-20 | Discharge: 2019-09-21 | Disposition: A | Discharge status: Home / Self Care | Payer: Medicaid Other | Attending: Emergency Medicine | Admitting: Emergency Medicine

## 2019-09-20 DIAGNOSIS — F17210 Nicotine dependence, cigarettes, uncomplicated: Secondary | ICD-10-CM | POA: Insufficient documentation

## 2019-09-20 DIAGNOSIS — M25552 Pain in left hip: Secondary | ICD-10-CM | POA: Diagnosis present

## 2019-09-20 DIAGNOSIS — Z711 Person with feared health complaint in whom no diagnosis is made: Secondary | ICD-10-CM | POA: Insufficient documentation

## 2019-09-20 DIAGNOSIS — F250 Schizoaffective disorder, bipolar type: Secondary | ICD-10-CM | POA: Diagnosis not present

## 2019-09-20 DIAGNOSIS — W1830XA Fall on same level, unspecified, initial encounter: Secondary | ICD-10-CM | POA: Insufficient documentation

## 2019-09-20 DIAGNOSIS — Z79899 Other long term (current) drug therapy: Secondary | ICD-10-CM | POA: Insufficient documentation

## 2019-09-20 DIAGNOSIS — W19XXXA Unspecified fall, initial encounter: Secondary | ICD-10-CM

## 2019-09-20 LAB — PROTIME-INR
INR: 1 (ref 0.8–1.2)
Prothrombin Time: 12.8 seconds (ref 11.4–15.2)

## 2019-09-20 LAB — CBC
HCT: 41.4 % (ref 36.0–46.0)
Hemoglobin: 12.5 g/dL (ref 12.0–15.0)
MCH: 23.2 pg — ABNORMAL LOW (ref 26.0–34.0)
MCHC: 30.2 g/dL (ref 30.0–36.0)
MCV: 76.8 fL — ABNORMAL LOW (ref 80.0–100.0)
Platelets: 319 10*3/uL (ref 150–400)
RBC: 5.39 MIL/uL — ABNORMAL HIGH (ref 3.87–5.11)
RDW: 14.4 % (ref 11.5–15.5)
WBC: 7.2 10*3/uL (ref 4.0–10.5)
nRBC: 0 % (ref 0.0–0.2)

## 2019-09-20 LAB — I-STAT CHEM 8, ED
BUN: 6 mg/dL — ABNORMAL LOW (ref 8–23)
Calcium, Ion: 1.22 mmol/L (ref 1.15–1.40)
Chloride: 106 mmol/L (ref 98–111)
Creatinine, Ser: 0.6 mg/dL (ref 0.44–1.00)
Glucose, Bld: 106 mg/dL — ABNORMAL HIGH (ref 70–99)
HCT: 40 % (ref 36.0–46.0)
Hemoglobin: 13.6 g/dL (ref 12.0–15.0)
Potassium: 3.7 mmol/L (ref 3.5–5.1)
Sodium: 140 mmol/L (ref 135–145)
TCO2: 26 mmol/L (ref 22–32)

## 2019-09-20 LAB — DIFFERENTIAL
Abs Immature Granulocytes: 0.01 10*3/uL (ref 0.00–0.07)
Basophils Absolute: 0 10*3/uL (ref 0.0–0.1)
Basophils Relative: 0 %
Eosinophils Absolute: 0.6 10*3/uL — ABNORMAL HIGH (ref 0.0–0.5)
Eosinophils Relative: 8 %
Immature Granulocytes: 0 %
Lymphocytes Relative: 32 %
Lymphs Abs: 2.3 10*3/uL (ref 0.7–4.0)
Monocytes Absolute: 0.6 10*3/uL (ref 0.1–1.0)
Monocytes Relative: 8 %
Neutro Abs: 3.7 10*3/uL (ref 1.7–7.7)
Neutrophils Relative %: 52 %

## 2019-09-20 LAB — COMPREHENSIVE METABOLIC PANEL
ALT: 31 U/L (ref 0–44)
AST: 26 U/L (ref 15–41)
Albumin: 3.5 g/dL (ref 3.5–5.0)
Alkaline Phosphatase: 62 U/L (ref 38–126)
Anion gap: 8 (ref 5–15)
BUN: 6 mg/dL — ABNORMAL LOW (ref 8–23)
CO2: 25 mmol/L (ref 22–32)
Calcium: 9.4 mg/dL (ref 8.9–10.3)
Chloride: 107 mmol/L (ref 98–111)
Creatinine, Ser: 0.75 mg/dL (ref 0.44–1.00)
GFR calc Af Amer: >60 mL/min (ref >60)
GFR calc non Af Amer: >60 mL/min (ref >60)
Glucose, Bld: 111 mg/dL — ABNORMAL HIGH (ref 70–99)
Potassium: 3.9 mmol/L (ref 3.5–5.1)
Sodium: 140 mmol/L (ref 135–145)
Total Bilirubin: 0.2 mg/dL — ABNORMAL LOW (ref 0.3–1.2)
Total Protein: 7.4 g/dL (ref 6.5–8.1)

## 2019-09-20 LAB — APTT: aPTT: 30 seconds (ref 24–36)

## 2019-09-20 LAB — ETHANOL: Alcohol, Ethyl (B): <10 mg/dL (ref <10)

## 2019-09-20 LAB — CBG MONITORING, ED: Glucose-Capillary: 101 mg/dL — ABNORMAL HIGH (ref 70–99)

## 2019-09-20 LAB — LITHIUM LEVEL: Lithium Lvl: 0.21 mmol/L — ABNORMAL LOW (ref 0.60–1.20)

## 2019-09-20 NOTE — ED Provider Notes (Signed)
[facility] EMERGENCY DEPARTMENT Provider Note   CSN: [id number] Arrival date & time: 09/20/19  1411     History Chief Complaint  Patient presents with  . Weakness    Karen Best is a 64 y.o. female.  Patient with past medical history notable for schizophrenia, presents to the emergency department with a chief complaint of fall.  She states that she fell yesterday and is complaining of left hip pain.  She denies any difficulty with ambulating.  Denies any other injuries.  She was brought in by EMS for reported slurred speech.  Patient states that she loses her voice from time to time when questioned about this directly, but she denies any changes in her speech now.  She denies any complaints currently, and asks about going home.  The history is provided by the patient. No language interpreter was used.       Past Medical History:  Diagnosis Date  . Asthma   . Bipolar 1 disorder ([place])   . Fibromyalgia   . Schizophrenia ([place])   . Tobacco abuse     Patient Active Problem List   Diagnosis Date Noted  . Lithium toxicity 02/16/2019  . Bipolar 1 disorder ([place]) 02/16/2019  . Hyperkalemia 02/16/2019  . Acute confusion 12/29/2018  . Toxic metabolic encephalopathy Q000111Q  . Dysarthria 12/23/2018  . Elevated blood pressure reading 12/23/2018  . Asthma 12/23/2018  . Delirium due to another medical condition   . UTI (urinary tract infection) 12/06/2018  . Acute metabolic encephalopathy 123XX123  . 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 Psychosis ([place]) 09/16/2013  . Schizophrenia [facility])     Past Surgical History:  Procedure Laterality Date  . CESAREAN SECTION    . TUBAL LIGATION       OB History   No obstetric history on file.     Family History  Problem Relation Age of Onset  . Liver cancer Neg Hx   . Liver disease Neg Hx     Social History   Tobacco Use  . Smoking status: Current Every Day Smoker    Packs/day: 0.50   Types: Cigarettes  . Smokeless tobacco: Never Used  Substance Use Topics  . Alcohol use: Yes    Comment: occ  . Drug use: Yes    Types: Cocaine    Comment: crack cocaine (last use 10/25/2018)    Home Medications Prior to Admission medications   Medication Sig Start Date End Date Taking? Authorizing Provider  acetaminophen (TYLENOL) 325 MG tablet Take 650 mg by mouth every 6 (six) hours as needed for mild pain or moderate pain.    [provider]  cephALEXin (KEFLEX) 500 MG capsule Take 1 capsule (500 mg total) by mouth 2 (two) times daily. 09/12/19   Recardo Evangelist, PA-C  cetirizine (ZYRTEC) 10 MG tablet Take 10 mg by mouth daily.    [provider]  cyclobenzaprine (FLEXERIL) 10 MG tablet Take 1 tablet (10 mg total) by mouth 3 (three) times daily as needed for muscle spasms. 08/12/19   Alveria Apley, PA-C  famotidine (PEPCID) 40 MG tablet Take 40 mg by mouth 2 (two) times daily.    [provider]  hydrOXYzine (ATARAX/VISTARIL) 50 MG tablet Take 50 mg by mouth at bedtime.    [provider]  ibuprofen (ADVIL) 200 MG tablet Take 400 mg by mouth every 6 (six) hours as needed for headache or mild pain.    [provider]  lidocaine (  LIDODERM) 5 % Place 1 patch onto the skin daily. Remove & Discard patch within 12 hours or as directed by MD 06/10/19   Jacqlyn Larsen, PA-C  lithium carbonate (LITHOBID) 300 MG CR tablet Take 1 tablet (300 mg total) by mouth 2 (two) times daily. 02/17/19 03/19/19  Elodia Florence., MD  OLANZapine (ZYPREXA) 10 MG tablet Take 10 mg by mouth daily.     [provider]  OLANZapine (ZYPREXA) 20 MG tablet Take 1 tablet (20 mg total) by mouth at bedtime. 12/31/18 03/01/19  Cherylann Ratel A, DO  Valbenazine Tosylate (INGREZZA) 80 MG CAPS Take 1 tablet by mouth at bedtime.     [provider]    Allergies    Patient has no known allergies.  Review of Systems   Review of Systems  All other systems  reviewed and are negative.   Physical Exam Updated Vital Signs BP (!) 148/79 (BP Location: Left Arm)   Pulse 61   Temp 98.3 F (36.8 C) (Oral)   Resp 13   Ht 5\' 5"  (1.651 m)   Wt 68 kg   SpO2 99%   BMI 24.96 kg/m   Physical Exam Vitals and nursing note reviewed.  Constitutional:      General: She is not in acute distress.    Appearance: She is well-developed.  HENT:     Head: Normocephalic and atraumatic.  Eyes:     Conjunctiva/sclera: Conjunctivae normal.  Cardiovascular:     Rate and Rhythm: Normal rate and regular rhythm.     Heart sounds: No murmur.  Pulmonary:     Effort: Pulmonary effort is normal. No respiratory distress.     Breath sounds: Normal breath sounds.  Abdominal:     Palpations: Abdomen is soft.     Tenderness: There is no abdominal tenderness.  Musculoskeletal:     Cervical back: Neck supple.     Comments: Ambulates with normal gait No bony abnormality or deformity  Skin:    General: Skin is warm and dry.  Neurological:     Mental Status: She is alert and oriented to person, place, and time.     Comments: Cranial nerves grossly intact Moves all extremities Sensation intact throughout  Psychiatric:        Mood and Affect: Mood normal.        Behavior: Behavior normal.     ED Results / Procedures / Treatments   Labs (all labs ordered are listed, but only abnormal results are displayed) Labs Reviewed  CBC - Abnormal; Notable for the following components:      Result Value   RBC 5.39 (*)    MCV 76.8 (*)    MCH 23.2 (*)    All other components within normal limits  DIFFERENTIAL - Abnormal; Notable for the following components:   Eosinophils Absolute 0.6 (*)    All other components within normal limits  COMPREHENSIVE METABOLIC PANEL - Abnormal; Notable for the following components:   Glucose, Bld 111 (*)    BUN 6 (*)    Total Bilirubin 0.2 (*)    All other components within normal limits  LITHIUM LEVEL - Abnormal; Notable for the  following components:   Lithium Lvl 0.21 (*)    All other components within normal limits  CBG MONITORING, ED - Abnormal; Notable for the following components:   Glucose-Capillary 101 (*)    All other components within normal limits  I-STAT CHEM 8, ED - Abnormal; Notable for the following  components:   BUN 6 (*)    Glucose, Bld 106 (*)    All other components within normal limits  ETHANOL  PROTIME-INR  APTT    EKG EKG Interpretation  Date/Time:  Tuesday Sep 20 2019 22:31:09 EDT Ventricular Rate:  65 PR Interval:    QRS Duration: 164 QT Interval:  412 QTC Calculation: 429 R Axis:   -51 Text Interpretation: Sinus rhythm Baseline wander in lead(s) V2 V3 V4 V5 V6 Artifact When compared with ECG of 07/18/2019, No significant change was found Confirmed by Delora Fuel ([id number]) on 09/20/2019 11:58:03 PM   Radiology CT HEAD WO CONTRAST  Result Date: 09/20/2019 CLINICAL DATA:  Head trauma EXAM: CT HEAD WITHOUT CONTRAST TECHNIQUE: Contiguous axial images were obtained from the base of the skull through the vertex without intravenous contrast. COMPARISON:  CT brain 05/30/2019 FINDINGS: Brain: No acute territorial infarction, hemorrhage, or intracranial mass. Mild atrophy. Minimal hypodensity in the white matter consistent with chronic small vessel ischemic change. Stable ventricle size Vascular: No hyperdense vessels.  No unexpected calcification Skull: No fracture.  Right mastoid effusion Sinuses/Orbits: No acute finding. Other: None IMPRESSION: 1. No CT evidence for acute intracranial abnormality. 2. Atrophy and mild chronic small vessel ischemic change of the white matter Electronically Signed   By: Donavan Foil M.D.   On: 09/20/2019 23:29    Procedures Procedures (including critical care time)  Medications Ordered in ED Medications - No data to display  ED Course  I have reviewed the triage vital signs and the nursing notes.  Pertinent labs & imaging results that were available  during my care of the patient were reviewed by me and considered in my medical decision making (see chart for details).    MDM Rules/Calculators/A&P                      Patient complaining of a fall.  She states that she fell a few days ago.  She states that she had some hip pain, but feels fine now.  She is able to ambulate in the emergency department without any difficulty.  She has no numbness, weakness, or tingling.  No bony deformity or abnormality.  Nursing note also comments that she might of had slurred speech, but she denies this to me.  She states that sometimes her voice stops.  She does have history of schizophrenia and psychosis.  I will check CT and labs.  CT is reassuring, no intracranial abnormality.  Laboratory work-up is reassuring.  Lithium level is not high.  Patient states that she is ready to go home.  I do not feel that any additional work-up or consultation is indicated at this time.  Patient is stable and ready for discharge. Final Clinical Impression(s) / ED Diagnoses Final diagnoses:  Fall, initial encounter    Rx / DC Orders ED Discharge Orders    None       Montine Circle, PA-C 09/21/19 [id number]    Gareth Morgan, MD 09/21/19 1029

## 2019-09-20 NOTE — ED Triage Notes (Signed)
Pt bib ems for slurred speech, pts speech is at baseline, pt comes here frequently. No neuro deficits with EMS.

## 2019-09-20 NOTE — ED Notes (Signed)
Informed patient we need urine. States she cant give a sample right now and will use her call bell when she can

## 2019-09-21 NOTE — ED Notes (Signed)
Sign-pad unavailabel upon discharge. Pt provided with and verbalizes understanding of discharge instructions. A&ox4, ambulatory upon discharge. Pt arm-band removed

## 2019-10-01 ENCOUNTER — Emergency Department (HOSPITAL_COMMUNITY): Payer: Medicaid Other

## 2019-10-01 ENCOUNTER — Inpatient Hospital Stay (HOSPITAL_COMMUNITY): Payer: Medicaid Other

## 2019-10-01 ENCOUNTER — Encounter (HOSPITAL_COMMUNITY): Payer: Self-pay | Admitting: Emergency Medicine

## 2019-10-01 ENCOUNTER — Inpatient Hospital Stay (HOSPITAL_COMMUNITY)
Admission: EM | Admit: 2019-10-01 | Discharge: 2019-10-07 | DRG: 917 | Disposition: A | Discharge status: Skilled Nursing Facility | Payer: Medicaid Other | Attending: Internal Medicine | Admitting: Internal Medicine

## 2019-10-01 DIAGNOSIS — E785 Hyperlipidemia, unspecified: Secondary | ICD-10-CM | POA: Diagnosis present

## 2019-10-01 DIAGNOSIS — N390 Urinary tract infection, site not specified: Secondary | ICD-10-CM | POA: Diagnosis present

## 2019-10-01 DIAGNOSIS — T405X1A Poisoning by cocaine, accidental (unintentional), initial encounter: Secondary | ICD-10-CM | POA: Diagnosis present

## 2019-10-01 DIAGNOSIS — E860 Dehydration: Secondary | ICD-10-CM | POA: Diagnosis present

## 2019-10-01 DIAGNOSIS — R400 Somnolence: Secondary | ICD-10-CM | POA: Diagnosis not present

## 2019-10-01 DIAGNOSIS — R4701 Aphasia: Secondary | ICD-10-CM | POA: Diagnosis present

## 2019-10-01 DIAGNOSIS — B9620 Unspecified Escherichia coli [E. coli] as the cause of diseases classified elsewhere: Secondary | ICD-10-CM | POA: Diagnosis present

## 2019-10-01 DIAGNOSIS — R7881 Bacteremia: Secondary | ICD-10-CM | POA: Diagnosis present

## 2019-10-01 DIAGNOSIS — F17210 Nicotine dependence, cigarettes, uncomplicated: Secondary | ICD-10-CM | POA: Diagnosis present

## 2019-10-01 DIAGNOSIS — F319 Bipolar disorder, unspecified: Secondary | ICD-10-CM | POA: Diagnosis present

## 2019-10-01 DIAGNOSIS — G8191 Hemiplegia, unspecified affecting right dominant side: Secondary | ICD-10-CM | POA: Diagnosis present

## 2019-10-01 DIAGNOSIS — R4182 Altered mental status, unspecified: Secondary | ICD-10-CM | POA: Diagnosis present

## 2019-10-01 DIAGNOSIS — I639 Cerebral infarction, unspecified: Secondary | ICD-10-CM | POA: Diagnosis present

## 2019-10-01 DIAGNOSIS — F209 Schizophrenia, unspecified: Secondary | ICD-10-CM | POA: Diagnosis present

## 2019-10-01 DIAGNOSIS — L989 Disorder of the skin and subcutaneous tissue, unspecified: Secondary | ICD-10-CM | POA: Diagnosis present

## 2019-10-01 DIAGNOSIS — B957 Other staphylococcus as the cause of diseases classified elsewhere: Secondary | ICD-10-CM | POA: Diagnosis present

## 2019-10-01 DIAGNOSIS — R531 Weakness: Secondary | ICD-10-CM

## 2019-10-01 DIAGNOSIS — R29723 NIHSS score 23: Secondary | ICD-10-CM | POA: Diagnosis present

## 2019-10-01 DIAGNOSIS — E870 Hyperosmolality and hypernatremia: Secondary | ICD-10-CM | POA: Diagnosis present

## 2019-10-01 DIAGNOSIS — W19XXXA Unspecified fall, initial encounter: Secondary | ICD-10-CM | POA: Diagnosis present

## 2019-10-01 DIAGNOSIS — G92 Toxic encephalopathy: Secondary | ICD-10-CM | POA: Diagnosis present

## 2019-10-01 DIAGNOSIS — I6389 Other cerebral infarction: Secondary | ICD-10-CM | POA: Diagnosis not present

## 2019-10-01 DIAGNOSIS — B9561 Methicillin susceptible Staphylococcus aureus infection as the cause of diseases classified elsewhere: Secondary | ICD-10-CM | POA: Diagnosis present

## 2019-10-01 DIAGNOSIS — Z20822 Contact with and (suspected) exposure to covid-19: Secondary | ICD-10-CM | POA: Diagnosis present

## 2019-10-01 DIAGNOSIS — I959 Hypotension, unspecified: Secondary | ICD-10-CM | POA: Diagnosis present

## 2019-10-01 DIAGNOSIS — N179 Acute kidney failure, unspecified: Secondary | ICD-10-CM | POA: Diagnosis present

## 2019-10-01 DIAGNOSIS — F1410 Cocaine abuse, uncomplicated: Secondary | ICD-10-CM | POA: Diagnosis present

## 2019-10-01 DIAGNOSIS — Z79899 Other long term (current) drug therapy: Secondary | ICD-10-CM

## 2019-10-01 DIAGNOSIS — L03113 Cellulitis of right upper limb: Secondary | ICD-10-CM | POA: Diagnosis not present

## 2019-10-01 DIAGNOSIS — I6381 Other cerebral infarction due to occlusion or stenosis of small artery: Secondary | ICD-10-CM | POA: Diagnosis present

## 2019-10-01 DIAGNOSIS — R7401 Elevation of levels of liver transaminase levels: Secondary | ICD-10-CM | POA: Diagnosis present

## 2019-10-01 DIAGNOSIS — R7303 Prediabetes: Secondary | ICD-10-CM | POA: Diagnosis present

## 2019-10-01 DIAGNOSIS — T796XXA Traumatic ischemia of muscle, initial encounter: Secondary | ICD-10-CM | POA: Diagnosis present

## 2019-10-01 DIAGNOSIS — G9349 Other encephalopathy: Secondary | ICD-10-CM | POA: Diagnosis present

## 2019-10-01 DIAGNOSIS — N3000 Acute cystitis without hematuria: Secondary | ICD-10-CM

## 2019-10-01 DIAGNOSIS — R29810 Facial weakness: Secondary | ICD-10-CM | POA: Diagnosis present

## 2019-10-01 LAB — DIFFERENTIAL
Abs Immature Granulocytes: 0.11 10*3/uL — ABNORMAL HIGH (ref 0.00–0.07)
Basophils Absolute: 0 10*3/uL (ref 0.0–0.1)
Basophils Relative: 0 %
Eosinophils Absolute: 0 10*3/uL (ref 0.0–0.5)
Eosinophils Relative: 0 %
Immature Granulocytes: 1 %
Lymphocytes Relative: 7 %
Lymphs Abs: 1 10*3/uL (ref 0.7–4.0)
Monocytes Absolute: 1.2 10*3/uL — ABNORMAL HIGH (ref 0.1–1.0)
Monocytes Relative: 8 %
Neutro Abs: 11.6 10*3/uL — ABNORMAL HIGH (ref 1.7–7.7)
Neutrophils Relative %: 84 %

## 2019-10-01 LAB — RAPID URINE DRUG SCREEN, HOSP PERFORMED
Amphetamines: NOT DETECTED
Barbiturates: NOT DETECTED
Benzodiazepines: NOT DETECTED
Cocaine: POSITIVE — AB
Opiates: NOT DETECTED
Tetrahydrocannabinol: NOT DETECTED

## 2019-10-01 LAB — COMPREHENSIVE METABOLIC PANEL
ALT: 153 U/L — ABNORMAL HIGH (ref 0–44)
AST: 379 U/L — ABNORMAL HIGH (ref 15–41)
Albumin: 3.7 g/dL (ref 3.5–5.0)
Alkaline Phosphatase: 71 U/L (ref 38–126)
Anion gap: 11 (ref 5–15)
BUN: 52 mg/dL — ABNORMAL HIGH (ref 8–23)
CO2: 18 mmol/L — ABNORMAL LOW (ref 22–32)
Calcium: 9.5 mg/dL (ref 8.9–10.3)
Chloride: 116 mmol/L — ABNORMAL HIGH (ref 98–111)
Creatinine, Ser: 1.18 mg/dL — ABNORMAL HIGH (ref 0.44–1.00)
GFR calc Af Amer: 57 mL/min — ABNORMAL LOW (ref >60)
GFR calc non Af Amer: 49 mL/min — ABNORMAL LOW (ref >60)
Glucose, Bld: 142 mg/dL — ABNORMAL HIGH (ref 70–99)
Potassium: 5.2 mmol/L — ABNORMAL HIGH (ref 3.5–5.1)
Sodium: 145 mmol/L (ref 135–145)
Total Bilirubin: 0.8 mg/dL (ref 0.3–1.2)
Total Protein: 8.8 g/dL — ABNORMAL HIGH (ref 6.5–8.1)

## 2019-10-01 LAB — URINALYSIS, ROUTINE W REFLEX MICROSCOPIC
Bilirubin Urine: NEGATIVE
Glucose, UA: NEGATIVE mg/dL
Ketones, ur: 20 mg/dL — AB
Nitrite: NEGATIVE
Protein, ur: 100 mg/dL — AB
Specific Gravity, Urine: 1.018 (ref 1.005–1.030)
WBC, UA: >50 WBC/hpf — ABNORMAL HIGH (ref 0–5)
pH: 6 (ref 5.0–8.0)

## 2019-10-01 LAB — I-STAT CHEM 8, ED
BUN: 61 mg/dL — ABNORMAL HIGH (ref 8–23)
Calcium, Ion: 1.22 mmol/L (ref 1.15–1.40)
Chloride: 117 mmol/L — ABNORMAL HIGH (ref 98–111)
Creatinine, Ser: 1.3 mg/dL — ABNORMAL HIGH (ref 0.44–1.00)
Glucose, Bld: 142 mg/dL — ABNORMAL HIGH (ref 70–99)
HCT: 50 % — ABNORMAL HIGH (ref 36.0–46.0)
Hemoglobin: 17 g/dL — ABNORMAL HIGH (ref 12.0–15.0)
Potassium: 4.9 mmol/L (ref 3.5–5.1)
Sodium: 148 mmol/L — ABNORMAL HIGH (ref 135–145)
TCO2: 23 mmol/L (ref 22–32)

## 2019-10-01 LAB — CBC
HCT: 53.5 % — ABNORMAL HIGH (ref 36.0–46.0)
Hemoglobin: 16 g/dL — ABNORMAL HIGH (ref 12.0–15.0)
MCH: 22.6 pg — ABNORMAL LOW (ref 26.0–34.0)
MCHC: 29.9 g/dL — ABNORMAL LOW (ref 30.0–36.0)
MCV: 75.7 fL — ABNORMAL LOW (ref 80.0–100.0)
Platelets: 475 10*3/uL — ABNORMAL HIGH (ref 150–400)
RBC: 7.07 MIL/uL — ABNORMAL HIGH (ref 3.87–5.11)
RDW: 16.6 % — ABNORMAL HIGH (ref 11.5–15.5)
WBC: 13.9 10*3/uL — ABNORMAL HIGH (ref 4.0–10.5)
nRBC: 0 % (ref 0.0–0.2)

## 2019-10-01 LAB — PROTIME-INR
INR: 1.1 (ref 0.8–1.2)
Prothrombin Time: 13.5 seconds (ref 11.4–15.2)

## 2019-10-01 LAB — CBG MONITORING, ED: Glucose-Capillary: 150 mg/dL — ABNORMAL HIGH (ref 70–99)

## 2019-10-01 LAB — SARS CORONAVIRUS 2 BY RT PCR (HOSPITAL ORDER, PERFORMED IN ~~LOC~~ HOSPITAL LAB): SARS Coronavirus 2: NEGATIVE

## 2019-10-01 LAB — APTT: aPTT: 27 seconds (ref 24–36)

## 2019-10-01 MED ORDER — SODIUM CHLORIDE 0.9 % IV SOLN: 1.0000 g | Freq: Every day | INTRAVENOUS | Status: DC

## 2019-10-01 MED ORDER — SODIUM CHLORIDE 0.9 % IV SOLN
1.0000 g | Freq: Once | INTRAVENOUS | Status: AC
  Administered 2019-10-01: 1 g via INTRAVENOUS
  Filled 2019-10-01: qty 10

## 2019-10-01 MED ORDER — ENOXAPARIN SODIUM 40 MG/0.4ML ~~LOC~~ SOLN
40.0000 mg | Freq: Every day | SUBCUTANEOUS | Status: DC
  Administered 2019-10-02 – 2019-10-06 (×6): 40 mg via SUBCUTANEOUS
  Filled 2019-10-01 (×6): qty 0.4

## 2019-10-01 MED ORDER — SODIUM CHLORIDE 0.9 % IV BOLUS
500.0000 mL | Freq: Once | INTRAVENOUS | Status: AC
  Administered 2019-10-01: 500 mL via INTRAVENOUS

## 2019-10-01 MED ORDER — SODIUM CHLORIDE 0.9 % IV SOLN
100.0000 mL/h | INTRAVENOUS | Status: DC
  Administered 2019-10-01 – 2019-10-02 (×2): 100 mL/h via INTRAVENOUS

## 2019-10-01 MED ORDER — SODIUM CHLORIDE 0.9 % IV BOLUS
1000.0000 mL | Freq: Once | INTRAVENOUS | Status: AC
  Administered 2019-10-01: 1000 mL via INTRAVENOUS
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
## 2019-10-01 NOTE — ED Notes (Addendum)
error 
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
## 2019-10-01 NOTE — ED Notes (Signed)
Pt transported to MRI 

## 2019-10-02 ENCOUNTER — Encounter (HOSPITAL_COMMUNITY): Payer: Self-pay | Admitting: Family Medicine

## 2019-10-02 ENCOUNTER — Inpatient Hospital Stay (HOSPITAL_COMMUNITY): Payer: Medicaid Other

## 2019-10-02 DIAGNOSIS — R4182 Altered mental status, unspecified: Secondary | ICD-10-CM

## 2019-10-02 DIAGNOSIS — N179 Acute kidney failure, unspecified: Secondary | ICD-10-CM | POA: Diagnosis present

## 2019-10-02 DIAGNOSIS — I639 Cerebral infarction, unspecified: Secondary | ICD-10-CM | POA: Diagnosis present

## 2019-10-02 DIAGNOSIS — G92 Toxic encephalopathy: Secondary | ICD-10-CM

## 2019-10-02 DIAGNOSIS — R7401 Elevation of levels of liver transaminase levels: Secondary | ICD-10-CM | POA: Diagnosis present

## 2019-10-02 LAB — BLOOD CULTURE ID PANEL (REFLEXED)

## 2019-10-02 LAB — AMMONIA: Ammonia: 24 umol/L (ref 9–35)

## 2019-10-02 LAB — CSF CELL COUNT WITH DIFFERENTIAL
Lymphs, CSF: 28 % — ABNORMAL LOW (ref 40–80)
Monocyte-Macrophage-Spinal Fluid: 6 % — ABNORMAL LOW (ref 15–45)
RBC Count, CSF: 375 /mm3 — ABNORMAL HIGH
RBC Count, CSF: 7830 /mm3 — ABNORMAL HIGH
Segmented Neutrophils-CSF: 66 % — ABNORMAL HIGH (ref 0–6)
Tube #: 1
Tube #: 4
WBC, CSF: 1 /mm3 (ref 0–5)
WBC, CSF: 23 /mm3 (ref 0–5)

## 2019-10-02 LAB — CBC
HCT: 53.6 % — ABNORMAL HIGH (ref 36.0–46.0)
Hemoglobin: 15.6 g/dL — ABNORMAL HIGH (ref 12.0–15.0)
MCH: 22.4 pg — ABNORMAL LOW (ref 26.0–34.0)
MCHC: 29.1 g/dL — ABNORMAL LOW (ref 30.0–36.0)
MCV: 77 fL — ABNORMAL LOW (ref 80.0–100.0)
Platelets: 409 10*3/uL — ABNORMAL HIGH (ref 150–400)
RBC: 6.96 MIL/uL — ABNORMAL HIGH (ref 3.87–5.11)
RDW: 16.6 % — ABNORMAL HIGH (ref 11.5–15.5)
WBC: 16.4 10*3/uL — ABNORMAL HIGH (ref 4.0–10.5)
nRBC: 0 % (ref 0.0–0.2)

## 2019-10-02 LAB — COMPREHENSIVE METABOLIC PANEL
ALT: 159 U/L — ABNORMAL HIGH (ref 0–44)
AST: 362 U/L — ABNORMAL HIGH (ref 15–41)
Albumin: 3 g/dL — ABNORMAL LOW (ref 3.5–5.0)
Alkaline Phosphatase: 67 U/L (ref 38–126)
Anion gap: 9 (ref 5–15)
BUN: 37 mg/dL — ABNORMAL HIGH (ref 8–23)
CO2: 19 mmol/L — ABNORMAL LOW (ref 22–32)
Calcium: 9.3 mg/dL (ref 8.9–10.3)
Chloride: 122 mmol/L — ABNORMAL HIGH (ref 98–111)
Creatinine, Ser: 0.93 mg/dL (ref 0.44–1.00)
GFR calc Af Amer: >60 mL/min (ref >60)
GFR calc non Af Amer: >60 mL/min (ref >60)
Glucose, Bld: 140 mg/dL — ABNORMAL HIGH (ref 70–99)
Potassium: 4.6 mmol/L (ref 3.5–5.1)
Sodium: 150 mmol/L — ABNORMAL HIGH (ref 135–145)
Total Bilirubin: 0.8 mg/dL (ref 0.3–1.2)
Total Protein: 7.3 g/dL (ref 6.5–8.1)

## 2019-10-02 LAB — HEMOGLOBIN A1C
Hgb A1c MFr Bld: 6 % — ABNORMAL HIGH (ref 4.8–5.6)
Mean Plasma Glucose: 125.5 mg/dL

## 2019-10-02 LAB — PROTEIN AND GLUCOSE, CSF
Glucose, CSF: 83 mg/dL — ABNORMAL HIGH (ref 40–70)
Total  Protein, CSF: 64 mg/dL — ABNORMAL HIGH (ref 15–45)

## 2019-10-02 LAB — LIPID PANEL
Cholesterol: 241 mg/dL — ABNORMAL HIGH (ref 0–200)
HDL: 41 mg/dL (ref >40)
LDL Cholesterol: 168 mg/dL — ABNORMAL HIGH (ref 0–99)
Total CHOL/HDL Ratio: 5.9 RATIO
Triglycerides: 158 mg/dL — ABNORMAL HIGH (ref <150)
VLDL: 32 mg/dL (ref 0–40)

## 2019-10-02 LAB — CK: Total CK: 24248 U/L — ABNORMAL HIGH (ref 38–234)

## 2019-10-02 LAB — TSH: TSH: 0.273 u[IU]/mL — ABNORMAL LOW (ref 0.350–4.500)

## 2019-10-02 LAB — LITHIUM LEVEL: Lithium Lvl: 0.13 mmol/L — ABNORMAL LOW (ref 0.60–1.20)

## 2019-10-02 LAB — VITAMIN B12: Vitamin B-12: 841 pg/mL (ref 180–914)

## 2019-10-02 MED ORDER — SODIUM CHLORIDE 0.9 % IV SOLN: INTRAVENOUS | Status: DC

## 2019-10-02 MED ORDER — LITHIUM CITRATE 300 MG/5 ML PO SYRP
200.0000 mg | Freq: Three times a day (TID) | ORAL | Status: DC
  Administered 2019-10-02 – 2019-10-07 (×14): 198 mg via ORAL
  Filled 2019-10-02 (×16): qty 3.3

## 2019-10-02 MED ORDER — SODIUM CHLORIDE 0.45 % IV SOLN: INTRAVENOUS | Status: DC

## 2019-10-02 MED ORDER — LITHIUM CARBONATE 300 MG PO CAPS: 300.0000 mg | ORAL_CAPSULE | Freq: Two times a day (BID) | ORAL | Status: DC

## 2019-10-02 MED ORDER — ASPIRIN 81 MG PO CHEW
324.0000 mg | CHEWABLE_TABLET | Freq: Every day | ORAL | Status: DC
  Administered 2019-10-02 – 2019-10-07 (×6): 324 mg via ORAL
  Filled 2019-10-02 (×7): qty 4

## 2019-10-02 MED ORDER — LITHIUM CARBONATE 150 MG PO CAPS: 150.0000 mg | ORAL_CAPSULE | Freq: Four times a day (QID) | ORAL | Status: DC

## 2019-10-02 MED ORDER — VANCOMYCIN HCL 1250 MG/250ML IV SOLN
1250.0000 mg | INTRAVENOUS | Status: AC
  Administered 2019-10-02: 1250 mg via INTRAVENOUS
  Filled 2019-10-02: qty 250

## 2019-10-02 MED ORDER — SODIUM CHLORIDE 0.9 % IV SOLN
1.0000 g | INTRAVENOUS | Status: AC
  Administered 2019-10-02: 1 g via INTRAVENOUS
  Filled 2019-10-02: qty 10

## 2019-10-02 MED ORDER — GADOBUTROL 1 MMOL/ML IV SOLN
7.5000 mL | Freq: Once | INTRAVENOUS | Status: AC | PRN
  Administered 2019-10-02: 7.5 mL via INTRAVENOUS

## 2019-10-02 MED ORDER — OLANZAPINE 10 MG PO TABS
20.0000 mg | ORAL_TABLET | Freq: Every day | ORAL | Status: DC
  Administered 2019-10-02 – 2019-10-06 (×5): 20 mg via ORAL
  Filled 2019-10-02: qty 8
  Filled 2019-10-02: qty 2
  Filled 2019-10-02: qty 8
  Filled 2019-10-02 (×5): qty 2
  Filled 2019-10-02 (×3): qty 8

## 2019-10-02 MED ORDER — VALBENAZINE TOSYLATE 80 MG PO CAPS: 1.0000 | ORAL_CAPSULE | Freq: Every day | ORAL | Status: DC

## 2019-10-02 MED ORDER — DEXTROSE 5 % IV SOLN
10.0000 mg/kg | Freq: Two times a day (BID) | INTRAVENOUS | Status: DC
  Administered 2019-10-02 – 2019-10-04 (×5): 680 mg via INTRAVENOUS
  Filled 2019-10-02 (×8): qty 13.6

## 2019-10-02 MED ORDER — VANCOMYCIN HCL IN DEXTROSE 1-5 GM/200ML-% IV SOLN
1000.0000 mg | INTRAVENOUS | Status: AC
  Administered 2019-10-03 – 2019-10-06 (×4): 1000 mg via INTRAVENOUS
  Filled 2019-10-02 (×4): qty 200

## 2019-10-02 MED ORDER — ATORVASTATIN CALCIUM 80 MG PO TABS
80.0000 mg | ORAL_TABLET | Freq: Every day | ORAL | Status: DC
  Administered 2019-10-02: 80 mg via ORAL
  Filled 2019-10-02: qty 1

## 2019-10-02 MED ORDER — SODIUM CHLORIDE 0.9 % IV SOLN
2.0000 g | INTRAVENOUS | Status: DC
  Administered 2019-10-02 – 2019-10-03 (×8): 2 g via INTRAVENOUS
  Filled 2019-10-02 (×2): qty 2000
  Filled 2019-10-02 (×2): qty 2
  Filled 2019-10-02 (×2): qty 2000
  Filled 2019-10-02 (×2): qty 2
  Filled 2019-10-02 (×2): qty 2000
  Filled 2019-10-02 (×3): qty 2

## 2019-10-02 MED ORDER — SODIUM CHLORIDE 0.9 % IV SOLN
2.0000 g | Freq: Two times a day (BID) | INTRAVENOUS | Status: DC
  Administered 2019-10-02 (×2): 2 g via INTRAVENOUS
  Filled 2019-10-02: qty 2
  Filled 2019-10-02 (×3): qty 20

## 2019-10-02 MED ORDER — LITHIUM CARBONATE ER 300 MG PO TBCR
300.0000 mg | EXTENDED_RELEASE_TABLET | Freq: Two times a day (BID) | ORAL | Status: DC
  Filled 2019-10-02: qty 1

## 2019-10-02 MED ORDER — OLANZAPINE 10 MG PO TABS
10.0000 mg | ORAL_TABLET | Freq: Every day | ORAL | Status: DC
  Administered 2019-10-03 – 2019-10-07 (×5): 10 mg via ORAL
  Filled 2019-10-02 (×3): qty 1
  Filled 2019-10-02 (×3): qty 4
  Filled 2019-10-02 (×2): qty 1

## 2019-10-02 NOTE — Evaluation (Signed)
Clinical/Bedside Swallow Evaluation Patient Details  Name: Karen Best MRN: [id number] Date of Birth: [birth year]  Today's Date: 10/02/2019 Time: SLP Start Time (ACUTE ONLY): 4174 SLP Stop Time (ACUTE ONLY): 1151 SLP Time Calculation (min) (ACUTE ONLY): 15 min  Past Medical History:  Past Medical History:  Diagnosis Date  . Asthma   . Bipolar 1 disorder ([place])   . Fibromyalgia   . Schizophrenia ([place])   . Tobacco abuse    Past Surgical History:  Past Surgical History:  Procedure Laterality Date  . CESAREAN SECTION    . TUBAL LIGATION     HPI:  Karen Best is a 64 y.o. female with medical history significant for medical history significant of bipolar 1 disorder, schizophrenia, asthma, fibromyalgia, dementia, and tobacco abuse who presents with altered mental status.  MRI was remarkable for a acute infarct within the posterior left subinsular white matter.    Assessment / Plan / Recommendation Clinical Impression  Pt was seen for a bedside swallow evaluation in the setting of AMS and she presents with oral dysphagia.  Pt was alert and agreeable to this evaluation.  She had difficulty following commands and therefore was unable to complete a majority of the oral mechanism examination.  Oral cavity was noted to be dry with dried flakes on the labial surfaces.  Pt was also edentulous.  Pt consumed trials of thin liquid (tsp/straw), puree, and ground solids.  She exhibited an immediate cough with the first sip of thin liquid via tsp; however, suspect that this was an isolated incident secondary to poor oral control with the bolus x1 rather than consistent oropharyngeal dysphagia.  She did not exhibit any additional s/sx of aspiration with thin liquid despite challenging with >6oz via straw sip.  Mastication of ground solids was prolonged and pt was noted to mostly utilize a lingual mashing pattern.  Oral residue on the pt's anterior lingual surface was also noted following the solid  trial.  Recommend initiation of Dysphagia 1 (puree) solids and thin liquids with medications administered crushed in puree.  Pt will likely require 1:1 assistance for all PO intake and to cue for compensatory strategies.  Pt was noted to be very impulsive with liquid intake despite verbal cues, and she would benefit from tactile cues to limit bolus size and to slow rate of intake.  SLP will f/u to monitor diet tolerance.    SLP Visit Diagnosis: Dysphagia, oral phase (R13.11)    Aspiration Risk  Mild aspiration risk    Diet Recommendation Thin liquid;Dysphagia 1 (Puree)   Liquid Administration via: Cup;Straw Medication Administration: Crushed with puree Supervision: Staff to assist with self feeding;Full supervision/cueing for compensatory strategies Compensations: Minimize environmental distractions;Slow rate;Small sips/bites Postural Changes: Seated upright at 90 degrees    Other  Recommendations Oral Care Recommendations: Oral care BID   Follow up Recommendations 24 hour supervision/assistance      Frequency and Duration min 2x/week  2 weeks       Prognosis Prognosis for Safe Diet Advancement: Fair Barriers to Reach Goals: Cognitive deficits      Swallow Study   General HPI: Karen Best is a 64 y.o. female with medical history significant for medical history significant of bipolar 1 disorder, schizophrenia, asthma, fibromyalgia, dementia, and tobacco abuse who presents with altered mental status.  MRI was remarkable for a acute infarct within the posterior left subinsular white matter.  Type of Study: Bedside Swallow Evaluation Previous Swallow Assessment: None Diet Prior to this Study: NPO  Temperature Spikes Noted: No Respiratory Status: Room air History of Recent Intubation: No Behavior/Cognition: Alert;Cooperative Oral Cavity Assessment: Dry;Dried secretions Oral Care Completed by SLP: No Oral Cavity - Dentition: Edentulous Self-Feeding Abilities: Needs  assist Patient Positioning: Upright in bed Baseline Vocal Quality: Normal Volitional Cough: Strong Volitional Swallow: Unable to elicit    Oral/Motor/Sensory Function Overall Oral Motor/Sensory Function: (Unable to evaluate - pt did not follow most commands)   Ice Chips Ice chips: Within functional limits Presentation: Spoon   Thin Liquid Thin Liquid: Impaired Presentation: Spoon;Straw Pharyngeal  Phase Impairments: Cough - Immediate    Nectar Thick Nectar Thick Liquid: Not tested   Honey Thick Honey Thick Liquid: Not tested   Puree Puree: Within functional limits Presentation: Spoon   Solid     Solid: Impaired Presentation: Spoon Oral Phase Impairments: Impaired mastication Oral Phase Functional Implications: Impaired mastication;Oral residue;Prolonged oral transit     Colin Mulders M.S., CCC-SLP Acute Rehabilitation Services Office: [phone]  [place] 10/02/2019,12:05 PM

## 2019-10-02 NOTE — Progress Notes (Signed)
PHARMACY - PHYSICIAN COMMUNICATION CRITICAL VALUE ALERT - BLOOD CULTURE IDENTIFICATION (BCID)  Karen Best is an 64 y.o. female  With possible meningitis  Assessment:  Possible meningitis. Blood cultures show 2/2 bottles with GPC and BCID showing MRSA  Name of physician (or Provider) Contacted:  X. Blount  Current antibiotics: vancomycin, ampicillin, ceftriaxone, acyclovir (HSV PCR pending)  Changes to prescribed antibiotics recommended:  -Continue current therapy and contact neurology in the morning  Results for orders placed or performed during the hospital encounter of 10/01/19  Blood Culture ID Panel (Reflexed) (Collected: 10/02/2019  1:15 AM)  Result Value Ref Range   Enterococcus species NOT DETECTED NOT DETECTED   Listeria monocytogenes NOT DETECTED NOT DETECTED   Staphylococcus species DETECTED (A) NOT DETECTED   Staphylococcus aureus (BCID) NOT DETECTED NOT DETECTED   Methicillin resistance DETECTED (A) NOT DETECTED   Streptococcus species NOT DETECTED NOT DETECTED   Streptococcus agalactiae NOT DETECTED NOT DETECTED   Streptococcus pneumoniae NOT DETECTED NOT DETECTED   Streptococcus pyogenes NOT DETECTED NOT DETECTED   Acinetobacter baumannii NOT DETECTED NOT DETECTED   Enterobacteriaceae species NOT DETECTED NOT DETECTED   Enterobacter cloacae complex NOT DETECTED NOT DETECTED   Escherichia coli NOT DETECTED NOT DETECTED   Klebsiella oxytoca NOT DETECTED NOT DETECTED   Klebsiella pneumoniae NOT DETECTED NOT DETECTED   Proteus species NOT DETECTED NOT DETECTED   Serratia marcescens NOT DETECTED NOT DETECTED   Haemophilus influenzae NOT DETECTED NOT DETECTED   Neisseria meningitidis NOT DETECTED NOT DETECTED   Pseudomonas aeruginosa NOT DETECTED NOT DETECTED   Candida albicans NOT DETECTED NOT DETECTED   Candida glabrata NOT DETECTED NOT DETECTED   Candida krusei NOT DETECTED NOT DETECTED   Candida parapsilosis NOT DETECTED NOT DETECTED   Candida tropicalis NOT  DETECTED NOT DETECTED    Hildred Laser, PharmD Clinical Pharmacist **Pharmacist phone directory can now be found on [email or web address] ([email or web address]).  Listed under [place].

## 2019-10-02 NOTE — Progress Notes (Signed)
Pharmacy Antibiotic Note  Karen Best is a 64 y.o. female admitted on 10/01/2019 with concern for meningitis.  Pharmacy has been consulted for Vancomycin and Acyclovir dosing. Pt also receiving Ampicillin and Ceftriaxone.  Plan: Acyclovir 680 mg IV q12h Vancomycin 1250mg  IV now then 1000 mg IV Q 24 hrs.  Will f/u renal function, micro data, and pt's clinical condition Vanc trough prn     Temp (24hrs), Avg:98.3 F (36.8 C), Min:98.3 F (36.8 C), Max:98.3 F (36.8 C)  Recent Labs  Lab 10/01/19 1729 10/01/19 1801  WBC 13.9*  --   CREATININE 1.18* 1.30*    Estimated Creatinine Clearance: 40.4 mL/min (A) (by C-G formula based on SCr of 1.3 mg/dL (H)).    No Known Allergies  Antimicrobials this admission: 6/5 Ceftriaxone >>  6/6 Ampicilline >>  6/6 Vancomycin >> 6/6 Acyclovir >>  Microbiology results: Pending  Thank you for allowing pharmacy to be a part of this patient's care.  Sherlon Handing, PharmD, BCPS Please see amion for complete clinical pharmacist phone list 10/02/2019 12:46 AM

## 2019-10-02 NOTE — Progress Notes (Signed)
[name] Other   [phone]  [name] [phone]  [phone]  [name],[name] Friend [phone]     Per request by Unit Pharmacist, I attempted to contact all contacts on patient's list to obtain history of medications.  There was no answer for any of the contacts listed.

## 2019-10-02 NOTE — ED Notes (Signed)
Blood cultures ordered after ABX started
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
## 2019-10-02 NOTE — Evaluation (Signed)
Physical Therapy Evaluation Patient Details Name: Karen Best MRN: [id number] DOB: 1955/12/09 Today's Date: 10/02/2019   History of Present Illness  Pt is a 64 y/o female admitted secondary to toxic metabolic encephalopathy. Also found to have L subinsular white matter infarct. PMH includes schizophrenia, bipolar disorder, CVA.   Clinical Impression  Pt admitted secondary to problem above with deficits below. Pt requiring total A to roll from side to side for clean up. Pt not able to assist and not following commands. Pt responding "yes" to every question asked, no matter the question. Unsure of pt's baseline as no family present. Feel pt will require SNF level therapies at d/c to increase independence and safety.     Follow Up Recommendations SNF;Supervision/Assistance - 24 hour    Equipment Recommendations  Other (comment)(TBD)    Recommendations for Other Services       Precautions / Restrictions Precautions Precautions: Fall Restrictions Weight Bearing Restrictions: No      Mobility  Bed Mobility Overal bed mobility: Needs Assistance Bed Mobility: Rolling Rolling: Total assist         General bed mobility comments: total A for clean up and to change linens in bed. Pt unable to follow commands to assist with movement. Further mobility deferred.   Transfers                    Ambulation/Gait                Stairs            Wheelchair Mobility    Modified Rankin (Stroke Patients Only)       Balance                                             Pertinent Vitals/Pain Pain Assessment: Faces Faces Pain Scale: No hurt    Home Living Family/patient expects to be discharged to:: Unsure                 Additional Comments: Pt unable to answer any questions. Only responding "yes" to every question asked about home environment.     Prior Function           Comments: Unsure of PLOF as pt only answering "yes"  to all questions.      Hand Dominance        Extremity/Trunk Assessment   Upper Extremity Assessment Upper Extremity Assessment: Defer to OT evaluation    Lower Extremity Assessment Lower Extremity Assessment: Difficult to assess due to impaired cognition;Generalized weakness       Communication   Communication: Expressive difficulties  Cognition Arousal/Alertness: Awake/alert Behavior During Therapy: Flat affect Overall Cognitive Status: No family/caregiver present to determine baseline cognitive functioning                                 General Comments: Pt unable to state name or answer any orientation questions. Pt not following commands to assist with mobility.       General Comments General comments (skin integrity, edema, etc.): No family present     Exercises     Assessment/Plan    PT Assessment Patient needs continued PT services  PT Problem List Decreased strength;Decreased balance;Decreased mobility;Decreased cognition;Decreased knowledge of use of DME;Decreased safety awareness;Decreased knowledge of precautions  PT Treatment Interventions DME instruction;Gait training;Functional mobility training;Therapeutic exercise;Therapeutic activities;Neuromuscular re-education;Balance training;Cognitive remediation;Patient/family education    PT Goals (Current goals can be found in the Care Plan section)  Acute Rehab PT Goals PT Goal Formulation: Patient unable to participate in goal setting Time For Goal Achievement: 10/16/19 Potential to Achieve Goals: Fair    Frequency Min 2X/week   Barriers to discharge Other (comment) Unsure of home environment or caregiver support     Co-evaluation               AM-PAC PT "6 Clicks" Mobility  Outcome Measure Help needed turning from your back to your side while in a flat bed without using bedrails?: Total Help needed moving from lying on your back to sitting on the side of a flat bed  without using bedrails?: Total Help needed moving to and from a bed to a chair (including a wheelchair)?: Total Help needed standing up from a chair using your arms (e.g., wheelchair or bedside chair)?: Total Help needed to walk in hospital room?: Total Help needed climbing 3-5 steps with a railing? : Total 6 Click Score: 6    End of Session   Activity Tolerance: Patient tolerated treatment well Patient left: in bed;with call bell/phone within reach;with bed alarm set;with nursing/sitter in room Nurse Communication: Mobility status PT Visit Diagnosis: Unsteadiness on feet (R26.81);Muscle weakness (generalized) (M62.81);Difficulty in walking, not elsewhere classified (R26.2)    Time: 6803-2122 PT Time Calculation (min) (ACUTE ONLY): 17 min   Charges:   PT Evaluation $PT Eval Moderate Complexity: 1 Mod          Reuel Derby, PT, DPT  Acute Rehabilitation Services  Pager: [phone] Office: [phone]   Rudean Hitt 10/02/2019, 1:37 PM

## 2019-10-02 NOTE — ED Provider Notes (Signed)
I was asked by Neuro and TRH for assistance with LP on this patient with AMS/somnolence to r/o meningitis. Consent obtained by TRH. LP performed in the ED by me.  .Lumbar Puncture  Date/Time: 10/02/2019 2:43 AM Performed by: Fatima Blank, MD Authorized by: Fatima Blank, MD   Consent:    Consent obtained:  Verbal   Consent given by:  Healthcare agent   Risks discussed:  Headache, nerve damage, repeat procedure, pain, infection and bleeding   Alternatives discussed:  Delayed treatment Pre-procedure details:    Procedure purpose:  Diagnostic   Preparation: Patient was prepped and draped in usual sterile fashion   Anesthesia (see MAR for exact dosages):    Anesthesia method:  Local infiltration   Local anesthetic:  Lidocaine 1% w/o epi Procedure details:    Lumbar space:  L3-L4 interspace   Patient position:  R lateral decubitus   Needle gauge:  18   Needle type:  Spinal needle - Quincke tip   Needle length (in):  3.5   Ultrasound guidance: no     Number of attempts:  1   Fluid appearance:  Blood-tinged then clearing   Tubes of fluid:  4   Total volume (ml):  10 Post-procedure:    Puncture site:  Adhesive bandage applied   Patient tolerance of procedure:  Tolerated well, no immediate complications      Raechal Raben, Grayce Sessions, MD 10/02/19 [phone]

## 2019-10-02 NOTE — Progress Notes (Signed)
EEG complete - results pending 

## 2019-10-02 NOTE — Progress Notes (Signed)
Patient admitted into room 3W37 from ED. She responds to stimulation and opens her eyes, not following many commands. Multiple bruises and blisters noted on skin assessment. Vitals are stable at this time. NIH of 23.   Patient placed on the monitor and bathed. Will continue to monitor closely.

## 2019-10-02 NOTE — Plan of Care (Signed)
  Bloody tap with 7830 RBC and 23WBC in Tube 1. 1 WBC with 375 RBC in tube 4. Does not look convincing for bacterial meningitis. Protein elevated to 64 but with one WBC in the non bloody tube, it is less likely to be viral as well. Will await viral and bacterial studies. Continue abx/antivirals for now.  Also order routine EEG Continue stroke w/u  -- Amie Portland, MD [facility] Pager: [phone] If 7pm to 7am, please call on call as listed on AMION.

## 2019-10-02 NOTE — Procedures (Signed)
Patient Name: Karen Best  MRN: [id number]  Epilepsy Attending: Lora Havens  Referring Physician/Provider: Dr Amie Portland Date: 09/01/2019 Duration: 25.47 mins  Patient history: 63yo F with ams. EEG to evaluate for seizure.  Level of alertness:  lethargic  AEDs during EEG study: None  Technical aspects: This EEG study was done with scalp electrodes positioned according to the 10-20 International system of electrode placement. Electrical activity was acquired at a sampling rate of 500Hz  and reviewed with a high frequency filter of 70Hz  and a low frequency filter of 1Hz . EEG data were recorded continuously and digitally stored.   Description: No clear posterior dominant rhythm was seen. EEG showed continuos generalized 3 to 6 Hz theta-delta slowing. Hyperventilation and photic stimulation were not performed.     ABNORMALITY -Continuous slow, generalized  IMPRESSION: This study is suggestive of moderate diffuse encephalopathy, nonspecific etiology. No seizures or epileptiform discharges were seen throughout the recording.  Karen Best Karen Best

## 2019-10-02 NOTE — Progress Notes (Addendum)
Progress Note    Karen Best  KDX:[id number] DOB: [birth year]  DOA: 10/01/2019 PCP: [facility]      Brief Narrative:    Medical records reviewed and are as summarized below:  Karen Best is a 64 y.o. female  with medical history significant for medical history significant ofbipolar 1 disorder, schizophrenia, asthma, fibromyalgia, dementia, and polysubstance abuse (including cocaine, tobacco) who presented to the hospital with altered mental status/unresponsiveness.   Work-up revealed acute ischemic infarct in the posterior left subinsular white matter, UTI, AKI and rhabdomyolysis.      Assessment/Plan:   Principal Problem:   AMS (altered mental status) Active Problems:   Schizophrenia (HCC)   UTI (urinary tract infection)   Bipolar 1 disorder (HCC)   AKI (acute kidney injury) ([place])   Transaminitis   CVA (cerebral vascular accident) ([place])   Acute ischemic stroke: MRI brain showed 6 mm acute ischemic infarct in the posterior left subinsular white matter.  MRV was unremarkable.  CSF analysis not convincing for meningitis.  EEG report pending.  2D echo is pending.  PT and OT as able.  Continue aspirin and Lipitor.  Neurologist recommended continuing empiric IV antibiotics to cover for any possible meningitis pending further results.  Dysphagia: Speech therapist recommended dysphagia 1 diet (pured diet) and thin liquids  Acute lower UTI, leukocytosis: Continue empiric IV antibiotics.  Follow-up urine and blood cultures.  Rhabdomyolysis: CK level on admission was 24,248. Continue IV fluids and monitor CK level.  AKI: Improved  Hypernatremia: Continue IV fluids (changed from normal saline to half-normal saline)  Prediabetes: Hemoglobin A1c 6  Hyperlipidemia: Continue Lipitor  Bipolar disorder, schizophrenia: Continue psychotropics including lithium..  Lithium level is 0.13 which is subtherapeutic.  Body mass index is 24.76  kg/m.   Family Communication/Anticipated D/C date and plan/Code Status   DVT prophylaxis: Lovenox Code Status: Full code Family Communication: None Disposition Plan:    Status is: Inpatient  Remains inpatient appropriate because:Unsafe d/c plan and Inpatient level of care appropriate due to severity of illness   Dispo: The patient is from: Home              Anticipated d/c is to: SNF              Anticipated d/c date is: > 3 days              Patient currently is not medically stable to d/c.           Subjective:   She is unable to provide any history because of confusion and lethargy  Objective:    Vitals:   10/02/19 0531 10/02/19 0631 10/02/19 0731 10/02/19 0815  BP: (!) 150/88 129/87 (!) 147/91   Pulse: 94 88 89 90  Resp: 14 14 18 19   Temp:   98.8 F (37.1 C)   TempSrc:   Oral   SpO2: 99% 99% 99% 99%  Weight:      Height:       No data found.   Intake/Output Summary (Last 24 hours) at 10/02/2019 1204 Last data filed at 10/02/2019 0500 Gross per 24 hour  Intake 926.26 ml  Output --  Net 926.26 ml   Filed Weights   10/02/19 0316  Weight: 67.5 kg    Exam:  GEN: NAD SKIN: Blister on the right index finger (lateral aspect) EYES: EOMI. PERRLA ENT: MMM CV: RRR PULM: CTA B ABD: soft, ND, +BS CNS: Drowsy but arousable.  Speech is slurred and barely audible.  Flaccid right-sided weakness. EXT: No edema   Data Reviewed:   I have personally reviewed following labs and imaging studies:  Labs: Labs show the following:   Basic Metabolic Panel: Recent Labs  Lab 10/01/19 1729 10/01/19 1729 10/01/19 1801 10/02/19 0518  NA 145  --  148* 150*  K 5.2*   < > 4.9 4.6  CL 116*  --  117* 122*  CO2 18*  --   --  19*  GLUCOSE 142*  --  142* 140*  BUN 52*  --  61* 37*  CREATININE 1.18*  --  1.30* 0.93  CALCIUM 9.5  --   --  9.3   < > = values in this interval not displayed.   GFR Estimated Creatinine Clearance: 56.4 mL/min (by C-G formula  based on SCr of 0.93 mg/dL). Liver Function Tests: Recent Labs  Lab 10/01/19 1729 10/02/19 0518  AST 379* 362*  ALT 153* 159*  ALKPHOS 71 67  BILITOT 0.8 0.8  PROT 8.8* 7.3  ALBUMIN 3.7 3.0*   No results for input(s): LIPASE, AMYLASE in the last 168 hours. Recent Labs  Lab 10/02/19 0055  AMMONIA 24   Coagulation profile Recent Labs  Lab 10/01/19 1729  INR 1.1    CBC: Recent Labs  Lab 10/01/19 1729 10/01/19 1801 10/02/19 0518  WBC 13.9*  --  16.4*  NEUTROABS 11.6*  --   --   HGB 16.0* 17.0* 15.6*  HCT 53.5* 50.0* 53.6*  MCV 75.7*  --  77.0*  PLT 475*  --  409*   Cardiac Enzymes: Recent Labs  Lab 10/01/19 2234  CKTOTAL 24,248*   BNP (last 3 results) No results for input(s): PROBNP in the last 8760 hours. CBG: Recent Labs  Lab 10/01/19 1714  GLUCAP 150*   D-Dimer: No results for input(s): DDIMER in the last 72 hours. Hgb A1c: Recent Labs    10/02/19 0518  HGBA1C 6.0*   Lipid Profile: Recent Labs    10/02/19 0518  CHOL 241*  HDL 41  LDLCALC 168*  TRIG 158*  CHOLHDL 5.9   Thyroid function studies: Recent Labs    10/02/19 0118  TSH 0.273*   Anemia work up: Recent Labs    10/02/19 0117  VITAMINB12 841   Sepsis Labs: Recent Labs  Lab 10/01/19 1729 10/02/19 0518  WBC 13.9* 16.4*    Microbiology Recent Results (from the past 240 hour(s))  SARS Coronavirus 2 by RT PCR (hospital order, performed in [facility] hospital lab) Nasopharyngeal Nasopharyngeal Swab     Status: None   Collection Time: 10/01/19  5:51 PM   Specimen: Nasopharyngeal Swab  Result Value Ref Range Status   SARS Coronavirus 2 NEGATIVE NEGATIVE Final    Comment: (NOTE) SARS-CoV-2 target nucleic acids are NOT DETECTED. The SARS-CoV-2 RNA is generally detectable in upper and lower respiratory specimens during the acute phase of infection. The lowest concentration of SARS-CoV-2 viral copies this assay can detect is 250 copies / mL. A negative result does not  preclude SARS-CoV-2 infection and should not be used as the sole basis for treatment or other patient management decisions.  A negative result may occur with improper specimen collection / handling, submission of specimen other than nasopharyngeal swab, presence of viral mutation(s) within the areas targeted by this assay, and inadequate number of viral copies (<250 copies / mL). A negative result must be combined with clinical observations, patient history, and epidemiological information. Fact Sheet for Patients:  StrictlyIdeas.no Fact Sheet for Healthcare Providers: BankingDealers.co.za This test is not yet approved or cleared  by the [place] FDA and has been authorized for detection and/or diagnosis of SARS-CoV-2 by FDA under an Emergency Use Authorization (EUA).  This EUA will remain in effect (meaning this test can be used) for the duration of the COVID-19 declaration under Section 564(b)(1) of the Act, 21 U.S.C. section 360bbb-3(b)(1), unless the authorization is terminated or revoked sooner. Performed at [facility], [place] [address] [place], [place] [address]   CSF culture     Status: None (Preliminary result)   Collection Time: 10/02/19  2:46 AM   Specimen: CSF; Cerebrospinal Fluid  Result Value Ref Range Status   Specimen Description CSF  Final   Special Requests NONE  Final   Gram Stain   Final    CYTOSPIN SMEAR WBC PRESENT,BOTH PMN AND MONONUCLEAR NO ORGANISMS SEEN Performed at [facility], [place] [address], [place], [place] [address]    Culture PENDING  Incomplete   Report Status PENDING  Incomplete  Culture, fungus without smear     Status: None (Preliminary result)   Collection Time: 10/02/19  2:47 AM   Specimen: CSF; Cerebrospinal Fluid  Result Value Ref Range Status   Specimen Description CSF  Final   Special Requests NONE  Final   Culture   Final    NO GROWTH < 12 HOURS Performed at [facility], [place] [address], [place], [place] [address]    Report Status PENDING  Incomplete    Procedures and diagnostic studies:  DG Abd 1 View  Result Date: 10/01/2019 CLINICAL DATA:  MRI screening EXAM: ABDOMEN - 1 VIEW COMPARISON:  07/22/2015 FINDINGS: Normal bowel gas pattern. Numerous EKG leads project over abdomen and pelvis. No additional radiopaque foreign bodies identified. Osseous structures unremarkable. IMPRESSION: Numerous EKG leads project over abdomen and pelvis. Electronically Signed   By: Lavonia Dana M.D.   On: 10/01/2019 19:27   CT HEAD WO CONTRAST  Result Date: 10/01/2019 CLINICAL DATA:  Ataxia, stroke suspected. EXAM: CT HEAD WITHOUT CONTRAST TECHNIQUE: Contiguous axial images were obtained from the base of the skull through the vertex without intravenous contrast. COMPARISON:  Head CT 09/20/2019. FINDINGS: Brain: Stable, mild generalized parenchymal atrophy. Mild ill-defined hypoattenuation within the cerebral white matter is nonspecific, but consistent with chronic small vessel ischemic disease. There is no acute intracranial hemorrhage. No demarcated cortical infarct. No extra-axial fluid collection. No evidence of intracranial mass. No midline shift. Vascular: No hyperdense vessel. Skull: Normal. Negative for fracture or focal lesion. Sinuses/Orbits: Visualized orbits show no acute finding. No significant paranasal sinus disease at the imaged levels. Right mastoid effusion. IMPRESSION: No CT evidence of acute intracranial abnormality. Stable generalized parenchymal atrophy and chronic small vessel ischemic disease. Chronic right mastoid effusion. Electronically Signed   By: Kellie Simmering DO   On: 10/01/2019 18:30   MR BRAIN WO CONTRAST  Addendum Date: 10/02/2019   ADDENDUM REPORT: 10/02/2019 00:01 ADDENDUM: These results were called by telephone at the time of interpretation on 10/02/2019 at 11:00 AM to provider Dr. Flossie Buffy , who verbally acknowledged these results. Electronically  Signed   By: Kellie Simmering DO   On: 10/02/2019 00:01   Result Date: 10/02/2019 CLINICAL DATA:  Focal neuro deficit, greater than 6 hours, stroke suspected. Additional history provided collision EXAM: MRI HEAD WITHOUT CONTRAST TECHNIQUE: Multiplanar, multiecho pulse sequences of the brain and surrounding structures were obtained without intravenous contrast. COMPARISON:  Prior head CT examinations 10/01/2019 and earlier, brain MRI 12/25/2018 FINDINGS: Brain: There is a 6 mm focus of restricted diffusion within the posterior left subinsular white matter consistent with acute infarction (series 5, image 73). No acute infarct is identified elsewhere within the brain. There is mild scattered T2/FLAIR hyperintensity within the cerebral white matter which is nonspecific, but consistent with chronic small vessel ischemic disease. These findings are similar as compared to prior MRI 12/25/2018. Redemonstrated small chronic lacunar infarct within the right lentiform nucleus. No evidence of intracranial mass. No chronic intracranial blood products. No extra-axial fluid collection. No midline shift. Vascular: There is FLAIR hyperintensity within the right transverse and sigmoid dural venous sinuses extending into the right jugular bulb and upper right jugular vein. Expected proximal arterial flow voids. Skull and upper cervical spine: No focal marrow lesion. Sinuses/Orbits: Visualized orbits show no acute finding. Trace right sphenoid sinus mucosal thickening. Chronic right mastoid effusion. Multiple bilateral adenoid cysts are redemonstrated. IMPRESSION: 6 mm acute infarct within the posterior left subinsular white matter. Background mild chronic small vessel ischemic changes within the cerebral white matter. Redemonstrated small chronic right basal ganglia lacunar infarct. There is FLAIR hyperintensity within the right transverse and sigmoid dural venous sinuses extending into the right jugular bulb and upper right jugular  vein. This may be due to slow flow. However, contrast-enhanced CT or MR venography is recommended to exclude dural venous sinus thrombosis. Chronic right mastoid effusion with evidence of right-sided eustachian tube dysfunction in the setting of multiple bilateral adenoids cysts. These findings were also present on prior MRI 12/25/2018. ENT follow-up is recommended. Electronically Signed: By: Kellie Simmering DO On: 10/01/2019 22:56   DG Chest Port 1 View  Result Date: 10/01/2019 CLINICAL DATA:  Stroke-like symptoms, right-sided facial droop, history of cocaine abuse EXAM: PORTABLE CHEST 1 VIEW COMPARISON:  07/18/2019, 09/05/2018 FINDINGS: Single frontal view of the chest demonstrates an unremarkable cardiac silhouette. No airspace disease, effusion, or pneumothorax. Chronic background scarring. No acute bony abnormalities. Chronic punctate radiodensities within the left shoulder likely shrapnel. IMPRESSION: 1. No acute intrathoracic process. Electronically Signed   By: Randa Ngo M.D.   On: 10/01/2019 20:28   MR MRV HEAD W WO CONTRAST  Result Date: 10/02/2019 CLINICAL DATA:  Initial evaluation for acute encephalopathy, possible venous sinus thrombosis on previous brain MRI. EXAM: MR VENOGRAM HEAD WITHOUT AND WITH CONTRAST TECHNIQUE: Angiographic images of the intracranial venous structures were obtained using MRV technique without and with intravenous contrast. CONTRAST:  7.68mL GADAVIST GADOBUTROL 1 MMOL/ML IV SOLN COMPARISON:  Prior brain MRI from 10/01/2019. FINDINGS: Normal flow related signal seen throughout the superior sagittal sinus to the level of the torcula. Torcula itself is patent. Normal signal seen throughout the transverse and sigmoid sinuses bilaterally as well as the visualized proximal internal jugular veins. Probable small diverticulum noted at the left jugular bulb (series 5, image 73). This protrudes inferiorly and laterally, and is unlikely to be of clinical significance. Straight sinus,  vein of Galen, internal cerebral veins, and basal veins of Rosenthal appear patent. No evidence for dural sinus thrombosis. No appreciable dural sinus stenosis. IMPRESSION: Negative intracranial MRV. With no evidence for dural sinus thrombosis identified. Previously noted FLAIR signal abnormality involving the right transverse and sigmoid sinuses most consistent with slow flow. Electronically Signed   By: Jeannine Boga M.D.   On: 10/02/2019 00:38    Medications:   . aspirin  324 mg Oral Daily  . atorvastatin  80 mg Oral q1800  .  enoxaparin (LOVENOX) injection  40 mg Subcutaneous QHS   Continuous Infusions: . sodium chloride 125 mL/hr at 10/02/19 0842  . acyclovir 114 mL/hr at 10/02/19 0458  . ampicillin (OMNIPEN) IV 2 g (10/02/19 1035)  . cefTRIAXone (ROCEPHIN)  IV 2 g (10/02/19 1055)  . [START ON 10/03/2019] vancomycin       LOS: 1 day   Dianne Whelchel  [facility]     10/02/2019, 12:04 PM

## 2019-10-03 ENCOUNTER — Other Ambulatory Visit (HOSPITAL_COMMUNITY): Payer: Medicaid Other

## 2019-10-03 DIAGNOSIS — I639 Cerebral infarction, unspecified: Secondary | ICD-10-CM

## 2019-10-03 DIAGNOSIS — E860 Dehydration: Secondary | ICD-10-CM | POA: Diagnosis present

## 2019-10-03 DIAGNOSIS — B9561 Methicillin susceptible Staphylococcus aureus infection as the cause of diseases classified elsewhere: Secondary | ICD-10-CM | POA: Diagnosis present

## 2019-10-03 DIAGNOSIS — R7881 Bacteremia: Secondary | ICD-10-CM | POA: Diagnosis present

## 2019-10-03 DIAGNOSIS — E870 Hyperosmolality and hypernatremia: Secondary | ICD-10-CM | POA: Diagnosis present

## 2019-10-03 LAB — HSV DNA BY PCR (REFERENCE LAB)
HSV 1 DNA: NEGATIVE
HSV 2 DNA: NEGATIVE

## 2019-10-03 LAB — T4, FREE: Free T4: 1.05 ng/dL (ref 0.61–1.12)

## 2019-10-03 LAB — CK: Total CK: 5699 U/L — ABNORMAL HIGH (ref 38–234)

## 2019-10-03 LAB — PATHOLOGIST SMEAR REVIEW

## 2019-10-03 MED ORDER — SODIUM CHLORIDE 0.9 % IV SOLN
1.0000 g | Freq: Once | INTRAVENOUS | Status: AC
  Administered 2019-10-03: 1 g via INTRAVENOUS
  Filled 2019-10-03: qty 10

## 2019-10-03 MED ORDER — DEXTROSE-NACL 5-0.45 % IV SOLN: INTRAVENOUS | Status: DC

## 2019-10-03 NOTE — Evaluation (Signed)
Occupational Therapy Evaluation Patient Details Name: Karen Best MRN: [id number] DOB: 01-Oct-1955 Today's Date: 10/03/2019    History of Present Illness Pt is a 65 y/o female admitted secondary to toxic metabolic encephalopathy. Also found to have L subinsular white matter infarct. PMH includes schizophrenia, bipolar disorder, CVA.    Clinical Impression   PTA patient reports independent and living alone. Admitted for above and limited by problem list below, including generalized weakness, R UE edema/pain, decreased activity tolerance. Patient evaluation limited to bed level due to lethargy.  Patient oriented to self, place/time with choice, requires increased time to follow simple 1 step commands; patient requires multimodal cueing to maintain alertness during session.  She is able to wash face with cueing using L hand and total assist to wash hands, requires total assist for all other self care tasks.  Patient will benefit from further OT services while admitted and after dc at SNF level to optimize independence and safety with ADLs.     Follow Up Recommendations  SNF;Supervision/Assistance - 24 hour    Equipment Recommendations  Other (comment)(TBD at next venue of care )    Recommendations for Other Services       Precautions / Restrictions Precautions Precautions: Fall Restrictions Weight Bearing Restrictions: No      Mobility Bed Mobility               General bed mobility comments: deferred due to lethargy   Transfers                 General transfer comment: deferred due to lethargy     Balance                                           ADL either performed or assessed with clinical judgement   ADL Overall ADL's : Needs assistance/impaired     Grooming: Maximal assistance;Bed level   Upper Body Bathing: Total assistance;Bed level   Lower Body Bathing: Total assistance;Bed level   Upper Body Dressing : Total assistance;Bed  level   Lower Body Dressing: Total assistance;Bed level     Toilet Transfer Details (indicate cue type and reason): defered due to lethargy           General ADL Comments: pt requires total assist for all self care at this time, max assist to wash face/hand's; limited by lethargy, cognition, R UE edema/pain, activity tolerance     Vision         Perception     Praxis      Pertinent Vitals/Pain Pain Assessment: Faces Faces Pain Scale: Hurts even more Pain Location: R UE with movement (infiltrated)  Pain Descriptors / Indicators: Discomfort;Grimacing Pain Intervention(s): Limited activity within patient's tolerance;Monitored during session;Repositioned     Hand Dominance Right   Extremity/Trunk Assessment Upper Extremity Assessment Upper Extremity Assessment: RUE deficits/detail;LUE deficits/detail;Difficult to assess due to impaired cognition RUE Deficits / Details: able to grasp 3/5 MMT, edema and RN reports UE infiltrated; elevated UE on pillows to support edema  RUE Coordination: decreased gross motor;decreased fine motor LUE Deficits / Details: grossly 3-/5 MMT proximally and 3/5 MMT grasp; limited initation in MMT but able to functionally wash face LUE Coordination: decreased fine motor;decreased gross motor   Lower Extremity Assessment Lower Extremity Assessment: Defer to PT evaluation       Communication Communication Communication: Expressive difficulties   Cognition  Arousal/Alertness: Lethargic Behavior During Therapy: Flat affect Overall Cognitive Status: No family/caregiver present to determine baseline cognitive functioning                                 General Comments: patient oriented to self, place/time with choices; follows simple commands with increased time but fatigued and requires tactile cueing to maintain alertness    General Comments  no family present    Exercises     Shoulder Instructions      Home Living  Family/patient expects to be discharged to:: Private residence Living Arrangements: Alone   Type of Home: Apartment Home Access: Level entry     Home Layout: One level     Bathroom Shower/Tub: Occupational psychologist: Standard     Home Equipment: None   Additional Comments: limited responses, lethargic and falling asleep       Prior Functioning/Environment Level of Independence: Independent        Comments: reports independent with ADLs, not driving         OT Problem List: Decreased strength;Decreased range of motion;Decreased activity tolerance;Decreased cognition;Decreased safety awareness;Decreased knowledge of use of DME or AE;Decreased knowledge of precautions;Impaired UE functional use;Pain;Increased edema      OT Treatment/Interventions: Self-care/ADL training;DME and/or AE instruction;Therapeutic activities;Cognitive remediation/compensation;Patient/family education;Balance training;Therapeutic exercise    OT Goals(Current goals can be found in the care plan section) Acute Rehab OT Goals Patient Stated Goal: less pain in my arm OT Goal Formulation: With patient Time For Goal Achievement: 10/17/19 Potential to Achieve Goals: Fair  OT Frequency: Min 2X/week   Barriers to D/C:            Co-evaluation              AM-PAC OT "6 Clicks" Daily Activity     Outcome Measure Help from another person eating meals?: Total Help from another person taking care of personal grooming?: A Lot Help from another person toileting, which includes using toliet, bedpan, or urinal?: Total Help from another person bathing (including washing, rinsing, drying)?: Total Help from another person to put on and taking off regular upper body clothing?: Total Help from another person to put on and taking off regular lower body clothing?: Total 6 Click Score: 7   End of Session Nurse Communication: Mobility status  Activity Tolerance: Patient limited by  lethargy Patient left: in bed;with call bell/phone within reach;with bed alarm set;with nursing/sitter in room  OT Visit Diagnosis: Other abnormalities of gait and mobility (R26.89);Muscle weakness (generalized) (M62.81);Other symptoms and signs involving cognitive function                Time: 5885-0277 OT Time Calculation (min): 16 min Charges:  OT General Charges $OT Visit: 1 Visit OT Evaluation $OT Eval Moderate Complexity: 1 Mod  Jolaine Artist, OT Acute Rehabilitation Services Pager [phone] Office [phone]    Delight Stare 10/03/2019, 3:13 PM
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
## 2019-10-03 NOTE — TOC CAGE-AID Note (Signed)
Transition of Care [facility]) - CAGE-AID Screening   Patient Details  Name: Karen Best MRN: [id number] Date of Birth: [birth year]  Transition of Care [facility]) CM/SW Contact:    Emeterio Reeve, [place] Phone Number: 10/03/2019, 4:41 PM   Clinical Narrative:  Pt declined alcohol use and substance use. CSW informed pt she as positive for cocaine upon admission. Pt stated "that was a one time thing". Pt denied education and resources. Pt stated repeatedly, "I dont have a problem, I dont need anything from you".  CAGE-AID Screening:    Have You Ever Felt You Ought to Cut Down on Your Drinking or Drug Use?: No Have People Annoyed You By Critizing Your Drinking Or Drug Use?: Yes Have You Felt Bad Or Guilty About Your Drinking Or Drug Use?: No Have You Ever Had a Drink or Used Drugs First Thing In The Morning to STeady Your Nerves or to Get Rid of a Hangover?: No CAGE-AID Score: 1  Substance Abuse Education Offered: Yes(pt refused)    [facility] Clinical Social Worker [phone]

## 2019-10-03 NOTE — Progress Notes (Signed)
Subjective: She seems improved compared to my colleagues previous exam  Exam: Vitals:   10/03/19 0531 10/03/19 0800  BP: 107/66 125/74  Pulse: 75 82  Resp: 12 17  Temp:  98.5 F (36.9 C)  SpO2: 98% 96%   Gen: In bed, NAD Resp: non-labored breathing, no acute distress Abd: soft, nt  Neuro: MS: Lethargic, but arousable, able to tell me her name and that she is at [facility].  She is able to follow commands. CN: Pupils equal round and reactive, visual fields full Motor: Moves all extremities to command, does not give good effort in either lower extremity Sensory: Reports symmetric sensation   Pertinent Labs: CSF WBC 1 CSF RBC 375 CSF Protein 64  TSH 0.27 B12 841 Ammonia 24 Lithium 0.13 CK 24248  Impression: 64 year old female with improving encephalopathy in the setting of cocaine use.  At this point, I would favor continued supportive care and substance cessation.  I suspect that her small vessel infarct is likely a direct result of her substance abuse, though stroke work-up is underway.  Recommendations: 1) continue antiplatelet therapy with asa 2) statin therapy for LDL 168 3) continue supportive therapy.  Roland Rack, MD [facility] [phone]  If 7pm- 7am, please page neurology on call as listed in [place].

## 2019-10-04 ENCOUNTER — Inpatient Hospital Stay (HOSPITAL_COMMUNITY): Payer: Medicaid Other

## 2019-10-04 DIAGNOSIS — R7881 Bacteremia: Secondary | ICD-10-CM

## 2019-10-04 DIAGNOSIS — B957 Other staphylococcus as the cause of diseases classified elsewhere: Secondary | ICD-10-CM

## 2019-10-04 DIAGNOSIS — I6389 Other cerebral infarction: Secondary | ICD-10-CM

## 2019-10-04 DIAGNOSIS — B9561 Methicillin susceptible Staphylococcus aureus infection as the cause of diseases classified elsewhere: Secondary | ICD-10-CM

## 2019-10-04 DIAGNOSIS — L03113 Cellulitis of right upper limb: Secondary | ICD-10-CM

## 2019-10-04 LAB — COMPREHENSIVE METABOLIC PANEL
ALT: 89 U/L — ABNORMAL HIGH (ref 0–44)
AST: 146 U/L — ABNORMAL HIGH (ref 15–41)
Albumin: 2.3 g/dL — ABNORMAL LOW (ref 3.5–5.0)
Alkaline Phosphatase: 47 U/L (ref 38–126)
BUN: 9 mg/dL (ref 8–23)
CO2: 20 mmol/L — ABNORMAL LOW (ref 22–32)
Calcium: 8.3 mg/dL — ABNORMAL LOW (ref 8.9–10.3)
Chloride: 114 mmol/L — ABNORMAL HIGH (ref 98–111)
Creatinine, Ser: 0.59 mg/dL (ref 0.44–1.00)
GFR calc Af Amer: >60 mL/min (ref >60)
GFR calc non Af Amer: >60 mL/min (ref >60)
Glucose, Bld: 99 mg/dL (ref 70–99)
Potassium: 3.8 mmol/L (ref 3.5–5.1)
Sodium: 142 mmol/L (ref 135–145)
Total Bilirubin: <0.1 mg/dL — ABNORMAL LOW (ref 0.3–1.2)
Total Protein: 5.6 g/dL — ABNORMAL LOW (ref 6.5–8.1)

## 2019-10-04 LAB — ECHOCARDIOGRAM COMPLETE
Height: 65 in
Weight: 2380.97 oz

## 2019-10-04 LAB — URINE CULTURE: Culture: >=100000 — AB

## 2019-10-04 LAB — CBC WITH DIFFERENTIAL/PLATELET
Abs Immature Granulocytes: 0.03 10*3/uL (ref 0.00–0.07)
Basophils Absolute: 0 10*3/uL (ref 0.0–0.1)
Basophils Relative: 0 %
Eosinophils Absolute: 0.5 10*3/uL (ref 0.0–0.5)
Eosinophils Relative: 5 %
HCT: 38.3 % (ref 36.0–46.0)
Hemoglobin: 11.3 g/dL — ABNORMAL LOW (ref 12.0–15.0)
Immature Granulocytes: 0 %
Lymphocytes Relative: 33 %
Lymphs Abs: 3.1 10*3/uL (ref 0.7–4.0)
MCH: 22.7 pg — ABNORMAL LOW (ref 26.0–34.0)
MCHC: 29.5 g/dL — ABNORMAL LOW (ref 30.0–36.0)
MCV: 77.1 fL — ABNORMAL LOW (ref 80.0–100.0)
Monocytes Absolute: 0.6 10*3/uL (ref 0.1–1.0)
Monocytes Relative: 7 %
Neutro Abs: 5.2 10*3/uL (ref 1.7–7.7)
Neutrophils Relative %: 55 %
Platelets: 230 10*3/uL (ref 150–400)
RBC: 4.97 MIL/uL (ref 3.87–5.11)
RDW: 14.8 % (ref 11.5–15.5)
WBC: 9.4 10*3/uL (ref 4.0–10.5)
nRBC: 0 % (ref 0.0–0.2)

## 2019-10-04 LAB — CULTURE, BLOOD (ROUTINE X 2)

## 2019-10-04 LAB — PHOSPHORUS: Phosphorus: 1.9 mg/dL — ABNORMAL LOW (ref 2.5–4.6)

## 2019-10-04 LAB — CK: Total CK: 4362 U/L — ABNORMAL HIGH (ref 38–234)

## 2019-10-04 LAB — MAGNESIUM: Magnesium: 2 mg/dL (ref 1.7–2.4)

## 2019-10-04 LAB — T3: T3, Total: 162 ng/dL (ref 71–180)

## 2019-10-04 MED ORDER — TRAMADOL HCL 50 MG PO TABS
50.0000 mg | ORAL_TABLET | Freq: Two times a day (BID) | ORAL | Status: AC | PRN
  Administered 2019-10-04: 50 mg via ORAL
  Filled 2019-10-04: qty 1

## 2019-10-04 MED ORDER — K PHOS MONO-SOD PHOS DI & MONO 155-852-130 MG PO TABS
250.0000 mg | ORAL_TABLET | Freq: Two times a day (BID) | ORAL | Status: AC
  Administered 2019-10-04 (×2): 250 mg via ORAL
  Filled 2019-10-04 (×2): qty 1

## 2019-10-04 MED ORDER — IOHEXOL 300 MG/ML  SOLN
100.0000 mL | Freq: Once | INTRAMUSCULAR | Status: AC | PRN
  Administered 2019-10-04: 100 mL via INTRAVENOUS

## 2019-10-04 NOTE — Consult Note (Signed)
[facility] [place] for Infectious Disease       Reason for Consult: bacteremia   Referring Physician: Dr. Sloan Leiter  Principal Problem:   AMS (altered mental status) Active Problems:   Schizophrenia ([place])   UTI (urinary tract infection)   Bipolar 1 disorder ([place])   AKI (acute kidney injury) ([place])   Transaminitis   CVA (cerebral vascular accident) ([place])   Bacteremia due to Staphylococcus aureus   Dehydration with hypernatremia   . aspirin  324 mg Oral Daily  . enoxaparin (LOVENOX) injection  40 mg Subcutaneous QHS  . lithium citrate  198 mg Oral TID  . OLANZapine  10 mg Oral Daily  . OLANZapine  20 mg Oral QHS  . phosphorus  250 mg Oral BID  . Valbenazine Tosylate  1 tablet Oral QHS    Recommendations:  vancomycin for 1 more day  Doxycycline 100 mg twice a day for 10 days at discharge  Assessment: She has Staph hominis in her blood cultures in 3/4 bottles and CT with no abscess, just soft tissue edema of right upper arm. She had some leukocytosis initially but is resolved and has had no fever.    Antibiotics: Vancomycin, ampicillin, acyclovir, ceftriaxone  HPI: Karen Best is a 64 y.o. female with bipolar disease, asthma who came in with AMS.  Work up for CNS infection unrevealing but blood cultures with Staph hominis.  She has some leukocytosis but resolved.  No fever.  She denies dysuria, sob, cough.  Was found with a crack pipe.  Does not give any other history.     Review of Systems:  Constitutional: negative for fevers, chills and anorexia Gastrointestinal: negative for nausea and diarrhea All other systems reviewed and are negative    Past Medical History:  Diagnosis Date  . Asthma   . Bipolar 1 disorder ([place])   . Fibromyalgia   . Schizophrenia ([place])   . Tobacco abuse     Social History   Tobacco Use  . Smoking status: Current Every Day Smoker    Packs/day: 0.50    Types: Cigarettes  . Smokeless tobacco: Never Used  Substance Use Topics  .  Alcohol use: Yes    Comment: occ  . Drug use: Yes    Types: Cocaine    Comment: crack cocaine (last use 10/25/2018)    Family History  Problem Relation Age of Onset  . Liver cancer Neg Hx   . Liver disease Neg Hx     No Known Allergies  Physical Exam: Constitutional: in no apparent distress  Vitals:   10/04/19 0733 10/04/19 1230  BP: 120/60 126/62  Pulse: 73 84  Resp: 13 14  Temp: 97.9 F (36.6 C) 97.9 F (36.6 C)  SpO2: 97% 99%   EYES: anicteric Cardiovascular: Cor RRR Respiratory: clear; GI: Bowel sounds are normal, liver is not enlarged, spleen is not enlarged Musculoskeletal: no pedal edema noted Skin: negatives: no rash Neuro: non-focal  Lab Results  Component Value Date   WBC 9.4 10/03/2019   HGB 11.3 (L) 10/03/2019   HCT 38.3 10/03/2019   MCV 77.1 (L) 10/03/2019   PLT 230 10/03/2019    Lab Results  Component Value Date   CREATININE 0.59 10/03/2019   BUN 9 10/03/2019   NA 142 10/03/2019   K 3.8 10/03/2019   CL 114 (H) 10/03/2019   CO2 20 (L) 10/03/2019    Lab Results  Component Value Date   ALT 89 (H) 10/03/2019   AST 146 (  H) 10/03/2019   ALKPHOS 47 10/03/2019     Microbiology: Recent Results (from the past 240 hour(s))  SARS Coronavirus 2 by RT PCR (hospital order, performed in [facility] hospital lab) Nasopharyngeal Nasopharyngeal Swab     Status: None   Collection Time: 10/01/19  5:51 PM   Specimen: Nasopharyngeal Swab  Result Value Ref Range Status   SARS Coronavirus 2 NEGATIVE NEGATIVE Final    Comment: (NOTE) SARS-CoV-2 target nucleic acids are NOT DETECTED. The SARS-CoV-2 RNA is generally detectable in upper and lower respiratory specimens during the acute phase of infection. The lowest concentration of SARS-CoV-2 viral copies this assay can detect is 250 copies / mL. A negative result does not preclude SARS-CoV-2 infection and should not be used as the sole basis for treatment or other patient management decisions.  A negative  result may occur with improper specimen collection / handling, submission of specimen other than nasopharyngeal swab, presence of viral mutation(s) within the areas targeted by this assay, and inadequate number of viral copies (<250 copies / mL). A negative result must be combined with clinical observations, patient history, and epidemiological information. Fact Sheet for Patients:   StrictlyIdeas.no Fact Sheet for Healthcare Providers: BankingDealers.co.za This test is not yet approved or cleared  by the [place] FDA and has been authorized for detection and/or diagnosis of SARS-CoV-2 by FDA under an Emergency Use Authorization (EUA).  This EUA will remain in effect (meaning this test can be used) for the duration of the COVID-19 declaration under Section 564(b)(1) of the Act, 21 U.S.C. section 360bbb-3(b)(1), unless the authorization is terminated or revoked sooner. Performed at [facility], [place] [address] [place], [place] [address]   Culture, blood (routine x 2)     Status: Abnormal   Collection Time: 10/02/19  1:15 AM   Specimen: BLOOD  Result Value Ref Range Status   Specimen Description BLOOD RIGHT ARM  Final   Special Requests   Final    BOTTLES DRAWN AEROBIC AND ANAEROBIC Blood Culture results may not be optimal due to an inadequate volume of blood received in culture bottles   Culture  Setup Time   Final    GRAM POSITIVE COCCI IN CLUSTERS IN BOTH AEROBIC AND ANAEROBIC BOTTLES CRITICAL VALUE NOTED.  VALUE IS CONSISTENT WITH PREVIOUSLY REPORTED AND CALLED VALUE.    Culture (A)  Final    STAPHYLOCOCCUS HOMINIS SUSCEPTIBILITIES PERFORMED ON PREVIOUS CULTURE WITHIN THE LAST 5 DAYS. Performed at [facility], [place] [address] [place], [place] [address]    Report Status 10/04/2019 FINAL  Final  Culture, blood (routine x 2)     Status: Abnormal   Collection Time: 10/02/19  1:15 AM   Specimen: BLOOD  Result Value  Ref Range Status   Specimen Description BLOOD LEFT ARM  Final   Special Requests   Final    BOTTLES DRAWN AEROBIC AND ANAEROBIC Blood Culture results may not be optimal due to an inadequate volume of blood received in culture bottles   Culture  Setup Time   Final    GRAM POSITIVE COCCI IN CLUSTERS AEROBIC BOTTLE ONLY CRITICAL RESULT CALLED TO, READ BACK BY AND VERIFIED WITH: PHARMD A MEYER AT 2041 10/02/19 BY L BENFIELD Performed at [facility], [place] [address], [place], [place] [address]    Culture STAPHYLOCOCCUS HOMINIS (A)  Final   Report Status 10/04/2019 FINAL  Final   Organism ID, Bacteria STAPHYLOCOCCUS HOMINIS  Final  Susceptibility   Staphylococcus hominis - MIC*    CIPROFLOXACIN <=0.5 SENSITIVE Sensitive     ERYTHROMYCIN >=8 RESISTANT Resistant     GENTAMICIN <=0.5 SENSITIVE Sensitive     OXACILLIN >=4 RESISTANT Resistant     TETRACYCLINE 4 SENSITIVE Sensitive     VANCOMYCIN <=0.5 SENSITIVE Sensitive     TRIMETH/SULFA 80 RESISTANT Resistant     CLINDAMYCIN <=0.25 SENSITIVE Sensitive     RIFAMPIN <=0.5 SENSITIVE Sensitive     Inducible Clindamycin NEGATIVE Sensitive     * STAPHYLOCOCCUS HOMINIS  Blood Culture ID Panel (Reflexed)     Status: Abnormal   Collection Time: 10/02/19  1:15 AM  Result Value Ref Range Status   Enterococcus species NOT DETECTED NOT DETECTED Final   Listeria monocytogenes NOT DETECTED NOT DETECTED Final   Staphylococcus species DETECTED (A) NOT DETECTED Final    Comment: Methicillin (oxacillin) resistant coagulase negative staphylococcus. Possible blood culture contaminant (unless isolated from more than one blood culture draw or clinical case suggests pathogenicity). No antibiotic treatment is indicated for blood  culture contaminants. CRITICAL RESULT CALLED TO, READ BACK BY AND VERIFIED WITH: PHARMD A MEYER AT 2041 10/02/19 BY L BENFIELD    Staphylococcus aureus (BCID) NOT DETECTED NOT DETECTED Final   Methicillin resistance DETECTED (A)  NOT DETECTED Final    Comment: CRITICAL RESULT CALLED TO, READ BACK BY AND VERIFIED WITH: PHARMD A MEYER AT 2041 10/02/19 BY L BENFIELD    Streptococcus species NOT DETECTED NOT DETECTED Final   Streptococcus agalactiae NOT DETECTED NOT DETECTED Final   Streptococcus pneumoniae NOT DETECTED NOT DETECTED Final   Streptococcus pyogenes NOT DETECTED NOT DETECTED Final   Acinetobacter baumannii NOT DETECTED NOT DETECTED Final   Enterobacteriaceae species NOT DETECTED NOT DETECTED Final   Enterobacter cloacae complex NOT DETECTED NOT DETECTED Final   Escherichia coli NOT DETECTED NOT DETECTED Final   Klebsiella oxytoca NOT DETECTED NOT DETECTED Final   Klebsiella pneumoniae NOT DETECTED NOT DETECTED Final   Proteus species NOT DETECTED NOT DETECTED Final   Serratia marcescens NOT DETECTED NOT DETECTED Final   Haemophilus influenzae NOT DETECTED NOT DETECTED Final   Neisseria meningitidis NOT DETECTED NOT DETECTED Final   Pseudomonas aeruginosa NOT DETECTED NOT DETECTED Final   Candida albicans NOT DETECTED NOT DETECTED Final   Candida glabrata NOT DETECTED NOT DETECTED Final   Candida krusei NOT DETECTED NOT DETECTED Final   Candida parapsilosis NOT DETECTED NOT DETECTED Final   Candida tropicalis NOT DETECTED NOT DETECTED Final    Comment: Performed at [facility], [place], [place], [place] [address]  CSF culture     Status: None (Preliminary result)   Collection Time: 10/02/19  2:46 AM   Specimen: CSF; Cerebrospinal Fluid  Result Value Ref Range Status   Specimen Description CSF  Final   Special Requests NONE  Final   Gram Stain   Final    CYTOSPIN SMEAR WBC PRESENT,BOTH PMN AND MONONUCLEAR NO ORGANISMS SEEN    Culture   Final    NO GROWTH 2 DAYS Performed at [facility], [place] [address], [place],  [address]    Report Status PENDING  Incomplete  Culture, fungus without smear     Status: None (Preliminary result)   Collection Time: 10/02/19  2:47 AM    Specimen: CSF; Cerebrospinal Fluid  Result Value Ref Range Status   Specimen Description CSF  Final   Special Requests NONE  Final   Culture   Final  NO GROWTH 2 DAYS Performed at [facility], [place] [address] [place], [place] [address]    Report Status PENDING  Incomplete  Culture, Urine     Status: Abnormal   Collection Time: 10/02/19  1:00 PM   Specimen: Urine, Random  Result Value Ref Range Status   Specimen Description URINE, RANDOM  Final   Special Requests   Final    NONE Performed at [facility], [place] [address], [place], [place] [address]    Culture >=100,000 COLONIES/mL ESCHERICHIA COLI (A)  Final   Report Status 10/04/2019 FINAL  Final   Organism ID, Bacteria ESCHERICHIA COLI (A)  Final      Susceptibility   Escherichia coli - MIC*    AMPICILLIN <=2 SENSITIVE Sensitive     CEFAZOLIN <=4 SENSITIVE Sensitive     CEFTRIAXONE <=1 SENSITIVE Sensitive     CIPROFLOXACIN <=0.25 SENSITIVE Sensitive     GENTAMICIN <=1 SENSITIVE Sensitive     IMIPENEM <=0.25 SENSITIVE Sensitive     NITROFURANTOIN <=16 SENSITIVE Sensitive     TRIMETH/SULFA <=20 SENSITIVE Sensitive     AMPICILLIN/SULBACTAM <=2 SENSITIVE Sensitive     PIP/TAZO <=4 SENSITIVE Sensitive     * >=100,000 COLONIES/mL ESCHERICHIA COLI  Culture, blood (routine x 2)     Status: None (Preliminary result)   Collection Time: 10/04/19 12:01 AM   Specimen: BLOOD  Result Value Ref Range Status   Specimen Description BLOOD LEFT ARM  Final   Special Requests   Final    BOTTLES DRAWN AEROBIC ONLY Blood Culture adequate volume   Culture   Final    NO GROWTH < 12 HOURS Performed at [facility], [address] [place], [place] [address]    Report Status PENDING  Incomplete  Culture, blood (routine x 2)     Status: None (Preliminary result)   Collection Time: 10/04/19 12:04 AM   Specimen: BLOOD  Result Value Ref Range Status   Specimen Description BLOOD RIGHT ARM  Final   Special Requests   Final     BOTTLES DRAWN AEROBIC ONLY Blood Culture results may not be optimal due to an inadequate volume of blood received in culture bottles   Culture   Final    NO GROWTH < 12 HOURS Performed at [facility], [place] [address] [place], [place] [address]    Report Status PENDING  Incomplete    Thayer Headings, [place] [facility] [place] [email or web address] 10/04/2019, 2:50 PM

## 2019-10-04 NOTE — Progress Notes (Signed)
PROGRESS NOTE    Karen Best  CWC:[id number] DOB: [birth year] DOA: 10/01/2019 PCP: [facility]    Brief Narrative:  64 year old female with history of bipolar 1 disorder, schizophrenia on lithium and Zyprexa, asthma, fibromyalgia, polysubstance abuse including cocaine and tobacco presented to the emergency room with altered mental status and unresponsiveness.  Patient was noted obtunded on arrival.  Reportedly at baseline, she is ambulatory and verbal.  Patient noted to be weak on the right side, hypotensive and had crack pipe in her hand.  Recently seen the neurologist office per ambulatory dysfunction.  In the emergency room she is afebrile.  Hypertensive.  Protecting her airway.  Leukocytosis with WC count 13.9.  Potassium 5.2.  Elevated AST and ALT.  UDS positive for cocaine.  MRI positive for acute posterior infarct.  Due to severity of condition, she was admitted and treated for acute stroke as well as acute meningitis or generalized infection.   Assessment & Plan:   Principal Problem:   AMS (altered mental status) Active Problems:   Schizophrenia ([place])   UTI (urinary tract infection)   Bipolar 1 disorder (HCC)   AKI (acute kidney injury) ([place])   Transaminitis   CVA (cerebral vascular accident) ([place])   Bacteremia due to Staphylococcus aureus   Dehydration with hypernatremia   Acute metabolic encephalopathy, multifactorial in the setting of underlying bipolar disorder and schizophrenia.  Bacteremia, UTI, cocaine use and or in combination.  Bacteremia due to Staphylococcus aureus: 2/2 blood cultures positive for coag negative Staphylococcus.  We will continue vancomycin.  Repeat blood cultures drawn today.  Echocardiogram today.  Though this is coag negative, since both cultures are positive will need to treat.  Case discussed with infectious disease, consult placed. Right arm swelling with blister, will check CT scan.  No clinical evidence of  abscess or infection.  Acute UTI present on admission secondary to E. coli: Rocephin for 3 days.  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  Negative for bacterial infection.  HSV antigen negative.  Discontinue acyclovir.  Acute stroke present on admission: Clinical findings, obtunded.  Right hemiplegia. CT head findings, generalized parenchymal atrophy.  No acute findings. MRI of the brain, 6 mm acute infarct in the posterior left subinsular white matter.  Chronic small vessel disease.  Cavernous sinus thrombosis ruled out with MR venogram. 2D echocardiogram, pending.  Being done today. Antiplatelet therapy, none at home.  Started on aspirin. LDL 168.  AST ALT more than 200.  Trending down.  Will start statin once CK level and LFTs normalized. Hemoglobin A1c, 6.  No indication for treatment DVT prophylaxis, Lovenox. Therapy recommendations, skilled nursing facility.  Suppressed TSH: On lithium.  T4 is normal.   Hypernatremia: Due to poor oral intake.  Normalizing.  Continue on dextrose half-normal saline and monitor levels.  Recheck tomorrow morning.  Bipolar disorder and schizophrenia: On lithium and olanzapine that will be continued.  Lithium level was subtherapeutic.  Previous history of lithium toxicity.  Reportedly she was not taking medications.  Acute traumatic rhabdomyolysis: Due to cocaine, found down after a fall.  Renal functions normalizing.  Continue IV fluids.  We will recheck levels tomorrow morning.  Case discussed with neurology.   DVT prophylaxis: Lovenox subcu Code Status: Full code Family Communication: None at the bedside Disposition Plan: Status is: Inpatient  Remains inpatient appropriate because:Inpatient level of care appropriate due to severity of illness   Dispo: The patient is from: Home  Anticipated d/c is to: SNF              Anticipated d/c date is: 2 days.              Patient currently is not medically stable to  d/c.         Consultants:   Neurology  Infectious disease  Procedures:   Lumbar puncture  Antimicrobials:  Antibiotics Given (last 72 hours)    Date/Time Action Medication Dose Rate   10/01/19 2108 New Bag/Given   cefTRIAXone (ROCEPHIN) 1 g in sodium chloride 0.9 % 100 mL IVPB 1 g 200 mL/hr   10/02/19 0056 New Bag/Given   cefTRIAXone (ROCEPHIN) 1 g in sodium chloride 0.9 % 100 mL IVPB 1 g 200 mL/hr   10/02/19 0127 New Bag/Given   ampicillin (OMNIPEN) 2 g in sodium chloride 0.9 % 100 mL IVPB 2 g 300 mL/hr   10/02/19 0141 New Bag/Given   vancomycin (VANCOREADY) IVPB 1250 mg/250 mL 1,250 mg 166.7 mL/hr   10/02/19 0408 New Bag/Given   acyclovir (ZOVIRAX) 680 mg in dextrose 5 % 100 mL IVPB 680 mg 113.6 mL/hr   10/02/19 3748 New Bag/Given   ampicillin (OMNIPEN) 2 g in sodium chloride 0.9 % 100 mL IVPB 2 g 300 mL/hr   10/02/19 1035 New Bag/Given   ampicillin (OMNIPEN) 2 g in sodium chloride 0.9 % 100 mL IVPB 2 g 300 mL/hr   10/02/19 1055 New Bag/Given   cefTRIAXone (ROCEPHIN) 2 g in sodium chloride 0.9 % 100 mL IVPB 2 g 200 mL/hr   10/02/19 1315 New Bag/Given   ampicillin (OMNIPEN) 2 g in sodium chloride 0.9 % 100 mL IVPB 2 g 300 mL/hr   10/02/19 1415 New Bag/Given   acyclovir (ZOVIRAX) 680 mg in dextrose 5 % 100 mL IVPB 680 mg 113.6 mL/hr   10/02/19 1748 New Bag/Given   ampicillin (OMNIPEN) 2 g in sodium chloride 0.9 % 100 mL IVPB 2 g 300 mL/hr   10/02/19 2123 New Bag/Given   ampicillin (OMNIPEN) 2 g in sodium chloride 0.9 % 100 mL IVPB 2 g 300 mL/hr   10/02/19 2208 New Bag/Given   cefTRIAXone (ROCEPHIN) 2 g in sodium chloride 0.9 % 100 mL IVPB 2 g 200 mL/hr   10/03/19 0105 New Bag/Given   ampicillin (OMNIPEN) 2 g in sodium chloride 0.9 % 100 mL IVPB 2 g 300 mL/hr   10/03/19 0144 New Bag/Given   acyclovir (ZOVIRAX) 680 mg in dextrose 5 % 100 mL IVPB 680 mg 113.6 mL/hr   10/03/19 0155 New Bag/Given   vancomycin (VANCOCIN) IVPB 1000 mg/200 mL premix 1,000 mg 200 mL/hr    10/03/19 0546 New Bag/Given   ampicillin (OMNIPEN) 2 g in sodium chloride 0.9 % 100 mL IVPB 2 g 300 mL/hr   10/03/19 1304 New Bag/Given   acyclovir (ZOVIRAX) 680 mg in dextrose 5 % 100 mL IVPB 680 mg 113.6 mL/hr   10/03/19 2150 New Bag/Given   cefTRIAXone (ROCEPHIN) 1 g in sodium chloride 0.9 % 100 mL IVPB 1 g 200 mL/hr   10/04/19 0226 New Bag/Given   acyclovir (ZOVIRAX) 680 mg in dextrose 5 % 100 mL IVPB 680 mg 113.6 mL/hr   10/04/19 0227 New Bag/Given   vancomycin (VANCOCIN) IVPB 1000 mg/200 mL premix 1,000 mg 200 mL/hr        Subjective: Patient seen and examined.  Alert and communicating.  Poor historian.  Was eating breakfast on my interview.  No overnight events.  Not using  right hand and also not complaining of pain.  Afebrile.  Denies recent use of cocaine.  She says she used to do it in the past.  Objective: Vitals:   10/03/19 2000 10/04/19 0004 10/04/19 0336 10/04/19 0733  BP: 129/69   120/60  Pulse: 83   73  Resp: 20   13  Temp: 98.4 F (36.9 C) 98.8 F (37.1 C) 98.6 F (37 C) 97.9 F (36.6 C)  TempSrc: Oral Oral Oral Oral  SpO2: 100%   97%  Weight:      Height:        Intake/Output Summary (Last 24 hours) at 10/04/2019 1124 Last data filed at 10/04/2019 0902 Gross per 24 hour  Intake 1774.88 ml  Output 3900 ml  Net -2125.12 ml   Filed Weights   10/02/19 0316  Weight: 67.5 kg    Examination: Physical Exam  Constitutional: She is well-developed, well-nourished, and in no distress.  Looks comfortable.  Chronically sick looking.  Not in any distress.  HENT:  Head: Normocephalic.  Eyes: Pupils are equal, round, and reactive to light.  Cardiovascular: Regular rhythm.  Pulmonary/Chest: Breath sounds normal.  Abdominal: Bowel sounds are normal.  Musculoskeletal:     Cervical back: Neck supple.     Comments: Right upper extremity with swelling, no erythema or redness, no fluctuation.  Tenderness on movement of the right shoulder.  Probably infiltrated IV.   Ruptured vesicle on the lateral upper aspect of the arm.  Neurological: She is alert.  She is mostly oriented, however has some cognitive delay response. Cranial nerves no deficit. Right upper and lower extremity with hemiplegia, 3/5.  Upper extremity more than lower extremity.  Psychiatric:  Flat affect.  Normal mood.      Data Reviewed: I have personally reviewed following labs and imaging studies  CBC: Recent Labs  Lab 10/01/19 1729 10/01/19 1801 10/02/19 0518 10/03/19 2350  WBC 13.9*  --  16.4* 9.4  NEUTROABS 11.6*  --   --  5.2  HGB 16.0* 17.0* 15.6* 11.3*  HCT 53.5* 50.0* 53.6* 38.3  MCV 75.7*  --  77.0* 77.1*  PLT 475*  --  409* 213   Basic Metabolic Panel: Recent Labs  Lab 10/01/19 1729 10/01/19 1801 10/02/19 0518 10/03/19 2350  NA 145 148* 150* 142  K 5.2* 4.9 4.6 3.8  CL 116* 117* 122* 114*  CO2 18*  --  19* 20*  GLUCOSE 142* 142* 140* 99  BUN 52* 61* 37* 9  CREATININE 1.18* 1.30* 0.93 0.59  CALCIUM 9.5  --  9.3 8.3*  MG  --   --   --  2.0  PHOS  --   --   --  1.9*   GFR: Estimated Creatinine Clearance: 65.6 mL/min (by C-G formula based on SCr of 0.59 mg/dL). Liver Function Tests: Recent Labs  Lab 10/01/19 1729 10/02/19 0518 10/03/19 2350  AST 379* 362* 146*  ALT 153* 159* 89*  ALKPHOS 71 67 47  BILITOT 0.8 0.8 <0.1*  PROT 8.8* 7.3 5.6*  ALBUMIN 3.7 3.0* 2.3*   No results for input(s): LIPASE, AMYLASE in the last 168 hours. Recent Labs  Lab 10/02/19 0055  AMMONIA 24   Coagulation Profile: Recent Labs  Lab 10/01/19 1729  INR 1.1   Cardiac Enzymes: Recent Labs  Lab 10/01/19 2234 10/03/19 0315 10/03/19 2350  CKTOTAL 24,248* 5,699* 4,362*   BNP (last 3 results) No results for input(s): PROBNP in the last 8760 hours. HbA1C: Recent Labs  10/02/19 0518  HGBA1C 6.0*   CBG: Recent Labs  Lab 10/01/19 1714  GLUCAP 150*   Lipid Profile: Recent Labs    10/02/19 0518  CHOL 241*  HDL 41  LDLCALC 168*  TRIG 158*    CHOLHDL 5.9   Thyroid Function Tests: Recent Labs    10/02/19 0118 10/03/19 1454  TSH 0.273*  --   FREET4  --  1.05   Anemia Panel: Recent Labs    10/02/19 0117  VITAMINB12 841   Sepsis Labs: No results for input(s): PROCALCITON, LATICACIDVEN in the last 168 hours.  Recent Results (from the past 240 hour(s))  SARS Coronavirus 2 by RT PCR (hospital order, performed in [facility] hospital lab) Nasopharyngeal Nasopharyngeal Swab     Status: None   Collection Time: 10/01/19  5:51 PM   Specimen: Nasopharyngeal Swab  Result Value Ref Range Status   SARS Coronavirus 2 NEGATIVE NEGATIVE Final    Comment: (NOTE) SARS-CoV-2 target nucleic acids are NOT DETECTED. The SARS-CoV-2 RNA is generally detectable in upper and lower respiratory specimens during the acute phase of infection. The lowest concentration of SARS-CoV-2 viral copies this assay can detect is 250 copies / mL. A negative result does not preclude SARS-CoV-2 infection and should not be used as the sole basis for treatment or other patient management decisions.  A negative result may occur with improper specimen collection / handling, submission of specimen other than nasopharyngeal swab, presence of viral mutation(s) within the areas targeted by this assay, and inadequate number of viral copies (<250 copies / mL). A negative result must be combined with clinical observations, patient history, and epidemiological information. Fact Sheet for Patients:   StrictlyIdeas.no Fact Sheet for Healthcare Providers: BankingDealers.co.za This test is not yet approved or cleared  by the [place] FDA and has been authorized for detection and/or diagnosis of SARS-CoV-2 by FDA under an Emergency Use Authorization (EUA).  This EUA will remain in effect (meaning this test can be used) for the duration of the COVID-19 declaration under Section 564(b)(1) of the Act, 21 U.S.C. section  360bbb-3(b)(1), unless the authorization is terminated or revoked sooner. Performed at [facility], [place] [address], [place], [place] [address]   Culture, blood (routine x 2)     Status: Abnormal   Collection Time: 10/02/19  1:15 AM   Specimen: BLOOD  Result Value Ref Range Status   Specimen Description BLOOD RIGHT ARM  Final   Special Requests   Final    BOTTLES DRAWN AEROBIC AND ANAEROBIC Blood Culture results may not be optimal due to an inadequate volume of blood received in culture bottles   Culture  Setup Time   Final    GRAM POSITIVE COCCI IN CLUSTERS IN BOTH AEROBIC AND ANAEROBIC BOTTLES CRITICAL VALUE NOTED.  VALUE IS CONSISTENT WITH PREVIOUSLY REPORTED AND CALLED VALUE.    Culture (A)  Final    STAPHYLOCOCCUS HOMINIS SUSCEPTIBILITIES PERFORMED ON PREVIOUS CULTURE WITHIN THE LAST 5 DAYS. Performed at [facility], [place] [address], [place], [place] [address]    Report Status 10/04/2019 FINAL  Final  Culture, blood (routine x 2)     Status: Abnormal   Collection Time: 10/02/19  1:15 AM   Specimen: BLOOD  Result Value Ref Range Status   Specimen Description BLOOD LEFT ARM  Final   Special Requests   Final    BOTTLES DRAWN AEROBIC AND ANAEROBIC Blood Culture results may not be optimal due to an inadequate volume of  blood received in culture bottles   Culture  Setup Time   Final    GRAM POSITIVE COCCI IN CLUSTERS AEROBIC BOTTLE ONLY CRITICAL RESULT CALLED TO, READ BACK BY AND VERIFIED WITH: PHARMD A MEYER AT 2041 10/02/19 BY L BENFIELD Performed at [facility], [place] [address] [place], [place] [address]    Culture STAPHYLOCOCCUS HOMINIS (A)  Final   Report Status 10/04/2019 FINAL  Final   Organism ID, Bacteria STAPHYLOCOCCUS HOMINIS  Final      Susceptibility   Staphylococcus hominis - MIC*    CIPROFLOXACIN <=0.5 SENSITIVE Sensitive     ERYTHROMYCIN >=8 RESISTANT Resistant     GENTAMICIN <=0.5 SENSITIVE Sensitive     OXACILLIN >=4 RESISTANT Resistant       TETRACYCLINE 4 SENSITIVE Sensitive     VANCOMYCIN <=0.5 SENSITIVE Sensitive     TRIMETH/SULFA 80 RESISTANT Resistant     CLINDAMYCIN <=0.25 SENSITIVE Sensitive     RIFAMPIN <=0.5 SENSITIVE Sensitive     Inducible Clindamycin NEGATIVE Sensitive     * STAPHYLOCOCCUS HOMINIS  Blood Culture ID Panel (Reflexed)     Status: Abnormal   Collection Time: 10/02/19  1:15 AM  Result Value Ref Range Status   Enterococcus species NOT DETECTED NOT DETECTED Final   Listeria monocytogenes NOT DETECTED NOT DETECTED Final   Staphylococcus species DETECTED (A) NOT DETECTED Final    Comment: Methicillin (oxacillin) resistant coagulase negative staphylococcus. Possible blood culture contaminant (unless isolated from more than one blood culture draw or clinical case suggests pathogenicity). No antibiotic treatment is indicated for blood  culture contaminants. CRITICAL RESULT CALLED TO, READ BACK BY AND VERIFIED WITH: PHARMD A MEYER AT 2041 10/02/19 BY L BENFIELD    Staphylococcus aureus (BCID) NOT DETECTED NOT DETECTED Final   Methicillin resistance DETECTED (A) NOT DETECTED Final    Comment: CRITICAL RESULT CALLED TO, READ BACK BY AND VERIFIED WITH: PHARMD A MEYER AT 2041 10/02/19 BY L BENFIELD    Streptococcus species NOT DETECTED NOT DETECTED Final   Streptococcus agalactiae NOT DETECTED NOT DETECTED Final   Streptococcus pneumoniae NOT DETECTED NOT DETECTED Final   Streptococcus pyogenes NOT DETECTED NOT DETECTED Final   Acinetobacter baumannii NOT DETECTED NOT DETECTED Final   Enterobacteriaceae species NOT DETECTED NOT DETECTED Final   Enterobacter cloacae complex NOT DETECTED NOT DETECTED Final   Escherichia coli NOT DETECTED NOT DETECTED Final   Klebsiella oxytoca NOT DETECTED NOT DETECTED Final   Klebsiella pneumoniae NOT DETECTED NOT DETECTED Final   Proteus species NOT DETECTED NOT DETECTED Final   Serratia marcescens NOT DETECTED NOT DETECTED Final   Haemophilus influenzae NOT DETECTED NOT  DETECTED Final   Neisseria meningitidis NOT DETECTED NOT DETECTED Final   Pseudomonas aeruginosa NOT DETECTED NOT DETECTED Final   Candida albicans NOT DETECTED NOT DETECTED Final   Candida glabrata NOT DETECTED NOT DETECTED Final   Candida krusei NOT DETECTED NOT DETECTED Final   Candida parapsilosis NOT DETECTED NOT DETECTED Final   Candida tropicalis NOT DETECTED NOT DETECTED Final    Comment: Performed at [facility], [place]. [address], [place], [place] [address]  CSF culture     Status: None (Preliminary result)   Collection Time: 10/02/19  2:46 AM   Specimen: CSF; Cerebrospinal Fluid  Result Value Ref Range Status   Specimen Description CSF  Final   Special Requests NONE  Final   Gram Stain   Final    CYTOSPIN SMEAR WBC PRESENT,BOTH PMN AND MONONUCLEAR NO ORGANISMS  SEEN    Culture   Final    NO GROWTH 2 DAYS Performed at [facility], [place] [address] [place], [place] [address]    Report Status PENDING  Incomplete  Culture, fungus without smear     Status: None (Preliminary result)   Collection Time: Oct 13, 2019  2:47 AM   Specimen: CSF; Cerebrospinal Fluid  Result Value Ref Range Status   Specimen Description CSF  Final   Special Requests NONE  Final   Culture   Final    NO FUNGUS ISOLATED AFTER 1 DAY Performed at [facility], [place] [address], [place], [place] [address]    Report Status PENDING  Incomplete  Culture, Urine     Status: Abnormal   Collection Time: 2019/10/13  1:00 PM   Specimen: Urine, Random  Result Value Ref Range Status   Specimen Description URINE, RANDOM  Final   Special Requests   Final    NONE Performed at [facility], [place] [address] [place], [place] [address]    Culture >=100,000 COLONIES/mL ESCHERICHIA COLI (A)  Final   Report Status 10/04/2019 FINAL  Final   Organism ID, Bacteria ESCHERICHIA COLI (A)  Final      Susceptibility   Escherichia coli - MIC*    AMPICILLIN <=2 SENSITIVE Sensitive     CEFAZOLIN <=4 SENSITIVE  Sensitive     CEFTRIAXONE <=1 SENSITIVE Sensitive     CIPROFLOXACIN <=0.25 SENSITIVE Sensitive     GENTAMICIN <=1 SENSITIVE Sensitive     IMIPENEM <=0.25 SENSITIVE Sensitive     NITROFURANTOIN <=16 SENSITIVE Sensitive     TRIMETH/SULFA <=20 SENSITIVE Sensitive     AMPICILLIN/SULBACTAM <=2 SENSITIVE Sensitive     PIP/TAZO <=4 SENSITIVE Sensitive     * >=100,000 COLONIES/mL ESCHERICHIA COLI  Culture, blood (routine x 2)     Status: None (Preliminary result)   Collection Time: 10/04/19 12:01 AM   Specimen: BLOOD  Result Value Ref Range Status   Specimen Description BLOOD LEFT ARM  Final   Special Requests   Final    BOTTLES DRAWN AEROBIC ONLY Blood Culture adequate volume   Culture   Final    NO GROWTH < 12 HOURS Performed at [facility], [address], [place], [place] [address]    Report Status PENDING  Incomplete  Culture, blood (routine x 2)     Status: None (Preliminary result)   Collection Time: 10/04/19 12:04 AM   Specimen: BLOOD  Result Value Ref Range Status   Specimen Description BLOOD RIGHT ARM  Final   Special Requests   Final    BOTTLES DRAWN AEROBIC ONLY Blood Culture results may not be optimal due to an inadequate volume of blood received in culture bottles   Culture   Final    NO GROWTH < 12 HOURS Performed at [facility], [place] [address], [place], [place] [address]    Report Status PENDING  Incomplete         Radiology Studies: EEG adult  Result Date: 10-13-19 Lora Havens, MD     2019-10-13 12:17 PM Patient Name: SHEANA BIR MRN: [id number] Epilepsy Attending: Lora Havens Referring Physician/Provider: Dr Amie [place] Date: 09/01/2019 Duration: 25.47 mins Patient history: 63yo F with ams. EEG to evaluate for seizure. Level of alertness:  lethargic AEDs during EEG study: None Technical aspects: This EEG study was done with scalp electrodes positioned according to the 10-20 International system of electrode placement. Dealer  activity was acquired at a sampling rate of 500Hz  and reviewed with a high frequency filter of 70Hz  and a low frequency filter of 1Hz . EEG data were recorded continuously and digitally stored. Description: No clear posterior dominant rhythm was seen. EEG showed continuos generalized 3 to 6 Hz theta-delta slowing. Hyperventilation and photic stimulation were not performed.   ABNORMALITY -Continuous slow, generalized IMPRESSION: This study is suggestive of moderate diffuse encephalopathy, nonspecific etiology. No seizures or epileptiform discharges were seen throughout the recording. Priyanka Barbra Sarks        Scheduled Meds: . aspirin  324 mg Oral Daily  . enoxaparin (LOVENOX) injection  40 mg Subcutaneous QHS  . lithium citrate  198 mg Oral TID  . OLANZapine  10 mg Oral Daily  . OLANZapine  20 mg Oral QHS  . phosphorus  250 mg Oral BID  . Valbenazine Tosylate  1 tablet Oral QHS   Continuous Infusions: . dextrose 5 % and 0.45% NaCl 100 mL/hr at 10/03/19 2253  . vancomycin 1,000 mg (10/04/19 0227)     LOS: 3 days    Time spent: 25 minutes    Barb Merino, MD [facility] Pager [phone]

## 2019-10-04 NOTE — Progress Notes (Signed)
Subjective: She continues to improve.   Exam: Vitals:   10/04/19 0336 10/04/19 0733  BP:  120/60  Pulse:  73  Resp:  13  Temp: 98.6 F (37 C) 97.9 F (36.6 C)  SpO2:  97%   Gen: In bed, NAD Resp: non-labored breathing, no acute distress Abd: soft, nt  Neuro: MS: awake, alert, able to tell me her location, name, month, but gives year as 2002.  CN: Pupils equal round and reactive, visual fields full Motor: She has right arm and leg weakness, though right arm is limited by pain and swelling as well.  Sensory: Reports symmetric sensation   Pertinent Labs: CSF WBC 1 CSF RBC 375 CSF Protein 64  TSH 0.27 B12 841 Ammonia 24 Lithium 0.13 CK [address] initially - > 4362 today  Echo - no embolic source  Impression: 64 year old female with improving encephalopathy in the setting of cocaine use.  At this point, I would favor continued supportive care and substance cessation.  I suspect that her small vessel infarct is likely a direct result of her substance abuse.  Recommendations: 1) continue antiplatelet therapy with asa 2) statin therapy for LDL 168 if hepatic function allows.  3) continue supportive therapy, PT,OT 4) please call with further questions or concerns.   Roland Rack, MD [facility] [phone]  If 7pm- 7am, please page neurology on call as listed in [place].

## 2019-10-04 NOTE — Progress Notes (Signed)
Echocardiogram 2D Echocardiogram has been performed.  Karen Best 10/04/2019, 10:13 AM

## 2019-10-04 NOTE — Progress Notes (Signed)
MSW Intern attempted to see pt throughout the day. Pt has been unavailable or sleeping. Will attempt again tomorrow to complete assessment.

## 2019-10-04 NOTE — Progress Notes (Signed)
Pt having c/o 8/10 R-sided chest pain that radiates down side. Pain is new for pt. Breathing, movement, or pressure does not exacerbate it. Pt denies pressure or tightness. EKG is NSR and vitals are stable. NP notified, analgesic ordered, will administer and continue to monitor.

## 2019-10-05 LAB — CBC WITH DIFFERENTIAL/PLATELET
Abs Immature Granulocytes: 0.05 10*3/uL (ref 0.00–0.07)
Basophils Absolute: 0 10*3/uL (ref 0.0–0.1)
Basophils Relative: 0 %
Eosinophils Absolute: 0.8 10*3/uL — ABNORMAL HIGH (ref 0.0–0.5)
Eosinophils Relative: 8 %
HCT: 43.2 % (ref 36.0–46.0)
Hemoglobin: 12.7 g/dL (ref 12.0–15.0)
Immature Granulocytes: 1 %
Lymphocytes Relative: 29 %
Lymphs Abs: 2.8 10*3/uL (ref 0.7–4.0)
MCH: 22.6 pg — ABNORMAL LOW (ref 26.0–34.0)
MCHC: 29.4 g/dL — ABNORMAL LOW (ref 30.0–36.0)
MCV: 76.9 fL — ABNORMAL LOW (ref 80.0–100.0)
Monocytes Absolute: 0.6 10*3/uL (ref 0.1–1.0)
Monocytes Relative: 6 %
Neutro Abs: 5.6 10*3/uL (ref 1.7–7.7)
Neutrophils Relative %: 56 %
Platelets: 245 10*3/uL (ref 150–400)
RBC: 5.62 MIL/uL — ABNORMAL HIGH (ref 3.87–5.11)
RDW: 14.6 % (ref 11.5–15.5)
WBC: 9.8 10*3/uL (ref 4.0–10.5)
nRBC: 0 % (ref 0.0–0.2)

## 2019-10-05 LAB — COMPREHENSIVE METABOLIC PANEL
ALT: 89 U/L — ABNORMAL HIGH (ref 0–44)
AST: 127 U/L — ABNORMAL HIGH (ref 15–41)
Albumin: 2.2 g/dL — ABNORMAL LOW (ref 3.5–5.0)
Alkaline Phosphatase: 47 U/L (ref 38–126)
Anion gap: 6 (ref 5–15)
BUN: 6 mg/dL — ABNORMAL LOW (ref 8–23)
CO2: 25 mmol/L (ref 22–32)
Calcium: 8.6 mg/dL — ABNORMAL LOW (ref 8.9–10.3)
Chloride: 111 mmol/L (ref 98–111)
Creatinine, Ser: 0.58 mg/dL (ref 0.44–1.00)
GFR calc Af Amer: >60 mL/min (ref >60)
GFR calc non Af Amer: >60 mL/min (ref >60)
Glucose, Bld: 92 mg/dL (ref 70–99)
Potassium: 4.4 mmol/L (ref 3.5–5.1)
Sodium: 142 mmol/L (ref 135–145)
Total Bilirubin: 0.6 mg/dL (ref 0.3–1.2)
Total Protein: 5.4 g/dL — ABNORMAL LOW (ref 6.5–8.1)

## 2019-10-05 LAB — CSF CULTURE W GRAM STAIN: Culture: NO GROWTH

## 2019-10-05 LAB — MAGNESIUM: Magnesium: 1.9 mg/dL (ref 1.7–2.4)

## 2019-10-05 LAB — CK: Total CK: 3355 U/L — ABNORMAL HIGH (ref 38–234)

## 2019-10-05 MED ORDER — DOXYCYCLINE HYCLATE 100 MG PO TABS
100.0000 mg | ORAL_TABLET | Freq: Two times a day (BID) | ORAL | Status: DC
  Administered 2019-10-06 – 2019-10-07 (×2): 100 mg via ORAL
  Filled 2019-10-05 (×2): qty 1

## 2019-10-05 NOTE — Progress Notes (Signed)
PROGRESS NOTE    Karen Best  OVZ:[id number] DOB: [birth year] DOA: 10/01/2019 PCP: [facility]    Brief Narrative:  64 year old female with history of bipolar 1 disorder, schizophrenia on lithium and Zyprexa, asthma, fibromyalgia, polysubstance abuse including cocaine and tobacco presented to the emergency room with altered mental status and unresponsiveness.  Patient was noted obtunded on arrival.  Reportedly at baseline, she is ambulatory and verbal.  Patient noted to be weak on the right side, hypotensive and had crack pipe in her hand.  Recently seen the neurologist office per ambulatory dysfunction.  In the emergency room she is afebrile.  Hypertensive.  Protecting her airway.  Leukocytosis with WC count 13.9.  Potassium 5.2.  Elevated AST and ALT.  UDS positive for cocaine.  MRI positive for acute posterior infarct.  Due to severity of condition, she was admitted and treated for acute stroke as well as acute meningitis or generalized infection.   Assessment & Plan:   Principal Problem:   AMS (altered mental status) Active Problems:   Schizophrenia ([place])   UTI (urinary tract infection)   Bipolar 1 disorder (HCC)   AKI (acute kidney injury) ([place])   Transaminitis   CVA (cerebral vascular accident) ([place])   Bacteremia due to Staphylococcus aureus   Dehydration with hypernatremia   Acute metabolic encephalopathy, multifactorial in the setting of underlying bipolar disorder and schizophrenia.  Bacteremia, UTI, cocaine use and or in combination.  Bacteremia due to Staphylococcus Hominis: 3/4 blood cultures positive for coag negative Staphylococcus. We will continue vancomycin.   Repeat blood cultures 6/8 negative so far. Echocardiogram normal.  Infectious disease recommended treatment with oral antibiotics for 2 weeks. CT scan of the right arm with swelling, no collections.  Acute UTI present on admission secondary to E. coli: Treated with Rocephin  for 3 days.  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  Ruled out.  Negative for bacterial infection.  HSV antigen negative.  Discontinue acyclovir.  Acute stroke present on admission: Clinical findings, obtunded.  Right hemiplegia. CT head findings, generalized parenchymal atrophy.  No acute findings. MRI of the brain, 6 mm acute infarct in the posterior left subinsular white matter.  Chronic small vessel disease.  Cavernous sinus thrombosis ruled out with MR venogram. 2D echocardiogram, pending.  Being done today. Antiplatelet therapy, none at home.  Started on aspirin. LDL 168.  AST ALT more than 200.  Trending down.  Will start statin once CK level normalizes. Hemoglobin A1c, 6.  No indication for treatment DVT prophylaxis, Lovenox. Therapy recommendations, skilled nursing facility.  Suppressed TSH: On lithium.  T4 is normal.   Hypernatremia: Due to poor oral intake.  Normalizing.  Continue on dextrose half-normal saline and monitor levels.  Recheck tomorrow morning.  Continue maintenance fluid for rhabdomyolysis.  Bipolar disorder and schizophrenia: On lithium and olanzapine that will be continued.  Lithium level was subtherapeutic.  Previous history of lithium toxicity.  Reportedly she was not taking medications.  Acute traumatic rhabdomyolysis: Due to cocaine, found down after a fall.  Renal functions normalizing.  Continue IV fluids.  Continue IV fluids until normalizes.   DVT prophylaxis: Lovenox subcu Code Status: Full code Family Communication: None at the bedside.  Patient states she is talking to family. Disposition Plan: Status is: Inpatient  Remains inpatient appropriate because:Inpatient level of care appropriate due to severity of illness   Dispo: The patient is from: Home  Anticipated d/c is to: SNF              Anticipated d/c date is: 2 days.              Patient currently is not medically stable to d/c.         Consultants:     Neurology  Infectious disease  Procedures:   Lumbar puncture  Antimicrobials:  Antibiotics Given (last 72 hours)    Date/Time Action Medication Dose Rate   10/02/19 1415 New Bag/Given   acyclovir (ZOVIRAX) 680 mg in dextrose 5 % 100 mL IVPB 680 mg 113.6 mL/hr   10/02/19 1748 New Bag/Given   ampicillin (OMNIPEN) 2 g in sodium chloride 0.9 % 100 mL IVPB 2 g 300 mL/hr   10/02/19 2123 New Bag/Given   ampicillin (OMNIPEN) 2 g in sodium chloride 0.9 % 100 mL IVPB 2 g 300 mL/hr   10/02/19 2208 New Bag/Given   cefTRIAXone (ROCEPHIN) 2 g in sodium chloride 0.9 % 100 mL IVPB 2 g 200 mL/hr   10/03/19 0105 New Bag/Given   ampicillin (OMNIPEN) 2 g in sodium chloride 0.9 % 100 mL IVPB 2 g 300 mL/hr   10/03/19 0144 New Bag/Given   acyclovir (ZOVIRAX) 680 mg in dextrose 5 % 100 mL IVPB 680 mg 113.6 mL/hr   10/03/19 0155 New Bag/Given   vancomycin (VANCOCIN) IVPB 1000 mg/200 mL premix 1,000 mg 200 mL/hr   10/03/19 0546 New Bag/Given   ampicillin (OMNIPEN) 2 g in sodium chloride 0.9 % 100 mL IVPB 2 g 300 mL/hr   10/03/19 1304 New Bag/Given   acyclovir (ZOVIRAX) 680 mg in dextrose 5 % 100 mL IVPB 680 mg 113.6 mL/hr   10/03/19 2150 New Bag/Given   cefTRIAXone (ROCEPHIN) 1 g in sodium chloride 0.9 % 100 mL IVPB 1 g 200 mL/hr   10/04/19 0226 New Bag/Given   acyclovir (ZOVIRAX) 680 mg in dextrose 5 % 100 mL IVPB 680 mg 113.6 mL/hr   10/04/19 0227 New Bag/Given   vancomycin (VANCOCIN) IVPB 1000 mg/200 mL premix 1,000 mg 200 mL/hr   10/05/19 0319 New Bag/Given   vancomycin (VANCOCIN) IVPB 1000 mg/200 mL premix 1,000 mg 200 mL/hr        Subjective: Patient seen and examined.  Sitting in couch and eating breakfast in the morning.  No overnight events.  She herself denies any complaints other than unable to move her right arm much.  She has poor insight into her disease process.  Objective: Vitals:   10/04/19 2342 10/05/19 0402 10/05/19 0838 10/05/19 1208  BP: 127/73 (!) 151/74 118/66 (!)  148/87  Pulse: 74 70 79 80  Resp: 14 15 15 15   Temp: 98.3 F (36.8 C) 98.3 F (36.8 C) 97.7 F (36.5 C) 98.3 F (36.8 C)  TempSrc: Axillary Axillary Oral Axillary  SpO2: 97% 97% 98% 97%  Weight:      Height:        Intake/Output Summary (Last 24 hours) at 10/05/2019 1414 Last data filed at 10/05/2019 1209 Gross per 24 hour  Intake 2780 ml  Output 3800 ml  Net -1020 ml   Filed Weights   10/02/19 0316  Weight: 67.5 kg    Examination: Physical Exam  Constitutional: She is well-developed, well-nourished, and in no distress.  Looks comfortable on room air.  HENT:  Head: Normocephalic.  Eyes: Pupils are equal, round, and reactive to light.  Cardiovascular: Regular rhythm.  Pulmonary/Chest: Breath sounds normal.  Abdominal: Bowel sounds are normal.  Musculoskeletal:     Cervical back: Neck supple.     Comments: Right upper extremity with diffuse swelling, no localized fluctuation or erythema.  Neurological: She is alert.  She is mostly oriented, however has some cognitive delay response. Cranial nerves no deficit. Right upper and lower extremity with hemiplegia, 3/5.  Upper extremity more than lower extremity.  Psychiatric:  Flat affect.  Normal mood.      Data Reviewed: I have personally reviewed following labs and imaging studies  CBC: Recent Labs  Lab 10/01/19 1729 10/01/19 1801 10/02/19 0518 10/03/19 2350 10/05/19 0358  WBC 13.9*  --  16.4* 9.4 9.8  NEUTROABS 11.6*  --   --  5.2 5.6  HGB 16.0* 17.0* 15.6* 11.3* 12.7  HCT 53.5* 50.0* 53.6* 38.3 43.2  MCV 75.7*  --  77.0* 77.1* 76.9*  PLT 475*  --  409* 230 154   Basic Metabolic Panel: Recent Labs  Lab 10/01/19 1729 10/01/19 1801 10/02/19 0518 10/03/19 2350 10/05/19 0358  NA 145 148* 150* 142 142  K 5.2* 4.9 4.6 3.8 4.4  CL 116* 117* 122* 114* 111  CO2 18*  --  19* 20* 25  GLUCOSE 142* 142* 140* 99 92  BUN 52* 61* 37* 9 6*  CREATININE 1.18* 1.30* 0.93 0.59 0.58  CALCIUM 9.5  --  9.3 8.3* 8.6*    MG  --   --   --  2.0 1.9  PHOS  --   --   --  1.9*  --    GFR: Estimated Creatinine Clearance: 65.6 mL/min (by C-G formula based on SCr of 0.58 mg/dL). Liver Function Tests: Recent Labs  Lab 10/01/19 1729 10/02/19 0518 10/03/19 2350 10/05/19 0358  AST 379* 362* 146* 127*  ALT 153* 159* 89* 89*  ALKPHOS 71 67 47 47  BILITOT 0.8 0.8 <0.1* 0.6  PROT 8.8* 7.3 5.6* 5.4*  ALBUMIN 3.7 3.0* 2.3* 2.2*   No results for input(s): LIPASE, AMYLASE in the last 168 hours. Recent Labs  Lab 10/02/19 0055  AMMONIA 24   Coagulation Profile: Recent Labs  Lab 10/01/19 1729  INR 1.1   Cardiac Enzymes: Recent Labs  Lab 10/01/19 2234 10/03/19 0315 10/03/19 2350 10/05/19 0358  CKTOTAL 24,248* 5,699* 4,362* 3,355*   BNP (last 3 results) No results for input(s): PROBNP in the last 8760 hours. HbA1C: No results for input(s): HGBA1C in the last 72 hours. CBG: Recent Labs  Lab 10/01/19 1714  GLUCAP 150*   Lipid Profile: No results for input(s): CHOL, HDL, LDLCALC, TRIG, CHOLHDL, LDLDIRECT in the last 72 hours. Thyroid Function Tests: Recent Labs    10/03/19 1454  FREET4 1.05   Anemia Panel: No results for input(s): VITAMINB12, FOLATE, FERRITIN, TIBC, IRON, RETICCTPCT in the last 72 hours. Sepsis Labs: No results for input(s): PROCALCITON, LATICACIDVEN in the last 168 hours.  Recent Results (from the past 240 hour(s))  SARS Coronavirus 2 by RT PCR (hospital order, performed in [facility] hospital lab) Nasopharyngeal Nasopharyngeal Swab     Status: None   Collection Time: 10/01/19  5:51 PM   Specimen: Nasopharyngeal Swab  Result Value Ref Range Status   SARS Coronavirus 2 NEGATIVE NEGATIVE Final    Comment: (NOTE) SARS-CoV-2 target nucleic acids are NOT DETECTED. The SARS-CoV-2 RNA is generally detectable in upper and lower respiratory specimens during the acute phase of infection. The lowest concentration of SARS-CoV-2 viral copies this assay can detect is 250 copies /  mL. A negative result does not preclude SARS-CoV-2  infection and should not be used as the sole basis for treatment or other patient management decisions.  A negative result may occur with improper specimen collection / handling, submission of specimen other than nasopharyngeal swab, presence of viral mutation(s) within the areas targeted by this assay, and inadequate number of viral copies (<250 copies / mL). A negative result must be combined with clinical observations, patient history, and epidemiological information. Fact Sheet for Patients:   StrictlyIdeas.no Fact Sheet for Healthcare Providers: BankingDealers.co.za This test is not yet approved or cleared  by the [place] FDA and has been authorized for detection and/or diagnosis of SARS-CoV-2 by FDA under an Emergency Use Authorization (EUA).  This EUA will remain in effect (meaning this test can be used) for the duration of the COVID-19 declaration under Section 564(b)(1) of the Act, 21 U.S.C. section 360bbb-3(b)(1), unless the authorization is terminated or revoked sooner. Performed at [facility], [place] [address], [place], [place] [address]   Culture, blood (routine x 2)     Status: Abnormal   Collection Time: 10/02/19  1:15 AM   Specimen: BLOOD  Result Value Ref Range Status   Specimen Description BLOOD RIGHT ARM  Final   Special Requests   Final    BOTTLES DRAWN AEROBIC AND ANAEROBIC Blood Culture results may not be optimal due to an inadequate volume of blood received in culture bottles   Culture  Setup Time   Final    GRAM POSITIVE COCCI IN CLUSTERS IN BOTH AEROBIC AND ANAEROBIC BOTTLES CRITICAL VALUE NOTED.  VALUE IS CONSISTENT WITH PREVIOUSLY REPORTED AND CALLED VALUE.    Culture (A)  Final    STAPHYLOCOCCUS HOMINIS SUSCEPTIBILITIES PERFORMED ON PREVIOUS CULTURE WITHIN THE LAST 5 DAYS. Performed at [facility], [place] [address], [place], [place]  [address]    Report Status 10/04/2019 FINAL  Final  Culture, blood (routine x 2)     Status: Abnormal   Collection Time: 10/02/19  1:15 AM   Specimen: BLOOD  Result Value Ref Range Status   Specimen Description BLOOD LEFT ARM  Final   Special Requests   Final    BOTTLES DRAWN AEROBIC AND ANAEROBIC Blood Culture results may not be optimal due to an inadequate volume of blood received in culture bottles   Culture  Setup Time   Final    GRAM POSITIVE COCCI IN CLUSTERS AEROBIC BOTTLE ONLY CRITICAL RESULT CALLED TO, READ BACK BY AND VERIFIED WITH: PHARMD A MEYER AT 2041 10/02/19 BY L BENFIELD Performed at [facility], [place] [address] [place],  [address]    Culture STAPHYLOCOCCUS HOMINIS (A)  Final   Report Status 10/04/2019 FINAL  Final   Organism ID, Bacteria STAPHYLOCOCCUS HOMINIS  Final      Susceptibility   Staphylococcus hominis - MIC*    CIPROFLOXACIN <=0.5 SENSITIVE Sensitive     ERYTHROMYCIN >=8 RESISTANT Resistant     GENTAMICIN <=0.5 SENSITIVE Sensitive     OXACILLIN >=4 RESISTANT Resistant     TETRACYCLINE 4 SENSITIVE Sensitive     VANCOMYCIN <=0.5 SENSITIVE Sensitive     TRIMETH/SULFA 80 RESISTANT Resistant     CLINDAMYCIN <=0.25 SENSITIVE Sensitive     RIFAMPIN <=0.5 SENSITIVE Sensitive     Inducible Clindamycin NEGATIVE Sensitive     * STAPHYLOCOCCUS HOMINIS  Blood Culture ID Panel (Reflexed)     Status: Abnormal   Collection Time: 10/02/19  1:15 AM  Result Value Ref Range Status   Enterococcus species NOT  DETECTED NOT DETECTED Final   Listeria monocytogenes NOT DETECTED NOT DETECTED Final   Staphylococcus species DETECTED (A) NOT DETECTED Final    Comment: Methicillin (oxacillin) resistant coagulase negative staphylococcus. Possible blood culture contaminant (unless isolated from more than one blood culture draw or clinical case suggests pathogenicity). No antibiotic treatment is indicated for blood  culture contaminants. CRITICAL RESULT CALLED TO, READ BACK  BY AND VERIFIED WITH: PHARMD A MEYER AT 2041 10/02/19 BY L BENFIELD    Staphylococcus aureus (BCID) NOT DETECTED NOT DETECTED Final   Methicillin resistance DETECTED (A) NOT DETECTED Final    Comment: CRITICAL RESULT CALLED TO, READ BACK BY AND VERIFIED WITH: PHARMD A MEYER AT 2041 10/02/19 BY L BENFIELD    Streptococcus species NOT DETECTED NOT DETECTED Final   Streptococcus agalactiae NOT DETECTED NOT DETECTED Final   Streptococcus pneumoniae NOT DETECTED NOT DETECTED Final   Streptococcus pyogenes NOT DETECTED NOT DETECTED Final   Acinetobacter baumannii NOT DETECTED NOT DETECTED Final   Enterobacteriaceae species NOT DETECTED NOT DETECTED Final   Enterobacter cloacae complex NOT DETECTED NOT DETECTED Final   Escherichia coli NOT DETECTED NOT DETECTED Final   Klebsiella oxytoca NOT DETECTED NOT DETECTED Final   Klebsiella pneumoniae NOT DETECTED NOT DETECTED Final   Proteus species NOT DETECTED NOT DETECTED Final   Serratia marcescens NOT DETECTED NOT DETECTED Final   Haemophilus influenzae NOT DETECTED NOT DETECTED Final   Neisseria meningitidis NOT DETECTED NOT DETECTED Final   Pseudomonas aeruginosa NOT DETECTED NOT DETECTED Final   Candida albicans NOT DETECTED NOT DETECTED Final   Candida glabrata NOT DETECTED NOT DETECTED Final   Candida krusei NOT DETECTED NOT DETECTED Final   Candida parapsilosis NOT DETECTED NOT DETECTED Final   Candida tropicalis NOT DETECTED NOT DETECTED Final    Comment: Performed at [facility], [place]. [address] [place], [place] [address]  CSF culture     Status: None   Collection Time: 10/02/19  2:46 AM   Specimen: CSF; Cerebrospinal Fluid  Result Value Ref Range Status   Specimen Description CSF  Final   Special Requests NONE  Final   Gram Stain   Final    CYTOSPIN SMEAR WBC PRESENT,BOTH PMN AND MONONUCLEAR NO ORGANISMS SEEN    Culture   Final    NO GROWTH 3 DAYS Performed at [facility], [place] [address] [place], [place]  [address]    Report Status 10/05/2019 FINAL  Final  Culture, fungus without smear     Status: None (Preliminary result)   Collection Time: 10/02/19  2:47 AM   Specimen: CSF; Cerebrospinal Fluid  Result Value Ref Range Status   Specimen Description CSF  Final   Special Requests NONE  Final   Culture   Final    NO GROWTH 3 DAYS Performed at [facility], [place] [address] [place], [place] [address]    Report Status PENDING  Incomplete  Culture, Urine     Status: Abnormal   Collection Time: 10/02/19  1:00 PM   Specimen: Urine, Random  Result Value Ref Range Status   Specimen Description URINE, RANDOM  Final   Special Requests   Final    NONE Performed at [facility], [place] [address] [place], [place] [address]    Culture >=100,000 COLONIES/mL ESCHERICHIA COLI (A)  Final   Report Status 10/04/2019 FINAL  Final   Organism ID, Bacteria ESCHERICHIA COLI (A)  Final      Susceptibility  Escherichia coli - MIC*    AMPICILLIN <=2 SENSITIVE Sensitive     CEFAZOLIN <=4 SENSITIVE Sensitive     CEFTRIAXONE <=1 SENSITIVE Sensitive     CIPROFLOXACIN <=0.25 SENSITIVE Sensitive     GENTAMICIN <=1 SENSITIVE Sensitive     IMIPENEM <=0.25 SENSITIVE Sensitive     NITROFURANTOIN <=16 SENSITIVE Sensitive     TRIMETH/SULFA <=20 SENSITIVE Sensitive     AMPICILLIN/SULBACTAM <=2 SENSITIVE Sensitive     PIP/TAZO <=4 SENSITIVE Sensitive     * >=100,000 COLONIES/mL ESCHERICHIA COLI  Culture, blood (routine x 2)     Status: None (Preliminary result)   Collection Time: 10/04/19 12:01 AM   Specimen: BLOOD  Result Value Ref Range Status   Specimen Description BLOOD LEFT ARM  Final   Special Requests   Final    BOTTLES DRAWN AEROBIC ONLY Blood Culture adequate volume   Culture   Final    NO GROWTH 1 DAY Performed at [facility], [place] [address] [place], [place] [address]    Report Status PENDING  Incomplete  Culture, blood (routine x 2)     Status: None (Preliminary result)   Collection  Time: 10/04/19 12:04 AM   Specimen: BLOOD  Result Value Ref Range Status   Specimen Description BLOOD RIGHT ARM  Final   Special Requests   Final    BOTTLES DRAWN AEROBIC ONLY Blood Culture results may not be optimal due to an inadequate volume of blood received in culture bottles   Culture   Final    NO GROWTH 1 DAY Performed at [facility], [place] [address] [place], [place] [address]    Report Status PENDING  Incomplete         Radiology Studies: CT HUMERUS RIGHT W CONTRAST  Result Date: 10/04/2019 CLINICAL DATA:  Right arm swelling and pain EXAM: CT OF THE UPPER RIGHT EXTREMITY WITH CONTRAST TECHNIQUE: Multidetector CT imaging of the upper right extremity was performed according to the standard protocol following intravenous contrast administration. COMPARISON:  None. CONTRAST:  151mL OMNIPAQUE IOHEXOL 300 MG/ML  SOLN FINDINGS: Bones/Joint/Cartilage No fracture or dislocation. Normal alignment. No joint effusion. Ligaments Ligaments are suboptimally evaluated by CT. Muscles and Tendons Muscles are normal.  No intramuscular fluid collection or hematoma. Soft tissue No fluid collection or hematoma. No soft tissue mass. Circumferential soft tissue edema and mild skin thickening involving the right upper arm concerning for cellulitis. No drainable fluid collection. IMPRESSION: Circumferential soft tissue edema and mild skin thickening involving the right upper arm concerning for cellulitis. No drainable fluid collection to suggest an abscess. Electronically Signed   By: Kathreen Devoid   On: 10/04/2019 12:18   ECHOCARDIOGRAM COMPLETE  Result Date: 10/04/2019    ECHOCARDIOGRAM REPORT   Patient Name:   Karen Best Date of Exam: 10/04/2019 Medical Rec #:  [id number]         Height:       65.0 in Accession #:    [id number]        Weight:       148.8 lb Date of Birth:  November 10, 1955         BSA:          1.745 m Patient Age:    32 years          BP:           120/60 mmHg Patient Gender: F  HR:           71 bpm. Exam Location:  Inpatient Procedure: 2D Echo, Color Doppler and Cardiac Doppler Indications:    Stroke i163.9  History:        Patient has no prior history of Echocardiogram examinations.  Sonographer:    Raquel Sarna Senior RDCS Referring Phys: [id number] [place] T TU  Sonographer Comments: Respiratory windows. IMPRESSIONS  1. Left ventricular ejection fraction, by estimation, is 65 to 70%. The left ventricle has normal function. The left ventricle has no regional wall motion abnormalities. There is mild asymmetric left ventricular hypertrophy of the basal-septal segment. Left ventricular diastolic parameters were normal.  2. Right ventricular systolic function was not well visualized. The right ventricular size is normal. There is normal pulmonary artery systolic pressure. The estimated right ventricular systolic pressure is 45.4 mmHg.  3. The mitral valve is normal in structure. No evidence of mitral valve regurgitation.  4. The aortic valve was not well visualized. Aortic valve regurgitation is not visualized. No aortic stenosis is present.  5. The inferior vena cava is normal in size with greater than 50% respiratory variability, suggesting right atrial pressure of 3 mmHg.  6. No vegetation seen, though technically difficult study. If clinical suspicion for endocarditis, consider TEE FINDINGS  Left Ventricle: Left ventricular ejection fraction, by estimation, is 65 to 70%. The left ventricle has normal function. The left ventricle has no regional wall motion abnormalities. The left ventricular internal cavity size was normal in size. There is  mild asymmetric left ventricular hypertrophy of the basal-septal segment. Left ventricular diastolic parameters were normal. Right Ventricle: The right ventricular size is normal. Right vetricular wall thickness was not assessed. Right ventricular systolic function was not well visualized. There is normal pulmonary artery systolic pressure. The tricuspid  regurgitant velocity is 2.49 m/s, and with an assumed right atrial pressure of 3 mmHg, the estimated right ventricular systolic pressure is 09.8 mmHg. Left Atrium: Left atrial size was normal in size. Right Atrium: Right atrial size was normal in size. Pericardium: There is no evidence of pericardial effusion. Mitral Valve: The mitral valve is normal in structure. No evidence of mitral valve regurgitation. Tricuspid Valve: The tricuspid valve is normal in structure. Tricuspid valve regurgitation is trivial. Aortic Valve: The aortic valve was not well visualized. Aortic valve regurgitation is not visualized. No aortic stenosis is present. Pulmonic Valve: The pulmonic valve was not well visualized. Pulmonic valve regurgitation is not visualized. Aorta: The aortic root and ascending aorta are structurally normal, with no evidence of dilitation. Venous: The inferior vena cava is normal in size with greater than 50% respiratory variability, suggesting right atrial pressure of 3 mmHg. IAS/Shunts: The interatrial septum was not well visualized.  LEFT VENTRICLE PLAX 2D LVIDd:         3.80 cm  Diastology LVIDs:         2.50 cm  LV e' lateral:   9.68 cm/s LV PW:         0.90 cm  LV E/e' lateral: 7.3 LV IVS:        1.30 cm  LV e' medial:    7.83 cm/s LVOT diam:     1.90 cm  LV E/e' medial:  9.0 LV SV:         82 LV SV Index:   47 LVOT Area:     2.84 cm  RIGHT VENTRICLE RV S prime:     14.00 cm/s TAPSE (M-mode): 2.5 cm LEFT ATRIUM  Index       RIGHT ATRIUM           Index LA diam:        2.30 cm 1.32 cm/m  RA Area:     10.90 cm LA Vol (A2C):   31.6 ml 18.11 ml/m RA Volume:   22.30 ml  12.78 ml/m LA Vol (A4C):   20.4 ml 11.69 ml/m LA Biplane Vol: 26.6 ml 15.25 ml/m  AORTIC VALVE LVOT Vmax:   133.00 cm/s LVOT Vmean:  89.100 cm/s LVOT VTI:    0.288 m  AORTA Ao Root diam: 3.10 cm Ao Asc diam:  3.10 cm MITRAL VALVE               TRICUSPID VALVE MV Area (PHT): 2.76 cm    TR Peak grad:   24.8 mmHg MV Decel Time: 275  msec    TR Vmax:        249.00 cm/s MV E velocity: 70.40 cm/s MV A velocity: 82.50 cm/s  SHUNTS MV E/A ratio:  0.85        Systemic VTI:  0.29 m                            Systemic Diam: 1.90 cm Oswaldo Milian MD Electronically signed by Oswaldo Milian MD Signature Date/Time: 10/04/2019/12:42:48 PM    Final         Scheduled Meds: . aspirin  324 mg Oral Daily  . [START ON 10/06/2019] doxycycline  100 mg Oral Q12H  . enoxaparin (LOVENOX) injection  40 mg Subcutaneous QHS  . lithium citrate  198 mg Oral TID  . OLANZapine  10 mg Oral Daily  . OLANZapine  20 mg Oral QHS  . Valbenazine Tosylate  1 tablet Oral QHS   Continuous Infusions: . dextrose 5 % and 0.45% NaCl 100 mL/hr at 10/03/19 2253  . vancomycin 1,000 mg (10/05/19 0319)     LOS: 4 days    Time spent: 25 minutes    Barb Merino, MD [facility] Pager [phone]

## 2019-10-05 NOTE — Progress Notes (Signed)
Occupational Therapy Treatment Patient Details Name: Karen Best MRN: [id number] DOB: [birth year] Today's Date: 10/05/2019    History of present illness Pt is a 64 y/o female admitted secondary to toxic metabolic encephalopathy. Also found to have L subinsular white matter infarct. PMH includes schizophrenia, bipolar disorder, CVA.    OT comments  Pt seen in conjunction with PT for functional mobility progression. Pt with improved level of arousal and participation in session in comparison to pervious OT session.  Overall, pt requires MOD- MIN A +2 for sit<>stand from EOB and recliner with HHA. Pt able to complete stand pivot transfer to recliner with MOD A+2 with HHA. Pt continues to present with generalized weakness, decreased activity tolerance, and edema affecting pts RUE limiting AROM. Performed PROM to RUE and elevated extremity to assist with edema mgmt. Continue to recommend DC plan listed below, will follow acutely per POC.      Follow Up Recommendations  SNF;Supervision/Assistance - 24 hour    Equipment Recommendations  Other (comment)(TBD at next venue of care)    Recommendations for Other Services      Precautions / Restrictions Precautions Precautions: Fall Restrictions Weight Bearing Restrictions: No       Mobility Bed Mobility Overal bed mobility: Needs Assistance Bed Mobility: Supine to Sit     Supine to sit: Max assist;+2 for physical assistance;HOB elevated     General bed mobility comments: pt able to initiate advancing BLEs to EOB but ultimately required MAX A +2 to maneuver BLEs and elevate trunk into sitting; cues to sequence all aspects of bed mobility  Transfers Overall transfer level: Needs assistance Equipment used: 2 person hand held assist Transfers: Sit to/from [facility] Sit to Stand: Mod assist;+2 physical assistance;Min assist Stand pivot transfers: Mod assist;+2 physical assistance       General transfer comment: pt  able to sit<>stand from EOB with MOD A +2 but progressing to MIN A +2 from recliner; pt required manual facilitation to advance RLE during pivot needing    Balance Overall balance assessment: Needs assistance Sitting-balance support: Feet supported Sitting balance-Leahy Scale: Fair Sitting balance - Comments: close supervision while sitting EOB   Standing balance support: Bilateral upper extremity supported Standing balance-Leahy Scale: Poor Standing balance comment: reliant on external support                           ADL either performed or assessed with clinical judgement   ADL Overall ADL's : Needs assistance/impaired                         Toilet Transfer: Moderate assistance;Minimal assistance;+2 for safety/equipment;+2 for physical assistance;Stand-pivot Toilet Transfer Details (indicate cue type and reason): simulated via functional mobility to recliner; MAX A +2 to initially progressing to MOD A +2 for stability and assist to manage RUE         Functional mobility during ADLs: Moderate assistance;+2 for physical assistance General ADL Comments: pt with improvements in level of arousal from last session as pt able to complete stand pivot transfer with  MOD A +2. Pt continues to be limited by decreased ROM in RUE and decreased activity tolerance     Vision       Perception     Praxis      Cognition Arousal/Alertness: Awake/alert Behavior During Therapy: WFL for tasks assessed/performed Overall Cognitive Status: Within Functional Limits for tasks assessed  General Comments: overall WFL for simple tasks        Exercises General Exercises - Upper Extremity Elbow Flexion: PROM;Right;5 reps;Seated Elbow Extension: PROM;Right;5 reps;Seated Wrist Flexion: PROM;Right;5 reps;Seated Wrist Extension: PROM;5 reps;Right;Seated Digit Composite Flexion: PROM;Right;5 reps;Seated Composite Extension:  PROM;Right;5 reps;Seated   Shoulder Instructions       General Comments no family present during session; ntoed edema in RUE; elevated RUE on pillow with pt positioned in chair    Pertinent Vitals/ Pain       Pain Assessment: Faces Faces Pain Scale: Hurts little more Pain Location: RUE with movement Pain Descriptors / Indicators: Discomfort;Grimacing Pain Intervention(s): Limited activity within patient's tolerance;Monitored during session;Repositioned  Home Living                                          Prior Functioning/Environment              Frequency  Min 2X/week        Progress Toward Goals  OT Goals(current goals can now be found in the care plan section)  Progress towards OT goals: Progressing toward goals  Acute Rehab OT Goals Patient Stated Goal: less pain in my arm OT Goal Formulation: With patient Time For Goal Achievement: 10/17/19 Potential to Achieve Goals: [place] Discharge plan remains appropriate    Co-evaluation    PT/OT/SLP Co-Evaluation/Treatment: Yes Reason for Co-Treatment: For patient/therapist safety;To address functional/ADL transfers   OT goals addressed during session: ADL's and self-care      AM-PAC OT "6 Clicks" Daily Activity     Outcome Measure   Help from another person eating meals?: A Little Help from another person taking care of personal grooming?: A Little Help from another person toileting, which includes using toliet, bedpan, or urinal?: A Lot Help from another person bathing (including washing, rinsing, drying)?: A Lot Help from another person to put on and taking off regular upper body clothing?: A Little Help from another person to put on and taking off regular lower body clothing?: A Lot 6 Click Score: 15    End of Session Equipment Utilized During Treatment: Gait belt  OT Visit Diagnosis: Other abnormalities of gait and mobility (R26.89);Muscle weakness (generalized) (M62.81);Other  symptoms and signs involving cognitive function   Activity Tolerance Patient tolerated treatment well   Patient Left in chair;with call bell/phone within reach;with chair alarm set   Nurse Communication Mobility status;Other (comment)(use of stedy and +2 for back to bed)        Time: 2229-7989 OT Time Calculation (min): 32 min  Charges: OT General Charges $OT Visit: 1 Visit OT Treatments $Therapeutic Activity: 8-22 mins  Lanier Clam., COTA/L Acute Rehabilitation Services [phone]    Ihor Gully 10/05/2019, 1:10 PM

## 2019-10-05 NOTE — NC FL2 (Signed)
[place] LEVEL OF CARE SCREENING TOOL     IDENTIFICATION  Patient Name: Karen Best Birthdate: 1955-05-10 Sex: female Admission Date (Current Location): 10/01/2019  [facility] and [place] Number:  Herbalist and Address:  [facility] [place], [place] [address] [place], [place] [address]      Provider Number: [id number]  Attending Physician Name and Address:  Barb Merino, MD  Relative Name and Phone Number:       Current Level of Care: Hospital Recommended Level of Care: [place] Prior Approval Number:    Date Approved/Denied:   PASRR Number: under review  Discharge Plan: SNF    Current Diagnoses: Patient Active Problem List   Diagnosis Date Noted  . Bacteremia due to Staphylococcus aureus 10/03/2019  . Dehydration with hypernatremia 10/03/2019  . AKI (acute kidney injury) ([place]) 10/02/2019  . Transaminitis 10/02/2019  . CVA (cerebral vascular accident) ([place]) 10/02/2019  . AMS (altered mental status) 10/01/2019  . Lithium toxicity 02/16/2019  . Bipolar 1 disorder ([place]) 02/16/2019  . Hyperkalemia 02/16/2019  . Acute confusion 12/29/2018  . Toxic metabolic encephalopathy 82/42/3536  . Dysarthria 12/23/2018  . Elevated blood pressure reading 12/23/2018  . Asthma 12/23/2018  . Delirium due to another medical condition   . UTI (urinary tract infection) 12/06/2018  . Acute metabolic encephalopathy 14/43/1540  . 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 Psychosis ([place]) 09/16/2013  . Schizophrenia ([place])     Orientation RESPIRATION BLADDER Height & Weight     Self, Time, Situation, Place  Normal Incontinent Weight: 67.5 kg Height:  5\' 5"  (165.1 cm)  BEHAVIORAL SYMPTOMS/MOOD NEUROLOGICAL BOWEL NUTRITION STATUS      Continent Diet(dysphagia 1 diet with thin liquids)  AMBULATORY STATUS COMMUNICATION OF NEEDS Skin   Extensive Assist Verbally (swelling and blistering to rt arm and hand)                        Personal Care Assistance Level of Assistance  Bathing, Feeding, Dressing Bathing Assistance: Limited assistance Feeding assistance: Limited assistance Dressing Assistance: Maximum assistance     Functional Limitations Info  Sight, Hearing, Speech Sight Info: Adequate Hearing Info: Adequate Speech Info: Adequate    SPECIAL CARE FACTORS FREQUENCY  PT (By licensed PT), OT (By licensed OT), Speech therapy     PT Frequency: 5x/wk OT Frequency: 5x/wk     Speech Therapy Frequency: 5x/wk      Contractures Contractures Info: Not present    Additional Factors Info  Code Status, Allergies, Psychotropic Code Status Info: Full Allergies Info: NKA Psychotropic Info: Lithium 198mg  TID/ Zyprexa 10 mg daily and 20 mg at bedtime         Current Medications (10/05/2019):  This is the current hospital active medication list Current Facility-Administered Medications  Medication Dose Route Frequency Provider Last Rate Last Admin  . aspirin chewable tablet 324 mg  324 mg Oral Daily Tu, Ching T, DO   324 mg at 10/05/19 0933  . dextrose 5 %-0.45 % sodium chloride infusion   Intravenous Continuous Barb Merino, MD 100 mL/hr at 10/03/19 2253 Rate Verify at 10/03/19 2253  . [START ON 10/06/2019] doxycycline (VIBRA-TABS) tablet 100 mg  100 mg Oral Q12H Comer, Okey Regal, MD      . enoxaparin (LOVENOX) injection 40 mg  40 mg Subcutaneous QHS Tu, Ching T, DO   40 mg at 10/04/19 2157  . lithium citrate 300 MG/5ML solution 198 mg  198 mg Oral TID Lovey Newcomer T, NP   198 mg at 10/05/19 0933  . OLANZapine (ZYPREXA) tablet 10 mg  10 mg Oral Daily Jennye Boroughs, MD   10 mg at 10/05/19 0933  . OLANZapine (ZYPREXA) tablet 20 mg  20 mg Oral QHS Jennye Boroughs, MD   20 mg at 10/04/19 2203  . Valbenazine Tosylate CAPS 1 tablet  1 tablet Oral QHS Jennye Boroughs, MD      . vancomycin (VANCOCIN) IVPB 1000 mg/200 mL premix  1,000 mg Intravenous Q24H Thayer Headings, MD 200 mL/hr at 10/05/19 0319 1,000 mg  at 10/05/19 0319     Discharge Medications: Please see discharge summary for a list of discharge medications.  Relevant Imaging Results:  Relevant Lab Results:   Additional Information SS#: [id number]  Pollie Friar, RN

## 2019-10-05 NOTE — TOC Initial Note (Signed)
Transition of Care [facility]) - Initial/Assessment Note    Patient Details  Name: Karen Best MRN: [id number] Date of Birth: August 20, 1955  Transition of [facility]) CM/SW Contact:    Pollie Friar, RN Phone Number: 10/05/2019, 12:24 PM  Clinical Narrative:                 CM met with the patient to discuss therapy recommendations. She is agreeable to SNF rehab. She asked to be faxed out in the [facility] area.  PASAR under review and CM will fax in the needed information. TOC following.  Expected Discharge Plan: Skilled Nursing Facility Barriers to Discharge: Continued Medical Work up   Patient Goals and CMS Choice   CMS Medicare.gov Compare Post Acute Care list provided to:: Patient Choice offered to / list presented to : Patient  Expected Discharge Plan and Services Expected Discharge Plan: [place]   Discharge Planning Services: CM Consult Post Acute Care Choice: [place] Living arrangements for the past 2 months: Apartment                                      Prior Living Arrangements/Services Living arrangements for the past 2 months: Apartment Lives with:: Self Patient language and need for interpreter reviewed:: Yes Do you feel safe going back to the place where you live?: No   unable to care for self currently  Need for Family Participation in Patient Care: Yes (Comment) Care giver support system in place?: No (comment)   Criminal Activity/Legal Involvement Pertinent to Current Situation/Hospitalization: No - Comment as needed  Activities of Daily Living      Permission Sought/Granted                  Emotional Assessment Appearance:: Appears stated age Attitude/Demeanor/Rapport: Engaged Affect (typically observed): Accepting Orientation: : Oriented to Self, Oriented to Place, Oriented to  Time, Oriented to Situation   Psych Involvement: No (comment)  Admission diagnosis:  Aphasia [R47.01] Weakness  [R53.1] AKI (acute kidney injury) ([place]) [N17.9] AMS (altered mental status) [R41.82] Patient Active Problem List   Diagnosis Date Noted  . Bacteremia due to Staphylococcus aureus 10/03/2019  . Dehydration with hypernatremia 10/03/2019  . AKI (acute kidney injury) ([place]) 10/02/2019  . Transaminitis 10/02/2019  . CVA (cerebral vascular accident) ([place]) 10/02/2019  . AMS (altered mental status) 10/01/2019  . Lithium toxicity 02/16/2019  . Bipolar 1 disorder ([place]) 02/16/2019  . Hyperkalemia 02/16/2019  . Acute confusion 12/29/2018  . Toxic metabolic encephalopathy 43/15/4008  . Dysarthria 12/23/2018  . Elevated blood pressure reading 12/23/2018  . Asthma 12/23/2018  . Delirium due to another medical condition   . UTI (urinary tract infection) 12/06/2018  . Acute metabolic encephalopathy 67/61/9509  . 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 Psychosis ([place]) 09/16/2013  . Schizophrenia [facility])    PCP:  [facility]:   [place], [address] [place] [address] Phone: [phone] Fax: [phone]     Social Determinants of Health (SDOH) Interventions    Readmission Risk Interventions No flowsheet data found.

## 2019-10-05 NOTE — Progress Notes (Signed)
Physical Therapy Treatment Patient Details Name: Karen Best MRN: [id number] DOB: 1955-06-04 Today's Date: 10/05/2019    History of Present Illness Pt is a 64 y/o female admitted secondary to toxic metabolic encephalopathy. Also found to have L subinsular white matter infarct. PMH includes schizophrenia, bipolar disorder, CVA.     PT Comments    Patient is making progress toward PT goals and requires +2 assist for functional transfer training. Pt presents with R side weakness and painful R UE/LE (R UE very painful and edematous).  Continue to progress as tolerated with anticipated d/c to SNF for further skilled PT services.    Follow Up Recommendations  SNF;Supervision/Assistance - 24 hour     Equipment Recommendations  Other (comment)(TBD)    Recommendations for Other Services       Precautions / Restrictions Precautions Precautions: Fall Restrictions Weight Bearing Restrictions: No    Mobility  Bed Mobility Overal bed mobility: Needs Assistance Bed Mobility: Supine to Sit     Supine to sit: Max assist;+2 for physical assistance;HOB elevated     General bed mobility comments: pt able to initiate advancing BLEs to EOB but ultimately required MAX A +2 to maneuver BLEs and elevate trunk into sitting; cues to sequence all aspects of bed mobility  Transfers Overall transfer level: Needs assistance Equipment used: 2 person hand held assist Transfers: Sit to/from [facility] Sit to Stand: Mod assist;+2 physical assistance;Min assist Stand pivot transfers: Mod assist;+2 physical assistance       General transfer comment: pt stood X 3 trials; +2 assist to power up into standing from EOB and recliner; difficulty advancing R LE when pivoting and requires assist to for weight shifting, balance, and physical assist to pivot R foot  Ambulation/Gait                 Stairs             Wheelchair Mobility    Modified Rankin (Stroke Patients  Only)       Balance Overall balance assessment: Needs assistance Sitting-balance support: Feet supported Sitting balance-Leahy Scale: Fair Sitting balance - Comments: close supervision while sitting EOB   Standing balance support: Bilateral upper extremity supported Standing balance-Leahy Scale: Poor Standing balance comment: reliant on external support                            Cognition Arousal/Alertness: Awake/alert Behavior During Therapy: WFL for tasks assessed/performed Overall Cognitive Status: Within Functional Limits for tasks assessed                                 General Comments: overall WFL for simple tasks      Exercises General Exercises - Upper Extremity Elbow Flexion: PROM;Right;5 reps;Seated Elbow Extension: PROM;Right;5 reps;Seated Wrist Flexion: PROM;Right;5 reps;Seated Wrist Extension: PROM;5 reps;Right;Seated Digit Composite Flexion: PROM;Right;5 reps;Seated Composite Extension: PROM;Right;5 reps;Seated    General Comments General comments (skin integrity, edema, etc.): no family present during session; ntoed edema in RUE; elevated RUE on pillow with pt positioned in chair      Pertinent Vitals/Pain Pain Assessment: Faces Faces Pain Scale: Hurts little more Pain Location: RUE with movement Pain Descriptors / Indicators: Discomfort;Grimacing Pain Intervention(s): Monitored during session;Limited activity within patient's tolerance;Repositioned    Home Living  Prior Function            PT Goals (current goals can now be found in the care plan section) Acute Rehab PT Goals Patient Stated Goal: less pain in my arm Progress towards PT goals: Progressing toward goals    Frequency    Min 2X/week      PT Plan Current plan remains appropriate    Co-evaluation PT/OT/SLP Co-Evaluation/Treatment: Yes Reason for Co-Treatment: For patient/therapist safety;To address functional/ADL  transfers PT goals addressed during session: Mobility/safety with mobility;Balance OT goals addressed during session: ADL's and self-care      AM-PAC PT "6 Clicks" Mobility   Outcome Measure  Help needed turning from your back to your side while in a flat bed without using bedrails?: A Lot Help needed moving from lying on your back to sitting on the side of a flat bed without using bedrails?: A Lot Help needed moving to and from a bed to a chair (including a wheelchair)?: A Lot Help needed standing up from a chair using your arms (e.g., wheelchair or bedside chair)?: A Lot Help needed to walk in hospital room?: Total Help needed climbing 3-5 steps with a railing? : Total 6 Click Score: 10    End of Session Equipment Utilized During Treatment: Gait belt Activity Tolerance: Patient tolerated treatment well Patient left: with call bell/phone within reach;in chair;with chair alarm set Nurse Communication: Mobility status PT Visit Diagnosis: Unsteadiness on feet (R26.81);Muscle weakness (generalized) (M62.81);Difficulty in walking, not elsewhere classified (R26.2)     Time: 8887-5797 PT Time Calculation (min) (ACUTE ONLY): 33 min  Charges:  $Gait Training: 8-22 mins                     Earney Navy, PTA Acute Rehabilitation Services Pager: [phone] Office: [phone]     Darliss Cheney 10/05/2019, 1:34 PM

## 2019-10-05 NOTE — Progress Notes (Addendum)
  Speech Language Pathology Treatment: Dysphagia  Patient Details Name: Karen Best MRN: [id number] DOB: 1955/11/07 Today's Date: 10/05/2019 Time: 0923-3007 SLP Time Calculation (min) (ACUTE ONLY): 16 min  Assessment / Plan / Recommendation Clinical Impression  Pt was seen for dysphagia treatment. She was alert and cooperative throughout the session. Nursing denied observance of any signs of aspiration with p.o. intake. Pt stated that she does not prefer the consistency, but she denied difficulty. No signs or symptoms of aspiration were noted with any trials, and the oral phase of her swallow appears improved compared to the evaluation. Mastication time was [facility] for dysphagia 3 solids and masticatory pattern was WNL. Mastication was prolonged with regular texture solids, possibly due to edentulous status. It is recommended that the pt's diet be upgraded to dysphagia 3 solids and thin liquids at this time. SLP will continue to follow pt.    HPI HPI: Karen Best is a 64 y.o. female with medical history significant for medical history significant of bipolar 1 disorder, schizophrenia, asthma, fibromyalgia, dementia, and tobacco abuse who presents with altered mental status.  MRI was remarkable for a acute infarct within the posterior left subinsular white matter.       SLP Plan  Goals updated       Recommendations  Diet recommendations: Dysphagia 3 (mechanical soft);Thin liquid Liquids provided via: Cup;Straw Medication Administration: Crushed with puree Compensations: Minimize environmental distractions;Slow rate;Small sips/bites                Oral Care Recommendations: Oral care BID Follow up Recommendations: 24 hour supervision/assistance SLP Visit Diagnosis: Dysphagia, oral phase (R13.11) Plan: Goals updated       Naszir Cott I. Hardin Negus, [place], [place] Office number [phone] Pager [place] 10/05/2019, 12:31 PM

## 2019-10-05 NOTE — Progress Notes (Signed)
Karen Best [birth year] 10/05/2019  To Whom It May Concern:  Please be advised that the above-named patient will require a short-term nursing home stay-anticipated 30 days or less for rehabilitation and strengthening. The plan is for return home.

## 2019-10-06 LAB — MAGNESIUM: Magnesium: 1.8 mg/dL (ref 1.7–2.4)

## 2019-10-06 LAB — CBC WITH DIFFERENTIAL/PLATELET
Abs Immature Granulocytes: 0.1 10*3/uL — ABNORMAL HIGH (ref 0.00–0.07)
Basophils Absolute: 0 10*3/uL (ref 0.0–0.1)
Basophils Relative: 0 %
Eosinophils Absolute: 0.9 10*3/uL — ABNORMAL HIGH (ref 0.0–0.5)
Eosinophils Relative: 9 %
HCT: 40.7 % (ref 36.0–46.0)
Hemoglobin: 12.1 g/dL (ref 12.0–15.0)
Immature Granulocytes: 1 %
Lymphocytes Relative: 23 %
Lymphs Abs: 2.4 10*3/uL (ref 0.7–4.0)
MCH: 22.7 pg — ABNORMAL LOW (ref 26.0–34.0)
MCHC: 29.7 g/dL — ABNORMAL LOW (ref 30.0–36.0)
MCV: 76.2 fL — ABNORMAL LOW (ref 80.0–100.0)
Monocytes Absolute: 0.8 10*3/uL (ref 0.1–1.0)
Monocytes Relative: 7 %
Neutro Abs: 6.1 10*3/uL (ref 1.7–7.7)
Neutrophils Relative %: 60 %
Platelets: 261 10*3/uL (ref 150–400)
RBC: 5.34 MIL/uL — ABNORMAL HIGH (ref 3.87–5.11)
RDW: 14.6 % (ref 11.5–15.5)
WBC: 10.3 10*3/uL (ref 4.0–10.5)
nRBC: 0 % (ref 0.0–0.2)

## 2019-10-06 LAB — COMPREHENSIVE METABOLIC PANEL
ALT: 79 U/L — ABNORMAL HIGH (ref 0–44)
AST: 92 U/L — ABNORMAL HIGH (ref 15–41)
Albumin: 2 g/dL — ABNORMAL LOW (ref 3.5–5.0)
Alkaline Phosphatase: 46 U/L (ref 38–126)
Anion gap: 6 (ref 5–15)
BUN: 9 mg/dL (ref 8–23)
CO2: 25 mmol/L (ref 22–32)
Calcium: 8.3 mg/dL — ABNORMAL LOW (ref 8.9–10.3)
Chloride: 110 mmol/L (ref 98–111)
Creatinine, Ser: 0.55 mg/dL (ref 0.44–1.00)
GFR calc Af Amer: >60 mL/min (ref >60)
GFR calc non Af Amer: >60 mL/min (ref >60)
Glucose, Bld: 107 mg/dL — ABNORMAL HIGH (ref 70–99)
Potassium: 3.9 mmol/L (ref 3.5–5.1)
Sodium: 141 mmol/L (ref 135–145)
Total Bilirubin: 0.4 mg/dL (ref 0.3–1.2)
Total Protein: 5.1 g/dL — ABNORMAL LOW (ref 6.5–8.1)

## 2019-10-06 LAB — CK: Total CK: 1895 U/L — ABNORMAL HIGH (ref 38–234)

## 2019-10-06 MED ORDER — POLYETHYLENE GLYCOL 3350 17 G PO PACK
17.0000 g | PACK | Freq: Every day | ORAL | Status: DC
  Administered 2019-10-06 – 2019-10-07 (×2): 17 g via ORAL
  Filled 2019-10-06 (×2): qty 1

## 2019-10-06 MED ORDER — IBUPROFEN 200 MG PO TABS
600.0000 mg | ORAL_TABLET | Freq: Four times a day (QID) | ORAL | Status: DC | PRN
  Administered 2019-10-06 – 2019-10-07 (×2): 600 mg via ORAL
  Filled 2019-10-06 (×2): qty 3

## 2019-10-06 MED ORDER — ACETAMINOPHEN 325 MG PO TABS: 650.0000 mg | ORAL_TABLET | Freq: Four times a day (QID) | ORAL | Status: DC | PRN

## 2019-10-06 NOTE — Progress Notes (Signed)
PROGRESS NOTE    Karen Best  OEH:[id number] DOB: 1955/12/30 DOA: 10/01/2019 PCP: [facility]    Brief Narrative:  64 year old female with history of bipolar 1 disorder, schizophrenia on lithium and Zyprexa, asthma, fibromyalgia, polysubstance abuse including cocaine and tobacco presented to the emergency room with altered mental status and unresponsiveness.  Patient was noted obtunded on arrival.  Reportedly at baseline, she is ambulatory and verbal.  Patient noted to be weak on the right side, hypotensive and had crack pipe in her hand.  Recently seen the neurologist office per ambulatory dysfunction.  In the emergency room she is afebrile.  Hypertensive.  Protecting her airway.  Leukocytosis with WC count 13.9.  Potassium 5.2.  Elevated AST and ALT.  UDS positive for cocaine.  MRI positive for acute posterior infarct.  Due to severity of condition, she was admitted and treated for acute stroke as well as acute meningitis or generalized infection.   Assessment & Plan:   Principal Problem:   AMS (altered mental status) Active Problems:   Schizophrenia ([place])   UTI (urinary tract infection)   Bipolar 1 disorder (HCC)   AKI (acute kidney injury) ([place])   Transaminitis   CVA (cerebral vascular accident) ([place])   Bacteremia due to Staphylococcus aureus   Dehydration with hypernatremia   Acute metabolic encephalopathy, multifactorial in the setting of underlying bipolar disorder and schizophrenia.  Bacteremia, UTI, cocaine use and or in combination.  Bacteremia due to Staphylococcus [place]: 3/4 blood cultures positive for coag negative Staphylococcus. Repeat blood cultures 6/8 negative so far. Echocardiogram normal.  Infectious disease recommended treatment with oral antibiotics for 2 weeks. Vancomycin for 4 days and doxycycline for for 10 days. Echo with no vegetation. CT scan of the right arm with swelling, no collections.  Acute UTI present on  admission secondary to E. coli: Treated with Rocephin for 3 days.  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  Ruled out.  Negative for bacterial infection.  HSV antigen negative.  Discontinue acyclovir.  Acute stroke present on admission: Clinical findings, obtunded.  Right hemiplegia. CT head findings, generalized parenchymal atrophy.  No acute findings. MRI of the brain, 6 mm acute infarct in the posterior left subinsular white matter.  Chronic small vessel disease.  Cavernous sinus thrombosis ruled out with MR venogram. 2D echocardiogram, no evidence of vegetation.  No blood clot. Antiplatelet therapy, none at home.  Started on aspirin. LDL 168.  AST ALT more than 200.  Trending down.  Will start statin once CK level normalizes. Hemoglobin A1c, 6.  No indication for treatment DVT prophylaxis, Lovenox. Therapy recommendations, skilled nursing facility.  Suppressed TSH: On lithium.  T3/T4 is normal.   Hypernatremia: Due to poor oral intake.  Normalizing.  CK level improving.  Discontinue IV fluids and monitor.  Renal functions normal.  Bipolar disorder and schizophrenia: On lithium and olanzapine that will be continued.  Lithium level was subtherapeutic.  Previous history of lithium toxicity.  Reportedly she was not taking medications.  Acute traumatic rhabdomyolysis: Due to cocaine, found down after a fall.  Renal functions normalizing.  Discontinue IV fluids and monitor.  DVT prophylaxis: Lovenox subcu Code Status: Full code Family Communication: None at the bedside.  Patient states she is talking to family. Disposition Plan: Status is: Inpatient  Remains inpatient appropriate because:Inpatient level of care appropriate due to severity of illness.  Patient will be ready to move to a skilled level of care in next 24 to 48 hours.  Dispo: The patient is from: Home              Anticipated d/c is to: SNF              Anticipated d/c date is: 2 days.               Patient currently is not medically stable to d/c.         Consultants:   Neurology  Infectious disease  Procedures:   Lumbar puncture  Antimicrobials:  Antibiotics Given (last 72 hours)    Date/Time Action Medication Dose Rate   10/03/19 1304 New Bag/Given   acyclovir (ZOVIRAX) 680 mg in dextrose 5 % 100 mL IVPB 680 mg 113.6 mL/hr   10/03/19 2150 New Bag/Given   cefTRIAXone (ROCEPHIN) 1 g in sodium chloride 0.9 % 100 mL IVPB 1 g 200 mL/hr   10/04/19 0226 New Bag/Given   acyclovir (ZOVIRAX) 680 mg in dextrose 5 % 100 mL IVPB 680 mg 113.6 mL/hr   10/04/19 0227 New Bag/Given   vancomycin (VANCOCIN) IVPB 1000 mg/200 mL premix 1,000 mg 200 mL/hr   10/05/19 0319 New Bag/Given   vancomycin (VANCOCIN) IVPB 1000 mg/200 mL premix 1,000 mg 200 mL/hr        Subjective: Patient seen and examined.  No overnight events.  Right arm feels heavy.  Objective: Vitals:   10/05/19 2004 10/05/19 2346 10/06/19 0331 10/06/19 0751  BP: 130/72 133/63 139/78 (!) 154/79  Pulse: 91 81 86 78  Resp: 18 15 17 18   Temp: 98.3 F (36.8 C) 98 F (36.7 C) 98.1 F (36.7 C) 98 F (36.7 C)  TempSrc: Oral Axillary Axillary Axillary  SpO2: 98% 98% 98% 98%  Weight:      Height:        Intake/Output Summary (Last 24 hours) at 10/06/2019 1200 Last data filed at 10/06/2019 0800 Gross per 24 hour  Intake 2820 ml  Output 5200 ml  Net -2380 ml   Filed Weights   10/02/19 0316  Weight: 67.5 kg    Examination: Physical Exam Constitutional:      Comments: Looks comfortable on room air.  HENT:     Head: Normocephalic.  Eyes:     Pupils: Pupils are equal, round, and reactive to light.  Cardiovascular:     Rate and Rhythm: Regular rhythm.  Pulmonary:     Breath sounds: Normal breath sounds.  Abdominal:     General: Bowel sounds are normal.  Musculoskeletal:     Cervical back: Neck supple.     Comments: Right upper extremity with diffuse swelling, no localized fluctuation or erythema. A  blister on the lateral right arm, unruptured blister on the right index finger.  Neurological:     Mental Status: She is alert.     Comments: She is mostly oriented, however has some cognitive delay response. Cranial nerves no deficit. Right upper and lower extremity with hemiplegia, 3/5.  Upper extremities difficult to move the lower extremity mostly due to edema.  Psychiatric:     Comments: Flat affect.  Normal mood.       Data Reviewed: I have personally reviewed following labs and imaging studies  CBC: Recent Labs  Lab 10/01/19 1729 10/01/19 1729 10/01/19 1801 10/02/19 0518 10/03/19 2350 10/05/19 0358 10/06/19 0505  WBC 13.9*  --   --  16.4* 9.4 9.8 10.3  NEUTROABS 11.6*  --   --   --  5.2 5.6 6.1  HGB 16.0*   < >  17.0* 15.6* 11.3* 12.7 12.1  HCT 53.5*   < > 50.0* 53.6* 38.3 43.2 40.7  MCV 75.7*  --   --  77.0* 77.1* 76.9* 76.2*  PLT 475*  --   --  409* 230 245 261   < > = values in this interval not displayed.   Basic Metabolic Panel: Recent Labs  Lab 10/01/19 1729 10/01/19 1729 10/01/19 1801 10/02/19 0518 10/03/19 2350 10/05/19 0358 10/06/19 0505  NA 145   < > 148* 150* 142 142 141  K 5.2*   < > 4.9 4.6 3.8 4.4 3.9  CL 116*   < > 117* 122* 114* 111 110  CO2 18*  --   --  19* 20* 25 25  GLUCOSE 142*   < > 142* 140* 99 92 107*  BUN 52*   < > 61* 37* 9 6* 9  CREATININE 1.18*   < > 1.30* 0.93 0.59 0.58 0.55  CALCIUM 9.5  --   --  9.3 8.3* 8.6* 8.3*  MG  --   --   --   --  2.0 1.9 1.8  PHOS  --   --   --   --  1.9*  --   --    < > = values in this interval not displayed.   GFR: Estimated Creatinine Clearance: 65.6 mL/min (by C-G formula based on SCr of 0.55 mg/dL). Liver Function Tests: Recent Labs  Lab 10/01/19 1729 10/02/19 0518 10/03/19 2350 10/05/19 0358 10/06/19 0505  AST 379* 362* 146* 127* 92*  ALT 153* 159* 89* 89* 79*  ALKPHOS 71 67 47 47 46  BILITOT 0.8 0.8 <0.1* 0.6 0.4  PROT 8.8* 7.3 5.6* 5.4* 5.1*  ALBUMIN 3.7 3.0* 2.3* 2.2* 2.0*    No results for input(s): LIPASE, AMYLASE in the last 168 hours. Recent Labs  Lab 10/02/19 0055  AMMONIA 24   Coagulation Profile: Recent Labs  Lab 10/01/19 1729  INR 1.1   Cardiac Enzymes: Recent Labs  Lab 10/01/19 2234 10/03/19 0315 10/03/19 2350 10/05/19 0358 10/06/19 0505  CKTOTAL 24,248* 5,699* 4,362* 3,355* 1,895*   BNP (last 3 results) No results for input(s): PROBNP in the last 8760 hours. HbA1C: No results for input(s): HGBA1C in the last 72 hours. CBG: Recent Labs  Lab 10/01/19 1714  GLUCAP 150*   Lipid Profile: No results for input(s): CHOL, HDL, LDLCALC, TRIG, CHOLHDL, LDLDIRECT in the last 72 hours. Thyroid Function Tests: Recent Labs    10/03/19 1454  FREET4 1.05   Anemia Panel: No results for input(s): VITAMINB12, FOLATE, FERRITIN, TIBC, IRON, RETICCTPCT in the last 72 hours. Sepsis Labs: No results for input(s): PROCALCITON, LATICACIDVEN in the last 168 hours.  Recent Results (from the past 240 hour(s))  SARS Coronavirus 2 by RT PCR (hospital order, performed in [facility] hospital lab) Nasopharyngeal Nasopharyngeal Swab     Status: None   Collection Time: 10/01/19  5:51 PM   Specimen: Nasopharyngeal Swab  Result Value Ref Range Status   SARS Coronavirus 2 NEGATIVE NEGATIVE Final    Comment: (NOTE) SARS-CoV-2 target nucleic acids are NOT DETECTED. The SARS-CoV-2 RNA is generally detectable in upper and lower respiratory specimens during the acute phase of infection. The lowest concentration of SARS-CoV-2 viral copies this assay can detect is 250 copies / mL. A negative result does not preclude SARS-CoV-2 infection and should not be used as the sole basis for treatment or other patient management decisions.  A negative result may occur with improper specimen  collection / handling, submission of specimen other than nasopharyngeal swab, presence of viral mutation(s) within the areas targeted by this assay, and inadequate number of viral  copies (<250 copies / mL). A negative result must be combined with clinical observations, patient history, and epidemiological information. Fact Sheet for Patients:   StrictlyIdeas.no Fact Sheet for Healthcare Providers: BankingDealers.co.za This test is not yet approved or cleared  by the [place] FDA and has been authorized for detection and/or diagnosis of SARS-CoV-2 by FDA under an Emergency Use Authorization (EUA).  This EUA will remain in effect (meaning this test can be used) for the duration of the COVID-19 declaration under Section 564(b)(1) of the Act, 21 U.S.C. section 360bbb-3(b)(1), unless the authorization is terminated or revoked sooner. Performed at [facility], [place] [address] [place], [place] [address]   Culture, blood (routine x 2)     Status: Abnormal   Collection Time: 10/02/19  1:15 AM   Specimen: BLOOD  Result Value Ref Range Status   Specimen Description BLOOD RIGHT ARM  Final   Special Requests   Final    BOTTLES DRAWN AEROBIC AND ANAEROBIC Blood Culture results may not be optimal due to an inadequate volume of blood received in culture bottles   Culture  Setup Time   Final    GRAM POSITIVE COCCI IN CLUSTERS IN BOTH AEROBIC AND ANAEROBIC BOTTLES CRITICAL VALUE NOTED.  VALUE IS CONSISTENT WITH PREVIOUSLY REPORTED AND CALLED VALUE.    Culture (A)  Final    STAPHYLOCOCCUS HOMINIS SUSCEPTIBILITIES PERFORMED ON PREVIOUS CULTURE WITHIN THE LAST 5 DAYS. Performed at [facility], [place] [address], [place], [place] [address]    Report Status 10/04/2019 FINAL  Final  Culture, blood (routine x 2)     Status: Abnormal   Collection Time: 10/02/19  1:15 AM   Specimen: BLOOD  Result Value Ref Range Status   Specimen Description BLOOD LEFT ARM  Final   Special Requests   Final    BOTTLES DRAWN AEROBIC AND ANAEROBIC Blood Culture results may not be optimal due to an inadequate volume of blood received in  culture bottles   Culture  Setup Time   Final    GRAM POSITIVE COCCI IN CLUSTERS AEROBIC BOTTLE ONLY CRITICAL RESULT CALLED TO, READ BACK BY AND VERIFIED WITH: PHARMD A MEYER AT 2041 10/02/19 BY L BENFIELD Performed at [facility], [place] [address], [place], [place] [address]    Culture STAPHYLOCOCCUS HOMINIS (A)  Final   Report Status 10/04/2019 FINAL  Final   Organism ID, Bacteria STAPHYLOCOCCUS HOMINIS  Final      Susceptibility   Staphylococcus hominis - MIC*    CIPROFLOXACIN <=0.5 SENSITIVE Sensitive     ERYTHROMYCIN >=8 RESISTANT Resistant     GENTAMICIN <=0.5 SENSITIVE Sensitive     OXACILLIN >=4 RESISTANT Resistant     TETRACYCLINE 4 SENSITIVE Sensitive     VANCOMYCIN <=0.5 SENSITIVE Sensitive     TRIMETH/SULFA 80 RESISTANT Resistant     CLINDAMYCIN <=0.25 SENSITIVE Sensitive     RIFAMPIN <=0.5 SENSITIVE Sensitive     Inducible Clindamycin NEGATIVE Sensitive     * STAPHYLOCOCCUS HOMINIS  Blood Culture ID Panel (Reflexed)     Status: Abnormal   Collection Time: 10/02/19  1:15 AM  Result Value Ref Range Status   Enterococcus species NOT DETECTED NOT DETECTED Final   Listeria monocytogenes NOT DETECTED NOT DETECTED Final   Staphylococcus species DETECTED (A) NOT DETECTED Final  Comment: Methicillin (oxacillin) resistant coagulase negative staphylococcus. Possible blood culture contaminant (unless isolated from more than one blood culture draw or clinical case suggests pathogenicity). No antibiotic treatment is indicated for blood  culture contaminants. CRITICAL RESULT CALLED TO, READ BACK BY AND VERIFIED WITH: PHARMD A MEYER AT 2041 10/02/19 BY L BENFIELD    Staphylococcus aureus (BCID) NOT DETECTED NOT DETECTED Final   Methicillin resistance DETECTED (A) NOT DETECTED Final    Comment: CRITICAL RESULT CALLED TO, READ BACK BY AND VERIFIED WITH: PHARMD A MEYER AT 2041 10/02/19 BY L BENFIELD    Streptococcus species NOT DETECTED NOT DETECTED Final   Streptococcus agalactiae  NOT DETECTED NOT DETECTED Final   Streptococcus pneumoniae NOT DETECTED NOT DETECTED Final   Streptococcus pyogenes NOT DETECTED NOT DETECTED Final   Acinetobacter baumannii NOT DETECTED NOT DETECTED Final   Enterobacteriaceae species NOT DETECTED NOT DETECTED Final   Enterobacter cloacae complex NOT DETECTED NOT DETECTED Final   Escherichia coli NOT DETECTED NOT DETECTED Final   Klebsiella oxytoca NOT DETECTED NOT DETECTED Final   Klebsiella pneumoniae NOT DETECTED NOT DETECTED Final   Proteus species NOT DETECTED NOT DETECTED Final   Serratia marcescens NOT DETECTED NOT DETECTED Final   Haemophilus influenzae NOT DETECTED NOT DETECTED Final   Neisseria meningitidis NOT DETECTED NOT DETECTED Final   Pseudomonas aeruginosa NOT DETECTED NOT DETECTED Final   Candida albicans NOT DETECTED NOT DETECTED Final   Candida glabrata NOT DETECTED NOT DETECTED Final   Candida krusei NOT DETECTED NOT DETECTED Final   Candida parapsilosis NOT DETECTED NOT DETECTED Final   Candida tropicalis NOT DETECTED NOT DETECTED Final    Comment: Performed at [facility], [place]. [address], [place], [place] [address]  CSF culture     Status: None   Collection Time: 10/02/19  2:46 AM   Specimen: CSF; Cerebrospinal Fluid  Result Value Ref Range Status   Specimen Description CSF  Final   Special Requests NONE  Final   Gram Stain   Final    CYTOSPIN SMEAR WBC PRESENT,BOTH PMN AND MONONUCLEAR NO ORGANISMS SEEN    Culture   Final    NO GROWTH 3 DAYS Performed at [facility], [place] [address] [place], [place] [address]    Report Status 10/05/2019 FINAL  Final  Culture, fungus without smear     Status: None (Preliminary result)   Collection Time: 10/02/19  2:47 AM   Specimen: CSF; Cerebrospinal Fluid  Result Value Ref Range Status   Specimen Description CSF  Final   Special Requests NONE  Final   Culture   Final    NO GROWTH 3 DAYS Performed at [facility], [place] [address], [place],  [place] [address]    Report Status PENDING  Incomplete  Culture, Urine     Status: Abnormal   Collection Time: 10/02/19  1:00 PM   Specimen: Urine, Random  Result Value Ref Range Status   Specimen Description URINE, RANDOM  Final   Special Requests   Final    NONE Performed at [facility], [place] [address] [place] [place], [place] [address]    Culture >=100,000 COLONIES/mL ESCHERICHIA COLI (A)  Final   Report Status 10/04/2019 FINAL  Final   Organism ID, Bacteria ESCHERICHIA COLI (A)  Final      Susceptibility   Escherichia coli - MIC*    AMPICILLIN <=2 SENSITIVE Sensitive     CEFAZOLIN <=4 SENSITIVE Sensitive     CEFTRIAXONE <=1  SENSITIVE Sensitive     CIPROFLOXACIN <=0.25 SENSITIVE Sensitive     GENTAMICIN <=1 SENSITIVE Sensitive     IMIPENEM <=0.25 SENSITIVE Sensitive     NITROFURANTOIN <=16 SENSITIVE Sensitive     TRIMETH/SULFA <=20 SENSITIVE Sensitive     AMPICILLIN/SULBACTAM <=2 SENSITIVE Sensitive     PIP/TAZO <=4 SENSITIVE Sensitive     * >=100,000 COLONIES/mL ESCHERICHIA COLI  Culture, blood (routine x 2)     Status: None (Preliminary result)   Collection Time: 10/04/19 12:01 AM   Specimen: BLOOD  Result Value Ref Range Status   Specimen Description BLOOD LEFT ARM  Final   Special Requests   Final    BOTTLES DRAWN AEROBIC ONLY Blood Culture adequate volume   Culture   Final    NO GROWTH 2 DAYS Performed at [facility], [address] [place], [place] [address]    Report Status PENDING  Incomplete  Culture, blood (routine x 2)     Status: None (Preliminary result)   Collection Time: 10/04/19 12:04 AM   Specimen: BLOOD  Result Value Ref Range Status   Specimen Description BLOOD RIGHT ARM  Final   Special Requests   Final    BOTTLES DRAWN AEROBIC ONLY Blood Culture results may not be optimal due to an inadequate volume of blood received in culture bottles   Culture   Final    NO GROWTH 2 DAYS Performed at [facility], [place] [address], [place], [place]  [address]    Report Status PENDING  Incomplete         Radiology Studies: CT HUMERUS RIGHT W CONTRAST  Result Date: 10/04/2019 CLINICAL DATA:  Right arm swelling and pain EXAM: CT OF THE UPPER RIGHT EXTREMITY WITH CONTRAST TECHNIQUE: Multidetector CT imaging of the upper right extremity was performed according to the standard protocol following intravenous contrast administration. COMPARISON:  None. CONTRAST:  133mL OMNIPAQUE IOHEXOL 300 MG/ML  SOLN FINDINGS: Bones/Joint/Cartilage No fracture or dislocation. Normal alignment. No joint effusion. Ligaments Ligaments are suboptimally evaluated by CT. Muscles and Tendons Muscles are normal.  No intramuscular fluid collection or hematoma. Soft tissue No fluid collection or hematoma. No soft tissue mass. Circumferential soft tissue edema and mild skin thickening involving the right upper arm concerning for cellulitis. No drainable fluid collection. IMPRESSION: Circumferential soft tissue edema and mild skin thickening involving the right upper arm concerning for cellulitis. No drainable fluid collection to suggest an abscess. Electronically Signed   By: Kathreen Devoid   On: 10/04/2019 12:18        Scheduled Meds: . aspirin  324 mg Oral Daily  . doxycycline  100 mg Oral Q12H  . enoxaparin (LOVENOX) injection  40 mg Subcutaneous QHS  . lithium citrate  198 mg Oral TID  . OLANZapine  10 mg Oral Daily  . OLANZapine  20 mg Oral QHS  . polyethylene glycol  17 g Oral Daily   Continuous Infusions:    LOS: 5 days    Time spent: 25 minutes    Barb Merino, MD [facility] Pager [phone]

## 2019-10-06 NOTE — TOC Progression Note (Signed)
Transition of Care [facility]) - Progression Note    Patient Details  Name: Karen Best MRN: [id number] Date of Birth: 09-20-1955  [facility] CM/SW Contact  Pollie Friar, RN Phone Number: 10/06/2019, 1:41 PM  Clinical Narrative:    Pt currently has no bed offers. CM has asked [facility] and [facility] to please review.  TOC following.   Expected Discharge Plan: [place] Barriers to Discharge: Continued Medical Work up  Expected Discharge Plan and Services Expected Discharge Plan: [place]   Discharge Planning Services: CM Consult Post Acute Care Choice: [place] Living arrangements for the past 2 months: Apartment                                       Social Determinants of Health (SDOH) Interventions    Readmission Risk Interventions No flowsheet data found.

## 2019-10-07 LAB — CBC WITH DIFFERENTIAL/PLATELET
Abs Immature Granulocytes: 0.11 10*3/uL — ABNORMAL HIGH (ref 0.00–0.07)
Basophils Absolute: 0 10*3/uL (ref 0.0–0.1)
Basophils Relative: 0 %
Eosinophils Absolute: 0.9 10*3/uL — ABNORMAL HIGH (ref 0.0–0.5)
Eosinophils Relative: 8 %
HCT: 42.3 % (ref 36.0–46.0)
Hemoglobin: 12.5 g/dL (ref 12.0–15.0)
Immature Granulocytes: 1 %
Lymphocytes Relative: 18 %
Lymphs Abs: 1.8 10*3/uL (ref 0.7–4.0)
MCH: 22.7 pg — ABNORMAL LOW (ref 26.0–34.0)
MCHC: 29.6 g/dL — ABNORMAL LOW (ref 30.0–36.0)
MCV: 76.8 fL — ABNORMAL LOW (ref 80.0–100.0)
Monocytes Absolute: 0.8 10*3/uL (ref 0.1–1.0)
Monocytes Relative: 8 %
Neutro Abs: 6.7 10*3/uL (ref 1.7–7.7)
Neutrophils Relative %: 65 %
Platelets: 289 10*3/uL (ref 150–400)
RBC: 5.51 MIL/uL — ABNORMAL HIGH (ref 3.87–5.11)
RDW: 14.6 % (ref 11.5–15.5)
WBC: 10.3 10*3/uL (ref 4.0–10.5)
nRBC: 0 % (ref 0.0–0.2)

## 2019-10-07 LAB — MAGNESIUM: Magnesium: 1.8 mg/dL (ref 1.7–2.4)

## 2019-10-07 LAB — SARS CORONAVIRUS 2 BY RT PCR (HOSPITAL ORDER, PERFORMED IN ~~LOC~~ HOSPITAL LAB): SARS Coronavirus 2: NEGATIVE

## 2019-10-07 LAB — CK: Total CK: 1180 U/L — ABNORMAL HIGH (ref 38–234)

## 2019-10-07 MED ORDER — INGREZZA 80 MG PO CAPS: 40.0000 mg | ORAL_CAPSULE | Freq: Every day | ORAL | Status: DC

## 2019-10-07 MED ORDER — ASPIRIN 81 MG PO CHEW: 324.0000 mg | CHEWABLE_TABLET | Freq: Every day | ORAL | Status: DC

## 2019-10-07 MED ORDER — LITHIUM CARBONATE ER 300 MG PO TBCR: 300.0000 mg | EXTENDED_RELEASE_TABLET | Freq: Two times a day (BID) | ORAL | 0 refills | Status: DC

## 2019-10-07 MED ORDER — DOXYCYCLINE HYCLATE 100 MG PO TABS: 100.0000 mg | ORAL_TABLET | Freq: Two times a day (BID) | ORAL | 0 refills | Status: AC

## 2019-10-07 MED ORDER — ATORVASTATIN CALCIUM 40 MG PO TABS
40.0000 mg | ORAL_TABLET | Freq: Every day | ORAL | 11 refills | Status: DC
Start: 2019-10-07 — End: 2020-09-26

## 2019-10-07 NOTE — Plan of Care (Signed)
  Problem: Education: Goal: Knowledge of General Education information will improve Description: Including pain rating scale, medication(s)/side effects and non-pharmacologic comfort measures Outcome: Adequate for Discharge   Problem: Health Behavior/Discharge Planning: Goal: Ability to manage health-related needs will improve Outcome: Adequate for Discharge   Problem: Clinical Measurements: Goal: Ability to maintain clinical measurements within normal limits will improve Outcome: Adequate for Discharge Goal: Will remain free from infection Outcome: Adequate for Discharge Goal: Diagnostic test results will improve Outcome: Adequate for Discharge Goal: Respiratory complications will improve Outcome: Adequate for Discharge Goal: Cardiovascular complication will be avoided Outcome: Adequate for Discharge   Problem: Activity: Goal: Risk for activity intolerance will decrease Outcome: Adequate for Discharge   Problem: Nutrition: Goal: Adequate nutrition will be maintained Outcome: Adequate for Discharge   Problem: Coping: Goal: Level of anxiety will decrease Outcome: Adequate for Discharge   Problem: Pain Managment: Goal: General experience of comfort will improve Outcome: Adequate for Discharge   Problem: Safety: Goal: Ability to remain free from injury will improve Outcome: Adequate for Discharge   Problem: Skin Integrity: Goal: Risk for impaired skin integrity will decrease Outcome: Adequate for Discharge   Problem: Education: Goal: Knowledge of disease or condition will improve Outcome: Adequate for Discharge Goal: Knowledge of secondary prevention will improve Outcome: Adequate for Discharge Goal: Knowledge of patient specific risk factors addressed and post discharge goals established will improve Outcome: Adequate for Discharge Goal: Individualized Educational Video(s) Outcome: Adequate for Discharge   Problem: Health Behavior/Discharge Planning: Goal:  Ability to manage health-related needs will improve Outcome: Adequate for Discharge   Problem: Self-Care: Goal: Ability to communicate needs accurately will improve Outcome: Adequate for Discharge   Problem: Nutrition: Goal: Risk of aspiration will decrease Outcome: Adequate for Discharge   Problem: Ischemic Stroke/TIA Tissue Perfusion: Goal: Complications of ischemic stroke/TIA will be minimized Outcome: Adequate for Discharge

## 2019-10-07 NOTE — Progress Notes (Signed)
Physical Therapy Treatment Patient Details Name: Karen Best MRN: [id number] DOB: [birth year] Today's Date: 10/07/2019    History of Present Illness Pt is a 64 y/o female admitted secondary to toxic metabolic encephalopathy. Also found to have L subinsular white matter infarct. PMH includes schizophrenia, bipolar disorder, CVA.     PT Comments    Patient is making progress toward PT goals and able to SPT bed to [facility] [employer] and BSC to recliner with mod A +2. Pt demonstrates improved ability to advance R LE during transfers this session. Continue to progress as tolerated with anticipated d/c to SNF for further skilled PT services.     Follow Up Recommendations  SNF;Supervision/Assistance - 24 hour     Equipment Recommendations  Other (comment) (TBD)    Recommendations for Other Services       Precautions / Restrictions Precautions Precautions: Fall Restrictions Weight Bearing Restrictions: No    Mobility  Bed Mobility Overal bed mobility: Needs Assistance Bed Mobility: Supine to Sit     Supine to sit: HOB elevated;Mod assist     General bed mobility comments: assist to R LE to EOB and to elevate trunk into sitting  Transfers Overall transfer level: Needs assistance Equipment used: 2 person hand held assist Transfers: Sit to/from [employer] Transfers Sit to Stand: Mod assist;+2 physical assistance;Min assist Stand pivot transfers: Mod assist;+2 physical assistance       General transfer comment: pt stood from EOB and X 2 trials from [facility]; +2 assist required to power up into standing and then to maintain balance when pivoting X 2 trials; cues for sequencing, posture, and weight shifting; pt with less difficulty stepping with R LE   Ambulation/Gait                 Stairs             Wheelchair Mobility    Modified Rankin (Stroke Patients Only)       Balance Overall balance assessment: Needs assistance Sitting-balance support: Feet  supported Sitting balance-Leahy Scale: Fair     Standing balance support: Bilateral upper extremity supported Standing balance-Leahy Scale: Poor                              Cognition Arousal/Alertness: Awake/alert Behavior During Therapy: WFL for tasks assessed/performed Overall Cognitive Status: Within Functional Limits for tasks assessed                                        Exercises      General Comments General comments (skin integrity, edema, etc.): R UE edematous and with blister on proximal forearm      Pertinent Vitals/Pain Pain Assessment: Faces Faces Pain Scale: Hurts even more Pain Location: RUE with movement Pain Descriptors / Indicators: Discomfort;Grimacing Pain Intervention(s): Limited activity within patient's tolerance;Monitored during session;Repositioned    Home Living                      Prior Function            PT Goals (current goals can now be found in the care plan section) Progress towards PT goals: Progressing toward goals    Frequency    Min 2X/week      PT Plan Current plan remains appropriate    Co-evaluation  AM-PAC PT "6 Clicks" Mobility   Outcome Measure  Help needed turning from your back to your side while in a flat bed without using bedrails?: A Lot Help needed moving from lying on your back to sitting on the side of a flat bed without using bedrails?: A Lot Help needed moving to and from a bed to a chair (including a wheelchair)?: A Lot Help needed standing up from a chair using your arms (e.g., wheelchair or bedside chair)?: A Lot Help needed to walk in hospital room?: Total Help needed climbing 3-5 steps with a railing? : Total 6 Click Score: 10    End of Session Equipment Utilized During Treatment: Gait belt Activity Tolerance: Patient tolerated treatment well Patient left: with call bell/phone within reach;in chair;with chair alarm set Nurse  Communication: Mobility status PT Visit Diagnosis: Unsteadiness on feet (R26.81);Muscle weakness (generalized) (M62.81);Difficulty in walking, not elsewhere classified (R26.2)     Time: 9758-8325 PT Time Calculation (min) (ACUTE ONLY): 23 min  Charges:  $Gait Training: 8-22 mins $Therapeutic Activity: 8-22 mins                     Earney Navy, PTA Acute Rehabilitation Services Pager: [phone] Office: [phone]     Darliss Cheney 10/07/2019, 5:15 PM

## 2019-10-07 NOTE — Progress Notes (Signed)
Karen Best to be D/C'd Skilled nursing facility per MD order.  Discussed with the patient and all questions fully answered.  VSS, Skin clean, dry and intact without evidence of skin break down,  evidence of skin tears noted on right elbow. Called report to the nursing home and report given to nurse. All questions answered and nurse verbalized understanding. IV catheter discontinued intact. Site without signs and symptoms of complications. Dressing and pressure applied.  An After Visit Summary was printed and given to the [facility].  D/c education completed with PTAR personell including follow up instructions, medication list, d/c activities limitations if indicated, with other d/c instructions as indicated by MD.   Patient taken out of the unit via stretcher at 1537 , and D/C to SNF via ambulance.Dorris Carnes 10/07/2019 3:59 PM

## 2019-10-07 NOTE — TOC Transition Note (Signed)
Transition of Care [facility]) - CM/SW Discharge Note   Patient Details  Name: Karen Best MRN: [id number] Date of Birth: 06-30-1955  Transition of Care [facility] CM/SW Contact:  Pollie Friar, RN Phone Number: 10/07/2019, 2:36 PM   Clinical Narrative:    Pt discharging to [facility] SNF today for rehab. Pt to transport via PTAR and report called. Bedside RN updated and d/c packet at the desk. CM updated her ACT team of d/c per her request.   Room: 202 Number for report: [phone]   Final next level of care: Skilled Nursing Facility Barriers to Discharge: No Barriers Identified   Patient Goals and CMS Choice   CMS Medicare.gov Compare Post Acute Care list provided to:: Patient Choice offered to / list presented to : Patient  Discharge Placement              Patient chooses bed at: [facility] Patient to be transferred to facility by: [place] Name of family member notified: Jeanett Schlein from [facility] team Patient and family notified of of transfer: 10/07/19  Discharge Plan and Services   Discharge Planning Services: CM Consult Post Acute Care Choice: [place]                               Social Determinants of Health (SDOH) Interventions     Readmission Risk Interventions No flowsheet data found.

## 2019-10-07 NOTE — Discharge Summary (Signed)
Physician Discharge Summary  Karen Best XLK:[id number] DOB: 1955-07-09 DOA: 10/01/2019  PCP: [facility]  Admit date: 10/01/2019 Discharge date: 10/07/2019  Admitted From: Home Disposition: Skilled nursing facility  Recommendations for Outpatient Follow-up:  1. Follow up with PCP in 1-2 weeks 2. Please obtain CMP/CBC/CK levels in one week 3. Please schedule follow-up with her psychiatrist   Discharge Condition: Stable CODE STATUS: Full code Diet recommendation: Regular diet, plenty of water  Discharge summary: 64 year old female with history of bipolar 1 disorder, schizophrenia on lithium and Zyprexa, asthma, fibromyalgia, polysubstance abuse including cocaine and tobacco presented to the emergency room with altered mental status and unresponsiveness.  Patient was noted obtunded on arrival.  Reportedly at baseline, she is ambulatory and verbal.  Patient noted to be weak on the right side, hypotensive and had crack pipe in her hand.  Recently seen the neurologist office for ambulatory dysfunction.  Previous admissions with lithium toxicity is.  In the emergency room she is afebrile.  Hypertensive.  Protecting her airway.  Leukocytosis with WC count 13.9.  Potassium 5.2.  Elevated AST and ALT.  UDS positive for cocaine.  MRI positive for acute posterior infarct.  Due to severity of condition, she was admitted and treated for acute stroke as well as acute meningitis or generalized infection.  Treated for following conditions.   # Acute metabolic encephalopathy, multifactorial in the setting of underlying bipolar disorder and schizophrenia.  Bacteremia, UTI, cocaine use and or in combination.  Suspected meningeal encephalitis, ruled out. Improved.  Normalized.  Currently normal mentation.  # Bacteremia due to Staphylococcus Hominis: 3/4 blood cultures positive for coag negative Staphylococcus.  Primary source is probably right arm skin lesions. Repeat blood  cultures 6/8 negative so far. Echocardiogram normal.  Infectious disease recommended treatment with oral antibiotics for 2 weeks. Vancomycin for 4 days and doxycycline for for 10 days. Echo with no vegetation. CT scan of the right arm with swelling, no collections.  # Acute UTI present on admission secondary to E. coli: Treated with Rocephin for 3 days.  # Acute stroke present on admission: Clinical findings, obtunded.  Right hemiplegia.  Mental status improved.  Right hemiplegia persist. CT head findings, generalized parenchymal atrophy.  No acute findings. MRI of the brain, 6 mm acute infarct in the posterior left subinsular white matter.  Chronic small vessel disease.  Cavernous sinus thrombosis ruled out with MR venogram. 2D echocardiogram, no evidence of vegetation.  No blood clot. Antiplatelet therapy, none at home.  Started on aspirin.  Continue aspirin. LDL 168.    Liver function test and CK levels are normalizing.  Will start on Lipitor 40 mg daily.  Repeat CK levels in 1 week.   Hemoglobin A1c, 6.  No indication for treatment Therapy recommendations, skilled nursing facility.  # Hypernatremia: Due to poor oral intake.    Improved.  Renal function improved.    # Bipolar disorder and schizophrenia: On lithium and olanzapine that will be continued.  Lithium level was subtherapeutic.  Previous history of lithium toxicity.   Multiple disparities and medications.  I reviewed her previous records and will put her on following, Zyprexa 10 mg in the morning and 20 mg at night.   Lithium carbonate 300 mg twice a day, last suggested dose by her psychiatrist. Will reduce dose of Ingrezza from 80 mg to 40 mg that was suggested by her neurologist. Please schedule follow-up with her psychiatrist after discharge.  # Acute traumatic rhabdomyolysis: Due to  cocaine, found down after a fall.  Renal functions normalized.  Adequate oral intake.  Continue encourage fluid and diet. Patient is  starting on Lipitor.  Recheck CK levels and LFTs in 1 week.   Discharge Diagnoses:  Principal Problem:   AMS (altered mental status) Active Problems:   Schizophrenia ([place])   UTI (urinary tract infection)   Bipolar 1 disorder (HCC)   AKI (acute kidney injury) ([place])   Transaminitis   CVA (cerebral vascular accident) ([place])   Bacteremia due to Staphylococcus aureus   Dehydration with hypernatremia    Discharge Instructions  Discharge Instructions    Diet general   Complete by: As directed    Increase activity slowly   Complete by: As directed      Allergies as of 10/07/2019   No Known Allergies     Medication List    STOP taking these medications   cephALEXin 500 MG capsule Commonly known as: KEFLEX   cetirizine 10 MG tablet Commonly known as: ZYRTEC   cyclobenzaprine 10 MG tablet Commonly known as: FLEXERIL   lidocaine 5 % Commonly known as: Lidoderm     TAKE these medications   acetaminophen 325 MG tablet Commonly known as: TYLENOL Take 650 mg by mouth every 6 (six) hours as needed for mild pain or moderate pain.   aspirin 81 MG chewable tablet Chew 4 tablets (324 mg total) by mouth daily. Start taking on: October 08, 2019   atorvastatin 40 MG tablet Commonly known as: Lipitor Take 1 tablet (40 mg total) by mouth daily.   doxycycline 100 MG tablet Commonly known as: VIBRA-TABS Take 1 tablet (100 mg total) by mouth every 12 (twelve) hours for 8 days.   famotidine 40 MG tablet Commonly known as: PEPCID Take 40 mg by mouth 2 (two) times daily.   hydrOXYzine 50 MG tablet Commonly known as: ATARAX/VISTARIL Take 50 mg by mouth at bedtime.   ibuprofen 200 MG tablet Commonly known as: ADVIL Take 400 mg by mouth every 6 (six) hours as needed for headache or mild pain.   Ingrezza 80 MG Caps Generic drug: Valbenazine Tosylate Take 40 mg by mouth at bedtime. What changed: how much to take   lithium carbonate 300 MG CR tablet Commonly known as:  LITHOBID Take 1 tablet (300 mg total) by mouth 2 (two) times daily. What changed: Another medication with the same name was removed. Continue taking this medication, and follow the directions you see here.   loratadine 10 MG tablet Commonly known as: CLARITIN Take 10 mg by mouth daily.   OLANZapine 10 MG tablet Commonly known as: ZYPREXA Take 10 mg by mouth daily.   OLANZapine 20 MG tablet Commonly known as: ZYPREXA Take 1 tablet (20 mg total) by mouth at bedtime.       Contact information for after-discharge care    [facility] .   Service: Skilled Nursing Contact information: [place] [phone]                 No Known Allergies  Consultations:  Neurology   Procedures/Studies: DG Abd 1 View  Result Date: 10/01/2019 CLINICAL DATA:  MRI screening EXAM: ABDOMEN - 1 VIEW COMPARISON:  07/22/2015 FINDINGS: Normal bowel gas pattern. Numerous EKG leads project over abdomen and pelvis. No additional radiopaque foreign bodies identified. Osseous structures unremarkable. IMPRESSION: Numerous EKG leads project over abdomen and pelvis. Electronically Signed   By: Crist Infante.D.  On: 10/01/2019 19:27   CT HEAD WO CONTRAST  Result Date: 10/01/2019 CLINICAL DATA:  Ataxia, stroke suspected. EXAM: CT HEAD WITHOUT CONTRAST TECHNIQUE: Contiguous axial images were obtained from the base of the skull through the vertex without intravenous contrast. COMPARISON:  Head CT 09/20/2019. FINDINGS: Brain: Stable, mild generalized parenchymal atrophy. Mild ill-defined hypoattenuation within the cerebral white matter is nonspecific, but consistent with chronic small vessel ischemic disease. There is no acute intracranial hemorrhage. No demarcated cortical infarct. No extra-axial fluid collection. No evidence of intracranial mass. No midline shift. Vascular: No hyperdense vessel. Skull: Normal. Negative for fracture or focal  lesion. Sinuses/Orbits: Visualized orbits show no acute finding. No significant paranasal sinus disease at the imaged levels. Right mastoid effusion. IMPRESSION: No CT evidence of acute intracranial abnormality. Stable generalized parenchymal atrophy and chronic small vessel ischemic disease. Chronic right mastoid effusion. Electronically Signed   By: Kellie Simmering DO   On: 10/01/2019 18:30   CT HEAD WO CONTRAST  Result Date: 09/20/2019 CLINICAL DATA:  Head trauma EXAM: CT HEAD WITHOUT CONTRAST TECHNIQUE: Contiguous axial images were obtained from the base of the skull through the vertex without intravenous contrast. COMPARISON:  CT brain 05/30/2019 FINDINGS: Brain: No acute territorial infarction, hemorrhage, or intracranial mass. Mild atrophy. Minimal hypodensity in the white matter consistent with chronic small vessel ischemic change. Stable ventricle size Vascular: No hyperdense vessels.  No unexpected calcification Skull: No fracture.  Right mastoid effusion Sinuses/Orbits: No acute finding. Other: None IMPRESSION: 1. No CT evidence for acute intracranial abnormality. 2. Atrophy and mild chronic small vessel ischemic change of the white matter Electronically Signed   By: Donavan Foil M.D.   On: 09/20/2019 23:29   CT HUMERUS RIGHT W CONTRAST  Result Date: 10/04/2019 CLINICAL DATA:  Right arm swelling and pain EXAM: CT OF THE UPPER RIGHT EXTREMITY WITH CONTRAST TECHNIQUE: Multidetector CT imaging of the upper right extremity was performed according to the standard protocol following intravenous contrast administration. COMPARISON:  None. CONTRAST:  12mL OMNIPAQUE IOHEXOL 300 MG/ML  SOLN FINDINGS: Bones/Joint/Cartilage No fracture or dislocation. Normal alignment. No joint effusion. Ligaments Ligaments are suboptimally evaluated by CT. Muscles and Tendons Muscles are normal.  No intramuscular fluid collection or hematoma. Soft tissue No fluid collection or hematoma. No soft tissue mass. Circumferential  soft tissue edema and mild skin thickening involving the right upper arm concerning for cellulitis. No drainable fluid collection. IMPRESSION: Circumferential soft tissue edema and mild skin thickening involving the right upper arm concerning for cellulitis. No drainable fluid collection to suggest an abscess. Electronically Signed   By: Kathreen Devoid   On: 10/04/2019 12:18   MR BRAIN WO CONTRAST  Addendum Date: 10/02/2019   ADDENDUM REPORT: 10/02/2019 00:01 ADDENDUM: These results were called by telephone at the time of interpretation on 10/02/2019 at 11:00 AM to provider Dr. Flossie Buffy , who verbally acknowledged these results. Electronically Signed   By: Kellie Simmering DO   On: 10/02/2019 00:01   Result Date: 10/02/2019 CLINICAL DATA:  Focal neuro deficit, greater than 6 hours, stroke suspected. Additional history provided collision EXAM: MRI HEAD WITHOUT CONTRAST TECHNIQUE: Multiplanar, multiecho pulse sequences of the brain and surrounding structures were obtained without intravenous contrast. COMPARISON:  Prior head CT examinations 10/01/2019 and earlier, brain MRI 12/25/2018 FINDINGS: Brain: There is a 6 mm focus of restricted diffusion within the posterior left subinsular white matter consistent with acute infarction (series 5, image 73). No acute infarct is identified elsewhere within the brain. There is mild  scattered T2/FLAIR hyperintensity within the cerebral white matter which is nonspecific, but consistent with chronic small vessel ischemic disease. These findings are similar as compared to prior MRI 12/25/2018. Redemonstrated small chronic lacunar infarct within the right lentiform nucleus. No evidence of intracranial mass. No chronic intracranial blood products. No extra-axial fluid collection. No midline shift. Vascular: There is FLAIR hyperintensity within the right transverse and sigmoid dural venous sinuses extending into the right jugular bulb and upper right jugular vein. Expected proximal arterial flow  voids. Skull and upper cervical spine: No focal marrow lesion. Sinuses/Orbits: Visualized orbits show no acute finding. Trace right sphenoid sinus mucosal thickening. Chronic right mastoid effusion. Multiple bilateral adenoid cysts are redemonstrated. IMPRESSION: 6 mm acute infarct within the posterior left subinsular white matter. Background mild chronic small vessel ischemic changes within the cerebral white matter. Redemonstrated small chronic right basal ganglia lacunar infarct. There is FLAIR hyperintensity within the right transverse and sigmoid dural venous sinuses extending into the right jugular bulb and upper right jugular vein. This may be due to slow flow. However, contrast-enhanced CT or MR venography is recommended to exclude dural venous sinus thrombosis. Chronic right mastoid effusion with evidence of right-sided eustachian tube dysfunction in the setting of multiple bilateral adenoids cysts. These findings were also present on prior MRI 12/25/2018. ENT follow-up is recommended. Electronically Signed: By: Kellie Simmering DO On: 10/01/2019 22:56   DG Chest Port 1 View  Result Date: 10/01/2019 CLINICAL DATA:  Stroke-like symptoms, right-sided facial droop, history of cocaine abuse EXAM: PORTABLE CHEST 1 VIEW COMPARISON:  07/18/2019, 09/05/2018 FINDINGS: Single frontal view of the chest demonstrates an unremarkable cardiac silhouette. No airspace disease, effusion, or pneumothorax. Chronic background scarring. No acute bony abnormalities. Chronic punctate radiodensities within the left shoulder likely shrapnel. IMPRESSION: 1. No acute intrathoracic process. Electronically Signed   By: Randa Ngo M.D.   On: 10/01/2019 20:28   EEG adult  Result Date: 10/02/2019 Lora Havens, MD     10/02/2019 12:17 PM Patient Name: Karen Best MRN: [id number] Epilepsy Attending: Lora Havens Referring Physician/Provider: Dr Amie [place] Date: 09/01/2019 Duration: 25.47 mins Patient history: 63yo F with  ams. EEG to evaluate for seizure. Level of alertness:  lethargic AEDs during EEG study: None Technical aspects: This EEG study was done with scalp electrodes positioned according to the 10-20 International system of electrode placement. Electrical activity was acquired at a sampling rate of 500Hz  and reviewed with a high frequency filter of 70Hz  and a low frequency filter of 1Hz . EEG data were recorded continuously and digitally stored. Description: No clear posterior dominant rhythm was seen. EEG showed continuos generalized 3 to 6 Hz theta-delta slowing. Hyperventilation and photic stimulation were not performed.   ABNORMALITY -Continuous slow, generalized IMPRESSION: This study is suggestive of moderate diffuse encephalopathy, nonspecific etiology. No seizures or epileptiform discharges were seen throughout the recording. Lora Havens   MR MRV HEAD W WO CONTRAST  Result Date: 10/02/2019 CLINICAL DATA:  Initial evaluation for acute encephalopathy, possible venous sinus thrombosis on previous brain MRI. EXAM: MR VENOGRAM HEAD WITHOUT AND WITH CONTRAST TECHNIQUE: Angiographic images of the intracranial venous structures were obtained using MRV technique without and with intravenous contrast. CONTRAST:  7.61mL GADAVIST GADOBUTROL 1 MMOL/ML IV SOLN COMPARISON:  Prior brain MRI from 10/01/2019. FINDINGS: Normal flow related signal seen throughout the superior sagittal sinus to the level of the torcula. Torcula itself is patent. Normal signal seen throughout the transverse and sigmoid sinuses bilaterally as well as  the visualized proximal internal jugular veins. Probable small diverticulum noted at the left jugular bulb (series 5, image 73). This protrudes inferiorly and laterally, and is unlikely to be of clinical significance. Straight sinus, vein of Galen, internal cerebral veins, and basal veins of Rosenthal appear patent. No evidence for dural sinus thrombosis. No appreciable dural sinus stenosis. IMPRESSION:  Negative intracranial MRV. With no evidence for dural sinus thrombosis identified. Previously noted FLAIR signal abnormality involving the right transverse and sigmoid sinuses most consistent with slow flow. Electronically Signed   By: Jeannine Boga M.D.   On: 10/02/2019 00:38   ECHOCARDIOGRAM COMPLETE  Result Date: 10/04/2019    ECHOCARDIOGRAM REPORT   Patient Name:   Karen Best Date of Exam: 10/04/2019 Medical Rec #:  [id number]         Height:       65.0 in Accession #:    [id number]        Weight:       148.8 lb Date of Birth:  [birth year]         BSA:          1.745 m Patient Age:    64 years          BP:           120/60 mmHg Patient Gender: F                 HR:           71 bpm. Exam Location:  Inpatient Procedure: 2D Echo, Color Doppler and Cardiac Doppler Indications:    Stroke i163.9  History:        Patient has no prior history of Echocardiogram examinations.  Sonographer:    Raquel Sarna Senior RDCS Referring Phys: [id number] Emerson T TU  Sonographer Comments: Respiratory windows. IMPRESSIONS  1. Left ventricular ejection fraction, by estimation, is 65 to 70%. The left ventricle has normal function. The left ventricle has no regional wall motion abnormalities. There is mild asymmetric left ventricular hypertrophy of the basal-septal segment. Left ventricular diastolic parameters were normal.  2. Right ventricular systolic function was not well visualized. The right ventricular size is normal. There is normal pulmonary artery systolic pressure. The estimated right ventricular systolic pressure is 47.0 mmHg.  3. The mitral valve is normal in structure. No evidence of mitral valve regurgitation.  4. The aortic valve was not well visualized. Aortic valve regurgitation is not visualized. No aortic stenosis is present.  5. The inferior vena cava is normal in size with greater than 50% respiratory variability, suggesting right atrial pressure of 3 mmHg.  6. No vegetation seen, though technically difficult  study. If clinical suspicion for endocarditis, consider TEE FINDINGS  Left Ventricle: Left ventricular ejection fraction, by estimation, is 65 to 70%. The left ventricle has normal function. The left ventricle has no regional wall motion abnormalities. The left ventricular internal cavity size was normal in size. There is  mild asymmetric left ventricular hypertrophy of the basal-septal segment. Left ventricular diastolic parameters were normal. Right Ventricle: The right ventricular size is normal. Right vetricular wall thickness was not assessed. Right ventricular systolic function was not well visualized. There is normal pulmonary artery systolic pressure. The tricuspid regurgitant velocity is 2.49 m/s, and with an assumed right atrial pressure of 3 mmHg, the estimated right ventricular systolic pressure is 96.2 mmHg. Left Atrium: Left atrial size was normal in size. Right Atrium: Right atrial size was normal in size. Pericardium: There is no evidence  of pericardial effusion. Mitral Valve: The mitral valve is normal in structure. No evidence of mitral valve regurgitation. Tricuspid Valve: The tricuspid valve is normal in structure. Tricuspid valve regurgitation is trivial. Aortic Valve: The aortic valve was not well visualized. Aortic valve regurgitation is not visualized. No aortic stenosis is present. Pulmonic Valve: The pulmonic valve was not well visualized. Pulmonic valve regurgitation is not visualized. Aorta: The aortic root and ascending aorta are structurally normal, with no evidence of dilitation. Venous: The inferior vena cava is normal in size with greater than 50% respiratory variability, suggesting right atrial pressure of 3 mmHg. IAS/Shunts: The interatrial septum was not well visualized.  LEFT VENTRICLE PLAX 2D LVIDd:         3.80 cm  Diastology LVIDs:         2.50 cm  LV e' lateral:   9.68 cm/s LV PW:         0.90 cm  LV E/e' lateral: 7.3 LV IVS:        1.30 cm  LV e' medial:    7.83 cm/s LVOT  diam:     1.90 cm  LV E/e' medial:  9.0 LV SV:         82 LV SV Index:   47 LVOT Area:     2.84 cm  RIGHT VENTRICLE RV S prime:     14.00 cm/s TAPSE (M-mode): 2.5 cm LEFT ATRIUM             Index       RIGHT ATRIUM           Index LA diam:        2.30 cm 1.32 cm/m  RA Area:     10.90 cm LA Vol (A2C):   31.6 ml 18.11 ml/m RA Volume:   22.30 ml  12.78 ml/m LA Vol (A4C):   20.4 ml 11.69 ml/m LA Biplane Vol: 26.6 ml 15.25 ml/m  AORTIC VALVE LVOT Vmax:   133.00 cm/s LVOT Vmean:  89.100 cm/s LVOT VTI:    0.288 m  AORTA Ao Root diam: 3.10 cm Ao Asc diam:  3.10 cm MITRAL VALVE               TRICUSPID VALVE MV Area (PHT): 2.76 cm    TR Peak grad:   24.8 mmHg MV Decel Time: 275 msec    TR Vmax:        249.00 cm/s MV E velocity: 70.40 cm/s MV A velocity: 82.50 cm/s  SHUNTS MV E/A ratio:  0.85        Systemic VTI:  0.29 m                            Systemic Diam: 1.90 cm Oswaldo Milian MD Electronically signed by Oswaldo Milian MD Signature Date/Time: 10/04/2019/12:42:48 PM    Final    (Echo, Carotid, EGD, Colonoscopy, ERCP)    Subjective: Patient seen and examined.  No overnight events.  Pain is controlled with ibuprofen.  Right hand is swollen, however she is able to move some.  She is excited to go to rehab.   Discharge Exam: Vitals:   10/07/19 0311 10/07/19 0827  BP: (!) 151/81 (!) 152/80  Pulse: 81 82  Resp: 14 20  Temp: 98.3 F (36.8 C) 97.7 F (36.5 C)  SpO2: 98% 100%   Vitals:   10/06/19 1947 10/06/19 2332 10/07/19 0311 10/07/19 0827  BP: 136/64 (!) 151/68 (!) 151/81 Marland Kitchen)  152/80  Pulse: 79 80 81 82  Resp: 15 16 14 20   Temp: 98 F (36.7 C) 98.2 F (36.8 C) 98.3 F (36.8 C) 97.7 F (36.5 C)  TempSrc: Oral Oral Oral Oral  SpO2: 98% 97% 98% 100%  Weight:      Height:        General: Pt is alert, awake, not in acute distress Cardiovascular: RRR, S1/S2 +, no rubs, no gallops Respiratory: CTA bilaterally, no wheezing, no rhonchi Abdominal: Soft, NT, ND, bowel sounds  + Extremities: Edema of the right arm and forearm.  No erythema.  She has few blistering lesions with no obvious evidence of infection. Cranial nerves no deficits. Right upper and lower extremity, 3+/5.  Left side is normal.  Her right hand is difficult to use because of the swelling.    The results of significant diagnostics from this hospitalization (including imaging, microbiology, ancillary and laboratory) are listed below for reference.     Microbiology: Recent Results (from the past 240 hour(s))  SARS Coronavirus 2 by RT PCR (hospital order, performed in [facility] hospital lab) Nasopharyngeal Nasopharyngeal Swab     Status: None   Collection Time: 10/01/19  5:51 PM   Specimen: Nasopharyngeal Swab  Result Value Ref Range Status   SARS Coronavirus 2 NEGATIVE NEGATIVE Final    Comment: (NOTE) SARS-CoV-2 target nucleic acids are NOT DETECTED. The SARS-CoV-2 RNA is generally detectable in upper and lower respiratory specimens during the acute phase of infection. The lowest concentration of SARS-CoV-2 viral copies this assay can detect is 250 copies / mL. A negative result does not preclude SARS-CoV-2 infection and should not be used as the sole basis for treatment or other patient management decisions.  A negative result may occur with improper specimen collection / handling, submission of specimen other than nasopharyngeal swab, presence of viral mutation(s) within the areas targeted by this assay, and inadequate number of viral copies (<250 copies / mL). A negative result must be combined with clinical observations, patient history, and epidemiological information. Fact Sheet for Patients:   StrictlyIdeas.no Fact Sheet for Healthcare Providers: BankingDealers.co.za This test is not yet approved or cleared  by the [place] FDA and has been authorized for detection and/or diagnosis of SARS-CoV-2 by FDA under an Emergency Use  Authorization (EUA).  This EUA will remain in effect (meaning this test can be used) for the duration of the COVID-19 declaration under Section 564(b)(1) of the Act, 21 U.S.C. section 360bbb-3(b)(1), unless the authorization is terminated or revoked sooner. Performed at [facility], [place] [address], [place], [place] [address]   Culture, blood (routine x 2)     Status: Abnormal   Collection Time: 10/02/19  1:15 AM   Specimen: BLOOD  Result Value Ref Range Status   Specimen Description BLOOD RIGHT ARM  Final   Special Requests   Final    BOTTLES DRAWN AEROBIC AND ANAEROBIC Blood Culture results may not be optimal due to an inadequate volume of blood received in culture bottles   Culture  Setup Time   Final    GRAM POSITIVE COCCI IN CLUSTERS IN BOTH AEROBIC AND ANAEROBIC BOTTLES CRITICAL VALUE NOTED.  VALUE IS CONSISTENT WITH PREVIOUSLY REPORTED AND CALLED VALUE.    Culture (A)  Final    STAPHYLOCOCCUS HOMINIS SUSCEPTIBILITIES PERFORMED ON PREVIOUS CULTURE WITHIN THE LAST 5 DAYS. Performed at [facility], [place] [address] [place], [place] [address]    Report Status 10/04/2019 FINAL  Final  Culture, blood (routine x 2)     Status: Abnormal   Collection Time: 10/02/19  1:15 AM   Specimen: BLOOD  Result Value Ref Range Status   Specimen Description BLOOD LEFT ARM  Final   Special Requests   Final    BOTTLES DRAWN AEROBIC AND ANAEROBIC Blood Culture results may not be optimal due to an inadequate volume of blood received in culture bottles   Culture  Setup Time   Final    GRAM POSITIVE COCCI IN CLUSTERS AEROBIC BOTTLE ONLY CRITICAL RESULT CALLED TO, READ BACK BY AND VERIFIED WITH: PHARMD A MEYER AT 2041 10/02/19 BY L BENFIELD Performed at [facility], [place] [address] [place], [place] [address]    Culture STAPHYLOCOCCUS HOMINIS (A)  Final   Report Status 10/04/2019 FINAL  Final   Organism ID, Bacteria STAPHYLOCOCCUS HOMINIS  Final      Susceptibility    Staphylococcus hominis - MIC*    CIPROFLOXACIN <=0.5 SENSITIVE Sensitive     ERYTHROMYCIN >=8 RESISTANT Resistant     GENTAMICIN <=0.5 SENSITIVE Sensitive     OXACILLIN >=4 RESISTANT Resistant     TETRACYCLINE 4 SENSITIVE Sensitive     VANCOMYCIN <=0.5 SENSITIVE Sensitive     TRIMETH/SULFA 80 RESISTANT Resistant     CLINDAMYCIN <=0.25 SENSITIVE Sensitive     RIFAMPIN <=0.5 SENSITIVE Sensitive     Inducible Clindamycin NEGATIVE Sensitive     * STAPHYLOCOCCUS HOMINIS  Blood Culture ID Panel (Reflexed)     Status: Abnormal   Collection Time: 10/02/19  1:15 AM  Result Value Ref Range Status   Enterococcus species NOT DETECTED NOT DETECTED Final   Listeria monocytogenes NOT DETECTED NOT DETECTED Final   Staphylococcus species DETECTED (A) NOT DETECTED Final    Comment: Methicillin (oxacillin) resistant coagulase negative staphylococcus. Possible blood culture contaminant (unless isolated from more than one blood culture draw or clinical case suggests pathogenicity). No antibiotic treatment is indicated for blood  culture contaminants. CRITICAL RESULT CALLED TO, READ BACK BY AND VERIFIED WITH: PHARMD A MEYER AT 2041 10/02/19 BY L BENFIELD    Staphylococcus aureus (BCID) NOT DETECTED NOT DETECTED Final   Methicillin resistance DETECTED (A) NOT DETECTED Final    Comment: CRITICAL RESULT CALLED TO, READ BACK BY AND VERIFIED WITH: PHARMD A MEYER AT 2041 10/02/19 BY L BENFIELD    Streptococcus species NOT DETECTED NOT DETECTED Final   Streptococcus agalactiae NOT DETECTED NOT DETECTED Final   Streptococcus pneumoniae NOT DETECTED NOT DETECTED Final   Streptococcus pyogenes NOT DETECTED NOT DETECTED Final   Acinetobacter baumannii NOT DETECTED NOT DETECTED Final   Enterobacteriaceae species NOT DETECTED NOT DETECTED Final   Enterobacter cloacae complex NOT DETECTED NOT DETECTED Final   Escherichia coli NOT DETECTED NOT DETECTED Final   Klebsiella oxytoca NOT DETECTED NOT DETECTED Final    Klebsiella pneumoniae NOT DETECTED NOT DETECTED Final   Proteus species NOT DETECTED NOT DETECTED Final   Serratia marcescens NOT DETECTED NOT DETECTED Final   Haemophilus influenzae NOT DETECTED NOT DETECTED Final   Neisseria meningitidis NOT DETECTED NOT DETECTED Final   Pseudomonas aeruginosa NOT DETECTED NOT DETECTED Final   Candida albicans NOT DETECTED NOT DETECTED Final   Candida glabrata NOT DETECTED NOT DETECTED Final   Candida krusei NOT DETECTED NOT DETECTED Final   Candida parapsilosis NOT DETECTED NOT DETECTED Final   Candida tropicalis NOT DETECTED NOT DETECTED Final    Comment: Performed at [facility], [place]. [address] [place], [place]  [address]  CSF culture     Status: None   Collection Time: 10/02/19  2:46 AM   Specimen: CSF; Cerebrospinal Fluid  Result Value Ref Range Status   Specimen Description CSF  Final   Special Requests NONE  Final   Gram Stain   Final    CYTOSPIN SMEAR WBC PRESENT,BOTH PMN AND MONONUCLEAR NO ORGANISMS SEEN    Culture   Final    NO GROWTH 3 DAYS Performed at [facility], [place] [address] [place], [place] [address]    Report Status 10/05/2019 FINAL  Final  Culture, fungus without smear     Status: None (Preliminary result)   Collection Time: 10/02/19  2:47 AM   Specimen: CSF; Cerebrospinal Fluid  Result Value Ref Range Status   Specimen Description CSF  Final   Special Requests NONE  Final   Culture   Final    NO FUNGUS ISOLATED AFTER 5 DAYS Performed at [facility], [place] [address], [place], [place] [address]    Report Status PENDING  Incomplete  Culture, Urine     Status: Abnormal   Collection Time: 10/02/19  1:00 PM   Specimen: Urine, Random  Result Value Ref Range Status   Specimen Description URINE, RANDOM  Final   Special Requests   Final    NONE Performed at [facility], [place] [address], [place], [place] [address]    Culture >=100,000 COLONIES/mL ESCHERICHIA COLI (A)  Final   Report Status  10/04/2019 FINAL  Final   Organism ID, Bacteria ESCHERICHIA COLI (A)  Final      Susceptibility   Escherichia coli - MIC*    AMPICILLIN <=2 SENSITIVE Sensitive     CEFAZOLIN <=4 SENSITIVE Sensitive     CEFTRIAXONE <=1 SENSITIVE Sensitive     CIPROFLOXACIN <=0.25 SENSITIVE Sensitive     GENTAMICIN <=1 SENSITIVE Sensitive     IMIPENEM <=0.25 SENSITIVE Sensitive     NITROFURANTOIN <=16 SENSITIVE Sensitive     TRIMETH/SULFA <=20 SENSITIVE Sensitive     AMPICILLIN/SULBACTAM <=2 SENSITIVE Sensitive     PIP/TAZO <=4 SENSITIVE Sensitive     * >=100,000 COLONIES/mL ESCHERICHIA COLI  Culture, blood (routine x 2)     Status: None (Preliminary result)   Collection Time: 10/04/19 12:01 AM   Specimen: BLOOD  Result Value Ref Range Status   Specimen Description BLOOD LEFT ARM  Final   Special Requests   Final    BOTTLES DRAWN AEROBIC ONLY Blood Culture adequate volume   Culture   Final    NO GROWTH 3 DAYS Performed at [facility], [address], [place], [place] [address]    Report Status PENDING  Incomplete  Culture, blood (routine x 2)     Status: None (Preliminary result)   Collection Time: 10/04/19 12:04 AM   Specimen: BLOOD  Result Value Ref Range Status   Specimen Description BLOOD RIGHT ARM  Final   Special Requests   Final    BOTTLES DRAWN AEROBIC ONLY Blood Culture results may not be optimal due to an inadequate volume of blood received in culture bottles   Culture   Final    NO GROWTH 3 DAYS Performed at [facility], [place] [address] [place], [place] [address]    Report Status PENDING  Incomplete     Labs: BNP (last 3 results) No results for input(s): BNP in the last 8760 hours. Basic Metabolic Panel: Recent Labs  Lab 10/01/19 1729 10/01/19  1729 10/01/19 1801 10/02/19 0518 10/03/19 2350 10/05/19 0358 10/06/19 0505 10/07/19 0745  NA 145   < > 148* 150* 142 142 141  --   K 5.2*   < > 4.9 4.6 3.8 4.4 3.9  --   CL 116*   < > 117* 122* 114* 111 110  --    CO2 18*  --   --  19* 20* 25 25  --   GLUCOSE 142*   < > 142* 140* 99 92 107*  --   BUN 52*   < > 61* 37* 9 6* 9  --   CREATININE 1.18*   < > 1.30* 0.93 0.59 0.58 0.55  --   CALCIUM 9.5  --   --  9.3 8.3* 8.6* 8.3*  --   MG  --   --   --   --  2.0 1.9 1.8 1.8  PHOS  --   --   --   --  1.9*  --   --   --    < > = values in this interval not displayed.   Liver Function Tests: Recent Labs  Lab 10/01/19 1729 10/02/19 0518 10/03/19 2350 10/05/19 0358 10/06/19 0505  AST 379* 362* 146* 127* 92*  ALT 153* 159* 89* 89* 79*  ALKPHOS 71 67 47 47 46  BILITOT 0.8 0.8 <0.1* 0.6 0.4  PROT 8.8* 7.3 5.6* 5.4* 5.1*  ALBUMIN 3.7 3.0* 2.3* 2.2* 2.0*   No results for input(s): LIPASE, AMYLASE in the last 168 hours. Recent Labs  Lab 10/02/19 0055  AMMONIA 24   CBC: Recent Labs  Lab 10/01/19 1729 10/01/19 1801 10/02/19 0518 10/03/19 2350 10/05/19 0358 10/06/19 0505 10/07/19 0745  WBC 13.9*   < > 16.4* 9.4 9.8 10.3 10.3  NEUTROABS 11.6*  --   --  5.2 5.6 6.1 6.7  HGB 16.0*   < > 15.6* 11.3* 12.7 12.1 12.5  HCT 53.5*   < > 53.6* 38.3 43.2 40.7 42.3  MCV 75.7*   < > 77.0* 77.1* 76.9* 76.2* 76.8*  PLT 475*   < > 409* 230 245 261 289   < > = values in this interval not displayed.   Cardiac Enzymes: Recent Labs  Lab 10/03/19 0315 10/03/19 2350 10/05/19 0358 10/06/19 0505 10/07/19 0745  CKTOTAL 5,699* 4,362* 3,355* 1,895* 1,180*   BNP: Invalid input(s): POCBNP CBG: Recent Labs  Lab 10/01/19 1714  GLUCAP 150*   D-Dimer No results for input(s): DDIMER in the last 72 hours. Hgb A1c No results for input(s): HGBA1C in the last 72 hours. Lipid Profile No results for input(s): CHOL, HDL, LDLCALC, TRIG, CHOLHDL, LDLDIRECT in the last 72 hours. Thyroid function studies No results for input(s): TSH, T4TOTAL, T3FREE, THYROIDAB in the last 72 hours.  Invalid input(s): FREET3 Anemia work up No results for input(s): VITAMINB12, FOLATE, FERRITIN, TIBC, IRON, RETICCTPCT in the last  72 hours. Urinalysis    Component Value Date/Time   COLORURINE AMBER (A) 10/01/2019 1731   APPEARANCEUR CLOUDY (A) 10/01/2019 1731   LABSPEC 1.018 10/01/2019 1731   PHURINE 6.0 10/01/2019 1731   GLUCOSEU NEGATIVE 10/01/2019 1731   HGBUR LARGE (A) 10/01/2019 1731   BILIRUBINUR NEGATIVE 10/01/2019 1731   KETONESUR 20 (A) 10/01/2019 1731   PROTEINUR 100 (A) 10/01/2019 1731   UROBILINOGEN 0.2 10/26/2018 1355   NITRITE NEGATIVE 10/01/2019 1731   LEUKOCYTESUR MODERATE (A) 10/01/2019 1731   Sepsis Labs Invalid input(s): PROCALCITONIN,  WBC,  LACTICIDVEN Microbiology Recent Results (from the  past 240 hour(s))  SARS Coronavirus 2 by RT PCR (hospital order, performed in [facility] hospital lab) Nasopharyngeal Nasopharyngeal Swab     Status: None   Collection Time: 10/01/19  5:51 PM   Specimen: Nasopharyngeal Swab  Result Value Ref Range Status   SARS Coronavirus 2 NEGATIVE NEGATIVE Final    Comment: (NOTE) SARS-CoV-2 target nucleic acids are NOT DETECTED. The SARS-CoV-2 RNA is generally detectable in upper and lower respiratory specimens during the acute phase of infection. The lowest concentration of SARS-CoV-2 viral copies this assay can detect is 250 copies / mL. A negative result does not preclude SARS-CoV-2 infection and should not be used as the sole basis for treatment or other patient management decisions.  A negative result may occur with improper specimen collection / handling, submission of specimen other than nasopharyngeal swab, presence of viral mutation(s) within the areas targeted by this assay, and inadequate number of viral copies (<250 copies / mL). A negative result must be combined with clinical observations, patient history, and epidemiological information. Fact Sheet for Patients:   StrictlyIdeas.no Fact Sheet for Healthcare Providers: BankingDealers.co.za This test is not yet approved or cleared  by the [place] FDA and has been authorized for detection and/or diagnosis of SARS-CoV-2 by FDA under an Emergency Use Authorization (EUA).  This EUA will remain in effect (meaning this test can be used) for the duration of the COVID-19 declaration under Section 564(b)(1) of the Act, 21 U.S.C. section 360bbb-3(b)(1), unless the authorization is terminated or revoked sooner. Performed at [facility], [place] [address] [place], [place] [address]   Culture, blood (routine x 2)     Status: Abnormal   Collection Time: 10/02/19  1:15 AM   Specimen: BLOOD  Result Value Ref Range Status   Specimen Description BLOOD RIGHT ARM  Final   Special Requests   Final    BOTTLES DRAWN AEROBIC AND ANAEROBIC Blood Culture results may not be optimal due to an inadequate volume of blood received in culture bottles   Culture  Setup Time   Final    GRAM POSITIVE COCCI IN CLUSTERS IN BOTH AEROBIC AND ANAEROBIC BOTTLES CRITICAL VALUE NOTED.  VALUE IS CONSISTENT WITH PREVIOUSLY REPORTED AND CALLED VALUE.    Culture (A)  Final    STAPHYLOCOCCUS HOMINIS SUSCEPTIBILITIES PERFORMED ON PREVIOUS CULTURE WITHIN THE LAST 5 DAYS. Performed at [facility], [place] [address] [place], [place] [address]    Report Status 10/04/2019 FINAL  Final  Culture, blood (routine x 2)     Status: Abnormal   Collection Time: 10/02/19  1:15 AM   Specimen: BLOOD  Result Value Ref Range Status   Specimen Description BLOOD LEFT ARM  Final   Special Requests   Final    BOTTLES DRAWN AEROBIC AND ANAEROBIC Blood Culture results may not be optimal due to an inadequate volume of blood received in culture bottles   Culture  Setup Time   Final    GRAM POSITIVE COCCI IN CLUSTERS AEROBIC BOTTLE ONLY CRITICAL RESULT CALLED TO, READ BACK BY AND VERIFIED WITH: PHARMD A MEYER AT 2041 10/02/19 BY L BENFIELD Performed at [facility], [place] [address] [place], [place] [address]    Culture STAPHYLOCOCCUS HOMINIS (A)  Final   Report Status  10/04/2019 FINAL  Final   Organism ID, Bacteria STAPHYLOCOCCUS HOMINIS  Final      Susceptibility   Staphylococcus hominis - MIC*    CIPROFLOXACIN <=0.5 SENSITIVE Sensitive  ERYTHROMYCIN >=8 RESISTANT Resistant     GENTAMICIN <=0.5 SENSITIVE Sensitive     OXACILLIN >=4 RESISTANT Resistant     TETRACYCLINE 4 SENSITIVE Sensitive     VANCOMYCIN <=0.5 SENSITIVE Sensitive     TRIMETH/SULFA 80 RESISTANT Resistant     CLINDAMYCIN <=0.25 SENSITIVE Sensitive     RIFAMPIN <=0.5 SENSITIVE Sensitive     Inducible Clindamycin NEGATIVE Sensitive     * STAPHYLOCOCCUS HOMINIS  Blood Culture ID Panel (Reflexed)     Status: Abnormal   Collection Time: 10/02/19  1:15 AM  Result Value Ref Range Status   Enterococcus species NOT DETECTED NOT DETECTED Final   Listeria monocytogenes NOT DETECTED NOT DETECTED Final   Staphylococcus species DETECTED (A) NOT DETECTED Final    Comment: Methicillin (oxacillin) resistant coagulase negative staphylococcus. Possible blood culture contaminant (unless isolated from more than one blood culture draw or clinical case suggests pathogenicity). No antibiotic treatment is indicated for blood  culture contaminants. CRITICAL RESULT CALLED TO, READ BACK BY AND VERIFIED WITH: PHARMD A MEYER AT 2041 10/02/19 BY L BENFIELD    Staphylococcus aureus (BCID) NOT DETECTED NOT DETECTED Final   Methicillin resistance DETECTED (A) NOT DETECTED Final    Comment: CRITICAL RESULT CALLED TO, READ BACK BY AND VERIFIED WITH: PHARMD A MEYER AT 2041 10/02/19 BY L BENFIELD    Streptococcus species NOT DETECTED NOT DETECTED Final   Streptococcus agalactiae NOT DETECTED NOT DETECTED Final   Streptococcus pneumoniae NOT DETECTED NOT DETECTED Final   Streptococcus pyogenes NOT DETECTED NOT DETECTED Final   Acinetobacter baumannii NOT DETECTED NOT DETECTED Final   Enterobacteriaceae species NOT DETECTED NOT DETECTED Final   Enterobacter cloacae complex NOT DETECTED NOT DETECTED Final    Escherichia coli NOT DETECTED NOT DETECTED Final   Klebsiella oxytoca NOT DETECTED NOT DETECTED Final   Klebsiella pneumoniae NOT DETECTED NOT DETECTED Final   Proteus species NOT DETECTED NOT DETECTED Final   Serratia marcescens NOT DETECTED NOT DETECTED Final   Haemophilus influenzae NOT DETECTED NOT DETECTED Final   Neisseria meningitidis NOT DETECTED NOT DETECTED Final   Pseudomonas aeruginosa NOT DETECTED NOT DETECTED Final   Candida albicans NOT DETECTED NOT DETECTED Final   Candida glabrata NOT DETECTED NOT DETECTED Final   Candida krusei NOT DETECTED NOT DETECTED Final   Candida parapsilosis NOT DETECTED NOT DETECTED Final   Candida tropicalis NOT DETECTED NOT DETECTED Final    Comment: Performed at [facility], [place]. [address], [place], [place] [address]  CSF culture     Status: None   Collection Time: 10/02/19  2:46 AM   Specimen: CSF; Cerebrospinal Fluid  Result Value Ref Range Status   Specimen Description CSF  Final   Special Requests NONE  Final   Gram Stain   Final    CYTOSPIN SMEAR WBC PRESENT,BOTH PMN AND MONONUCLEAR NO ORGANISMS SEEN    Culture   Final    NO GROWTH 3 DAYS Performed at [facility], [place] [address] [place], [place] [address]    Report Status 10/05/2019 FINAL  Final  Culture, fungus without smear     Status: None (Preliminary result)   Collection Time: 10/02/19  2:47 AM   Specimen: CSF; Cerebrospinal Fluid  Result Value Ref Range Status   Specimen Description CSF  Final   Special Requests NONE  Final   Culture   Final    NO FUNGUS ISOLATED AFTER 5 DAYS Performed at [facility], [place],  [place] [address]    Report Status PENDING  Incomplete  Culture, Urine     Status: Abnormal   Collection Time: 10/02/19  1:00 PM   Specimen: Urine, Random  Result Value Ref Range Status   Specimen Description URINE, RANDOM  Final   Special Requests   Final    NONE Performed at [facility], [place] [address],  [place], [place] [address]    Culture >=100,000 COLONIES/mL ESCHERICHIA COLI (A)  Final   Report Status 10/04/2019 FINAL  Final   Organism ID, Bacteria ESCHERICHIA COLI (A)  Final      Susceptibility   Escherichia coli - MIC*    AMPICILLIN <=2 SENSITIVE Sensitive     CEFAZOLIN <=4 SENSITIVE Sensitive     CEFTRIAXONE <=1 SENSITIVE Sensitive     CIPROFLOXACIN <=0.25 SENSITIVE Sensitive     GENTAMICIN <=1 SENSITIVE Sensitive     IMIPENEM <=0.25 SENSITIVE Sensitive     NITROFURANTOIN <=16 SENSITIVE Sensitive     TRIMETH/SULFA <=20 SENSITIVE Sensitive     AMPICILLIN/SULBACTAM <=2 SENSITIVE Sensitive     PIP/TAZO <=4 SENSITIVE Sensitive     * >=100,000 COLONIES/mL ESCHERICHIA COLI  Culture, blood (routine x 2)     Status: None (Preliminary result)   Collection Time: 10/04/19 12:01 AM   Specimen: BLOOD  Result Value Ref Range Status   Specimen Description BLOOD LEFT ARM  Final   Special Requests   Final    BOTTLES DRAWN AEROBIC ONLY Blood Culture adequate volume   Culture   Final    NO GROWTH 3 DAYS Performed at [facility], [address] [place], [place] [address]    Report Status PENDING  Incomplete  Culture, blood (routine x 2)     Status: None (Preliminary result)   Collection Time: 10/04/19 12:04 AM   Specimen: BLOOD  Result Value Ref Range Status   Specimen Description BLOOD RIGHT ARM  Final   Special Requests   Final    BOTTLES DRAWN AEROBIC ONLY Blood Culture results may not be optimal due to an inadequate volume of blood received in culture bottles   Culture   Final    NO GROWTH 3 DAYS Performed at [facility], [place] [address], [place], [place] [address]    Report Status PENDING  Incomplete     Time coordinating discharge: 40 minutes  SIGNED:   Barb Merino, MD  [facility] 10/07/2019, 11:34 AM

## 2019-10-09 LAB — CULTURE, BLOOD (ROUTINE X 2)
Culture: NO GROWTH
Culture: NO GROWTH
Special Requests: ADEQUATE

## 2019-10-23 LAB — CULTURE, FUNGUS WITHOUT SMEAR

## 2020-04-18 ENCOUNTER — Ambulatory Visit: Payer: Medicaid Other | Admitting: Family Medicine

## 2020-05-12 ENCOUNTER — Emergency Department (HOSPITAL_COMMUNITY)
Admission: EM | Admit: 2020-05-12 | Discharge: 2020-05-15 | Disposition: A | Discharge status: Home / Self Care | Payer: Medicaid Other | Attending: Emergency Medicine | Admitting: Emergency Medicine

## 2020-05-12 ENCOUNTER — Other Ambulatory Visit: Payer: Self-pay

## 2020-05-12 ENCOUNTER — Emergency Department (HOSPITAL_COMMUNITY): Payer: Medicaid Other

## 2020-05-12 ENCOUNTER — Encounter (HOSPITAL_COMMUNITY): Payer: Self-pay | Admitting: Emergency Medicine

## 2020-05-12 DIAGNOSIS — F489 Nonpsychotic mental disorder, unspecified: Secondary | ICD-10-CM

## 2020-05-12 DIAGNOSIS — J45909 Unspecified asthma, uncomplicated: Secondary | ICD-10-CM | POA: Diagnosis not present

## 2020-05-12 DIAGNOSIS — F250 Schizoaffective disorder, bipolar type: Secondary | ICD-10-CM | POA: Insufficient documentation

## 2020-05-12 DIAGNOSIS — F1490 Cocaine use, unspecified, uncomplicated: Secondary | ICD-10-CM | POA: Insufficient documentation

## 2020-05-12 DIAGNOSIS — F99 Mental disorder, not otherwise specified: Secondary | ICD-10-CM | POA: Insufficient documentation

## 2020-05-12 DIAGNOSIS — Z20822 Contact with and (suspected) exposure to covid-19: Secondary | ICD-10-CM | POA: Diagnosis not present

## 2020-05-12 DIAGNOSIS — Z7982 Long term (current) use of aspirin: Secondary | ICD-10-CM | POA: Diagnosis not present

## 2020-05-12 DIAGNOSIS — R4689 Other symptoms and signs involving appearance and behavior: Secondary | ICD-10-CM

## 2020-05-12 DIAGNOSIS — F319 Bipolar disorder, unspecified: Secondary | ICD-10-CM | POA: Insufficient documentation

## 2020-05-12 DIAGNOSIS — R456 Violent behavior: Secondary | ICD-10-CM | POA: Insufficient documentation

## 2020-05-12 DIAGNOSIS — F209 Schizophrenia, unspecified: Secondary | ICD-10-CM | POA: Insufficient documentation

## 2020-05-12 DIAGNOSIS — F17210 Nicotine dependence, cigarettes, uncomplicated: Secondary | ICD-10-CM | POA: Insufficient documentation

## 2020-05-12 LAB — CBC
HCT: 44.7 % (ref 36.0–46.0)
Hemoglobin: 13.4 g/dL (ref 12.0–15.0)
MCH: 22.4 pg — ABNORMAL LOW (ref 26.0–34.0)
MCHC: 30 g/dL (ref 30.0–36.0)
MCV: 74.7 fL — ABNORMAL LOW (ref 80.0–100.0)
Platelets: 320 10*3/uL (ref 150–400)
RBC: 5.98 MIL/uL — ABNORMAL HIGH (ref 3.87–5.11)
RDW: 15.8 % — ABNORMAL HIGH (ref 11.5–15.5)
WBC: 6 10*3/uL (ref 4.0–10.5)
nRBC: 0 % (ref 0.0–0.2)

## 2020-05-12 LAB — COMPREHENSIVE METABOLIC PANEL
ALT: 28 U/L (ref 0–44)
AST: 24 U/L (ref 15–41)
Albumin: 4.1 g/dL (ref 3.5–5.0)
Alkaline Phosphatase: 62 U/L (ref 38–126)
Anion gap: 10 (ref 5–15)
BUN: 6 mg/dL — ABNORMAL LOW (ref 8–23)
CO2: 24 mmol/L (ref 22–32)
Calcium: 10.1 mg/dL (ref 8.9–10.3)
Chloride: 105 mmol/L (ref 98–111)
Creatinine, Ser: 0.7 mg/dL (ref 0.44–1.00)
GFR, Estimated: >60 mL/min (ref >60)
Glucose, Bld: 111 mg/dL — ABNORMAL HIGH (ref 70–99)
Potassium: 4.2 mmol/L (ref 3.5–5.1)
Sodium: 139 mmol/L (ref 135–145)
Total Bilirubin: 0.8 mg/dL (ref 0.3–1.2)
Total Protein: 8.5 g/dL — ABNORMAL HIGH (ref 6.5–8.1)

## 2020-05-12 LAB — URINALYSIS, ROUTINE W REFLEX MICROSCOPIC
Bilirubin Urine: NEGATIVE
Glucose, UA: NEGATIVE mg/dL
Hgb urine dipstick: NEGATIVE
Ketones, ur: NEGATIVE mg/dL
Nitrite: POSITIVE — AB
Protein, ur: NEGATIVE mg/dL
Specific Gravity, Urine: 1.005 (ref 1.005–1.030)
pH: 7 (ref 5.0–8.0)

## 2020-05-12 LAB — ACETAMINOPHEN LEVEL: Acetaminophen (Tylenol), Serum: <10 ug/mL — ABNORMAL LOW (ref 10–30)

## 2020-05-12 LAB — RESP PANEL BY RT-PCR (FLU A&B, COVID) ARPGX2
Influenza A by PCR: NEGATIVE
Influenza B by PCR: NEGATIVE
SARS Coronavirus 2 by RT PCR: NEGATIVE

## 2020-05-12 LAB — ETHANOL: Alcohol, Ethyl (B): <10 mg/dL (ref <10)

## 2020-05-12 LAB — RAPID URINE DRUG SCREEN, HOSP PERFORMED
Amphetamines: NOT DETECTED
Barbiturates: NOT DETECTED
Benzodiazepines: NOT DETECTED
Cocaine: NOT DETECTED
Opiates: NOT DETECTED
Tetrahydrocannabinol: NOT DETECTED

## 2020-05-12 LAB — SALICYLATE LEVEL: Salicylate Lvl: <7 mg/dL — ABNORMAL LOW (ref 7.0–30.0)

## 2020-05-12 MED ORDER — FAMOTIDINE 20 MG PO TABS
40.0000 mg | ORAL_TABLET | Freq: Two times a day (BID) | ORAL | Status: DC
  Administered 2020-05-13 – 2020-05-14 (×4): 40 mg via ORAL
  Filled 2020-05-12 (×4): qty 2

## 2020-05-12 MED ORDER — LORAZEPAM 2 MG/ML IJ SOLN
2.0000 mg | Freq: Once | INTRAMUSCULAR | Status: AC
  Administered 2020-05-12: 2 mg via INTRAVENOUS
  Filled 2020-05-12: qty 1

## 2020-05-12 MED ORDER — LORATADINE 10 MG PO TABS
10.0000 mg | ORAL_TABLET | Freq: Every day | ORAL | Status: DC
  Administered 2020-05-13 – 2020-05-14 (×2): 10 mg via ORAL
  Filled 2020-05-12 (×2): qty 1

## 2020-05-12 MED ORDER — VALBENAZINE TOSYLATE 80 MG PO CAPS: 40.0000 mg | ORAL_CAPSULE | Freq: Every day | ORAL | Status: DC

## 2020-05-12 MED ORDER — ACETAMINOPHEN 325 MG PO TABS: 650.0000 mg | ORAL_TABLET | Freq: Four times a day (QID) | ORAL | Status: DC | PRN

## 2020-05-12 MED ORDER — HYDROXYZINE HCL 50 MG PO TABS
50.0000 mg | ORAL_TABLET | Freq: Every day | ORAL | Status: DC
  Administered 2020-05-13 – 2020-05-14 (×2): 50 mg via ORAL
  Filled 2020-05-12: qty 2
  Filled 2020-05-12: qty 1

## 2020-05-12 MED ORDER — ATORVASTATIN CALCIUM 40 MG PO TABS
40.0000 mg | ORAL_TABLET | Freq: Every day | ORAL | Status: DC
  Administered 2020-05-13 – 2020-05-14 (×2): 40 mg via ORAL
  Filled 2020-05-12 (×2): qty 4

## 2020-05-12 MED ORDER — ASPIRIN 81 MG PO CHEW
324.0000 mg | CHEWABLE_TABLET | Freq: Every day | ORAL | Status: DC
  Administered 2020-05-13 – 2020-05-14 (×2): 324 mg via ORAL
  Filled 2020-05-12 (×2): qty 4

## 2020-05-12 MED ORDER — CEPHALEXIN 250 MG PO CAPS
500.0000 mg | ORAL_CAPSULE | Freq: Two times a day (BID) | ORAL | Status: DC
  Administered 2020-05-13 – 2020-05-14 (×4): 500 mg via ORAL
  Filled 2020-05-12 (×4): qty 2

## 2020-05-12 MED ORDER — STERILE WATER FOR INJECTION IJ SOLN
INTRAMUSCULAR | Status: AC
  Filled 2020-05-12: qty 10

## 2020-05-12 MED ORDER — OLANZAPINE 5 MG PO TABS: 10.0000 mg | ORAL_TABLET | Freq: Every day | ORAL | Status: DC

## 2020-05-12 MED ORDER — IBUPROFEN 400 MG PO TABS: 400.0000 mg | ORAL_TABLET | Freq: Four times a day (QID) | ORAL | Status: DC | PRN

## 2020-05-12 MED ORDER — ZIPRASIDONE MESYLATE 20 MG IM SOLR: 20.0000 mg | Freq: Once | INTRAMUSCULAR | Status: AC

## 2020-05-12 MED ORDER — ZIPRASIDONE MESYLATE 20 MG IM SOLR
INTRAMUSCULAR | Status: AC
  Administered 2020-05-12: 20 mg via INTRAMUSCULAR
  Filled 2020-05-12: qty 20

## 2020-05-12 NOTE — ED Notes (Signed)
Order for sitter placed

## 2020-05-12 NOTE — ED Notes (Signed)
Sleeping, no issues at this time.

## 2020-05-12 NOTE — ED Provider Notes (Signed)
[facility] EMERGENCY DEPARTMENT Provider Note   CSN: [id number] Arrival date & time: 05/12/20  1151     History Chief Complaint  Patient presents with  . Altered Mental Status    Karen Best is a 65 y.o. female.  Patient found wandering the street and EMS picked the patient up and brought him here per their request supposedly.  Patient with history of mental illness.  Has nonsensical thoughts and does not complain of any pain.  The history is provided by the patient.  Mental Health Problem Presenting symptoms: aggressive behavior, bizarre behavior and delusional   Degree of incapacity (severity):  Mild Onset quality:  Gradual Timing:  Unable to specify Progression:  Unable to specify Treatment compliance:  Unable to specify Relieved by:  Nothing Worsened by:  Nothing Associated symptoms: no abdominal pain and no chest pain   Risk factors: hx of mental illness        Past Medical History:  Diagnosis Date  . Asthma   . Bipolar 1 disorder ([place])   . Fibromyalgia   . Schizophrenia ([place])   . Tobacco abuse     Patient Active Problem List   Diagnosis Date Noted  . Bacteremia due to Staphylococcus aureus 10/03/2019  . Dehydration with hypernatremia 10/03/2019  . AKI (acute kidney injury) ([place]) 10/02/2019  . Transaminitis 10/02/2019  . CVA (cerebral vascular accident) ([place]) 10/02/2019  . AMS (altered mental status) 10/01/2019  . Lithium toxicity 02/16/2019  . Bipolar 1 disorder ([place]) 02/16/2019  . Hyperkalemia 02/16/2019  . Acute confusion 12/29/2018  . Toxic metabolic encephalopathy 75/64/3329  . Dysarthria 12/23/2018  . Elevated blood pressure reading 12/23/2018  . Asthma 12/23/2018  . Delirium due to another medical condition   . UTI (urinary tract infection) 12/06/2018  . Acute metabolic encephalopathy 51/88/4166  . 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 Psychosis ([place]) 09/16/2013  . Schizophrenia [facility])     Past Surgical  History:  Procedure Laterality Date  . CESAREAN SECTION    . TUBAL LIGATION       OB History   No obstetric history on file.     Family History  Problem Relation Age of Onset  . Liver cancer Neg Hx   . Liver disease Neg Hx     Social History   Tobacco Use  . Smoking status: Current Every Day Smoker    Packs/day: 0.50    Types: Cigarettes  . Smokeless tobacco: Never Used  Vaping Use  . Vaping Use: Never used  Substance Use Topics  . Alcohol use: Yes    Comment: occ  . Drug use: Yes    Types: Cocaine    Comment: crack cocaine (last use 10/25/2018)    Home Medications Prior to Admission medications   Medication Sig Start Date End Date Taking? Authorizing Provider  acetaminophen (TYLENOL) 325 MG tablet Take 650 mg by mouth every 6 (six) hours as needed for mild pain or moderate pain.    [provider]  aspirin 81 MG chewable tablet Chew 4 tablets (324 mg total) by mouth daily. 10/08/19   Barb Merino, MD  atorvastatin (LIPITOR) 40 MG tablet Take 1 tablet (40 mg total) by mouth daily. 10/07/19 10/06/20  Barb Merino, MD  famotidine (PEPCID) 40 MG tablet Take 40 mg by mouth 2 (two) times daily.    [provider]  hydrOXYzine (ATARAX/VISTARIL) 50 MG tablet Take 50 mg by mouth at bedtime.    [provider]  ibuprofen (  ADVIL) 200 MG tablet Take 400 mg by mouth every 6 (six) hours as needed for headache or mild pain.    [provider]  lithium carbonate (LITHOBID) 300 MG CR tablet Take 1 tablet (300 mg total) by mouth 2 (two) times daily. 02/17/19 03/19/19  Elodia Florence., MD  lithium carbonate (LITHOBID) 300 MG CR tablet Take 1 tablet (300 mg total) by mouth 2 (two) times daily. 10/07/19 11/06/19  Barb Merino, MD  loratadine (CLARITIN) 10 MG tablet Take 10 mg by mouth daily.    [provider]  OLANZapine (ZYPREXA) 10 MG tablet Take 10 mg by mouth daily.     [provider]  OLANZapine (ZYPREXA) 20 MG tablet Take  1 tablet (20 mg total) by mouth at bedtime. 12/31/18 10/08/19  Cherylann Ratel A, DO  Valbenazine Tosylate (INGREZZA) 80 MG CAPS Take 40 mg by mouth at bedtime. 10/07/19   Barb Merino, MD    Allergies    Patient has no known allergies.  Review of Systems   Review of Systems  Constitutional: Negative for chills and fever.  HENT: Negative for ear pain and sore throat.   Eyes: Negative for pain and visual disturbance.  Respiratory: Negative for cough and shortness of breath.   Cardiovascular: Negative for chest pain and palpitations.  Gastrointestinal: Negative for abdominal pain and vomiting.  Genitourinary: Negative for dysuria and hematuria.  Musculoskeletal: Negative for arthralgias and back pain.  Skin: Negative for color change and rash.  Neurological: Negative for seizures and syncope.  All other systems reviewed and are negative.   Physical Exam Updated Vital Signs BP (!) 192/157   Pulse (!) 103   Temp 98.4 F (36.9 C) (Oral)   Resp (!) 45   SpO2 100%   Physical Exam Vitals and nursing note reviewed.  Constitutional:      General: She is not in acute distress.    Appearance: She is well-developed and well-nourished.  HENT:     Head: Normocephalic and atraumatic.     Mouth/Throat:     Mouth: Mucous membranes are moist.  Eyes:     Extraocular Movements: Extraocular movements intact.     Conjunctiva/sclera: Conjunctivae normal.     Pupils: Pupils are equal, round, and reactive to light.  Cardiovascular:     Rate and Rhythm: Normal rate and regular rhythm.     Pulses: Normal pulses.     Heart sounds: Normal heart sounds. No murmur heard.   Pulmonary:     Effort: Pulmonary effort is normal. No respiratory distress.     Breath sounds: Normal breath sounds.  Abdominal:     Palpations: Abdomen is soft.     Tenderness: There is no abdominal tenderness.  Musculoskeletal:        General: No edema.     Cervical back: Neck supple.  Skin:    General: Skin is warm and  dry.  Neurological:     General: No focal deficit present.     Mental Status: She is alert and oriented to person, place, and time.     Cranial Nerves: No cranial nerve deficit.     Sensory: No sensory deficit.     Motor: No weakness.  Psychiatric:        Attention and Perception: She is inattentive.        Mood and Affect: Affect is labile.        Behavior: Behavior is aggressive.        Judgment: Judgment is  impulsive.     ED Results / Procedures / Treatments   Labs (all labs ordered are listed, but only abnormal results are displayed) Labs Reviewed  COMPREHENSIVE METABOLIC PANEL - Abnormal; Notable for the following components:      Result Value   Glucose, Bld 111 (*)    BUN 6 (*)    Total Protein 8.5 (*)    All other components within normal limits  CBC - Abnormal; Notable for the following components:   RBC 5.98 (*)    MCV 74.7 (*)    MCH 22.4 (*)    RDW 15.8 (*)    All other components within normal limits  SALICYLATE LEVEL - Abnormal; Notable for the following components:   Salicylate Lvl <5.4 (*)    All other components within normal limits  ACETAMINOPHEN LEVEL - Abnormal; Notable for the following components:   Acetaminophen (Tylenol), Serum <10 (*)    All other components within normal limits  URINALYSIS, ROUTINE W REFLEX MICROSCOPIC - Abnormal; Notable for the following components:   APPearance HAZY (*)    Nitrite POSITIVE (*)    Leukocytes,Ua SMALL (*)    Bacteria, UA RARE (*)    All other components within normal limits  RESP PANEL BY RT-PCR (FLU A&B, COVID) ARPGX2  URINE CULTURE  ETHANOL  RAPID URINE DRUG SCREEN, HOSP PERFORMED  CBG MONITORING, ED    EKG None  Radiology CT Head Wo Contrast  Result Date: 05/12/2020 CLINICAL DATA:  Altered mental status, found walking on the side of the road. EXAM: CT HEAD WITHOUT CONTRAST TECHNIQUE: Contiguous axial images were obtained from the base of the skull through the vertex without intravenous contrast.  COMPARISON:  October 01, 2019 FINDINGS: Brain: There is mild cerebral atrophy with widening of the extra-axial spaces and ventricular dilatation. There are areas of decreased attenuation within the white matter tracts of the supratentorial brain, consistent with microvascular disease changes. Vascular: No hyperdense vessel or unexpected calcification. Skull: Normal. Negative for fracture or focal lesion. Sinuses/Orbits: No acute finding. Other: The study is limited secondary to patient motion. IMPRESSION: 1. Generalized cerebral atrophy. 2. No acute intracranial abnormality. Electronically Signed   By: Virgina Norfolk M.D.   On: 05/12/2020 15:22   CT Cervical Spine Wo Contrast  Result Date: 05/12/2020 CLINICAL DATA:  Altered mental status. EXAM: CT CERVICAL SPINE WITHOUT CONTRAST TECHNIQUE: Multidetector CT imaging of the cervical spine was performed without intravenous contrast. Multiplanar CT image reconstructions were also generated. COMPARISON:  April 24, 2014 FINDINGS: Alignment: Normal. Skull base and vertebrae: No acute fracture. No primary bone lesion or focal pathologic process. Soft tissues and spinal canal: No prevertebral fluid or swelling. No visible canal hematoma. Disc levels: Normal multilevel endplates are seen with normal multilevel intervertebral disc spaces. Moderate severity bilateral multilevel facet joint hypertrophy is noted. Upper chest: Negative. Other: The study is limited secondary to patient motion. IMPRESSION: 1. Moderate severity bilateral multilevel facet joint hypertrophy. 2. Limited study secondary to patient motion without evidence of an acute fracture or subluxation of the cervical spine. Electronically Signed   By: Virgina Norfolk M.D.   On: 05/12/2020 15:25    Procedures .Critical Care Performed by: Lennice Sites, DO Authorized by: Lennice Sites, DO   Critical care provider statement:    Critical care time (minutes):  45   Critical care was necessary to treat  or prevent imminent or life-threatening deterioration of the following conditions: acute agitation requiring IM meds.   Critical care was time spent  personally by me on the following activities:  Blood draw for specimens, development of treatment plan with patient or surrogate, evaluation of patient's response to treatment, examination of patient, obtaining history from patient or surrogate, ordering and performing treatments and interventions, ordering and review of laboratory studies, ordering and review of radiographic studies, re-evaluation of patient's condition, review of old charts and pulse oximetry   I assumed direction of critical care for this patient from another provider in my specialty: no     (including critical care time)  Medications Ordered in ED Medications  cephALEXin (KEFLEX) capsule 500 mg (has no administration in time range)  acetaminophen (TYLENOL) tablet 650 mg (has no administration in time range)  aspirin chewable tablet 324 mg (has no administration in time range)  atorvastatin (LIPITOR) tablet 40 mg (has no administration in time range)  famotidine (PEPCID) tablet 40 mg (has no administration in time range)  hydrOXYzine (ATARAX/VISTARIL) tablet 50 mg (has no administration in time range)  ibuprofen (ADVIL) tablet 400 mg (has no administration in time range)  loratadine (CLARITIN) tablet 10 mg (has no administration in time range)  ziprasidone (GEODON) injection 20 mg (20 mg Intramuscular Given 05/12/20 1616)  sterile water (preservative free) injection (  Given 05/12/20 1617)    ED Course  I have reviewed the triage vital signs and the nursing notes.  Pertinent labs & imaging results that were available during my care of the patient were reviewed by me and considered in my medical decision making (see chart for details).    MDM Rules/Calculators/A&P                          Karen Best is here with bizarre behavior/mental status change.  65 years old  with history of mental illness.  Likely noncompliant.  Was found wandering on the street acting bizarre and dangerous.  She is aggressive upon arrival here and appears to be decompensated from a mental health standpoint.  Medical clearance labs are overall unremarkable.  Urinalysis is equivocal for infection but will start Keflex.  We will send urine culture.  Head and neck CT were unremarkable.  Overall patient appears to be suffering from manic episode.  IM Geodon was given due to agitation and aggressive behavior.  We will have her evaluated by psych.  Medically clear at this time.  This chart was dictated using voice recognition software.  Despite best efforts to proofread,  errors can occur which can change the documentation meaning.     Final Clinical Impression(s) / ED Diagnoses Final diagnoses:  Aggressive behavior  Mental health problem    Rx / DC Orders ED Discharge Orders    None       Lennice Sites, DO 05/12/20 1651

## 2020-05-12 NOTE — ED Notes (Signed)
Incontinent of urine while sitting in wheelchair in triage room.

## 2020-05-12 NOTE — ED Triage Notes (Addendum)
Pt to triage via [facility]. Pt was found walking on the side of the road in [place].  Pt told EMS she lives in [place].  No obvious trauma but reported neck pain so EMS applied a C-collar PTA.  Pt oriented to person only and requested [facility].  Once pt arrived to hospital she did know her Social security number and once registered they realized she was from [place].  Pt talking about not eating pork and that you can only eat fish.  States she is here because she "needs to be here."  Reports chronic back pain but states, "I feel fine."  Received call from [place], [place].  States that pt told the police she lived in [place] with a friend.  Contact info for emergency contacts given to [place] [facility] and she states she may follow-up with them to see where pt will go upon discharge.  She was calling to see if there was a plan for discharge at this point and informed her of wait to see EDP.

## 2020-05-12 NOTE — ED Notes (Signed)
Pt became very agitated and anxious  Medicated per mar

## 2020-05-12 NOTE — ED Notes (Signed)
20g L AC

## 2020-05-12 NOTE — ED Notes (Signed)
Please this message is of great importance Please contact [place] social services  when the patient is discharged  [phone]

## 2020-05-13 DIAGNOSIS — F250 Schizoaffective disorder, bipolar type: Secondary | ICD-10-CM

## 2020-05-13 MED ORDER — DIVALPROEX SODIUM 250 MG PO DR TAB
500.0000 mg | DELAYED_RELEASE_TABLET | Freq: Two times a day (BID) | ORAL | Status: DC
  Administered 2020-05-13 – 2020-05-14 (×3): 500 mg via ORAL
  Filled 2020-05-13 (×3): qty 2

## 2020-05-13 MED ORDER — OLANZAPINE 5 MG PO TABS
10.0000 mg | ORAL_TABLET | Freq: Every day | ORAL | Status: DC
  Administered 2020-05-13 – 2020-05-14 (×2): 10 mg via ORAL
  Filled 2020-05-13 (×2): qty 2

## 2020-05-13 NOTE — ED Notes (Signed)
Pt resting. Even Resp. No distress noted at this time. Will continue to monitor.

## 2020-05-13 NOTE — ED Notes (Signed)
Lunch Tray Ordered @ 1037. 

## 2020-05-13 NOTE — ED Notes (Signed)
TTS eval in progress  °

## 2020-05-13 NOTE — ED Notes (Signed)
Ambulated patient to bathroom. Pt urinated and had large bowel movement. Pt placed back in bed and provided with milk and water.
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
## 2020-05-13 NOTE — Consult Note (Signed)
[facility] Psych ED Discharge  05/13/2020 2:00 PM Karen Best  MRN:  [id number] Principal Problem: Schizoaffective disorder, bipolar type [facility]) Discharge Diagnoses: Principal Problem:   Schizoaffective disorder, bipolar type ([place])  Subjective: "I'm trying to get some sleep."  Patient had some nonsensical thoughts on admission.  Positive for UTI, treatment in place.  On assessment, denies suicidal/homicidal ideations, hallucinations, and substance abuse recently.  Past history of cocaine abuse.  Medications started for her schizoaffective d/o bipolar type.  Calm and cooperative in the ED just states she is "trying to get some sleep."  Initially she thought she could not return to her boarding house but can.  Observe overnight and discharge in the am if she remains calm and cooperative, medications started for mood d/o, see treatment plan below.  Total Time spent with patient: 30 minutes  Past Psychiatric History: schizoaffective d/o bipolar type  Past Medical History:  Past Medical History:  Diagnosis Date  . Asthma   . Bipolar 1 disorder ([place])   . Fibromyalgia   . Schizophrenia ([place])   . Tobacco abuse     Past Surgical History:  Procedure Laterality Date  . CESAREAN SECTION    . TUBAL LIGATION     Family History:  Family History  Problem Relation Age of Onset  . Liver cancer Neg Hx   . Liver disease Neg Hx    Family Psychiatric  History: none Social History:  Social History   Substance and Sexual Activity  Alcohol Use Yes   Comment: occ     Social History   Substance and Sexual Activity  Drug Use Yes  . Types: Cocaine   Comment: crack cocaine (last use 10/25/2018)    Social History   Socioeconomic History  . Marital status: Legally Separated    Spouse name: Not on file  . Number of children: Not on file  . Years of education: Not on file  . Highest education level: Not on file  Occupational History  . Not on file  Tobacco Use  . Smoking status: Current Every  Day Smoker    Packs/day: 0.50    Types: Cigarettes  . Smokeless tobacco: Never Used  Vaping Use  . Vaping Use: Never used  Substance and Sexual Activity  . Alcohol use: Yes    Comment: occ  . Drug use: Yes    Types: Cocaine    Comment: crack cocaine (last use 10/25/2018)  . Sexual activity: Yes    Birth control/protection: None  Other Topics Concern  . Not on file  Social History Narrative  . Not on file   Social Determinants of Health   Financial Resource Strain: Not on file  Food Insecurity: Not on file  Transportation Needs: Not on file  Physical Activity: Not on file  Stress: Not on file  Social Connections: Not on file    Has this patient used any form of tobacco in the last 30 days? (Cigarettes, Smokeless Tobacco, Cigars, and/or Pipes) NA  Current Medications: Current Facility-Administered Medications  Medication Dose Route Frequency Provider Last Rate Last Admin  . acetaminophen (TYLENOL) tablet 650 mg  650 mg Oral Q6H PRN Curatolo, Adam, DO      . aspirin chewable tablet 324 mg  324 mg Oral Daily Curatolo, Adam, DO   324 mg at 05/13/20 0906  . atorvastatin (LIPITOR) tablet 40 mg  40 mg Oral Daily Curatolo, Adam, DO   40 mg at 05/13/20 0906  . cephALEXin (KEFLEX) capsule 500 mg  500  mg Oral Q12H Curatolo, Adam, DO   500 mg at 05/13/20 0086  . divalproex (DEPAKOTE) DR tablet 500 mg  500 mg Oral Q12H Maxfield Gildersleeve, Asa Saunas, NP      . famotidine (PEPCID) tablet 40 mg  40 mg Oral BID Curatolo, Adam, DO   40 mg at 05/13/20 7619  . hydrOXYzine (ATARAX/VISTARIL) tablet 50 mg  50 mg Oral QHS Curatolo, Adam, DO      . loratadine (CLARITIN) tablet 10 mg  10 mg Oral Daily Curatolo, Adam, DO   10 mg at 05/13/20 0906  . OLANZapine (ZYPREXA) tablet 10 mg  10 mg Oral QHS Patrecia Pour, NP       Current Outpatient Medications  Medication Sig Dispense Refill  . aspirin 81 MG chewable tablet Chew 4 tablets (324 mg total) by mouth daily. (Patient not taking: Reported on 05/13/2020)    .  atorvastatin (LIPITOR) 40 MG tablet Take 1 tablet (40 mg total) by mouth daily. (Patient not taking: Reported on 05/13/2020) 30 tablet 11  . famotidine (PEPCID) 40 MG tablet Take 40 mg by mouth 2 (two) times daily. (Patient not taking: Reported on 05/13/2020)    . ibuprofen (ADVIL) 200 MG tablet Take 400 mg by mouth every 6 (six) hours as needed for headache or mild pain. (Patient not taking: Reported on 05/13/2020)    . lithium carbonate (LITHOBID) 300 MG CR tablet Take 1 tablet (300 mg total) by mouth 2 (two) times daily. 60 tablet 0  . lithium carbonate (LITHOBID) 300 MG CR tablet Take 1 tablet (300 mg total) by mouth 2 (two) times daily. (Patient not taking: Reported on 05/13/2020) 60 tablet 0  . OLANZapine (ZYPREXA) 20 MG tablet Take 1 tablet (20 mg total) by mouth at bedtime. (Patient not taking: Reported on 05/13/2020) 30 tablet 1  . Valbenazine Tosylate (INGREZZA) 80 MG CAPS Take 40 mg by mouth at bedtime. (Patient not taking: No sig reported) 30 capsule    PTA Medications: (Not in a hospital admission)   Musculoskeletal: Strength & Muscle Tone: within normal limits Gait & Station: did not witness Patient leans: N/A  Psychiatric Specialty Exam: Physical Exam Vitals and nursing note reviewed.  Constitutional:      Appearance: Normal appearance.  HENT:     Head: Normocephalic.  Pulmonary:     Effort: Pulmonary effort is normal.  Musculoskeletal:        General: Normal range of motion.     Cervical back: Normal range of motion.  Neurological:     General: No focal deficit present.     Mental Status: She is alert and oriented to person, place, and time.  Psychiatric:        Attention and Perception: Attention and perception normal.        Mood and Affect: Mood and affect normal.        Speech: Speech normal.        Behavior: Behavior normal. Behavior is cooperative.        Thought Content: Thought content normal.        Cognition and Memory: Cognition and memory normal.         Judgment: Judgment normal.     Review of Systems  All other systems reviewed and are negative.   Blood pressure 128/90, pulse 78, temperature 98 F (36.7 C), temperature source Oral, resp. rate 16, SpO2 100 %.There is no height or weight on file to calculate BMI.  General Appearance: Casual  Eye Contact:  Good  Speech:  Normal Rate  Volume:  Normal  Mood:  Euthymic  Affect:  Congruent  Thought Process:  Coherent and Descriptions of Associations: Intact  Orientation:  Full (Time, Place, and Person)  Thought Content:  WDL and Logical  Suicidal Thoughts:  No  Homicidal Thoughts:  No  Memory:  Immediate;   Fair Recent;   Fair Remote;   Fair  Judgement:  Fair  Insight:  Fair  Psychomotor Activity:  Normal  Concentration:  Concentration: Fair and Attention Span: Fair  Recall:  [employer] of Knowledge:  Fair  Language:  Good  Akathisia:  No  Handed:  Right  AIMS (if indicated):     Assets:  Leisure Time Resilience  ADL's:  Intact  Cognition:  WNL  Sleep:        Demographic Factors:  Living alone  Loss Factors: NA  Historical Factors: NA  Risk Reduction Factors:   Sense of responsibility to family, Living with another person, especially a relative, Positive social support and Positive therapeutic relationship  Continued Clinical Symptoms:  None  Cognitive Features That Contribute To Risk:  None    Suicide Risk:  Minimal: No identifiable suicidal ideation.  Patients presenting with no risk factors but with morbid ruminations; may be classified as minimal risk based on the severity of the depressive symptoms   Plan Of Care/Follow-up recommendations:  Schizoaffective d/o bipolar type: -Started Zyprexa 10 mg at bedtime -Started Depakote 500 mg BID  UTI: EDP ordered Keflex  Insomnia: -Continue hydroxyzine 50 mg at bedtime for sleep Activity:  as tolerated Diet:  heart healthy diet  Disposition: discharge home tomorrow am if remains calm and  cooperative Waylan Boga, NP 05/13/2020, 2:00 PM

## 2020-05-13 NOTE — ED Notes (Signed)
Pt sitting up in bed eating breakfast. Pt cleaned of urinary incontinence and placed in [place] scrubs. Pt able to stand and walk with no assistance.

## 2020-05-13 NOTE — ED Notes (Signed)
Pt resting with eyes closed. Equal chest rise and fall. Bed in low position. VSS.

## 2020-05-14 NOTE — Progress Notes (Signed)
CSW contacted [facility] and was told that pt does not received services with them in over a year. CSW contacted emergency contact [place] [phone] who stated that pt has been living with him at [address] [place], [place]  [address] and that she can return there. He is unable to pick her up due to inclement weather but would be able to tomorrow. [facility] ED RN notified.    Audree Camel, MSW, LCSW, [place] Clinical Social Worker II Disposition CSW [phone]

## 2020-05-14 NOTE — ED Notes (Addendum)
Patient calm and cooperative while this RN obtained vitals. Patient compliant with medication administration, no complaints at this time, resting comfortably in bed. Patient clean and dry at this time.

## 2020-05-14 NOTE — ED Notes (Signed)
Lunch Tray Ordered @ 1109. °

## 2020-05-14 NOTE — ED Notes (Signed)
Pt is sleeping, pt is on stretcher.  Meal placed by bed but pt is still sleeping

## 2020-05-14 NOTE — ED Provider Notes (Signed)
Patient has been assessed and cleared by psychiatry.  She is awaiting her emergency contact to pick her up and take her back to her boardinghouse, that has been delayed until tomorrow because of snowy roads.  Vital signs are stable   Daleen Bo, MD 05/14/20 1820

## 2020-05-14 NOTE — ED Notes (Signed)
Patient resting quietly in room, calm and cooperative with care, dinner tray cleared, patient expresses no complaints at this time.

## 2020-05-14 NOTE — ED Notes (Signed)
Breakfast Ordered 

## 2020-05-15 LAB — URINE CULTURE: Culture: >=100000 — AB

## 2020-05-15 NOTE — Discharge Instructions (Addendum)
You have been seen and discharged from the emergency department.  Follow-up with your primary provider for reevaluation. Take home medications as prescribed. If you have any worsening symptoms or further concerns for health please return to an emergency department for further evaluation.  Area

## 2020-05-15 NOTE — ED Notes (Addendum)
IVC rescinded. Safe transport notified for transportation to home - [address], [place]

## 2020-05-15 NOTE — ED Notes (Signed)
Patient calm and cooperative with morning vitals. Patient denies any pain and reports being comfortable in stretcher watching tv.

## 2020-05-15 NOTE — ED Notes (Signed)
IVC- D/C in AM Breakfast Ordered

## 2020-05-15 NOTE — ED Provider Notes (Addendum)
Patient has been cleared by psychiatry, is awaiting a ride back to boardinghouse.  There has been a delay due to to weather and road conditions.  Patient resting comfortably, offers no complaints.  1225 patient's ride has arrived and she is clear for discharge   Lorelle Gibbs, DO 05/15/20 [place], [place], DO 05/15/20 1225

## 2020-05-15 NOTE — ED Notes (Addendum)
Pt incontinent of large amount of urine, assisted

## 2020-05-21 ENCOUNTER — Ambulatory Visit (HOSPITAL_COMMUNITY)
Admission: EM | Admit: 2020-05-21 | Discharge: 2020-05-23 | Disposition: A | Discharge status: Other Acute Inpatient Hospital | Payer: Medicaid Other | Attending: Psychiatry | Admitting: Psychiatry

## 2020-05-21 ENCOUNTER — Encounter (HOSPITAL_COMMUNITY): Payer: Self-pay | Admitting: Registered Nurse

## 2020-05-21 ENCOUNTER — Other Ambulatory Visit: Payer: Self-pay

## 2020-05-21 DIAGNOSIS — Z20822 Contact with and (suspected) exposure to covid-19: Secondary | ICD-10-CM | POA: Insufficient documentation

## 2020-05-21 DIAGNOSIS — F250 Schizoaffective disorder, bipolar type: Secondary | ICD-10-CM | POA: Insufficient documentation

## 2020-05-21 LAB — COMPREHENSIVE METABOLIC PANEL
ALT: 28 U/L (ref 0–44)
AST: 30 U/L (ref 15–41)
Albumin: 4.3 g/dL (ref 3.5–5.0)
Alkaline Phosphatase: 59 U/L (ref 38–126)
Anion gap: 15 (ref 5–15)
BUN: 15 mg/dL (ref 8–23)
CO2: 21 mmol/L — ABNORMAL LOW (ref 22–32)
Calcium: 10.3 mg/dL (ref 8.9–10.3)
Chloride: 105 mmol/L (ref 98–111)
Creatinine, Ser: 0.67 mg/dL (ref 0.44–1.00)
GFR, Estimated: >60 mL/min (ref >60)
Glucose, Bld: 112 mg/dL — ABNORMAL HIGH (ref 70–99)
Potassium: 4.4 mmol/L (ref 3.5–5.1)
Sodium: 141 mmol/L (ref 135–145)
Total Bilirubin: 0.9 mg/dL (ref 0.3–1.2)
Total Protein: 8.1 g/dL (ref 6.5–8.1)

## 2020-05-21 LAB — CBC WITH DIFFERENTIAL/PLATELET
Abs Immature Granulocytes: 0.03 10*3/uL (ref 0.00–0.07)
Basophils Absolute: 0 10*3/uL (ref 0.0–0.1)
Basophils Relative: 0 %
Eosinophils Absolute: 0.1 10*3/uL (ref 0.0–0.5)
Eosinophils Relative: 1 %
HCT: 42.1 % (ref 36.0–46.0)
Hemoglobin: 12.7 g/dL (ref 12.0–15.0)
Immature Granulocytes: 0 %
Lymphocytes Relative: 33 %
Lymphs Abs: 2.6 10*3/uL (ref 0.7–4.0)
MCH: 22 pg — ABNORMAL LOW (ref 26.0–34.0)
MCHC: 30.2 g/dL (ref 30.0–36.0)
MCV: 73.1 fL — ABNORMAL LOW (ref 80.0–100.0)
Monocytes Absolute: 0.9 10*3/uL (ref 0.1–1.0)
Monocytes Relative: 11 %
Neutro Abs: 4.2 10*3/uL (ref 1.7–7.7)
Neutrophils Relative %: 55 %
Platelets: 289 10*3/uL (ref 150–400)
RBC: 5.76 MIL/uL — ABNORMAL HIGH (ref 3.87–5.11)
RDW: 15.9 % — ABNORMAL HIGH (ref 11.5–15.5)
WBC: 7.8 10*3/uL (ref 4.0–10.5)
nRBC: 0 % (ref 0.0–0.2)

## 2020-05-21 LAB — TSH: TSH: 0.479 u[IU]/mL (ref 0.350–4.500)

## 2020-05-21 LAB — LIPID PANEL
Cholesterol: 157 mg/dL (ref 0–200)
HDL: 49 mg/dL (ref >40)
LDL Cholesterol: 94 mg/dL (ref 0–99)
Total CHOL/HDL Ratio: 3.2 RATIO
Triglycerides: 70 mg/dL (ref <150)
VLDL: 14 mg/dL (ref 0–40)

## 2020-05-21 LAB — MAGNESIUM: Magnesium: 2.3 mg/dL (ref 1.7–2.4)

## 2020-05-21 LAB — ETHANOL: Alcohol, Ethyl (B): <10 mg/dL (ref <10)

## 2020-05-21 LAB — RESP PANEL BY RT-PCR (FLU A&B, COVID) ARPGX2
Influenza A by PCR: NEGATIVE
Influenza B by PCR: NEGATIVE
SARS Coronavirus 2 by RT PCR: NEGATIVE

## 2020-05-21 LAB — POC SARS CORONAVIRUS 2 AG: SARS Coronavirus 2 Ag: NEGATIVE

## 2020-05-21 LAB — VALPROIC ACID LEVEL: Valproic Acid Lvl: <10 ug/mL — ABNORMAL LOW (ref 50.0–100.0)

## 2020-05-21 LAB — POC SARS CORONAVIRUS 2 AG -  ED: SARS Coronavirus 2 Ag: NEGATIVE

## 2020-05-21 MED ORDER — ATORVASTATIN CALCIUM 40 MG PO TABS
40.0000 mg | ORAL_TABLET | Freq: Every day | ORAL | Status: DC
  Administered 2020-05-21 – 2020-05-23 (×3): 40 mg via ORAL
  Filled 2020-05-21 (×3): qty 1

## 2020-05-21 MED ORDER — PANTOPRAZOLE SODIUM 20 MG PO TBEC
20.0000 mg | DELAYED_RELEASE_TABLET | Freq: Two times a day (BID) | ORAL | Status: DC
  Administered 2020-05-22 – 2020-05-23 (×4): 20 mg via ORAL
  Filled 2020-05-21 (×4): qty 1

## 2020-05-21 MED ORDER — DIVALPROEX SODIUM 500 MG PO DR TAB
500.0000 mg | DELAYED_RELEASE_TABLET | Freq: Two times a day (BID) | ORAL | Status: DC
  Administered 2020-05-22 – 2020-05-23 (×3): 500 mg via ORAL
  Filled 2020-05-21 (×3): qty 1

## 2020-05-21 MED ORDER — VALBENAZINE TOSYLATE 40 MG PO CAPS
40.0000 mg | ORAL_CAPSULE | Freq: Every day | ORAL | Status: DC
  Filled 2020-05-21: qty 1

## 2020-05-21 MED ORDER — ACETAMINOPHEN 325 MG PO TABS: 650.0000 mg | ORAL_TABLET | Freq: Four times a day (QID) | ORAL | Status: DC | PRN

## 2020-05-21 MED ORDER — MAGNESIUM HYDROXIDE 400 MG/5ML PO SUSP: 30.0000 mL | Freq: Every day | ORAL | Status: DC | PRN

## 2020-05-21 MED ORDER — ALUM & MAG HYDROXIDE-SIMETH 200-200-20 MG/5ML PO SUSP: 30.0000 mL | ORAL | Status: DC | PRN

## 2020-05-21 MED ORDER — ZIPRASIDONE MESYLATE 20 MG IM SOLR
10.0000 mg | Freq: Once | INTRAMUSCULAR | Status: AC | PRN
  Administered 2020-05-21: 10 mg via INTRAMUSCULAR
  Filled 2020-05-21: qty 20

## 2020-05-21 MED ORDER — ASPIRIN 81 MG PO CHEW
324.0000 mg | CHEWABLE_TABLET | Freq: Every day | ORAL | Status: DC
  Administered 2020-05-21 – 2020-05-23 (×3): 324 mg via ORAL
  Filled 2020-05-21 (×3): qty 4

## 2020-05-21 MED ORDER — OLANZAPINE 10 MG PO TBDP
10.0000 mg | ORAL_TABLET | Freq: Every day | ORAL | Status: DC
  Administered 2020-05-22 (×2): 10 mg via ORAL
  Filled 2020-05-21 (×2): qty 1

## 2020-05-21 MED ORDER — LORAZEPAM 2 MG/ML IJ SOLN
2.0000 mg | Freq: Once | INTRAMUSCULAR | Status: AC | PRN
  Administered 2020-05-21: 2 mg via INTRAMUSCULAR
  Filled 2020-05-21: qty 1

## 2020-05-21 NOTE — ED Provider Notes (Addendum)
Behavioral Health Admission H&P [facility])  Date: 05/21/20 Patient Name: Karen Best MRN: [id number] Chief Complaint: No chief complaint on file.     Diagnoses:  Final diagnoses:  Schizoaffective disorder, bipolar type ([place])    HPI: Karen Best, 65 y.o., female patient with a history of schizoaffective disorder bipolar type presents to [facility] as a walk-in accompanied by her landlord with complaints of manic behavior and offer her medications for 2.5 months.  Patient seen face to face by this provider, consulted with Dr. Serafina Mitchell; and chart reviewed on 05/21/20.  On evaluation AANIKA Best asked what brought her to urgent care and patient stated she went to [place] to get her medications and told her that and have any medicine for her.  Reporting that she does not know where her act team is and cannot find her food stamps.  Patient is not a good historian related to pressured speech and difficult to understand most were spoken at this point.  Patient's landlord reports that patient's behavior worsened about 5 days ago where patient is not sleeping, constant pacing and walking around, and dancing nonstop.  Landlord also reports that patient speech is usually clear that he is right now.  Reports patient was living in a nursing facility after her stroke for about a year about 2 months ago patient requested she wanted to be discharged and have her own resident.  Landlord states he has been knowing patient for while and when she wanted to be discharged and have all resident he will outpatient to come live with him in his private residence until the boardinghouse is ready for patient to move in which should be ready by next week.  Reports when patient was discharged home she was not sent home with any medications and has not had an ACTT team come by the house.  Reports he took her to the hospital about a week ago try to get her back on the medications but she was still discharged home with  no meds and was not scheduled for any outpatient psychiatric services.  Reports he is unable to keep her in a home with her current behavior related to no one being able to sleep and having unlocked the bedroom doors to keep patient from coming in at night.  States he feels patient will be fine when she is on her medications and if he can get her set up with outpatient services. During evaluation [name] is alert/oriented x 3; hyperactive/restless but cooperative.  She does not appear to be responding to internal/external stimuli or delusional thoughts.  Patient denies suicidal/self-harm/homicidal ideation, psychosis, and paranoia.    PHQ 2-9:   [facility] from 05/12/2020 in [place] No Risk       Total Time spent with patient: 45 minutes  Musculoskeletal  Strength & Muscle Tone: within normal limits Gait & Station: normal Patient leans: N/A  Psychiatric Specialty Exam  Presentation General Appearance: Appropriate for Environment; Casual; Disheveled  Eye Contact:Good  Speech:Pressured (difficult to undersand most words)  Speech Volume:Increased  Handedness:Right   Mood and Affect  Mood:Anxious; Labile  Affect:Labile   Thought Process  Thought Processes:Linear  Descriptions of Associations:Tangential  Orientation:Full (Time, Place and Person)  Thought Content:Tangential  Hallucinations:Hallucinations: None  Ideas of Reference:Other (comment)  Suicidal Thoughts:Suicidal Thoughts: No  Homicidal Thoughts:Homicidal Thoughts: No   Sensorium  Memory:Immediate Fair; Recent Fair  Judgment:Fair  Insight:Present  Executive Functions  Concentration:Fair  Attention Span:Fair  Alpha   Psychomotor Activity  Psychomotor Activity:Psychomotor Activity: Restlessness; Extrapyramidal Side Effects (EPS) Extrapyramidal Side Effects (EPS):  Tardive Dyskinesia (Abnormal movement of mouth tongue)   Assets  Assets:Communication Skills; Desire for Improvement; Housing; Social Support   Sleep  Sleep:Sleep: Poor   Physical Exam Constitutional:      General: She is not in acute distress.    Appearance: Normal appearance. She is not ill-appearing or toxic-appearing.  HENT:     Head: Normocephalic.  Eyes:     Pupils: Pupils are equal, round, and reactive to light.  Pulmonary:     Effort: Pulmonary effort is normal.     Breath sounds: Normal breath sounds.  Neurological:     Mental Status: She is alert.  Psychiatric:        Attention and Perception: Attention and perception normal. She is attentive. She does not perceive auditory hallucinations.        Mood and Affect: Mood is anxious. Affect is labile.        Speech: Speech is rapid and pressured.        Behavior: Behavior is hyperactive. Behavior is cooperative.        Thought Content: Thought content normal. Thought content is not paranoid or delusional. Thought content does not include homicidal or suicidal ideation.        Cognition and Memory: Cognition normal.        Judgment: Judgment is impulsive.    Review of Systems  Constitutional: Negative.   HENT: Negative.   Eyes: Negative.   Respiratory: Negative.   Cardiovascular: Negative.   Gastrointestinal: Negative.   Genitourinary: Negative.   Musculoskeletal: Negative.   Skin: Negative.   Neurological: Negative.   Endo/Heme/Allergies: Negative.   Psychiatric/Behavioral: Negative for hallucinations and suicidal ideas. Depression: Stable. The patient has insomnia. Nervous/anxious: Stable.        Patient is living with her land lord until boarding house is completed.  States patient has been with him for 2 months and has not been on any medications; states he has been trying to get her back on medications and set up with outpatient psychiatric services.    Patient also has tardive dyskinesia: uncontrolled  movement of mouth and tongue    Blood pressure (!) 174/104, pulse (!) 104, temperature 99.1 F (37.3 C), temperature source Oral, resp. rate 18, SpO2 99 %. There is no height or weight on file to calculate BMI.  Past Psychiatric History: Schizoaffective disorder bipolar type  Is the patient at risk to self? No  Has the patient been a risk to self in the past 6 months? No .    Has the patient been a risk to self within the distant past? No   Is the patient a risk to others? No   Has the patient been a risk to others in the past 6 months? No   Has the patient been a risk to others within the distant past? No   Past Medical History:  Past Medical History:  Diagnosis Date  . Asthma   . Bipolar 1 disorder ([place])   . Fibromyalgia   . Schizophrenia ([place])   . Tobacco abuse     Past Surgical History:  Procedure Laterality Date  . CESAREAN SECTION    . TUBAL LIGATION      Family History:  Family History  Problem Relation Age of Onset  . Liver cancer Neg Hx   .  Liver disease Neg Hx     Social History:  Social History   Socioeconomic History  . Marital status: Legally Separated    Spouse name: Not on file  . Number of children: Not on file  . Years of education: Not on file  . Highest education level: Not on file  Occupational History  . Not on file  Tobacco Use  . Smoking status: Current Every Day Smoker    Packs/day: 0.50    Types: Cigarettes  . Smokeless tobacco: Never Used  Vaping Use  . Vaping Use: Never used  Substance and Sexual Activity  . Alcohol use: Yes    Comment: occ  . Drug use: Yes    Types: Cocaine    Comment: crack cocaine (last use 10/25/2018)  . Sexual activity: Yes    Birth control/protection: None  Other Topics Concern  . Not on file  Social History Narrative  . Not on file   Social Determinants of Health   Financial Resource Strain: Not on file  Food Insecurity: Not on file  Transportation Needs: Not on file  Physical Activity: Not on  file  Stress: Not on file  Social Connections: Not on file  Intimate Partner Violence: Not on file    SDOH:  SDOH Screenings   Alcohol Screen: Not on file  Depression (IRJ1-8): Not on file  Financial Resource Strain: Not on file  Food Insecurity: Not on file  Housing: Not on file  Physical Activity: Not on file  Social Connections: Not on file  Stress: Not on file  Tobacco Use: High Risk  . Smoking Tobacco Use: Current Every Day Smoker  . Smokeless Tobacco Use: Never Used  Transportation Needs: Not on file    Last Labs:  Admission on 05/12/2020, Discharged on 05/15/2020  Component Date Value Ref Range Status  . Sodium 05/12/2020 139  135 - 145 mmol/L Final  . Potassium 05/12/2020 4.2  3.5 - 5.1 mmol/L Final  . Chloride 05/12/2020 105  98 - 111 mmol/L Final  . CO2 05/12/2020 24  22 - 32 mmol/L Final  . Glucose, Bld 05/12/2020 111* 70 - 99 mg/dL Final   Glucose reference range applies only to samples taken after fasting for at least 8 hours.  . BUN 05/12/2020 6* 8 - 23 mg/dL Final  . Creatinine, Ser 05/12/2020 0.70  0.44 - 1.00 mg/dL Final  . Calcium 05/12/2020 10.1  8.9 - 10.3 mg/dL Final  . Total Protein 05/12/2020 8.5* 6.5 - 8.1 g/dL Final  . Albumin 05/12/2020 4.1  3.5 - 5.0 g/dL Final  . AST 05/12/2020 24  15 - 41 U/L Final  . ALT 05/12/2020 28  0 - 44 U/L Final  . Alkaline Phosphatase 05/12/2020 62  38 - 126 U/L Final  . Total Bilirubin 05/12/2020 0.8  0.3 - 1.2 mg/dL Final  . GFR, Estimated 05/12/2020 >60  >60 mL/min Final   Comment: (NOTE) Calculated using the CKD-EPI Creatinine Equation (2021)   . Anion gap 05/12/2020 10  5 - 15 Final   Performed at [facility] [address], [place], [place] [address]  . WBC 05/12/2020 6.0  4.0 - 10.5 K/uL Final  . RBC 05/12/2020 5.98* 3.87 - 5.11 MIL/uL Final  . Hemoglobin 05/12/2020 13.4  12.0 - 15.0 g/dL Final  . HCT 05/12/2020 44.7  36.0 - 46.0 % Final  . MCV 05/12/2020 74.7* 80.0 - 100.0 fL Final  . MCH  05/12/2020 22.4* 26.0 - 34.0 pg Final  .  MCHC 05/12/2020 30.0  30.0 - 36.0 g/dL Final  . RDW 05/12/2020 15.8* 11.5 - 15.5 % Final  . Platelets 05/12/2020 320  150 - 400 K/uL Final  . nRBC 05/12/2020 0.0  0.0 - 0.2 % Final   Performed at [facility], [place] [address], [place], [place] [address]  . SARS Coronavirus 2 by RT PCR 05/12/2020 NEGATIVE  NEGATIVE Final   Comment: (NOTE) SARS-CoV-2 target nucleic acids are NOT DETECTED.  The SARS-CoV-2 RNA is generally detectable in upper respiratory specimens during the acute phase of infection. The lowest concentration of SARS-CoV-2 viral copies this assay can detect is 138 copies/mL. A negative result does not preclude SARS-Cov-2 infection and should not be used as the sole basis for treatment or other patient management decisions. A negative result may occur with  improper specimen collection/handling, submission of specimen other than nasopharyngeal swab, presence of viral mutation(s) within the areas targeted by this assay, and inadequate number of viral copies(<138 copies/mL). A negative result must be combined with clinical observations, patient history, and epidemiological information. The expected result is Negative.  Fact Sheet for Patients:  EntrepreneurPulse.com.au  Fact Sheet for Healthcare Providers:  IncredibleEmployment.be  This test is no                          t yet approved or cleared by the [place] FDA and  has been authorized for detection and/or diagnosis of SARS-CoV-2 by FDA under an Emergency Use Authorization (EUA). This EUA will remain  in effect (meaning this test can be used) for the duration of the COVID-19 declaration under Section 564(b)(1) of the Act, 21 U.S.C.section 360bbb-3(b)(1), unless the authorization is terminated  or revoked sooner.      . Influenza A by PCR 05/12/2020 NEGATIVE  NEGATIVE Final  . Influenza B by PCR 05/12/2020 NEGATIVE  NEGATIVE  Final   Comment: (NOTE) The Xpert Xpress SARS-CoV-2/FLU/RSV plus assay is intended as an aid in the diagnosis of influenza from Nasopharyngeal swab specimens and should not be used as a sole basis for treatment. Nasal washings and aspirates are unacceptable for Xpert Xpress SARS-CoV-2/FLU/RSV testing.  Fact Sheet for Patients: EntrepreneurPulse.com.au  Fact Sheet for Healthcare Providers: IncredibleEmployment.be  This test is not yet approved or cleared by the [place] FDA and has been authorized for detection and/or diagnosis of SARS-CoV-2 by FDA under an Emergency Use Authorization (EUA). This EUA will remain in effect (meaning this test can be used) for the duration of the COVID-19 declaration under Section 564(b)(1) of the Act, 21 U.S.C. section 360bbb-3(b)(1), unless the authorization is terminated or revoked.  Performed at [facility], [place] [address], [place], [place] [address]   . Alcohol, Ethyl (B) 05/12/2020 <10  <10 mg/dL Final   Comment: (NOTE) Lowest detectable limit for serum alcohol is 10 mg/dL.  For medical purposes only. Performed at [facility], [place] [address], [place], [place] [address]   . Salicylate Lvl 50/12/3816 <7.0* 7.0 - 30.0 mg/dL Final   Performed at [facility] [address] [place], [place] [address]  . Acetaminophen (Tylenol), Serum 05/12/2020 <10* 10 - 30 ug/mL Final   Comment: (NOTE) Therapeutic concentrations vary significantly. A range of 10-30 ug/mL  may be an effective concentration for many patients. However, some  are best treated at concentrations outside of this range. Acetaminophen concentrations >150 ug/mL at 4 hours after ingestion  and >50 ug/mL at 12  hours after ingestion are often associated with  toxic reactions.  Performed at [facility], [place] [address], [place], [place] [address]   . Opiates 05/12/2020 NONE DETECTED  NONE DETECTED Final  . Cocaine  05/12/2020 NONE DETECTED  NONE DETECTED Final  . Benzodiazepines 05/12/2020 NONE DETECTED  NONE DETECTED Final  . Amphetamines 05/12/2020 NONE DETECTED  NONE DETECTED Final  . Tetrahydrocannabinol 05/12/2020 NONE DETECTED  NONE DETECTED Final  . Barbiturates 05/12/2020 NONE DETECTED  NONE DETECTED Final   Comment: (NOTE) DRUG SCREEN FOR MEDICAL PURPOSES ONLY.  IF CONFIRMATION IS NEEDED FOR ANY PURPOSE, NOTIFY LAB WITHIN 5 DAYS.  LOWEST DETECTABLE LIMITS FOR URINE DRUG SCREEN Drug Class                     Cutoff (ng/mL) Amphetamine and metabolites    1000 Barbiturate and metabolites    200 Benzodiazepine                 314 Tricyclics and metabolites     300 Opiates and metabolites        300 Cocaine and metabolites        300 THC                            50 Performed at [facility], [place] [address] [place], [place] [address]   . Color, Urine 05/12/2020 YELLOW  YELLOW Final  . APPearance 05/12/2020 HAZY* CLEAR Final  . Specific Gravity, Urine 05/12/2020 1.005  1.005 - 1.030 Final  . pH 05/12/2020 7.0  5.0 - 8.0 Final  . Glucose, UA 05/12/2020 NEGATIVE  NEGATIVE mg/dL Final  . Hgb urine dipstick 05/12/2020 NEGATIVE  NEGATIVE Final  . Bilirubin Urine 05/12/2020 NEGATIVE  NEGATIVE Final  . Ketones, ur 05/12/2020 NEGATIVE  NEGATIVE mg/dL Final  . Protein, ur 05/12/2020 NEGATIVE  NEGATIVE mg/dL Final  . Nitrite 05/12/2020 POSITIVE* NEGATIVE Final  . Leukocytes,Ua 05/12/2020 SMALL* NEGATIVE Final  . RBC / HPF 05/12/2020 0-5  0 - 5 RBC/hpf Final  . WBC, UA 05/12/2020 6-10  0 - 5 WBC/hpf Final  . Bacteria, UA 05/12/2020 RARE* NONE SEEN Final  . Squamous Epithelial / LPF 05/12/2020 0-5  0 - 5 Final   Performed at [facility], [place] [address], [place], [place] [address]  . Specimen Description 05/12/2020 URINE, RANDOM   Final  . Special Requests 05/12/2020    Final                   Value:NONE Performed at [facility], [place] [address], [place], [place]  [address]   . Culture 05/12/2020 >=100,000 COLONIES/mL ESCHERICHIA COLI*  Final  . Report Status 05/12/2020 05/15/2020 FINAL   Final  . Organism ID, Bacteria 05/12/2020 ESCHERICHIA COLI*  Final    Allergies: Patient has no known allergies.  PTA Medications: (Not in a hospital admission)   Medical Decision Making  Patient admitted to continuous assessment unit while awaiting placement for inpatient psychiatric treatment.  Lab Orders     Resp Panel by RT-PCR (Flu A&B, Covid) Nasopharyngeal Swab     CBC with Differential/Platelet     Comprehensive metabolic panel     Urinalysis, Routine w reflex microscopic Urine, Clean Catch     Valproic acid level     Ethanol     Magnesium     TSH     Lipid panel  POC SARS Coronavirus 2 Ag-ED - Nasal Swab (BD Veritor Kit)     POCT Urine Drug Screen - ([facility])    Medication management: Zyprexa Zydis 10 mg nightly for mood stabilization  Depakote 500 mg twice daily for mood stabilization Ingrezza 40 mg nightly for tardive dyskinesia As needed for agitation Geodon 10 mg/Ativan 2 mg IM once as needed Vistaril 25 mg 3 times daily as needed anxiety Restarted home medications: Lipitor 40 mg daily chewable aspirin 324 mg Protonix 20 mg daily for GERD  Recommendations  Based on my evaluation the patient does not appear to have an emergency medical condition.  Giavonna Pflum, NP 05/21/20  5:04 PM

## 2020-05-21 NOTE — ED Notes (Signed)
Pt sleeping'@this'$  time. breathing even and unlabored. Will continue to monitor for safety

## 2020-05-21 NOTE — Discharge Instructions (Addendum)
Transfer to [facility]

## 2020-05-21 NOTE — ED Notes (Signed)
Patient emergency contact is [facility]

## 2020-05-21 NOTE — ED Notes (Signed)
Pt brought on unit. Ambulates per self. No SI, HI, or AVH. A&O only to self. Calm and cooperative. Tried to orient to unit and staff. Will continue to monitor for safety

## 2020-05-21 NOTE — BH Assessment (Signed)
Comprehensive Clinical Assessment (CCA) Screening, Triage and Referral Note  05/21/2020 Karen Best [id number]  Karen Best is a 65 year old female presenting to [facility] voluntarily with her Karen Best due to concerns of altered mental status. Patient was seen on 05/12/20 for similar reasons. Today patient speech is pressured, slurred and loud, somewhat difficult to understand; her thoughts are nonsensical, and she appears to be manic. Patient has psychiatric history of schizophrenia and cocaine use. Karen Best reports that patient behaviors have been increasingly erratic over the last five days and reports that patient is not sleeping, up at night, pressured speech and dancing. Patient reports that she was in a facility on [facility] and when she discharged, the facility did not give her any medications and her ACTT team has not seen her. Patient has been without medications for few months. Patient is currently living at Karen Best private residence due to the boarding house being renovated. Patient does not have a payee nor a guardian and Karen Best reports that patient has been able to manage her finances independently. Patient denies SI/HI/AVH. TTS could not complete full CCA due to patient mental status.    TTS contact ACTT crisis line and received report that patient is no longer receiving ACTT service with [facility] however team lead was notified about patient.   Disposition: Per Earleen Newport, NP, patient recommended for inpatient treatment.             Chief Complaint: No chief complaint on file.  Visit Diagnosis: Schizoaffective disorder, bipolar type [facility])  Patient Reported Information How did you hear about [place]? Family/Friend   Referral name: Sheriff's Department found patient wandering down the road with an altered mental status   Referral phone number: No data recorded Whom do you see for routine medical problems? Primary Care   Practice/Facility Name: [facility]   Practice/Facility Phone Number: No data recorded  Name of Contact: No data recorded  Contact Number: No data recorded  Contact Fax Number: No data recorded  Prescriber Name: No data recorded  Prescriber Address (if known): No data recorded What Is the Reason for Your Visit/Call Today? Patient was brought to the ED by the [facility] Department after she was found walking down a road with what appeared to be an altered mental status  How Long Has This Been Causing You Problems? 1 wk - 1 month  Have You Recently Been in Any Inpatient Treatment (Hospital/Detox/Crisis Center/28-Day Program)? No (Patient was at a physical rehab center for 6 mos recently)   Name/Location of Program/Hospital:No data recorded  How Long Were You There? No data recorded  When Were You Discharged? No data recorded Have You Ever Received Services From [facility] Before? Yes   Who Do You See at [facility]? patient was inpatient medical  Have You Recently Had Any Thoughts About Hurting Yourself? No   Are You Planning to Commit Suicide/Harm Yourself At This time?  No  Have you Recently Had Thoughts About [place]? No   Explanation: No data recorded Have You Used Any Alcohol or Drugs in the Past 24 Hours? No   How Long Ago Did You Use Drugs or Alcohol?  No data recorded  What Did You Use and How Much? No data recorded What Do You Feel Would Help You the Most Today? Other (Comment) (patient is unaware of what would help her)  Do You Currently Have a Therapist/Psychiatrist? Yes   Name of Therapist/Psychiatrist: Patient receives [employer] from an unknown  provider   Have You Been Recently Discharged From Any Office Practice or Programs? No   Explanation of Discharge From Practice/Program:  No data recorded    CCA Screening Triage Referral Assessment Type of Contact: Face-to-Face   Is this Initial or Reassessment? Initial Assessment   Date Telepsych consult  ordered in [facility]:  05/12/2020   Time Telepsych consult ordered in [facility]:  1549  Patient Reported Information Reviewed? Yes   Patient Left Without Being Seen? No data recorded  Reason for Not Completing Assessment: No data recorded Collateral Involvement: Patient's friend, Karen Best provided collateral information  Does Patient Have a [place]? No data recorded  Name and Contact of Legal Guardian:  No data recorded If Minor and Not Living with Parent(s), Who has Custody? No data recorded Is CPS involved or ever been involved? Never  Is APS involved or ever been involved? -- (possible CPS involvment)  Patient Determined To Be At Risk for Harm To Self or Others Based on Review of Patient Reported Information or Presenting Complaint? No   Method: No data recorded  Availability of Means: No data recorded  Intent: No data recorded  Notification Required: No data recorded  Additional Information for Danger to Others Potential:  No data recorded  Additional Comments for Danger to Others Potential:  No data recorded  Are There Guns or Other Weapons in Your Home?  No data recorded   Types of Guns/Weapons: No data recorded   Are These Weapons Safely Secured?                              No data recorded   Who Could Verify You Are Able To Have These Secured:    No data recorded Do You Have any Outstanding Charges, Pending Court Dates, Parole/Probation? No data recorded Contacted To Inform of Risk of Harm To Self or Others: No data recorded Location of Assessment: [facility]  Does Patient Present under Involuntary Commitment? No   IVC Papers Initial File Date: No data recorded  [place] of Residence: [place]  Patient Currently Receiving the Following Services: Medication Management   Determination of Need: Emergent (2 hours)   Options For Referral: Inpatient Hospitalization   Falencio Julien Nordmann, [facility]

## 2020-05-22 MED ORDER — TRAZODONE HCL 50 MG PO TABS
50.0000 mg | ORAL_TABLET | Freq: Every evening | ORAL | Status: DC | PRN
  Administered 2020-05-22: 50 mg via ORAL
  Filled 2020-05-22: qty 1

## 2020-05-22 NOTE — ED Notes (Addendum)
Pt observed dancing unit, speech unintelligible but able to use head gestures to questions asked. Speech muffled. Denies SI/HI/AVH. Took all am medication without difficulty. Pt reports sleeping good last night via head gestures. Remain hypomanic, questions asked by staff limited due to state of mind. Will continue to monitor for safety.

## 2020-05-22 NOTE — ED Notes (Signed)
Pt has been sleeping after she was administered  injections. NP aware of 2200 mediations not given because she has been sleep. Will continue to monitor pt for safety

## 2020-05-22 NOTE — ED Notes (Signed)
Pt awake, alert & responsive, no distress noted.  Pacing at intervals.   Monitoring for safety.

## 2020-05-22 NOTE — ED Notes (Signed)
Pt escorted to bathroom 

## 2020-05-22 NOTE — Progress Notes (Signed)
CSW received a call from [facility], [facility].  She informed CSW that the facility is currently at capacity and could not accept the patient at this time.  [place] Disposition [phone] (cell)

## 2020-05-22 NOTE — ED Provider Notes (Signed)
Behavioral Health Progress Note  Date and Time: 05/22/2020 1:02 PM Name: Karen Best MRN:  [id number]  Subjective:  Chart reviewed. Received PRN geodon 10 mg and ativan 2 mg IM at 1951 yesterday. Patient continues to dance on the unit. Speech is  mostly unintelligible, garbled and  difficult to understand ; TP noted to be disorganized at times and tangential. She denies SI/HI/AVH. She does recall coming in with another person to the [facility] yesterday in order to get restarted on her medications. She states that she has only slept 2 hours. No other complaints reported or elicited. Patient frequently goes on tangent about a friend's cooking when asked questions.  Diagnosis:  Final diagnoses:  Schizoaffective disorder, bipolar type ([place])    Total Time spent with patient: 15 minutes  Past Psychiatric History: see H&P Past Medical History:  Past Medical History:  Diagnosis Date  . Asthma   . Bipolar 1 disorder ([place])   . Fibromyalgia   . Schizophrenia ([place])   . Tobacco abuse     Past Surgical History:  Procedure Laterality Date  . CESAREAN SECTION    . TUBAL LIGATION     Family History:  Family History  Problem Relation Age of Onset  . Liver cancer Neg Hx   . Liver disease Neg Hx    Family Psychiatric  History: see h&P Social History:  Social History   Substance and Sexual Activity  Alcohol Use Yes   Comment: occ     Social History   Substance and Sexual Activity  Drug Use Yes  . Types: Cocaine   Comment: crack cocaine (last use 10/25/2018)    Social History   Socioeconomic History  . Marital status: Legally Separated    Spouse name: Not on file  . Number of children: Not on file  . Years of education: Not on file  . Highest education level: Not on file  Occupational History  . Not on file  Tobacco Use  . Smoking status: Current Every Day Smoker    Packs/day: 0.50    Types: Cigarettes  . Smokeless tobacco: Never Used  Vaping Use  . Vaping Use: Never used   Substance and Sexual Activity  . Alcohol use: Yes    Comment: occ  . Drug use: Yes    Types: Cocaine    Comment: crack cocaine (last use 10/25/2018)  . Sexual activity: Yes    Birth control/protection: None  Other Topics Concern  . Not on file  Social History Narrative  . Not on file   Social Determinants of Health   Financial Resource Strain: Not on file  Food Insecurity: Not on file  Transportation Needs: Not on file  Physical Activity: Not on file  Stress: Not on file  Social Connections: Not on file   SDOH:  SDOH Screenings   Alcohol Screen: Not on file  Depression [id number]): Not on file  Financial Resource Strain: Not on file  Food Insecurity: Not on file  Housing: Not on file  Physical Activity: Not on file  Social Connections: Not on file  Stress: Not on file  Tobacco Use: High Risk  . Smoking Tobacco Use: Current Every Day Smoker  . Smokeless Tobacco Use: Never Used  Transportation Needs: Not on file   Additional Social History:                         Sleep: Poor  Appetite:  Fair  Current Medications:  Current Facility-Administered  Medications  Medication Dose Route Frequency Provider Last Rate Last Admin  . acetaminophen (TYLENOL) tablet 650 mg  650 mg Oral Q6H PRN Rankin, Shuvon B, NP      . alum & mag hydroxide-simeth (MAALOX/MYLANTA) 200-200-20 MG/5ML suspension 30 mL  30 mL Oral Q4H PRN Rankin, Shuvon B, NP      . aspirin chewable tablet 324 mg  324 mg Oral Daily Rankin, Shuvon B, NP   324 mg at 05/22/20 0903  . atorvastatin (LIPITOR) tablet 40 mg  40 mg Oral Daily Rankin, Shuvon B, NP   40 mg at 05/22/20 0903  . divalproex (DEPAKOTE) DR tablet 500 mg  500 mg Oral Q12H Rankin, Shuvon B, NP   500 mg at 05/22/20 0903  . magnesium hydroxide (MILK OF MAGNESIA) suspension 30 mL  30 mL Oral Daily PRN Rankin, Shuvon B, NP      . OLANZapine zydis (ZYPREXA) disintegrating tablet 10 mg  10 mg Oral QHS Rankin, Shuvon B, NP   10 mg at 05/22/20 0605   . pantoprazole (PROTONIX) EC tablet 20 mg  20 mg Oral BID Rankin, Shuvon B, NP   20 mg at 05/22/20 0903  . Valbenazine Tosylate CAPS 40 mg  40 mg Oral QHS Rankin, Shuvon B, NP       Current Outpatient Medications  Medication Sig Dispense Refill  . aspirin 81 MG chewable tablet Chew 4 tablets (324 mg total) by mouth daily. (Patient not taking: Reported on 05/13/2020)    . atorvastatin (LIPITOR) 40 MG tablet Take 1 tablet (40 mg total) by mouth daily. (Patient not taking: Reported on 05/13/2020) 30 tablet 11  . famotidine (PEPCID) 40 MG tablet Take 40 mg by mouth 2 (two) times daily. (Patient not taking: Reported on 05/13/2020)    . ibuprofen (ADVIL) 200 MG tablet Take 400 mg by mouth every 6 (six) hours as needed for headache or mild pain. (Patient not taking: Reported on 05/13/2020)    . lithium carbonate (LITHOBID) 300 MG CR tablet Take 1 tablet (300 mg total) by mouth 2 (two) times daily. 60 tablet 0  . lithium carbonate (LITHOBID) 300 MG CR tablet Take 1 tablet (300 mg total) by mouth 2 (two) times daily. (Patient not taking: Reported on 05/13/2020) 60 tablet 0  . OLANZapine (ZYPREXA) 20 MG tablet Take 1 tablet (20 mg total) by mouth at bedtime. (Patient not taking: Reported on 05/13/2020) 30 tablet 1  . Valbenazine Tosylate (INGREZZA) 80 MG CAPS Take 40 mg by mouth at bedtime. (Patient not taking: No sig reported) 30 capsule     Labs  Lab Results:  Admission on 05/21/2020  Component Date Value Ref Range Status  . SARS Coronavirus 2 by RT PCR 05/21/2020 NEGATIVE  NEGATIVE Final   Comment: (NOTE) SARS-CoV-2 target nucleic acids are NOT DETECTED.  The SARS-CoV-2 RNA is generally detectable in upper respiratory specimens during the acute phase of infection. The lowest concentration of SARS-CoV-2 viral copies this assay can detect is 138 copies/mL. A negative result does not preclude SARS-Cov-2 infection and should not be used as the sole basis for treatment or other patient management  decisions. A negative result may occur with  improper specimen collection/handling, submission of specimen other than nasopharyngeal swab, presence of viral mutation(s) within the areas targeted by this assay, and inadequate number of viral copies(<138 copies/mL). A negative result must be combined with clinical observations, patient history, and epidemiological information. The expected result is Negative.  Fact Sheet for Patients:  EntrepreneurPulse.com.au  Fact Sheet for Healthcare Providers:  IncredibleEmployment.be  This test is no                          t yet approved or cleared by the [place] FDA and  has been authorized for detection and/or diagnosis of SARS-CoV-2 by FDA under an Emergency Use Authorization (EUA). This EUA will remain  in effect (meaning this test can be used) for the duration of the COVID-19 declaration under Section 564(b)(1) of the Act, 21 U.S.C.section 360bbb-3(b)(1), unless the authorization is terminated  or revoked sooner.      . Influenza A by PCR 05/21/2020 NEGATIVE  NEGATIVE Final  . Influenza B by PCR 05/21/2020 NEGATIVE  NEGATIVE Final   Comment: (NOTE) The Xpert Xpress SARS-CoV-2/FLU/RSV plus assay is intended as an aid in the diagnosis of influenza from Nasopharyngeal swab specimens and should not be used as a sole basis for treatment. Nasal washings and aspirates are unacceptable for Xpert Xpress SARS-CoV-2/FLU/RSV testing.  Fact Sheet for Patients: EntrepreneurPulse.com.au  Fact Sheet for Healthcare Providers: IncredibleEmployment.be  This test is not yet approved or cleared by the [place] FDA and has been authorized for detection and/or diagnosis of SARS-CoV-2 by FDA under an Emergency Use Authorization (EUA). This EUA will remain in effect (meaning this test can be used) for the duration of the COVID-19 declaration under Section 564(b)(1) of the  Act, 21 U.S.C. section 360bbb-3(b)(1), unless the authorization is terminated or revoked.  Performed at [facility], [place] [address], [place], [place] [address]   . SARS Coronavirus 2 Ag 05/21/2020 Negative  Negative Final  . WBC 05/21/2020 7.8  4.0 - 10.5 K/uL Final  . RBC 05/21/2020 5.76* 3.87 - 5.11 MIL/uL Final  . Hemoglobin 05/21/2020 12.7  12.0 - 15.0 g/dL Final  . HCT 05/21/2020 42.1  36.0 - 46.0 % Final  . MCV 05/21/2020 73.1* 80.0 - 100.0 fL Final  . MCH 05/21/2020 22.0* 26.0 - 34.0 pg Final  . MCHC 05/21/2020 30.2  30.0 - 36.0 g/dL Final  . RDW 05/21/2020 15.9* 11.5 - 15.5 % Final  . Platelets 05/21/2020 289  150 - 400 K/uL Final  . nRBC 05/21/2020 0.0  0.0 - 0.2 % Final  . Neutrophils Relative % 05/21/2020 55  % Final  . Neutro Abs 05/21/2020 4.2  1.7 - 7.7 K/uL Final  . Lymphocytes Relative 05/21/2020 33  % Final  . Lymphs Abs 05/21/2020 2.6  0.7 - 4.0 K/uL Final  . Monocytes Relative 05/21/2020 11  % Final  . Monocytes Absolute 05/21/2020 0.9  0.1 - 1.0 K/uL Final  . Eosinophils Relative 05/21/2020 1  % Final  . Eosinophils Absolute 05/21/2020 0.1  0.0 - 0.5 K/uL Final  . Basophils Relative 05/21/2020 0  % Final  . Basophils Absolute 05/21/2020 0.0  0.0 - 0.1 K/uL Final  . Immature Granulocytes 05/21/2020 0  % Final  . Abs Immature Granulocytes 05/21/2020 0.03  0.00 - 0.07 K/uL Final   Performed at [facility], [place] [address], [place], [place] [address]  . Sodium 05/21/2020 141  135 - 145 mmol/L Final  . Potassium 05/21/2020 4.4  3.5 - 5.1 mmol/L Final  . Chloride 05/21/2020 105  98 - 111 mmol/L Final  . CO2 05/21/2020 21* 22 - 32 mmol/L Final  . Glucose, Bld 05/21/2020 112* 70 - 99 mg/dL Final   Glucose reference range applies only to samples taken after fasting for  at least 8 hours.  . BUN 05/21/2020 15  8 - 23 mg/dL Final  . Creatinine, Ser 05/21/2020 0.67  0.44 - 1.00 mg/dL Final  . Calcium 05/21/2020 10.3  8.9 - 10.3 mg/dL Final  . Total Protein  05/21/2020 8.1  6.5 - 8.1 g/dL Final  . Albumin 05/21/2020 4.3  3.5 - 5.0 g/dL Final  . AST 05/21/2020 30  15 - 41 U/L Final  . ALT 05/21/2020 28  0 - 44 U/L Final  . Alkaline Phosphatase 05/21/2020 59  38 - 126 U/L Final  . Total Bilirubin 05/21/2020 0.9  0.3 - 1.2 mg/dL Final  . GFR, Estimated 05/21/2020 >60  >60 mL/min Final   Comment: (NOTE) Calculated using the CKD-EPI Creatinine Equation (2021)   . Anion gap 05/21/2020 15  5 - 15 Final   Performed at [facility] [address], [place], [place] [address]  . Valproic Acid Lvl 05/21/2020 <10* 50.0 - 100.0 ug/mL Final   Comment: RESULTS CONFIRMED BY MANUAL DILUTION Performed at [facility], [place] [address], [place], [place] [address]   . Alcohol, Ethyl (B) 05/21/2020 <10  <10 mg/dL Final   Comment: (NOTE) Lowest detectable limit for serum alcohol is 10 mg/dL.  For medical purposes only. Performed at [facility], [place] [address], [place], [place] [address]   . Magnesium 05/21/2020 2.3  1.7 - 2.4 mg/dL Final   Performed at [facility], [place] [address], [place], [place] [address]  . TSH 05/21/2020 0.479  0.350 - 4.500 uIU/mL Final   Comment: Performed by a 3rd Generation assay with a functional sensitivity of <=0.01 uIU/mL. Performed at [facility], [place] [address] [place], [place] [address]   . Cholesterol 05/21/2020 157  0 - 200 mg/dL Final  . Triglycerides 05/21/2020 70  <150 mg/dL Final  . HDL 05/21/2020 49  >40 mg/dL Final  . Total CHOL/HDL Ratio 05/21/2020 3.2  RATIO Final  . VLDL 05/21/2020 14  0 - 40 mg/dL Final  . LDL Cholesterol 05/21/2020 94  0 - 99 mg/dL Final   Comment:        Total Cholesterol/HDL:CHD Risk Coronary Heart Disease Risk Table                     Men   Women  1/2 Average Risk   3.4   3.3  Average Risk       5.0   4.4  2 X Average Risk   9.6   7.1  3 X Average Risk  23.4   11.0        Use the calculated Patient Ratio above and the CHD Risk Table to determine the  patient's CHD Risk.        ATP III CLASSIFICATION (LDL):  <100     mg/dL   Optimal  100-129  mg/dL   Near or Above                    Optimal  130-159  mg/dL   Borderline  160-189  mg/dL   High  >190     mg/dL   Very High Performed at [facility] [address] [place], [place] [address]   . SARS Coronavirus 2 Ag 05/21/2020 NEGATIVE  NEGATIVE Final   Comment: (NOTE) SARS-CoV-2 antigen NOT DETECTED.   Negative results are presumptive.  Negative results do not preclude SARS-CoV-2 infection and should not be used  as the sole basis for treatment or other patient management decisions, including infection  control decisions, particularly in the presence of clinical signs and  symptoms consistent with COVID-19, or in those who have been in contact with the virus.  Negative results must be combined with clinical observations, patient history, and epidemiological information. The expected result is Negative.  Fact Sheet for Patients: HandmadeRecipes.com.cy  Fact Sheet for Healthcare Providers: FuneralLife.at  This test is not yet approved or cleared by the [place] FDA and  has been authorized for detection and/or diagnosis of SARS-CoV-2 by FDA under an Emergency Use Authorization (EUA).  This EUA will remain in effect (meaning this test can be used) for the duration of  the COV                          ID-19 declaration under Section 564(b)(1) of the Act, 21 U.S.C. section 360bbb-3(b)(1), unless the authorization is terminated or revoked sooner.    Admission on 05/12/2020, Discharged on 05/15/2020  Component Date Value Ref Range Status  . Sodium 05/12/2020 139  135 - 145 mmol/L Final  . Potassium 05/12/2020 4.2  3.5 - 5.1 mmol/L Final  . Chloride 05/12/2020 105  98 - 111 mmol/L Final  . CO2 05/12/2020 24  22 - 32 mmol/L Final  . Glucose, Bld 05/12/2020 111* 70 - 99 mg/dL Final   Glucose reference range applies only to  samples taken after fasting for at least 8 hours.  . BUN 05/12/2020 6* 8 - 23 mg/dL Final  . Creatinine, Ser 05/12/2020 0.70  0.44 - 1.00 mg/dL Final  . Calcium 05/12/2020 10.1  8.9 - 10.3 mg/dL Final  . Total Protein 05/12/2020 8.5* 6.5 - 8.1 g/dL Final  . Albumin 05/12/2020 4.1  3.5 - 5.0 g/dL Final  . AST 05/12/2020 24  15 - 41 U/L Final  . ALT 05/12/2020 28  0 - 44 U/L Final  . Alkaline Phosphatase 05/12/2020 62  38 - 126 U/L Final  . Total Bilirubin 05/12/2020 0.8  0.3 - 1.2 mg/dL Final  . GFR, Estimated 05/12/2020 >60  >60 mL/min Final   Comment: (NOTE) Calculated using the CKD-EPI Creatinine Equation (2021)   . Anion gap 05/12/2020 10  5 - 15 Final   Performed at [facility] [address], [place], [place] [address]  . WBC 05/12/2020 6.0  4.0 - 10.5 K/uL Final  . RBC 05/12/2020 5.98* 3.87 - 5.11 MIL/uL Final  . Hemoglobin 05/12/2020 13.4  12.0 - 15.0 g/dL Final  . HCT 05/12/2020 44.7  36.0 - 46.0 % Final  . MCV 05/12/2020 74.7* 80.0 - 100.0 fL Final  . MCH 05/12/2020 22.4* 26.0 - 34.0 pg Final  . MCHC 05/12/2020 30.0  30.0 - 36.0 g/dL Final  . RDW 05/12/2020 15.8* 11.5 - 15.5 % Final  . Platelets 05/12/2020 320  150 - 400 K/uL Final  . nRBC 05/12/2020 0.0  0.0 - 0.2 % Final   Performed at [facility] [address], [place], [place] [address]  . SARS Coronavirus 2 by RT PCR 05/12/2020 NEGATIVE  NEGATIVE Final   Comment: (NOTE) SARS-CoV-2 target nucleic acids are NOT DETECTED.  The SARS-CoV-2 RNA is generally detectable in upper respiratory specimens during the acute phase of infection. The lowest concentration of SARS-CoV-2 viral copies this assay can detect is 138 copies/mL. A negative result does not preclude SARS-Cov-2 infection and should not be used as the sole  basis for treatment or other patient management decisions. A negative result may occur with  improper specimen collection/handling, submission of specimen other than nasopharyngeal swab,  presence of viral mutation(s) within the areas targeted by this assay, and inadequate number of viral copies(<138 copies/mL). A negative result must be combined with clinical observations, patient history, and epidemiological information. The expected result is Negative.  Fact Sheet for Patients:  EntrepreneurPulse.com.au  Fact Sheet for Healthcare Providers:  IncredibleEmployment.be  This test is no                          t yet approved or cleared by the [place] FDA and  has been authorized for detection and/or diagnosis of SARS-CoV-2 by FDA under an Emergency Use Authorization (EUA). This EUA will remain  in effect (meaning this test can be used) for the duration of the COVID-19 declaration under Section 564(b)(1) of the Act, 21 U.S.C.section 360bbb-3(b)(1), unless the authorization is terminated  or revoked sooner.      . Influenza A by PCR 05/12/2020 NEGATIVE  NEGATIVE Final  . Influenza B by PCR 05/12/2020 NEGATIVE  NEGATIVE Final   Comment: (NOTE) The Xpert Xpress SARS-CoV-2/FLU/RSV plus assay is intended as an aid in the diagnosis of influenza from Nasopharyngeal swab specimens and should not be used as a sole basis for treatment. Nasal washings and aspirates are unacceptable for Xpert Xpress SARS-CoV-2/FLU/RSV testing.  Fact Sheet for Patients: EntrepreneurPulse.com.au  Fact Sheet for Healthcare Providers: IncredibleEmployment.be  This test is not yet approved or cleared by the [place] FDA and has been authorized for detection and/or diagnosis of SARS-CoV-2 by FDA under an Emergency Use Authorization (EUA). This EUA will remain in effect (meaning this test can be used) for the duration of the COVID-19 declaration under Section 564(b)(1) of the Act, 21 U.S.C. section 360bbb-3(b)(1), unless the authorization is terminated or revoked.  Performed at [facility], [place]  [address] [place], [place] [address]   . Alcohol, Ethyl (B) 05/12/2020 <10  <10 mg/dL Final   Comment: (NOTE) Lowest detectable limit for serum alcohol is 10 mg/dL.  For medical purposes only. Performed at [facility], [place] [address] [place], [place] [address]   . Salicylate Lvl [id number] <7.0* 7.0 - 30.0 mg/dL Final   Performed at [facility] [address], [place], [place] [address]  . Acetaminophen (Tylenol), Serum 05/12/2020 <10* 10 - 30 ug/mL Final   Comment: (NOTE) Therapeutic concentrations vary significantly. A range of 10-30 ug/mL  may be an effective concentration for many patients. However, some  are best treated at concentrations outside of this range. Acetaminophen concentrations >150 ug/mL at 4 hours after ingestion  and >50 ug/mL at 12 hours after ingestion are often associated with  toxic reactions.  Performed at [facility], [place] [address], [place], [place] [address]   . Opiates 05/12/2020 NONE DETECTED  NONE DETECTED Final  . Cocaine 05/12/2020 NONE DETECTED  NONE DETECTED Final  . Benzodiazepines 05/12/2020 NONE DETECTED  NONE DETECTED Final  . Amphetamines 05/12/2020 NONE DETECTED  NONE DETECTED Final  . Tetrahydrocannabinol 05/12/2020 NONE DETECTED  NONE DETECTED Final  . Barbiturates 05/12/2020 NONE DETECTED  NONE DETECTED Final   Comment: (NOTE) DRUG SCREEN FOR MEDICAL PURPOSES ONLY.  IF CONFIRMATION IS NEEDED FOR ANY PURPOSE, NOTIFY LAB WITHIN 5 DAYS.  LOWEST DETECTABLE LIMITS FOR URINE DRUG SCREEN Drug Class  Cutoff (ng/mL) Amphetamine and metabolites    1000 Barbiturate and metabolites    200 Benzodiazepine                 [id number] Tricyclics and metabolites     300 Opiates and metabolites        300 Cocaine and metabolites        300 THC                            50 Performed at [facility], [place] [address], [place], [place] [address]   . Color, Urine 05/12/2020 YELLOW  YELLOW Final  . APPearance  05/12/2020 HAZY* CLEAR Final  . Specific Gravity, Urine 05/12/2020 1.005  1.005 - 1.030 Final  . pH 05/12/2020 7.0  5.0 - 8.0 Final  . Glucose, UA 05/12/2020 NEGATIVE  NEGATIVE mg/dL Final  . Hgb urine dipstick 05/12/2020 NEGATIVE  NEGATIVE Final  . Bilirubin Urine 05/12/2020 NEGATIVE  NEGATIVE Final  . Ketones, ur 05/12/2020 NEGATIVE  NEGATIVE mg/dL Final  . Protein, ur 05/12/2020 NEGATIVE  NEGATIVE mg/dL Final  . Nitrite 05/12/2020 POSITIVE* NEGATIVE Final  . Leukocytes,Ua 05/12/2020 SMALL* NEGATIVE Final  . RBC / HPF 05/12/2020 0-5  0 - 5 RBC/hpf Final  . WBC, UA 05/12/2020 6-10  0 - 5 WBC/hpf Final  . Bacteria, UA 05/12/2020 RARE* NONE SEEN Final  . Squamous Epithelial / LPF 05/12/2020 0-5  0 - 5 Final   Performed at [facility], [place] [address], [place], [place] [address]  . Specimen Description 05/12/2020 URINE, RANDOM   Final  . Special Requests 05/12/2020    Final                   Value:NONE Performed at [facility], [place] [address], [place],  [address]   . Culture 05/12/2020 >=100,000 COLONIES/mL ESCHERICHIA COLI*  Final  . Report Status 05/12/2020 05/15/2020 FINAL   Final  . Organism ID, Bacteria 05/12/2020 ESCHERICHIA COLI*  Final    Blood Alcohol level:  Lab Results  Component Value Date   ETH <10 05/21/2020   ETH <10 [id number]    Metabolic Disorder Labs: Lab Results  Component Value Date   HGBA1C 6.0 (H) 10/02/2019   MPG 125.5 10/02/2019   No results found for: PROLACTIN Lab Results  Component Value Date   CHOL 157 05/21/2020   TRIG 70 05/21/2020   HDL 49 05/21/2020   CHOLHDL 3.2 05/21/2020   VLDL 14 05/21/2020   LDLCALC 94 05/21/2020   LDLCALC 168 (H) 10/02/2019    Therapeutic Lab Levels: Lab Results  Component Value Date   LITHIUM 0.13 (L) 10/01/2019   LITHIUM 0.21 (L) 09/20/2019   Lab Results  Component Value Date   VALPROATE <10 (L) 05/21/2020   No components found for:  CBMZ  Physical Findings   AUDIT   Flowsheet  Row Admission (Discharged) from 03/28/2013 in [facility]  Alcohol Use Disorder Identification Test Final Score (AUDIT) 0    CAGE-AID   Flowsheet Row ED to Hosp-Admission (Discharged) from 10/01/2019 in [facility]  CAGE-AID Score 1    Mini-Mental   Flowsheet Row ED to Hosp-Admission (Discharged) from 12/23/2018 in [facility]  Total Score (max 30 points ) 20       Musculoskeletal  Strength & Muscle Tone: within normal limits Gait & Station: normal  Patient leans: N/A  Psychiatric Specialty Exam  Presentation  General Appearance: Casual; Disheveled; Other (comment) (dancing around the millieu)  Eye Contact:Fair  Speech:Garbled; Pressured  Speech Volume:Increased  Handedness:Right   Mood and Affect  Mood:Anxious; Labile  Affect:Labile   Thought Process  Thought Processes:Disorganized; Linear (generally linear, disorganized at times)  Descriptions of Associations:Tangential  Orientation:Full (Time, Place and Person)  Thought Content:Tangential  Hallucinations:Hallucinations: None  Ideas of Reference:None  Suicidal Thoughts:Suicidal Thoughts: No  Homicidal Thoughts:Homicidal Thoughts: No   Sensorium  Memory:Immediate Good; Recent Fair  Judgment:Fair  Insight:Present; 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 She is normal weight.   HENT:     Head: Normocephalic and atraumatic.  Eyes:     Extraocular Movements: Extraocular movements intact.  Pulmonary:     Effort: Pulmonary effort is normal.  Neurological:     Mental Status: She is alert.    Review of Systems  Unable to perform ROS: Psychiatric disorder   Blood pressure (!) 174/104, pulse (!) 104, temperature 99.1 F (37.3 C), temperature source Oral, resp. rate 18, SpO2 99 %. There is no height or weight on file to calculate BMI.  Treatment Plan Summary: Daily contact with patient to assess and evaluate symptoms and progress in treatment and Medication management  Zyprexa Zydis 10 mg nightly for mood stabilization   -per chart was previously on 10 mg/20 mg- consider increasing dose if clinically indicated/approrpiate Depakote 500 mg twice daily for mood stabilization Ingrezza 40 mg nightly for tardive dyskinesia  As needed for agitation Geodon 10 mg/Ativan 2 mg IM once as needed Vistaril 25 mg 3 times daily as needed anxiety  Lipitor 40 mg daily chewable aspirin 324 mg Protonix 20 mg daily for GERD  Patient continues to meet criteria for inpatient admission. Information has been faxed out.  Ival Bible, MD 05/22/2020 1:02 PM

## 2020-05-22 NOTE — ED Notes (Addendum)
Pt pacing unit dancing, speech unintelligible but denies needs. Will continue to monitor for safety.

## 2020-05-22 NOTE — Progress Notes (Signed)
Pt. meets criteria for inpatient treatment per Dr. Serafina Mitchell.  Referred out to the following hospitals:  [facility]  Disposition CSW will continue to follow for placement.   Freddi Che, LCSW Clinical Social Worker - Disposition [phone]

## 2020-05-22 NOTE — ED Notes (Signed)
Pt eating sandwich, chips and drinking soda. Denies concerns at present. Will continue to monitor for safety.

## 2020-05-23 MED ORDER — OLANZAPINE 10 MG PO TBDP
10.0000 mg | ORAL_TABLET | Freq: Every day | ORAL | 0 refills | Status: DC
  Filled 2020-09-20: qty 30, 30d supply, fill #0

## 2020-05-23 MED ORDER — DIVALPROEX SODIUM 500 MG PO DR TAB: 500.0000 mg | DELAYED_RELEASE_TABLET | Freq: Two times a day (BID) | ORAL | Status: DC

## 2020-05-23 NOTE — ED Notes (Signed)
[employer] arrived to serve papers. Sheriff then arrived to transport patient to [facility]. Bernadette RN @ [facility] notified that she was on the way.

## 2020-05-23 NOTE — Progress Notes (Signed)
Pt accepted to [facility]     Dr. Alcide Clever is the accepting/attending provider.    Call report to [phone]  Lorra @ [facility] notified.     Pt will need to be placed under IVC and transported by law enforcement due to behaviors, per Christinia Gully in [facility] admissions.   Pt may arrive after completed IVC paperwork is faxed to [facility].    Audree Camel, LCSW, [place] Social Worker II Disposition CSW [phone]

## 2020-05-23 NOTE — ED Notes (Signed)
Pt continue with slurred speech. Give head gestures. Denies SI/HI/AVH. Denies concerns at present. Paces room, dancing. Safety maintained.

## 2020-05-23 NOTE — ED Notes (Signed)
Pt resting in no acute distress. RR even and unlabored. Safety maintained. 

## 2020-05-23 NOTE — ED Notes (Signed)
Pt incontinent. Bed was stripped and cleaned. Pt escorted to the bathroom for shower. Difficulty with ADLs.

## 2020-05-23 NOTE — Progress Notes (Signed)
CSW phone Morey Hummingbird with [place] to make her aware that Pt has been accepted to [facility].    Freddi Che, LCSW Clinical Social Worker - Disposition  [phone]

## 2020-05-23 NOTE — ED Notes (Signed)
Pt given meal

## 2020-05-23 NOTE — Progress Notes (Signed)
CSW received a call from Patriciaann Clan [phone] with [place].   Morey Hummingbird stated that there is a current Adult Protective Services case open for Pt, however APS worker has not been able to meet with Pt in order to complete and an assessment and further identify needs.   Morey Hummingbird was made aware by Pt's friend / roommate, Fredonia Highland that he did drop her off here at the [facility] due to her exhibiting bizarre behavior / having altered mental status. CSW did confirm that Pt did in fact exhibit symptoms consistent with an altered mental status and meets criteria for inpatient behavioral health treatment. Morey Hummingbird did have questions about current plans, since Pt has been admitted here. CSW explained that we are awaiting placement to a behavioral health hospital for treatment.   Morey Hummingbird asked that we make her aware of where Pt is admitted so that she can continue to make attempts to assess Pt, as necessary.     Freddi Che, LCSW Clinical Social Worker - Disposition [phone]

## 2020-05-23 NOTE — ED Provider Notes (Addendum)
FBC/OBS ASAP Discharge Summary  Date and Time: 05/23/2020 1:10 PM  Name: Karen Best  MRN:  [id number]   Discharge Diagnoses:  Final diagnoses:  Schizoaffective disorder, bipolar type ([place])    Subjective: Medication compliant. No PRNs needed in the last 24 hours. Patient slept most of the morning, this is the first day that patient has been here and gotten any substantial amount of sleep. Patient is still restless although less so; she is no longer dancing around the unit on my assessment but has been observed dancing on the unit by other staff members.  She continues to be disorganized and appears to be RIS at times. Denies SI/HI/AVH.  Stay Summary:   Karen Best, 65 y.o., female patient with a history of schizoaffective disorder bipolar type presents to [facility] as a walk-in accompanied by her landlord with complaints of manic behavior and offer her medications for 2.5 months.  Patient seen face to face by this provider, consulted with Dr. Serafina Mitchell; and chart reviewed on 05/21/20.  On evaluation Karen Best asked what brought her to urgent care and patient stated she went to [facility] to get her medications and told her that and have any medicine for her.  Reporting that she does not know where her act team is and cannot find her food stamps.  Patient is not a good historian related to pressured speech and difficult to understand most were spoken at this point.  Patient's landlord reports that patient's behavior worsened about 5 days ago where patient is not sleeping, constant pacing and walking around, and dancing nonstop.  Landlord also reports that patient speech is usually clear that he is right now.  Reports patient was living in a nursing facility after her stroke for about a year about 2 months ago patient requested she wanted to be discharged and have her own resident.  Landlord states he has been knowing patient for while and when she wanted to be discharged and have all  resident he will outpatient to come live with him in his private residence until the boardinghouse is ready for patient to move in which should be ready by next week.  Reports when patient was discharged home she was not sent home with any medications and has not had an ACTT team come by the house.  Reports he took her to the hospital about a week ago try to get her back on the medications but she was still discharged home with no meds and was not scheduled for any outpatient psychiatric services.  Reports he is unable to keep her in a home with her current behavior related to no one being able to sleep and having unlocked the bedroom doors to keep patient from coming in at night.  States he feels patient will be fine when she is on her medications and if he can get her set up with outpatient services.She was started on zyprexa and depakote for bipolar disorder and restarted on home ingrezza; however she was unable to get ingrezza while at the [facility] since she did not bring it with her from home.   Patient spent 2 nights at the [facility], on the  Night of 1/25, patient was able to get substantial sleep and was less restless, no longer dancing around the milieu. However, she remained disorganized (see above) and met criteria for inpatient admission- she was accepted by [facility].   Total Time spent with patient: 20 minutes  Past Psychiatric History: see H&P Past  Medical History:  Past Medical History:  Diagnosis Date  . Asthma   . Bipolar 1 disorder ([place])   . Fibromyalgia   . Schizophrenia ([place])   . Tobacco abuse     Past Surgical History:  Procedure Laterality Date  . CESAREAN SECTION    . TUBAL LIGATION     Family History:  Family History  Problem Relation Age of Onset  . Liver cancer Neg Hx   . Liver disease Neg Hx    Family Psychiatric History: see H&P Social History:  Social History   Substance and Sexual Activity  Alcohol Use Yes   Comment: occ     Social History   Substance and  Sexual Activity  Drug Use Yes  . Types: Cocaine   Comment: crack cocaine (last use 10/25/2018)    Social History   Socioeconomic History  . Marital status: Legally Separated    Spouse name: Not on file  . Number of children: Not on file  . Years of education: Not on file  . Highest education level: Not on file  Occupational History  . Not on file  Tobacco Use  . Smoking status: Current Every Day Smoker    Packs/day: 0.50    Types: Cigarettes  . Smokeless tobacco: Never Used  Vaping Use  . Vaping Use: Never used  Substance and Sexual Activity  . Alcohol use: Yes    Comment: occ  . Drug use: Yes    Types: Cocaine    Comment: crack cocaine (last use 10/25/2018)  . Sexual activity: Yes    Birth control/protection: None  Other Topics Concern  . Not on file  Social History Narrative  . Not on file   Social Determinants of Health   Financial Resource Strain: Not on file  Food Insecurity: Not on file  Transportation Needs: Not on file  Physical Activity: Not on file  Stress: Not on file  Social Connections: Not on file   SDOH:  SDOH Screenings   Alcohol Screen: Not on file  Depression (NGE9-5): Not on file  Financial Resource Strain: Not on file  Food Insecurity: Not on file  Housing: Not on file  Physical Activity: Not on file  Social Connections: Not on file  Stress: Not on file  Tobacco Use: High Risk  . Smoking Tobacco Use: Current Every Day Smoker  . Smokeless Tobacco Use: Never Used  Transportation Needs: Not on file    Has this patient used any form of tobacco in the last 30 days? (Cigarettes, Smokeless Tobacco, Cigars, and/or Pipes) Prescription not provided because: n/a  Current Medications:  Current Facility-Administered Medications  Medication Dose Route Frequency Provider Last Rate Last Admin  . acetaminophen (TYLENOL) tablet 650 mg  650 mg Oral Q6H PRN Rankin, Shuvon B, NP      . alum & mag hydroxide-simeth (MAALOX/MYLANTA) 200-200-20 MG/5ML  suspension 30 mL  30 mL Oral Q4H PRN Rankin, Shuvon B, NP      . aspirin chewable tablet 324 mg  324 mg Oral Daily Rankin, Shuvon B, NP   324 mg at 05/23/20 1044  . atorvastatin (LIPITOR) tablet 40 mg  40 mg Oral Daily Rankin, Shuvon B, NP   40 mg at 05/23/20 1044  . divalproex (DEPAKOTE) DR tablet 500 mg  500 mg Oral Q12H Rankin, Shuvon B, NP   500 mg at 05/23/20 1044  . magnesium hydroxide (MILK OF MAGNESIA) suspension 30 mL  30 mL Oral Daily PRN Rankin, Shuvon B, NP      .  OLANZapine zydis (ZYPREXA) disintegrating tablet 10 mg  10 mg Oral QHS Rankin, Shuvon B, NP   10 mg at 05/22/20 2214  . pantoprazole (PROTONIX) EC tablet 20 mg  20 mg Oral BID Rankin, Shuvon B, NP   20 mg at 05/23/20 1044  . traZODone (DESYREL) tablet 50 mg  50 mg Oral QHS,MR X 1 Lindon Romp A, NP   50 mg at 05/22/20 2215  . Valbenazine Tosylate CAPS 40 mg  40 mg Oral QHS Rankin, Shuvon B, NP       Current Outpatient Medications  Medication Sig Dispense Refill  . aspirin 81 MG chewable tablet Chew 4 tablets (324 mg total) by mouth daily. (Patient not taking: Reported on 05/13/2020)    . atorvastatin (LIPITOR) 40 MG tablet Take 1 tablet (40 mg total) by mouth daily. (Patient not taking: Reported on 05/13/2020) 30 tablet 11  . divalproex (DEPAKOTE) 500 MG DR tablet Take 1 tablet (500 mg total) by mouth every 12 (twelve) hours.    . famotidine (PEPCID) 40 MG tablet Take 40 mg by mouth 2 (two) times daily. (Patient not taking: Reported on 05/13/2020)    . ibuprofen (ADVIL) 200 MG tablet Take 400 mg by mouth every 6 (six) hours as needed for headache or mild pain. (Patient not taking: Reported on 05/13/2020)    . OLANZapine zydis (ZYPREXA) 10 MG disintegrating tablet Take 1 tablet (10 mg total) by mouth at bedtime.    Min[place] Tosylate (INGREZZA) 80 MG CAPS Take 40 mg by mouth at bedtime. (Patient not taking: No sig reported) 30 capsule     PTA Medications: (Not in a hospital admission)   Musculoskeletal  Strength & Muscle  Tone: within normal limits Gait & Station: normal Patient leans: N/A  Psychiatric Specialty Exam  Presentation  General Appearance: Disheveled; Casual  Eye Contact:Fair  Speech:Garbled; Other (comment) (rapid, less pressured)  Speech Volume:Increased  Handedness:Right   Mood and Affect  Mood:Anxious  Affect:Labile   Thought Process  Thought Processes:Linear; Disorganized (at times disorganized, becoming more linear)  Descriptions of Associations:Tangential  Orientation:Partial (oriented to self and city. 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 She is normal weight.  HENT:     Head: Normocephalic and atraumatic.  Eyes:     Extraocular Movements: Extraocular movements intact.  Pulmonary:     Effort: Pulmonary effort is normal.  Neurological:     Mental Status: She is alert.    Review of Systems  Constitutional: Negative for chills and fever.  Respiratory: Negative for cough.   Cardiovascular: Negative for chest pain.  Gastrointestinal: Negative for abdominal pain.  Musculoskeletal: Negative for myalgias.  Neurological: Negative for headaches.   Blood pressure 139/85, pulse 76,  temperature 98 F (36.7 C), temperature source Oral, resp. rate 18, SpO2 100 %. There is no height or weight on file to calculate BMI.  Demographic Factors:  Low socioeconomic status and Unemployed  Loss Factors: Financial problems/change in socioeconomic status  Historical Factors: NA  Risk Reduction Factors:   Living with another person, especially a relative and Positive social support  Continued Clinical Symptoms:  Bipolar Disorder:   Currently manic Currently Psychotic  Cognitive Features That Contribute To Risk:  Loss of executive function    Suicide Risk:  Minimal: No identifiable suicidal ideation.  Patients presenting with no risk factors but with morbid ruminations; may be classified as minimal risk based on the severity of the depressive symptoms  Plan Of Care/Follow-up recommendations:  [facility]   Dis[facility]: [facility] to [facility], MD 05/23/2020, 1:10 PM

## 2020-05-23 NOTE — ED Notes (Signed)
Pt sleeping'@this'$  time. breathing even and unlabored. Will continue to monitor for safety

## 2020-05-23 NOTE — Progress Notes (Signed)
Pt continues to meet criteria for inpatient treatment.   CSW resubmitted fax referral to following facilities for review:  [facility]   Disposition CSW will continue to assist in seeking appropriate bed placement.    Freddi Che, LCSW Clinical Social Worker - Disposition  [phone]

## 2020-06-27 ENCOUNTER — Ambulatory Visit (HOSPITAL_COMMUNITY): Payer: Medicaid Other | Admitting: Licensed Clinical Social Worker

## 2020-06-28 ENCOUNTER — Ambulatory Visit (HOSPITAL_COMMUNITY): Payer: Medicaid Other | Admitting: Psychiatry

## 2020-09-10 ENCOUNTER — Ambulatory Visit (HOSPITAL_COMMUNITY)
Admission: EM | Admit: 2020-09-10 | Discharge: 2020-09-10 | Disposition: A | Discharge status: Home / Self Care | Payer: Medicaid Other | Attending: Psychiatry | Admitting: Psychiatry

## 2020-09-10 ENCOUNTER — Other Ambulatory Visit: Payer: Self-pay

## 2020-09-10 DIAGNOSIS — Z9114 Patient's other noncompliance with medication regimen: Secondary | ICD-10-CM

## 2020-09-10 DIAGNOSIS — F250 Schizoaffective disorder, bipolar type: Secondary | ICD-10-CM | POA: Insufficient documentation

## 2020-09-10 DIAGNOSIS — Z91138 Patient's unintentional underdosing of medication regimen for other reason: Secondary | ICD-10-CM | POA: Insufficient documentation

## 2020-09-10 DIAGNOSIS — T43596A Underdosing of other antipsychotics and neuroleptics, initial encounter: Secondary | ICD-10-CM | POA: Insufficient documentation

## 2020-09-10 NOTE — ED Notes (Signed)
Pt discharged with resources provided on AVS. Safety maintained.

## 2020-09-10 NOTE — ED Provider Notes (Signed)
Behavioral Health Urgent Care Medical Screening Exam  Patient Name: Karen Best MRN: [id number] Date of Evaluation: 09/10/20 Chief Complaint:   Diagnosis:  Final diagnoses:  Schizoaffective disorder, bipolar type ([place])  Noncompliance with medications    History of Present illness: Karen Best is a 65 y.o. female.  Patient presents to the [facility] voluntarily as a walk-in.  Patient reports that she knows that she has been having hallucinations of people bothering her but she understands that it is not real and that the hallucination.  She states that she is supposed to be on medications but they have not been sent to the pharmacy.  She states that her doctor told her that he was switching her to Seroquel from Zyprexa but she has not started it because the pharmacy does not have it.  Patient at first is reporting that she is here to get restarted on her medications.  Patient denies any suicidal or homicidal ideations.  Patient does continue to have logical thinking and understand that she just needs to get back on her medications.  She is informed that her friend that drove her here had believed that her medications are at the pharmacy.  Patient states that she does not need this to help her with anything except a ride to the pharmacy.  Transportation was arranged via safe transport to [facility] on [address] and the patient stated that she would take herself home from there.  Patient is alert and oriented, presents with logical thinking, does not appear in to be paranoid or acutely psychotic.  Patient is pleasant, calm, cooperative.  Patient's speech is difficult to understand at times due to her garbled speech.  Patient continued to deny any suicidal or homicidal ideations and does not feel that she needs to be admitted and just wants to get her medications from the pharmacy and go home.  Psychiatric Specialty Exam  Presentation  General Appearance:Appropriate for Environment;  Casual  Eye Contact:Good  Speech:Garbled; Normal Rate  Speech Volume:Normal  Handedness:Right   Mood and Affect  Mood:Euthymic  Affect:Appropriate; Congruent   Thought Process  Thought Processes:Coherent  Descriptions of Associations:Intact  Orientation:Full (Time, Place and Person)  Thought Content:WDL  Diagnosis of Schizophrenia or Schizoaffective disorder in past: Yes  Duration of Psychotic Symptoms: Less than six months  Hallucinations:Auditory  Ideas of Reference:None  Suicidal Thoughts:No  Homicidal Thoughts:No   Sensorium  Memory:Immediate Good; Recent Good; Remote Good  Judgment:Good  Insight:Good   Executive Functions  Concentration:Good  Attention Span:Good  [place] of Knowledge:Good  Language:Good   Psychomotor Activity  Psychomotor Activity:Normal 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  Constitutional:      Appearance: She is well-developed.  HENT:     Head: Normocephalic.  Eyes:     Pupils: Pupils are equal, round, and reactive to light.  Cardiovascular:     Rate and Rhythm: Normal rate.  Pulmonary:     Effort: Pulmonary effort is normal.  Musculoskeletal:        General: Normal range of motion.  Neurological:     Mental Status: She is alert and oriented to person, place, and time.    Review of Systems  Constitutional: Negative.   HENT: Negative.   Eyes: Negative.  Respiratory: Negative.   Cardiovascular: Negative.   Gastrointestinal: Negative.   Genitourinary: Negative.   Musculoskeletal: Negative.   Skin: Negative.   Neurological: Negative.   Endo/Heme/Allergies: Negative.   Psychiatric/Behavioral: Positive for  hallucinations.   There were no vitals taken for this visit. There is no height or weight on file to calculate BMI.  Musculoskeletal: Strength & Muscle Tone: within normal limits Gait & Station: normal Patient leans: N/A   [place] MSE Discharge Disposition for Follow up and Recommendations: Based on my evaluation the patient does not appear to have an emergency medical condition and can be discharged with resources and follow up care in outpatient services for Medication Management and [place], FNP 09/10/2020, 3:44 PM

## 2020-09-10 NOTE — BH Assessment (Signed)
Patient reports to [facility] with friend and is unable to provide any details as to why she was sent here from the clinic . Patient is here with a friend who is unable to provide any detail either. Patient denies SI/ HI / AVH or substance use . Patient appears delayed but per friend stated she is not . Patient has not had her medication in weeks. Patient lives in boarding home. Patient is routine.

## 2020-09-10 NOTE — Discharge Instructions (Signed)

## 2020-09-18 ENCOUNTER — Encounter (HOSPITAL_COMMUNITY): Payer: Self-pay | Admitting: Emergency Medicine

## 2020-09-18 ENCOUNTER — Other Ambulatory Visit: Payer: Self-pay

## 2020-09-18 ENCOUNTER — Inpatient Hospital Stay (HOSPITAL_COMMUNITY)
Admission: EM | Admit: 2020-09-18 | Discharge: 2020-09-26 | DRG: 690 | Disposition: A | Discharge status: Home / Self Care | Payer: Medicaid Other | Attending: Internal Medicine | Admitting: Internal Medicine

## 2020-09-18 DIAGNOSIS — Z8673 Personal history of transient ischemic attack (TIA), and cerebral infarction without residual deficits: Secondary | ICD-10-CM

## 2020-09-18 DIAGNOSIS — Z91138 Patient's unintentional underdosing of medication regimen for other reason: Secondary | ICD-10-CM

## 2020-09-18 DIAGNOSIS — I10 Essential (primary) hypertension: Secondary | ICD-10-CM | POA: Diagnosis present

## 2020-09-18 DIAGNOSIS — G9340 Encephalopathy, unspecified: Secondary | ICD-10-CM | POA: Diagnosis not present

## 2020-09-18 DIAGNOSIS — Z781 Physical restraint status: Secondary | ICD-10-CM

## 2020-09-18 DIAGNOSIS — B9620 Unspecified Escherichia coli [E. coli] as the cause of diseases classified elsewhere: Secondary | ICD-10-CM | POA: Diagnosis present

## 2020-09-18 DIAGNOSIS — T43596A Underdosing of other antipsychotics and neuroleptics, initial encounter: Secondary | ICD-10-CM | POA: Diagnosis present

## 2020-09-18 DIAGNOSIS — N390 Urinary tract infection, site not specified: Principal | ICD-10-CM

## 2020-09-18 DIAGNOSIS — R410 Disorientation, unspecified: Secondary | ICD-10-CM

## 2020-09-18 DIAGNOSIS — F200 Paranoid schizophrenia: Secondary | ICD-10-CM | POA: Diagnosis present

## 2020-09-18 DIAGNOSIS — G9349 Other encephalopathy: Secondary | ICD-10-CM | POA: Diagnosis present

## 2020-09-18 DIAGNOSIS — F250 Schizoaffective disorder, bipolar type: Secondary | ICD-10-CM | POA: Diagnosis present

## 2020-09-18 DIAGNOSIS — F17210 Nicotine dependence, cigarettes, uncomplicated: Secondary | ICD-10-CM | POA: Diagnosis present

## 2020-09-18 DIAGNOSIS — T426X6A Underdosing of other antiepileptic and sedative-hypnotic drugs, initial encounter: Secondary | ICD-10-CM | POA: Diagnosis present

## 2020-09-18 DIAGNOSIS — Z20822 Contact with and (suspected) exposure to covid-19: Secondary | ICD-10-CM | POA: Diagnosis present

## 2020-09-18 DIAGNOSIS — Z79899 Other long term (current) drug therapy: Secondary | ICD-10-CM

## 2020-09-18 DIAGNOSIS — M797 Fibromyalgia: Secondary | ICD-10-CM | POA: Diagnosis present

## 2020-09-18 DIAGNOSIS — Z87440 Personal history of urinary (tract) infections: Secondary | ICD-10-CM

## 2020-09-18 DIAGNOSIS — Z7982 Long term (current) use of aspirin: Secondary | ICD-10-CM

## 2020-09-18 LAB — URINALYSIS, ROUTINE W REFLEX MICROSCOPIC
Bilirubin Urine: NEGATIVE
Glucose, UA: NEGATIVE mg/dL
Hgb urine dipstick: NEGATIVE
Ketones, ur: NEGATIVE mg/dL
Nitrite: POSITIVE — AB
Protein, ur: 30 mg/dL — AB
Specific Gravity, Urine: 1.018 (ref 1.005–1.030)
pH: 6 (ref 5.0–8.0)

## 2020-09-18 LAB — CBC WITH DIFFERENTIAL/PLATELET
Abs Immature Granulocytes: 0.04 10*3/uL (ref 0.00–0.07)
Basophils Absolute: 0 10*3/uL (ref 0.0–0.1)
Basophils Relative: 0 %
Eosinophils Absolute: 0.2 10*3/uL (ref 0.0–0.5)
Eosinophils Relative: 2 %
HCT: 48.4 % — ABNORMAL HIGH (ref 36.0–46.0)
Hemoglobin: 14.6 g/dL (ref 12.0–15.0)
Immature Granulocytes: 0 %
Lymphocytes Relative: 38 %
Lymphs Abs: 3.6 10*3/uL (ref 0.7–4.0)
MCH: 23 pg — ABNORMAL LOW (ref 26.0–34.0)
MCHC: 30.2 g/dL (ref 30.0–36.0)
MCV: 76.1 fL — ABNORMAL LOW (ref 80.0–100.0)
Monocytes Absolute: 0.9 10*3/uL (ref 0.1–1.0)
Monocytes Relative: 9 %
Neutro Abs: 4.8 10*3/uL (ref 1.7–7.7)
Neutrophils Relative %: 51 %
Platelets: 331 10*3/uL (ref 150–400)
RBC: 6.36 MIL/uL — ABNORMAL HIGH (ref 3.87–5.11)
RDW: 16.2 % — ABNORMAL HIGH (ref 11.5–15.5)
WBC: 9.6 10*3/uL (ref 4.0–10.5)
nRBC: 0 % (ref 0.0–0.2)

## 2020-09-18 LAB — COMPREHENSIVE METABOLIC PANEL
ALT: 32 U/L (ref 0–44)
AST: 33 U/L (ref 15–41)
Albumin: 3.8 g/dL (ref 3.5–5.0)
Alkaline Phosphatase: 81 U/L (ref 38–126)
Anion gap: 7 (ref 5–15)
BUN: 10 mg/dL (ref 8–23)
CO2: 24 mmol/L (ref 22–32)
Calcium: 9.9 mg/dL (ref 8.9–10.3)
Chloride: 108 mmol/L (ref 98–111)
Creatinine, Ser: 0.92 mg/dL (ref 0.44–1.00)
GFR, Estimated: >60 mL/min (ref >60)
Glucose, Bld: 104 mg/dL — ABNORMAL HIGH (ref 70–99)
Potassium: 4.6 mmol/L (ref 3.5–5.1)
Sodium: 139 mmol/L (ref 135–145)
Total Bilirubin: 0.6 mg/dL (ref 0.3–1.2)
Total Protein: 8 g/dL (ref 6.5–8.1)

## 2020-09-18 LAB — RAPID URINE DRUG SCREEN, HOSP PERFORMED
Amphetamines: NOT DETECTED
Barbiturates: NOT DETECTED
Benzodiazepines: NOT DETECTED
Cocaine: NOT DETECTED
Opiates: NOT DETECTED
Tetrahydrocannabinol: NOT DETECTED

## 2020-09-18 IMAGING — CT CT HEAD WITHOUT CONTRAST
4 series · 15 of 47 positions shown, 17 images · non-contrast
Comparison: 11/09/2018

CLINICAL DATA: Slurred speech for 1 hour

EXAM:
CT HEAD WITHOUT CONTRAST
TECHNIQUE: Contiguous axial images were obtained from the base of the skull
through the vertex without intravenous contrast.

[Series 3: head without · axial · non-contrast · 0.42mm/px · z∈[-80,+40]mm · 7 of 32 slices shown, 9 images]
[im 4/32  brain]
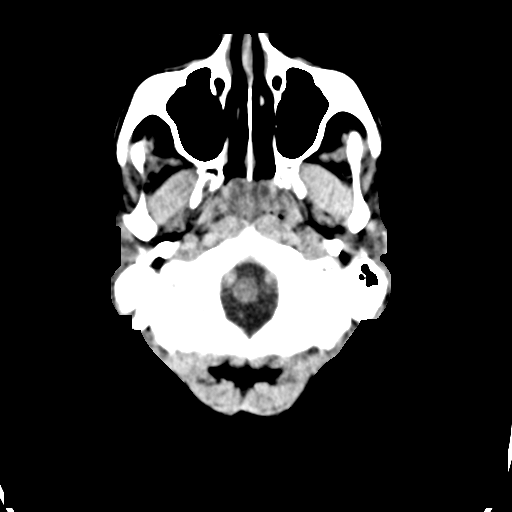
[im 4/32  bone]
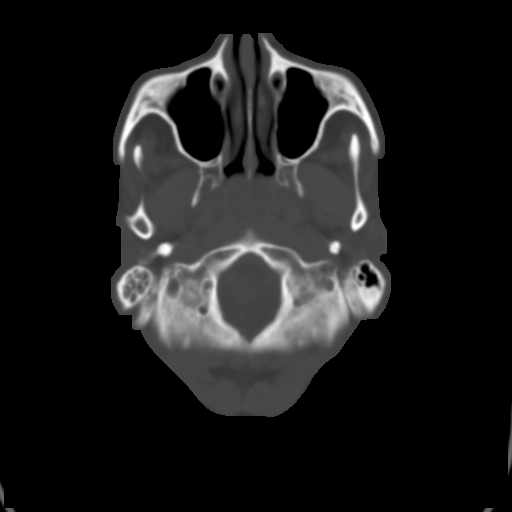
[im 8/32  brain]
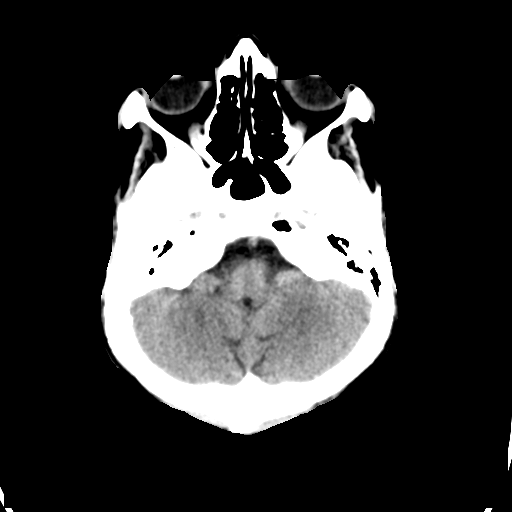
[im 12/32  brain]
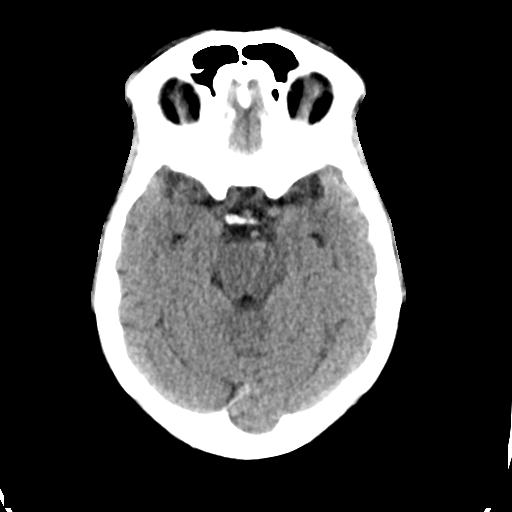
[im 16/32  brain]
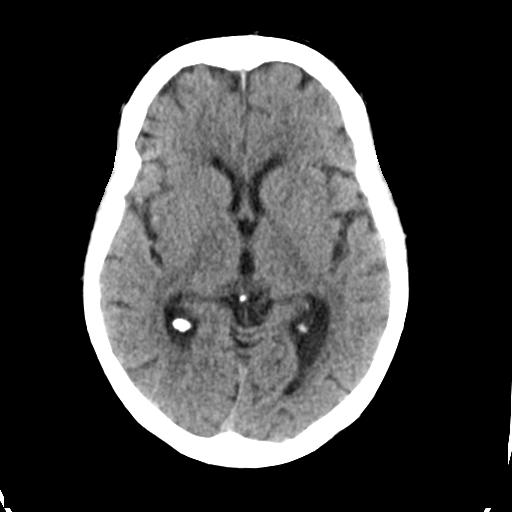
[im 20/32  brain]
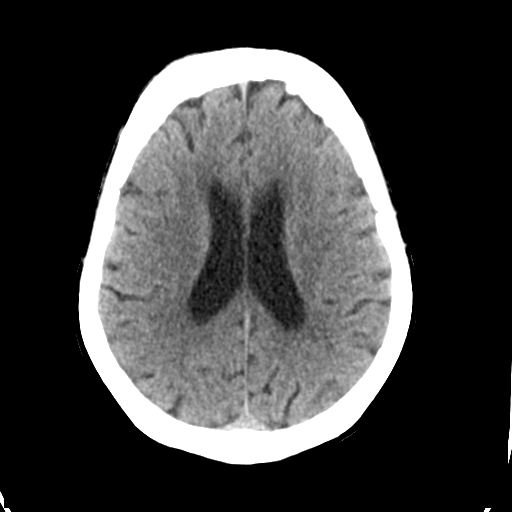
[im 20/32  bone]
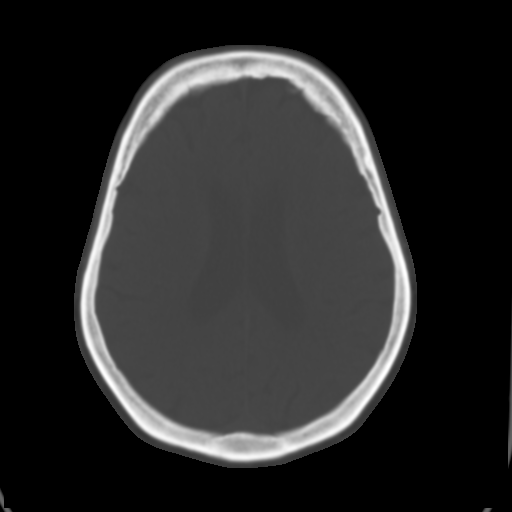
[im 24/32  brain]
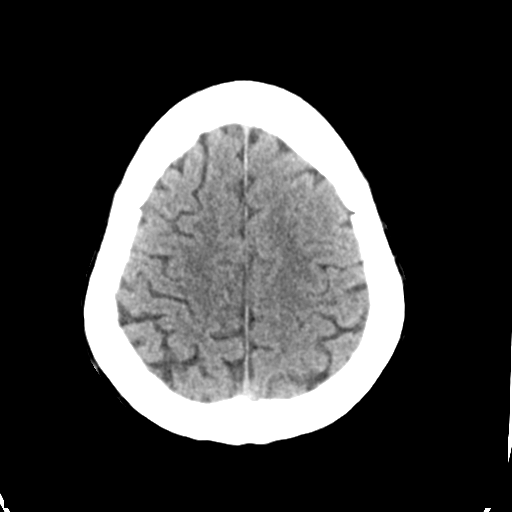
[im 28/32  brain]
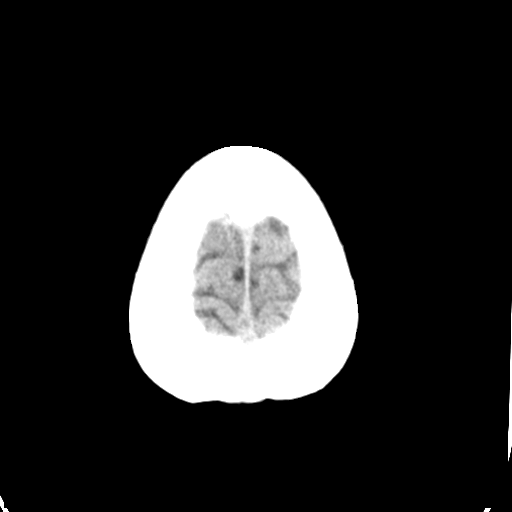

[Series 4: head bone · axial · 0.42mm/px · z∈[-81,-65]mm · 2 of 79 slices shown]
[im 8/79  bone]
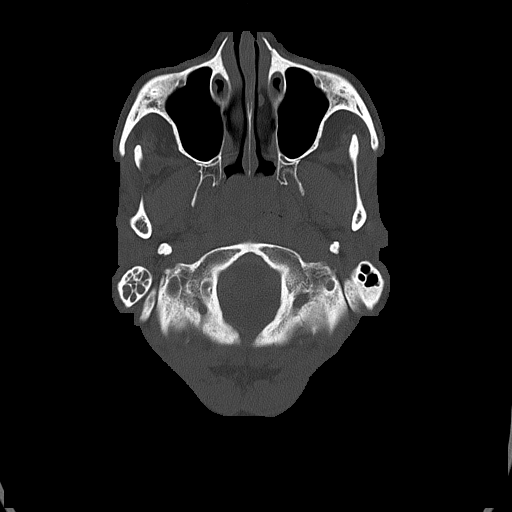
[im 16/79  bone]
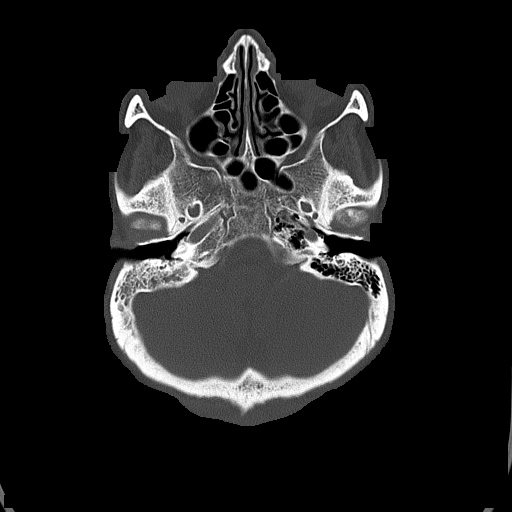

[Series 5: head without cor · coronal · non-contrast · 0.39mm/px · 3 of 74 slices shown]
[im 25/74  brain]
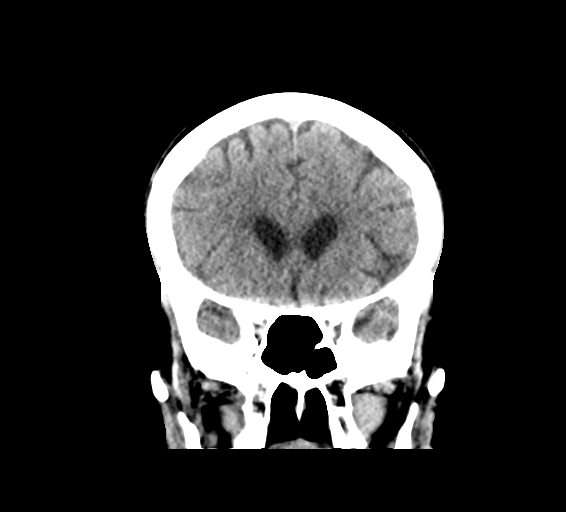
[im 33/74  brain]
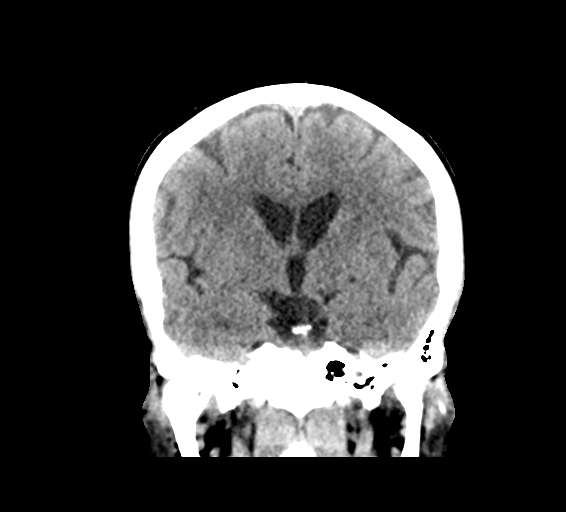
[im 41/74  brain]
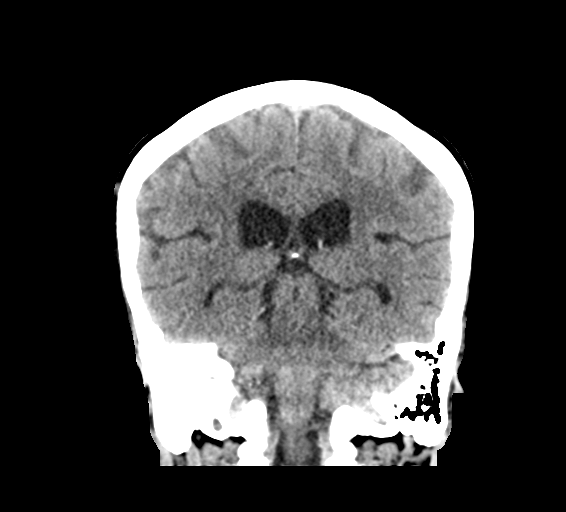

[Series 6: head without sag · sagittal · non-contrast · 0.31mm/px · 3 of 60 slices shown]
[im 20/60  brain]
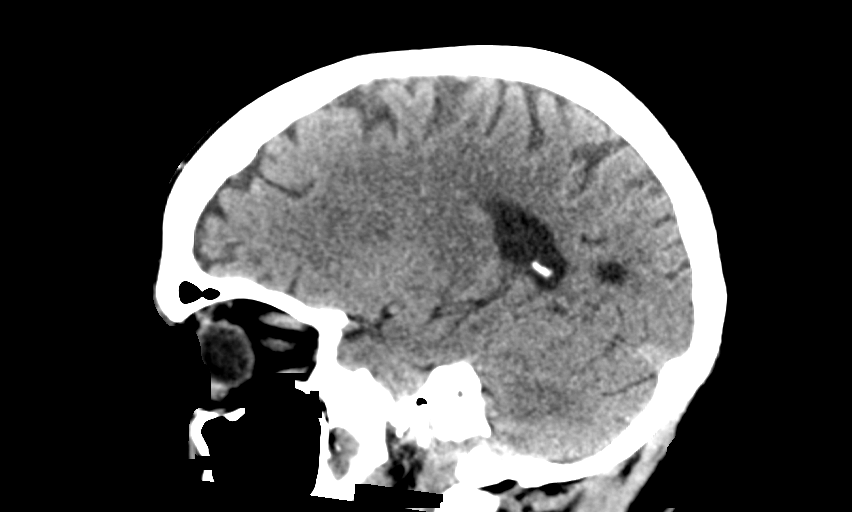
[im 30/60  brain]
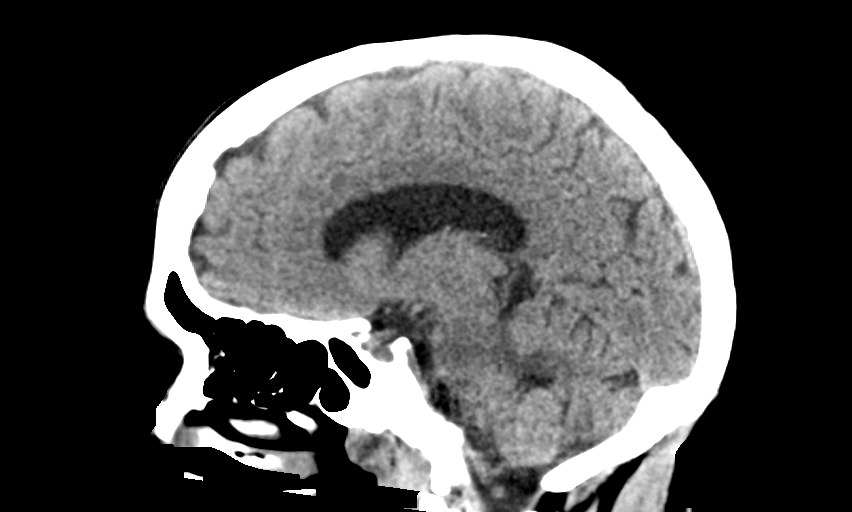
[im 40/60  brain]
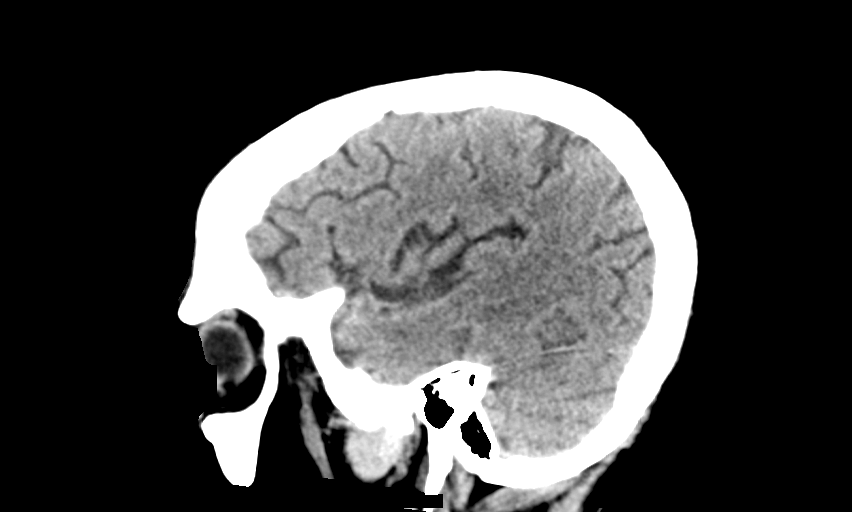

[15 of 47 positions shown; findings below may reference images not displayed]

FINDINGS: Brain: Mild atrophic changes are noted. No findings to suggest acute
hemorrhage, acute infarction or space-occupying mass lesion are
noted.

Vascular: No hyperdense vessel or unexpected calcification.

Skull: Normal. Negative for fracture or focal lesion.

Sinuses/Orbits: No acute finding.

Other: None.
IMPRESSION: Atrophic changes without acute abnormality.

## 2020-09-18 MED ORDER — SODIUM CHLORIDE 0.9 % IV SOLN: Freq: Once | INTRAVENOUS | Status: AC

## 2020-09-18 MED ORDER — SODIUM CHLORIDE 0.9 % IV SOLN
1.0000 g | Freq: Once | INTRAVENOUS | Status: AC
  Administered 2020-09-18: 1 g via INTRAVENOUS
  Filled 2020-09-18: qty 10

## 2020-09-18 MED ORDER — SODIUM CHLORIDE 0.9 % IV SOLN: INTRAVENOUS | Status: AC

## 2020-09-18 MED ORDER — SODIUM CHLORIDE 0.9 % IV SOLN
1.0000 g | INTRAVENOUS | Status: AC
  Administered 2020-09-20 – 2020-09-23 (×5): 1 g via INTRAVENOUS
  Filled 2020-09-18 (×5): qty 10

## 2020-09-18 MED ORDER — OLANZAPINE 10 MG PO TABS
10.0000 mg | ORAL_TABLET | Freq: Once | ORAL | Status: AC
  Administered 2020-09-18: 10 mg via ORAL
  Filled 2020-09-18: qty 1

## 2020-09-18 NOTE — H&P (Signed)
History and Physical    Karen Best EXB:[id number] DOB: 10-26-1955 DOA: 09/18/2020  PCP: [facility]  Patient coming from: Home.  Chief Complaint: Confusion.  HPI: Karen Best is a 65 y.o. female with history of previous stroke in June 2021, schizophrenia and bipolar disorder and previous history of substance abuse was brought to the ER after patient was found to be confused.  Does not know exactly when patient got confused and who found the patient and how patient was brought to the ER.  Patient has still does not remember who called EMS.  ED Course: In the ER patient is not agitated but appears confused but oriented to her name and place.  Patient is afebrile and UA is concerning for UTI.  No family contacts available in the chart.  At this time patient was started on ceftriaxone and admitted for further observation for acute encephalopathy secondary UTI.  CT head is pending.  COVID test is negative.  Review of Systems: As per HPI, rest all negative.   Past Medical History:  Diagnosis Date  . Asthma   . Bipolar 1 disorder ([place])   . Fibromyalgia   . Schizophrenia ([place])   . Tobacco abuse     Past Surgical History:  Procedure Laterality Date  . CESAREAN SECTION    . TUBAL LIGATION       reports that she has been smoking cigarettes. She has been smoking about 0.50 packs per day. She has never used smokeless tobacco. She reports current alcohol use. She reports current drug use. Drug: Cocaine.  No Known Allergies  Family History  Problem Relation Age of Onset  . Liver cancer Neg Hx   . Liver disease Neg Hx     Prior to Admission medications   Medication Sig Start Date End Date Taking? Authorizing Provider  aspirin 81 MG chewable tablet Chew 4 tablets (324 mg total) by mouth daily. Patient not taking: Reported on 05/13/2020 10/08/19   Barb Merino, MD  atorvastatin (LIPITOR) 40 MG tablet Take 1 tablet (40 mg total) by mouth  daily. Patient not taking: Reported on 05/13/2020 10/07/19 10/06/20  Barb Merino, MD  divalproex (DEPAKOTE) 500 MG DR tablet Take 1 tablet (500 mg total) by mouth every 12 (twelve) hours. 05/23/20   Ival Bible, MD  famotidine (PEPCID) 40 MG tablet Take 40 mg by mouth 2 (two) times daily. Patient not taking: Reported on 05/13/2020    [provider]  ibuprofen (ADVIL) 200 MG tablet Take 400 mg by mouth every 6 (six) hours as needed for headache or mild pain. Patient not taking: Reported on 05/13/2020    [provider]  OLANZapine zydis (ZYPREXA) 10 MG disintegrating tablet Take 1 tablet (10 mg total) by mouth at bedtime. 05/23/20   Ival Bible, MD  Valbenazine Tosylate [facility]) 80 MG CAPS Take 40 mg by mouth at bedtime. Patient not taking: No sig reported 10/07/19   Barb Merino, MD    Physical Exam: Constitutional: Moderately built and nourished. Vitals:   09/18/20 2145 09/18/20 2236 09/18/20 2249 09/18/20 2300  BP: (!) 121/16 115/65  106/86  Pulse: 74 75  77  Resp: (!) 24 16  20   Temp:   97.7 F (36.5 C)   TempSrc:   Oral   SpO2: 100% 100%  100%   Eyes: Anicteric no pallor. ENMT: No discharge from the ears eyes nose and mouth. Neck: No mass felt.  No neck rigidity. Respiratory: No  rhonchi or crepitations. Cardiovascular: S1-S2 heard. Abdomen: Soft nontender bowel sound present. Musculoskeletal: No edema. Skin: No rash. Neurologic: Alert awake oriented to name and place.  Moving all extremities. Psychiatric: Oriented to name and place.   Labs on Admission: I have personally reviewed following labs and imaging studies  CBC: Recent Labs  Lab 09/18/20 1734  WBC 9.6  NEUTROABS 4.8  HGB 14.6  HCT 48.4*  MCV 76.1*  PLT 034   Basic Metabolic Panel: Recent Labs  Lab 09/18/20 1734  NA 139  K 4.6  CL 108  CO2 24  GLUCOSE 104*  BUN 10  CREATININE 0.92  CALCIUM 9.9   GFR: CrCl cannot be calculated (Unknown ideal weight.). Liver  Function Tests: Recent Labs  Lab 09/18/20 1734  AST 33  ALT 32  ALKPHOS 81  BILITOT 0.6  PROT 8.0  ALBUMIN 3.8   No results for input(s): LIPASE, AMYLASE in the last 168 hours. No results for input(s): AMMONIA in the last 168 hours. Coagulation Profile: No results for input(s): INR, PROTIME in the last 168 hours. Cardiac Enzymes: No results for input(s): CKTOTAL, CKMB, CKMBINDEX, TROPONINI in the last 168 hours. BNP (last 3 results) No results for input(s): PROBNP in the last 8760 hours. HbA1C: No results for input(s): HGBA1C in the last 72 hours. CBG: No results for input(s): GLUCAP in the last 168 hours. Lipid Profile: No results for input(s): CHOL, HDL, LDLCALC, TRIG, CHOLHDL, LDLDIRECT in the last 72 hours. Thyroid Function Tests: No results for input(s): TSH, T4TOTAL, FREET4, T3FREE, THYROIDAB in the last 72 hours. Anemia Panel: No results for input(s): VITAMINB12, FOLATE, FERRITIN, TIBC, IRON, RETICCTPCT in the last 72 hours. Urine analysis:    Component Value Date/Time   COLORURINE AMBER (A) 09/18/2020 2200   APPEARANCEUR CLOUDY (A) 09/18/2020 2200   LABSPEC 1.018 09/18/2020 2200   PHURINE 6.0 09/18/2020 2200   GLUCOSEU NEGATIVE 09/18/2020 2200   HGBUR NEGATIVE 09/18/2020 2200   BILIRUBINUR NEGATIVE 09/18/2020 2200   KETONESUR NEGATIVE 09/18/2020 2200   PROTEINUR 30 (A) 09/18/2020 2200   UROBILINOGEN 0.2 10/26/2018 1355   NITRITE POSITIVE (A) 09/18/2020 2200   LEUKOCYTESUR TRACE (A) 09/18/2020 2200   Sepsis Labs: @LABRCNTIP (procalcitonin:4,lacticidven:4) )No results found for this or any previous visit (from the past 240 hour(s)).   Radiological Exams on Admission: No results found.  EKG: Independently reviewed.  Sinus tachycardia.  Assessment/Plan Principal Problem:   Acute encephalopathy Active Problems:   Acute UTI (urinary tract infection)   Schizoaffective disorder, bipolar type ([place])    1. Acute encephalopathy likely from UTI.  Patient at  this time is oriented to name and place.  Otherwise does not recall why she is here and who called EMS.  We need to check with her friends of family members when available to see baseline mental status.  At this time patient is placed on ceftriaxone for urine cultures.  CT head is pending.  Along with Depakote lithium levels and ammonia levels are also pending. 2. History of stroke appears nonfocal CT head is pending. 3. History of schizophrenia and bipolar disorder medications needs to be verified.  May need psychiatry consult to further assess home medications.  Patient's home medications are not reconciled and we need to confirm and restart accordingly.   DVT prophylaxis: Lovenox. Code Status: Full code. Family Communication: Unable to reach family. Disposition Plan: To be determined. Consults called: Psychiatry. Admission status: Observation.   Rise Patience MD [facility] Pager [phone].  If 7PM-7AM, please  contact night-coverage [email or web address] Password [facility]  09/18/2020, 11:39 PM

## 2020-09-18 NOTE — ED Triage Notes (Signed)
Pt here for altered mental status, pt states she doesn't feel well, but unable to say why. Pt with word salad, smells strongly of urine.

## 2020-09-18 NOTE — ED Provider Notes (Signed)
[facility] EMERGENCY DEPARTMENT Provider Note   CSN: [id number] Arrival date & time: 09/18/20  1558     History Chief Complaint  Patient presents with  . Altered Mental Status    Karen Best is a 65 y.o. female past medical history of schizophrenia, schizoaffective disorder bipolar type, CVA, presenting to the ED for evaluation of feeling unwell.  It is reported that patient arrived via [employer], however it is reported that [employer] left the patient at triage and nursing was unable to obtain any story from [employer] prior to their departure.  It is unclear why they were called and there is no history of prior to patient's arrival today.  She states however she does not feel well.  She endorses body aches.  Upon further discussion, patient is unable to provide adequate history.  She is tangential with disorganized speech though pleasant and in no distress.  Level 5 caveat secondary to status change.    The history is limited by the condition of the patient.       Past Medical History:  Diagnosis Date  . Asthma   . Bipolar 1 disorder ([place])   . Fibromyalgia   . Schizophrenia ([place])   . Tobacco abuse     Patient Active Problem List   Diagnosis Date Noted  . Acute encephalopathy 09/18/2020  . Schizoaffective disorder, bipolar type ([place]) 05/13/2020  . Bacteremia due to Staphylococcus aureus 10/03/2019  . Dehydration with hypernatremia 10/03/2019  . AKI (acute kidney injury) ([place]) 10/02/2019  . Transaminitis 10/02/2019  . CVA (cerebral vascular accident) ([place]) 10/02/2019  . AMS (altered mental status) 10/01/2019  . Lithium toxicity 02/16/2019  . Hyperkalemia 02/16/2019  . Acute confusion 12/29/2018  . Toxic metabolic encephalopathy 85/46/2703  . Dysarthria 12/23/2018  . Elevated blood pressure reading 12/23/2018  . Asthma 12/23/2018  . Delirium due to another medical condition   . UTI (urinary tract infection) 12/06/2018  . Acute metabolic encephalopathy 50/12/3816   . Positive hepatitis C antibody test 06/11/2018    Past Surgical History:  Procedure Laterality Date  . CESAREAN SECTION    . TUBAL LIGATION       OB History   No obstetric history on file.     Family History  Problem Relation Age of Onset  . Liver cancer Neg Hx   . Liver disease Neg Hx     Social History   Tobacco Use  . Smoking status: Current Every Day Smoker    Packs/day: 0.50    Types: Cigarettes  . Smokeless tobacco: Never Used  Vaping Use  . Vaping Use: Never used  Substance Use Topics  . Alcohol use: Yes    Comment: occ  . Drug use: Yes    Types: Cocaine    Comment: crack cocaine (last use 10/25/2018)    Home Medications Prior to Admission medications   Medication Sig Start Date End Date Taking? Authorizing Provider  aspirin 81 MG chewable tablet Chew 4 tablets (324 mg total) by mouth daily. Patient not taking: Reported on 05/13/2020 10/08/19   Barb Merino, MD  atorvastatin (LIPITOR) 40 MG tablet Take 1 tablet (40 mg total) by mouth daily. Patient not taking: Reported on 05/13/2020 10/07/19 10/06/20  Barb Merino, MD  divalproex (DEPAKOTE) 500 MG DR tablet Take 1 tablet (500 mg total) by mouth every 12 (twelve) hours. 05/23/20   Ival Bible, MD  famotidine (PEPCID) 40 MG tablet Take 40 mg by mouth 2 (two) times daily. Patient not taking:  Reported on 05/13/2020    [provider]  ibuprofen (ADVIL) 200 MG tablet Take 400 mg by mouth every 6 (six) hours as needed for headache or mild pain. Patient not taking: Reported on 05/13/2020    [provider]  OLANZapine zydis (ZYPREXA) 10 MG disintegrating tablet Take 1 tablet (10 mg total) by mouth at bedtime. 05/23/20   Ival Bible, MD  Valbenazine Tosylate [facility]) 80 MG CAPS Take 40 mg by mouth at bedtime. Patient not taking: No sig reported 10/07/19   Barb Merino, MD    Allergies    Patient has no known allergies.  Review of Systems   Review of Systems  Unable to  perform ROS: Mental status change    Physical Exam Updated Vital Signs BP 106/86   Pulse 77   Temp 97.7 F (36.5 C) (Oral)   Resp 20   SpO2 100%   Physical Exam Vitals and nursing note reviewed.  Constitutional:      General: She is not in acute distress.    Appearance: She is well-developed. She is not ill-appearing.     Comments: Poor hygiene. Pleasantly confused  HENT:     Head: Normocephalic and atraumatic.     Mouth/Throat:     Mouth: Mucous membranes are moist.  Eyes:     Conjunctiva/sclera: Conjunctivae normal.  Cardiovascular:     Rate and Rhythm: Normal rate and regular rhythm.  Pulmonary:     Effort: Pulmonary effort is normal. No respiratory distress.     Breath sounds: Normal breath sounds.  Abdominal:     General: Bowel sounds are normal.     Palpations: Abdomen is soft.     Tenderness: There is no abdominal tenderness.  Skin:    General: Skin is warm.  Neurological:     Mental Status: She is alert.     Comments: Speech is sometimes difficult to understand though patient is edentulous.   Strong and equal grip strength to upper extremities and lateral lower extremities with strong and equal dorsi flexion Normal tone. No obvious facial droop.  EOMs grossly normal  Psychiatric:        Attention and Perception: Attention normal.        Mood and Affect: Mood and affect normal.        Speech: Speech is tangential.        Behavior: Behavior normal.     Comments: Patient is in a pleasant mood.  Speech is tangential and disorganized.  Denies SI or HI.     ED Results / Procedures / Treatments   Labs (all labs ordered are listed, but only abnormal results are displayed) Labs Reviewed  URINALYSIS, ROUTINE W REFLEX MICROSCOPIC - Abnormal; Notable for the following components:      Result Value   Color, Urine AMBER (*)    APPearance CLOUDY (*)    Protein, ur 30 (*)    Nitrite POSITIVE (*)    Leukocytes,Ua TRACE (*)    Bacteria, UA MANY (*)    All other  components within normal limits  CBC WITH DIFFERENTIAL/PLATELET - Abnormal; Notable for the following components:   RBC 6.36 (*)    HCT 48.4 (*)    MCV 76.1 (*)    MCH 23.0 (*)    RDW 16.2 (*)    All other components within normal limits  COMPREHENSIVE METABOLIC PANEL - Abnormal; Notable for the following components:   Glucose, Bld 104 (*)    All other components within normal  limits  URINE CULTURE  RESP PANEL BY RT-PCR (FLU A&B, COVID) ARPGX2  RAPID URINE DRUG SCREEN, HOSP PERFORMED  ETHANOL  VALPROIC ACID LEVEL    EKG EKG Interpretation  Date/Time:  Tuesday Sep 18 2020 17:05:15 EDT Ventricular Rate:  105 PR Interval:  142 QRS Duration: 82 QT Interval:  336 QTC Calculation: 444 R Axis:   -79 Text Interpretation: Sinus tachycardia Right atrial enlargement Left axis deviation Pulmonary disease pattern Abnormal ECG Confirmed by Dene Gentry [phone]) on 09/18/2020 5:45:41 PM   Radiology No results found.  Procedures Procedures   Medications Ordered in ED Medications  cefTRIAXone (ROCEPHIN) 1 g in sodium chloride 0.9 % 100 mL IVPB (has no administration in time range)  OLANZapine (ZYPREXA) tablet 10 mg (has no administration in time range)  0.9 %  sodium chloride infusion (has no administration in time range)    ED Course  I have reviewed the triage vital signs and the nursing notes.  Pertinent labs & imaging results that were available during my care of the patient were reviewed by me and considered in my medical decision making (see chart for details).    MDM Rules/Calculators/A&P                          Patient with history of schizophrenia, schizoaffective disorder bipolar type, multiple psychiatric admissions, brought in today via [employer].  The story as to how patient arrived here is unknown, there was no history provided by [employer] before they departed the ED. She reports she is here for body aches and feeling unwell. She is unable to answer other questions  appropriately though. Patient's thoughts are tangential and disorganized to though she is pleasant and in no distress.    Blood work is reassuring.  UA however is consistent with UTI.  In consideration of patient's psych history as well as presenting complaint today, concern for altered mental status to be attributed to UTI.  Discussed with attending physician Dr. Francia Greaves.  At this time believe medical admission for IV antibiotics and observation is appropriate.   11:09 PM I am just now able to get a hold of Mr. Fredonia Highland  He is her landlord though also looks after her.  States she does not have any close family.  Has not seen her in 3 days though this is just been due to not coughing pads, he believes she has been at her home that the last few days.  He does state that when she does not take her medications she does not sleep and has mood changes.  She was well last time he saw her.  He would like to keep updated and was any further communication with him during her stay.   Final Clinical Impression(s) / ED Diagnoses Final diagnoses:  Disorientation  Acute UTI (urinary tract infection)    Rx / DC Orders ED Discharge Orders    None       Aletheia Tangredi, [place] N, PA-C 09/18/20 2309    Valarie Merino, MD 09/26/20 [phone]

## 2020-09-19 ENCOUNTER — Observation Stay (HOSPITAL_COMMUNITY): Payer: Medicaid Other

## 2020-09-19 DIAGNOSIS — F250 Schizoaffective disorder, bipolar type: Secondary | ICD-10-CM | POA: Diagnosis not present

## 2020-09-19 DIAGNOSIS — B9620 Unspecified Escherichia coli [E. coli] as the cause of diseases classified elsewhere: Secondary | ICD-10-CM | POA: Diagnosis present

## 2020-09-19 DIAGNOSIS — Z8673 Personal history of transient ischemic attack (TIA), and cerebral infarction without residual deficits: Secondary | ICD-10-CM | POA: Diagnosis not present

## 2020-09-19 DIAGNOSIS — I10 Essential (primary) hypertension: Secondary | ICD-10-CM | POA: Diagnosis present

## 2020-09-19 DIAGNOSIS — Z781 Physical restraint status: Secondary | ICD-10-CM | POA: Diagnosis not present

## 2020-09-19 DIAGNOSIS — F17210 Nicotine dependence, cigarettes, uncomplicated: Secondary | ICD-10-CM | POA: Diagnosis present

## 2020-09-19 DIAGNOSIS — R410 Disorientation, unspecified: Secondary | ICD-10-CM

## 2020-09-19 DIAGNOSIS — Z7982 Long term (current) use of aspirin: Secondary | ICD-10-CM | POA: Diagnosis not present

## 2020-09-19 DIAGNOSIS — Z87440 Personal history of urinary (tract) infections: Secondary | ICD-10-CM | POA: Diagnosis not present

## 2020-09-19 DIAGNOSIS — F200 Paranoid schizophrenia: Secondary | ICD-10-CM | POA: Diagnosis present

## 2020-09-19 DIAGNOSIS — Z79899 Other long term (current) drug therapy: Secondary | ICD-10-CM | POA: Diagnosis not present

## 2020-09-19 DIAGNOSIS — G9340 Encephalopathy, unspecified: Secondary | ICD-10-CM | POA: Diagnosis not present

## 2020-09-19 DIAGNOSIS — G9349 Other encephalopathy: Secondary | ICD-10-CM | POA: Diagnosis present

## 2020-09-19 DIAGNOSIS — T426X6A Underdosing of other antiepileptic and sedative-hypnotic drugs, initial encounter: Secondary | ICD-10-CM | POA: Diagnosis present

## 2020-09-19 DIAGNOSIS — Z91138 Patient's unintentional underdosing of medication regimen for other reason: Secondary | ICD-10-CM | POA: Diagnosis not present

## 2020-09-19 DIAGNOSIS — Z20822 Contact with and (suspected) exposure to covid-19: Secondary | ICD-10-CM | POA: Diagnosis present

## 2020-09-19 DIAGNOSIS — T43596A Underdosing of other antipsychotics and neuroleptics, initial encounter: Secondary | ICD-10-CM | POA: Diagnosis present

## 2020-09-19 DIAGNOSIS — N390 Urinary tract infection, site not specified: Secondary | ICD-10-CM | POA: Diagnosis present

## 2020-09-19 DIAGNOSIS — M797 Fibromyalgia: Secondary | ICD-10-CM | POA: Diagnosis present

## 2020-09-19 LAB — RETICULOCYTES
Immature Retic Fract: 10.9 % (ref 2.3–15.9)
RBC.: 5.5 MIL/uL — ABNORMAL HIGH (ref 3.87–5.11)
Retic Count, Absolute: 64.4 10*3/uL (ref 19.0–186.0)
Retic Ct Pct: 1.2 % (ref 0.4–3.1)

## 2020-09-19 LAB — FERRITIN: Ferritin: 25 ng/mL (ref 11–307)

## 2020-09-19 LAB — VALPROIC ACID LEVEL: Valproic Acid Lvl: <10 ug/mL — ABNORMAL LOW (ref 50.0–100.0)

## 2020-09-19 LAB — CBC
HCT: 41.5 % (ref 36.0–46.0)
Hemoglobin: 12.6 g/dL (ref 12.0–15.0)
MCH: 23 pg — ABNORMAL LOW (ref 26.0–34.0)
MCHC: 30.4 g/dL (ref 30.0–36.0)
MCV: 75.7 fL — ABNORMAL LOW (ref 80.0–100.0)
Platelets: 278 10*3/uL (ref 150–400)
RBC: 5.48 MIL/uL — ABNORMAL HIGH (ref 3.87–5.11)
RDW: 15.6 % — ABNORMAL HIGH (ref 11.5–15.5)
WBC: 7.6 10*3/uL (ref 4.0–10.5)
nRBC: 0 % (ref 0.0–0.2)

## 2020-09-19 LAB — COMPREHENSIVE METABOLIC PANEL
ALT: 28 U/L (ref 0–44)
AST: 31 U/L (ref 15–41)
Albumin: 2.9 g/dL — ABNORMAL LOW (ref 3.5–5.0)
Alkaline Phosphatase: 62 U/L (ref 38–126)
Anion gap: 7 (ref 5–15)
BUN: 11 mg/dL (ref 8–23)
CO2: 23 mmol/L (ref 22–32)
Calcium: 8.8 mg/dL — ABNORMAL LOW (ref 8.9–10.3)
Chloride: 109 mmol/L (ref 98–111)
Creatinine, Ser: 0.76 mg/dL (ref 0.44–1.00)
GFR, Estimated: >60 mL/min (ref >60)
Glucose, Bld: 113 mg/dL — ABNORMAL HIGH (ref 70–99)
Potassium: 3.9 mmol/L (ref 3.5–5.1)
Sodium: 139 mmol/L (ref 135–145)
Total Bilirubin: 0.5 mg/dL (ref 0.3–1.2)
Total Protein: 6.4 g/dL — ABNORMAL LOW (ref 6.5–8.1)

## 2020-09-19 LAB — HIV ANTIBODY (ROUTINE TESTING W REFLEX): HIV Screen 4th Generation wRfx: NONREACTIVE

## 2020-09-19 LAB — IRON AND TIBC
Iron: 80 ug/dL (ref 28–170)
Saturation Ratios: 19 % (ref 10.4–31.8)
TIBC: 423 ug/dL (ref 250–450)
UIBC: 343 ug/dL

## 2020-09-19 LAB — RESP PANEL BY RT-PCR (FLU A&B, COVID) ARPGX2
Influenza A by PCR: NEGATIVE
Influenza B by PCR: NEGATIVE
SARS Coronavirus 2 by RT PCR: NEGATIVE

## 2020-09-19 LAB — ETHANOL: Alcohol, Ethyl (B): <10 mg/dL (ref <10)

## 2020-09-19 LAB — AMMONIA: Ammonia: 21 umol/L (ref 9–35)

## 2020-09-19 LAB — LITHIUM LEVEL: Lithium Lvl: <0.06 mmol/L — ABNORMAL LOW (ref 0.60–1.20)

## 2020-09-19 LAB — FOLATE: Folate: 9.7 ng/mL (ref >5.9)

## 2020-09-19 LAB — VITAMIN B12: Vitamin B-12: 433 pg/mL (ref 180–914)

## 2020-09-19 MED ORDER — ENOXAPARIN SODIUM 40 MG/0.4ML IJ SOSY
40.0000 mg | PREFILLED_SYRINGE | Freq: Every day | INTRAMUSCULAR | Status: DC
  Administered 2020-09-20 – 2020-09-25 (×7): 40 mg via SUBCUTANEOUS
  Filled 2020-09-19 (×7): qty 0.4

## 2020-09-19 MED ORDER — DIVALPROEX SODIUM 250 MG PO DR TAB
500.0000 mg | DELAYED_RELEASE_TABLET | Freq: Two times a day (BID) | ORAL | Status: DC
  Administered 2020-09-20 – 2020-09-26 (×13): 500 mg via ORAL
  Filled 2020-09-19 (×13): qty 2

## 2020-09-19 MED ORDER — VALBENAZINE TOSYLATE 40 MG PO CAPS
40.0000 mg | ORAL_CAPSULE | Freq: Every day | ORAL | Status: DC
  Administered 2020-09-20 – 2020-09-25 (×6): 40 mg via ORAL
  Filled 2020-09-19 (×8): qty 1

## 2020-09-19 MED ORDER — OLANZAPINE 5 MG PO TBDP
10.0000 mg | ORAL_TABLET | Freq: Every day | ORAL | Status: DC
  Filled 2020-09-19 (×2): qty 2

## 2020-09-19 MED ORDER — HALOPERIDOL LACTATE 5 MG/ML IJ SOLN
1.0000 mg | Freq: Four times a day (QID) | INTRAMUSCULAR | Status: DC | PRN
  Administered 2020-09-19 – 2020-09-20 (×2): 2 mg via INTRAVENOUS
  Filled 2020-09-19 (×2): qty 1

## 2020-09-19 NOTE — Consult Note (Signed)
[facility] Consult   Reason for Consult:  Encephalopathic with history of schizophrenia Referring Physician:  Dr. Hal Hope Patient Identification: Karen Best MRN:  [id number] Principal Diagnosis: Acute encephalopathy Diagnosis:  Principal Problem:   Acute encephalopathy Active Problems:   Acute UTI (urinary tract infection)   Schizoaffective disorder, bipolar type ([place])   Total Time spent with patient: 30 minutes  Subjective:   Karen Best is a 65 y.o. female patient admitted with psychosis and altered mental status. Psych consult placed for history of schizophrenia, assistance with home emdications. Patient reports that she is here because she hasn't been on her medications. She states they stopped her Seroquel and started ZYprexa, but couldn't remembers why. "I like Seroquel I could do anything while on that medication." She is alert and oriented x 3, with some assistance. She presents with logical and clear thinking, and answers all questions appropriately. She is able to hold a linear conversation and does not appear to be responding to internal stimuli. She does not present as psychotic, and does not appear to be exhibiting delusional thinking. SHe is pleasant, calm, and cooperative, appears to be willing to engage and participate in nursing triage assessment. Patient denies suicidal ideation, homicidal ideations, and or hallucinations. She appears to be stabilizing fairly quickly since administration of the zyprexa upon admission to the ER. She was also treated for a UTI, with 1gram of Rocephin.   HPI:  Karen Best is a 65 y.o. female past medical history of schizophrenia, schizoaffective disorder bipolar type, CVA, presenting to the ED for evaluation of feeling unwell.  It is reported that patient arrived via [employer], however it is reported that [employer] left the patient at triage and nursing was unable to obtain any story from [employer] prior to their departure.  It  is unclear why they were called and there is no history of prior to patient's arrival today.  She states however she does not feel well.  She endorses body aches.  Upon further discussion, patient is unable to provide adequate history.  She is tangential with disorganized speech though pleasant and in no distress.  Level 5 caveat secondary to status change.  Past Psychiatric History:   Risk to Self:   Risk to Others:   Prior Inpatient Therapy:   Prior Outpatient Therapy:    Past Medical History:  Past Medical History:  Diagnosis Date  . Asthma   . Bipolar 1 disorder ([place])   . Fibromyalgia   . Schizophrenia ([place])   . Tobacco abuse     Past Surgical History:  Procedure Laterality Date  . CESAREAN SECTION    . TUBAL LIGATION     Family History:  Family History  Problem Relation Age of Onset  . Liver cancer Neg Hx   . Liver disease Neg Hx    Family Psychiatric  History: Unable to assess Social History:  Social History   Substance and Sexual Activity  Alcohol Use Yes   Comment: occ     Social History   Substance and Sexual Activity  Drug Use Yes  . Types: Cocaine   Comment: crack cocaine (last use 10/25/2018)    Social History   Socioeconomic History  . Marital status: Legally Separated    Spouse name: Not on file  . Number of children: Not on file  . Years of education: Not on file  . Highest education level: Not on file  Occupational History  . Not on file  Tobacco Use  .  Smoking status: Current Every Day Smoker    Packs/day: 0.50    Types: Cigarettes  . Smokeless tobacco: Never Used  Vaping Use  . Vaping Use: Never used  Substance and Sexual Activity  . Alcohol use: Yes    Comment: occ  . Drug use: Yes    Types: Cocaine    Comment: crack cocaine (last use 10/25/2018)  . Sexual activity: Yes    Birth control/protection: None  Other Topics Concern  . Not on file  Social History Narrative  . Not on file   Social Determinants of Health   Financial  Resource Strain: Not on file  Food Insecurity: Not on file  Transportation Needs: Not on file  Physical Activity: Not on file  Stress: Not on file  Social Connections: Not on file   Additional Social History:    Allergies:  No Known Allergies  Labs:  Results for orders placed or performed during the hospital encounter of 09/18/20 (from the past 48 hour(s))  CBC with Differential     Status: Abnormal   Collection Time: 09/18/20  5:34 PM  Result Value Ref Range   WBC 9.6 4.0 - 10.5 K/uL   RBC 6.36 (H) 3.87 - 5.11 MIL/uL   Hemoglobin 14.6 12.0 - 15.0 g/dL   HCT 48.4 (H) 36.0 - 46.0 %   MCV 76.1 (L) 80.0 - 100.0 fL   MCH 23.0 (L) 26.0 - 34.0 pg   MCHC 30.2 30.0 - 36.0 g/dL   RDW 16.2 (H) 11.5 - 15.5 %   Platelets 331 150 - 400 K/uL   nRBC 0.0 0.0 - 0.2 %   Neutrophils Relative % 51 %   Neutro Abs 4.8 1.7 - 7.7 K/uL   Lymphocytes Relative 38 %   Lymphs Abs 3.6 0.7 - 4.0 K/uL   Monocytes Relative 9 %   Monocytes Absolute 0.9 0.1 - 1.0 K/uL   Eosinophils Relative 2 %   Eosinophils Absolute 0.2 0.0 - 0.5 K/uL   Basophils Relative 0 %   Basophils Absolute 0.0 0.0 - 0.1 K/uL   Immature Granulocytes 0 %   Abs Immature Granulocytes 0.04 0.00 - 0.07 K/uL    Comment: Performed at [facility], [address], [place], [place] [address]  Comprehensive metabolic panel     Status: Abnormal   Collection Time: 09/18/20  5:34 PM  Result Value Ref Range   Sodium 139 135 - 145 mmol/L   Potassium 4.6 3.5 - 5.1 mmol/L   Chloride 108 98 - 111 mmol/L   CO2 24 22 - 32 mmol/L   Glucose, Bld 104 (H) 70 - 99 mg/dL    Comment: Glucose reference range applies only to samples taken after fasting for at least 8 hours.   BUN 10 8 - 23 mg/dL   Creatinine, Ser 0.92 0.44 - 1.00 mg/dL   Calcium 9.9 8.9 - 10.3 mg/dL   Total Protein 8.0 6.5 - 8.1 g/dL   Albumin 3.8 3.5 - 5.0 g/dL   AST 33 15 - 41 U/L   ALT 32 0 - 44 U/L   Alkaline Phosphatase 81 38 - 126 U/L   Total Bilirubin 0.6 0.3 - 1.2  mg/dL   GFR, Estimated >60 >60 mL/min    Comment: (NOTE) Calculated using the CKD-EPI Creatinine Equation (2021)    Anion gap 7 5 - 15    Comment: Performed at [facility] [address], [place], [place] [address]  Urinalysis, Routine w reflex microscopic  Urine, Clean Catch     Status: Abnormal   Collection Time: 09/18/20 10:00 PM  Result Value Ref Range   Color, Urine AMBER (A) YELLOW    Comment: BIOCHEMICALS MAY BE AFFECTED BY COLOR   APPearance CLOUDY (A) CLEAR   Specific Gravity, Urine 1.018 1.005 - 1.030   pH 6.0 5.0 - 8.0   Glucose, UA NEGATIVE NEGATIVE mg/dL   Hgb urine dipstick NEGATIVE NEGATIVE   Bilirubin Urine NEGATIVE NEGATIVE   Ketones, ur NEGATIVE NEGATIVE mg/dL   Protein, ur 30 (A) NEGATIVE mg/dL   Nitrite POSITIVE (A) NEGATIVE   Leukocytes,Ua TRACE (A) NEGATIVE   RBC / HPF 0-5 0 - 5 RBC/hpf   WBC, UA 6-10 0 - 5 WBC/hpf   Bacteria, UA MANY (A) NONE SEEN   Squamous Epithelial / LPF 0-5 0 - 5   Mucus PRESENT    Ca Oxalate Crys, UA PRESENT     Comment: Performed at [facility], [address], [place], [place] [address]  Urine rapid drug screen (hosp performed)     Status: None   Collection Time: 09/18/20 10:00 PM  Result Value Ref Range   Opiates NONE DETECTED NONE DETECTED   Cocaine NONE DETECTED NONE DETECTED   Benzodiazepines NONE DETECTED NONE DETECTED   Amphetamines NONE DETECTED NONE DETECTED   Tetrahydrocannabinol NONE DETECTED NONE DETECTED   Barbiturates NONE DETECTED NONE DETECTED    Comment: (NOTE) DRUG SCREEN FOR MEDICAL PURPOSES ONLY.  IF CONFIRMATION IS NEEDED FOR ANY PURPOSE, NOTIFY LAB WITHIN 5 DAYS.  LOWEST DETECTABLE LIMITS FOR URINE DRUG SCREEN Drug Class                     Cutoff (ng/mL) Amphetamine and metabolites    1000 Barbiturate and metabolites    200 Benzodiazepine                 381 Tricyclics and metabolites     300 Opiates and metabolites        300 Cocaine and metabolites        300 THC                             50 Performed at [facility], [place] [address] [place], [place] [address]   Ethanol     Status: None   Collection Time: 09/18/20 11:20 PM  Result Value Ref Range   Alcohol, Ethyl (B) <10 <10 mg/dL    Comment: (NOTE) Lowest detectable limit for serum alcohol is 10 mg/dL.  For medical purposes only. Performed at [facility], [place] [address], [place], [place] [address]   Valproic acid level     Status: Abnormal   Collection Time: 09/18/20 11:20 PM  Result Value Ref Range   Valproic Acid Lvl <10 (L) 50.0 - 100.0 ug/mL    Comment: RESULTS CONFIRMED BY MANUAL DILUTION Performed at [facility] [address] [place], [place] [address]   HIV Antibody (routine testing w rflx)     Status: None   Collection Time: 09/18/20 11:36 PM  Result Value Ref Range   HIV Screen 4th Generation wRfx Non Reactive Non Reactive    Comment: Performed at [facility], [place] [address], [place], [place] [address]  Ammonia     Status: None   Collection Time: 09/18/20 11:38 PM  Result Value Ref Range   Ammonia 21 9 - 35  umol/L    Comment: Performed at [facility], [place] [address] [place], [place] [address]  Lithium level     Status: Abnormal   Collection Time: 09/18/20 11:39 PM  Result Value Ref Range   Lithium Lvl <0.06 (L) 0.60 - 1.20 mmol/L    Comment: Performed at [facility] [address], [place], [place] [address]  Resp Panel by RT-PCR (Flu A&B, Covid) Nasopharyngeal Swab     Status: None   Collection Time: 09/18/20 11:44 PM   Specimen: Nasopharyngeal Swab; Nasopharyngeal(NP) swabs in vial transport medium  Result Value Ref Range   SARS Coronavirus 2 by RT PCR NEGATIVE NEGATIVE    Comment: (NOTE) SARS-CoV-2 target nucleic acids are NOT DETECTED.  The SARS-CoV-2 RNA is generally detectable in upper respiratory specimens during the acute phase of infection. The lowest concentration of SARS-CoV-2 viral copies this assay can detect is 138  copies/mL. A negative result does not preclude SARS-Cov-2 infection and should not be used as the sole basis for treatment or other patient management decisions. A negative result may occur with  improper specimen collection/handling, submission of specimen other than nasopharyngeal swab, presence of viral mutation(s) within the areas targeted by this assay, and inadequate number of viral copies(<138 copies/mL). A negative result must be combined with clinical observations, patient history, and epidemiological information. The expected result is Negative.  Fact Sheet for Patients:  EntrepreneurPulse.com.au  Fact Sheet for Healthcare Providers:  IncredibleEmployment.be  This test is no t yet approved or cleared by the [place] FDA and  has been authorized for detection and/or diagnosis of SARS-CoV-2 by FDA under an Emergency Use Authorization (EUA). This EUA will remain  in effect (meaning this test can be used) for the duration of the COVID-19 declaration under Section 564(b)(1) of the Act, 21 U.S.C.section 360bbb-3(b)(1), unless the authorization is terminated  or revoked sooner.       Influenza A by PCR NEGATIVE NEGATIVE   Influenza B by PCR NEGATIVE NEGATIVE    Comment: (NOTE) The Xpert Xpress SARS-CoV-2/FLU/RSV plus assay is intended as an aid in the diagnosis of influenza from Nasopharyngeal swab specimens and should not be used as a sole basis for treatment. Nasal washings and aspirates are unacceptable for Xpert Xpress SARS-CoV-2/FLU/RSV testing.  Fact Sheet for Patients: EntrepreneurPulse.com.au  Fact Sheet for Healthcare Providers: IncredibleEmployment.be  This test is not yet approved or cleared by the [place] FDA and has been authorized for detection and/or diagnosis of SARS-CoV-2 by FDA under an Emergency Use Authorization (EUA). This EUA will remain in effect (meaning this test  can be used) for the duration of the COVID-19 declaration under Section 564(b)(1) of the Act, 21 U.S.C. section 360bbb-3(b)(1), unless the authorization is terminated or revoked.  Performed at [facility], [place] [address], [place],  [address]   Comprehensive metabolic panel     Status: Abnormal   Collection Time: 09/19/20  2:48 AM  Result Value Ref Range   Sodium 139 135 - 145 mmol/L   Potassium 3.9 3.5 - 5.1 mmol/L   Chloride 109 98 - 111 mmol/L   CO2 23 22 - 32 mmol/L   Glucose, Bld 113 (H) 70 - 99 mg/dL    Comment: Glucose reference range applies only to samples taken after fasting for at least 8 hours.   BUN 11 8 - 23 mg/dL   Creatinine, Ser 0.76 0.44 - 1.00 mg/dL   Calcium 8.8 (L) 8.9 - 10.3 mg/dL  Total Protein 6.4 (L) 6.5 - 8.1 g/dL   Albumin 2.9 (L) 3.5 - 5.0 g/dL   AST 31 15 - 41 U/L   ALT 28 0 - 44 U/L   Alkaline Phosphatase 62 38 - 126 U/L   Total Bilirubin 0.5 0.3 - 1.2 mg/dL   GFR, Estimated >60 >60 mL/min    Comment: (NOTE) Calculated using the CKD-EPI Creatinine Equation (2021)    Anion gap 7 5 - 15    Comment: Performed at [facility] [address], [place], [place] [address]  CBC     Status: Abnormal   Collection Time: 09/19/20  2:48 AM  Result Value Ref Range   WBC 7.6 4.0 - 10.5 K/uL   RBC 5.48 (H) 3.87 - 5.11 MIL/uL   Hemoglobin 12.6 12.0 - 15.0 g/dL   HCT 41.5 36.0 - 46.0 %   MCV 75.7 (L) 80.0 - 100.0 fL   MCH 23.0 (L) 26.0 - 34.0 pg   MCHC 30.4 30.0 - 36.0 g/dL   RDW 15.6 (H) 11.5 - 15.5 %   Platelets 278 150 - 400 K/uL   nRBC 0.0 0.0 - 0.2 %    Comment: Performed at [facility], [place] [address] [place], [place] [address]  Reticulocytes     Status: Abnormal   Collection Time: 09/19/20  2:48 AM  Result Value Ref Range   Retic Ct Pct 1.2 0.4 - 3.1 %   RBC. 5.50 (H) 3.87 - 5.11 MIL/uL   Retic Count, Absolute 64.4 19.0 - 186.0 K/uL   Immature Retic Fract 10.9 2.3 - 15.9 %    Comment: Performed at [facility] [address], [place], [place] [address]  Vitamin B12     Status: None   Collection Time: 09/19/20  7:53 AM  Result Value Ref Range   Vitamin B-12 433 180 - 914 pg/mL    Comment: (NOTE) This assay is not validated for testing neonatal or myeloproliferative syndrome specimens for Vitamin B12 levels. Performed at [facility], [place] [address] [place], [place] [address]   Folate     Status: None   Collection Time: 09/19/20  7:53 AM  Result Value Ref Range   Folate 9.7 >5.9 ng/mL    Comment: Performed at [facility] [address], [place], [place] [address]  Iron and TIBC     Status: None   Collection Time: 09/19/20  7:53 AM  Result Value Ref Range   Iron 80 28 - 170 ug/dL   TIBC 423 250 - 450 ug/dL   Saturation Ratios 19 10.4 - 31.8 %   UIBC 343 ug/dL    Comment: Performed at [facility], [place] [address] [place], [place] [address]  Ferritin     Status: None   Collection Time: 09/19/20  7:53 AM  Result Value Ref Range   Ferritin 25 11 - 307 ng/mL    Comment: Performed at [facility], [place] [address], [place], [place] [address]    Current Facility-Administered Medications  Medication Dose Route Frequency Provider Last Rate Last Admin  . 0.9 %  sodium chloride infusion   Intravenous Continuous Rise Patience, MD 100 mL/hr at 09/19/20 [place] at 09/19/20 0020  . cefTRIAXone (ROCEPHIN) 1 g in sodium chloride 0.9 % 100 mL IVPB  1 g Intravenous Q24H Rise Patience, MD        Musculoskeletal: Strength & Muscle Tone: within  normal limits Gait & Station: Unable to assess Patient leans: N/A            Psychiatric Specialty Exam:  Presentation  General Appearance: Appropriate for Environment; Casual  Eye Contact:Good  Speech:Clear and Coherent; Normal Rate  Speech Volume:Normal  Handedness:Right   Mood and Affect  Mood:Euthymic  Affect:Appropriate; Congruent   Thought Process  Thought Processes:Coherent;  Linear  Descriptions of Associations:Intact  Orientation:Full (Time, Place and Person)  Thought Content:Logical  History of Schizophrenia/Schizoaffective disorder:Yes  Duration of Psychotic Symptoms:Less than six months  Hallucinations:Hallucinations: None  Ideas of Reference:None  Suicidal Thoughts:Suicidal Thoughts: No  Homicidal Thoughts:Homicidal Thoughts: No   Sensorium  Memory:Immediate Fair; Recent Fair; Remote Poor  Judgment:Poor  Insight:Poor   Executive Functions  Concentration:Fair  Attention Span:Fair  [place]   Psychomotor Activity  Psychomotor Activity:Psychomotor Activity: Normal   Assets  Assets:Communication Skills; Physical Health; Social Support; Desire for Improvement; Housing   Sleep  Sleep:Sleep: Fair   Physical Exam: Physical Exam ROS Blood pressure (!) 165/77, pulse 67, temperature 97.9 F (36.6 C), temperature source Oral, resp. rate 16, SpO2 100 %. There is no height or weight on file to calculate BMI.  Treatment Plan Summary: Plan Resume home medications at this time. Patient appears to be psychiatrically stable as she is no longer exhibiting disorganized thought process or speech. SHe does not appear acutely ill or psychotic. WIll pscyh clear at this time.  -Resume olanzapine and Depakote.  -referral to outpatient psychiatrist for medication management.  Disposition: No evidence of imminent risk to self or others at present.   Patient does not meet criteria for psychiatric inpatient admission. Needs referral to psychiatrist for medication management.   Suella Broad, FNP 09/19/2020 11:11 AM

## 2020-09-19 NOTE — Progress Notes (Addendum)
Pt becoming increasingly combative with staff, pt threw lunch tray including plate causing plate to shatter on floor, when this RN asked pt what happened she responded "you are trying to kill me." RN explained that we are not trying to kill her, and here to help her get better, and no one is going to harm her, Rn explained to pt that she needs a new IV pt refusing at this time to let any staff help her, continuously punching at staff when staff enters room. No Prn medication ordered at this time, Dr. Sherral Hammers notified.

## 2020-09-19 NOTE — Progress Notes (Signed)
Pt refusing meds and drink at this time. Pt isn't verbally responding to this nurse.

## 2020-09-19 NOTE — ED Notes (Signed)
Called lab, they will add all morning labs. Pt already has UDA and resulted from last night.

## 2020-09-19 NOTE — Plan of Care (Signed)
  Problem: Urinary Elimination: Goal: Signs and symptoms of infection will decrease Outcome: Progressing   Problem: Education: Goal: Knowledge of General Education information will improve Description: Including pain rating scale, medication(s)/side effects and non-pharmacologic comfort measures Outcome: Progressing   Problem: Health Behavior/Discharge Planning: Goal: Ability to manage health-related needs will improve Outcome: Progressing   Problem: Clinical Measurements: Goal: Ability to maintain clinical measurements within normal limits will improve Outcome: Progressing Goal: Will remain free from infection Outcome: Progressing Goal: Diagnostic test results will improve Outcome: Progressing Goal: Respiratory complications will improve Outcome: Progressing Goal: Cardiovascular complication will be avoided Outcome: Progressing   Problem: Activity: Goal: Risk for activity intolerance will decrease Outcome: Progressing   Problem: Nutrition: Goal: Adequate nutrition will be maintained Outcome: Progressing   Problem: Elimination: Goal: Will not experience complications related to bowel motility Outcome: Progressing Goal: Will not experience complications related to urinary retention Outcome: Progressing   Problem: Pain Managment: Goal: General experience of comfort will improve Outcome: Progressing   Problem: Skin Integrity: Goal: Risk for impaired skin integrity will decrease Outcome: Progressing   Problem: Coping: Goal: Level of anxiety will decrease Outcome: Not Progressing   Problem: Safety: Goal: Ability to remain free from injury will improve Outcome: Not Progressing   Problem: Safety: Goal: Non-violent Restraint(s) Outcome: Not Progressing   Problem: Coping: Goal: Level of anxiety will decrease Outcome: Not Progressing   Problem: Safety: Goal: Ability to remain free from injury will improve Outcome: Not Progressing   Problem: Safety: Goal:  Non-violent Restraint(s) Outcome: Not Progressing

## 2020-09-19 NOTE — Progress Notes (Signed)
   09/19/20 1339  Restraint Order  Length of Order Daily  Assessment  Less Restrictive Interventions Attempted Yes  Health history reviewed prior to applying restraint Yes  Justification  Clinical Justification Pulling lines;Removal of equipment  Plan to Progress Out Continue safety plan  Education  Discontinuation Criteria Follows instructions;No longer interfering with care  Discontinuation Criteria Explained Yes  Family Notification Patient declined notification  Restraint Every 2 Hour Monitoring  Airway Clear with Spontaneous Respirations Yes  Circulation / Skin Integrity No signs of injury  Emotional / Mental Status Agitated/restless;Verbally abusive  Range of Motion Performed  Food and Fluids Fluid/Food offered  Elimination External urinary catheter  Patient's rights, dignity, safety maintained Yes  Can Restraints be Less Restrictive or Discontinued? No  Non-violent Restraints  Soft Restraint Right Wrist Start  Soft Restraint Left Wrist Start  Soft Restraint Right Ankle Start  Soft Restraint Left Ankle Start  Safety Interventions  Less Restrictive Interventions Active listening;Reorient patient;Frequent verbal contacts;Diversional activities;Bed alarm  Diversional Activities Music/relaxation tapes, videos, CDs

## 2020-09-19 NOTE — Progress Notes (Signed)
PROGRESS NOTE    [name]  KGU:[id number] DOB: [birth year] DOA: 09/18/2020 PCP: [facility]     Brief Narrative:  Karen Best is a 65 y.o. BF PMHx bipolar 1 DO, schizophrenia, Hx of substance abuse, f Hx stroke in June 2021,   Brought to the ER after patient was found to be confused.  Does not know exactly when patient got confused and who found the patient and how patient was brought to the ER.  Patient has still does not remember who called EMS.  ED Course: In the ER patient is not agitated but appears confused but oriented to her name and place.  Patient is afebrile and UA is concerning for UTI.  No family contacts available in the chart.  At this time patient was started on ceftriaxone and admitted for further observation for acute encephalopathy secondary UTI.  CT head is pending.  COVID test is negative.   Subjective: Patient belligerent attacking the staff whenever they enter the room stating they are trying to kill her.  Talking to her self.  States she has not been taking her "fucking medication" and she does not have to.   Assessment & Plan: Covid vaccination; no vaccination does not want vaccination   Principal Problem:   Acute encephalopathy Active Problems:   Acute UTI (urinary tract infection)   Schizoaffective disorder, bipolar type ([place])   Paranoid schizophrenia - 5/25 patient has become belligerent throwing trees at staff, kicking and punching staff.  States they are trying to kill her. -5/25 Place patient in restraints -5/25 Haldol 1 -2 mg PRN - 5/25 per psychiatry resume olanzapine and Depakote, if this does not work overnight will reconsult psychiatry in the a.m. as patient was very combative  Acute encephalopathy - Resolved A/O x4 although cursing at staff pulling out lines, throwing objects at staff.  Hx stroke - 5/25 negative for acute stroke.  UTI - Complete 3 to 5-day course antibiotics - Culture pending      DVT prophylaxis: Lovenox  Code Status: Full Family Communication:  Status is: Inpatient    Dispo: The patient is from: ???              Anticipated d/c is to:????              Anticipated d/c date is: 5/28              Patient currently unstable      Consultants:  Psychiatry  Procedures/Significant Events:  5/25 CT head W0 contrast:No acute intracranial abnormality    I have personally reviewed and interpreted all radiology studies and my findings are as above.  VENTILATOR SETTINGS:    Cultures   Antimicrobials:    Devices    LINES / TUBES:      Continuous Infusions: . sodium chloride 100 mL/hr at 09/19/20 0020  . cefTRIAXone (ROCEPHIN)  IV       Objective: Vitals:   09/19/20 0400 09/19/20 0430 09/19/20 0600 09/19/20 0630  BP: 124/70 134/72 140/76 132/73  Pulse: 65 74 64   Resp:  19 17 17   Temp:      TempSrc:      SpO2: 100% 100% 100% 98%   No intake or output data in the 24 hours ending 09/19/20 0748 There were no vitals filed for this visit.  Examination:  General: A/O x4, No acute respiratory distress, extremely belligerent and combative Eyes: negative scleral hemorrhage, negative anisocoria, negative icterus ENT: Negative Runny  nose, negative gingival bleeding, Neck:  Negative scars, masses, torticollis, lymphadenopathy, JVD Lungs: Clear to auscultation bilaterally without wheezes or crackles Cardiovascular: Regular rate and rhythm without murmur gallop or rub normal S1 and S2 Abdomen: negative abdominal pain, nondistended, positive soft, bowel sounds, no rebound, no ascites, no appreciable mass Extremities: No significant cyanosis, clubbing, or edema bilateral lower extremities Skin: Negative rashes, lesions, ulcers Psychiatric:  Negative depression, negative anxiety, negative fatigue, negative mania  Central nervous system:  Cranial nerves II through XII intact, tongue/uvula midline, all extremities muscle strength 5/5, sensation  intact throughout, negative dysarthria, negative expressive aphasia, negative receptive aphasia.  .     Data Reviewed: Care during the described time interval was provided by me .  I have reviewed this patient's available data, including medical history, events of note, physical examination, and all test results as part of my evaluation.  CBC: Recent Labs  Lab 09/18/20 1734 09/19/20 0248  WBC 9.6 7.6  NEUTROABS 4.8  --   HGB 14.6 12.6  HCT 48.4* 41.5  MCV 76.1* 75.7*  PLT 331 841   Basic Metabolic Panel: Recent Labs  Lab 09/18/20 1734 09/19/20 0248  NA 139 139  K 4.6 3.9  CL 108 109  CO2 24 23  GLUCOSE 104* 113*  BUN 10 11  CREATININE 0.92 0.76  CALCIUM 9.9 8.8*   GFR: CrCl cannot be calculated (Unknown ideal weight.). Liver Function Tests: Recent Labs  Lab 09/18/20 1734 09/19/20 0248  AST 33 31  ALT 32 28  ALKPHOS 81 62  BILITOT 0.6 0.5  PROT 8.0 6.4*  ALBUMIN 3.8 2.9*   No results for input(s): LIPASE, AMYLASE in the last 168 hours. Recent Labs  Lab 09/18/20 2338  AMMONIA 21   Coagulation Profile: No results for input(s): INR, PROTIME in the last 168 hours. Cardiac Enzymes: No results for input(s): CKTOTAL, CKMB, CKMBINDEX, TROPONINI in the last 168 hours. BNP (last 3 results) No results for input(s): PROBNP in the last 8760 hours. HbA1C: No results for input(s): HGBA1C in the last 72 hours. CBG: No results for input(s): GLUCAP in the last 168 hours. Lipid Profile: No results for input(s): CHOL, HDL, LDLCALC, TRIG, CHOLHDL, LDLDIRECT in the last 72 hours. Thyroid Function Tests: No results for input(s): TSH, T4TOTAL, FREET4, T3FREE, THYROIDAB in the last 72 hours. Anemia Panel: No results for input(s): VITAMINB12, FOLATE, FERRITIN, TIBC, IRON, RETICCTPCT in the last 72 hours. Sepsis Labs: No results for input(s): PROCALCITON, LATICACIDVEN in the last 168 hours.  Recent Results (from the past 240 hour(s))  Resp Panel by RT-PCR (Flu A&B,  Covid) Nasopharyngeal Swab     Status: None   Collection Time: 09/18/20 11:44 PM   Specimen: Nasopharyngeal Swab; Nasopharyngeal(NP) swabs in vial transport medium  Result Value Ref Range Status   SARS Coronavirus 2 by RT PCR NEGATIVE NEGATIVE Final    Comment: (NOTE) SARS-CoV-2 target nucleic acids are NOT DETECTED.  The SARS-CoV-2 RNA is generally detectable in upper respiratory specimens during the acute phase of infection. The lowest concentration of SARS-CoV-2 viral copies this assay can detect is 138 copies/mL. A negative result does not preclude SARS-Cov-2 infection and should not be used as the sole basis for treatment or other patient management decisions. A negative result may occur with  improper specimen collection/handling, submission of specimen other than nasopharyngeal swab, presence of viral mutation(s) within the areas targeted by this assay, and inadequate number of viral copies(<138 copies/mL). A negative result must be combined with clinical observations, patient  history, and epidemiological information. The expected result is Negative.  Fact Sheet for Patients:  EntrepreneurPulse.com.au  Fact Sheet for Healthcare Providers:  IncredibleEmployment.be  This test is no t yet approved or cleared by the [place] FDA and  has been authorized for detection and/or diagnosis of SARS-CoV-2 by FDA under an Emergency Use Authorization (EUA). This EUA will remain  in effect (meaning this test can be used) for the duration of the COVID-19 declaration under Section 564(b)(1) of the Act, 21 U.S.C.section 360bbb-3(b)(1), unless the authorization is terminated  or revoked sooner.       Influenza A by PCR NEGATIVE NEGATIVE Final   Influenza B by PCR NEGATIVE NEGATIVE Final    Comment: (NOTE) The Xpert Xpress SARS-CoV-2/FLU/RSV plus assay is intended as an aid in the diagnosis of influenza from Nasopharyngeal swab specimens and should  not be used as a sole basis for treatment. Nasal washings and aspirates are unacceptable for Xpert Xpress SARS-CoV-2/FLU/RSV testing.  Fact Sheet for Patients: EntrepreneurPulse.com.au  Fact Sheet for Healthcare Providers: IncredibleEmployment.be  This test is not yet approved or cleared by the [place] FDA and has been authorized for detection and/or diagnosis of SARS-CoV-2 by FDA under an Emergency Use Authorization (EUA). This EUA will remain in effect (meaning this test can be used) for the duration of the COVID-19 declaration under Section 564(b)(1) of the Act, 21 U.S.C. section 360bbb-3(b)(1), unless the authorization is terminated or revoked.  Performed at [facility], [place] [address] [place], [place] [address]          Radiology Studies: CT HEAD WO CONTRAST  Result Date: 09/19/2020 CLINICAL DATA:  Mental status change. EXAM: CT HEAD WITHOUT CONTRAST TECHNIQUE: Contiguous axial images were obtained from the base of the skull through the vertex without intravenous contrast. COMPARISON:  CT head 05/12/2020 FINDINGS: Brain: Patchy and confluent areas of decreased attenuation are noted throughout the deep and periventricular white matter of the cerebral hemispheres bilaterally, compatible with chronic microvascular ischemic disease. No evidence of large-territorial acute infarction. No parenchymal hemorrhage. No mass lesion. No extra-axial collection. No mass effect or midline shift. No hydrocephalus. Basilar cisterns are patent. Vascular: No hyperdense vessel. Skull: No acute fracture or focal lesion. Sinuses/Orbits: Paranasal sinuses and mastoid air cells are clear. The orbits are unremarkable. Other: None. IMPRESSION: No acute intracranial abnormality. Electronically Signed   By: Iven Finn M.D.   On: 09/19/2020 00:45        Scheduled Meds: Continuous Infusions: . sodium chloride 100 mL/hr at 09/19/20 0020  .  cefTRIAXone (ROCEPHIN)  IV       LOS: 0 days    Time spent:40 min    Wannetta Langland, Geraldo Docker, MD [facility]   If 7PM-7AM, please contact night-coverage 09/19/2020, 7:48 AM

## 2020-09-20 ENCOUNTER — Other Ambulatory Visit (HOSPITAL_COMMUNITY): Payer: Self-pay

## 2020-09-20 DIAGNOSIS — F200 Paranoid schizophrenia: Secondary | ICD-10-CM | POA: Diagnosis not present

## 2020-09-20 DIAGNOSIS — G9340 Encephalopathy, unspecified: Secondary | ICD-10-CM | POA: Diagnosis not present

## 2020-09-20 DIAGNOSIS — R410 Disorientation, unspecified: Secondary | ICD-10-CM | POA: Diagnosis not present

## 2020-09-20 DIAGNOSIS — N390 Urinary tract infection, site not specified: Secondary | ICD-10-CM | POA: Diagnosis not present

## 2020-09-20 LAB — CBC WITH DIFFERENTIAL/PLATELET
Abs Immature Granulocytes: 0.02 10*3/uL (ref 0.00–0.07)
Basophils Absolute: 0 10*3/uL (ref 0.0–0.1)
Basophils Relative: 0 %
Eosinophils Absolute: 0 10*3/uL (ref 0.0–0.5)
Eosinophils Relative: 1 %
HCT: 47.1 % — ABNORMAL HIGH (ref 36.0–46.0)
Hemoglobin: 14.3 g/dL (ref 12.0–15.0)
Immature Granulocytes: 0 %
Lymphocytes Relative: 31 %
Lymphs Abs: 2 10*3/uL (ref 0.7–4.0)
MCH: 22.9 pg — ABNORMAL LOW (ref 26.0–34.0)
MCHC: 30.4 g/dL (ref 30.0–36.0)
MCV: 75.4 fL — ABNORMAL LOW (ref 80.0–100.0)
Monocytes Absolute: 0.5 10*3/uL (ref 0.1–1.0)
Monocytes Relative: 8 %
Neutro Abs: 4.1 10*3/uL (ref 1.7–7.7)
Neutrophils Relative %: 60 %
Platelets: 333 10*3/uL (ref 150–400)
RBC: 6.25 MIL/uL — ABNORMAL HIGH (ref 3.87–5.11)
RDW: 15.8 % — ABNORMAL HIGH (ref 11.5–15.5)
WBC: 6.7 10*3/uL (ref 4.0–10.5)
nRBC: 0 % (ref 0.0–0.2)

## 2020-09-20 LAB — COMPREHENSIVE METABOLIC PANEL
ALT: 34 U/L (ref 0–44)
AST: 39 U/L (ref 15–41)
Albumin: 3.8 g/dL (ref 3.5–5.0)
Alkaline Phosphatase: 80 U/L (ref 38–126)
Anion gap: 10 (ref 5–15)
BUN: 9 mg/dL (ref 8–23)
CO2: 21 mmol/L — ABNORMAL LOW (ref 22–32)
Calcium: 10.7 mg/dL — ABNORMAL HIGH (ref 8.9–10.3)
Chloride: 108 mmol/L (ref 98–111)
Creatinine, Ser: 0.73 mg/dL (ref 0.44–1.00)
GFR, Estimated: >60 mL/min (ref >60)
Glucose, Bld: 146 mg/dL — ABNORMAL HIGH (ref 70–99)
Potassium: 4.4 mmol/L (ref 3.5–5.1)
Sodium: 139 mmol/L (ref 135–145)
Total Bilirubin: 0.5 mg/dL (ref 0.3–1.2)
Total Protein: 8.4 g/dL — ABNORMAL HIGH (ref 6.5–8.1)

## 2020-09-20 LAB — MAGNESIUM: Magnesium: 2 mg/dL (ref 1.7–2.4)

## 2020-09-20 LAB — PHOSPHORUS: Phosphorus: 3.7 mg/dL (ref 2.5–4.6)

## 2020-09-20 MED ORDER — OLANZAPINE 5 MG PO TBDP
10.0000 mg | ORAL_TABLET | Freq: Every day | ORAL | Status: DC
  Administered 2020-09-20 – 2020-09-25 (×6): 10 mg via ORAL
  Filled 2020-09-20 (×7): qty 2

## 2020-09-20 MED ORDER — OLANZAPINE 5 MG PO TBDP
2.5000 mg | ORAL_TABLET | Freq: Every morning | ORAL | Status: DC
  Administered 2020-09-20 – 2020-09-21 (×2): 2.5 mg via ORAL
  Filled 2020-09-20 (×2): qty 0.5

## 2020-09-20 MED ORDER — METOPROLOL TARTRATE 5 MG/5ML IV SOLN
5.0000 mg | Freq: Three times a day (TID) | INTRAVENOUS | Status: DC
  Administered 2020-09-20 – 2020-09-23 (×4): 5 mg via INTRAVENOUS
  Filled 2020-09-20 (×5): qty 5

## 2020-09-20 MED ORDER — LISINOPRIL 5 MG PO TABS
5.0000 mg | ORAL_TABLET | Freq: Every day | ORAL | Status: DC
  Administered 2020-09-20 – 2020-09-26 (×7): 5 mg via ORAL
  Filled 2020-09-20 (×7): qty 1

## 2020-09-20 NOTE — Progress Notes (Signed)
PIV consult: R forearm site assessed, brisk blood return noted. Extension set and dressing changed to decrease interference with wrist restraint. Message to RN to change primary and secondary tubing.

## 2020-09-20 NOTE — Progress Notes (Signed)
At this time RN removed pt's bilateral ankle restraints, will continue to monitor

## 2020-09-20 NOTE — Progress Notes (Signed)
   09/20/20 1400  Assess: MEWS Score  Temp 98.5 F (36.9 C)  BP (!) 179/102  Pulse Rate (!) 120  ECG Heart Rate (!) 119  Resp 20  SpO2 100 %  Assess: MEWS Score  MEWS Temp 0  MEWS Systolic 0  MEWS Pulse 2  MEWS RR 0  MEWS LOC 0  MEWS Score 2  MEWS Score Color Yellow  Assess: if the MEWS score is Yellow or Red  Were vital signs taken at a resting state? Yes  Focused Assessment No change from prior assessment  Early Detection of Sepsis Score *See Row Information* Low  MEWS guidelines implemented *See Row Information* No, vital signs rechecked   Dr. Sherral Hammers notified pts HR elevated in 120's , EKG taken and placed in pts chart, awaiting new orders.

## 2020-09-20 NOTE — Progress Notes (Signed)
   09/20/20 1732  Vitals  BP (!) 172/102  MAP (mmHg) 122   Dr. Sherral Hammers notified pts BP awaiting new orders.

## 2020-09-20 NOTE — TOC Initial Note (Signed)
Transition of Care [facility]) - Initial/Assessment Note    Patient Details  Name: Karen Best MRN: [id number] Date of Birth: 08-21-1955  Transition of [facility]) CM/SW Contact:    Benard Halsted, LCSW Phone Number: 09/20/2020, 9:18 AM  Clinical Narrative:                 Per psychiatry, patient would benefit from outpatient psych for medication management. Info placed on AVS for patient. TOC to continue to follow for needs.     Barriers to Discharge: Continued Medical Work up   Patient Goals and CMS Choice        Expected Discharge Plan and Services   In-house Referral: Clinical Social Work                                            Prior Living Arrangements/Services     Patient language and need for interpreter reviewed:: Yes        Need for Family Participation in Patient Care: Yes (Comment) Care giver support system in place?: No (comment)   Criminal Activity/Legal Involvement Pertinent to Current Situation/Hospitalization: No - Comment as needed  Activities of Daily Living Home Assistive Devices/Equipment: None ADL Screening (condition at time of admission) Patient's cognitive ability adequate to safely complete daily activities?: No Is the patient deaf or have difficulty hearing?: No Does the patient have difficulty seeing, even when wearing glasses/contacts?: No Does the patient have difficulty concentrating, remembering, or making decisions?: Yes Patient able to express need for assistance with ADLs?: No Does the patient have difficulty dressing or bathing?: No Independently performs ADLs?: Yes (appropriate for developmental age) Does the patient have difficulty walking or climbing stairs?: No Weakness of Legs: None Weakness of Arms/Hands: None  Permission Sought/Granted                  Emotional Assessment Appearance:: Appears stated age Attitude/Demeanor/Rapport: Unable to Assess Affect (typically observed): Unable to  Assess Orientation: : Oriented to Self,Oriented to Place Alcohol / Substance Use: Not Applicable Psych Involvement: Yes (comment) (Outpatient psych recommended for med follow up)  Admission diagnosis:  Disorientation [R41.0] Acute encephalopathy [G93.40] Acute UTI (urinary tract infection) [N39.0] Patient Active Problem List   Diagnosis Date Noted  . Acute encephalopathy 09/18/2020  . Schizoaffective disorder, bipolar type ([place]) 05/13/2020  . Bacteremia due to Staphylococcus aureus 10/03/2019  . Dehydration with hypernatremia 10/03/2019  . AKI (acute kidney injury) ([place]) 10/02/2019  . Transaminitis 10/02/2019  . CVA (cerebral vascular accident) ([place]) 10/02/2019  . AMS (altered mental status) 10/01/2019  . Lithium toxicity 02/16/2019  . Hyperkalemia 02/16/2019  . Acute confusion 12/29/2018  . Toxic metabolic encephalopathy 99/83/3825  . Dysarthria 12/23/2018  . Elevated blood pressure reading 12/23/2018  . Asthma 12/23/2018  . Delirium due to another medical condition   . Acute UTI (urinary tract infection) 12/06/2018  . Acute metabolic encephalopathy 05/39/7673  . Positive hepatitis C antibody test 06/11/2018   PCP:  [facility] Pharmacy:   [employer], [place] [address] Phone: [phone] Fax: [phone]  [facility] [address] [place] [address] Phone: [phone] Fax: [phone]     Social Determinants of Health (SDOH) Interventions    Readmission Risk Interventions No flowsheet  data found.

## 2020-09-20 NOTE — Progress Notes (Signed)
Pt.  Admitted form ED to 5W 30, Pt oriented to self, place and situation unclear on month, year and president at this time. Skin clean dry and intact, VS stable, bed placed in lowest position and call bell within reach.   09/19/20 0943  Vitals  Temp 97.9 F (36.6 C)  Temp Source Oral  BP (!) 165/77  MAP (mmHg) 101  BP Location Left Arm  BP Method Automatic  Patient Position (if appropriate) Lying  Pulse Rate 67  Pulse Rate Source Monitor  ECG Heart Rate 67  Resp 16  Level of Consciousness  Level of Consciousness Alert  MEWS COLOR  MEWS Score Color Green  MEWS Score  MEWS Temp 0  MEWS Systolic 0  MEWS Pulse 0  MEWS RR 0  MEWS LOC 0  MEWS Score 0

## 2020-09-20 NOTE — Consult Note (Signed)
Karen Best a 65 y.o.femalepast medical history of schizophrenia, schizoaffective disorder bipolar type, CVA, presenting to the ED for evaluation of feeling unwell. It is reported that patient arrived via [employer], however it is reported that [employer] left the patient at triage and nursing was unable to obtain any story from [employer] prior to their departure. It is unclear why they were called and there is no history of prior to patient's arrival today. She states however she does not feel well. She endorses body aches. Upon further discussion, patient is unable to provide adequate history. She is tangential with disorganized speech though pleasantand in no distress. Level 5 caveat secondary to status change.  Patient re seen and assessed, chart review performed. Patient seen and case discussed.  Patient is alert and oriented x 3.  She remains appropriate throughout the assessment, and displays no disruptive behaviors. She does not appear to be exhibiting any psychosis, delusional thought disorder, and or paranoia. She requests assistance with her lunch, and shows appropriate thought process as she requests to have her restraints removed in order to eat. Discussed with patient her behaviors yesterday that resulted in her being restrained, and she is very apologetic and remorseful. " I have apologized, and I will apologize to the whole world. I don't know what happened. "   At the time of the evaluation she is being cooperative and following nursing orders.  She has been compliant with her medications thus far and is able to show appropriate recall by identifying the pill she" placed under my tongue and the other one I swallowed. "  Suspect she maybe having increased agitation due to include underlying infectious process such as urinary tract infection.  After reading the chart it appears patient become easily agitated, difficult to redirect and made multiple attempts to get out of bed after assessment took  place yesterday. She became combative and attempted to assualt staff on numerous occasions, which resulted in her receiving prn Haldol IV for agitation. Remain hopeful that her Psychiatric symptoms will continue to improve, as she appears to have appropriate cognitive thinking at this time. However if she continues to exhibit these behaviors we will need to pursue further psychiatric management.   -U/A positive for leukocytes and nitrites, she has been treated with Rocephin 1gram. UDS negative  -Initiate delirium precautions. She is at risk for developing delirium.  -Adjustments will be made to include adding olanzapine zydis 2,5mg  po qam and adjusting time of olanzapine night time dose.  -Psychiatry to reassess tomorrow, as patient appears to have waxing and waning of cognitive impairment and has become combative. She is still restrained at the time of this evaluation.

## 2020-09-20 NOTE — Progress Notes (Addendum)
Pt has remained out of bilateral ankle restraints at this time. Will discontinue ankle restraints. and remain in wrist restraints.

## 2020-09-20 NOTE — Progress Notes (Signed)
PROGRESS NOTE    DONISHA HOCH  FYB:[id number] DOB: 1955-12-03 DOA: 09/18/2020 PCP: [facility]     Brief Narrative:  FRIMY UFFELMAN is a 65 y.o. BF PMHx bipolar 1 DO, schizophrenia, Hx of substance abuse, f Hx stroke in June 2021,   Brought to the ER after patient was found to be confused.  Does not know exactly when patient got confused and who found the patient and how patient was brought to the ER.  Patient has still does not remember who called EMS.  ED Course: In the ER patient is not agitated but appears confused but oriented to her name and place.  Patient is afebrile and UA is concerning for UTI.  No family contacts available in the chart.  At this time patient was started on ceftriaxone and admitted for further observation for acute encephalopathy secondary UTI.  CT head is pending.  COVID test is negative.   Subjective: 5/26 A/Ox4 was talking to a Education officer, museum who was not ibn the room. As soon as Pt's restraints released Pt pulled out IV and all Tele leads, and became agitated.     Assessment & Plan: Covid vaccination; no vaccination does not want vaccination   Principal Problem:   Acute encephalopathy Active Problems:   Acute UTI (urinary tract infection)   Schizoaffective disorder, bipolar type ([place])   Paranoid schizophrenia - 5/25 patient has become belligerent throwing trees at staff, kicking and punching staff.  States they are trying to kill her. -5/25 Place patient in restraints -5/25 Haldol 1 -2 mg PRN - 5/25 per psychiatry resume olanzapine and Depakote, if this does not work overnight will reconsult psychiatry in the a.m. as patient was very combative  Acute encephalopathy - Resolved A/O x4 although cursing at staff pulling out lines, throwing objects at staff.  Hx stroke - 5/25 negative for acute stroke.  Sinus Tach -5/26 metoprolol IV 5 mg TID. Hold for HR<60 or SBP< 130: We will convert over to p.o. in  a.m. - 5/26 lisinopril 5 mg daily  Essential HTN -Seen sinus tachycardia  UTI  positive E. Coli - Complete 5-day course antibiotics     DVT prophylaxis: Lovenox  Code Status: Full Family Communication:  Status is: Inpatient    Dispo: The patient is from: ???              Anticipated d/c is to: Psychiatry may some adjustments to patient's medications on 5/26.  Hopefully by the a.m. will be able to discontinue restraints in order to have a shot at placement.              Anticipated d/c date is: 5/28              Patient currently unstable      Consultants:  Psychiatry  Procedures/Significant Events:  5/25 CT head W0 contrast:No acute intracranial abnormality    I have personally reviewed and interpreted all radiology studies and my findings are as above.  VENTILATOR SETTINGS:    Cultures 5/24 urine positive E. Coli   Antimicrobials: Anti-infectives (From admission, onward)   Start     Ordered Stop   09/19/20 2200  cefTRIAXone (ROCEPHIN) 1 g in sodium chloride 0.9 % 100 mL IVPB        09/18/20 2338     09/18/20 2245  cefTRIAXone (ROCEPHIN) 1 g in sodium chloride 0.9 % 100 mL IVPB        09/18/20 2238 09/18/20 2358  Devices    LINES / TUBES:      Continuous Infusions: . cefTRIAXone (ROCEPHIN)  IV 1 g (09/20/20 0142)     Objective: Vitals:   09/19/20 2025 09/19/20 2300 09/20/20 0404 09/20/20 1400  BP: (!) 176/100  (!) 186/91 (!) 179/102  Pulse: (!) 104  (!) 101 (!) 120  Resp: 18  18 20   Temp: 98.4 F (36.9 C)  98.6 F (37 C) 98.5 F (36.9 C)  TempSrc: Oral  Oral Oral  SpO2: 95%  99% 100%  Weight:  67.5 kg    Height:  5\' 5"  (1.651 m)      Intake/Output Summary (Last 24 hours) at 09/20/2020 1425 Last data filed at 09/20/2020 1317 Gross per 24 hour  Intake 1157.72 ml  Output 1150 ml  Net 7.72 ml   Filed Weights   09/19/20 2300  Weight: 67.5 kg    Examination:  General: A/O x4, No acute respiratory distress, initially  called but symptoms restraints were removed became extremely belligerent and combative Eyes: negative scleral hemorrhage, negative anisocoria, negative icterus ENT: Negative Runny nose, negative gingival bleeding, Neck:  Negative scars, masses, torticollis, lymphadenopathy, JVD Lungs: Clear to auscultation bilaterally without wheezes or crackles Cardiovascular: Regular rate and rhythm without murmur gallop or rub normal S1 and S2 Abdomen: negative abdominal pain, nondistended, positive soft, bowel sounds, no rebound, no ascites, no appreciable mass Extremities: No significant cyanosis, clubbing, or edema bilateral lower extremities Skin: Negative rashes, lesions, ulcers Psychiatric:  Negative depression, negative anxiety, negative fatigue, negative mania  Central nervous system:  Cranial nerves II through XII intact, tongue/uvula midline, all extremities muscle strength 5/5, sensation intact throughout, negative dysarthria, negative expressive aphasia, negative receptive aphasia.  .     Data Reviewed: Care during the described time interval was provided by me .  I have reviewed this patient's available data, including medical history, events of note, physical examination, and all test results as part of my evaluation.  CBC: Recent Labs  Lab 09/18/20 1734 09/19/20 0248 09/20/20 0158  WBC 9.6 7.6 6.7  NEUTROABS 4.8  --  4.1  HGB 14.6 12.6 14.3  HCT 48.4* 41.5 47.1*  MCV 76.1* 75.7* 75.4*  PLT 331 278 130   Basic Metabolic Panel: Recent Labs  Lab 09/18/20 1734 09/19/20 0248 09/20/20 0158  NA 139 139 139  K 4.6 3.9 4.4  CL 108 109 108  CO2 24 23 21*  GLUCOSE 104* 113* 146*  BUN 10 11 9   CREATININE 0.92 0.76 0.73  CALCIUM 9.9 8.8* 10.7*  MG  --   --  2.0  PHOS  --   --  3.7   GFR: Estimated Creatinine Clearance: 64.8 mL/min (by C-G formula based on SCr of 0.73 mg/dL). Liver Function Tests: Recent Labs  Lab 09/18/20 1734 09/19/20 0248 09/20/20 0158  AST 33 31 39   ALT 32 28 34  ALKPHOS 81 62 80  BILITOT 0.6 0.5 0.5  PROT 8.0 6.4* 8.4*  ALBUMIN 3.8 2.9* 3.8   No results for input(s): LIPASE, AMYLASE in the last 168 hours. Recent Labs  Lab 09/18/20 2338  AMMONIA 21   Coagulation Profile: No results for input(s): INR, PROTIME in the last 168 hours. Cardiac Enzymes: No results for input(s): CKTOTAL, CKMB, CKMBINDEX, TROPONINI in the last 168 hours. BNP (last 3 results) No results for input(s): PROBNP in the last 8760 hours. HbA1C: No results for input(s): HGBA1C in the last 72 hours. CBG: No results for input(s): GLUCAP in the last  168 hours. Lipid Profile: No results for input(s): CHOL, HDL, LDLCALC, TRIG, CHOLHDL, LDLDIRECT in the last 72 hours. Thyroid Function Tests: No results for input(s): TSH, T4TOTAL, FREET4, T3FREE, THYROIDAB in the last 72 hours. Anemia Panel: Recent Labs    09/19/20 0248 09/19/20 0753  VITAMINB12  --  433  FOLATE  --  9.7  FERRITIN  --  25  TIBC  --  423  IRON  --  80  RETICCTPCT 1.2  --    Sepsis Labs: No results for input(s): PROCALCITON, LATICACIDVEN in the last 168 hours.  Recent Results (from the past 240 hour(s))  Urine culture     Status: Abnormal (Preliminary result)   Collection Time: 09/18/20 10:42 PM   Specimen: Urine, Random  Result Value Ref Range Status   Specimen Description URINE, RANDOM  Final   Special Requests NONE  Final   Culture (A)  Final    >=100,000 COLONIES/mL ESCHERICHIA COLI SUSCEPTIBILITIES TO FOLLOW Performed at [facility], [address], [place], [place] [address]    Report Status PENDING  Incomplete  Resp Panel by RT-PCR (Flu A&B, Covid) Nasopharyngeal Swab     Status: None   Collection Time: 09/18/20 11:44 PM   Specimen: Nasopharyngeal Swab; Nasopharyngeal(NP) swabs in vial transport medium  Result Value Ref Range Status   SARS Coronavirus 2 by RT PCR NEGATIVE NEGATIVE Final    Comment: (NOTE) SARS-CoV-2 target nucleic acids are NOT DETECTED.  The  SARS-CoV-2 RNA is generally detectable in upper respiratory specimens during the acute phase of infection. The lowest concentration of SARS-CoV-2 viral copies this assay can detect is 138 copies/mL. A negative result does not preclude SARS-Cov-2 infection and should not be used as the sole basis for treatment or other patient management decisions. A negative result may occur with  improper specimen collection/handling, submission of specimen other than nasopharyngeal swab, presence of viral mutation(s) within the areas targeted by this assay, and inadequate number of viral copies(<138 copies/mL). A negative result must be combined with clinical observations, patient history, and epidemiological information. The expected result is Negative.  Fact Sheet for Patients:  EntrepreneurPulse.com.au  Fact Sheet for Healthcare Providers:  IncredibleEmployment.be  This test is no t yet approved or cleared by the [place] FDA and  has been authorized for detection and/or diagnosis of SARS-CoV-2 by FDA under an Emergency Use Authorization (EUA). This EUA will remain  in effect (meaning this test can be used) for the duration of the COVID-19 declaration under Section 564(b)(1) of the Act, 21 U.S.C.section 360bbb-3(b)(1), unless the authorization is terminated  or revoked sooner.       Influenza A by PCR NEGATIVE NEGATIVE Final   Influenza B by PCR NEGATIVE NEGATIVE Final    Comment: (NOTE) The Xpert Xpress SARS-CoV-2/FLU/RSV plus assay is intended as an aid in the diagnosis of influenza from Nasopharyngeal swab specimens and should not be used as a sole basis for treatment. Nasal washings and aspirates are unacceptable for Xpert Xpress SARS-CoV-2/FLU/RSV testing.  Fact Sheet for Patients: EntrepreneurPulse.com.au  Fact Sheet for Healthcare Providers: IncredibleEmployment.be  This test is not yet approved or  cleared by the [place] FDA and has been authorized for detection and/or diagnosis of SARS-CoV-2 by FDA under an Emergency Use Authorization (EUA). This EUA will remain in effect (meaning this test can be used) for the duration of the COVID-19 declaration under Section 564(b)(1) of the Act, 21 U.S.C. section 360bbb-3(b)(1), unless the authorization is terminated or revoked.  Performed  at [facility], [place] [address] [place], [place] [address]          Radiology Studies: CT HEAD WO CONTRAST  Result Date: 09/19/2020 CLINICAL DATA:  Mental status change. EXAM: CT HEAD WITHOUT CONTRAST TECHNIQUE: Contiguous axial images were obtained from the base of the skull through the vertex without intravenous contrast. COMPARISON:  CT head 05/12/2020 FINDINGS: Brain: Patchy and confluent areas of decreased attenuation are noted throughout the deep and periventricular white matter of the cerebral hemispheres bilaterally, compatible with chronic microvascular ischemic disease. No evidence of large-territorial acute infarction. No parenchymal hemorrhage. No mass lesion. No extra-axial collection. No mass effect or midline shift. No hydrocephalus. Basilar cisterns are patent. Vascular: No hyperdense vessel. Skull: No acute fracture or focal lesion. Sinuses/Orbits: Paranasal sinuses and mastoid air cells are clear. The orbits are unremarkable. Other: None. IMPRESSION: No acute intracranial abnormality. Electronically Signed   By: Iven Finn M.D.   On: 09/19/2020 00:45        Scheduled Meds: . divalproex  500 mg Oral Q12H  . enoxaparin (LOVENOX) injection  40 mg Subcutaneous QHS  . OLANZapine zydis  10 mg Oral QHS  . OLANZapine zydis  2.5 mg Oral q morning  . valbenazine  40 mg Oral QHS   Continuous Infusions: . cefTRIAXone (ROCEPHIN)  IV 1 g (09/20/20 0142)     LOS: 1 day    Time spent:40 min    Datha Kissinger, Geraldo Docker, MD [facility]   If 7PM-7AM, please contact  night-coverage 09/20/2020, 2:25 PM

## 2020-09-21 DIAGNOSIS — N390 Urinary tract infection, site not specified: Secondary | ICD-10-CM | POA: Diagnosis not present

## 2020-09-21 DIAGNOSIS — G9340 Encephalopathy, unspecified: Secondary | ICD-10-CM | POA: Diagnosis not present

## 2020-09-21 DIAGNOSIS — F200 Paranoid schizophrenia: Secondary | ICD-10-CM | POA: Diagnosis not present

## 2020-09-21 DIAGNOSIS — R410 Disorientation, unspecified: Secondary | ICD-10-CM | POA: Diagnosis not present

## 2020-09-21 LAB — CBC WITH DIFFERENTIAL/PLATELET
Abs Immature Granulocytes: 0.04 10*3/uL (ref 0.00–0.07)
Basophils Absolute: 0 10*3/uL (ref 0.0–0.1)
Basophils Relative: 0 %
Eosinophils Absolute: 0.3 10*3/uL (ref 0.0–0.5)
Eosinophils Relative: 3 %
HCT: 46.2 % — ABNORMAL HIGH (ref 36.0–46.0)
Hemoglobin: 14.1 g/dL (ref 12.0–15.0)
Immature Granulocytes: 1 %
Lymphocytes Relative: 35 %
Lymphs Abs: 2.7 10*3/uL (ref 0.7–4.0)
MCH: 23 pg — ABNORMAL LOW (ref 26.0–34.0)
MCHC: 30.5 g/dL (ref 30.0–36.0)
MCV: 75.4 fL — ABNORMAL LOW (ref 80.0–100.0)
Monocytes Absolute: 0.8 10*3/uL (ref 0.1–1.0)
Monocytes Relative: 10 %
Neutro Abs: 4 10*3/uL (ref 1.7–7.7)
Neutrophils Relative %: 51 %
Platelets: 316 10*3/uL (ref 150–400)
RBC: 6.13 MIL/uL — ABNORMAL HIGH (ref 3.87–5.11)
RDW: 15.7 % — ABNORMAL HIGH (ref 11.5–15.5)
WBC: 7.8 10*3/uL (ref 4.0–10.5)
nRBC: 0 % (ref 0.0–0.2)

## 2020-09-21 LAB — COMPREHENSIVE METABOLIC PANEL
ALT: 33 U/L (ref 0–44)
AST: 31 U/L (ref 15–41)
Albumin: 3.5 g/dL (ref 3.5–5.0)
Alkaline Phosphatase: 74 U/L (ref 38–126)
Anion gap: 10 (ref 5–15)
BUN: 12 mg/dL (ref 8–23)
CO2: 22 mmol/L (ref 22–32)
Calcium: 10.1 mg/dL (ref 8.9–10.3)
Chloride: 106 mmol/L (ref 98–111)
Creatinine, Ser: 0.93 mg/dL (ref 0.44–1.00)
GFR, Estimated: >60 mL/min (ref >60)
Glucose, Bld: 116 mg/dL — ABNORMAL HIGH (ref 70–99)
Potassium: 4.1 mmol/L (ref 3.5–5.1)
Sodium: 138 mmol/L (ref 135–145)
Total Bilirubin: 0.5 mg/dL (ref 0.3–1.2)
Total Protein: 7.7 g/dL (ref 6.5–8.1)

## 2020-09-21 LAB — MAGNESIUM: Magnesium: 2 mg/dL (ref 1.7–2.4)

## 2020-09-21 LAB — PHOSPHORUS: Phosphorus: 3.9 mg/dL (ref 2.5–4.6)

## 2020-09-21 LAB — URINE CULTURE: Culture: >=100000 — AB

## 2020-09-21 NOTE — TOC Progression Note (Signed)
Transition of Care [facility]) - Progression Note    Patient Details  Name: JEANANN BALINSKI MRN: [id number] Date of Birth: Jun 24, 1955  [facility] CM/SW [place], RN Phone Number: 09/21/2020, 3:47 PM  Clinical Narrative:     Discussed with Dr. Sherral Hammers regarding SNF vs psychiatric admission. Last psych note this AM. Changing some medications around. Still in restraints, need them off 24 hours and no sitter for 24 hours to be considered for SNF. Would be opportunistic to look at geropsych units.     Barriers to Discharge: Continued Medical Work up  Expected Discharge Plan and Services   In-house Referral: Clinical Social Work                                             Social Determinants of Health (SDOH) Interventions    Readmission Risk Interventions No flowsheet data found.

## 2020-09-21 NOTE — Progress Notes (Signed)
PROGRESS NOTE    Karen Best  UMP:[id number] DOB: 03-01-1956 DOA: 09/18/2020 PCP: [facility]     Brief Narrative:  Karen Best is a 65 y.o. BF PMHx bipolar 1 DO, schizophrenia, Hx of substance abuse, f Hx stroke in June 2021,   Brought to the ER after patient was found to be confused.  Does not know exactly when patient got confused and who found the patient and how patient was brought to the ER.  Patient has still does not remember who called EMS.  ED Course: In the ER patient is not agitated but appears confused but oriented to her name and place.  Patient is afebrile and UA is concerning for UTI.  No family contacts available in the chart.  At this time patient was started on ceftriaxone and admitted for further observation for acute encephalopathy secondary UTI.  CT head is pending.  COVID test is negative.   Subjective: 5/27 Sleepy but arousable, A/O x4.  Does not appear to be hallucinating today.  Follows all commands    Assessment & Plan: Covid vaccination; no vaccination does not want vaccination   Principal Problem:   Acute encephalopathy Active Problems:   Acute UTI (urinary tract infection)   Schizoaffective disorder, bipolar type ([place])   Paranoid schizophrenia - 5/25 patient has become belligerent throwing trees at staff, kicking and punching staff.  States they are trying to kill her. -5/25 Place patient in restraints -5/25 Haldol 1 -2 mg PRN - 5/25 per psychiatry resume olanzapine and Depakote, if this does not work overnight will reconsult psychiatry in the a.m. as patient was very combative  Acute encephalopathy - Resolved A/O x4 although cursing at staff pulling out lines, throwing objects at staff. - 5/27 very pleasant today follows all commands  Hx stroke - 5/25 negative for acute stroke.  Sinus Tach -5/26 metoprolol IV 5 mg TID. Hold for HR<60 or SBP< 130: We will convert over to p.o. in a.m. - 5/26 lisinopril  5 mg daily  Essential HTN -Seen sinus tachycardia  UTI  positive E. Coli - Complete 5-day course antibiotics     DVT prophylaxis: Lovenox  Code Status: Full Family Communication:  Status is: Inpatient    Dispo: The patient is from: ???              Anticipated d/c is to: Psychiatry made some adjustments to patient's medications on 5/26.  Hopefully by the a.m. will be able to discontinue restraints in order to have a shot at placement.              Anticipated d/c date is: 5/28              Patient currently unstable      Consultants:  Psychiatry  Procedures/Significant Events:  5/25 CT head W0 contrast:No acute intracranial abnormality    I have personally reviewed and interpreted all radiology studies and my findings are as above.  VENTILATOR SETTINGS:    Cultures 5/24 urine positive E. Coli   Antimicrobials: Anti-infectives (From admission, onward)   Start     Ordered Stop   09/19/20 2200  cefTRIAXone (ROCEPHIN) 1 g in sodium chloride 0.9 % 100 mL IVPB        09/18/20 2338     09/18/20 2245  cefTRIAXone (ROCEPHIN) 1 g in sodium chloride 0.9 % 100 mL IVPB        09/18/20 2238 09/18/20 2358       Devices  LINES / TUBES:      Continuous Infusions: . cefTRIAXone (ROCEPHIN)  IV Stopped (09/21/20 0659)     Objective: Vitals:   09/20/20 1732 09/20/20 2030 09/20/20 2319 09/21/20 0403  BP: (!) 172/102 (!) 187/93 96/64 103/69  Pulse:  78  60  Resp:  18 15 16   Temp:  98.3 F (36.8 C)  98.2 F (36.8 C)  TempSrc:  Axillary  Axillary  SpO2:    100%  Weight:      Height:        Intake/Output Summary (Last 24 hours) at 09/21/2020 0845 Last data filed at 09/20/2020 1843 Gross per 24 hour  Intake 386 ml  Output 700 ml  Net -314 ml   Filed Weights   09/19/20 2300  Weight: 67.5 kg    Examination:  General: A/O x4, No acute respiratory distress, initially called but symptoms restraints were removed became extremely belligerent and  combative Eyes: negative scleral hemorrhage, negative anisocoria, negative icterus ENT: Negative Runny nose, negative gingival bleeding, Neck:  Negative scars, masses, torticollis, lymphadenopathy, JVD Lungs: Clear to auscultation bilaterally without wheezes or crackles Cardiovascular: Regular rate and rhythm without murmur gallop or rub normal S1 and S2 Abdomen: negative abdominal pain, nondistended, positive soft, bowel sounds, no rebound, no ascites, no appreciable mass Extremities: No significant cyanosis, clubbing, or edema bilateral lower extremities Skin: Negative rashes, lesions, ulcers Psychiatric:  Negative depression, negative anxiety, negative fatigue, negative mania  Central nervous system:  Cranial nerves II through XII intact, tongue/uvula midline, all extremities muscle strength 5/5, sensation intact throughout, negative dysarthria, negative expressive aphasia, negative receptive aphasia.  .     Data Reviewed: Care during the described time interval was provided by me .  I have reviewed this patient's available data, including medical history, events of note, physical examination, and all test results as part of my evaluation.  CBC: Recent Labs  Lab 09/18/20 1734 09/19/20 0248 09/20/20 0158 09/21/20 0106  WBC 9.6 7.6 6.7 7.8  NEUTROABS 4.8  --  4.1 4.0  HGB 14.6 12.6 14.3 14.1  HCT 48.4* 41.5 47.1* 46.2*  MCV 76.1* 75.7* 75.4* 75.4*  PLT 331 278 333 170   Basic Metabolic Panel: Recent Labs  Lab 09/18/20 1734 09/19/20 0248 09/20/20 0158 09/21/20 0106  NA 139 139 139 138  K 4.6 3.9 4.4 4.1  CL 108 109 108 106  CO2 24 23 21* 22  GLUCOSE 104* 113* 146* 116*  BUN 10 11 9 12   CREATININE 0.92 0.76 0.73 0.93  CALCIUM 9.9 8.8* 10.7* 10.1  MG  --   --  2.0 2.0  PHOS  --   --  3.7 3.9   GFR: Estimated Creatinine Clearance: 55.7 mL/min (by C-G formula based on SCr of 0.93 mg/dL). Liver Function Tests: Recent Labs  Lab 09/18/20 1734 09/19/20 0248  09/20/20 0158 09/21/20 0106  AST 33 31 39 31  ALT 32 28 34 33  ALKPHOS 81 62 80 74  BILITOT 0.6 0.5 0.5 0.5  PROT 8.0 6.4* 8.4* 7.7  ALBUMIN 3.8 2.9* 3.8 3.5   No results for input(s): LIPASE, AMYLASE in the last 168 hours. Recent Labs  Lab 09/18/20 2338  AMMONIA 21   Coagulation Profile: No results for input(s): INR, PROTIME in the last 168 hours. Cardiac Enzymes: No results for input(s): CKTOTAL, CKMB, CKMBINDEX, TROPONINI in the last 168 hours. BNP (last 3 results) No results for input(s): PROBNP in the last 8760 hours. HbA1C: No results for input(s): HGBA1C in the  last 72 hours. CBG: No results for input(s): GLUCAP in the last 168 hours. Lipid Profile: No results for input(s): CHOL, HDL, LDLCALC, TRIG, CHOLHDL, LDLDIRECT in the last 72 hours. Thyroid Function Tests: No results for input(s): TSH, T4TOTAL, FREET4, T3FREE, THYROIDAB in the last 72 hours. Anemia Panel: Recent Labs    09/19/20 0248 09/19/20 0753  VITAMINB12  --  433  FOLATE  --  9.7  FERRITIN  --  25  TIBC  --  423  IRON  --  80  RETICCTPCT 1.2  --    Sepsis Labs: No results for input(s): PROCALCITON, LATICACIDVEN in the last 168 hours.  Recent Results (from the past 240 hour(s))  Urine culture     Status: Abnormal   Collection Time: 09/18/20 10:42 PM   Specimen: Urine, Random  Result Value Ref Range Status   Specimen Description URINE, RANDOM  Final   Special Requests   Final    NONE Performed at [facility], [address], [place], [place] [address]    Culture >=100,000 COLONIES/mL ESCHERICHIA COLI (A)  Final   Report Status 09/21/2020 FINAL  Final   Organism ID, Bacteria ESCHERICHIA COLI (A)  Final      Susceptibility   Escherichia coli - MIC*    AMPICILLIN 8 SENSITIVE Sensitive     CEFAZOLIN <=4 SENSITIVE Sensitive     CEFEPIME <=0.12 SENSITIVE Sensitive     CEFTRIAXONE <=0.25 SENSITIVE Sensitive     CIPROFLOXACIN >=4 RESISTANT Resistant     GENTAMICIN <=1 SENSITIVE  Sensitive     IMIPENEM <=0.25 SENSITIVE Sensitive     NITROFURANTOIN 128 RESISTANT Resistant     TRIMETH/SULFA <=20 SENSITIVE Sensitive     AMPICILLIN/SULBACTAM <=2 SENSITIVE Sensitive     PIP/TAZO <=4 SENSITIVE Sensitive     * >=100,000 COLONIES/mL ESCHERICHIA COLI  Resp Panel by RT-PCR (Flu A&B, Covid) Nasopharyngeal Swab     Status: None   Collection Time: 09/18/20 11:44 PM   Specimen: Nasopharyngeal Swab; Nasopharyngeal(NP) swabs in vial transport medium  Result Value Ref Range Status   SARS Coronavirus 2 by RT PCR NEGATIVE NEGATIVE Final    Comment: (NOTE) SARS-CoV-2 target nucleic acids are NOT DETECTED.  The SARS-CoV-2 RNA is generally detectable in upper respiratory specimens during the acute phase of infection. The lowest concentration of SARS-CoV-2 viral copies this assay can detect is 138 copies/mL. A negative result does not preclude SARS-Cov-2 infection and should not be used as the sole basis for treatment or other patient management decisions. A negative result may occur with  improper specimen collection/handling, submission of specimen other than nasopharyngeal swab, presence of viral mutation(s) within the areas targeted by this assay, and inadequate number of viral copies(<138 copies/mL). A negative result must be combined with clinical observations, patient history, and epidemiological information. The expected result is Negative.  Fact Sheet for Patients:  EntrepreneurPulse.com.au  Fact Sheet for Healthcare Providers:  IncredibleEmployment.be  This test is no t yet approved or cleared by the [place] FDA and  has been authorized for detection and/or diagnosis of SARS-CoV-2 by FDA under an Emergency Use Authorization (EUA). This EUA will remain  in effect (meaning this test can be used) for the duration of the COVID-19 declaration under Section 564(b)(1) of the Act, 21 U.S.C.section 360bbb-3(b)(1), unless the  authorization is terminated  or revoked sooner.       Influenza A by PCR NEGATIVE NEGATIVE Final   Influenza B by PCR NEGATIVE NEGATIVE Final  Comment: (NOTE) The Xpert Xpress SARS-CoV-2/FLU/RSV plus assay is intended as an aid in the diagnosis of influenza from Nasopharyngeal swab specimens and should not be used as a sole basis for treatment. Nasal washings and aspirates are unacceptable for Xpert Xpress SARS-CoV-2/FLU/RSV testing.  Fact Sheet for Patients: EntrepreneurPulse.com.au  Fact Sheet for Healthcare Providers: IncredibleEmployment.be  This test is not yet approved or cleared by the [place] FDA and has been authorized for detection and/or diagnosis of SARS-CoV-2 by FDA under an Emergency Use Authorization (EUA). This EUA will remain in effect (meaning this test can be used) for the duration of the COVID-19 declaration under Section 564(b)(1) of the Act, 21 U.S.C. section 360bbb-3(b)(1), unless the authorization is terminated or revoked.  Performed at [facility], [place] [address] [place], [place] [address]          Radiology Studies: No results found.      Scheduled Meds: . divalproex  500 mg Oral Q12H  . enoxaparin (LOVENOX) injection  40 mg Subcutaneous QHS  . lisinopril  5 mg Oral Daily  . metoprolol tartrate  5 mg Intravenous Q8H  . OLANZapine zydis  10 mg Oral QHS  . OLANZapine zydis  2.5 mg Oral q morning  . valbenazine  40 mg Oral QHS   Continuous Infusions: . cefTRIAXone (ROCEPHIN)  IV Stopped (09/21/20 0659)     LOS: 2 days    Time spent:40 min    Chenoa Luddy, Geraldo Docker, MD [facility]   If 7PM-7AM, please contact night-coverage 09/21/2020, 8:45 AM

## 2020-09-21 NOTE — Consult Note (Addendum)
Karen Best a 65 y.o.femalepast medical history of schizophrenia, schizoaffective disorder bipolar type, CVA, presenting to the ED for evaluation of feeling unwell. It is reported that patient arrived via [employer], however it is reported that [employer] left the patient at triage and nursing was unable to obtain any story from [employer] prior to their departure. It is unclear why they were called and there is no history of prior to patient's arrival today. She states however she does not feel well. She endorses body aches. Upon further discussion, patient is unable to provide adequate history. She is tangential with disorganized speech though pleasantand in no distress. Level 5 caveat secondary to status change.  Patient re seen and assessed, chart review performed.  Patient is alert yet groggy this morning. She is unable to remain awake during the evaluation this morning, appears to be an acute change from her previous evaluations conducted by this provider. Patient does not appear to have received any additional prn medications this morning. She last received Haldol 2mg  IV on 05/26 at 1600. There appears to be limited nursing documentation as to why this was administered. Patient is also noted to continue to be in bilateral wrist restraints.   -Labs reviewed are within normal limits this morning. Will order CK and Depakote level for in the morning.  -Initiate delirium precautions. She is at risk for developing delirium.  -Will adjust olanzapine dose to include DC morning dose as patient is too groggy (sedated) to participate in evlaution. Continue olanzapine 10mg  po qhs.   -Psychiatry to reassess tomorrow, as patient appears to have waxing and waning of cognitive impairment and has become combative. She is still restrained at the time of this evaluation.

## 2020-09-22 DIAGNOSIS — G9340 Encephalopathy, unspecified: Secondary | ICD-10-CM | POA: Diagnosis not present

## 2020-09-22 DIAGNOSIS — R410 Disorientation, unspecified: Secondary | ICD-10-CM | POA: Diagnosis not present

## 2020-09-22 DIAGNOSIS — F200 Paranoid schizophrenia: Secondary | ICD-10-CM | POA: Diagnosis not present

## 2020-09-22 DIAGNOSIS — N390 Urinary tract infection, site not specified: Secondary | ICD-10-CM | POA: Diagnosis not present

## 2020-09-22 LAB — COMPREHENSIVE METABOLIC PANEL
ALT: 35 U/L (ref 0–44)
AST: 34 U/L (ref 15–41)
Albumin: 3.1 g/dL — ABNORMAL LOW (ref 3.5–5.0)
Alkaline Phosphatase: 70 U/L (ref 38–126)
Anion gap: 7 (ref 5–15)
BUN: 19 mg/dL (ref 8–23)
CO2: 26 mmol/L (ref 22–32)
Calcium: 9.6 mg/dL (ref 8.9–10.3)
Chloride: 104 mmol/L (ref 98–111)
Creatinine, Ser: 0.93 mg/dL (ref 0.44–1.00)
GFR, Estimated: >60 mL/min (ref >60)
Glucose, Bld: 112 mg/dL — ABNORMAL HIGH (ref 70–99)
Potassium: 4.3 mmol/L (ref 3.5–5.1)
Sodium: 137 mmol/L (ref 135–145)
Total Bilirubin: 0.4 mg/dL (ref 0.3–1.2)
Total Protein: 7.2 g/dL (ref 6.5–8.1)

## 2020-09-22 LAB — CBC WITH DIFFERENTIAL/PLATELET
Abs Immature Granulocytes: 0.04 10*3/uL (ref 0.00–0.07)
Basophils Absolute: 0 10*3/uL (ref 0.0–0.1)
Basophils Relative: 0 %
Eosinophils Absolute: 0.4 10*3/uL (ref 0.0–0.5)
Eosinophils Relative: 5 %
HCT: 45.5 % (ref 36.0–46.0)
Hemoglobin: 13.9 g/dL (ref 12.0–15.0)
Immature Granulocytes: 1 %
Lymphocytes Relative: 51 %
Lymphs Abs: 3.8 10*3/uL (ref 0.7–4.0)
MCH: 23 pg — ABNORMAL LOW (ref 26.0–34.0)
MCHC: 30.5 g/dL (ref 30.0–36.0)
MCV: 75.2 fL — ABNORMAL LOW (ref 80.0–100.0)
Monocytes Absolute: 0.6 10*3/uL (ref 0.1–1.0)
Monocytes Relative: 8 %
Neutro Abs: 2.5 10*3/uL (ref 1.7–7.7)
Neutrophils Relative %: 35 %
Platelets: 295 10*3/uL (ref 150–400)
RBC: 6.05 MIL/uL — ABNORMAL HIGH (ref 3.87–5.11)
RDW: 15.4 % (ref 11.5–15.5)
WBC: 7.3 10*3/uL (ref 4.0–10.5)
nRBC: 0 % (ref 0.0–0.2)

## 2020-09-22 LAB — PHOSPHORUS: Phosphorus: 4.7 mg/dL — ABNORMAL HIGH (ref 2.5–4.6)

## 2020-09-22 LAB — VALPROIC ACID LEVEL: Valproic Acid Lvl: 68 ug/mL (ref 50.0–100.0)

## 2020-09-22 LAB — MAGNESIUM: Magnesium: 1.9 mg/dL (ref 1.7–2.4)

## 2020-09-22 NOTE — Consult Note (Signed)
Attempt to re-assess patient this afternoon is unsuccessful, she is asleep and unable to participate in psychiatric evaluation. However, staff nurse reports that patient has become less agitated, beligerent but remains confused and disoriented. -Labs reviewed are within normal limits. -Depakote level-68 also within normal limits.    Recommendations: -Continue Delirium precautions. -Continue olanzapine 10mg  po qhs.   -Psychiatry to reassess tomorrow, as patient appears to have waxing and waning of cognitive impairment and has become combative. She is still restrained at the time of this evaluation.   Corena Pilgrim, MD Attending psychiatrist.

## 2020-09-22 NOTE — Progress Notes (Signed)
PROGRESS NOTE    Karen Best  QIO:[id number] DOB: 05/04/1955 DOA: 09/18/2020 PCP: [facility]     Brief Narrative:  Karen Best is a 65 y.o. BF PMHx bipolar 1 DO, schizophrenia, Hx of substance abuse, f Hx stroke in June 2021,   Brought to the ER after patient was found to be confused.  Does not know exactly when patient got confused and who found the patient and how patient was brought to the ER.  Patient has still does not remember who called EMS.  ED Course: In the ER patient is not agitated but appears confused but oriented to her name and place.  Patient is afebrile and UA is concerning for UTI.  No family contacts available in the chart.  At this time patient was started on ceftriaxone and admitted for further observation for acute encephalopathy secondary UTI.  CT head is pending.  COVID test is negative.   Subjective: 5/28 afebrile overnight afebrile overnight.  Patient not very talkative today, but does cooperate   Assessment & Plan: Covid vaccination; no vaccination does not want vaccination   Principal Problem:   Acute encephalopathy Active Problems:   Acute UTI (urinary tract infection)   Schizoaffective disorder, bipolar type ([place])   Paranoid schizophrenia - 5/25 patient has become belligerent throwing trees at staff, kicking and punching staff.  States they are trying to kill her. -5/25 Place patient in restraints -5/25 Haldol 1 -2 mg PRN - 5/25 per psychiatry resume olanzapine and Depakote, if this does not work overnight will reconsult psychiatry in the a.m. as patient was very combative  Acute encephalopathy - Resolved A/O x4 although cursing at staff pulling out lines, throwing objects at staff. - 5/27 very pleasant today follows all commands  Hx stroke - 5/25 negative for acute stroke.  Sinus Tach -5/26 metoprolol IV 5 mg TID. Hold for HR<60 or SBP< 130: We will convert over to p.o. in a.m. - 5/26 lisinopril 5 mg  daily  Essential HTN -Seen sinus tachycardia  UTI  positive E. Coli - Complete 5-day course antibiotics     DVT prophylaxis: Lovenox  Code Status: Full Family Communication:  Status is: Inpatient    Dispo: The patient is from: ???              Anticipated d/c is to: Psychiatry made some adjustments to patient's medications on 5/26.  Hopefully by the a.m. will be able to discontinue restraints in order to have a shot at placement.              Anticipated d/c date is: 5/28              Patient currently unstable      Consultants:  Psychiatry  Procedures/Significant Events:  5/25 CT head W0 contrast:No acute intracranial abnormality    I have personally reviewed and interpreted all radiology studies and my findings are as above.  VENTILATOR SETTINGS:    Cultures 5/24 urine positive E. Coli   Antimicrobials: Anti-infectives (From admission, onward)   Start     Ordered Stop   09/19/20 2200  cefTRIAXone (ROCEPHIN) 1 g in sodium chloride 0.9 % 100 mL IVPB        09/18/20 2338     09/18/20 2245  cefTRIAXone (ROCEPHIN) 1 g in sodium chloride 0.9 % 100 mL IVPB        09/18/20 2238 09/18/20 2358       Devices    LINES /  TUBES:      Continuous Infusions: . cefTRIAXone (ROCEPHIN)  IV Stopped (09/22/20 0511)     Objective: Vitals:   09/21/20 1250 09/21/20 2000 09/22/20 0400 09/22/20 0502  BP: 96/65 118/66 115/69 106/70  Pulse: (!) 54 67 60 (!) 59  Resp: 15 16 14 12   Temp: 98.8 F (37.1 C) 98.9 F (37.2 C) 98.7 F (37.1 C)   TempSrc: Axillary Axillary Axillary   SpO2: 96% 96% 94% 97%  Weight:      Height:        Intake/Output Summary (Last 24 hours) at 09/22/2020 1014 Last data filed at 09/22/2020 4562 Gross per 24 hour  Intake 840 ml  Output 800 ml  Net 40 ml   Filed Weights   09/19/20 2300  Weight: 67.5 kg    Examination:  General: A/O x4, No acute respiratory distress, initially called but symptoms restraints were removed became  extremely belligerent and combative Eyes: negative scleral hemorrhage, negative anisocoria, negative icterus ENT: Negative Runny nose, negative gingival bleeding, Neck:  Negative scars, masses, torticollis, lymphadenopathy, JVD Lungs: Clear to auscultation bilaterally without wheezes or crackles Cardiovascular: Regular rate and rhythm without murmur gallop or rub normal S1 and S2 Abdomen: negative abdominal pain, nondistended, positive soft, bowel sounds, no rebound, no ascites, no appreciable mass Extremities: No significant cyanosis, clubbing, or edema bilateral lower extremities Skin: Negative rashes, lesions, ulcers Psychiatric:  Negative depression, negative anxiety, negative fatigue, negative mania  Central nervous system:  Cranial nerves II through XII intact, tongue/uvula midline, all extremities muscle strength 5/5, sensation intact throughout, negative dysarthria, negative expressive aphasia, negative receptive aphasia.  .     Data Reviewed: Care during the described time interval was provided by me .  I have reviewed this patient's available data, including medical history, events of note, physical examination, and all test results as part of my evaluation.  CBC: Recent Labs  Lab 09/18/20 1734 09/19/20 0248 09/20/20 0158 09/21/20 0106 09/22/20 0037  WBC 9.6 7.6 6.7 7.8 7.3  NEUTROABS 4.8  --  4.1 4.0 2.5  HGB 14.6 12.6 14.3 14.1 13.9  HCT 48.4* 41.5 47.1* 46.2* 45.5  MCV 76.1* 75.7* 75.4* 75.4* 75.2*  PLT 331 278 333 316 563   Basic Metabolic Panel: Recent Labs  Lab 09/18/20 1734 09/19/20 0248 09/20/20 0158 09/21/20 0106 09/22/20 0037  NA 139 139 139 138 137  K 4.6 3.9 4.4 4.1 4.3  CL 108 109 108 106 104  CO2 24 23 21* 22 26  GLUCOSE 104* 113* 146* 116* 112*  BUN 10 11 9 12 19   CREATININE 0.92 0.76 0.73 0.93 0.93  CALCIUM 9.9 8.8* 10.7* 10.1 9.6  MG  --   --  2.0 2.0 1.9  PHOS  --   --  3.7 3.9 4.7*   GFR: Estimated Creatinine Clearance: 55.7 mL/min (by  C-G formula based on SCr of 0.93 mg/dL). Liver Function Tests: Recent Labs  Lab 09/18/20 1734 09/19/20 0248 09/20/20 0158 09/21/20 0106 09/22/20 0037  AST 33 31 39 31 34  ALT 32 28 34 33 35  ALKPHOS 81 62 80 74 70  BILITOT 0.6 0.5 0.5 0.5 0.4  PROT 8.0 6.4* 8.4* 7.7 7.2  ALBUMIN 3.8 2.9* 3.8 3.5 3.1*   No results for input(s): LIPASE, AMYLASE in the last 168 hours. Recent Labs  Lab 09/18/20 2338  AMMONIA 21   Coagulation Profile: No results for input(s): INR, PROTIME in the last 168 hours. Cardiac Enzymes: No results for input(s): CKTOTAL, CKMB,  CKMBINDEX, TROPONINI in the last 168 hours. BNP (last 3 results) No results for input(s): PROBNP in the last 8760 hours. HbA1C: No results for input(s): HGBA1C in the last 72 hours. CBG: No results for input(s): GLUCAP in the last 168 hours. Lipid Profile: No results for input(s): CHOL, HDL, LDLCALC, TRIG, CHOLHDL, LDLDIRECT in the last 72 hours. Thyroid Function Tests: No results for input(s): TSH, T4TOTAL, FREET4, T3FREE, THYROIDAB in the last 72 hours. Anemia Panel: No results for input(s): VITAMINB12, FOLATE, FERRITIN, TIBC, IRON, RETICCTPCT in the last 72 hours. Sepsis Labs: No results for input(s): PROCALCITON, LATICACIDVEN in the last 168 hours.  Recent Results (from the past 240 hour(s))  Urine culture     Status: Abnormal   Collection Time: 09/18/20 10:42 PM   Specimen: Urine, Random  Result Value Ref Range Status   Specimen Description URINE, RANDOM  Final   Special Requests   Final    NONE Performed at [facility], [address], [place], [place] [address]    Culture >=100,000 COLONIES/mL ESCHERICHIA COLI (A)  Final   Report Status 09/21/2020 FINAL  Final   Organism ID, Bacteria ESCHERICHIA COLI (A)  Final      Susceptibility   Escherichia coli - MIC*    AMPICILLIN 8 SENSITIVE Sensitive     CEFAZOLIN <=4 SENSITIVE Sensitive     CEFEPIME <=0.12 SENSITIVE Sensitive     CEFTRIAXONE <=0.25 SENSITIVE  Sensitive     CIPROFLOXACIN >=4 RESISTANT Resistant     GENTAMICIN <=1 SENSITIVE Sensitive     IMIPENEM <=0.25 SENSITIVE Sensitive     NITROFURANTOIN 128 RESISTANT Resistant     TRIMETH/SULFA <=20 SENSITIVE Sensitive     AMPICILLIN/SULBACTAM <=2 SENSITIVE Sensitive     PIP/TAZO <=4 SENSITIVE Sensitive     * >=100,000 COLONIES/mL ESCHERICHIA COLI  Resp Panel by RT-PCR (Flu A&B, Covid) Nasopharyngeal Swab     Status: None   Collection Time: 09/18/20 11:44 PM   Specimen: Nasopharyngeal Swab; Nasopharyngeal(NP) swabs in vial transport medium  Result Value Ref Range Status   SARS Coronavirus 2 by RT PCR NEGATIVE NEGATIVE Final    Comment: (NOTE) SARS-CoV-2 target nucleic acids are NOT DETECTED.  The SARS-CoV-2 RNA is generally detectable in upper respiratory specimens during the acute phase of infection. The lowest concentration of SARS-CoV-2 viral copies this assay can detect is 138 copies/mL. A negative result does not preclude SARS-Cov-2 infection and should not be used as the sole basis for treatment or other patient management decisions. A negative result may occur with  improper specimen collection/handling, submission of specimen other than nasopharyngeal swab, presence of viral mutation(s) within the areas targeted by this assay, and inadequate number of viral copies(<138 copies/mL). A negative result must be combined with clinical observations, patient history, and epidemiological information. The expected result is Negative.  Fact Sheet for Patients:  EntrepreneurPulse.com.au  Fact Sheet for Healthcare Providers:  IncredibleEmployment.be  This test is no t yet approved or cleared by the [place] FDA and  has been authorized for detection and/or diagnosis of SARS-CoV-2 by FDA under an Emergency Use Authorization (EUA). This EUA will remain  in effect (meaning this test can be used) for the duration of the COVID-19 declaration under  Section 564(b)(1) of the Act, 21 U.S.C.section 360bbb-3(b)(1), unless the authorization is terminated  or revoked sooner.       Influenza A by PCR NEGATIVE NEGATIVE Final   Influenza B by PCR NEGATIVE NEGATIVE Final    Comment: (NOTE) The  Xpert Xpress SARS-CoV-2/FLU/RSV plus assay is intended as an aid in the diagnosis of influenza from Nasopharyngeal swab specimens and should not be used as a sole basis for treatment. Nasal washings and aspirates are unacceptable for Xpert Xpress SARS-CoV-2/FLU/RSV testing.  Fact Sheet for Patients: EntrepreneurPulse.com.au  Fact Sheet for Healthcare Providers: IncredibleEmployment.be  This test is not yet approved or cleared by the [place] FDA and has been authorized for detection and/or diagnosis of SARS-CoV-2 by FDA under an Emergency Use Authorization (EUA). This EUA will remain in effect (meaning this test can be used) for the duration of the COVID-19 declaration under Section 564(b)(1) of the Act, 21 U.S.C. section 360bbb-3(b)(1), unless the authorization is terminated or revoked.  Performed at [facility], [address] [place], [place] [address]          Radiology Studies: No results found.      Scheduled Meds: . divalproex  500 mg Oral Q12H  . enoxaparin (LOVENOX) injection  40 mg Subcutaneous QHS  . lisinopril  5 mg Oral Daily  . metoprolol tartrate  5 mg Intravenous Q8H  . OLANZapine zydis  10 mg Oral QHS  . valbenazine  40 mg Oral QHS   Continuous Infusions: . cefTRIAXone (ROCEPHIN)  IV Stopped (09/22/20 0511)     LOS: 3 days    Time spent:40 min    Freja Faro, Geraldo Docker, MD [facility]   If 7PM-7AM, please contact night-coverage 09/22/2020, 10:14 AM

## 2020-09-22 NOTE — Progress Notes (Signed)
Patient's heart rate 57-65. Notified MD, new order received.

## 2020-09-23 DIAGNOSIS — R410 Disorientation, unspecified: Secondary | ICD-10-CM | POA: Diagnosis not present

## 2020-09-23 DIAGNOSIS — N390 Urinary tract infection, site not specified: Secondary | ICD-10-CM | POA: Diagnosis not present

## 2020-09-23 DIAGNOSIS — G9340 Encephalopathy, unspecified: Secondary | ICD-10-CM | POA: Diagnosis not present

## 2020-09-23 DIAGNOSIS — F200 Paranoid schizophrenia: Secondary | ICD-10-CM | POA: Diagnosis not present

## 2020-09-23 LAB — CBC WITH DIFFERENTIAL/PLATELET
Abs Immature Granulocytes: 0.04 10*3/uL (ref 0.00–0.07)
Basophils Absolute: 0 10*3/uL (ref 0.0–0.1)
Basophils Relative: 0 %
Eosinophils Absolute: 0.2 10*3/uL (ref 0.0–0.5)
Eosinophils Relative: 2 %
HCT: 45.7 % (ref 36.0–46.0)
Hemoglobin: 14.3 g/dL (ref 12.0–15.0)
Immature Granulocytes: 0 %
Lymphocytes Relative: 41 %
Lymphs Abs: 3.7 10*3/uL (ref 0.7–4.0)
MCH: 23.3 pg — ABNORMAL LOW (ref 26.0–34.0)
MCHC: 31.3 g/dL (ref 30.0–36.0)
MCV: 74.3 fL — ABNORMAL LOW (ref 80.0–100.0)
Monocytes Absolute: 0.9 10*3/uL (ref 0.1–1.0)
Monocytes Relative: 10 %
Neutro Abs: 4.2 10*3/uL (ref 1.7–7.7)
Neutrophils Relative %: 47 %
Platelets: 302 10*3/uL (ref 150–400)
RBC: 6.15 MIL/uL — ABNORMAL HIGH (ref 3.87–5.11)
RDW: 15.3 % (ref 11.5–15.5)
WBC: 9 10*3/uL (ref 4.0–10.5)
nRBC: 0 % (ref 0.0–0.2)

## 2020-09-23 LAB — MAGNESIUM: Magnesium: 2.1 mg/dL (ref 1.7–2.4)

## 2020-09-23 LAB — COMPREHENSIVE METABOLIC PANEL
ALT: 40 U/L (ref 0–44)
AST: 38 U/L (ref 15–41)
Albumin: 3.2 g/dL — ABNORMAL LOW (ref 3.5–5.0)
Alkaline Phosphatase: 71 U/L (ref 38–126)
Anion gap: 7 (ref 5–15)
BUN: 12 mg/dL (ref 8–23)
CO2: 23 mmol/L (ref 22–32)
Calcium: 9.9 mg/dL (ref 8.9–10.3)
Chloride: 106 mmol/L (ref 98–111)
Creatinine, Ser: 0.67 mg/dL (ref 0.44–1.00)
GFR, Estimated: >60 mL/min (ref >60)
Glucose, Bld: 94 mg/dL (ref 70–99)
Potassium: 4.9 mmol/L (ref 3.5–5.1)
Sodium: 136 mmol/L (ref 135–145)
Total Bilirubin: 0.3 mg/dL (ref 0.3–1.2)
Total Protein: 7.3 g/dL (ref 6.5–8.1)

## 2020-09-23 LAB — PHOSPHORUS: Phosphorus: 3.4 mg/dL (ref 2.5–4.6)

## 2020-09-23 MED ORDER — SODIUM CHLORIDE 0.9 % IV SOLN
INTRAVENOUS | Status: DC | PRN
  Administered 2020-09-23: 250 mL via INTRAVENOUS

## 2020-09-23 MED ORDER — METOPROLOL TARTRATE 12.5 MG HALF TABLET
12.5000 mg | ORAL_TABLET | Freq: Two times a day (BID) | ORAL | Status: DC
  Administered 2020-09-23 – 2020-09-26 (×6): 12.5 mg via ORAL
  Filled 2020-09-23 (×6): qty 1

## 2020-09-23 NOTE — Consult Note (Signed)
[facility] Consult   Reason for Consult:''belligerent attacking the staff whenever they enter the room stating they are trying to kill her.''   Referring Physician:  Orlean Patten, MD Patient Identification: Karen Best MRN:  [id number] Principal Diagnosis: Acute encephalopathy Diagnosis:  Principal Problem:   Acute encephalopathy Active Problems:   Acute UTI (urinary tract infection)   Schizoaffective disorder, bipolar type ([place])   Total Time spent with patient: 45 minutes  Subjective:   Karen Best is a 65 y.o. female patient admitted with altered mental status  HPI: Patient with past history of schizophrenia, schizoaffective disorder bipolar type, CVA who was admitted to the hospital due to altered mental status. During admission, patient was combative, disorganized, belligerent and paranoid. Today, she is alert, awake and oriented. She reports being non-complaint with her medications at home. However, she seems to be showing favorable response to her current medication regimen. She is calm, cooperative, denies psychosis, delusions, self harming thoughts but a little lethargic probably from medications.  Past Psychiatric History: as above  Risk to Self:  denies Risk to Others:  denies Prior Inpatient Therapy:   Prior Outpatient Therapy:    Past Medical History:  Past Medical History:  Diagnosis Date  . Asthma   . Bipolar 1 disorder ([place])   . Fibromyalgia   . Schizophrenia ([place])   . Tobacco abuse     Past Surgical History:  Procedure Laterality Date  . CESAREAN SECTION    . TUBAL LIGATION     Family History:  Family History  Problem Relation Age of Onset  . Liver cancer Neg Hx   . Liver disease Neg Hx    Family Psychiatric  History: Social History:  Social History   Substance and Sexual Activity  Alcohol Use Yes   Comment: occ     Social History   Substance and Sexual Activity  Drug Use Yes  . Types: Cocaine   Comment: crack  cocaine (last use 10/25/2018)    Social History   Socioeconomic History  . Marital status: Legally Separated    Spouse name: Not on file  . Number of children: Not on file  . Years of education: Not on file  . Highest education level: Not on file  Occupational History  . Not on file  Tobacco Use  . Smoking status: Current Every Day Smoker    Packs/day: 0.50    Types: Cigarettes  . Smokeless tobacco: Never Used  Vaping Use  . Vaping Use: Never used  Substance and Sexual Activity  . Alcohol use: Yes    Comment: occ  . Drug use: Yes    Types: Cocaine    Comment: crack cocaine (last use 10/25/2018)  . Sexual activity: Yes    Birth control/protection: None  Other Topics Concern  . Not on file  Social History Narrative  . Not on file   Social Determinants of Health   Financial Resource Strain: Not on file  Food Insecurity: Not on file  Transportation Needs: Not on file  Physical Activity: Not on file  Stress: Not on file  Social Connections: Not on file   Additional Social History:    Allergies:  No Known Allergies  Labs:  Results for orders placed or performed during the hospital encounter of 09/18/20 (from the past 48 hour(s))  CBC with Differential/Platelet     Status: Abnormal   Collection Time: 09/22/20 12:37 AM  Result Value Ref Range   WBC 7.3 4.0 - 10.5 K/uL  RBC 6.05 (H) 3.87 - 5.11 MIL/uL   Hemoglobin 13.9 12.0 - 15.0 g/dL   HCT 45.5 36.0 - 46.0 %   MCV 75.2 (L) 80.0 - 100.0 fL   MCH 23.0 (L) 26.0 - 34.0 pg   MCHC 30.5 30.0 - 36.0 g/dL   RDW 15.4 11.5 - 15.5 %   Platelets 295 150 - 400 K/uL   nRBC 0.0 0.0 - 0.2 %   Neutrophils Relative % 35 %   Neutro Abs 2.5 1.7 - 7.7 K/uL   Lymphocytes Relative 51 %   Lymphs Abs 3.8 0.7 - 4.0 K/uL   Monocytes Relative 8 %   Monocytes Absolute 0.6 0.1 - 1.0 K/uL   Eosinophils Relative 5 %   Eosinophils Absolute 0.4 0.0 - 0.5 K/uL   Basophils Relative 0 %   Basophils Absolute 0.0 0.0 - 0.1 K/uL   Immature  Granulocytes 1 %   Abs Immature Granulocytes 0.04 0.00 - 0.07 K/uL    Comment: Performed at [facility] [address] [place], [place] [address]  Comprehensive metabolic panel     Status: Abnormal   Collection Time: 09/22/20 12:37 AM  Result Value Ref Range   Sodium 137 135 - 145 mmol/L   Potassium 4.3 3.5 - 5.1 mmol/L   Chloride 104 98 - 111 mmol/L   CO2 26 22 - 32 mmol/L   Glucose, Bld 112 (H) 70 - 99 mg/dL    Comment: Glucose reference range applies only to samples taken after fasting for at least 8 hours.   BUN 19 8 - 23 mg/dL   Creatinine, Ser 0.93 0.44 - 1.00 mg/dL   Calcium 9.6 8.9 - 10.3 mg/dL   Total Protein 7.2 6.5 - 8.1 g/dL   Albumin 3.1 (L) 3.5 - 5.0 g/dL   AST 34 15 - 41 U/L   ALT 35 0 - 44 U/L   Alkaline Phosphatase 70 38 - 126 U/L   Total Bilirubin 0.4 0.3 - 1.2 mg/dL   GFR, Estimated >60 >60 mL/min    Comment: (NOTE) Calculated using the CKD-EPI Creatinine Equation (2021)    Anion gap 7 5 - 15    Comment: Performed at [facility] [address] [place], [place] [address]  Magnesium     Status: None   Collection Time: 09/22/20 12:37 AM  Result Value Ref Range   Magnesium 1.9 1.7 - 2.4 mg/dL    Comment: Performed at [facility] [address] [place], [place] [address]  Phosphorus     Status: Abnormal   Collection Time: 09/22/20 12:37 AM  Result Value Ref Range   Phosphorus 4.7 (H) 2.5 - 4.6 mg/dL    Comment: Performed at [facility] [address] [place], [place] [address]  Valproic acid level     Status: None   Collection Time: 09/22/20 12:37 AM  Result Value Ref Range   Valproic Acid Lvl 68 50.0 - 100.0 ug/mL    Comment: Performed at [facility] [address] [place], [place] [address]  CBC with Differential/Platelet     Status: Abnormal   Collection Time: 09/23/20 12:17 AM  Result Value Ref Range   WBC 9.0 4.0 - 10.5 K/uL   RBC 6.15 (H) 3.87 - 5.11 MIL/uL   Hemoglobin 14.3 12.0 - 15.0 g/dL   HCT 45.7  36.0 - 46.0 %   MCV 74.3 (L) 80.0 - 100.0 fL   MCH 23.3 (L) 26.0 -  34.0 pg   MCHC 31.3 30.0 - 36.0 g/dL   RDW 15.3 11.5 - 15.5 %   Platelets 302 150 - 400 K/uL   nRBC 0.0 0.0 - 0.2 %   Neutrophils Relative % 47 %   Neutro Abs 4.2 1.7 - 7.7 K/uL   Lymphocytes Relative 41 %   Lymphs Abs 3.7 0.7 - 4.0 K/uL   Monocytes Relative 10 %   Monocytes Absolute 0.9 0.1 - 1.0 K/uL   Eosinophils Relative 2 %   Eosinophils Absolute 0.2 0.0 - 0.5 K/uL   Basophils Relative 0 %   Basophils Absolute 0.0 0.0 - 0.1 K/uL   Immature Granulocytes 0 %   Abs Immature Granulocytes 0.04 0.00 - 0.07 K/uL    Comment: Performed at [facility] [address], [place], [place] [address]  Comprehensive metabolic panel     Status: Abnormal   Collection Time: 09/23/20 12:17 AM  Result Value Ref Range   Sodium 136 135 - 145 mmol/L   Potassium 4.9 3.5 - 5.1 mmol/L   Chloride 106 98 - 111 mmol/L   CO2 23 22 - 32 mmol/L   Glucose, Bld 94 70 - 99 mg/dL    Comment: Glucose reference range applies only to samples taken after fasting for at least 8 hours.   BUN 12 8 - 23 mg/dL   Creatinine, Ser 0.67 0.44 - 1.00 mg/dL   Calcium 9.9 8.9 - 10.3 mg/dL   Total Protein 7.3 6.5 - 8.1 g/dL   Albumin 3.2 (L) 3.5 - 5.0 g/dL   AST 38 15 - 41 U/L   ALT 40 0 - 44 U/L   Alkaline Phosphatase 71 38 - 126 U/L   Total Bilirubin 0.3 0.3 - 1.2 mg/dL   GFR, Estimated >60 >60 mL/min    Comment: (NOTE) Calculated using the CKD-EPI Creatinine Equation (2021)    Anion gap 7 5 - 15    Comment: Performed at [facility] [address] [place], [place] [address]  Magnesium     Status: None   Collection Time: 09/23/20 12:17 AM  Result Value Ref Range   Magnesium 2.1 1.7 - 2.4 mg/dL    Comment: Performed at [facility] [address], [place], [place] [address]  Phosphorus     Status: None   Collection Time: 09/23/20 12:17 AM  Result Value Ref Range   Phosphorus 3.4 2.5 - 4.6 mg/dL    Comment: Performed at [facility] [address], [place], [place] [address]    Current Facility-Administered Medications  Medication Dose Route Frequency Provider Last Rate Last Admin  . 0.9 %  sodium chloride infusion   Intravenous PRN Allie Bossier, MD      . cefTRIAXone (ROCEPHIN) 1 g in sodium chloride 0.9 % 100 mL IVPB  1 g Intravenous Q24H Rise Patience, MD   Stopped at 09/22/20 2143  . divalproex (DEPAKOTE) DR tablet 500 mg  500 mg Oral Q12H Suella Broad, FNP   500 mg at 09/23/20 0904  . enoxaparin (LOVENOX) injection 40 mg  40 mg Subcutaneous QHS Allie Bossier, MD   40 mg at 09/22/20 2106  . haloperidol lactate (HALDOL) injection 1-2 mg  1-2 mg Intravenous Q6H PRN Allie Bossier, MD   2 mg at 09/20/20 1605  . lisinopril (ZESTRIL) tablet 5 mg  5 mg Oral Daily Allie Bossier, MD   5 mg at 09/23/20 8916  .  metoprolol tartrate (LOPRESSOR) injection 5 mg  5 mg Intravenous Q8H Allie Bossier, MD   5 mg at 09/23/20 0630  . OLANZapine zydis (ZYPREXA) disintegrating tablet 10 mg  10 mg Oral QHS Suella Broad, FNP   10 mg at 09/22/20 2054  . valbenazine (INGREZZA) capsule 40 mg  40 mg Oral QHS Suella Broad, FNP   40 mg at 09/22/20 2106    Musculoskeletal: Strength & Muscle Tone: not tested Gait & Station: not tested Patient leans: N/A    Psychiatric Specialty Exam:  Presentation  General Appearance: Appropriate for Environment  Eye Contact:Good  Speech:Clear and Coherent  Speech Volume:Decreased  Handedness:Right   Mood and Affect  Mood:Dysphoric  Affect:Constricted   Thought Process  Thought Processes:Goal Directed  Descriptions of Associations:Intact  Orientation:Full (Time, Place and Person)  Thought Content:Logical  History of Schizophrenia/Schizoaffective disorder:Yes  Duration of Psychotic Symptoms:Less than six months  Hallucinations:Hallucinations: None  Ideas of Reference:None  Suicidal Thoughts:Suicidal Thoughts:  No  Homicidal Thoughts:Homicidal Thoughts: No   Sensorium  Memory:Immediate Fair; Recent Fair; Remote Fair  Judgment:Fair  Insight:Fair   Executive Functions  Concentration:Fair  Attention Span:Fair  [place]   Psychomotor Activity  Psychomotor Activity:Psychomotor Activity: Psychomotor Retardation   Assets  Assets:Desire for Improvement   Sleep  Sleep:Sleep: Fair   Physical Exam: Physical Exam Psychiatric:        Attention and Perception: Perception normal.        Mood and Affect: Affect is blunt.        Speech: Speech normal.        Behavior: Behavior is cooperative.        Thought Content: Thought content normal.        Cognition and Memory: Cognition and memory normal.        Judgment: Judgment normal.    ROS Blood pressure 129/69, pulse 67, temperature 97.8 F (36.6 C), temperature source Axillary, resp. rate 15, height 5\' 5"  (1.651 m), weight 67.5 kg, SpO2 93 %. Body mass index is 24.76 kg/m.  Treatment Plan Summary: 65 year old female with multiple medical issues and history of mental illness who is non-compliant with her psychotropics. She was admitted due to altered mental status but she is now alert, awake and cooperative.  Recommendations: -Continue Olanzapine 10 mg at bedtime for psychosis/delusions -Continue Depakote 500 mg BID for agitation -Continue INGREZZA 40mg  at bedtime for TD -Consider Social worker consult for psychosocial issues  Disposition: No evidence of imminent risk to self or others at present.   Patient does not meet criteria for psychiatric inpatient admission. Supportive therapy provided about ongoing stressors. Psychiatric service will follow patient as needed  Corena Pilgrim, MD 09/23/2020 10:13 AM

## 2020-09-23 NOTE — Progress Notes (Signed)
PROGRESS NOTE    Karen Best  WJX:[id number] DOB: Feb 11, 1956 DOA: 09/18/2020 PCP: [facility]     Brief Narrative:  Karen Best is a 65 y.o. BF PMHx bipolar 1 DO, schizophrenia, Hx of substance abuse, f Hx stroke in June 2021,   Brought to the ER after patient was found to be confused.  Does not know exactly when patient got confused and who found the patient and how patient was brought to the ER.  Patient has still does not remember who called EMS.  ED Course: In the ER patient is not agitated but appears confused but oriented to her name and place.  Patient is afebrile and UA is concerning for UTI.  No family contacts available in the chart.  At this time patient was started on ceftriaxone and admitted for further observation for acute encephalopathy secondary UTI.  CT head is pending.  COVID test is negative.   Subjective: 5/29 afebrile overnight, A/O x3 (does not know when).  Cooperative however much more quiet and subdued    Assessment & Plan: Covid vaccination; no vaccination does not want vaccination   Principal Problem:   Acute encephalopathy Active Problems:   Acute UTI (urinary tract infection)   Schizoaffective disorder, bipolar type ([place])   Paranoid schizophrenia - 5/25 patient has become belligerent throwing trees at staff, kicking and punching staff.  States they are trying to kill her. -5/25 Place patient in restraints -5/25 Haldol 1 -2 mg PRN - 5/25 per psychiatry resume olanzapine and Depakote, if this does not work overnight will reconsult psychiatry in the a.m. as patient was very combative -5/29 today is the first day patient has been off of physical/chemical restraints without attacking the staff, seeing people in the room that were not there (hallucinations), accusing people of trying to hurt her, or tearing out her own lines and leads.  Acute encephalopathy - Resolved A/O x4 although cursing at staff pulling out  lines, throwing objects at staff. - 5/27 very pleasant today follows all commands  Hx stroke - 5/25 negative for acute stroke.  Sinus Tach -5/26 metoprolol IV 5 mg TID. Hold for HR<60 or SBP< 130: We will convert over to p.o. in a.m. - 5/26 lisinopril 5 mg daily -5/29 discontinue metoprolol IV -5/29 Metoprolol 12.5 mg BID  Essential HTN -Seen sinus tachycardia  UTI  positive E. Coli - Complete 5-day course antibiotics     DVT prophylaxis: Lovenox  Code Status: Full Family Communication:  Status is: Inpatient    Dispo: The patient is from: ???              Anticipated d/c is to: Psychiatry made some adjustments to patient's medications on 5/26.  Hopefully by the a.m. will be able to discontinue restraints in order to have a shot at placement.              Anticipated d/c date is: 5/28              Patient currently unstable      Consultants:  Psychiatry  Procedures/Significant Events:  5/25 CT head W0 contrast:No acute intracranial abnormality    I have personally reviewed and interpreted all radiology studies and my findings are as above.  VENTILATOR SETTINGS:    Cultures 5/24 urine positive E. Coli   Antimicrobials: Anti-infectives (From admission, onward)   Start     Ordered Stop   09/19/20 2200  cefTRIAXone (ROCEPHIN) 1 g in sodium chloride 0.9 %  100 mL IVPB        09/18/20 2338     09/18/20 2245  cefTRIAXone (ROCEPHIN) 1 g in sodium chloride 0.9 % 100 mL IVPB        09/18/20 2238 09/18/20 2358       Devices    LINES / TUBES:      Continuous Infusions: . sodium chloride    . cefTRIAXone (ROCEPHIN)  IV Stopped (09/22/20 2143)     Objective: Vitals:   09/22/20 2108 09/23/20 0410 09/23/20 0628 09/23/20 1328  BP: 135/74 140/71 129/69 108/76  Pulse:  67 67 60  Resp: 15 14 15 12   Temp: 97.7 F (36.5 C) 97.8 F (36.6 C)  98.3 F (36.8 C)  TempSrc: Oral Axillary  Oral  SpO2: 93% 93% 93% 93%  Weight:      Height:         Intake/Output Summary (Last 24 hours) at 09/23/2020 1835 Last data filed at 09/23/2020 1300 Gross per 24 hour  Intake 465 ml  Output 1700 ml  Net -1235 ml   Filed Weights   09/19/20 2300  Weight: 67.5 kg    Examination:  General: A/O x3 (does not know when), No acute respiratory distress, Eyes: negative scleral hemorrhage, negative anisocoria, negative icterus ENT: Negative Runny nose, negative gingival bleeding, Neck:  Negative scars, masses, torticollis, lymphadenopathy, JVD Lungs: Clear to auscultation bilaterally without wheezes or crackles Cardiovascular: Regular rate and rhythm without murmur gallop or rub normal S1 and S2 Abdomen: negative abdominal pain, nondistended, positive soft, bowel sounds, no rebound, no ascites, no appreciable mass Extremities: No significant cyanosis, clubbing, or edema bilateral lower extremities Skin: Negative rashes, lesions, ulcers Psychiatric:  Negative depression, negative anxiety, negative fatigue, negative mania  Central nervous system:  Cranial nerves II through XII intact, tongue/uvula midline, all extremities muscle strength 5/5, sensation intact throughout, negative dysarthria, negative expressive aphasia, negative receptive aphasia.  .     Data Reviewed: Care during the described time interval was provided by me .  I have reviewed this patient's available data, including medical history, events of note, physical examination, and all test results as part of my evaluation.  CBC: Recent Labs  Lab 09/18/20 1734 09/19/20 0248 09/20/20 0158 09/21/20 0106 09/22/20 0037 09/23/20 0017  WBC 9.6 7.6 6.7 7.8 7.3 9.0  NEUTROABS 4.8  --  4.1 4.0 2.5 4.2  HGB 14.6 12.6 14.3 14.1 13.9 14.3  HCT 48.4* 41.5 47.1* 46.2* 45.5 45.7  MCV 76.1* 75.7* 75.4* 75.4* 75.2* 74.3*  PLT 331 278 333 316 295 315   Basic Metabolic Panel: Recent Labs  Lab 09/19/20 0248 09/20/20 0158 09/21/20 0106 09/22/20 0037 09/23/20 0017  NA 139 139 138 137  136  K 3.9 4.4 4.1 4.3 4.9  CL 109 108 106 104 106  CO2 23 21* 22 26 23   GLUCOSE 113* 146* 116* 112* 94  BUN 11 9 12 19 12   CREATININE 0.76 0.73 0.93 0.93 0.67  CALCIUM 8.8* 10.7* 10.1 9.6 9.9  MG  --  2.0 2.0 1.9 2.1  PHOS  --  3.7 3.9 4.7* 3.4   GFR: Estimated Creatinine Clearance: 64.8 mL/min (by C-G formula based on SCr of 0.67 mg/dL). Liver Function Tests: Recent Labs  Lab 09/19/20 0248 09/20/20 0158 09/21/20 0106 09/22/20 0037 09/23/20 0017  AST 31 39 31 34 38  ALT 28 34 33 35 40  ALKPHOS 62 80 74 70 71  BILITOT 0.5 0.5 0.5 0.4 0.3  PROT 6.4* 8.4* 7.7  7.2 7.3  ALBUMIN 2.9* 3.8 3.5 3.1* 3.2*   No results for input(s): LIPASE, AMYLASE in the last 168 hours. Recent Labs  Lab 09/18/20 2338  AMMONIA 21   Coagulation Profile: No results for input(s): INR, PROTIME in the last 168 hours. Cardiac Enzymes: No results for input(s): CKTOTAL, CKMB, CKMBINDEX, TROPONINI in the last 168 hours. BNP (last 3 results) No results for input(s): PROBNP in the last 8760 hours. HbA1C: No results for input(s): HGBA1C in the last 72 hours. CBG: No results for input(s): GLUCAP in the last 168 hours. Lipid Profile: No results for input(s): CHOL, HDL, LDLCALC, TRIG, CHOLHDL, LDLDIRECT in the last 72 hours. Thyroid Function Tests: No results for input(s): TSH, T4TOTAL, FREET4, T3FREE, THYROIDAB in the last 72 hours. Anemia Panel: No results for input(s): VITAMINB12, FOLATE, FERRITIN, TIBC, IRON, RETICCTPCT in the last 72 hours. Sepsis Labs: No results for input(s): PROCALCITON, LATICACIDVEN in the last 168 hours.  Recent Results (from the past 240 hour(s))  Urine culture     Status: Abnormal   Collection Time: 09/18/20 10:42 PM   Specimen: Urine, Random  Result Value Ref Range Status   Specimen Description URINE, RANDOM  Final   Special Requests   Final    NONE Performed at [facility], [address], [place], [place] [address]    Culture >=100,000 COLONIES/mL  ESCHERICHIA COLI (A)  Final   Report Status 09/21/2020 FINAL  Final   Organism ID, Bacteria ESCHERICHIA COLI (A)  Final      Susceptibility   Escherichia coli - MIC*    AMPICILLIN 8 SENSITIVE Sensitive     CEFAZOLIN <=4 SENSITIVE Sensitive     CEFEPIME <=0.12 SENSITIVE Sensitive     CEFTRIAXONE <=0.25 SENSITIVE Sensitive     CIPROFLOXACIN >=4 RESISTANT Resistant     GENTAMICIN <=1 SENSITIVE Sensitive     IMIPENEM <=0.25 SENSITIVE Sensitive     NITROFURANTOIN 128 RESISTANT Resistant     TRIMETH/SULFA <=20 SENSITIVE Sensitive     AMPICILLIN/SULBACTAM <=2 SENSITIVE Sensitive     PIP/TAZO <=4 SENSITIVE Sensitive     * >=100,000 COLONIES/mL ESCHERICHIA COLI  Resp Panel by RT-PCR (Flu A&B, Covid) Nasopharyngeal Swab     Status: None   Collection Time: 09/18/20 11:44 PM   Specimen: Nasopharyngeal Swab; Nasopharyngeal(NP) swabs in vial transport medium  Result Value Ref Range Status   SARS Coronavirus 2 by RT PCR NEGATIVE NEGATIVE Final    Comment: (NOTE) SARS-CoV-2 target nucleic acids are NOT DETECTED.  The SARS-CoV-2 RNA is generally detectable in upper respiratory specimens during the acute phase of infection. The lowest concentration of SARS-CoV-2 viral copies this assay can detect is 138 copies/mL. A negative result does not preclude SARS-Cov-2 infection and should not be used as the sole basis for treatment or other patient management decisions. A negative result may occur with  improper specimen collection/handling, submission of specimen other than nasopharyngeal swab, presence of viral mutation(s) within the areas targeted by this assay, and inadequate number of viral copies(<138 copies/mL). A negative result must be combined with clinical observations, patient history, and epidemiological information. The expected result is Negative.  Fact Sheet for Patients:  EntrepreneurPulse.com.au  Fact Sheet for Healthcare Providers:   IncredibleEmployment.be  This test is no t yet approved or cleared by the [place] FDA and  has been authorized for detection and/or diagnosis of SARS-CoV-2 by FDA under an Emergency Use Authorization (EUA). This EUA will remain  in effect (meaning this test can be  used) for the duration of the COVID-19 declaration under Section 564(b)(1) of the Act, 21 U.S.C.section 360bbb-3(b)(1), unless the authorization is terminated  or revoked sooner.       Influenza A by PCR NEGATIVE NEGATIVE Final   Influenza B by PCR NEGATIVE NEGATIVE Final    Comment: (NOTE) The Xpert Xpress SARS-CoV-2/FLU/RSV plus assay is intended as an aid in the diagnosis of influenza from Nasopharyngeal swab specimens and should not be used as a sole basis for treatment. Nasal washings and aspirates are unacceptable for Xpert Xpress SARS-CoV-2/FLU/RSV testing.  Fact Sheet for Patients: EntrepreneurPulse.com.au  Fact Sheet for Healthcare Providers: IncredibleEmployment.be  This test is not yet approved or cleared by the [place] FDA and has been authorized for detection and/or diagnosis of SARS-CoV-2 by FDA under an Emergency Use Authorization (EUA). This EUA will remain in effect (meaning this test can be used) for the duration of the COVID-19 declaration under Section 564(b)(1) of the Act, 21 U.S.C. section 360bbb-3(b)(1), unless the authorization is terminated or revoked.  Performed at [facility], [place] [address], [place],  [address]          Radiology Studies: No results found.      Scheduled Meds: . divalproex  500 mg Oral Q12H  . enoxaparin (LOVENOX) injection  40 mg Subcutaneous QHS  . lisinopril  5 mg Oral Daily  . metoprolol tartrate  5 mg Intravenous Q8H  . OLANZapine zydis  10 mg Oral QHS  . valbenazine  40 mg Oral QHS   Continuous Infusions: . sodium chloride    . cefTRIAXone (ROCEPHIN)  IV Stopped  (09/22/20 2143)     LOS: 4 days    Time spent:40 min    Aalliyah Kilker, Geraldo Docker, MD [facility]   If 7PM-7AM, please contact night-coverage 09/23/2020, 6:35 PM

## 2020-09-24 DIAGNOSIS — G9340 Encephalopathy, unspecified: Secondary | ICD-10-CM | POA: Diagnosis not present

## 2020-09-24 DIAGNOSIS — R410 Disorientation, unspecified: Secondary | ICD-10-CM | POA: Diagnosis not present

## 2020-09-24 DIAGNOSIS — F200 Paranoid schizophrenia: Secondary | ICD-10-CM | POA: Diagnosis not present

## 2020-09-24 DIAGNOSIS — N390 Urinary tract infection, site not specified: Secondary | ICD-10-CM | POA: Diagnosis not present

## 2020-09-24 LAB — COMPREHENSIVE METABOLIC PANEL
ALT: 38 U/L (ref 0–44)
AST: 36 U/L (ref 15–41)
Albumin: 3 g/dL — ABNORMAL LOW (ref 3.5–5.0)
Alkaline Phosphatase: 68 U/L (ref 38–126)
Anion gap: 10 (ref 5–15)
BUN: 8 mg/dL (ref 8–23)
CO2: 20 mmol/L — ABNORMAL LOW (ref 22–32)
Calcium: 9.2 mg/dL (ref 8.9–10.3)
Chloride: 105 mmol/L (ref 98–111)
Creatinine, Ser: 0.64 mg/dL (ref 0.44–1.00)
GFR, Estimated: >60 mL/min (ref >60)
Glucose, Bld: 98 mg/dL (ref 70–99)
Potassium: 4.3 mmol/L (ref 3.5–5.1)
Sodium: 135 mmol/L (ref 135–145)
Total Bilirubin: 0.3 mg/dL (ref 0.3–1.2)
Total Protein: 6.9 g/dL (ref 6.5–8.1)

## 2020-09-24 LAB — CBC WITH DIFFERENTIAL/PLATELET
Abs Immature Granulocytes: 0.02 10*3/uL (ref 0.00–0.07)
Basophils Absolute: 0 10*3/uL (ref 0.0–0.1)
Basophils Relative: 1 %
Eosinophils Absolute: 0.3 10*3/uL (ref 0.0–0.5)
Eosinophils Relative: 5 %
HCT: 44.4 % (ref 36.0–46.0)
Hemoglobin: 13.5 g/dL (ref 12.0–15.0)
Immature Granulocytes: 0 %
Lymphocytes Relative: 63 %
Lymphs Abs: 3.4 10*3/uL (ref 0.7–4.0)
MCH: 22.8 pg — ABNORMAL LOW (ref 26.0–34.0)
MCHC: 30.4 g/dL (ref 30.0–36.0)
MCV: 75 fL — ABNORMAL LOW (ref 80.0–100.0)
Monocytes Absolute: 0.5 10*3/uL (ref 0.1–1.0)
Monocytes Relative: 9 %
Neutro Abs: 1.2 10*3/uL — ABNORMAL LOW (ref 1.7–7.7)
Neutrophils Relative %: 22 %
Platelets: 313 10*3/uL (ref 150–400)
RBC: 5.92 MIL/uL — ABNORMAL HIGH (ref 3.87–5.11)
RDW: 15.1 % (ref 11.5–15.5)
WBC: 5.4 10*3/uL (ref 4.0–10.5)
nRBC: 0 % (ref 0.0–0.2)

## 2020-09-24 LAB — PHOSPHORUS: Phosphorus: 3.5 mg/dL (ref 2.5–4.6)

## 2020-09-24 LAB — MAGNESIUM: Magnesium: 2 mg/dL (ref 1.7–2.4)

## 2020-09-24 MED ORDER — TRAZODONE HCL 50 MG PO TABS
25.0000 mg | ORAL_TABLET | Freq: Every evening | ORAL | Status: DC | PRN
  Administered 2020-09-24: 25 mg via ORAL
  Filled 2020-09-24: qty 1

## 2020-09-24 NOTE — Progress Notes (Signed)
PROGRESS NOTE    Karen Best  QHU:[id number] DOB: Sep 19, 1955 DOA: 09/18/2020 PCP: [facility]     Brief Narrative:  Karen Best is a 65 y.o. BF PMHx bipolar 1 DO, schizophrenia, Hx of substance abuse, f Hx stroke in June 2021,   Brought to the ER after patient was found to be confused.  Does not know exactly when patient got confused and who found the patient and how patient was brought to the ER.  Patient has still does not remember who called EMS.  ED Course: In the ER patient is not agitated but appears confused but oriented to her name and place.  Patient is afebrile and UA is concerning for UTI.  No family contacts available in the chart.  At this time patient was started on ceftriaxone and admitted for further observation for acute encephalopathy secondary UTI.  CT head is pending.  COVID test is negative.   Subjective: 5/30 afebrile overnight A/O x3 (does not know when).  Today is the first day patient opened up about where she resided.  States resides in a boarding home.  States another resident named Jackson Latino helps her with her medication.  Would not answer any further questions concerning what had happened to her previous medication.   Assessment & Plan: Covid vaccination; no vaccination does not want vaccination   Principal Problem:   Acute encephalopathy Active Problems:   Acute UTI (urinary tract infection)   Schizoaffective disorder, bipolar type ([place])   Paranoid schizophrenia - 5/25 patient has become belligerent throwing trees at staff, kicking and punching staff.  States they are trying to kill her. -5/25 Place patient in restraints -5/25 Haldol 1 -2 mg PRN - 5/25 per psychiatry resume olanzapine and Depakote, if this does not work overnight will reconsult psychiatry in the a.m. as patient was very combative -5/29 today is the first day patient has been off of physical/chemical restraints without attacking the staff, seeing  people in the room that were not there (hallucinations), accusing people of trying to hurt her, or tearing out her own lines and leads. -5/30 patient calm today answering some questions not threatening staff, not hallucinating  Acute encephalopathy - Resolved A/O x4 although cursing at staff pulling out lines, throwing objects at staff. - 5/27 very pleasant today follows all commands  Hx stroke - 5/25 negative for acute stroke.  Sinus Tach -5/26 metoprolol IV 5 mg TID. Hold for HR<60 or SBP< 130: We will convert over to p.o. in a.m. - 5/26 lisinopril 5 mg daily -5/29 discontinue metoprolol IV -5/29 Metoprolol 12.5 mg BID  Essential HTN -Seen sinus tachycardia  UTI  positive E. Coli - Complete 5-day course antibiotics     DVT prophylaxis: Lovenox  Code Status: Full Family Communication:  Status is: Inpatient    Dispo: The patient is from: ???              Anticipated d/c is to: 5/30 restraints have been off for 24 hours              Anticipated d/c date is: 5/28              Patient currently unstable      Consultants:  Psychiatry  Procedures/Significant Events:  5/25 CT head W0 contrast:No acute intracranial abnormality    I have personally reviewed and interpreted all radiology studies and my findings are as above.  VENTILATOR SETTINGS:    Cultures 5/24 urine positive E. Coli  Antimicrobials: Anti-infectives (From admission, onward)   Start     Ordered Stop   09/19/20 2200  cefTRIAXone (ROCEPHIN) 1 g in sodium chloride 0.9 % 100 mL IVPB        09/18/20 2338     09/18/20 2245  cefTRIAXone (ROCEPHIN) 1 g in sodium chloride 0.9 % 100 mL IVPB        09/18/20 2238 09/18/20 2358       Devices    LINES / TUBES:      Continuous Infusions: . sodium chloride Stopped (09/23/20 2243)     Objective: Vitals:   09/23/20 2039 09/23/20 2236 09/24/20 0443 09/24/20 1235  BP: (!) 146/70 132/68 118/73 127/80  Pulse: (!) 59  60 (!) 58  Resp: 16 16 15  10   Temp: 97.8 F (36.6 C)  (!) 97.5 F (36.4 C) 97.7 F (36.5 C)  TempSrc: Oral  Oral Oral  SpO2: 97% 94% 96% 97%  Weight:      Height:        Intake/Output Summary (Last 24 hours) at 09/24/2020 1552 Last data filed at 09/24/2020 1007 Gross per 24 hour  Intake 1561.81 ml  Output 1200 ml  Net 361.81 ml   Filed Weights   09/19/20 2300  Weight: 67.5 kg    Examination:  General: A/O x3 (does not know when), No acute respiratory distress, Eyes: negative scleral hemorrhage, negative anisocoria, negative icterus ENT: Negative Runny nose, negative gingival bleeding, Neck:  Negative scars, masses, torticollis, lymphadenopathy, JVD Lungs: Clear to auscultation bilaterally without wheezes or crackles Cardiovascular: Regular rate and rhythm without murmur gallop or rub normal S1 and S2 Abdomen: negative abdominal pain, nondistended, positive soft, bowel sounds, no rebound, no ascites, no appreciable mass Extremities: No significant cyanosis, clubbing, or edema bilateral lower extremities Skin: Negative rashes, lesions, ulcers Psychiatric:  Negative depression, negative anxiety, negative fatigue, negative mania  Central nervous system:  Cranial nerves II through XII intact, tongue/uvula midline, all extremities muscle strength 5/5, sensation intact throughout, negative dysarthria, negative expressive aphasia, negative receptive aphasia.  .     Data Reviewed: Care during the described time interval was provided by me .  I have reviewed this patient's available data, including medical history, events of note, physical examination, and all test results as part of my evaluation.  CBC: Recent Labs  Lab 09/20/20 0158 09/21/20 0106 09/22/20 0037 09/23/20 0017 09/24/20 0024  WBC 6.7 7.8 7.3 9.0 5.4  NEUTROABS 4.1 4.0 2.5 4.2 1.2*  HGB 14.3 14.1 13.9 14.3 13.5  HCT 47.1* 46.2* 45.5 45.7 44.4  MCV 75.4* 75.4* 75.2* 74.3* 75.0*  PLT 333 316 295 302 409   Basic Metabolic  Panel: Recent Labs  Lab 09/20/20 0158 09/21/20 0106 09/22/20 0037 09/23/20 0017 09/24/20 0024  NA 139 138 137 136 135  K 4.4 4.1 4.3 4.9 4.3  CL 108 106 104 106 105  CO2 21* 22 26 23  20*  GLUCOSE 146* 116* 112* 94 98  BUN 9 12 19 12 8   CREATININE 0.73 0.93 0.93 0.67 0.64  CALCIUM 10.7* 10.1 9.6 9.9 9.2  MG 2.0 2.0 1.9 2.1 2.0  PHOS 3.7 3.9 4.7* 3.4 3.5   GFR: Estimated Creatinine Clearance: 64.8 mL/min (by C-G formula based on SCr of 0.64 mg/dL). Liver Function Tests: Recent Labs  Lab 09/20/20 0158 09/21/20 0106 09/22/20 0037 09/23/20 0017 09/24/20 0024  AST 39 31 34 38 36  ALT 34 33 35 40 38  ALKPHOS 80 74 70 71 68  BILITOT  0.5 0.5 0.4 0.3 0.3  PROT 8.4* 7.7 7.2 7.3 6.9  ALBUMIN 3.8 3.5 3.1* 3.2* 3.0*   No results for input(s): LIPASE, AMYLASE in the last 168 hours. Recent Labs  Lab 09/18/20 2338  AMMONIA 21   Coagulation Profile: No results for input(s): INR, PROTIME in the last 168 hours. Cardiac Enzymes: No results for input(s): CKTOTAL, CKMB, CKMBINDEX, TROPONINI in the last 168 hours. BNP (last 3 results) No results for input(s): PROBNP in the last 8760 hours. HbA1C: No results for input(s): HGBA1C in the last 72 hours. CBG: No results for input(s): GLUCAP in the last 168 hours. Lipid Profile: No results for input(s): CHOL, HDL, LDLCALC, TRIG, CHOLHDL, LDLDIRECT in the last 72 hours. Thyroid Function Tests: No results for input(s): TSH, T4TOTAL, FREET4, T3FREE, THYROIDAB in the last 72 hours. Anemia Panel: No results for input(s): VITAMINB12, FOLATE, FERRITIN, TIBC, IRON, RETICCTPCT in the last 72 hours. Sepsis Labs: No results for input(s): PROCALCITON, LATICACIDVEN in the last 168 hours.  Recent Results (from the past 240 hour(s))  Urine culture     Status: Abnormal   Collection Time: 09/18/20 10:42 PM   Specimen: Urine, Random  Result Value Ref Range Status   Specimen Description URINE, RANDOM  Final   Special Requests   Final     NONE Performed at [facility], [address], [place], [place] [address]    Culture >=100,000 COLONIES/mL ESCHERICHIA COLI (A)  Final   Report Status 09/21/2020 FINAL  Final   Organism ID, Bacteria ESCHERICHIA COLI (A)  Final      Susceptibility   Escherichia coli - MIC*    AMPICILLIN 8 SENSITIVE Sensitive     CEFAZOLIN <=4 SENSITIVE Sensitive     CEFEPIME <=0.12 SENSITIVE Sensitive     CEFTRIAXONE <=0.25 SENSITIVE Sensitive     CIPROFLOXACIN >=4 RESISTANT Resistant     GENTAMICIN <=1 SENSITIVE Sensitive     IMIPENEM <=0.25 SENSITIVE Sensitive     NITROFURANTOIN 128 RESISTANT Resistant     TRIMETH/SULFA <=20 SENSITIVE Sensitive     AMPICILLIN/SULBACTAM <=2 SENSITIVE Sensitive     PIP/TAZO <=4 SENSITIVE Sensitive     * >=100,000 COLONIES/mL ESCHERICHIA COLI  Resp Panel by RT-PCR (Flu A&B, Covid) Nasopharyngeal Swab     Status: None   Collection Time: 09/18/20 11:44 PM   Specimen: Nasopharyngeal Swab; Nasopharyngeal(NP) swabs in vial transport medium  Result Value Ref Range Status   SARS Coronavirus 2 by RT PCR NEGATIVE NEGATIVE Final    Comment: (NOTE) SARS-CoV-2 target nucleic acids are NOT DETECTED.  The SARS-CoV-2 RNA is generally detectable in upper respiratory specimens during the acute phase of infection. The lowest concentration of SARS-CoV-2 viral copies this assay can detect is 138 copies/mL. A negative result does not preclude SARS-Cov-2 infection and should not be used as the sole basis for treatment or other patient management decisions. A negative result may occur with  improper specimen collection/handling, submission of specimen other than nasopharyngeal swab, presence of viral mutation(s) within the areas targeted by this assay, and inadequate number of viral copies(<138 copies/mL). A negative result must be combined with clinical observations, patient history, and epidemiological information. The expected result is Negative.  Fact Sheet for Patients:   EntrepreneurPulse.com.au  Fact Sheet for Healthcare Providers:  IncredibleEmployment.be  This test is no t yet approved or cleared by the [place] FDA and  has been authorized for detection and/or diagnosis of SARS-CoV-2 by FDA under an Emergency Use Authorization (EUA). This EUA  will remain  in effect (meaning this test can be used) for the duration of the COVID-19 declaration under Section 564(b)(1) of the Act, 21 U.S.C.section 360bbb-3(b)(1), unless the authorization is terminated  or revoked sooner.       Influenza A by PCR NEGATIVE NEGATIVE Final   Influenza B by PCR NEGATIVE NEGATIVE Final    Comment: (NOTE) The Xpert Xpress SARS-CoV-2/FLU/RSV plus assay is intended as an aid in the diagnosis of influenza from Nasopharyngeal swab specimens and should not be used as a sole basis for treatment. Nasal washings and aspirates are unacceptable for Xpert Xpress SARS-CoV-2/FLU/RSV testing.  Fact Sheet for Patients: EntrepreneurPulse.com.au  Fact Sheet for Healthcare Providers: IncredibleEmployment.be  This test is not yet approved or cleared by the [place] FDA and has been authorized for detection and/or diagnosis of SARS-CoV-2 by FDA under an Emergency Use Authorization (EUA). This EUA will remain in effect (meaning this test can be used) for the duration of the COVID-19 declaration under Section 564(b)(1) of the Act, 21 U.S.C. section 360bbb-3(b)(1), unless the authorization is terminated or revoked.  Performed at [facility], [place] [address], [place], [place] [address]          Radiology Studies: No results found.      Scheduled Meds: . divalproex  500 mg Oral Q12H  . enoxaparin (LOVENOX) injection  40 mg Subcutaneous QHS  . lisinopril  5 mg Oral Daily  . metoprolol tartrate  12.5 mg Oral BID  . OLANZapine zydis  10 mg Oral QHS  . valbenazine  40 mg Oral QHS    Continuous Infusions: . sodium chloride Stopped (09/23/20 2243)     LOS: 5 days    Time spent:40 min    Blessen Kimbrough, Geraldo Docker, MD [facility]   If 7PM-7AM, please contact night-coverage 09/24/2020, 3:52 PM

## 2020-09-24 NOTE — Consult Note (Signed)
Reason for Consult: Belligerent and AMS   HPI: Patient was very calm and pleasant today during interview.  She reports no SI, HI, or AVH. She reports that her sleep and appetite are improved. She asks when she will be able to be discharged. She is Alert and Oriented to person, place, and time. She has no other concerns at present.   Plan: Case discussed with Dr. Serafina Mitchell.  She appears to be clearing and is not aggressive today. Would recommend continuing her current medications and ensuring Social Work arranges outpatient Psychiatry follow up.   -Continue Zyprexa 10 mg BID -Continue Depakote 500 mg BID -Continue Ingrezza 40 mg QHS   Psychiatry will now sign off on this patient. Thank you for allowing [place] to take part of this patients care. If further questions arise please reach out.   Fatima Sanger MD Resident

## 2020-09-25 ENCOUNTER — Other Ambulatory Visit (HOSPITAL_COMMUNITY): Payer: Self-pay

## 2020-09-25 DIAGNOSIS — R410 Disorientation, unspecified: Secondary | ICD-10-CM | POA: Diagnosis not present

## 2020-09-25 DIAGNOSIS — G9340 Encephalopathy, unspecified: Secondary | ICD-10-CM | POA: Diagnosis not present

## 2020-09-25 DIAGNOSIS — F250 Schizoaffective disorder, bipolar type: Secondary | ICD-10-CM | POA: Diagnosis not present

## 2020-09-25 DIAGNOSIS — N390 Urinary tract infection, site not specified: Secondary | ICD-10-CM | POA: Diagnosis not present

## 2020-09-25 LAB — CBC WITH DIFFERENTIAL/PLATELET
Abs Immature Granulocytes: 0.02 10*3/uL (ref 0.00–0.07)
Basophils Absolute: 0 10*3/uL (ref 0.0–0.1)
Basophils Relative: 0 %
Eosinophils Absolute: 0.3 10*3/uL (ref 0.0–0.5)
Eosinophils Relative: 6 %
HCT: 44.1 % (ref 36.0–46.0)
Hemoglobin: 13.3 g/dL (ref 12.0–15.0)
Immature Granulocytes: 0 %
Lymphocytes Relative: 68 %
Lymphs Abs: 4 10*3/uL (ref 0.7–4.0)
MCH: 22.7 pg — ABNORMAL LOW (ref 26.0–34.0)
MCHC: 30.2 g/dL (ref 30.0–36.0)
MCV: 75.4 fL — ABNORMAL LOW (ref 80.0–100.0)
Monocytes Absolute: 0.5 10*3/uL (ref 0.1–1.0)
Monocytes Relative: 8 %
Neutro Abs: 1.1 10*3/uL — ABNORMAL LOW (ref 1.7–7.7)
Neutrophils Relative %: 18 %
Platelets: 312 10*3/uL (ref 150–400)
RBC: 5.85 MIL/uL — ABNORMAL HIGH (ref 3.87–5.11)
RDW: 14.6 % (ref 11.5–15.5)
WBC: 5.9 10*3/uL (ref 4.0–10.5)
nRBC: 0 % (ref 0.0–0.2)

## 2020-09-25 LAB — MAGNESIUM: Magnesium: 2 mg/dL (ref 1.7–2.4)

## 2020-09-25 LAB — PHOSPHORUS: Phosphorus: 2.8 mg/dL (ref 2.5–4.6)

## 2020-09-25 MED ORDER — TRAZODONE HCL 50 MG PO TABS
25.0000 mg | ORAL_TABLET | Freq: Every evening | ORAL | 0 refills | Status: DC | PRN
  Filled 2020-09-25: qty 30, 60d supply, fill #0

## 2020-09-25 MED ORDER — METOPROLOL TARTRATE 25 MG PO TABS
12.5000 mg | ORAL_TABLET | Freq: Two times a day (BID) | ORAL | 0 refills | Status: DC
  Filled 2020-09-25: qty 30, 30d supply, fill #0

## 2020-09-25 MED ORDER — LISINOPRIL 5 MG PO TABS
5.0000 mg | ORAL_TABLET | Freq: Every day | ORAL | 0 refills | Status: DC
  Filled 2020-09-25: qty 30, 30d supply, fill #0

## 2020-09-25 NOTE — Discharge Summary (Signed)
Physician Discharge Summary  Karen Best TKW:[id number] DOB: [birth year] DOA: 09/18/2020  PCP: [facility]  Admit date: 09/18/2020 Discharge date: 09/25/2020  Time spent:35 minutes  Recommendations for Outpatient Follow-up:  Covid vaccination; no vaccination does not want vaccination   Paranoid schizophrenia - 5/25 patient has become belligerent throwing trees at staff, kicking and punching staff.  States they are trying to kill her. -5/25 Place patient in restraints -5/25 Haldol 1 -2 mg PRN - 5/25 per psychiatry resume olanzapine and Depakote, if this does not work overnight will reconsult psychiatry in the a.m. as patient was very combative -5/29 today is the first day patient has been off of physical/chemical restraints without attacking the staff, seeing people in the room that were not there (hallucinations), accusing people of trying to hurt her, or tearing out her own lines and leads. -5/30 patient calm today answering some questions not threatening staff, not hallucinating - Patient to follow-up with outpatient psychiatry appointments [facility], patient to schedule follow-up ASAP  Acute encephalopathy - Resolved A/O x4 although cursing at staff pulling out lines, throwing objects at staff. - 5/27 very pleasant today follows all commands  Hx stroke - 5/25 negative for acute stroke.  Sinus Tach -5/26 metoprolol IV 5 mg TID. Hold for HR<60 or SBP< 130: We will convert over to p.o. in a.m. - 5/26 lisinopril 5 mg daily -5/29 discontinue metoprolol IV -5/29 Metoprolol 12.5 mg BID  Essential HTN -Seen sinus tachycardia  UTI  positive E. Coli - Completed 5-day course antibiotics   Discharge Diagnoses:  Principal Problem:   Acute encephalopathy Active Problems:   Acute UTI (urinary tract infection)   Schizoaffective disorder, bipolar type [facility])   Discharge Condition: Stable  Diet recommendation: Heart healthy  Filed Weights    09/19/20 2300  Weight: 67.5 kg    History of present illness:  Karen Best a 65 y.o.BF PMHx bipolar 1 DO, schizophrenia, Hx of substance abuse, f Hx stroke in June 2021,   Brought to the ER after patient was found to be confused. Does not know exactly when patient got confused and who found the patient and how patient was brought to the ER. Patient has still does not remember who called EMS.  ED Course:In the ER patient is not agitated but appears confused but oriented to her name and place. Patient is afebrile and UA is concerning for UTI. No family contacts available in the chart. At this time patient was started on ceftriaxone and admitted for further observation for acute encephalopathy secondary UTI. CT head is pending. COVID test is negative.  Hospital Course:  See above  Procedures: 5/25 CT head W0 contrast:No acute intracranial abnormality  Consultations: Psychiatry  Cultures  5/24 urine positive E. Coli  Antibiotics Anti-infectives (From admission, onward)   Start     Ordered Stop   09/19/20 2200  cefTRIAXone (ROCEPHIN) 1 g in sodium chloride 0.9 % 100 mL IVPB        09/18/20 2338 09/23/20 2318   09/18/20 2245  cefTRIAXone (ROCEPHIN) 1 g in sodium chloride 0.9 % 100 mL IVPB        09/18/20 2238 09/18/20 2358       Discharge Exam: Vitals:   09/24/20 1907 09/24/20 2105 09/25/20 0441 09/25/20 0804  BP:  125/77 (!) 101/57 117/66  Pulse: (!) 58 (!) 59 60 65  Resp: 18 16 15    Temp:  98.5 F (36.9 C) 98.2 F (36.8 C)   TempSrc:  Oral Oral   SpO2: 94% 96% 97%   Weight:      Height:        General: A/O x3 (does not know when), No acute respiratory distress, Eyes: negative scleral hemorrhage, negative anisocoria, negative icterus ENT: Negative Runny nose, negative gingival bleeding, Neck:  Negative scars, masses, torticollis, lymphadenopathy, JVD Lungs: Clear to auscultation bilaterally without wheezes or crackles Cardiovascular: Regular rate  and rhythm without murmur gallop or rub normal S1 and S2   Discharge Instructions   Allergies as of 09/25/2020   No Known Allergies     Medication List    STOP taking these medications   aspirin 81 MG chewable tablet   atorvastatin 40 MG tablet Commonly known as: Lipitor   famotidine 40 MG tablet Commonly known as: PEPCID   ibuprofen 200 MG tablet Commonly known as: ADVIL     TAKE these medications   clonazePAM 0.5 MG tablet Commonly known as: KLONOPIN Take 0.5 mg by mouth 2 (two) times daily as needed for anxiety.   divalproex 500 MG DR tablet Commonly known as: DEPAKOTE Take 1 tablet (500 mg total) by mouth every 12 (twelve) hours.   Ingrezza 80 MG capsule Generic drug: valbenazine Take 40 mg by mouth at bedtime.   lisinopril 5 MG tablet Commonly known as: ZESTRIL Take 1 tablet (5 mg total) by mouth daily. Start taking on: September 26, 2020   metoprolol tartrate 25 MG tablet Commonly known as: LOPRESSOR Take 0.5 tablets (12.5 mg total) by mouth 2 (two) times daily.   nystatin cream Commonly known as: MYCOSTATIN APPLY TO AFFECTED AREA TWICE A DAY FOR 7 DAYS   OLANZapine zydis 10 MG disintegrating tablet Commonly known as: ZYPREXA Take 1 tablet (10 mg total) by mouth at bedtime.   QUEtiapine 100 MG tablet Commonly known as: SEROQUEL Take 100-300 mg by mouth as directed. Take 1 tablet (100 mg) and Take 3 tablets (300 mg) at bedtime   traZODone 50 MG tablet Commonly known as: DESYREL Take 0.5 tablets (25 mg total) by mouth at bedtime as needed for sleep.      No Known Allergies  Follow-up Information    [facility]. Schedule an appointment as soon as possible for a visit.   Why: Follow up for behavioral health services and medication management.  Contact information: [phone] [address]  [address] [place] [address]               The results of significant diagnostics from this hospitalization (including imaging, microbiology,  ancillary and laboratory) are listed below for reference.    Significant Diagnostic Studies: CT HEAD WO CONTRAST  Result Date: 09/19/2020 CLINICAL DATA:  Mental status change. EXAM: CT HEAD WITHOUT CONTRAST TECHNIQUE: Contiguous axial images were obtained from the base of the skull through the vertex without intravenous contrast. COMPARISON:  CT head 05/12/2020 FINDINGS: Brain: Patchy and confluent areas of decreased attenuation are noted throughout the deep and periventricular white matter of the cerebral hemispheres bilaterally, compatible with chronic microvascular ischemic disease. No evidence of large-territorial acute infarction. No parenchymal hemorrhage. No mass lesion. No extra-axial collection. No mass effect or midline shift. No hydrocephalus. Basilar cisterns are patent. Vascular: No hyperdense vessel. Skull: No acute fracture or focal lesion. Sinuses/Orbits: Paranasal sinuses and mastoid air cells are clear. The orbits are unremarkable. Other: None. IMPRESSION: No acute intracranial abnormality. Electronically Signed   By: Iven Finn M.D.   On: 09/19/2020 00:45    Microbiology: Recent Results (from the  past 240 hour(s))  Urine culture     Status: Abnormal   Collection Time: 09/18/20 10:42 PM   Specimen: Urine, Random  Result Value Ref Range Status   Specimen Description URINE, RANDOM  Final   Special Requests   Final    NONE Performed at [facility], [address], [place], [place] [address]    Culture >=100,000 COLONIES/mL ESCHERICHIA COLI (A)  Final   Report Status 09/21/2020 FINAL  Final   Organism ID, Bacteria ESCHERICHIA COLI (A)  Final      Susceptibility   Escherichia coli - MIC*    AMPICILLIN 8 SENSITIVE Sensitive     CEFAZOLIN <=4 SENSITIVE Sensitive     CEFEPIME <=0.12 SENSITIVE Sensitive     CEFTRIAXONE <=0.25 SENSITIVE Sensitive     CIPROFLOXACIN >=4 RESISTANT Resistant     GENTAMICIN <=1 SENSITIVE Sensitive     IMIPENEM <=0.25 SENSITIVE Sensitive      NITROFURANTOIN 128 RESISTANT Resistant     TRIMETH/SULFA <=20 SENSITIVE Sensitive     AMPICILLIN/SULBACTAM <=2 SENSITIVE Sensitive     PIP/TAZO <=4 SENSITIVE Sensitive     * >=100,000 COLONIES/mL ESCHERICHIA COLI  Resp Panel by RT-PCR (Flu A&B, Covid) Nasopharyngeal Swab     Status: None   Collection Time: 09/18/20 11:44 PM   Specimen: Nasopharyngeal Swab; Nasopharyngeal(NP) swabs in vial transport medium  Result Value Ref Range Status   SARS Coronavirus 2 by RT PCR NEGATIVE NEGATIVE Final    Comment: (NOTE) SARS-CoV-2 target nucleic acids are NOT DETECTED.  The SARS-CoV-2 RNA is generally detectable in upper respiratory specimens during the acute phase of infection. The lowest concentration of SARS-CoV-2 viral copies this assay can detect is 138 copies/mL. A negative result does not preclude SARS-Cov-2 infection and should not be used as the sole basis for treatment or other patient management decisions. A negative result may occur with  improper specimen collection/handling, submission of specimen other than nasopharyngeal swab, presence of viral mutation(s) within the areas targeted by this assay, and inadequate number of viral copies(<138 copies/mL). A negative result must be combined with clinical observations, patient history, and epidemiological information. The expected result is Negative.  Fact Sheet for Patients:  EntrepreneurPulse.com.au  Fact Sheet for Healthcare Providers:  IncredibleEmployment.be  This test is no t yet approved or cleared by the [place] FDA and  has been authorized for detection and/or diagnosis of SARS-CoV-2 by FDA under an Emergency Use Authorization (EUA). This EUA will remain  in effect (meaning this test can be used) for the duration of the COVID-19 declaration under Section 564(b)(1) of the Act, 21 U.S.C.section 360bbb-3(b)(1), unless the authorization is terminated  or revoked sooner.        Influenza A by PCR NEGATIVE NEGATIVE Final   Influenza B by PCR NEGATIVE NEGATIVE Final    Comment: (NOTE) The Xpert Xpress SARS-CoV-2/FLU/RSV plus assay is intended as an aid in the diagnosis of influenza from Nasopharyngeal swab specimens and should not be used as a sole basis for treatment. Nasal washings and aspirates are unacceptable for Xpert Xpress SARS-CoV-2/FLU/RSV testing.  Fact Sheet for Patients: EntrepreneurPulse.com.au  Fact Sheet for Healthcare Providers: IncredibleEmployment.be  This test is not yet approved or cleared by the [place] FDA and has been authorized for detection and/or diagnosis of SARS-CoV-2 by FDA under an Emergency Use Authorization (EUA). This EUA will remain in effect (meaning this test can be used) for the duration of the COVID-19 declaration under Section 564(b)(1) of the Act, 21  U.S.C. section 360bbb-3(b)(1), unless the authorization is terminated or revoked.  Performed at [facility], [place] [address] [place], [place] [address]      Labs: Basic Metabolic Panel: Recent Labs  Lab 09/20/20 0158 09/21/20 0106 09/22/20 0037 09/23/20 0017 09/24/20 0024 09/25/20 0038  NA 139 138 137 136 135  --   K 4.4 4.1 4.3 4.9 4.3  --   CL 108 106 104 106 105  --   CO2 21* 22 26 23  20*  --   GLUCOSE 146* 116* 112* 94 98  --   BUN 9 12 19 12 8   --   CREATININE 0.73 0.93 0.93 0.67 0.64  --   CALCIUM 10.7* 10.1 9.6 9.9 9.2  --   MG 2.0 2.0 1.9 2.1 2.0 2.0  PHOS 3.7 3.9 4.7* 3.4 3.5 2.8   Liver Function Tests: Recent Labs  Lab 09/20/20 0158 09/21/20 0106 09/22/20 0037 09/23/20 0017 09/24/20 0024  AST 39 31 34 38 36  ALT 34 33 35 40 38  ALKPHOS 80 74 70 71 68  BILITOT 0.5 0.5 0.4 0.3 0.3  PROT 8.4* 7.7 7.2 7.3 6.9  ALBUMIN 3.8 3.5 3.1* 3.2* 3.0*   No results for input(s): LIPASE, AMYLASE in the last 168 hours. Recent Labs  Lab 09/18/20 2338  AMMONIA 21   CBC: Recent Labs  Lab  09/21/20 0106 09/22/20 0037 09/23/20 0017 09/24/20 0024 09/25/20 0038  WBC 7.8 7.3 9.0 5.4 5.9  NEUTROABS 4.0 2.5 4.2 1.2* 1.1*  HGB 14.1 13.9 14.3 13.5 13.3  HCT 46.2* 45.5 45.7 44.4 44.1  MCV 75.4* 75.2* 74.3* 75.0* 75.4*  PLT 316 295 302 313 312   Cardiac Enzymes: No results for input(s): CKTOTAL, CKMB, CKMBINDEX, TROPONINI in the last 168 hours. BNP: BNP (last 3 results) No results for input(s): BNP in the last 8760 hours.  ProBNP (last 3 results) No results for input(s): PROBNP in the last 8760 hours.  CBG: No results for input(s): GLUCAP in the last 168 hours.     Signed:  Dia Crawford, MD [facility]

## 2020-09-25 NOTE — TOC Initial Note (Addendum)
Transition of Care [facility]) - Initial/Assessment Note    Patient Details  Name: Karen Best MRN: [id number] Date of Birth: [birth year]  [facility]) CM/SW Contact:    Benard Halsted, LCSW Phone Number: 09/25/2020, 9:56 AM  Clinical Narrative:                 CSW left voicemail for patient's landlord, Vicente Serene, regarding patient's discharge today to confirm address and transportation. CSW also contacted other contact on Suezanne Jacquet [phone]). She reported she used to provide ACTT services to patient over a year ago when she went to SNF [facility]?) but has not had contact since.  CSW spoke with patient alongside nursing team. Patient confirmed she lives in a boarding house with "Rolan Bucco" and others. She refused to walk when prompted by RN. CSW awaiting call back from landlord to determine patient's baseline, as patient has had strength to be combative with staff.   2:41pm-CSW received return call from [facility] on patient's Facesheet [phone]). She reported that she was patient's RN on the [employer], which stopped for patient one year ago when she entered into [facility]. Prior to that, they had patient in an independent apartment. 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    CSW spoke with [facility] admissions. They reported that patient refused to pay her bill and refused to submit paperwork so that the facility could get the money from Medicaid directly every month. Therefore, patient is unable to return. CSW notes that patient would have to sign her Medicaid check over to any SNF, so this is a barrier to placement.   5:07pm-CSW still has not received call back from patient's landlord and cannot confirm home support at this time. Will continue attempts tomorrow.   Expected Discharge Plan: Home/Self Care Barriers to Discharge: Barriers Resolved   Patient Goals and CMS Choice        Expected Discharge Plan and  Services Expected Discharge Plan: Home/Self Care In-house Referral: Clinical Social Work   Post Acute Care Choice: NA Living arrangements for the past 2 months: [place] Expected Discharge Date: 09/25/20                                    Prior Living Arrangements/Services Living arrangements for the past 2 months: [place] with:: Self Patient language and need for interpreter reviewed:: Yes Do you feel safe going back to the place where you live?: Yes      Need for Family Participation in Patient Care: Yes (Comment) Care giver support system in place?: No (comment)   Criminal Activity/Legal Involvement Pertinent to Current Situation/Hospitalization: No - Comment as needed  Activities of Daily Living Home Assistive Devices/Equipment: None ADL Screening (condition at time of admission) Patient's cognitive ability adequate to safely complete daily activities?: No Is the patient deaf or have difficulty hearing?: No Does the patient have difficulty seeing, even when wearing glasses/contacts?: No Does the patient have difficulty concentrating, remembering, or making decisions?: Yes Patient able to express need for assistance with ADLs?: No Does the patient have difficulty dressing or bathing?: No Independently performs ADLs?: Yes (appropriate for developmental age) Does the patient have difficulty walking or climbing stairs?: No Weakness of Legs: None Weakness of Arms/Hands: None  Permission Sought/Granted Permission sought to share information with : Customer service manager    Share Information with  NAME: Fredonia Highland     Permission granted to share info w Relationship: Landlord, caregiver  Permission granted to share info w Contact Information: [phone]  Emotional Assessment Appearance:: Appears stated age Attitude/Demeanor/Rapport: Unable to Assess Affect (typically observed): Unable to Assess Orientation: : Oriented to Self,Oriented  to Place Alcohol / Substance Use: Not Applicable Psych Involvement: Yes (comment) (Outpatient psych recommended for med follow up)  Admission diagnosis:  Disorientation [R41.0] Acute encephalopathy [G93.40] Acute UTI (urinary tract infection) [N39.0] Patient Active Problem List   Diagnosis Date Noted  . Acute encephalopathy 09/18/2020  . Schizoaffective disorder, bipolar type ([place]) 05/13/2020  . Bacteremia due to Staphylococcus aureus 10/03/2019  . Dehydration with hypernatremia 10/03/2019  . AKI (acute kidney injury) ([place]) 10/02/2019  . Transaminitis 10/02/2019  . CVA (cerebral vascular accident) ([place]) 10/02/2019  . AMS (altered mental status) 10/01/2019  . Lithium toxicity 02/16/2019  . Hyperkalemia 02/16/2019  . Acute confusion 12/29/2018  . Toxic metabolic encephalopathy 28/76/8115  . Dysarthria 12/23/2018  . Elevated blood pressure reading 12/23/2018  . Asthma 12/23/2018  . Delirium due to another medical condition   . Acute UTI (urinary tract infection) 12/06/2018  . Acute metabolic encephalopathy 72/62/0355  . Positive hepatitis C antibody test 06/11/2018   PCP:  [facility] Pharmacy:   [place] [address] [place] [address] Phone: [phone] Fax: [phone]  [facility] [address] [place] [address] Phone: [phone] Fax: [phone]     Social Determinants of Health (SDOH) Interventions    Readmission Risk Interventions Readmission Risk Prevention Plan 09/25/2020  Transportation Screening Complete  PCP or Specialist Appt within 5-7 Days Complete  Home Care Screening Complete  Medication Review (RN CM) Complete  Some recent data might be hidden

## 2020-09-25 NOTE — Progress Notes (Signed)
Attempted to get pt OOB. Pt refusing to stand up and when she finally did, she was very unstable and sat back down onto the bed. I set up a chair nearby for her to sit in to get cleaned up. She refused to take a step toward the chair and became agitated. I called Network engineer to notify Percell Locus (as she was on the unit). Percell Locus came in and witnessed the pt's inability and refusal to take even one step toward the chair. The pt stated that she did not feel comfortable going home in this state. Percell Locus asked the pt if she wanted to stay in the hospital and the pt stated she did not. She then asked if the pt felt that she needed to go to a nursing facility and the pt stated that she did feel that way. Pt's bed was soaked. Pt has been using the [facility] while she has been here and has not tried at all on her own to call for assistance to use the bathroom. I asked her when I went in to get her up if I could help her to the bathroom and she refused that also. Pt not appropriate to go home by herself.  Will cont to monitor.

## 2020-09-26 ENCOUNTER — Other Ambulatory Visit (HOSPITAL_COMMUNITY): Payer: Self-pay

## 2020-09-26 DIAGNOSIS — G9340 Encephalopathy, unspecified: Secondary | ICD-10-CM | POA: Diagnosis not present

## 2020-09-26 LAB — CBC WITH DIFFERENTIAL/PLATELET
Abs Immature Granulocytes: 0.02 10*3/uL (ref 0.00–0.07)
Basophils Absolute: 0 10*3/uL (ref 0.0–0.1)
Basophils Relative: 1 %
Eosinophils Absolute: 0.4 10*3/uL (ref 0.0–0.5)
Eosinophils Relative: 7 %
HCT: 43.7 % (ref 36.0–46.0)
Hemoglobin: 13.2 g/dL (ref 12.0–15.0)
Immature Granulocytes: 0 %
Lymphocytes Relative: 62 %
Lymphs Abs: 3.2 10*3/uL (ref 0.7–4.0)
MCH: 22.9 pg — ABNORMAL LOW (ref 26.0–34.0)
MCHC: 30.2 g/dL (ref 30.0–36.0)
MCV: 75.9 fL — ABNORMAL LOW (ref 80.0–100.0)
Monocytes Absolute: 0.5 10*3/uL (ref 0.1–1.0)
Monocytes Relative: 9 %
Neutro Abs: 1.1 10*3/uL — ABNORMAL LOW (ref 1.7–7.7)
Neutrophils Relative %: 21 %
Platelets: 284 10*3/uL (ref 150–400)
RBC: 5.76 MIL/uL — ABNORMAL HIGH (ref 3.87–5.11)
RDW: 14.7 % (ref 11.5–15.5)
WBC: 5.1 10*3/uL (ref 4.0–10.5)
nRBC: 0 % (ref 0.0–0.2)

## 2020-09-26 LAB — MAGNESIUM: Magnesium: 2.1 mg/dL (ref 1.7–2.4)

## 2020-09-26 LAB — PHOSPHORUS: Phosphorus: 2.5 mg/dL (ref 2.5–4.6)

## 2020-09-26 MED ORDER — DIVALPROEX SODIUM 500 MG PO DR TAB
500.0000 mg | DELAYED_RELEASE_TABLET | Freq: Two times a day (BID) | ORAL | 0 refills | Status: DC
  Filled 2020-09-26: qty 60, 30d supply, fill #0

## 2020-09-26 MED ORDER — VALBENAZINE TOSYLATE 40 MG PO CAPS
40.0000 mg | ORAL_CAPSULE | Freq: Every day | ORAL | 0 refills | Status: DC
  Filled 2020-09-26 (×2): qty 30, 30d supply, fill #0

## 2020-09-26 MED ORDER — OLANZAPINE 10 MG PO TABS
10.0000 mg | ORAL_TABLET | Freq: Every day | ORAL | 0 refills | Status: DC
  Filled 2020-09-26: qty 30, 30d supply, fill #0

## 2020-09-26 NOTE — TOC Progression Note (Addendum)
Transition of Care [facility]) - Progression Note    Patient Details  Name: Karen Best MRN: [id number] Date of Birth: 08-Sep-1955  Transition [facility]) CM/SW [place], LCSW Phone Number: 09/26/2020, 12:18 PM  Clinical Narrative:    CSW spoke with patient regarding SNF and that she would be required to sign her check over to the facility every month minus $30. She stated she did not want to do that and will "just go home then because I need to keep my money". CSW asked if she wanted to speak with her landlord/caregiver, Mr. Clide Dales. Patient smiled and said yes. CSW called him from cell phone and she was able to speak with him. He told her he would by her a pee-pad for accidents and then be able to pick her up later today when called. Patient agreeable to a bedside commode and walker just in case. CSW requested delivery to room from [place]. Patient reported also being agreeable to having [facility] ACTT services in place again. CSW left a voicemail for [place] regarding referral process. MD sent medications to [place] for delivery prior to discharge (a few are already at the nursing station). Patient very pleasant and requested grapes; CSW made RN/tech aware. Case staffed with [facility] leadership, Hassan Rowan, as well and she is in agreement with plan.   Update: CSW received return call from [place], Clearnce Hasten. She emailed CSW form for services. CSW completed form and emailed back to her. She stated she will forward it to the ACT Team and they will contact patient/caregiver for an appointment.    Expected Discharge Plan: Home/Self Care Barriers to Discharge: Barriers Resolved  Expected Discharge Plan and Services Expected Discharge Plan: Home/Self Care In-house Referral: Clinical Social Work   Post Acute Care Choice: Durable Medical Equipment (ACTT referral) Living arrangements for the past 2 months: [place] Expected Discharge Date: 09/25/20               DME Arranged:  3-N-1,Walker rolling DME Agency: [employer] Date DME Agency Contacted: 09/26/20 Time DME Agency Contacted: [phone]   [place] Arranged: NA           Social Determinants of Health (SDOH) Interventions    Readmission Risk Interventions Readmission Risk Prevention Plan 09/25/2020  Transportation Screening Complete  PCP or Specialist Appt within 5-7 Days Complete  Home Care Screening Complete  Medication Review (RN CM) Complete  Some recent data might be hidden

## 2020-09-26 NOTE — Progress Notes (Signed)
Subjective: Awake/alert-calm.  Following commands-thinks she is weak-contemplating about going to SNF versus home.  Objective: Blood pressure 123/84, pulse 60, temperature 97.9 F (36.6 C), temperature source Axillary, resp. rate 18, height 5\' 5"  (1.651 m), weight 67.5 kg, SpO2 96 %.  Chest: Clear to auscultation no added sounds CVS: S1-S2 regular Abdomen: Soft nontender nondistended Neuro: Moving all 4 extremities-nonfocal.  She is awake/alert and following commands.  Assessment/plan: 1.Paranoid schizophrenia: Initially was combative-currently calm/quiet/cooperative.  Evaluated by psychiatry-okay for discharge.  Continue Depakote/Zyprexa and Ingrezza 2.  E. coli UTI: Completed antimicrobial therapy 3.  HTN: Blood pressure controlled-continue lisinopril and metoprolol  Patient was discharged by Dr. Sherral Hammers on 5/31-she is currently medically stable for discharge.  Social worker following to ensure safe disposition.

## 2020-09-26 NOTE — Evaluation (Signed)
Physical Therapy Evaluation Patient Details Name: Karen Best MRN: [id number] DOB: Feb 27, 1956 Today's Date: 09/26/2020   History of Present Illness  65yo female admitted 09/18/20 with acute encephalopathy likely secondary to UTI. PMH Bipolar, fibromyalgia, schizophrenia, tobacco abuse  Clinical Impression   Patient received in bed, pleasant and cooperative with therapy this morning. Able to mobilize well at a supervision to min guard level. Attempted gait with RW and without, actually seems to to better without assistive device. Does have wide BOS and tends to over weight shift, as well as moving hips side to side and forward/back in standing, but this seems more habitual and does not appear to be related to loss of balance or weakness. Left in bed with all needs met, bed alarm active and nursing aware of patient status. Suspect that she is likely very close to if not at her baseline- do not anticipate need for skilled PT follow-up, but do recommend 24/7 assist at DC.     Follow Up Recommendations No PT follow up;Supervision/Assistance - 24 hour    Equipment Recommendations  None recommended by PT    Recommendations for Other Services       Precautions / Restrictions Precautions Precautions: Fall Precaution Comments: hx of bipolar and schizophrenia, hx of aggressive behavior with nursing (but very sweet with therapy) Restrictions Weight Bearing Restrictions: No      Mobility  Bed Mobility Overal bed mobility: Needs Assistance Bed Mobility: Supine to Sit;Sit to Supine     Supine to sit: Supervision Sit to supine: Supervision   General bed mobility comments: HOB elevated, use of rail, S for line management    Transfers Overall transfer level: Needs assistance Equipment used: Rolling walker (2 wheeled);None Transfers: Sit to/from Stand Sit to Stand: Min guard         General transfer comment: able to perform functional transfers with min guard with device and without  device- actually a bit safer without device/less distraction in environment  Ambulation/Gait Ambulation/Gait assistance: Min guard Gait Distance (Feet): 20 Feet (55ftx2) Assistive device: Rolling walker (2 wheeled);None Gait Pattern/deviations: Step-through pattern;Decreased step length - right;Decreased step length - left;Decreased stride length;Drifts right/left;Trunk flexed;Wide base of support Gait velocity: decreased   General Gait Details: with wide BOS and tends to over weight shift which gives the appearance of being unsteady- but she was able to mobilize with only min guard without difficulty. Did better without RW as she tends to push it too far away.  Stairs            Wheelchair Mobility    Modified Rankin (Stroke Patients Only)       Balance Overall balance assessment: Mild deficits observed, not formally tested                                           Pertinent Vitals/Pain Pain Assessment: Faces Faces Pain Scale: No hurt Pain Intervention(s): Limited activity within patient's tolerance;Monitored during session    [place] expects to be discharged to:: Other (Comment) (boarding house) Living Arrangements: Alone               Additional Comments: "julius" hleps to take care of her per RN. Hasn't talked to Rolan Bucco for awhile.    Prior Function Level of Independence: Independent         Comments: bathroom has tub and shower but takes bird baths; has  regular toilet, reports she does not use any assistive devices. R handed. No steps in the back entry, lives on first floor.     Hand Dominance   Dominant Hand: Right    Extremity/Trunk Assessment   Upper Extremity Assessment Upper Extremity Assessment: Defer to OT evaluation    Lower Extremity Assessment Lower Extremity Assessment: Overall WFL for tasks assessed    Cervical / Trunk Assessment Cervical / Trunk Assessment: Kyphotic  Communication    Communication: Expressive difficulties  Cognition Arousal/Alertness: Awake/alert Behavior During Therapy: WFL for tasks assessed/performed Overall Cognitive Status: Within Functional Limits for tasks assessed                                 General Comments: hx of bipolar disorder and schizophrenia at baseline; does seem to have some mild processing impairment and reduced problem solving ability but question if this is baseline      General Comments General comments (skin integrity, edema, etc.): mobility seems very close to if not at baseline overall    Exercises     Assessment/Plan    PT Assessment Patient needs continued PT services  PT Problem List Decreased strength;Decreased cognition;Decreased safety awareness;Decreased balance;Decreased mobility;Decreased knowledge of use of DME       PT Treatment Interventions DME instruction;Balance training;Gait training;Cognitive remediation;Stair training;Functional mobility training;Patient/family education;Therapeutic activities;Therapeutic exercise    PT Goals (Current goals can be found in the Care Plan section)  Acute Rehab PT Goals Patient Stated Goal: none stated PT Goal Formulation: With patient Time For Goal Achievement: 10/10/20 Potential to Achieve Goals: Good    Frequency Min 3X/week   Barriers to discharge        Co-evaluation               AM-PAC PT "6 Clicks" Mobility  Outcome Measure Help needed turning from your back to your side while in a flat bed without using bedrails?: None Help needed moving from lying on your back to sitting on the side of a flat bed without using bedrails?: None Help needed moving to and from a bed to a chair (including a wheelchair)?: A Little Help needed standing up from a chair using your arms (e.g., wheelchair or bedside chair)?: A Little Help needed to walk in hospital room?: A Little Help needed climbing 3-5 steps with a railing? : A Little 6 Click Score:  20    End of Session Equipment Utilized During Treatment: Gait belt Activity Tolerance: Patient tolerated treatment well Patient left: in bed;with call bell/phone within reach;with bed alarm set Nurse Communication: Mobility status PT Visit Diagnosis: Unsteadiness on feet (R26.81);Muscle weakness (generalized) (M62.81);Other abnormalities of gait and mobility (R26.89)    Time: 1856-3149 PT Time Calculation (min) (ACUTE ONLY): 21 min   Charges:   PT Evaluation $PT Eval Moderate Complexity: 1 Mod          Windell Norfolk, DPT, PN1   Supplemental Physical Therapist [place]    Pager [phone] Acute Rehab Office [phone]

## 2020-09-26 NOTE — TOC Transition Note (Signed)
Transition of Care [facility]) - CM/SW Discharge Note   Patient Details  Name: Karen Best MRN: [id number] Date of Birth: [birth year]  Transition of Care [facility]) CM/SW Contact:  Benard Halsted, LCSW Phone Number: 09/26/2020, 3:32 PM   Clinical Narrative:    Patient will DC to: Home Anticipated DC date: 09/26/20 Family notified: Mr. Clide Dales Transport by: Mr. Clide Dales by car   Per MD patient ready for DC to home. RN, patient, patient's family notified of DC.  CSW will sign off for now as social work intervention is no longer needed. Please consult [place] again if new needs arise.      Final next level of care: Home/Self Care Barriers to Discharge: Barriers Resolved   Patient Goals and CMS Choice Patient states their goals for this hospitalization and ongoing recovery are:: return home CMS Medicare.gov Compare Post Acute Care list provided to:: Patient Choice offered to / list presented to : Patient  Discharge Placement                Patient to be transferred to facility by: car Name of family member notified: Mr. Clide Dales Patient and family notified of of transfer: 09/26/20  Discharge Plan and Services In-house Referral: Clinical Social Work   Post Acute Care Choice: Durable Medical Equipment (ACTT referral)          DME Arranged: 3-N-1,Walker rolling DME Agency: [employer] Date DME Agency Contacted: 09/26/20 Time DME Agency Contacted: [phone]   [place] Arranged: NA          Social Determinants of Health ([place]) Interventions     Readmission Risk Interventions Readmission Risk Prevention Plan 09/25/2020  Transportation Screening Complete  PCP or Specialist Appt within 5-7 Days Complete  Home Care Screening Complete  Medication Review (RN CM) Complete  Some recent data might be hidden

## 2020-09-26 NOTE — Plan of Care (Signed)
Pt going home today.

## 2020-09-26 NOTE — Evaluation (Signed)
Occupational Therapy Evaluation Patient Details Name: Karen Best MRN: [id number] DOB: 08-10-1955 Today's Date: 09/26/2020    History of Present Illness 65yo female admitted 09/18/20 with acute encephalopathy likely secondary to UTI. PMH Bipolar, fibromyalgia, schizophrenia, tobacco abuse   Clinical Impression   Pt was independent in ADL and reports "Karen Best" helped with meals at the boarding house. Pt pleasant and cooperative. She demonstrates a wobbly gait which may be baseline, but no overt LOB without a device. Pt overall requires set up to min guard assist for ADL. Recommending 24 hour supervision upon discharge. Will follow acutely.     Follow Up Recommendations  No OT follow up, 24 hour supervision    Equipment Recommendations  None recommended by OT    Recommendations for Other Services       Precautions / Restrictions Precautions Precautions: Fall Precaution Comments: hx of bipolar and schizophrenia, hx of aggressive behavior with nursing (but very sweet with therapy) Restrictions Weight Bearing Restrictions: No      Mobility Bed Mobility Overal bed mobility: Needs Assistance Bed Mobility: Supine to Sit;Sit to Supine     Supine to sit: Supervision Sit to supine: Supervision   General bed mobility comments: HOB elevated, use of rail, S for line management    Transfers Overall transfer level: Needs assistance Equipment used: Rolling walker (2 wheeled);None Transfers: Sit to/from Stand Sit to Stand: Min guard         General transfer comment: able to perform functional transfers with min guard with device and without device- actually a bit safer without device/less distraction in environment    Balance Overall balance assessment: Mild deficits observed, not formally tested                                         ADL either performed or assessed with clinical judgement   ADL Overall ADL's : Needs assistance/impaired Eating/Feeding:  Independent   Grooming: Wash/dry hands;Min guard Grooming Details (indicate cue type and reason): cues to find soap Upper Body Bathing: Set up;Sitting   Lower Body Bathing: Min guard;Sit to/from stand   Upper Body Dressing : Set up;Sitting   Lower Body Dressing: Min guard   Toilet Transfer: Min guard;Ambulation;RW;Regular Museum/gallery exhibitions officer and Hygiene: Min guard;Sit to/from stand       Functional mobility during ADLs: Min guard;Rolling walker       Vision Patient Visual Report: Blurring of vision, reports she needs glasses       Perception     Praxis      Pertinent Vitals/Pain Pain Assessment: Faces Faces Pain Scale: No hurt Pain Intervention(s): Limited activity within patient's tolerance;Monitored during session     Hand Dominance Right   Extremity/Trunk Assessment Upper Extremity Assessment Upper Extremity Assessment: Overall WFL for tasks assessed   Lower Extremity Assessment Lower Extremity Assessment: Defer to PT evaluation   Cervical / Trunk Assessment Cervical / Trunk Assessment: Kyphotic   Communication Communication Communication: No difficulties   Cognition Arousal/Alertness: Awake/alert Behavior During Therapy: WFL for tasks assessed/performed Overall Cognitive Status: No family/caregiver present to determine baseline cognitive functioning                                 General Comments: hx of bipolar disorder and schizophrenia at baseline; does seem to have some mild processing impairment and  reduced problem solving ability but question if this is baseline   General Comments  mobility seems very close to if not at baseline overall    Exercises     Shoulder Instructions      Home Living Family/patient expects to be discharged to:: Private residence Living Arrangements: Alone   Type of Home: House (boarding) Home Access: Level entry     Home Layout: Two level;Able to live on main level with  bedroom/bathroom     Bathroom Shower/Tub: Teacher, early years/pre: Standard     Home Equipment: None   Additional Comments: "Karen Best" helps to take care of her per RN. Hasn't talked to Karen Best for awhile.      Prior Functioning/Environment Level of Independence: Needs assistance    ADL's / Homemaking Assistance Needed: Karen Best helps with meals.           OT Problem List: Impaired balance (sitting and/or standing);Decreased safety awareness;Decreased cognition      OT Treatment/Interventions: Self-care/ADL training;Patient/family education;Balance training;Therapeutic activities    OT Goals(Current goals can be found in the care plan section) Acute Rehab OT Goals Patient Stated Goal: none stated OT Goal Formulation: With patient Time For Goal Achievement: 10/10/20 Potential to Achieve Goals: Good ADL Goals Pt Will Perform Grooming: with modified independence;standing Pt Will Transfer to Toilet: with modified independence;ambulating;regular height toilet Pt Will Perform Toileting - Clothing Manipulation and hygiene: with modified independence;sit to/from stand Additional ADL Goal #1: Pt will gather items necessary for ADL around her room modified independently.  OT Frequency: Min 2X/week   Barriers to D/C:            Co-evaluation              AM-PAC OT "6 Clicks" Daily Activity     Outcome Measure Help from another person eating meals?: None Help from another person taking care of personal grooming?: A Little Help from another person toileting, which includes using toliet, bedpan, or urinal?: A Little Help from another person bathing (including washing, rinsing, drying)?: A Little Help from another person to put on and taking off regular upper body clothing?: None Help from another person to put on and taking off regular lower body clothing?: A Little 6 Click Score: 20   End of Session Equipment Utilized During Treatment: Gait belt Nurse  Communication: Mobility status  Activity Tolerance: Patient tolerated treatment well Patient left: in bed;with call bell/phone within reach;with bed alarm set  OT Visit Diagnosis: Other abnormalities of gait and mobility (R26.89);Other symptoms and signs involving cognitive function                Time: 9798-9211 OT Time Calculation (min): 27 min Charges:  OT General Charges $OT Visit: 1 Visit OT Evaluation $OT Eval Low Complexity: 1 Low  Nestor Lewandowsky, OTR/L Acute Rehabilitation Services Pager: [phone] Office: [phone]  Malka So 09/26/2020, 12:05 PM

## 2020-09-26 NOTE — Progress Notes (Signed)
Pt taken down by w/c with commode, walker, all meds from pharmacy, discharge instructions and all charted belongings. Pt sent home in hospital scrubs.

## 2020-09-26 NOTE — TOC Progression Note (Signed)
Transition of Care [facility]) - Progression Note    Patient Details  Name: MARKIA KYER MRN: [id number] Date of Birth: Dec 06, 1955  Transition of Care [facility]) CM/SW [place], LCSW Phone Number: 09/26/2020, 10:01 AM  Clinical Narrative:    CSW received return call from Mr. Clide Dales [phone]). He reported that patient resides at his boarding home ([place]) and that another resident, Rolan Bucco, cooks meals and cleans for the patient. He stated she has no DME because she normally walks but has been sleeping a lot lately and will not take her medications. He stated she has been talking about renewing ACTT services and getting an aide, which he feels is a good idea. He stated that he has known patient for the past 30 years and knows she would not want to go back to SNF, though she "likes" attention. CSW explained that she has been refusing to try to walk. He stated he can come pick patient up. CSW will talk to patient today and have Mr. Clide Dales on the phone to see if that provides any recognition or motivation to walk as SNF is not a current option since she will not agree to sign her Medicaid check over (and she has no bed offers). If she agrees to return home, CSW will complete a [facility] ACTT team referral.     Expected Discharge Plan: Home/Self Care Barriers to Discharge: [place] Team  Expected Discharge Plan and Services Expected Discharge Plan: Home/Self Care In-house Referral: Clinical Social Work   Post Acute Care Choice: NA Living arrangements for the past 2 months: [place] Expected Discharge Date: 09/25/20                                     Social Determinants of Health (SDOH) Interventions    Readmission Risk Interventions Readmission Risk Prevention Plan 09/25/2020  Transportation Screening Complete  PCP or Specialist Appt within 5-7 Days Complete  Home Care Screening Complete  Medication Review (RN CM) Complete  Some recent  data might be hidden

## 2020-09-27 ENCOUNTER — Emergency Department (HOSPITAL_COMMUNITY)
Admission: EM | Admit: 2020-09-27 | Discharge: 2020-09-27 | Disposition: A | Discharge status: Home / Self Care | Payer: Medicaid Other | Attending: Emergency Medicine | Admitting: Emergency Medicine

## 2020-09-27 ENCOUNTER — Other Ambulatory Visit: Payer: Self-pay

## 2020-09-27 ENCOUNTER — Encounter (HOSPITAL_COMMUNITY): Payer: Self-pay | Admitting: *Deleted

## 2020-09-27 DIAGNOSIS — R52 Pain, unspecified: Secondary | ICD-10-CM

## 2020-09-27 DIAGNOSIS — M791 Myalgia, unspecified site: Secondary | ICD-10-CM | POA: Insufficient documentation

## 2020-09-27 DIAGNOSIS — J45909 Unspecified asthma, uncomplicated: Secondary | ICD-10-CM | POA: Diagnosis not present

## 2020-09-27 DIAGNOSIS — F17210 Nicotine dependence, cigarettes, uncomplicated: Secondary | ICD-10-CM | POA: Insufficient documentation

## 2020-09-27 DIAGNOSIS — Z79899 Other long term (current) drug therapy: Secondary | ICD-10-CM | POA: Insufficient documentation

## 2020-09-27 MED ORDER — IBUPROFEN 600 MG PO TABS: 600.0000 mg | ORAL_TABLET | Freq: Four times a day (QID) | ORAL | 0 refills | Status: DC | PRN

## 2020-09-27 MED ORDER — IBUPROFEN 800 MG PO TABS
800.0000 mg | ORAL_TABLET | Freq: Once | ORAL | Status: AC
  Administered 2020-09-27: 800 mg via ORAL
  Filled 2020-09-27: qty 1
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
## 2020-09-27 NOTE — Discharge Instructions (Addendum)
Take ibuprofen as needed for your aches and pain.  Use walker to help you move around, follow-up with your doctor for further care.
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
## 2020-09-27 NOTE — ED Notes (Signed)
Pt has been ringing out multiple times asking to leave and stating that she feels better.

## 2020-09-27 NOTE — ED Notes (Signed)
Pt given sandwich and ginger ale.  EMT placed her in depend and mesh panty.  Pt still stating she would like to leave.  Social work was here to see pt

## 2020-10-30 ENCOUNTER — Encounter (HOSPITAL_COMMUNITY): Payer: Self-pay

## 2020-10-30 ENCOUNTER — Emergency Department (HOSPITAL_COMMUNITY)
Admission: EM | Admit: 2020-10-30 | Discharge: 2020-10-30 | Disposition: A | Discharge status: Home / Self Care | Payer: Medicaid Other | Attending: Emergency Medicine | Admitting: Emergency Medicine

## 2020-10-30 ENCOUNTER — Other Ambulatory Visit: Payer: Self-pay

## 2020-10-30 DIAGNOSIS — J45909 Unspecified asthma, uncomplicated: Secondary | ICD-10-CM | POA: Insufficient documentation

## 2020-10-30 DIAGNOSIS — Z760 Encounter for issue of repeat prescription: Secondary | ICD-10-CM | POA: Insufficient documentation

## 2020-10-30 DIAGNOSIS — F17210 Nicotine dependence, cigarettes, uncomplicated: Secondary | ICD-10-CM | POA: Insufficient documentation

## 2020-10-30 MED ORDER — OLANZAPINE 10 MG PO TABS: 10.0000 mg | ORAL_TABLET | Freq: Every day | ORAL | 0 refills | Status: DC

## 2020-10-30 MED ORDER — DIVALPROEX SODIUM 500 MG PO DR TAB: 500.0000 mg | DELAYED_RELEASE_TABLET | Freq: Two times a day (BID) | ORAL | 0 refills | Status: DC

## 2020-10-30 MED ORDER — IBUPROFEN 600 MG PO TABS: 600.0000 mg | ORAL_TABLET | Freq: Four times a day (QID) | ORAL | 0 refills | Status: DC | PRN

## 2020-10-30 MED ORDER — LISINOPRIL 5 MG PO TABS: 5.0000 mg | ORAL_TABLET | Freq: Every day | ORAL | 0 refills | Status: DC

## 2020-10-30 MED ORDER — METOPROLOL TARTRATE 25 MG PO TABS: 12.5000 mg | ORAL_TABLET | Freq: Two times a day (BID) | ORAL | 0 refills | Status: DC

## 2020-10-30 MED ORDER — TRAZODONE HCL 50 MG PO TABS: 25.0000 mg | ORAL_TABLET | Freq: Every evening | ORAL | 0 refills | Status: DC | PRN

## 2020-10-30 MED ORDER — VALBENAZINE TOSYLATE 40 MG PO CAPS: 40.0000 mg | ORAL_CAPSULE | Freq: Every day | ORAL | 0 refills | Status: DC
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
## 2020-10-30 NOTE — ED Notes (Signed)
At 1700 called curtis with no answer. Notified social worker to assist with ride home for patinet
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
## 2020-10-30 NOTE — ED Notes (Signed)
Called cleo and she states she is no longer caregiver for this patient and cant pick her up, called curtis on chart and he states he will finish his errands and head this way.

## 2020-10-30 NOTE — Progress Notes (Signed)
10/30/2020 604 pm TOC received message that pt's ride did not show up to pick up pt from ED. TOC CM arranged [employer] to home. Pt signed waiver and faxed to [facility]. Pt states she will need a walker to get into home. Explained a used walker was available. Pt agreeable to use the waalker. Was able to provider a donated walker for pt to use to get into the home. States her walker is at home. [place], [place] ED TOC CM [phone]
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
## 2020-10-30 NOTE — Discharge Instructions (Addendum)
You will need to follow-up with your primary care provider for the rest of the refills on your medications. Return to the ER for chest pain, shortness of breath, severe headache, numbness in arms or legs

## 2020-11-07 ENCOUNTER — Encounter (HOSPITAL_COMMUNITY): Payer: Self-pay | Admitting: *Deleted

## 2020-11-07 ENCOUNTER — Emergency Department (HOSPITAL_COMMUNITY): Payer: Medicaid Other

## 2020-11-07 ENCOUNTER — Inpatient Hospital Stay (HOSPITAL_COMMUNITY)
Admission: EM | Admit: 2020-11-07 | Discharge: 2020-11-17 | DRG: 682 | Disposition: A | Discharge status: Home Health | Payer: Medicaid Other | Attending: Internal Medicine | Admitting: Internal Medicine

## 2020-11-07 ENCOUNTER — Other Ambulatory Visit: Payer: Self-pay

## 2020-11-07 DIAGNOSIS — I10 Essential (primary) hypertension: Secondary | ICD-10-CM

## 2020-11-07 DIAGNOSIS — N179 Acute kidney failure, unspecified: Secondary | ICD-10-CM | POA: Diagnosis not present

## 2020-11-07 DIAGNOSIS — I639 Cerebral infarction, unspecified: Secondary | ICD-10-CM | POA: Diagnosis present

## 2020-11-07 DIAGNOSIS — F250 Schizoaffective disorder, bipolar type: Secondary | ICD-10-CM | POA: Diagnosis not present

## 2020-11-07 DIAGNOSIS — E440 Moderate protein-calorie malnutrition: Secondary | ICD-10-CM | POA: Diagnosis present

## 2020-11-07 DIAGNOSIS — Z8673 Personal history of transient ischemic attack (TIA), and cerebral infarction without residual deficits: Secondary | ICD-10-CM

## 2020-11-07 DIAGNOSIS — R718 Other abnormality of red blood cells: Secondary | ICD-10-CM | POA: Diagnosis present

## 2020-11-07 DIAGNOSIS — J45909 Unspecified asthma, uncomplicated: Secondary | ICD-10-CM | POA: Diagnosis present

## 2020-11-07 DIAGNOSIS — Z79899 Other long term (current) drug therapy: Secondary | ICD-10-CM

## 2020-11-07 DIAGNOSIS — G929 Unspecified toxic encephalopathy: Secondary | ICD-10-CM

## 2020-11-07 DIAGNOSIS — G9340 Encephalopathy, unspecified: Secondary | ICD-10-CM

## 2020-11-07 DIAGNOSIS — Z23 Encounter for immunization: Secondary | ICD-10-CM

## 2020-11-07 DIAGNOSIS — Z20822 Contact with and (suspected) exposure to covid-19: Secondary | ICD-10-CM | POA: Diagnosis present

## 2020-11-07 DIAGNOSIS — F17210 Nicotine dependence, cigarettes, uncomplicated: Secondary | ICD-10-CM | POA: Diagnosis present

## 2020-11-07 DIAGNOSIS — R41843 Psychomotor deficit: Secondary | ICD-10-CM | POA: Diagnosis present

## 2020-11-07 DIAGNOSIS — E861 Hypovolemia: Secondary | ICD-10-CM | POA: Diagnosis present

## 2020-11-07 DIAGNOSIS — Z6824 Body mass index (BMI) 24.0-24.9, adult: Secondary | ICD-10-CM

## 2020-11-07 DIAGNOSIS — R471 Dysarthria and anarthria: Secondary | ICD-10-CM | POA: Diagnosis present

## 2020-11-07 DIAGNOSIS — I959 Hypotension, unspecified: Secondary | ICD-10-CM | POA: Diagnosis present

## 2020-11-07 DIAGNOSIS — R4182 Altered mental status, unspecified: Secondary | ICD-10-CM

## 2020-11-07 DIAGNOSIS — E871 Hypo-osmolality and hyponatremia: Secondary | ICD-10-CM | POA: Diagnosis present

## 2020-11-07 DIAGNOSIS — M797 Fibromyalgia: Secondary | ICD-10-CM | POA: Diagnosis present

## 2020-11-07 DIAGNOSIS — N390 Urinary tract infection, site not specified: Secondary | ICD-10-CM | POA: Diagnosis present

## 2020-11-07 HISTORY — DX: Essential (primary) hypertension: I10

## 2020-11-07 LAB — CBC
HCT: 43.4 % (ref 36.0–46.0)
Hemoglobin: 13.5 g/dL (ref 12.0–15.0)
MCH: 23.4 pg — ABNORMAL LOW (ref 26.0–34.0)
MCHC: 31.1 g/dL (ref 30.0–36.0)
MCV: 75.2 fL — ABNORMAL LOW (ref 80.0–100.0)
Platelets: 260 10*3/uL (ref 150–400)
RBC: 5.77 MIL/uL — ABNORMAL HIGH (ref 3.87–5.11)
RDW: 16.9 % — ABNORMAL HIGH (ref 11.5–15.5)
WBC: 8.6 10*3/uL (ref 4.0–10.5)
nRBC: 0 % (ref 0.0–0.2)

## 2020-11-07 LAB — COMPREHENSIVE METABOLIC PANEL
ALT: 23 U/L (ref 0–44)
AST: 23 U/L (ref 15–41)
Albumin: 3.7 g/dL (ref 3.5–5.0)
Alkaline Phosphatase: 63 U/L (ref 38–126)
Anion gap: 11 (ref 5–15)
BUN: 39 mg/dL — ABNORMAL HIGH (ref 8–23)
CO2: 18 mmol/L — ABNORMAL LOW (ref 22–32)
Calcium: 9.6 mg/dL (ref 8.9–10.3)
Chloride: 103 mmol/L (ref 98–111)
Creatinine, Ser: 3.76 mg/dL — ABNORMAL HIGH (ref 0.44–1.00)
GFR, Estimated: 13 mL/min — ABNORMAL LOW (ref >60)
Glucose, Bld: 84 mg/dL (ref 70–99)
Potassium: 4.1 mmol/L (ref 3.5–5.1)
Sodium: 132 mmol/L — ABNORMAL LOW (ref 135–145)
Total Bilirubin: 0.8 mg/dL (ref 0.3–1.2)
Total Protein: 8.1 g/dL (ref 6.5–8.1)

## 2020-11-07 LAB — PROTIME-INR
INR: 1 (ref 0.8–1.2)
Prothrombin Time: 13.7 seconds (ref 11.4–15.2)

## 2020-11-07 LAB — DIFFERENTIAL
Abs Immature Granulocytes: 0.02 10*3/uL (ref 0.00–0.07)
Basophils Absolute: 0 10*3/uL (ref 0.0–0.1)
Basophils Relative: 0 %
Eosinophils Absolute: 0.3 10*3/uL (ref 0.0–0.5)
Eosinophils Relative: 4 %
Immature Granulocytes: 0 %
Lymphocytes Relative: 47 %
Lymphs Abs: 4 10*3/uL (ref 0.7–4.0)
Monocytes Absolute: 1 10*3/uL (ref 0.1–1.0)
Monocytes Relative: 12 %
Neutro Abs: 3.2 10*3/uL (ref 1.7–7.7)
Neutrophils Relative %: 37 %

## 2020-11-07 LAB — I-STAT CHEM 8, ED
BUN: 40 mg/dL — ABNORMAL HIGH (ref 8–23)
Calcium, Ion: 1.12 mmol/L — ABNORMAL LOW (ref 1.15–1.40)
Chloride: 106 mmol/L (ref 98–111)
Creatinine, Ser: 4 mg/dL — ABNORMAL HIGH (ref 0.44–1.00)
Glucose, Bld: 84 mg/dL (ref 70–99)
HCT: 45 % (ref 36.0–46.0)
Hemoglobin: 15.3 g/dL — ABNORMAL HIGH (ref 12.0–15.0)
Potassium: 4 mmol/L (ref 3.5–5.1)
Sodium: 133 mmol/L — ABNORMAL LOW (ref 135–145)
TCO2: 18 mmol/L — ABNORMAL LOW (ref 22–32)

## 2020-11-07 LAB — CBG MONITORING, ED: Glucose-Capillary: 85 mg/dL (ref 70–99)

## 2020-11-07 LAB — APTT: aPTT: 23 seconds — ABNORMAL LOW (ref 24–36)

## 2020-11-07 MED ORDER — SODIUM CHLORIDE 0.9 % IV BOLUS
1000.0000 mL | Freq: Once | INTRAVENOUS | Status: AC
  Administered 2020-11-08: 1000 mL via INTRAVENOUS

## 2020-11-07 MED ORDER — SODIUM CHLORIDE 0.9% FLUSH
3.0000 mL | Freq: Once | INTRAVENOUS | Status: AC
  Administered 2020-11-08: 3 mL via INTRAVENOUS
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
## 2020-11-07 NOTE — ED Notes (Signed)
Called to cancel code stroke per dr. Billy Fischer
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
## 2020-11-08 ENCOUNTER — Inpatient Hospital Stay (HOSPITAL_COMMUNITY): Payer: Medicaid Other

## 2020-11-08 ENCOUNTER — Encounter (HOSPITAL_COMMUNITY): Payer: Self-pay | Admitting: Family Medicine

## 2020-11-08 DIAGNOSIS — Z23 Encounter for immunization: Secondary | ICD-10-CM | POA: Diagnosis not present

## 2020-11-08 DIAGNOSIS — Z20822 Contact with and (suspected) exposure to covid-19: Secondary | ICD-10-CM | POA: Diagnosis present

## 2020-11-08 DIAGNOSIS — I10 Essential (primary) hypertension: Secondary | ICD-10-CM | POA: Insufficient documentation

## 2020-11-08 DIAGNOSIS — R41843 Psychomotor deficit: Secondary | ICD-10-CM | POA: Diagnosis present

## 2020-11-08 DIAGNOSIS — G929 Unspecified toxic encephalopathy: Secondary | ICD-10-CM | POA: Diagnosis present

## 2020-11-08 DIAGNOSIS — J45909 Unspecified asthma, uncomplicated: Secondary | ICD-10-CM | POA: Diagnosis present

## 2020-11-08 DIAGNOSIS — Z79899 Other long term (current) drug therapy: Secondary | ICD-10-CM | POA: Diagnosis not present

## 2020-11-08 DIAGNOSIS — F250 Schizoaffective disorder, bipolar type: Secondary | ICD-10-CM | POA: Diagnosis present

## 2020-11-08 DIAGNOSIS — E861 Hypovolemia: Secondary | ICD-10-CM | POA: Diagnosis present

## 2020-11-08 DIAGNOSIS — Z6824 Body mass index (BMI) 24.0-24.9, adult: Secondary | ICD-10-CM | POA: Diagnosis not present

## 2020-11-08 DIAGNOSIS — N390 Urinary tract infection, site not specified: Secondary | ICD-10-CM | POA: Diagnosis present

## 2020-11-08 DIAGNOSIS — E440 Moderate protein-calorie malnutrition: Secondary | ICD-10-CM | POA: Diagnosis present

## 2020-11-08 DIAGNOSIS — E871 Hypo-osmolality and hyponatremia: Secondary | ICD-10-CM | POA: Insufficient documentation

## 2020-11-08 DIAGNOSIS — R471 Dysarthria and anarthria: Secondary | ICD-10-CM | POA: Diagnosis present

## 2020-11-08 DIAGNOSIS — M797 Fibromyalgia: Secondary | ICD-10-CM | POA: Diagnosis present

## 2020-11-08 DIAGNOSIS — N179 Acute kidney failure, unspecified: Secondary | ICD-10-CM | POA: Diagnosis not present

## 2020-11-08 DIAGNOSIS — R718 Other abnormality of red blood cells: Secondary | ICD-10-CM | POA: Diagnosis present

## 2020-11-08 DIAGNOSIS — I959 Hypotension, unspecified: Secondary | ICD-10-CM | POA: Diagnosis present

## 2020-11-08 DIAGNOSIS — Z8673 Personal history of transient ischemic attack (TIA), and cerebral infarction without residual deficits: Secondary | ICD-10-CM | POA: Diagnosis not present

## 2020-11-08 DIAGNOSIS — F17210 Nicotine dependence, cigarettes, uncomplicated: Secondary | ICD-10-CM | POA: Diagnosis present

## 2020-11-08 LAB — CREATININE, URINE, RANDOM: Creatinine, Urine: 38.42 mg/dL

## 2020-11-08 LAB — BASIC METABOLIC PANEL
Anion gap: 11 (ref 5–15)
BUN: 34 mg/dL — ABNORMAL HIGH (ref 8–23)
CO2: 15 mmol/L — ABNORMAL LOW (ref 22–32)
Calcium: 8.9 mg/dL (ref 8.9–10.3)
Chloride: 113 mmol/L — ABNORMAL HIGH (ref 98–111)
Creatinine, Ser: 2.18 mg/dL — ABNORMAL HIGH (ref 0.44–1.00)
GFR, Estimated: 25 mL/min — ABNORMAL LOW (ref >60)
Glucose, Bld: 73 mg/dL (ref 70–99)
Potassium: 3.9 mmol/L (ref 3.5–5.1)
Sodium: 139 mmol/L (ref 135–145)

## 2020-11-08 LAB — CBC
HCT: 45.1 % (ref 36.0–46.0)
Hemoglobin: 14 g/dL (ref 12.0–15.0)
MCH: 23.2 pg — ABNORMAL LOW (ref 26.0–34.0)
MCHC: 31 g/dL (ref 30.0–36.0)
MCV: 74.8 fL — ABNORMAL LOW (ref 80.0–100.0)
Platelets: 204 10*3/uL (ref 150–400)
RBC: 6.03 MIL/uL — ABNORMAL HIGH (ref 3.87–5.11)
RDW: 17.5 % — ABNORMAL HIGH (ref 11.5–15.5)
WBC: 7.6 10*3/uL (ref 4.0–10.5)
nRBC: 0 % (ref 0.0–0.2)

## 2020-11-08 LAB — I-STAT VENOUS BLOOD GAS, ED
Acid-base deficit: 8 mmol/L — ABNORMAL HIGH (ref 0.0–2.0)
Bicarbonate: 17.9 mmol/L — ABNORMAL LOW (ref 20.0–28.0)
Calcium, Ion: 1.13 mmol/L — ABNORMAL LOW (ref 1.15–1.40)
HCT: 48 % — ABNORMAL HIGH (ref 36.0–46.0)
Hemoglobin: 16.3 g/dL — ABNORMAL HIGH (ref 12.0–15.0)
O2 Saturation: 72 %
Potassium: 4.5 mmol/L (ref 3.5–5.1)
Sodium: 133 mmol/L — ABNORMAL LOW (ref 135–145)
TCO2: 19 mmol/L — ABNORMAL LOW (ref 22–32)
pCO2, Ven: 37.1 mmHg — ABNORMAL LOW (ref 44.0–60.0)
pH, Ven: 7.291 (ref 7.250–7.430)
pO2, Ven: 42 mmHg (ref 32.0–45.0)

## 2020-11-08 LAB — RAPID URINE DRUG SCREEN, HOSP PERFORMED
Amphetamines: NOT DETECTED
Barbiturates: NOT DETECTED
Benzodiazepines: NOT DETECTED
Cocaine: NOT DETECTED
Opiates: NOT DETECTED
Tetrahydrocannabinol: NOT DETECTED

## 2020-11-08 LAB — URINALYSIS, ROUTINE W REFLEX MICROSCOPIC
Bilirubin Urine: NEGATIVE
Glucose, UA: NEGATIVE mg/dL
Hgb urine dipstick: NEGATIVE
Ketones, ur: 5 mg/dL — AB
Leukocytes,Ua: NEGATIVE
Nitrite: NEGATIVE
Protein, ur: NEGATIVE mg/dL
Specific Gravity, Urine: 1.006 (ref 1.005–1.030)
pH: 7 (ref 5.0–8.0)

## 2020-11-08 LAB — LACTIC ACID, PLASMA
Lactic Acid, Venous: 3.5 mmol/L (ref 0.5–1.9)
Lactic Acid, Venous: 1.5 mmol/L (ref 0.5–1.9)

## 2020-11-08 LAB — SODIUM, URINE, RANDOM: Sodium, Ur: 31 mmol/L

## 2020-11-08 LAB — VITAMIN B12: Vitamin B-12: 872 pg/mL (ref 180–914)

## 2020-11-08 LAB — VALPROIC ACID LEVEL: Valproic Acid Lvl: 20 ug/mL — ABNORMAL LOW (ref 50.0–100.0)

## 2020-11-08 LAB — TROPONIN I (HIGH SENSITIVITY)
Troponin I (High Sensitivity): 9 ng/L (ref <18)
Troponin I (High Sensitivity): 9 ng/L (ref <18)

## 2020-11-08 LAB — T4, FREE: Free T4: 1.27 ng/dL — ABNORMAL HIGH (ref 0.61–1.12)

## 2020-11-08 LAB — AMMONIA
Ammonia: 64 umol/L — ABNORMAL HIGH (ref 9–35)
Ammonia: 35 umol/L (ref 9–35)

## 2020-11-08 LAB — TSH: TSH: 0.208 u[IU]/mL — ABNORMAL LOW (ref 0.350–4.500)

## 2020-11-08 LAB — ETHANOL: Alcohol, Ethyl (B): <10 mg/dL (ref <10)

## 2020-11-08 LAB — SARS CORONAVIRUS 2 (TAT 6-24 HRS): SARS Coronavirus 2: NEGATIVE

## 2020-11-08 LAB — RPR: RPR Ser Ql: NONREACTIVE

## 2020-11-08 LAB — HIV ANTIBODY (ROUTINE TESTING W REFLEX): HIV Screen 4th Generation wRfx: NONREACTIVE

## 2020-11-08 MED ORDER — ACETAMINOPHEN 325 MG PO TABS
650.0000 mg | ORAL_TABLET | Freq: Four times a day (QID) | ORAL | Status: DC | PRN
  Administered 2020-11-12 – 2020-11-15 (×2): 650 mg via ORAL
  Filled 2020-11-08 (×2): qty 2

## 2020-11-08 MED ORDER — DIVALPROEX SODIUM 250 MG PO DR TAB: 500.0000 mg | DELAYED_RELEASE_TABLET | Freq: Two times a day (BID) | ORAL | Status: DC

## 2020-11-08 MED ORDER — SODIUM CHLORIDE 0.9 % IV SOLN: INTRAVENOUS | Status: AC

## 2020-11-08 MED ORDER — PNEUMOCOCCAL VAC POLYVALENT 25 MCG/0.5ML IJ INJ
0.5000 mL | INJECTION | INTRAMUSCULAR | Status: AC
  Administered 2020-11-12: 0.5 mL via INTRAMUSCULAR
  Filled 2020-11-08 (×2): qty 0.5

## 2020-11-08 MED ORDER — CLONAZEPAM 0.5 MG PO TABS
0.5000 mg | ORAL_TABLET | Freq: Two times a day (BID) | ORAL | Status: DC | PRN
  Administered 2020-11-11 – 2020-11-16 (×6): 0.5 mg via ORAL
  Filled 2020-11-08 (×6): qty 1

## 2020-11-08 MED ORDER — ADULT MULTIVITAMIN W/MINERALS CH
1.0000 | ORAL_TABLET | Freq: Every day | ORAL | Status: DC
  Administered 2020-11-08 – 2020-11-17 (×10): 1 via ORAL
  Filled 2020-11-08 (×10): qty 1

## 2020-11-08 MED ORDER — ONDANSETRON HCL 4 MG/2ML IJ SOLN: 4.0000 mg | Freq: Four times a day (QID) | INTRAMUSCULAR | Status: DC | PRN

## 2020-11-08 MED ORDER — ENSURE ENLIVE PO LIQD
237.0000 mL | Freq: Two times a day (BID) | ORAL | Status: DC
  Administered 2020-11-08 – 2020-11-17 (×19): 237 mL via ORAL
  Filled 2020-11-08: qty 237

## 2020-11-08 MED ORDER — ONDANSETRON HCL 4 MG PO TABS: 4.0000 mg | ORAL_TABLET | Freq: Four times a day (QID) | ORAL | Status: DC | PRN

## 2020-11-08 MED ORDER — HEPARIN SODIUM (PORCINE) 5000 UNIT/ML IJ SOLN
5000.0000 [IU] | Freq: Three times a day (TID) | INTRAMUSCULAR | Status: DC
  Administered 2020-11-08 – 2020-11-17 (×28): 5000 [IU] via SUBCUTANEOUS
  Filled 2020-11-08 (×29): qty 1

## 2020-11-08 MED ORDER — LACTULOSE 10 GM/15ML PO SOLN
20.0000 g | Freq: Three times a day (TID) | ORAL | Status: DC
  Administered 2020-11-08 (×2): 20 g via ORAL
  Filled 2020-11-08 (×2): qty 30

## 2020-11-08 MED ORDER — OLANZAPINE 10 MG PO TABS: 10.0000 mg | ORAL_TABLET | Freq: Every day | ORAL | Status: DC

## 2020-11-08 MED ORDER — ACETAMINOPHEN 650 MG RE SUPP: 650.0000 mg | Freq: Four times a day (QID) | RECTAL | Status: DC | PRN

## 2020-11-08 NOTE — H&P (Signed)
History and Physical    [name] NTI:[id number] DOB: 10-03-1955 DOA: 11/07/2020  PCP: [facility]   Patient coming from: Home   Chief Complaint: Altered mental status   HPI: Karen Best is a 65 y.o. female with medical history significant for schizophrenia, history of CVA, hypertension, and tobacco abuse, presenting to the emergency department for evaluation of somnolence and dysarthria.  Patient is unable to contribute to the history and her emergency contacts cannot be reached.  Per report of EMS, the patient was noted by roommates to been yelling all throughout the day and then found slumped over in a chair.  She was found to be somnolent and dysarthric by EMS with blood pressure of 78 systolic.  She was given 2 mg of Narcan with no significant change in her condition reported.  ED Course: Upon arrival to the ED, patient is found to be afebrile, saturating 90s on room air, heart rate in the 60s to 86P, and systolic blood pressure in the low 100s.  EKG features sinus rhythm with LAFB and minimal ST elevation anteriorly.  Chest x-ray negative for acute cardiopulmonary disease.  Head CT with no acute abnormality.  Chemistry panel notable for BUN of 39, sodium 132, bicarbonate 18, and creatinine of 3.76.  CBC with microcytosis without anemia.  Lactic acid 3.5.  Ammonia 64.  Valproic acid level 20.  Patient was given 2 L of saline in the ED.  Review of Systems:  Unable to complete ROS secondary the patient's clinical condition.  Past Medical History:  Diagnosis Date   Asthma    Bipolar 1 disorder ([place])    Fibromyalgia    Schizophrenia ([place])    Tobacco abuse     Past Surgical History:  Procedure Laterality Date   CESAREAN SECTION     TUBAL LIGATION      Social History:   reports that she has been smoking cigarettes. She has been smoking an average of .5 packs per day. She has never used smokeless tobacco. She reports current alcohol use. She  reports current drug use. Drug: Cocaine.  No Known Allergies  Family History  Problem Relation Age of Onset   Liver cancer Neg Hx    Liver disease Neg Hx      Prior to Admission medications   Medication Sig Start Date End Date Taking? Authorizing Provider  clonazePAM (KLONOPIN) 0.5 MG tablet Take 0.5 mg by mouth 2 (two) times daily as needed for anxiety. 09/06/20   [provider]  divalproex (DEPAKOTE) 500 MG DR tablet Take 1 tablet (500 mg total) by mouth every 12 (twelve) hours. 10/30/20   Khatri, Hina, PA-C  ibuprofen (ADVIL) 600 MG tablet Take 1 tablet (600 mg total) by mouth every 6 (six) hours as needed for moderate pain. 10/30/20   Khatri, Hina, PA-C  lisinopril (ZESTRIL) 5 MG tablet Take 1 tablet (5 mg total) by mouth daily. 10/30/20   Khatri, Hina, PA-C  metoprolol tartrate (LOPRESSOR) 25 MG tablet Take 0.5 tablets (12.5 mg total) by mouth 2 (two) times daily. 10/30/20   Khatri, Hina, PA-C  nystatin cream (MYCOSTATIN) APPLY TO AFFECTED AREA TWICE A DAY FOR 7 DAYS 08/06/20   [provider]  OLANZapine (ZYPREXA) 10 MG tablet Take 1 tablet (10 mg total) by mouth at bedtime. 10/30/20   Khatri, Hina, PA-C  traZODone (DESYREL) 50 MG tablet Take 0.5 tablets (25 mg total) by mouth at bedtime as needed for sleep. 10/30/20   [name] Coss,  Hina, PA-C  valbenazine (INGREZZA) 40 MG capsule Take 1 capsule (40 mg total) by mouth at bedtime. 10/30/20   Delia Heady, PA-C    Physical Exam: Vitals:   11/07/20 2144  Weight: 67 kg  Height: 5\' 5"  (1.651 m)    Constitutional: NAD, calm  Eyes: PERTLA, lids and conjunctivae normal ENMT: Mucous membranes are moist. Posterior pharynx clear of any exudate or lesions.   Neck: supple, no masses  Respiratory: no wheezing, no crackles. No accessory muscle use.  Cardiovascular: S1 & S2 heard, regular rate and rhythm. No extremity edema.   Abdomen: No distension, no tenderness, soft. Bowel sounds active.  Musculoskeletal: no clubbing / cyanosis. No joint  deformity upper and lower extremities.   Skin: no significant rashes, lesions, ulcers. Warm, dry, well-perfused. Neurologic: No gross facial asymmetry. Sensation intact. Moving all exremities.  Psychiatric: Sleeping. Wakes to voice and answers some "yes/no" questions before quickly falling asleep.    Labs and Imaging on Admission: I have personally reviewed following labs and imaging studies  CBC: Recent Labs  Lab 11/07/20 2127 11/07/20 2141 11/07/20 2359  WBC 8.6  --   --   NEUTROABS 3.2  --   --   HGB 13.5 15.3* 16.3*  HCT 43.4 45.0 48.0*  MCV 75.2*  --   --   PLT 260  --   --    Basic Metabolic Panel: Recent Labs  Lab 11/07/20 2127 11/07/20 2141 11/07/20 2359  NA 132* 133* 133*  K 4.1 4.0 4.5  CL 103 106  --   CO2 18*  --   --   GLUCOSE 84 84  --   BUN 39* 40*  --   CREATININE 3.76* 4.00*  --   CALCIUM 9.6  --   --    GFR: Estimated Creatinine Clearance: 12.8 mL/min (A) (by C-G formula based on SCr of 4 mg/dL (H)). Liver Function Tests: Recent Labs  Lab 11/07/20 2127  AST 23  ALT 23  ALKPHOS 63  BILITOT 0.8  PROT 8.1  ALBUMIN 3.7   No results for input(s): LIPASE, AMYLASE in the last 168 hours. Recent Labs  Lab 11/07/20 2154  AMMONIA 64*   Coagulation Profile: Recent Labs  Lab 11/07/20 2127  INR 1.0   Cardiac Enzymes: No results for input(s): CKTOTAL, CKMB, CKMBINDEX, TROPONINI in the last 168 hours. BNP (last 3 results) No results for input(s): PROBNP in the last 8760 hours. HbA1C: No results for input(s): HGBA1C in the last 72 hours. CBG: Recent Labs  Lab 11/07/20 2131  GLUCAP 85   Lipid Profile: No results for input(s): CHOL, HDL, LDLCALC, TRIG, CHOLHDL, LDLDIRECT in the last 72 hours. Thyroid Function Tests: No results for input(s): TSH, T4TOTAL, FREET4, T3FREE, THYROIDAB in the last 72 hours. Anemia Panel: No results for input(s): VITAMINB12, FOLATE, FERRITIN, TIBC, IRON, RETICCTPCT in the last 72 hours. Urine analysis:     Component Value Date/Time   COLORURINE AMBER (A) 09/18/2020 2200   APPEARANCEUR CLOUDY (A) 09/18/2020 2200   LABSPEC 1.018 09/18/2020 2200   PHURINE 6.0 09/18/2020 2200   GLUCOSEU NEGATIVE 09/18/2020 2200   HGBUR NEGATIVE 09/18/2020 2200   BILIRUBINUR NEGATIVE 09/18/2020 2200   KETONESUR NEGATIVE 09/18/2020 2200   PROTEINUR 30 (A) 09/18/2020 2200   UROBILINOGEN 0.2 10/26/2018 1355   NITRITE POSITIVE (A) 09/18/2020 2200   LEUKOCYTESUR TRACE (A) 09/18/2020 2200   Sepsis Labs: @LABRCNTIP (procalcitonin:4,lacticidven:4) )No results found for this or any previous visit (from the past 240 hour(s)).   Radiological  Exams on Admission: CT HEAD WO CONTRAST  Result Date: 11/07/2020 CLINICAL DATA:  Altered mental status EXAM: CT HEAD WITHOUT CONTRAST TECHNIQUE: Contiguous axial images were obtained from the base of the skull through the vertex without intravenous contrast. COMPARISON:  09/19/2020 FINDINGS: Brain: No evidence of acute infarction, hemorrhage, hydrocephalus, extra-axial collection or mass lesion/mass effect. Mild chronic white matter ischemic changes are noted. Vascular: No hyperdense vessel or unexpected calcification. Skull: Normal. Negative for fracture or focal lesion. Sinuses/Orbits: No acute finding. Other: None. IMPRESSION: Chronic white matter ischemic changes without acute abnormality. Electronically Signed   By: Inez Catalina M.D.   On: 11/07/2020 23:14   DG Chest Portable 1 View  Result Date: 11/07/2020 CLINICAL DATA:  Altered mental status EXAM: PORTABLE CHEST 1 VIEW COMPARISON:  10/01/2019 FINDINGS: Cardiac shadow is stable. No focal infiltrate or effusion is seen. No bony abnormality is noted. Mild interstitial scarring is again seen and stable. No other focal abnormality is seen. IMPRESSION: No active disease. Electronically Signed   By: Inez Catalina M.D.   On: 11/07/2020 22:25    EKG: Independently reviewed. Sinus rhythm, LAFB, minimal ST-elevations anteriorly.    Assessment/Plan  1. Acute kidney injury  - Presents with somnolence and dysarthria after she had reportedly been yelling throughout the day and is found to have SCr of 3.76, up from 0.64 on Sep 24, 2020  - She appears hypovolemic and was given 2 liters NS in ED  - Check UA and urine chemistries, hold lisinopril and Advil, renally-dose medications, continue IVF hydration, and repeat serum chem panel    2. Acute encephalopathy  - Patient was reportedly yelling throughout the day but then found slumped over and dysarthric  - No acute findings on head CT  - Ammonia is mildly elevated; UDS, TSH, B12, and RPR are pending   - She appears to be improving in ED now wakes to voice, answers yes/no questions, and complains of hunger before returning to sleep  - Hold sedating medications initially, start lactulose, continue supportive care, follow-up pending labs   3. Schizophrenia  - She is somnolent in ED and sedating medications held on admission    4. Hypertension  - SBP reportedly 70s with EMS, low 100s in ED  - Hold antihypertensives initially   5. Hyponatremia  - Serum sodium is 132 in setting of hypovolemia  - Continue IVF hydration with NS and monitor    DVT prophylaxis: sq heparin  Code Status: Full  Level of Care: Level of care: Progressive Family Communication: None available  Disposition Plan:  Patient is from: Home  Anticipated d/c is to: TBD Anticipated d/c date is: 11/12/20  Patient currently: pending improvement in renal function and mental status  Consults called: none  Admission status: Inpatient     Vianne Bulls, MD [facility]  11/08/2020, 1:38 AM

## 2020-11-08 NOTE — Progress Notes (Signed)
PROGRESS NOTE    REHANA UNCAPHER  ZOX:[id number] DOB: 31-Oct-1955 DOA: 11/07/2020 PCP: [facility]   Chief Complaint  Patient presents with   Altered Mental Status   Brief Narrative:   Karen Best is Karen Best 65 y.o. female with medical history significant for schizophrenia, history of CVA, hypertension, and tobacco abuse, presenting to the emergency department for evaluation of somnolence and dysarthria.  Patient is unable to contribute to the history and her emergency contacts cannot be reached.  Per report of EMS, the patient was noted by roommates to been yelling all throughout the day and then found slumped over in [employer] [employer] chair.  She was found to be somnolent and dysarthric by EMS with blood pressure of 78 systolic.  She was given 2 mg of Narcan with no significant change in her condition reported.   ED Course: Upon arrival to the ED, patient is found to be afebrile, saturating 90s on room air, heart rate in the 60s to 81X, and systolic blood pressure in the low 100s.  EKG features sinus rhythm with LAFB and minimal ST elevation anteriorly.  Chest x-ray negative for acute cardiopulmonary disease.  Head CT with no acute abnormality.  Chemistry panel notable for BUN of 39, sodium 132, bicarbonate 18, and creatinine of 3.76.  CBC with microcytosis without anemia.  Lactic acid 3.5.  Ammonia 64.  Valproic acid level 20.  Patient was given 2 L of saline in the ED.  Assessment & Plan:   Principal Problem:   AKI (acute kidney injury) ([place]) Active Problems:   CVA (cerebral vascular accident) ([place])   Schizoaffective disorder, bipolar type ([place])   Acute encephalopathy  2. Acute encephalopathy  Concern for Psychosis - Patient was reportedly yelling throughout the day but then found slumped over and dysarthric  - No acute findings on head CT  - Ammonia is mildly elevated -> repeat was WNL - UDS negative - TSH low, follow free T4  - B12 wnl  - RPR nonreactive -  delusions noted today, she reports hallucinations, but doesn't describe these clearly - unclear baseline - will ask psych to evaluate with concern for psychosis  1. Acute kidney injury  - baseline creatinine <1, presented with creatinine 3.76 - peaked at 4 - [place] without hydro - continue IVF - Check UA ([place]) and urine chemistries, hold lisinopril and Advil, renally-dose medications, continue IVF hydration, and repeat serum chem panel     3. Schizophrenia  - She is somnolent in ED and sedating medications held on admission     4. Hypertension  Hypotension - SBP reportedly 70s with EMS, low 100s in ED - Hold antihypertensives initially - BP improved today   5. Hyponatremia - Serum sodium is 132 in setting of hypovolemia - Continue IVF hydration with NS and monitor   DVT prophylaxis: heparin  Code Status: full  Family Communication: none Disposition:   Status is: Inpatient  Remains inpatient appropriate because:Inpatient level of care appropriate due to severity of illness  Dispo: The patient is from:  group home              Anticipated d/c is to:  pending              Patient currently is not medically stable to d/c.   Difficult to place patient No       Consultants:  psych  Procedures:  none  Antimicrobials:  Anti-infectives (From admission, onward)    None  Subjective: Denies pain  Objective: Vitals:   11/08/20 0400 11/08/20 0504 11/08/20 0800 11/08/20 1230  BP: 109/62 128/71 104/69 (!) 151/83  Pulse: 60 61 64 60  Resp: 12 18 15 16   Temp:   97.7 F (36.5 C) 98.5 F (36.9 C)  TempSrc:  Axillary Axillary Axillary  SpO2: 98% 100% 95% 100%  Weight:  68.5 kg    Height:  5\' 5"  (1.651 m)      Intake/Output Summary (Last 24 hours) at 11/08/2020 1808 Last data filed at 11/08/2020 1500 Gross per 24 hour  Intake 4327.1 ml  Output 1250 ml  Net 3077.1 ml   Filed Weights   11/07/20 2144 11/08/20 0504  Weight: 67 kg 68.5 kg     Examination:  General exam: Appears calm and comfortable  Respiratory system: unlabored Cardiovascular system: RRR Gastrointestinal system: Abdomen is nondistended, soft and nontender.  Central nervous system  Psych: no appreciable focal deficits - delusions - acknowledges seeing/hearing things others don't - denies SI Extremities: no LEE Skin: No rashes, lesions or ulcers    Data Reviewed: I have personally reviewed following labs and imaging studies  CBC: Recent Labs  Lab 11/07/20 2127 11/07/20 2141 11/07/20 2359 11/08/20 0717  WBC 8.6  --   --  7.6  NEUTROABS 3.2  --   --   --   HGB 13.5 15.3* 16.3* 14.0  HCT 43.4 45.0 48.0* 45.1  MCV 75.2*  --   --  74.8*  PLT 260  --   --  211    Basic Metabolic Panel: Recent Labs  Lab 11/07/20 2127 11/07/20 2141 11/07/20 2359 11/08/20 0717  NA 132* 133* 133* 139  K 4.1 4.0 4.5 3.9  CL 103 106  --  113*  CO2 18*  --   --  15*  GLUCOSE 84 84  --  73  BUN 39* 40*  --  34*  CREATININE 3.76* 4.00*  --  2.18*  CALCIUM 9.6  --   --  8.9    GFR: Estimated Creatinine Clearance: 25.4 mL/min (Erlean Mealor) (by C-G formula based on SCr of 2.18 mg/dL (H)).  Liver Function Tests: Recent Labs  Lab 11/07/20 2127  AST 23  ALT 23  ALKPHOS 63  BILITOT 0.8  PROT 8.1  ALBUMIN 3.7    CBG: Recent Labs  Lab 11/07/20 2131  GLUCAP 85     Recent Results (from the past 240 hour(s))  SARS CORONAVIRUS 2 (TAT 6-24 HRS) Nasopharyngeal Nasopharyngeal Swab     Status: None   Collection Time: 11/08/20 12:52 AM   Specimen: Nasopharyngeal Swab  Result Value Ref Range Status   SARS Coronavirus 2 NEGATIVE NEGATIVE Final    Comment: (NOTE) SARS-CoV-2 target nucleic acids are NOT DETECTED.  The SARS-CoV-2 RNA is generally detectable in upper and lower respiratory specimens during the acute phase of infection. Negative results do not preclude SARS-CoV-2 infection, do not rule out co-infections with other pathogens, and should not be used as  the sole basis for treatment or other patient management decisions. Negative results must be combined with clinical observations, patient history, and epidemiological information. The expected result is Negative.  Fact Sheet for Patients: SugarRoll.be  Fact Sheet for Healthcare Providers: https://www.woods-mathews.com/  This test is not yet approved or cleared by the [place] FDA and  has been authorized for detection and/or diagnosis of SARS-CoV-2 by FDA under an Emergency Use Authorization (EUA). This EUA will remain  in effect (meaning this test can be used) for  the duration of the COVID-19 declaration under Se ction 564(b)(1) of the Act, 21 U.S.C. section 360bbb-3(b)(1), unless the authorization is terminated or revoked sooner.  Performed at [facility], [place] [address] [place], [place] [address]          Radiology Studies: CT HEAD WO CONTRAST  Result Date: 11/07/2020 CLINICAL DATA:  Altered mental status EXAM: CT HEAD WITHOUT CONTRAST TECHNIQUE: Contiguous axial images were obtained from the base of the skull through the vertex without intravenous contrast. COMPARISON:  09/19/2020 FINDINGS: Brain: No evidence of acute infarction, hemorrhage, hydrocephalus, extra-axial collection or mass lesion/mass effect. Mild chronic white matter ischemic changes are noted. Vascular: No hyperdense vessel or unexpected calcification. Skull: Normal. Negative for fracture or focal lesion. Sinuses/Orbits: No acute finding. Other: None. IMPRESSION: Chronic white matter ischemic changes without acute abnormality. Electronically Signed   By: Inez Catalina M.D.   On: 11/07/2020 23:14   US RENAL  Result Date: 11/08/2020 CLINICAL DATA:  Acute kidney injury. EXAM: RENAL / URINARY TRACT ULTRASOUND COMPLETE COMPARISON:  None. FINDINGS: Right Kidney: Renal measurements: 9.2 x 5.7 x 5.9 cm = volume: 162 mL. Echogenicity within normal limits. No mass or  hydronephrosis visualized. Left Kidney: Renal measurements: 11.0 x 5.6 x 6.3 cm = volume: 201 mL. Echogenicity within normal limits. No mass or hydronephrosis visualized. Bladder: Appears normal for degree of bladder distention. Ureteral jets could not be demonstrated on color Doppler evaluation of the bladder. Other: Incidental note of cholelithiasis. IMPRESSION: No evidence for hydronephrosis. Cholelithiasis. Electronically Signed   By: Misty Stanley M.D.   On: 11/08/2020 14:14   DG Chest Portable 1 View  Result Date: 11/07/2020 CLINICAL DATA:  Altered mental status EXAM: PORTABLE CHEST 1 VIEW COMPARISON:  10/01/2019 FINDINGS: Cardiac shadow is stable. No focal infiltrate or effusion is seen. No bony abnormality is noted. Mild interstitial scarring is again seen and stable. No other focal abnormality is seen. IMPRESSION: No active disease. Electronically Signed   By: Inez Catalina M.D.   On: 11/07/2020 22:25        Scheduled Meds:  feeding supplement  237 mL Oral BID BM   heparin  5,000 Units Subcutaneous Q8H   lactulose  20 g Oral TID   multivitamin with minerals  1 tablet Oral Daily   [START ON 11/09/2020] pneumococcal 23 valent vaccine  0.5 mL Intramuscular Tomorrow-1000   Continuous Infusions:  sodium chloride 125 mL/hr at 11/08/20 1737     LOS: 0 days    Time spent: over 30 min    Fayrene Helper, MD [facility]   To contact the attending provider between 7A-7P or the covering provider during after hours 7P-7A, please log into the web site [email or web address] and access using universal [place] password for that web site. If you do not have the password, please call the hospital operator.  11/08/2020, 6:08 PM

## 2020-11-08 NOTE — Progress Notes (Addendum)
Initial Nutrition Assessment  DOCUMENTATION CODES:   Non-severe (moderate) malnutrition in context of chronic illness  INTERVENTION:   Ensure Enlive po BID, each supplement provides 350 kcal and 20 grams of protein. Downgrade diet to dysphagia 3 (mechanical soft) with thin liquids. MVI with minerals daily. Lactaid with meals.  NUTRITION DIAGNOSIS:   Moderate Malnutrition related to chronic illness (asthma, schizophrenia, Bipolar disorder) as evidenced by mild fat depletion, mild muscle depletion.  GOAL:   Patient will meet greater than or equal to 90% of their needs  MONITOR:   PO intake, Supplement acceptance  REASON FOR ASSESSMENT:   Malnutrition Screening Tool    ASSESSMENT:   65 yo female admitted with AMS. PMH includes schizophrenia, CVA, HTN, tobacco abuse, asthma, Bipolar disorder, fibromyalgia.  Patient unable to provide any nutrition history at time of visit d/t confusion. She did say that she drinks Lactaid d/t lactose intolerance. She has some mild depletion of muscle and subcutaneous fat mass; suspect intake has been poor PTA. Per admission MST, patient has lost weight and has been eating poorly d/t decreased appetite PTA. Patient does not have teeth, will downgrade diet to dysphagia 3 for easier chewing.   Labs reviewed.  Medications reviewed and include lactulose.  Usual weights reviewed. No significant weight changes noted over the past year.  NUTRITION - FOCUSED PHYSICAL EXAM:  Flowsheet Row Most Recent Value  Orbital Region Mild depletion  Upper Arm Region No depletion  Thoracic and Lumbar Region Mild depletion  Buccal Region Mild depletion  Temple Region Mild depletion  Clavicle Bone Region Moderate depletion  Clavicle and Acromion Bone Region Mild depletion  Scapular Bone Region Mild depletion  Dorsal Hand Mild depletion  Patellar Region No depletion  Anterior Thigh Region No depletion  Posterior Calf Region No depletion  Edema (RD  Assessment) None  Hair Reviewed  Eyes Reviewed  Mouth Reviewed  Skin Reviewed  Nails Reviewed       Diet Order:   Diet Order             DIET DYS 3 Room service appropriate? Yes with Assist; Fluid consistency: Thin  Diet effective now                   EDUCATION NEEDS:   Not appropriate for education at this time  Skin:  Skin Assessment: Reviewed RN Assessment  Last BM:  7/13  Height:   Ht Readings from Last 1 Encounters:  11/08/20 5\' 5"  (1.651 m)    Weight:   Wt Readings from Last 1 Encounters:  11/08/20 68.5 kg    Ideal Body Weight:  56.8 kg  BMI:  Body mass index is 25.13 kg/m.  Estimated Nutritional Needs:   Kcal:  1700-1900  Protein:  85-95 gm  Fluid:  >/= 1.7 L    Lucas Mallow, RD, LDN, CNSC Please refer to Amion for contact information.

## 2020-11-08 NOTE — ED Notes (Signed)
The pt is more alert now than 30 minutes ago

## 2020-11-09 DIAGNOSIS — N179 Acute kidney failure, unspecified: Secondary | ICD-10-CM | POA: Diagnosis not present

## 2020-11-09 LAB — CBC
HCT: 37.2 % (ref 36.0–46.0)
Hemoglobin: 11.7 g/dL — ABNORMAL LOW (ref 12.0–15.0)
MCH: 23.1 pg — ABNORMAL LOW (ref 26.0–34.0)
MCHC: 31.5 g/dL (ref 30.0–36.0)
MCV: 73.4 fL — ABNORMAL LOW (ref 80.0–100.0)
Platelets: 247 10*3/uL (ref 150–400)
RBC: 5.07 MIL/uL (ref 3.87–5.11)
RDW: 16.6 % — ABNORMAL HIGH (ref 11.5–15.5)
WBC: 6.5 10*3/uL (ref 4.0–10.5)
nRBC: 0 % (ref 0.0–0.2)

## 2020-11-09 LAB — BASIC METABOLIC PANEL
Anion gap: 6 (ref 5–15)
BUN: 20 mg/dL (ref 8–23)
CO2: 18 mmol/L — ABNORMAL LOW (ref 22–32)
Calcium: 9.5 mg/dL (ref 8.9–10.3)
Chloride: 115 mmol/L — ABNORMAL HIGH (ref 98–111)
Creatinine, Ser: 0.92 mg/dL (ref 0.44–1.00)
GFR, Estimated: >60 mL/min (ref >60)
Glucose, Bld: 113 mg/dL — ABNORMAL HIGH (ref 70–99)
Potassium: 4 mmol/L (ref 3.5–5.1)
Sodium: 139 mmol/L (ref 135–145)

## 2020-11-09 LAB — T4, FREE: Free T4: 1.27 ng/dL — ABNORMAL HIGH (ref 0.61–1.12)

## 2020-11-09 LAB — URINE CULTURE: Culture: >=100000 — AB

## 2020-11-09 LAB — TSH: TSH: 0.325 u[IU]/mL — ABNORMAL LOW (ref 0.350–4.500)

## 2020-11-09 MED ORDER — OLANZAPINE 5 MG PO TABS
5.0000 mg | ORAL_TABLET | Freq: Two times a day (BID) | ORAL | Status: DC
  Administered 2020-11-09 – 2020-11-17 (×17): 5 mg via ORAL
  Filled 2020-11-09 (×18): qty 1

## 2020-11-09 MED ORDER — DIVALPROEX SODIUM 250 MG PO DR TAB
500.0000 mg | DELAYED_RELEASE_TABLET | Freq: Two times a day (BID) | ORAL | Status: DC
  Administered 2020-11-09 – 2020-11-17 (×17): 500 mg via ORAL
  Filled 2020-11-09 (×17): qty 2

## 2020-11-09 MED ORDER — VALBENAZINE TOSYLATE 40 MG PO CAPS
40.0000 mg | ORAL_CAPSULE | Freq: Every day | ORAL | Status: DC
  Administered 2020-11-09 – 2020-11-17 (×9): 40 mg via ORAL
  Filled 2020-11-09 (×9): qty 1

## 2020-11-09 NOTE — Progress Notes (Signed)
Patient complaining of feeling short of breath. O2 saturations 97-100% BP 141/45, MAP 75, RR in 20's when short of breath. Still sinus brady sometimes dipping to 30s. Encouraged patient to practice slowing his breathing down and to breath deeper and slower if possible.

## 2020-11-09 NOTE — Progress Notes (Signed)
PROGRESS NOTE    [name]  ZOX:[id number] DOB: [birth year] DOA: 11/07/2020 PCP: [facility]   Chief Complaint  Patient presents with   Altered Mental Status   Brief Narrative:   Karen Best is Karen Best 65 y.o. female with medical history significant for schizophrenia, history of CVA, hypertension, and tobacco abuse, presenting to the emergency department for evaluation of somnolence and dysarthria.  Patient is unable to contribute to the history and her emergency contacts cannot be reached.  Per report of EMS, the patient was noted by roommates to been yelling all throughout the day and then found slumped over in Karen Best chair.  She was found to be somnolent and dysarthric by EMS with blood pressure of 78 systolic.  She was given 2 mg of Narcan with no significant change in her condition reported.   ED Course: Upon arrival to the ED, patient is found to be afebrile, saturating 90s on room air, heart rate in the 60s to 81X, and systolic blood pressure in the low 100s.  EKG features sinus rhythm with LAFB and minimal ST elevation anteriorly.  Chest x-ray negative for acute cardiopulmonary disease.  Head CT with no acute abnormality.  Chemistry panel notable for BUN of 39, sodium 132, bicarbonate 18, and creatinine of 3.76.  CBC with microcytosis without anemia.  Lactic acid 3.5.  Ammonia 64.  Valproic acid level 20.  Patient was given 2 L of saline in the ED.  Assessment & Plan:   Principal Problem:   AKI (acute kidney injury) ([place]) Active Problems:   CVA (cerebral vascular accident) ([place])   Schizoaffective disorder, bipolar type ([place])   Acute encephalopathy  2. Acute encephalopathy  Concern for Psychosis - Patient was reportedly yelling throughout the day but then found slumped over and dysarthric  - No acute findings on head CT  - Ammonia is mildly elevated -> repeat was WNL - UDS negative - B12 wnl  - RPR nonreactive - delusions again  today, she reports  hallucinations, but doesn't describe these clearly - unclear baseline - psych c/s, appreciate recs  # Abnormal Thyroid Function Tests - TSH suppressed (not undetectable), free T4 mildly elevated - unclear significance -> follow repeat TSH, free T3/T4, may represent hyperthyroidism  1. Acute kidney injury  - baseline creatinine <1, presented with creatinine 3.76 - peaked at 4 - [place] without hydro - improved - Check UA ([place]) and urine chemistries, hold lisinopril and Advil, renally-dose medications, continue IVF hydration, and repeat serum chem panel     3. Schizophrenia  - She is somnolent in ED and sedating medications held on admission     4. Hypertension  Hypotension - SBP reportedly 70s with EMS, low 100s in ED - Hold antihypertensives initially - BP improved today   5. Hyponatremia - Serum sodium is 132 in setting of hypovolemia - Continue IVF hydration with NS and monitor   DVT prophylaxis: heparin  Code Status: full  Family Communication: none Disposition:   Status is: Inpatient  Remains inpatient appropriate because:Inpatient level of care appropriate due to severity of illness  Dispo: The patient is from:  group home              Anticipated d/c is to:  pending              Patient currently is not medically stable to d/c.   Difficult to place patient No       Consultants:  psych  Procedures:  none  Antimicrobials:  Anti-infectives (From admission, onward)    None          Subjective: Denies pain  Objective: Vitals:   11/08/20 2030 11/09/20 0006 11/09/20 0742 11/09/20 1100  BP: (!) 151/94 (!) 167/115 (!) 168/111 (!) 176/88  Pulse: 71 74 69 76  Resp: 16 20 16 18   Temp: 97.8 F (36.6 C) 97.8 F (36.6 C) 98.1 F (36.7 C) 97.7 F (36.5 C)  TempSrc: Oral Oral Oral Oral  SpO2: 100% 100% 100% 100%  Weight:      Height:        Intake/Output Summary (Last 24 hours) at 11/09/2020 1339 Last data filed at 11/09/2020 1147 Gross per 24 hour   Intake 2053.44 ml  Output 2550 ml  Net -496.56 ml   Filed Weights   11/07/20 2144 11/08/20 0504  Weight: 67 kg 68.5 kg    Examination:  General: No acute distress. Cardiovascular: RRR Lungs: unlabored Abdomen: Soft, nontender, nondistended  Neurological: moving all extremities, no focal deficits - delusions, nonsensical speech - talking about joyce myers again  Skin: Warm and dry. No rashes or lesions. Extremities: No clubbing or cyanosis. No edema.     Data Reviewed: I have personally reviewed following labs and imaging studies  CBC: Recent Labs  Lab 11/07/20 2127 11/07/20 2141 11/07/20 2359 11/08/20 0717 11/09/20 0236  WBC 8.6  --   --  7.6 6.5  NEUTROABS 3.2  --   --   --   --   HGB 13.5 15.3* 16.3* 14.0 11.7*  HCT 43.4 45.0 48.0* 45.1 37.2  MCV 75.2*  --   --  74.8* 73.4*  PLT 260  --   --  204 166    Basic Metabolic Panel: Recent Labs  Lab 11/07/20 2127 11/07/20 2141 11/07/20 2359 11/08/20 0717 11/09/20 0236  NA 132* 133* 133* 139 139  K 4.1 4.0 4.5 3.9 4.0  CL 103 106  --  113* 115*  CO2 18*  --   --  15* 18*  GLUCOSE 84 84  --  73 113*  BUN 39* 40*  --  34* 20  CREATININE 3.76* 4.00*  --  2.18* 0.92  CALCIUM 9.6  --   --  8.9 9.5    GFR: Estimated Creatinine Clearance: 60.1 mL/min (by C-G formula based on SCr of 0.92 mg/dL).  Liver Function Tests: Recent Labs  Lab 11/07/20 2127  AST 23  ALT 23  ALKPHOS 63  BILITOT 0.8  PROT 8.1  ALBUMIN 3.7    CBG: Recent Labs  Lab 11/07/20 2131  GLUCAP 85     Recent Results (from the past 240 hour(s))  Blood culture (routine x 2)     Status: None (Preliminary result)   Collection Time: 11/07/20 11:55 PM   Specimen: BLOOD RIGHT WRIST  Result Value Ref Range Status   Specimen Description BLOOD RIGHT WRIST  Final   Special Requests   Final    BOTTLES DRAWN AEROBIC AND ANAEROBIC Blood Culture adequate volume   Culture   Final    NO GROWTH 1 DAY Performed at [facility], [place] [address] [place], [place] [address]    Report Status PENDING  Incomplete  SARS CORONAVIRUS 2 (TAT 6-24 HRS) Nasopharyngeal Nasopharyngeal Swab     Status: None   Collection Time: 11/08/20 12:52 AM   Specimen: Nasopharyngeal Swab  Result Value Ref Range Status   SARS Coronavirus 2 NEGATIVE NEGATIVE Final    Comment: (NOTE)  SARS-CoV-2 target nucleic acids are NOT DETECTED.  The SARS-CoV-2 RNA is generally detectable in upper and lower respiratory specimens during the acute phase of infection. Negative results do not preclude SARS-CoV-2 infection, do not rule out co-infections with other pathogens, and should not be used as the sole basis for treatment or other patient management decisions. Negative results must be combined with clinical observations, patient history, and epidemiological information. The expected result is Negative.  Fact Sheet for Patients: SugarRoll.be  Fact Sheet for Healthcare Providers: https://www.woods-mathews.com/  This test is not yet approved or cleared by the [place] FDA and  has been authorized for detection and/or diagnosis of SARS-CoV-2 by FDA under an Emergency Use Authorization (EUA). This EUA will remain  in effect (meaning this test can be used) for the duration of the COVID-19 declaration under Se ction 564(b)(1) of the Act, 21 U.S.C. section 360bbb-3(b)(1), unless the authorization is terminated or revoked sooner.  Performed at [facility], [place] [address] [place], [place] [address]   Urine Culture     Status: Abnormal   Collection Time: 11/08/20  8:45 AM   Specimen: Urine, Clean Catch  Result Value Ref Range Status   Specimen Description URINE, CLEAN CATCH  Final   Special Requests NONE  Final   Culture (Donata Reddick)  Final    >=100,000 COLONIES/mL AEROCOCCUS SPECIES Standardized susceptibility testing for this organism is not available. Performed at [facility], [place] [address],  [place], [place] [address]    Report Status 11/09/2020 FINAL  Final         Radiology Studies: CT HEAD WO CONTRAST  Result Date: 11/07/2020 CLINICAL DATA:  Altered mental status EXAM: CT HEAD WITHOUT CONTRAST TECHNIQUE: Contiguous axial images were obtained from the base of the skull through the vertex without intravenous contrast. COMPARISON:  09/19/2020 FINDINGS: Brain: No evidence of acute infarction, hemorrhage, hydrocephalus, extra-axial collection or mass lesion/mass effect. Mild chronic white matter ischemic changes are noted. Vascular: No hyperdense vessel or unexpected calcification. Skull: Normal. Negative for fracture or focal lesion. Sinuses/Orbits: No acute finding. Other: None. IMPRESSION: Chronic white matter ischemic changes without acute abnormality. Electronically Signed   By: Inez Catalina M.D.   On: 11/07/2020 23:14   US RENAL  Result Date: 11/08/2020 CLINICAL DATA:  Acute kidney injury. EXAM: RENAL / URINARY TRACT ULTRASOUND COMPLETE COMPARISON:  None. FINDINGS: Right Kidney: Renal measurements: 9.2 x 5.7 x 5.9 cm = volume: 162 mL. Echogenicity within normal limits. No mass or hydronephrosis visualized. Left Kidney: Renal measurements: 11.0 x 5.6 x 6.3 cm = volume: 201 mL. Echogenicity within normal limits. No mass or hydronephrosis visualized. Bladder: Appears normal for degree of bladder distention. Ureteral jets could not be demonstrated on color Doppler evaluation of the bladder. Other: Incidental note of cholelithiasis. IMPRESSION: No evidence for hydronephrosis. Cholelithiasis. Electronically Signed   By: Misty Stanley M.D.   On: 11/08/2020 14:14   DG Chest Portable 1 View  Result Date: 11/07/2020 CLINICAL DATA:  Altered mental status EXAM: PORTABLE CHEST 1 VIEW COMPARISON:  10/01/2019 FINDINGS: Cardiac shadow is stable. No focal infiltrate or effusion is seen. No bony abnormality is noted. Mild interstitial scarring is again seen and stable. No other focal abnormality is  seen. IMPRESSION: No active disease. Electronically Signed   By: Inez Catalina M.D.   On: 11/07/2020 22:25        Scheduled Meds:  divalproex  500 mg Oral Q12H   feeding supplement  237 mL Oral BID BM  heparin  5,000 Units Subcutaneous Q8H   multivitamin with minerals  1 tablet Oral Daily   OLANZapine  5 mg Oral BID   pneumococcal 23 valent vaccine  0.5 mL Intramuscular Tomorrow-1000   valbenazine  40 mg Oral Daily   Continuous Infusions:     LOS: 1 day    Time spent: over 30 min    Fayrene Helper, MD [facility]   To contact the attending provider between 7A-7P or the covering provider during after hours 7P-7A, please log into the web site [email or web address] and access using universal [place] password for that web site. If you do not have the password, please call the hospital operator.  11/09/2020, 1:39 PM

## 2020-11-09 NOTE — Consult Note (Addendum)
[facility] Consult   Reason for Consult:  delusions, psychosis Referring Physician:  Cephus Slater, Lamonte Sakai Patient Identification: Karen Best MRN:  [id number] Principal Diagnosis: AKI (acute kidney injury) [facility]) Diagnosis:  Principal Problem:   AKI (acute kidney injury) ([place]) Active Problems:   CVA (cerebral vascular accident) ([place])   Schizoaffective disorder, bipolar type ([place])   Acute encephalopathy   Total Time spent with patient: 20 minutes  Subjective:   Karen Best is a 66 y.o. female patient admitted for acute encephalopathy.  HPI:    Pt is a pleasant 65 yo female w/ mhx schizoaffective disorder bipolar type, history of CVA, htn, tobacco abuse presenting to the ED for evaluation of somnolence and dysarthria. Patient seen at bedside this morning. Patient unable to identify why she is at hospital but suspects it is regarding her feet.   Pt states she is currently "calm, cool, collected". Pt states she has been in a "good mood". Pt primary concern is inability to get her medications. Pt states she has been unable to take any of her medications.  Pt states "they just aren't giving it to me". Pt states she has had very limited sleep and she would be able to if she would receive her medication. Pt is unclear what any of her medications are.   Pt denies racing thought but is very goal-directed towards getting her medications and "getting the children (soldiers) their money because it is only fair". Pt asked this Probation officer if this Probation officer would be able to speak with the president regarding this. Pt uncertain who the current president is but guessed it was "Trump". Pt states Jesus was source of these goals and that he told her not to let them "play me crazy". Pt only other concern was she had "heartburn all night long".  Pt denies SI/HI/VH.  Past Psychiatric History: Schizoaffective Bipolar type  Risk to Self:   no Risk to Others:   no Prior Inpatient Therapy:    yes Prior Outpatient Therapy:   yes  Past Medical History:  Past Medical History:  Diagnosis Date   Asthma    Bipolar 1 disorder ([place])    Fibromyalgia    Hypertension    Schizophrenia ([place])    Tobacco abuse     Past Surgical History:  Procedure Laterality Date   CESAREAN SECTION     TUBAL LIGATION     Family History:  Family History  Problem Relation Age of Onset   Liver cancer Neg Hx    Liver disease Neg Hx    Family Psychiatric  History: N/A Social History:  Social History   Substance and Sexual Activity  Alcohol Use Yes   Comment: occ     Social History   Substance and Sexual Activity  Drug Use Yes   Types: Cocaine   Comment: crack cocaine (last use 10/25/2018)    Social History   Socioeconomic History   Marital status: Legally Separated    Spouse name: Not on file   Number of children: Not on file   Years of education: Not on file   Highest education level: Not on file  Occupational History   Not on file  Tobacco Use   Smoking status: Every Day    Packs/day: 0.50    Types: Cigarettes   Smokeless tobacco: Never  Vaping Use   Vaping Use: Never used  Substance and Sexual Activity   Alcohol use: Yes    Comment: occ   Drug use: Yes  Types: Cocaine    Comment: crack cocaine (last use 10/25/2018)   Sexual activity: Yes    Birth control/protection: None  Other Topics Concern   Not on file  Social History Narrative   Not on file   Social Determinants of Health   Financial Resource Strain: Not on file  Food Insecurity: Not on file  Transportation Needs: Not on file  Physical Activity: Not on file  Stress: Not on file  Social Connections: Not on file   Additional Social History:    Allergies:  No Known Allergies  Labs:  Results for orders placed or performed during the hospital encounter of 11/07/20 (from the past 48 hour(s))  Protime-INR     Status: None   Collection Time: 11/07/20  9:27 PM  Result Value Ref Range   Prothrombin Time  13.7 11.4 - 15.2 seconds   INR 1.0 0.8 - 1.2    Comment: (NOTE) INR goal varies based on device and disease states. Performed at [facility], [place] [address], [place], [place] [address]   APTT     Status: Abnormal   Collection Time: 11/07/20  9:27 PM  Result Value Ref Range   aPTT 23 (L) 24 - 36 seconds    Comment: Performed at [facility] [address], [place], [place] [address]  CBC     Status: Abnormal   Collection Time: 11/07/20  9:27 PM  Result Value Ref Range   WBC 8.6 4.0 - 10.5 K/uL   RBC 5.77 (H) 3.87 - 5.11 MIL/uL   Hemoglobin 13.5 12.0 - 15.0 g/dL   HCT 43.4 36.0 - 46.0 %   MCV 75.2 (L) 80.0 - 100.0 fL   MCH 23.4 (L) 26.0 - 34.0 pg   MCHC 31.1 30.0 - 36.0 g/dL   RDW 16.9 (H) 11.5 - 15.5 %   Platelets 260 150 - 400 K/uL   nRBC 0.0 0.0 - 0.2 %    Comment: Performed at [facility] [address], [place], [place] [address]  Differential     Status: None   Collection Time: 11/07/20  9:27 PM  Result Value Ref Range   Neutrophils Relative % 37 %   Neutro Abs 3.2 1.7 - 7.7 K/uL   Lymphocytes Relative 47 %   Lymphs Abs 4.0 0.7 - 4.0 K/uL   Monocytes Relative 12 %   Monocytes Absolute 1.0 0.1 - 1.0 K/uL   Eosinophils Relative 4 %   Eosinophils Absolute 0.3 0.0 - 0.5 K/uL   Basophils Relative 0 %   Basophils Absolute 0.0 0.0 - 0.1 K/uL   Immature Granulocytes 0 %   Abs Immature Granulocytes 0.02 0.00 - 0.07 K/uL    Comment: Performed at [facility] [address], [place], [place] [address]  Comprehensive metabolic panel     Status: Abnormal   Collection Time: 11/07/20  9:27 PM  Result Value Ref Range   Sodium 132 (L) 135 - 145 mmol/L   Potassium 4.1 3.5 - 5.1 mmol/L   Chloride 103 98 - 111 mmol/L   CO2 18 (L) 22 - 32 mmol/L   Glucose, Bld 84 70 - 99 mg/dL    Comment: Glucose reference range applies only to samples taken after fasting for at least 8 hours.   BUN 39 (H) 8 - 23 mg/dL   Creatinine, Ser 3.76 (H) 0.44 - 1.00 mg/dL    Calcium 9.6 8.9 - 10.3 mg/dL   Total Protein 8.1 6.5 -  8.1 g/dL   Albumin 3.7 3.5 - 5.0 g/dL   AST 23 15 - 41 U/L   ALT 23 0 - 44 U/L   Alkaline Phosphatase 63 38 - 126 U/L   Total Bilirubin 0.8 0.3 - 1.2 mg/dL   GFR, Estimated 13 (L) >60 mL/min    Comment: (NOTE) Calculated using the CKD-EPI Creatinine Equation (2021)    Anion gap 11 5 - 15    Comment: Performed at [facility] [address], [place], [place] [address]  CBG monitoring, ED     Status: None   Collection Time: 11/07/20  9:31 PM  Result Value Ref Range   Glucose-Capillary 85 70 - 99 mg/dL    Comment: Glucose reference range applies only to samples taken after fasting for at least 8 hours.  I-stat chem 8, ED     Status: Abnormal   Collection Time: 11/07/20  9:41 PM  Result Value Ref Range   Sodium 133 (L) 135 - 145 mmol/L   Potassium 4.0 3.5 - 5.1 mmol/L   Chloride 106 98 - 111 mmol/L   BUN 40 (H) 8 - 23 mg/dL   Creatinine, Ser 4.00 (H) 0.44 - 1.00 mg/dL   Glucose, Bld 84 70 - 99 mg/dL    Comment: Glucose reference range applies only to samples taken after fasting for at least 8 hours.   Calcium, Ion 1.12 (L) 1.15 - 1.40 mmol/L   TCO2 18 (L) 22 - 32 mmol/L   Hemoglobin 15.3 (H) 12.0 - 15.0 g/dL   HCT 45.0 36.0 - 46.0 %  Lactic acid, plasma     Status: Abnormal   Collection Time: 11/07/20  9:53 PM  Result Value Ref Range   Lactic Acid, Venous 3.5 (HH) 0.5 - 1.9 mmol/L    Comment: CRITICAL RESULT CALLED TO, READ BACK BY AND VERIFIED WITH: Carolin Guernsey, RN. @0109  [id number] BLANKENSHIP R.  Performed at [facility], [place] [address], [place], [place] [address]   Ammonia     Status: Abnormal   Collection Time: 11/07/20  9:54 PM  Result Value Ref Range   Ammonia 64 (H) 9 - 35 umol/L    Comment: Performed at [facility] [address] [place], [place] [address]  Blood culture (routine x 2)     Status: None (Preliminary result)   Collection Time: 11/07/20 11:55 PM   Specimen: BLOOD RIGHT WRIST   Result Value Ref Range   Specimen Description BLOOD RIGHT WRIST    Special Requests      BOTTLES DRAWN AEROBIC AND ANAEROBIC Blood Culture adequate volume   Culture      NO GROWTH 1 DAY Performed at [facility], [place] [address] [place], [place] [address]    Report Status PENDING   Valproic acid level     Status: Abnormal   Collection Time: 11/07/20 11:57 PM  Result Value Ref Range   Valproic Acid Lvl 20 (L) 50.0 - 100.0 ug/mL    Comment: Performed at [facility] [address] [place], [place] [address]  Troponin I (High Sensitivity)     Status: None   Collection Time: 11/07/20 11:57 PM  Result Value Ref Range   Troponin I (High Sensitivity) 9 <18 ng/L    Comment: (NOTE) Elevated high sensitivity troponin I (hsTnI) values and significant  changes across serial measurements may suggest ACS but many other  chronic and acute conditions are known to elevate hsTnI results.  Refer  to the "Links" section for chest pain algorithms and additional  guidance. Performed at [facility], [place] [address], [place], [place] [address]   I-Stat venous blood gas, [facility] ED)     Status: Abnormal   Collection Time: 11/07/20 11:59 PM  Result Value Ref Range   pH, Ven 7.291 7.250 - 7.430   pCO2, Ven 37.1 (L) 44.0 - 60.0 mmHg   pO2, Ven 42.0 32.0 - 45.0 mmHg   Bicarbonate 17.9 (L) 20.0 - 28.0 mmol/L   TCO2 19 (L) 22 - 32 mmol/L   O2 Saturation 72.0 %   Acid-base deficit 8.0 (H) 0.0 - 2.0 mmol/L   Sodium 133 (L) 135 - 145 mmol/L   Potassium 4.5 3.5 - 5.1 mmol/L   Calcium, Ion 1.13 (L) 1.15 - 1.40 mmol/L   HCT 48.0 (H) 36.0 - 46.0 %   Hemoglobin 16.3 (H) 12.0 - 15.0 g/dL   Sample type VENOUS   SARS CORONAVIRUS 2 (TAT 6-24 HRS) Nasopharyngeal Nasopharyngeal Swab     Status: None   Collection Time: 11/08/20 12:52 AM   Specimen: Nasopharyngeal Swab  Result Value Ref Range   SARS Coronavirus 2 NEGATIVE NEGATIVE    Comment: (NOTE) SARS-CoV-2 target nucleic acids are NOT  DETECTED.  The SARS-CoV-2 RNA is generally detectable in upper and lower respiratory specimens during the acute phase of infection. Negative results do not preclude SARS-CoV-2 infection, do not rule out co-infections with other pathogens, and should not be used as the sole basis for treatment or other patient management decisions. Negative results must be combined with clinical observations, patient history, and epidemiological information. The expected result is Negative.  Fact Sheet for Patients: SugarRoll.be  Fact Sheet for Healthcare Providers: https://www.woods-mathews.com/  This test is not yet approved or cleared by the [place] FDA and  has been authorized for detection and/or diagnosis of SARS-CoV-2 by FDA under an Emergency Use Authorization (EUA). This EUA will remain  in effect (meaning this test can be used) for the duration of the COVID-19 declaration under Se ction 564(b)(1) of the Act, 21 U.S.C. section 360bbb-3(b)(1), unless the authorization is terminated or revoked sooner.  Performed at [facility], [place] [address], [place], [place] [address]   Lactic acid, plasma     Status: None   Collection Time: 11/08/20  1:00 AM  Result Value Ref Range   Lactic Acid, Venous 1.5 0.5 - 1.9 mmol/L    Comment: Performed at [facility] [address] [place], [place] [address]  Rapid urine drug screen (hospital performed)     Status: None   Collection Time: 11/08/20  1:00 AM  Result Value Ref Range   Opiates NONE DETECTED NONE DETECTED   Cocaine NONE DETECTED NONE DETECTED   Benzodiazepines NONE DETECTED NONE DETECTED   Amphetamines NONE DETECTED NONE DETECTED   Tetrahydrocannabinol NONE DETECTED NONE DETECTED   Barbiturates NONE DETECTED NONE DETECTED    Comment: (NOTE) DRUG SCREEN FOR MEDICAL PURPOSES ONLY.  IF CONFIRMATION IS NEEDED FOR ANY PURPOSE, NOTIFY LAB WITHIN 5 DAYS.  LOWEST DETECTABLE LIMITS FOR  URINE DRUG SCREEN Drug Class                     Cutoff (ng/mL) Amphetamine and metabolites    1000 Barbiturate and metabolites    200 Benzodiazepine                 329 Tricyclics and metabolites     300 Opiates  and metabolites        300 Cocaine and metabolites        300 THC                            50 Performed at [facility], [place] [address], [place], [place] [address]   Urinalysis, Routine w reflex microscopic Urine, Clean Catch     Status: Abnormal   Collection Time: 11/08/20  1:00 AM  Result Value Ref Range   Color, Urine YELLOW YELLOW   APPearance HAZY (A) CLEAR   Specific Gravity, Urine 1.006 1.005 - 1.030   pH 7.0 5.0 - 8.0   Glucose, UA NEGATIVE NEGATIVE mg/dL   Hgb urine dipstick NEGATIVE NEGATIVE   Bilirubin Urine NEGATIVE NEGATIVE   Ketones, ur 5 (A) NEGATIVE mg/dL   Protein, ur NEGATIVE NEGATIVE mg/dL   Nitrite NEGATIVE NEGATIVE   Leukocytes,Ua NEGATIVE NEGATIVE    Comment: Performed at [facility] [address] [place], [place] [address]  Troponin I (High Sensitivity)     Status: None   Collection Time: 11/08/20  1:00 AM  Result Value Ref Range   Troponin I (High Sensitivity) 9 <18 ng/L    Comment: (NOTE) Elevated high sensitivity troponin I (hsTnI) values and significant  changes across serial measurements may suggest ACS but many other  chronic and acute conditions are known to elevate hsTnI results.  Refer to the "Links" section for chest pain algorithms and additional  guidance. Performed at [facility], [place] [address], [place], [place] [address]   TSH     Status: Abnormal   Collection Time: 11/08/20  1:00 AM  Result Value Ref Range   TSH 0.208 (L) 0.350 - 4.500 uIU/mL    Comment: Performed by a 3rd Generation assay with a functional sensitivity of <=0.01 uIU/mL. Performed at [facility], [place] [address] [place] [address] [place], [place] [address]   Vitamin B12     Status: None   Collection Time: 11/08/20  1:00 AM  Result Value Ref  Range   Vitamin B-12 872 180 - 914 pg/mL    Comment: (NOTE) This assay is not validated for testing neonatal or myeloproliferative syndrome specimens for Vitamin B12 levels. Performed at [facility], [place] [address], [place], [place] [address]   RPR     Status: None   Collection Time: 11/08/20  1:00 AM  Result Value Ref Range   RPR Ser Ql NON REACTIVE NON REACTIVE    Comment: Performed at [facility], [place] [address] [place], [place] [address]  HIV Antibody (routine testing w rflx)     Status: None   Collection Time: 11/08/20  1:00 AM  Result Value Ref Range   HIV Screen 4th Generation wRfx Non Reactive Non Reactive    Comment: Performed at [facility], [place] [address] [place], [place] [address]  Basic metabolic panel     Status: Abnormal   Collection Time: 11/08/20  7:17 AM  Result Value Ref Range   Sodium 139 135 - 145 mmol/L   Potassium 3.9 3.5 - 5.1 mmol/L   Chloride 113 (H) 98 - 111 mmol/L   CO2 15 (L) 22 - 32 mmol/L   Glucose, Bld 73 70 - 99 mg/dL    Comment: Glucose reference range applies only to samples taken after fasting for at least 8 hours.   BUN 34 (H) 8 -  23 mg/dL   Creatinine, Ser 2.18 (H) 0.44 - 1.00 mg/dL    Comment: DELTA CHECK NOTED   Calcium 8.9 8.9 - 10.3 mg/dL   GFR, Estimated 25 (L) >60 mL/min    Comment: (NOTE) Calculated using the CKD-EPI Creatinine Equation (2021)    Anion gap 11 5 - 15    Comment: Performed at [facility] [address], [place], [place] [address]  CBC     Status: Abnormal   Collection Time: 11/08/20  7:17 AM  Result Value Ref Range   WBC 7.6 4.0 - 10.5 K/uL   RBC 6.03 (H) 3.87 - 5.11 MIL/uL   Hemoglobin 14.0 12.0 - 15.0 g/dL   HCT 45.1 36.0 - 46.0 %   MCV 74.8 (L) 80.0 - 100.0 fL   MCH 23.2 (L) 26.0 - 34.0 pg   MCHC 31.0 30.0 - 36.0 g/dL   RDW 17.5 (H) 11.5 - 15.5 %   Platelets 204 150 - 400 K/uL    Comment: REPEATED TO VERIFY   nRBC 0.0 0.0 - 0.2 %    Comment: Performed at [facility], [place] [address], [place], [place] [address]  Urine Culture     Status: None (Preliminary result)   Collection Time: 11/08/20  8:45 AM   Specimen: Urine, Clean Catch  Result Value Ref Range   Specimen Description URINE, CLEAN CATCH    Special Requests NONE    Culture      CULTURE REINCUBATED FOR BETTER GROWTH Performed at [facility], [place] [address] [place], [place] [address]    Report Status PENDING   Ammonia     Status: None   Collection Time: 11/08/20 10:00 AM  Result Value Ref Range   Ammonia 35 9 - 35 umol/L    Comment: Performed at [facility], [place] [address] [place], [place] [address]  Sodium, urine, random     Status: None   Collection Time: 11/08/20  1:12 PM  Result Value Ref Range   Sodium, Ur 31 mmol/L    Comment: Performed at [facility] [address], [place], [place] [address]  Creatinine, urine, random     Status: None   Collection Time: 11/08/20  1:12 PM  Result Value Ref Range   Creatinine, Urine 38.42 mg/dL    Comment: Performed at [facility] [address], [place], [place] [address]  T4, free     Status: Abnormal   Collection Time: 11/08/20  6:37 PM  Result Value Ref Range   Free T4 1.27 (H) 0.61 - 1.12 ng/dL    Comment: (NOTE) Biotin ingestion may interfere with free T4 tests. If the results are inconsistent with the TSH level, previous test results, or the clinical presentation, then consider biotin interference. If needed, order repeat testing after stopping biotin. Performed at [facility], [place] [address], [place], [place] [address]   Basic metabolic panel     Status: Abnormal   Collection Time: 11/09/20  2:36 AM  Result Value Ref Range   Sodium 139 135 - 145 mmol/L   Potassium 4.0 3.5 - 5.1 mmol/L   Chloride 115 (H) 98 - 111 mmol/L   CO2 18 (L) 22 - 32 mmol/L   Glucose, Bld 113 (H) 70 - 99 mg/dL    Comment: Glucose reference range applies only to samples taken after fasting for at least 8 hours.   BUN 20 8  - 23 mg/dL  Creatinine, Ser 0.92 0.44 - 1.00 mg/dL    Comment: DELTA CHECK NOTED   Calcium 9.5 8.9 - 10.3 mg/dL   GFR, Estimated >60 >60 mL/min    Comment: (NOTE) Calculated using the CKD-EPI Creatinine Equation (2021)    Anion gap 6 5 - 15    Comment: Performed at [facility] [address], [place], [place] [address]  CBC     Status: Abnormal   Collection Time: 11/09/20  2:36 AM  Result Value Ref Range   WBC 6.5 4.0 - 10.5 K/uL   RBC 5.07 3.87 - 5.11 MIL/uL   Hemoglobin 11.7 (L) 12.0 - 15.0 g/dL   HCT 37.2 36.0 - 46.0 %   MCV 73.4 (L) 80.0 - 100.0 fL   MCH 23.1 (L) 26.0 - 34.0 pg   MCHC 31.5 30.0 - 36.0 g/dL   RDW 16.6 (H) 11.5 - 15.5 %   Platelets 247 150 - 400 K/uL   nRBC 0.0 0.0 - 0.2 %    Comment: Performed at [facility], [place] [address] [place], [place] [address]    Current Facility-Administered Medications  Medication Dose Route Frequency Provider Last Rate Last Admin   acetaminophen (TYLENOL) tablet 650 mg  650 mg Oral Q6H PRN Opyd, Ilene Qua, MD       Or   acetaminophen (TYLENOL) suppository 650 mg  650 mg Rectal Q6H PRN Opyd, Ilene Qua, MD       clonazePAM (KLONOPIN) tablet 0.5 mg  0.5 mg Oral BID PRN Opyd, Ilene Qua, MD       feeding supplement (ENSURE ENLIVE / ENSURE PLUS) liquid 237 mL  237 mL Oral BID BM Elodia [place]., MD   237 mL at 11/08/20 1523   heparin injection 5,000 Units  5,000 Units Subcutaneous Q8H Opyd, Ilene Qua, MD   5,000 Units at 11/09/20 2778   multivitamin with minerals tablet 1 tablet  1 tablet Oral Daily Elodia [place]., MD   1 tablet at 11/08/20 1217   ondansetron (ZOFRAN) tablet 4 mg  4 mg Oral Q6H PRN Opyd, Ilene Qua, MD       Or   ondansetron (ZOFRAN) injection 4 mg  4 mg Intravenous Q6H PRN Opyd, Ilene Qua, MD       pneumococcal 23 valent vaccine (PNEUMOVAX-23) injection 0.5 mL  0.5 mL Intramuscular Tomorrow-1000 Elodia [place]., MD           Psychiatric Specialty Exam:  Presentation  General  Appearance: Casual  Eye Contact:Good  Speech:Clear and Coherent  Speech Volume:Decreased   Mood and Affect  Mood:Euphoric  Affect:Constricted   Thought Process  Thought Processes:Goal Directed  Descriptions of Associations:Loose  Orientation:Full (Time, Place and Person)  Thought Content:Obsessions  History of Schizophrenia/Schizoaffective disorder:Yes  Duration of Psychotic Symptoms:Less than six months  Hallucinations:Hallucinations: Auditory Description of Auditory Hallucinations: Jesus speaks to patient  Ideas of Reference:None  Suicidal Thoughts:Suicidal Thoughts: No  Homicidal Thoughts:Homicidal Thoughts: No   Sensorium  Memory:Immediate Fair; Recent Fair; Remote Fair  Judgment:Fair  Insight:Fair   Executive Functions  Concentration:Fair  Attention Span:Fair  [place]   Psychomotor Activity  Psychomotor Activity:Psychomotor Activity: Psychomotor Retardation   Assets  Assets:Desire for Improvement   Sleep  Sleep:Sleep: Poor   Physical Exam: Physical Exam ROS Blood pressure (!) 168/111, pulse 69, temperature 98.1 F (36.7 C), temperature source Oral, resp. rate 16, height 5\' 5"  (1.651 m), weight 68.5 kg, SpO2 100 %.  Body mass index is 25.13 kg/m.  Treatment Plan Summary: Pt is 65 yo w/ hx of schizoaffective bipolar type admitted for acute encephalopathy and AKI. Auditory hallucination were not previously noted last [place] visit in May 2022. High energy, insomnia, and talkativeness may point to manic episode. Will re-evaluate after patient's medication resumed.    Schizoaffective Disorder, Bipolar Type -Recommend repeat TSH/free T4 and potentially thyroid scan because of elevated free T4 and low TSH  -Zyprexa 5 mg bid for schizoaffective disorder -Depakote 500 mg bid for schizoaffective disorder -Valbenazine 40 mg qhs for hx of tardive dyskinesia -Recommend TOC consult to evaluate home  situation -Will re-evaluate Monday for progress  Disposition: No evidence of imminent risk to self or others at present.    Thank you for the consult France Ravens, MD PGY1 Psychiatry Resident 11/09/2020 9:58 AM

## 2020-11-10 DIAGNOSIS — E440 Moderate protein-calorie malnutrition: Secondary | ICD-10-CM | POA: Insufficient documentation

## 2020-11-10 DIAGNOSIS — N179 Acute kidney failure, unspecified: Secondary | ICD-10-CM | POA: Diagnosis not present

## 2020-11-10 LAB — COMPREHENSIVE METABOLIC PANEL
ALT: 18 U/L (ref 0–44)
AST: 21 U/L (ref 15–41)
Albumin: 2.8 g/dL — ABNORMAL LOW (ref 3.5–5.0)
Alkaline Phosphatase: 60 U/L (ref 38–126)
Anion gap: 6 (ref 5–15)
BUN: 13 mg/dL (ref 8–23)
CO2: 21 mmol/L — ABNORMAL LOW (ref 22–32)
Calcium: 9.8 mg/dL (ref 8.9–10.3)
Chloride: 110 mmol/L (ref 98–111)
Creatinine, Ser: 0.7 mg/dL (ref 0.44–1.00)
GFR, Estimated: >60 mL/min (ref >60)
Glucose, Bld: 91 mg/dL (ref 70–99)
Potassium: 4 mmol/L (ref 3.5–5.1)
Sodium: 137 mmol/L (ref 135–145)
Total Bilirubin: 0.4 mg/dL (ref 0.3–1.2)
Total Protein: 6.6 g/dL (ref 6.5–8.1)

## 2020-11-10 LAB — CBC WITH DIFFERENTIAL/PLATELET
Abs Immature Granulocytes: 0.03 10*3/uL (ref 0.00–0.07)
Basophils Absolute: 0 10*3/uL (ref 0.0–0.1)
Basophils Relative: 0 %
Eosinophils Absolute: 0.6 10*3/uL — ABNORMAL HIGH (ref 0.0–0.5)
Eosinophils Relative: 7 %
HCT: 36.9 % (ref 36.0–46.0)
Hemoglobin: 11.9 g/dL — ABNORMAL LOW (ref 12.0–15.0)
Immature Granulocytes: 0 %
Lymphocytes Relative: 54 %
Lymphs Abs: 4.4 10*3/uL — ABNORMAL HIGH (ref 0.7–4.0)
MCH: 23.2 pg — ABNORMAL LOW (ref 26.0–34.0)
MCHC: 32.2 g/dL (ref 30.0–36.0)
MCV: 72.1 fL — ABNORMAL LOW (ref 80.0–100.0)
Monocytes Absolute: 0.7 10*3/uL (ref 0.1–1.0)
Monocytes Relative: 9 %
Neutro Abs: 2.5 10*3/uL (ref 1.7–7.7)
Neutrophils Relative %: 30 %
Platelets: 203 10*3/uL (ref 150–400)
RBC: 5.12 MIL/uL — ABNORMAL HIGH (ref 3.87–5.11)
RDW: 16.1 % — ABNORMAL HIGH (ref 11.5–15.5)
WBC: 8.3 10*3/uL (ref 4.0–10.5)
nRBC: 0 % (ref 0.0–0.2)

## 2020-11-10 LAB — MICROALBUMIN / CREATININE URINE RATIO
Creatinine, Urine: <4.2 mg/dL
Microalb, Ur: 40.1 ug/mL — ABNORMAL HIGH

## 2020-11-10 LAB — MAGNESIUM: Magnesium: 1.7 mg/dL (ref 1.7–2.4)

## 2020-11-10 LAB — T3, FREE: T3, Free: 3.2 pg/mL (ref 2.0–4.4)

## 2020-11-10 LAB — PHOSPHORUS: Phosphorus: 2.3 mg/dL — ABNORMAL LOW (ref 2.5–4.6)

## 2020-11-10 NOTE — Progress Notes (Signed)
   11/10/20 2300  Clinical Encounter Type  Visited With Patient  Visit Type Spiritual support  Referral From Nurse;Patient  Consult/Referral To Chaplain  Spiritual Encounters  Spiritual Needs Prayer  Ms. [name] nurse paged, and stated Ms. [name] requested prayer.  She explained what she wanted to pray for, and we prayed for peace of mind, body, and spirit.   We had an enjoyable conversation, and she was grateful for the prayer.  Chaplain Izick Gasbarro [place] [phone]

## 2020-11-10 NOTE — Progress Notes (Signed)
PROGRESS NOTE    JAZZMON PRINDLE  DUK:[id number] DOB: Oct 19, 1955 DOA: 11/07/2020 PCP: [facility]   Chief Complaint  Patient presents with   Altered Mental Status   Brief Narrative:   BABETTE STUM is Malaiyah Achorn 65 y.o. female with medical history significant for schizophrenia, history of CVA, hypertension, and tobacco abuse, presenting to the emergency department for evaluation of somnolence and dysarthria.  Patient is unable to contribute to the history and her emergency contacts cannot be reached.  Per report of EMS, the patient was noted by roommates to been yelling all throughout the day and then found slumped over in [place] Mackie chair.  She was found to be somnolent and dysarthric by EMS with blood pressure of 78 systolic.  She was given 2 mg of Narcan with no significant change in her condition reported.   ED Course: Upon arrival to the ED, patient is found to be afebrile, saturating 90s on room air, heart rate in the 60s to 37S, and systolic blood pressure in the low 100s.  EKG features sinus rhythm with LAFB and minimal ST elevation anteriorly.  Chest x-ray negative for acute cardiopulmonary disease.  Head CT with no acute abnormality.  Chemistry panel notable for BUN of 39, sodium 132, bicarbonate 18, and creatinine of 3.76.  CBC with microcytosis without anemia.  Lactic acid 3.5.  Ammonia 64.  Valproic acid level 20.  Patient was given 2 L of saline in the ED.  Assessment & Plan:   Principal Problem:   AKI (acute kidney injury) ([place]) Active Problems:   CVA (cerebral vascular accident) ([place])   Schizoaffective disorder, bipolar type ([place])   Acute encephalopathy   Malnutrition of moderate degree  2. Acute encephalopathy  Concern for Psychosis - Patient was reportedly yelling throughout the day but then found slumped over and dysarthric  - No acute findings on head CT  - Ammonia is mildly elevated -> repeat was WNL - UDS negative - B12 wnl  - RPR nonreactive -  delusions again  today, she reports hallucinations, but doesn't describe these clearly - unclear baseline - psych c/s, appreciate recs - zyprexa, depakote, valbenazine - recommended TOC to evaluate home situation  # Abnormal Thyroid Function Tests - TSH suppressed (not undetectable), free T4 mildly elevated - unclear significance  - repeat labs show again mildly suppressed TSH with mildly elevated free t4 - normal free T3 - with only mildly suppressed TSH, not totally convinced this is hyperthyroidism, will repeat labs, workup further if persistent, consider informal discussion with endocrine monday  1. Acute kidney injury  - baseline creatinine <1, presented with creatinine 3.76 - peaked at 4 - [place] without hydro - improved - Check UA (bland) and urine chemistries, hold lisinopril and Advil, renally-dose medications, continue IVF hydration, and repeat serum chem panel     3. Schizophrenia  - She is somnolent in ED and sedating medications held on admission     4. Hypertension  Hypotension - SBP reportedly 70s with EMS, low 100s in ED - Hold antihypertensives initially - BP improved today   5. Hyponatremia - Serum sodium is 132 in setting of hypovolemia - Continue IVF hydration with NS and monitor   DVT prophylaxis: heparin  Code Status: full  Family Communication: none Disposition:   Status is: Inpatient  Remains inpatient appropriate because:Inpatient level of care appropriate due to severity of illness  Dispo: The patient is from:  group home  Anticipated d/c is to:  pending              Patient currently is not medically stable to d/c.   Difficult to place patient No       Consultants:  psych  Procedures:  none  Antimicrobials:  Anti-infectives (From admission, onward)    None          Subjective: Nonsensical speech   Objective: Vitals:   11/09/20 2127 11/10/20 0012 11/10/20 0505 11/10/20 1350  BP: (!) 172/80 (!) 160/92 140/79 (!)  153/76  Pulse:    64  Resp:  17 15 15   Temp:  (!) 97.3 F (36.3 C) 97.7 F (36.5 C) 98.4 F (36.9 C)  TempSrc:  Oral Oral Oral  SpO2:    100%  Weight:   71 kg   Height:        Intake/Output Summary (Last 24 hours) at 11/10/2020 1844 Last data filed at 11/10/2020 0859 Gross per 24 hour  Intake 360 ml  Output 1000 ml  Net -640 ml   Filed Weights   11/07/20 2144 11/08/20 0504 11/10/20 0505  Weight: 67 kg 68.5 kg 71 kg    Examination:  General: No acute distress. Cardiovascular: Heart sounds show [place] regular rate, and rhythm.  Lungs: Clear to auscultation bilaterally  Abdomen: Soft, nontender, nondistended Neurological: Alert , nonsensical speech. Moves all extremities 4 . Cranial nerves II through XII grossly intact. Skin: Warm and dry. No rashes or lesions. Extremities: No clubbing or cyanosis. No edema.     Data Reviewed: I have personally reviewed following labs and imaging studies  CBC: Recent Labs  Lab 11/07/20 2127 11/07/20 2141 11/07/20 2359 11/08/20 0717 11/09/20 0236 11/10/20 0223  WBC 8.6  --   --  7.6 6.5 8.3  NEUTROABS 3.2  --   --   --   --  2.5  HGB 13.5 15.3* 16.3* 14.0 11.7* 11.9*  HCT 43.4 45.0 48.0* 45.1 37.2 36.9  MCV 75.2*  --   --  74.8* 73.4* 72.1*  PLT 260  --   --  204 247 025    Basic Metabolic Panel: Recent Labs  Lab 11/07/20 2127 11/07/20 2141 11/07/20 2359 11/08/20 0717 11/09/20 0236 11/10/20 0223  NA 132* 133* 133* 139 139 137  K 4.1 4.0 4.5 3.9 4.0 4.0  CL 103 106  --  113* 115* 110  CO2 18*  --   --  15* 18* 21*  GLUCOSE 84 84  --  73 113* 91  BUN 39* 40*  --  34* 20 13  CREATININE 3.76* 4.00*  --  2.18* 0.92 0.70  CALCIUM 9.6  --   --  8.9 9.5 9.8  MG  --   --   --   --   --  1.7  PHOS  --   --   --   --   --  2.3*    GFR: Estimated Creatinine Clearance: 70.2 mL/min (by C-G formula based on SCr of 0.7 mg/dL).  Liver Function Tests: Recent Labs  Lab 11/07/20 2127 11/10/20 0223  AST 23 21  ALT 23 18   ALKPHOS 63 60  BILITOT 0.8 0.4  PROT 8.1 6.6  ALBUMIN 3.7 2.8*    CBG: Recent Labs  Lab 11/07/20 2131  GLUCAP 85     Recent Results (from the past 240 hour(s))  Blood culture (routine x 2)     Status: None (Preliminary result)   Collection Time: 11/07/20 11:55 PM  Specimen: BLOOD RIGHT WRIST  Result Value Ref Range Status   Specimen Description BLOOD RIGHT WRIST  Final   Special Requests   Final    BOTTLES DRAWN AEROBIC AND ANAEROBIC Blood Culture adequate volume   Culture   Final    NO GROWTH 2 DAYS Performed at [facility], [address], [place], [place] [address]    Report Status PENDING  Incomplete  SARS CORONAVIRUS 2 (TAT 6-24 HRS) Nasopharyngeal Nasopharyngeal Swab     Status: None   Collection Time: 11/08/20 12:52 AM   Specimen: Nasopharyngeal Swab  Result Value Ref Range Status   SARS Coronavirus 2 NEGATIVE NEGATIVE Final    Comment: (NOTE) SARS-CoV-2 target nucleic acids are NOT DETECTED.  The SARS-CoV-2 RNA is generally detectable in upper and lower respiratory specimens during the acute phase of infection. Negative results do not preclude SARS-CoV-2 infection, do not rule out co-infections with other pathogens, and should not be used as the sole basis for treatment or other patient management decisions. Negative results must be combined with clinical observations, patient history, and epidemiological information. The expected result is Negative.  Fact Sheet for Patients: SugarRoll.be  Fact Sheet for Healthcare Providers: https://www.woods-mathews.com/  This test is not yet approved or cleared by the [place] FDA and  has been authorized for detection and/or diagnosis of SARS-CoV-2 by FDA under an Emergency Use Authorization (EUA). This EUA will remain  in effect (meaning this test can be used) for the duration of the COVID-19 declaration under Se ction 564(b)(1) of the Act, 21 U.S.C. section  360bbb-3(b)(1), unless the authorization is terminated or revoked sooner.  Performed at [facility], [place] [address], [place], [place] [address]   Urine Culture     Status: Abnormal   Collection Time: 11/08/20  8:45 AM   Specimen: Urine, Clean Catch  Result Value Ref Range Status   Specimen Description URINE, CLEAN CATCH  Final   Special Requests NONE  Final   Culture (Thais Silberstein)  Final    >=100,000 COLONIES/mL AEROCOCCUS SPECIES Standardized susceptibility testing for this organism is not available. Performed at [facility], [place] [address] [place], [place] [address]    Report Status 11/09/2020 FINAL  Final         Radiology Studies: No results found.      Scheduled Meds:  divalproex  500 mg Oral Q12H   feeding supplement  237 mL Oral BID BM   heparin  5,000 Units Subcutaneous Q8H   multivitamin with minerals  1 tablet Oral Daily   OLANZapine  5 mg Oral BID   pneumococcal 23 valent vaccine  0.5 mL Intramuscular Tomorrow-1000   valbenazine  40 mg Oral Daily   Continuous Infusions:     LOS: 2 days    Time spent: over 30 min    Fayrene Helper, MD [facility]   To contact the attending provider between 7A-7P or the covering provider during after hours 7P-7A, please log into the web site [email or web address] and access using universal [place] password for that web site. If you do not have the password, please call the hospital operator.  11/10/2020, 6:44 PM

## 2020-11-11 DIAGNOSIS — N179 Acute kidney failure, unspecified: Secondary | ICD-10-CM | POA: Diagnosis not present

## 2020-11-11 LAB — COMPREHENSIVE METABOLIC PANEL
ALT: 18 U/L (ref 0–44)
AST: 19 U/L (ref 15–41)
Albumin: 2.8 g/dL — ABNORMAL LOW (ref 3.5–5.0)
Alkaline Phosphatase: 66 U/L (ref 38–126)
Anion gap: 5 (ref 5–15)
BUN: 11 mg/dL (ref 8–23)
CO2: 23 mmol/L (ref 22–32)
Calcium: 9.7 mg/dL (ref 8.9–10.3)
Chloride: 105 mmol/L (ref 98–111)
Creatinine, Ser: 0.7 mg/dL (ref 0.44–1.00)
GFR, Estimated: >60 mL/min (ref >60)
Glucose, Bld: 107 mg/dL — ABNORMAL HIGH (ref 70–99)
Potassium: 3.8 mmol/L (ref 3.5–5.1)
Sodium: 133 mmol/L — ABNORMAL LOW (ref 135–145)
Total Bilirubin: 0.4 mg/dL (ref 0.3–1.2)
Total Protein: 6.6 g/dL (ref 6.5–8.1)

## 2020-11-11 LAB — CBC WITH DIFFERENTIAL/PLATELET
Abs Immature Granulocytes: 0.02 10*3/uL (ref 0.00–0.07)
Basophils Absolute: 0 10*3/uL (ref 0.0–0.1)
Basophils Relative: 0 %
Eosinophils Absolute: 0.6 10*3/uL — ABNORMAL HIGH (ref 0.0–0.5)
Eosinophils Relative: 10 %
HCT: 39.4 % (ref 36.0–46.0)
Hemoglobin: 12.4 g/dL (ref 12.0–15.0)
Immature Granulocytes: 0 %
Lymphocytes Relative: 47 %
Lymphs Abs: 2.9 10*3/uL (ref 0.7–4.0)
MCH: 23 pg — ABNORMAL LOW (ref 26.0–34.0)
MCHC: 31.5 g/dL (ref 30.0–36.0)
MCV: 73 fL — ABNORMAL LOW (ref 80.0–100.0)
Monocytes Absolute: 0.6 10*3/uL (ref 0.1–1.0)
Monocytes Relative: 10 %
Neutro Abs: 2 10*3/uL (ref 1.7–7.7)
Neutrophils Relative %: 33 %
Platelets: 216 10*3/uL (ref 150–400)
RBC: 5.4 MIL/uL — ABNORMAL HIGH (ref 3.87–5.11)
RDW: 16.4 % — ABNORMAL HIGH (ref 11.5–15.5)
WBC: 6.1 10*3/uL (ref 4.0–10.5)
nRBC: 0 % (ref 0.0–0.2)

## 2020-11-11 LAB — MAGNESIUM: Magnesium: 1.7 mg/dL (ref 1.7–2.4)

## 2020-11-11 LAB — PHOSPHORUS: Phosphorus: 3 mg/dL (ref 2.5–4.6)

## 2020-11-11 LAB — T4, FREE: Free T4: 1.2 ng/dL — ABNORMAL HIGH (ref 0.61–1.12)

## 2020-11-11 LAB — TSH: TSH: 0.677 u[IU]/mL (ref 0.350–4.500)

## 2020-11-11 MED ORDER — AMOXICILLIN 500 MG PO CAPS
500.0000 mg | ORAL_CAPSULE | Freq: Three times a day (TID) | ORAL | Status: AC
  Administered 2020-11-11 – 2020-11-16 (×15): 500 mg via ORAL
  Filled 2020-11-11 (×15): qty 1

## 2020-11-11 NOTE — Progress Notes (Signed)
PROGRESS NOTE    Karen Best  DDU:[id number] DOB: May 18, 1955 DOA: 11/07/2020 PCP: [facility]   Chief Complaint  Patient presents with   Altered Mental Status   Brief Narrative:   Karen Best is Karen Best 65 y.o. female with medical history significant for schizophrenia, history of CVA, hypertension, and tobacco abuse, presenting to the emergency department for evaluation of somnolence and dysarthria.  Patient is unable to contribute to the history and her emergency contacts cannot be reached.  Per report of EMS, the patient was noted by roommates to been yelling all throughout the day and then found slumped over in Karen Best chair.  She was found to be somnolent and dysarthric by EMS with blood pressure of 78 systolic.  She was given 2 mg of Narcan with no significant change in her condition reported.   ED Course: Upon arrival to the ED, patient is found to be afebrile, saturating 90s on room air, heart rate in the 60s to 23J, and systolic blood pressure in the low 100s.  EKG features sinus rhythm with LAFB and minimal ST elevation anteriorly.  Chest x-ray negative for acute cardiopulmonary disease.  Head CT with no acute abnormality.  Chemistry panel notable for BUN of 39, sodium 132, bicarbonate 18, and creatinine of 3.76.  CBC with microcytosis without anemia.  Lactic acid 3.5.  Ammonia 64.  Valproic acid level 20.  Patient was given 2 L of saline in the ED.  Assessment & Plan:   Principal Problem:   AKI (acute kidney injury) ([place]) Active Problems:   CVA (cerebral vascular accident) ([place])   Schizoaffective disorder, bipolar type ([place])   Acute encephalopathy   Malnutrition of moderate degree  2. Acute encephalopathy  Concern for Psychosis  Schizophrenia - Patient was reportedly yelling throughout the day but then found slumped over and dysarthric  - No acute findings on head CT  - Ammonia is mildly elevated -> repeat was WNL - UDS negative - B12 wnl  - RPR  nonreactive - delusions again  today, she reports hallucinations, but doesn't describe these clearly - unclear baseline - psych c/s, appreciate recs - zyprexa, depakote, valbenazine - recommended TOC to evaluate home situation  # Abnormal Thyroid Function Tests - TSH suppressed (not undetectable), free T4 mildly elevated - unclear significance  - repeat labs show again mildly suppressed TSH with mildly elevated free t4 - normal free T3 - TFT's normalizing, will continue to trend - 2/2 related to acute illness?  # Aerococcus UTI  - discussed with pharm, will treat with amoxicillin  1. Acute kidney injury  - baseline creatinine <1, presented with creatinine 3.76 - peaked at 4 - [place] without hydro - improved - Check UA ([place]land) and urine chemistries, hold lisinopril and Advil, renally-dose medications, continue IVF hydration, and repeat serum chem panel     3. Schizophrenia  - appreciate psychiatry recs   4. Hypertension  Hypotension - resolved   5. Hyponatremia - mild, continue to monitor  DVT prophylaxis: heparin  Code Status: full  Family Communication: none Disposition:   Status is: Inpatient  Remains inpatient appropriate because:Inpatient level of care appropriate due to severity of illness  Dispo: The patient is from:  group home              Anticipated d/c is to:  pending              Patient currently is not medically stable to d/c.   Difficult  to place patient No       Consultants:  psych  Procedures:  none  Antimicrobials:  Anti-infectives (From admission, onward)    None          Subjective: Nonsensical speech  Objective: Vitals:   11/10/20 1350 11/10/20 2023 11/11/20 0423 11/11/20 1312  BP: (!) 153/76 (!) 142/78 109/83 137/76  Pulse: 64 61 66 77  Resp: 15 14 15 15   Temp: 98.4 F (36.9 C) 98.4 F (36.9 C) 98.4 F (36.9 C) 97.6 F (36.4 C)  TempSrc: Oral Oral Oral Oral  SpO2: 100% 100% 100% 100%  Weight:   68.1 kg   Height:         Intake/Output Summary (Last 24 hours) at 11/11/2020 1506 Last data filed at 11/11/2020 0500 Gross per 24 hour  Intake 480 ml  Output 1700 ml  Net -1220 ml   Filed Weights   11/08/20 0504 11/10/20 0505 11/11/20 0423  Weight: 68.5 kg 71 kg 68.1 kg    Examination:  General: No acute distress. Cardiovascular: RRR Lungs: unlabored Abdomen: Soft, nontender, nondistended Neurological: Alert, nonsensical speech. Moves all extremities 4 . Cranial nerves II through XII grossly intact. Skin: Warm and dry. No rashes or lesions. Extremities: No clubbing or cyanosis. No edema.   Data Reviewed: I have personally reviewed following labs and imaging studies  CBC: Recent Labs  Lab 11/07/20 2127 11/07/20 2141 11/07/20 2359 11/08/20 0717 11/09/20 0236 11/10/20 0223 11/11/20 0114  WBC 8.6  --   --  7.6 6.5 8.3 6.1  NEUTROABS 3.2  --   --   --   --  2.5 2.0  HGB 13.5   < > 16.3* 14.0 11.7* 11.9* 12.4  HCT 43.4   < > 48.0* 45.1 37.2 36.9 39.4  MCV 75.2*  --   --  74.8* 73.4* 72.1* 73.0*  PLT 260  --   --  204 247 203 216   < > = values in this interval not displayed.    Basic Metabolic Panel: Recent Labs  Lab 11/07/20 2127 11/07/20 2141 11/07/20 2359 11/08/20 0717 11/09/20 0236 11/10/20 0223 11/11/20 0114  NA 132* 133* 133* 139 139 137 133*  K 4.1 4.0 4.5 3.9 4.0 4.0 3.8  CL 103 106  --  113* 115* 110 105  CO2 18*  --   --  15* 18* 21* 23  GLUCOSE 84 84  --  73 113* 91 107*  BUN 39* 40*  --  34* 20 13 11   CREATININE 3.76* 4.00*  --  2.18* 0.92 0.70 0.70  CALCIUM 9.6  --   --  8.9 9.5 9.8 9.7  MG  --   --   --   --   --  1.7 1.7  PHOS  --   --   --   --   --  2.3* 3.0    GFR: Estimated Creatinine Clearance: 63.9 mL/min (by C-G formula based on SCr of 0.7 mg/dL).  Liver Function Tests: Recent Labs  Lab 11/07/20 2127 11/10/20 0223 11/11/20 0114  AST 23 21 19   ALT 23 18 18   ALKPHOS 63 60 66  BILITOT 0.8 0.4 0.4  PROT 8.1 6.6 6.6  ALBUMIN 3.7 2.8* 2.8*     CBG: Recent Labs  Lab 11/07/20 2131  GLUCAP 85     Recent Results (from the past 240 hour(s))  Blood culture (routine x 2)     Status: None (Preliminary result)   Collection Time: 11/07/20 11:55 PM  Specimen: BLOOD RIGHT WRIST  Result Value Ref Range Status   Specimen Description BLOOD RIGHT WRIST  Final   Special Requests   Final    BOTTLES DRAWN AEROBIC AND ANAEROBIC Blood Culture adequate volume   Culture   Final    NO GROWTH 2 DAYS Performed at [facility], [address] [place], [place] [address]    Report Status PENDING  Incomplete  SARS CORONAVIRUS 2 (TAT 6-24 HRS) Nasopharyngeal Nasopharyngeal Swab     Status: None   Collection Time: 11/08/20 12:52 AM   Specimen: Nasopharyngeal Swab  Result Value Ref Range Status   SARS Coronavirus 2 NEGATIVE NEGATIVE Final    Comment: (NOTE) SARS-CoV-2 target nucleic acids are NOT DETECTED.  The SARS-CoV-2 RNA is generally detectable in upper and lower respiratory specimens during the acute phase of infection. Negative results do not preclude SARS-CoV-2 infection, do not rule out co-infections with other pathogens, and should not be used as the sole basis for treatment or other patient management decisions. Negative results must be combined with clinical observations, patient history, and epidemiological information. The expected result is Negative.  Fact Sheet for Patients: SugarRoll.be  Fact Sheet for Healthcare Providers: https://www.woods-mathews.com/  This test is not yet approved or cleared by the [place] FDA and  has been authorized for detection and/or diagnosis of SARS-CoV-2 by FDA under an Emergency Use Authorization (EUA). This EUA will remain  in effect (meaning this test can be used) for the duration of the COVID-19 declaration under Se ction 564(b)(1) of the Act, 21 U.S.C. section 360bbb-3(b)(1), unless the authorization is terminated or revoked  sooner.  Performed at [facility], [place] [address], [place], [place] [address]   Urine Culture     Status: Abnormal   Collection Time: 11/08/20  8:45 AM   Specimen: Urine, Clean Catch  Result Value Ref Range Status   Specimen Description URINE, CLEAN CATCH  Final   Special Requests NONE  Final   Culture (Zanylah Hardie)  Final    >=100,000 COLONIES/mL AEROCOCCUS SPECIES Standardized susceptibility testing for this organism is not available. Performed at [facility], [place] [address] [place], [place] [address]    Report Status 11/09/2020 FINAL  Final         Radiology Studies: No results found.      Scheduled Meds:  divalproex  500 mg Oral Q12H   feeding supplement  237 mL Oral BID BM   heparin  5,000 Units Subcutaneous Q8H   multivitamin with minerals  1 tablet Oral Daily   OLANZapine  5 mg Oral BID   pneumococcal 23 valent vaccine  0.5 mL Intramuscular Tomorrow-1000   valbenazine  40 mg Oral Daily   Continuous Infusions:     LOS: 3 days    Time spent: over 30 min    Fayrene Helper, MD [facility]   To contact the attending provider between 7A-7P or the covering provider during after hours 7P-7A, please log into the web site [email or web address] and access using universal [place] password for that web site. If you do not have the password, please call the hospital operator.  11/11/2020, 3:06 PM

## 2020-11-12 DIAGNOSIS — N179 Acute kidney failure, unspecified: Secondary | ICD-10-CM | POA: Diagnosis not present

## 2020-11-12 LAB — CBC WITH DIFFERENTIAL/PLATELET
Abs Immature Granulocytes: 0.05 10*3/uL (ref 0.00–0.07)
Basophils Absolute: 0 10*3/uL (ref 0.0–0.1)
Basophils Relative: 1 %
Eosinophils Absolute: 0.8 10*3/uL — ABNORMAL HIGH (ref 0.0–0.5)
Eosinophils Relative: 10 %
HCT: 43.2 % (ref 36.0–46.0)
Hemoglobin: 13.6 g/dL (ref 12.0–15.0)
Immature Granulocytes: 1 %
Lymphocytes Relative: 46 %
Lymphs Abs: 3.4 10*3/uL (ref 0.7–4.0)
MCH: 22.9 pg — ABNORMAL LOW (ref 26.0–34.0)
MCHC: 31.5 g/dL (ref 30.0–36.0)
MCV: 72.7 fL — ABNORMAL LOW (ref 80.0–100.0)
Monocytes Absolute: 0.6 10*3/uL (ref 0.1–1.0)
Monocytes Relative: 8 %
Neutro Abs: 2.6 10*3/uL (ref 1.7–7.7)
Neutrophils Relative %: 34 %
Platelets: 230 10*3/uL (ref 150–400)
RBC: 5.94 MIL/uL — ABNORMAL HIGH (ref 3.87–5.11)
RDW: 16.8 % — ABNORMAL HIGH (ref 11.5–15.5)
WBC: 7.4 10*3/uL (ref 4.0–10.5)
nRBC: 0 % (ref 0.0–0.2)

## 2020-11-12 LAB — COMPREHENSIVE METABOLIC PANEL
ALT: 33 U/L (ref 0–44)
AST: 38 U/L (ref 15–41)
Albumin: 3 g/dL — ABNORMAL LOW (ref 3.5–5.0)
Alkaline Phosphatase: 69 U/L (ref 38–126)
Anion gap: 8 (ref 5–15)
BUN: 16 mg/dL (ref 8–23)
CO2: 24 mmol/L (ref 22–32)
Calcium: 10.2 mg/dL (ref 8.9–10.3)
Chloride: 105 mmol/L (ref 98–111)
Creatinine, Ser: 0.69 mg/dL (ref 0.44–1.00)
GFR, Estimated: >60 mL/min (ref >60)
Glucose, Bld: 108 mg/dL — ABNORMAL HIGH (ref 70–99)
Potassium: 4.5 mmol/L (ref 3.5–5.1)
Sodium: 137 mmol/L (ref 135–145)
Total Bilirubin: 0.4 mg/dL (ref 0.3–1.2)
Total Protein: 7 g/dL (ref 6.5–8.1)

## 2020-11-12 LAB — T4, FREE: Free T4: 1.06 ng/dL (ref 0.61–1.12)

## 2020-11-12 LAB — MAGNESIUM: Magnesium: 1.9 mg/dL (ref 1.7–2.4)

## 2020-11-12 LAB — PHOSPHORUS: Phosphorus: 3.2 mg/dL (ref 2.5–4.6)

## 2020-11-12 LAB — TSH: TSH: 0.756 u[IU]/mL (ref 0.350–4.500)

## 2020-11-12 MED ORDER — IBUPROFEN 600 MG PO TABS
600.0000 mg | ORAL_TABLET | Freq: Four times a day (QID) | ORAL | Status: DC | PRN
  Administered 2020-11-12: 600 mg via ORAL
  Filled 2020-11-12: qty 1

## 2020-11-12 NOTE — Progress Notes (Addendum)
PROGRESS NOTE    Karen Best  LKG:[id number] DOB: [birth year] DOA: 11/07/2020 PCP: [facility]   Chief Complaint  Patient presents with   Altered Mental Status   Brief Narrative:   Karen Best is Karen Best 65 y.o. female with medical history significant for schizophrenia, history of CVA, hypertension, and tobacco abuse, presenting to the emergency department for evaluation of somnolence and dysarthria.  Patient is unable to contribute to the history and her emergency contacts cannot be reached.  Per report of EMS, the patient was noted by roommates to been yelling all throughout the day and then found slumped over in Yailene Badia chair.  She was found to be somnolent and dysarthric by EMS with blood pressure of 78 systolic.  She was given 2 mg of Narcan with no significant change in her condition reported.   ED Course: Upon arrival to the ED, patient is found to be afebrile, saturating 90s on room air, heart rate in the 60s to 66Y, and systolic blood pressure in the low 100s.  EKG features sinus rhythm with LAFB and minimal ST elevation anteriorly.  Chest x-ray negative for acute cardiopulmonary disease.  Head CT with no acute abnormality.  Chemistry panel notable for BUN of 39, sodium 132, bicarbonate 18, and creatinine of 3.76.  CBC with microcytosis without anemia.  Lactic acid 3.5.  Ammonia 64.  Valproic acid level 20.  Patient was given 2 L of saline in the ED.  Assessment & Plan:   Principal Problem:   AKI (acute kidney injury) ([place]) Active Problems:   CVA (cerebral vascular accident) ([place])   Schizoaffective disorder, bipolar type ([place])   Acute encephalopathy   Malnutrition of moderate degree  2. Acute encephalopathy  Concern for Psychosis  Schizophrenia - Patient was reportedly yelling throughout the day but then found slumped over and dysarthric  - No acute findings on head CT  - Ammonia is mildly elevated -> repeat was WNL - UDS negative - B12 wnl  - RPR  nonreactive - delusions again  today, she reports hallucinations, but doesn't describe these clearly - unclear baseline - psych c/s, appreciate recs - zyprexa, depakote, valbenazine - awaiting repeat eval by psych today - recommended TOC to evaluate home situation  # Abnormal Thyroid Function Tests - TFT's normalized - likely related to nonthyroid illness, follow outpatient   # Aerococcus UTI  - discussed with pharm, will treat with amoxicillin  1. Acute kidney injury  - baseline creatinine <1, presented with creatinine 3.76 - peaked at 4 - [place] without hydro - improved - Check UA ([place]) and urine chemistries, hold lisinopril and Advil, renally-dose medications, continue IVF hydration, and repeat serum chem panel     3. Schizophrenia  - appreciate psychiatry recs   4. Hypertension  Hypotension - resolved   5. Hyponatremia - mild, continue to monitor  DVT prophylaxis: heparin  Code Status: full  Family Communication: none Disposition:   Status is: Inpatient  Remains inpatient appropriate because:Inpatient level of care appropriate due to severity of illness  Dispo: The patient is from:  group home              Anticipated d/c is to:  pending              Patient currently is not medically stable to d/c.   Difficult to place patient No       Consultants:  psych  Procedures:  none  Antimicrobials:  Anti-infectives (From admission, onward)  Start     Dose/Rate Route Frequency Ordered Stop   11/11/20 1630  amoxicillin (AMOXIL) capsule 500 mg        500 mg Oral Every 8 hours 11/11/20 1535 11/16/20 1359          Subjective: Nonsensical speech  Objective: Vitals:   11/11/20 1312 11/11/20 1920 11/12/20 0048 11/12/20 0557  BP: 137/76 (!) 179/84 138/84 120/68  Pulse: 77 73    Resp: 15 15 16 15   Temp: 97.6 F (36.4 C) 97.9 F (36.6 C)  97.6 F (36.4 C)  TempSrc: Oral Oral  Axillary  SpO2: 100% 100%  94%  Weight:    70.7 kg  Height:         Intake/Output Summary (Last 24 hours) at 11/12/2020 1229 Last data filed at 11/12/2020 0500 Gross per 24 hour  Intake 600 ml  Output 2250 ml  Net -1650 ml   Filed Weights   11/10/20 0505 11/11/20 0423 11/12/20 0557  Weight: 71 kg 68.1 kg 70.7 kg    Examination:  General: No acute distress. Cardiovascular: RRR Lungs: unlabored Abdomen: Soft, nontender, nondistended Neurological: Alert .  Seems calmer today, denies SI/HI. Moves all extremities 4 . Cranial nerves II through XII grossly intact. Skin: Warm and dry. No rashes or lesions. Extremities: No clubbing or cyanosis. No edema   Data Reviewed: I have personally reviewed following labs and imaging studies  CBC: Recent Labs  Lab 11/07/20 2127 11/07/20 2141 11/08/20 0717 11/09/20 0236 11/10/20 0223 11/11/20 0114 11/12/20 0052  WBC 8.6  --  7.6 6.5 8.3 6.1 7.4  NEUTROABS 3.2  --   --   --  2.5 2.0 2.6  HGB 13.5   < > 14.0 11.7* 11.9* 12.4 13.6  HCT 43.4   < > 45.1 37.2 36.9 39.4 43.2  MCV 75.2*  --  74.8* 73.4* 72.1* 73.0* 72.7*  PLT 260  --  204 247 203 216 230   < > = values in this interval not displayed.    Basic Metabolic Panel: Recent Labs  Lab 11/08/20 0717 11/09/20 0236 11/10/20 0223 11/11/20 0114 11/12/20 0052  NA 139 139 137 133* 137  K 3.9 4.0 4.0 3.8 4.5  CL 113* 115* 110 105 105  CO2 15* 18* 21* 23 24  GLUCOSE 73 113* 91 107* 108*  BUN 34* 20 13 11 16   CREATININE 2.18* 0.92 0.70 0.70 0.69  CALCIUM 8.9 9.5 9.8 9.7 10.2  MG  --   --  1.7 1.7 1.9  PHOS  --   --  2.3* 3.0 3.2    GFR: Estimated Creatinine Clearance: 70.1 mL/min (by C-G formula based on SCr of 0.69 mg/dL).  Liver Function Tests: Recent Labs  Lab 11/07/20 2127 11/10/20 0223 11/11/20 0114 11/12/20 0052  AST 23 21 19  38  ALT 23 18 18  33  ALKPHOS 63 60 66 69  BILITOT 0.8 0.4 0.4 0.4  PROT 8.1 6.6 6.6 7.0  ALBUMIN 3.7 2.8* 2.8* 3.0*    CBG: Recent Labs  Lab 11/07/20 2131  GLUCAP 85     Recent Results (from  the past 240 hour(s))  Blood culture (routine x 2)     Status: None (Preliminary result)   Collection Time: 11/07/20 11:55 PM   Specimen: BLOOD RIGHT WRIST  Result Value Ref Range Status   Specimen Description BLOOD RIGHT WRIST  Final   Special Requests   Final    BOTTLES DRAWN AEROBIC AND ANAEROBIC Blood Culture adequate volume  Culture   Final    NO GROWTH 4 DAYS Performed at [facility], [place] [address], [place], [place] [address]    Report Status PENDING  Incomplete  SARS CORONAVIRUS 2 (TAT 6-24 HRS) Nasopharyngeal Nasopharyngeal Swab     Status: None   Collection Time: 11/08/20 12:52 AM   Specimen: Nasopharyngeal Swab  Result Value Ref Range Status   SARS Coronavirus 2 NEGATIVE NEGATIVE Final    Comment: (NOTE) SARS-CoV-2 target nucleic acids are NOT DETECTED.  The SARS-CoV-2 RNA is generally detectable in upper and lower respiratory specimens during the acute phase of infection. Negative results do not preclude SARS-CoV-2 infection, do not rule out co-infections with other pathogens, and should not be used as the sole basis for treatment or other patient management decisions. Negative results must be combined with clinical observations, patient history, and epidemiological information. The expected result is Negative.  Fact Sheet for Patients: SugarRoll.be  Fact Sheet for Healthcare Providers: https://www.woods-mathews.com/  This test is not yet approved or cleared by the [place] FDA and  has been authorized for detection and/or diagnosis of SARS-CoV-2 by FDA under an Emergency Use Authorization (EUA). This EUA will remain  in effect (meaning this test can be used) for the duration of the COVID-19 declaration under Se ction 564(b)(1) of the Act, 21 U.S.C. section 360bbb-3(b)(1), unless the authorization is terminated or revoked sooner.  Performed at [facility], [place] [address] [place], [place] [address]    Urine Culture     Status: Abnormal   Collection Time: 11/08/20  8:45 AM   Specimen: Urine, Clean Catch  Result Value Ref Range Status   Specimen Description URINE, CLEAN CATCH  Final   Special Requests NONE  Final   Culture (Hina Gupta)  Final    >=100,000 COLONIES/mL AEROCOCCUS SPECIES Standardized susceptibility testing for this organism is not available. Performed at [facility], [place] [address], [place], [place] [address]    Report Status 11/09/2020 FINAL  Final         Radiology Studies: No results found.      Scheduled Meds:  amoxicillin  500 mg Oral Q8H   divalproex  500 mg Oral Q12H   feeding supplement  237 mL Oral BID BM   heparin  5,000 Units Subcutaneous Q8H   multivitamin with minerals  1 tablet Oral Daily   OLANZapine  5 mg Oral BID   valbenazine  40 mg Oral Daily   Continuous Infusions:     LOS: 4 days    Time spent: over 30 min    Fayrene Helper, MD [facility]   To contact the attending provider between 7A-7P or the covering provider during after hours 7P-7A, please log into the web site [email or web address] and access using universal [place] password for that web site. If you do not have the password, please call the hospital operator.  11/12/2020, 12:29 PM

## 2020-11-12 NOTE — Evaluation (Signed)
Physical Therapy Evaluation Patient Details Name: Karen Best MRN: [id number] DOB: 1955/11/04 Today's Date: 11/12/2020   History of Present Illness  65yo female admitted 09/18/20 with acute encephalopathy likely secondary to UTI. PMH Bipolar, fibromyalgia, schizophrenia, tobacco abuse  Clinical Impression  Pt lethargic during eval. Needed assist with mobility but expect will do much better when more awake. Pt lives in boarding house so will need to be fairly independent to return there. During recent hospitalization (09/2020) pt was able to return to that level. Suspect long term psych meds side effects account for some of her gait patterns.     Follow Up Recommendations Other (comment) (To be determined when more alert)    Equipment Recommendations  None recommended by PT    Recommendations for Other Services       Precautions / Restrictions Precautions Precautions: Fall      Mobility  Bed Mobility Overal bed mobility: Needs Assistance Bed Mobility: Supine to Sit     Supine to sit: Mod assist     General bed mobility comments: Assist due to pt lethargic and not initiating mobility    Transfers Overall transfer level: Needs assistance Equipment used: None Transfers: Sit to/from Stand Sit to Stand: Min assist         General transfer comment: Assist to initiate  Ambulation/Gait Ambulation/Gait assistance: Min assist Gait Distance (Feet): 25 Feet Assistive device: 1 person hand held assist Gait Pattern/deviations: Step-to pattern;Decreased step length - right;Decreased step length - left;Shuffle Gait velocity: decr Gait velocity interpretation: <1.31 ft/sec, indicative of household ambulator General Gait Details: Assist for balance. Intially on standing pt with side to side and anterior/posterior hip movement which appeared to be more voluntary than balance or instability. Gait was very small shuffling steps.  Stairs            Wheelchair Mobility     Modified Rankin (Stroke Patients Only)       Balance Overall balance assessment: Needs assistance Sitting-balance support: No upper extremity supported;Feet supported Sitting balance-Leahy Scale: Poor Sitting balance - Comments: posterior lean when falling back to sleep Postural control: Posterior lean Standing balance support: Single extremity supported Standing balance-Leahy Scale: Poor Standing balance comment: UE support                             Pertinent Vitals/Pain Pain Assessment: No/denies pain    Home Living Family/patient expects to be discharged to:: Unsure Living Arrangements: Alone   Type of Home: Other(Comment) (boarding house) Home Access: Level entry       Home Equipment: None      Prior Function Level of Independence: Needs assistance   Gait / Transfers Assistance Needed: ambulating modified independent without assistive device           Hand Dominance   Dominant Hand: Right    Extremity/Trunk Assessment   Upper Extremity Assessment Upper Extremity Assessment: Generalized weakness    Lower Extremity Assessment Lower Extremity Assessment: Generalized weakness       Communication   Communication: No difficulties  Cognition Arousal/Alertness: Lethargic Behavior During Therapy: Flat affect Overall Cognitive Status: No family/caregiver present to determine baseline cognitive functioning                                 General Comments: pt with psych history. Followed 1 step commands once aroused.      General Comments  Exercises     Assessment/Plan    PT Assessment Patient needs continued PT services  PT Problem List Decreased strength;Decreased balance;Decreased mobility;Decreased activity tolerance       PT Treatment Interventions DME instruction;Gait training;Functional mobility training;Therapeutic activities;Therapeutic exercise;Balance training;Patient/family education;Stair training     PT Goals (Current goals can be found in the Care Plan section)  Acute Rehab PT Goals Patient Stated Goal: not stated PT Goal Formulation: With patient Time For Goal Achievement: 11/26/20 Potential to Achieve Goals: Good    Frequency Min 3X/week   Barriers to discharge Decreased caregiver support Lives in a boarding house.    Co-evaluation               AM-PAC PT "6 Clicks" Mobility  Outcome Measure Help needed turning from your back to your side while in a flat bed without using bedrails?: A Little Help needed moving from lying on your back to sitting on the side of a flat bed without using bedrails?: A Lot Help needed moving to and from a bed to a chair (including a wheelchair)?: A Little Help needed standing up from a chair using your arms (e.g., wheelchair or bedside chair)?: A Little Help needed to walk in hospital room?: A Little Help needed climbing 3-5 steps with a railing? : A Little 6 Click Score: 17    End of Session Equipment Utilized During Treatment: Gait belt Activity Tolerance: Patient limited by lethargy Patient left: in chair;with call bell/phone within reach;with chair alarm set Nurse Communication: Mobility status PT Visit Diagnosis: Other abnormalities of gait and mobility (R26.89);Muscle weakness (generalized) (M62.81)    Time: 2956-2130 PT Time Calculation (min) (ACUTE ONLY): 22 min   Charges:   PT Evaluation $PT Eval Moderate Complexity: [place] Pager [phone] Office [place] 11/12/2020, 4:04 PM

## 2020-11-12 NOTE — Care Management (Signed)
11-12-20 1454 Case Manager received a consult for frequent ED visits and medication refills. Patient states she lives in a boarding house at [address] in [place]. Patient states that two other men live in the boarding house as well. Per patient, she has her own bathroom. Patient has Medicaid- medications should be no more than $3.00 and she utilizes [facility] ([facility]). Case Manager tried calling [facility] and was not able to get an answer. Physical/Occupational Therapy to see the patient for recommendations. Case Manager will continue to follow for additional toc needs.

## 2020-11-12 NOTE — Consult Note (Addendum)
[facility] Consult   Reason for Consult:  delusions, psychosis Referring Physician:  Cephus Slater, Lamonte Sakai Patient Identification: Karen Best MRN:  [id number] Principal Diagnosis: AKI (acute kidney injury) [facility]) Diagnosis:  Principal Problem:   AKI (acute kidney injury) ([place]) Active Problems:   CVA (cerebral vascular accident) ([place])   Schizoaffective disorder, bipolar type ([place])   Acute encephalopathy   Malnutrition of moderate degree   Total Time spent with patient: 20 minutes  Subjective:   Karen Best is a 65 y.o. female patient admitted for acute encephalopathy.  HPI:    Pt is a pleasant 65 yo female w/ mhx schizoaffective disorder bipolar type, history of CVA, htn, tobacco abuse presenting to the ED for evaluation of somnolence and dysarthria. Patient seen at bedside this morning. Pt much quieter than last week. Pt still endorses auditory hallucination but now stating TV pastor Nelda Bucks is telling pt to "jump into the fire". Pt disregards command hallucinations and that she "only listens to Jesus". Pt states she has been able to sleep and has been in "good" mood.  Pt denies SI/HI/VH.  Past Psychiatric History: Schizoaffective Bipolar type  Risk to Self:   no Risk to Others:   no Prior Inpatient Therapy:   yes Prior Outpatient Therapy:   yes  Past Medical History:  Past Medical History:  Diagnosis Date   Asthma    Bipolar 1 disorder ([place])    Fibromyalgia    Hypertension    Schizophrenia ([place])    Tobacco abuse     Past Surgical History:  Procedure Laterality Date   CESAREAN SECTION     TUBAL LIGATION     Family History:  Family History  Problem Relation Age of Onset   Liver cancer Neg Hx    Liver disease Neg Hx    Family Psychiatric  History: N/A Social History:  Social History   Substance and Sexual Activity  Alcohol Use Yes   Comment: occ     Social History   Substance and Sexual Activity  Drug Use Yes   Types:  Cocaine   Comment: crack cocaine (last use 10/25/2018)    Social History   Socioeconomic History   Marital status: Legally Separated    Spouse name: Not on file   Number of children: Not on file   Years of education: Not on file   Highest education level: Not on file  Occupational History   Not on file  Tobacco Use   Smoking status: Every Day    Packs/day: 0.50    Types: Cigarettes   Smokeless tobacco: Never  Vaping Use   Vaping Use: Never used  Substance and Sexual Activity   Alcohol use: Yes    Comment: occ   Drug use: Yes    Types: Cocaine    Comment: crack cocaine (last use 10/25/2018)   Sexual activity: Yes    Birth control/protection: None  Other Topics Concern   Not on file  Social History Narrative   Not on file   Social Determinants of Health   Financial Resource Strain: Not on file  Food Insecurity: Not on file  Transportation Needs: Not on file  Physical Activity: Not on file  Stress: Not on file  Social Connections: Not on file   Additional Social History:    Allergies:  No Known Allergies  Labs:  Results for orders placed or performed during the hospital encounter of 11/07/20 (from the past 48 hour(s))  CBC with Differential/Platelet  Status: Abnormal   Collection Time: 11/11/20  1:14 AM  Result Value Ref Range   WBC 6.1 4.0 - 10.5 K/uL   RBC 5.40 (H) 3.87 - 5.11 MIL/uL   Hemoglobin 12.4 12.0 - 15.0 g/dL   HCT 39.4 36.0 - 46.0 %   MCV 73.0 (L) 80.0 - 100.0 fL   MCH 23.0 (L) 26.0 - 34.0 pg   MCHC 31.5 30.0 - 36.0 g/dL   RDW 16.4 (H) 11.5 - 15.5 %   Platelets 216 150 - 400 K/uL    Comment: REPEATED TO VERIFY   nRBC 0.0 0.0 - 0.2 %   Neutrophils Relative % 33 %   Neutro Abs 2.0 1.7 - 7.7 K/uL   Lymphocytes Relative 47 %   Lymphs Abs 2.9 0.7 - 4.0 K/uL   Monocytes Relative 10 %   Monocytes Absolute 0.6 0.1 - 1.0 K/uL   Eosinophils Relative 10 %   Eosinophils Absolute 0.6 (H) 0.0 - 0.5 K/uL   Basophils Relative 0 %   Basophils Absolute  0.0 0.0 - 0.1 K/uL   Immature Granulocytes 0 %   Abs Immature Granulocytes 0.02 0.00 - 0.07 K/uL    Comment: Performed at [facility], [address] [place], [place] [address]  Comprehensive metabolic panel     Status: Abnormal   Collection Time: 11/11/20  1:14 AM  Result Value Ref Range   Sodium 133 (L) 135 - 145 mmol/L   Potassium 3.8 3.5 - 5.1 mmol/L   Chloride 105 98 - 111 mmol/L   CO2 23 22 - 32 mmol/L   Glucose, Bld 107 (H) 70 - 99 mg/dL    Comment: Glucose reference range applies only to samples taken after fasting for at least 8 hours.   BUN 11 8 - 23 mg/dL   Creatinine, Ser 0.70 0.44 - 1.00 mg/dL   Calcium 9.7 8.9 - 10.3 mg/dL   Total Protein 6.6 6.5 - 8.1 g/dL   Albumin 2.8 (L) 3.5 - 5.0 g/dL   AST 19 15 - 41 U/L   ALT 18 0 - 44 U/L   Alkaline Phosphatase 66 38 - 126 U/L   Total Bilirubin 0.4 0.3 - 1.2 mg/dL   GFR, Estimated >60 >60 mL/min    Comment: (NOTE) Calculated using the CKD-EPI Creatinine Equation (2021)    Anion gap 5 5 - 15    Comment: Performed at [facility] [address], [place], [place] [address]  Magnesium     Status: None   Collection Time: 11/11/20  1:14 AM  Result Value Ref Range   Magnesium 1.7 1.7 - 2.4 mg/dL    Comment: Performed at [facility] [address], [place], [place] [address]  Phosphorus     Status: None   Collection Time: 11/11/20  1:14 AM  Result Value Ref Range   Phosphorus 3.0 2.5 - 4.6 mg/dL    Comment: Performed at [facility] [address], [place], [place] [address]  T4, free     Status: Abnormal   Collection Time: 11/11/20  1:14 AM  Result Value Ref Range   Free T4 1.20 (H) 0.61 - 1.12 ng/dL    Comment: (NOTE) Biotin ingestion may interfere with free T4 tests. If the results are inconsistent with the TSH level, previous test results, or the clinical presentation, then consider biotin interference. If needed, order repeat testing after stopping biotin. Performed at [facility], [place] [address]  [place], [place] [address]   TSH     Status: None   Collection Time: 11/11/20  1:14 AM  Result Value Ref Range   TSH 0.677 0.350 - 4.500 uIU/mL    Comment: Performed by a 3rd Generation assay with a functional sensitivity of <=0.01 uIU/mL. Performed at [facility], [place] [address], [place], [place] [address]   CBC with Differential/Platelet     Status: Abnormal   Collection Time: 11/12/20 12:52 AM  Result Value Ref Range   WBC 7.4 4.0 - 10.5 K/uL   RBC 5.94 (H) 3.87 - 5.11 MIL/uL   Hemoglobin 13.6 12.0 - 15.0 g/dL   HCT 43.2 36.0 - 46.0 %   MCV 72.7 (L) 80.0 - 100.0 fL   MCH 22.9 (L) 26.0 - 34.0 pg   MCHC 31.5 30.0 - 36.0 g/dL   RDW 16.8 (H) 11.5 - 15.5 %   Platelets 230 150 - 400 K/uL    Comment: REPEATED TO VERIFY   nRBC 0.0 0.0 - 0.2 %   Neutrophils Relative % 34 %   Neutro Abs 2.6 1.7 - 7.7 K/uL   Lymphocytes Relative 46 %   Lymphs Abs 3.4 0.7 - 4.0 K/uL   Monocytes Relative 8 %   Monocytes Absolute 0.6 0.1 - 1.0 K/uL   Eosinophils Relative 10 %   Eosinophils Absolute 0.8 (H) 0.0 - 0.5 K/uL   Basophils Relative 1 %   Basophils Absolute 0.0 0.0 - 0.1 K/uL   Immature Granulocytes 1 %   Abs Immature Granulocytes 0.05 0.00 - 0.07 K/uL    Comment: Performed at [facility], [place] [address], [place], [place] [address]  Comprehensive metabolic panel     Status: Abnormal   Collection Time: 11/12/20 12:52 AM  Result Value Ref Range   Sodium 137 135 - 145 mmol/L   Potassium 4.5 3.5 - 5.1 mmol/L   Chloride 105 98 - 111 mmol/L   CO2 24 22 - 32 mmol/L   Glucose, Bld 108 (H) 70 - 99 mg/dL    Comment: Glucose reference range applies only to samples taken after fasting for at least 8 hours.   BUN 16 8 - 23 mg/dL   Creatinine, Ser 0.69 0.44 - 1.00 mg/dL   Calcium 10.2 8.9 - 10.3 mg/dL   Total Protein 7.0 6.5 - 8.1 g/dL   Albumin 3.0 (L) 3.5 - 5.0 g/dL   AST 38 15 - 41 U/L   ALT 33 0 - 44 U/L   Alkaline Phosphatase 69 38 - 126 U/L   Total Bilirubin  0.4 0.3 - 1.2 mg/dL   GFR, Estimated >60 >60 mL/min    Comment: (NOTE) Calculated using the CKD-EPI Creatinine Equation (2021)    Anion gap 8 5 - 15    Comment: Performed at [facility] [address] [place], [place] [address]  Magnesium     Status: None   Collection Time: 11/12/20 12:52 AM  Result Value Ref Range   Magnesium 1.9 1.7 - 2.4 mg/dL    Comment: Performed at [facility] [address] [place], [place] [address]  Phosphorus     Status: None   Collection Time: 11/12/20 12:52 AM  Result Value Ref Range   Phosphorus 3.2 2.5 - 4.6 mg/dL    Comment: Performed at [facility] [address] [place], [place] [address]  TSH     Status: None   Collection Time: 11/12/20 12:52 AM  Result  Value Ref Range   TSH 0.756 0.350 - 4.500 uIU/mL    Comment: Performed by a 3rd Generation assay with a functional sensitivity of <=0.01 uIU/mL. Performed at [facility], [place] [address], [place], [place] [address]   T4, free     Status: None   Collection Time: 11/12/20 12:52 AM  Result Value Ref Range   Free T4 1.06 0.61 - 1.12 ng/dL    Comment: (NOTE) Biotin ingestion may interfere with free T4 tests. If the results are inconsistent with the TSH level, previous test results, or the clinical presentation, then consider biotin interference. If needed, order repeat testing after stopping biotin. Performed at [facility], [place] [address] [place], [place] [address]     Current Facility-Administered Medications  Medication Dose Route Frequency Provider Last Rate Last Admin   acetaminophen (TYLENOL) tablet 650 mg  650 mg Oral Q6H PRN Opyd, Ilene Qua, MD   650 mg at 11/12/20 0050   Or   acetaminophen (TYLENOL) suppository 650 mg  650 mg Rectal Q6H PRN Opyd, Ilene Qua, MD       amoxicillin (AMOXIL) capsule 500 mg  500 mg Oral Q8H Elodia [place]., MD   500 mg at 11/12/20 0602   clonazePAM (KLONOPIN) tablet 0.5 mg  0.5 mg Oral BID PRN Vianne Bulls,  MD   0.5 mg at 11/11/20 2321   divalproex (DEPAKOTE) DR tablet 500 mg  500 mg Oral Q12H France Ravens, MD   500 mg at 11/12/20 2778   feeding supplement (ENSURE ENLIVE / ENSURE PLUS) liquid 237 mL  237 mL Oral BID BM Elodia [place]., MD   237 mL at 11/12/20 1002   heparin injection 5,000 Units  5,000 Units Subcutaneous Q8H Opyd, Ilene Qua, MD   5,000 Units at 11/12/20 0602   ibuprofen (ADVIL) tablet 600 mg  600 mg Oral Q6H PRN Chotiner, Yevonne Aline, MD   600 mg at 11/12/20 0156   multivitamin with minerals tablet 1 tablet  1 tablet Oral Daily Elodia Florence., MD   1 tablet at 11/12/20 0958   OLANZapine (ZYPREXA) tablet 5 mg  5 mg Oral BID France Ravens, MD   5 mg at 11/12/20 0959   ondansetron (ZOFRAN) tablet 4 mg  4 mg Oral Q6H PRN Opyd, Ilene Qua, MD       Or   ondansetron (ZOFRAN) injection 4 mg  4 mg Intravenous Q6H PRN Opyd, Ilene Qua, MD       valbenazine [facility]) capsule 40 mg  40 mg Oral Daily France Ravens, MD   40 mg at 11/12/20 2423       Psychiatric Specialty Exam:  Presentation  General Appearance: Casual  Eye Contact:Good  Speech:Clear and Coherent  Speech Volume:Decreased   Mood and Affect  Mood:Euphoric  Affect:Constricted   Thought Process  Thought Processes:Goal Directed  Descriptions of Associations:Loose  Orientation:Full (Time, Place and Person)  Thought Content:Obsessions  History of Schizophrenia/Schizoaffective disorder:Yes  Duration of Psychotic Symptoms:Less than six months  Hallucinations:No data recorded  Ideas of Reference:None  Suicidal Thoughts:No data recorded  Homicidal Thoughts:No data recorded   Sensorium  Memory:Immediate Fair; Recent Fair; Remote Fair  Judgment:Fair  Insight:Fair   Executive Functions  Concentration:Fair  Attention Span:Fair  [place]   Psychomotor Activity  Psychomotor Activity:No data recorded   Assets  Assets:Desire for  Improvement   Sleep  Sleep:No data recorded   Physical Exam: Physical Exam  Vitals reviewed.  Constitutional:      Appearance: Normal appearance.  HENT:     Head: Normocephalic and atraumatic.  Eyes:     Extraocular Movements: Extraocular movements intact.  Cardiovascular:     Rate and Rhythm: Normal rate and regular rhythm.  Pulmonary:     Effort: Pulmonary effort is normal.     Breath sounds: Normal breath sounds.  Abdominal:     General: Abdomen is flat.     Palpations: Abdomen is soft.  Musculoskeletal:     Cervical back: Normal range of motion.  Skin:    General: Skin is warm and dry.  Neurological:     General: No focal deficit present.     Mental Status: She is alert and oriented to person, place, and time.   Review of Systems  Constitutional:  Negative for chills and fever.  Respiratory:  Negative for cough, shortness of breath and wheezing.   Cardiovascular:  Negative for chest pain and palpitations.  Gastrointestinal:  Negative for abdominal pain, nausea and vomiting.  Skin:  Negative for itching and rash.  Neurological:  Negative for dizziness and headaches.  Blood pressure 120/68, pulse 73, temperature 97.6 F (36.4 C), temperature source Axillary, resp. rate 15, height 5\' 5"  (1.651 m), weight 70.7 kg, SpO2 94 %. Body mass index is 25.94 kg/m.  Treatment Plan Summary: Pt is 65 yo w/ hx of schizoaffective bipolar type admitted for acute encephalopathy and AKI. Auditory hallucination were not previously noted last [facility] visit in May 2022. Patient sleeping appropriately and no longer appears manic. Given pt's multiple hospitalization, pt would benefit from assisted living to ensure appropriate medication adherence. Pt still endorsing auditory hallucination but does not appear as euphoric and was able to sleep. Will adjust medication tomorrow if pt's auditory hallucinations are unchanged.    Schizoaffective Disorder, Bipolar Type -Zyprexa 5 mg bid for  schizoaffective disorder -Depakote 500 mg bid for schizoaffective disorder -Valbenazine 40 mg qhs for hx of tardive dyskinesia -Recommend TOC consult to evaluate home situation -No Changes today but will recommend zyprexa dose adjustment should auditory hallucination still be present tomorrow. -Will continue to follow   Disposition: No evidence of imminent risk to self or others at present.    Thank you for the consult France Ravens, MD PGY1 Psychiatry Resident 11/12/2020 1:13 PM

## 2020-11-13 DIAGNOSIS — N179 Acute kidney failure, unspecified: Secondary | ICD-10-CM | POA: Diagnosis not present

## 2020-11-13 LAB — CULTURE, BLOOD (ROUTINE X 2)
Culture: NO GROWTH
Special Requests: ADEQUATE

## 2020-11-13 NOTE — Plan of Care (Signed)

## 2020-11-13 NOTE — Progress Notes (Signed)
PROGRESS NOTE    Karen Best  TIR:[id number] DOB: 10-08-1955 DOA: 11/07/2020 PCP: [facility]   Chief Complaint  Patient presents with   Altered Mental Status   Brief Narrative:   Karen Best is Karen Best 65 y.o. female with medical history significant for schizophrenia, history of CVA, hypertension, and tobacco abuse, presenting to the emergency department for evaluation of somnolence and dysarthria.  Patient is unable to contribute to the history and her emergency contacts cannot be reached.  Per report of EMS, the patient was noted by roommates to been yelling all throughout the day and then found slumped over in Karen Best chair.  She was found to be somnolent and dysarthric by EMS with blood pressure of 78 systolic.  She was given 2 mg of Narcan with no significant change in her condition reported.   ED Course: Upon arrival to the ED, patient is found to be afebrile, saturating 90s on room air, heart rate in the 60s to 67Y, and systolic blood pressure in the low 100s.  EKG features sinus rhythm with LAFB and minimal ST elevation anteriorly.  Chest x-ray negative for acute cardiopulmonary disease.  Head CT with no acute abnormality.  Chemistry panel notable for BUN of 39, sodium 132, bicarbonate 18, and creatinine of 3.76.  CBC with microcytosis without anemia.  Lactic acid 3.5.  Ammonia 64.  Valproic acid level 20.  Patient was given 2 L of saline in the ED.  She was admitted for confusion.  Psychiatry was consulted and treated her for mania/hallucinations.  She's improving at this point.  Plan for discharge to ALF.   Assessment & Plan:   Principal Problem:   AKI (acute kidney injury) ([place]) Active Problems:   CVA (cerebral vascular accident) ([place])   Schizoaffective disorder, bipolar type ([place])   Acute encephalopathy   Malnutrition of moderate degree  2. Acute encephalopathy  Concern for Psychosis  Schizophrenia - Patient was reportedly yelling throughout the  day but then found slumped over and dysarthric  - No acute findings on head CT  - Ammonia is mildly elevated -> repeat was WNL - UDS negative - B12 wnl  - RPR nonreactive - hallucinations, mania improving per psych - psych c/s, appreciate recs - zyprexa, depakote, valbenazine - per psych - signing off at this point as psych stable 7/19  - recommended TOC to evaluate home situation  # Abnormal Thyroid Function Tests - TFT's normalized - likely related to nonthyroid illness, follow outpatient   # Aerococcus UTI  - discussed with pharm, will treat with amoxicillin  1. Acute kidney injury  - baseline creatinine <1, presented with creatinine 3.76 - peaked at 4 - [place] without hydro - improved - Check UA (bland) and urine chemistries, hold lisinopril and Advil, renally-dose medications, continue IVF hydration, and repeat serum chem panel     3. Schizophrenia  - appreciate psychiatry recs   4. Hypertension  Hypotension - resolved   5. Hyponatremia - mild, continue to monitor  DVT prophylaxis: heparin  Code Status: full  Family Communication: none Disposition:   Status is: Inpatient  Remains inpatient appropriate because:Inpatient level of care appropriate due to severity of illness  Dispo: The patient is from:  group home              Anticipated d/c is to:  pending              Patient currently is not medically stable to d/c.  Difficult to place patient No       Consultants:  psych  Procedures:  none  Antimicrobials:  Anti-infectives (From admission, onward)    Start     Dose/Rate Route Frequency Ordered Stop   11/11/20 1630  amoxicillin (AMOXIL) capsule 500 mg        500 mg Oral Every 8 hours 11/11/20 1535 11/16/20 1359          Subjective: No complaints  Objective: Vitals:   11/12/20 1417 11/12/20 2037 11/13/20 0554 11/13/20 1400  BP: (!) 163/79 131/77 117/73 121/76  Pulse: 72  69 82  Resp: 16 13 14 19   Temp: 97.6 F (36.4 C) 97.9 F (36.6 C)  98.2 F (36.8 C) 97.9 F (36.6 C)  TempSrc: Oral Oral Axillary Axillary  SpO2:  95% 92%   Weight:   68.6 kg   Height:        Intake/Output Summary (Last 24 hours) at 11/13/2020 2020 Last data filed at 11/13/2020 1100 Gross per 24 hour  Intake 957 ml  Output 850 ml  Net 107 ml   Filed Weights   11/11/20 0423 11/12/20 0557 11/13/20 0554  Weight: 68.1 kg 70.7 kg 68.6 kg    Examination:  General: No acute distress. Cardiovascular: RRR. Lungs: unlabored Abdomen: Soft, nontender, nondistended Neurological: Alert. Moves all extremities 4 . Cranial nerves II through XII grossly intact. Skin: Warm and dry. No rashes or lesions. Extremities: No clubbing or cyanosis. No edema.     Data Reviewed: I have personally reviewed following labs and imaging studies  CBC: Recent Labs  Lab 11/07/20 2127 11/07/20 2141 11/08/20 0717 11/09/20 0236 11/10/20 0223 11/11/20 0114 11/12/20 0052  WBC 8.6  --  7.6 6.5 8.3 6.1 7.4  NEUTROABS 3.2  --   --   --  2.5 2.0 2.6  HGB 13.5   < > 14.0 11.7* 11.9* 12.4 13.6  HCT 43.4   < > 45.1 37.2 36.9 39.4 43.2  MCV 75.2*  --  74.8* 73.4* 72.1* 73.0* 72.7*  PLT 260  --  204 247 203 216 230   < > = values in this interval not displayed.    Basic Metabolic Panel: Recent Labs  Lab 11/08/20 0717 11/09/20 0236 11/10/20 0223 11/11/20 0114 11/12/20 0052  NA 139 139 137 133* 137  K 3.9 4.0 4.0 3.8 4.5  CL 113* 115* 110 105 105  CO2 15* 18* 21* 23 24  GLUCOSE 73 113* 91 107* 108*  BUN 34* 20 13 11 16   CREATININE 2.18* 0.92 0.70 0.70 0.69  CALCIUM 8.9 9.5 9.8 9.7 10.2  MG  --   --  1.7 1.7 1.9  PHOS  --   --  2.3* 3.0 3.2    GFR: Estimated Creatinine Clearance: 69.1 mL/min (by C-G formula based on SCr of 0.69 mg/dL).  Liver Function Tests: Recent Labs  Lab 11/07/20 2127 11/10/20 0223 11/11/20 0114 11/12/20 0052  AST 23 21 19  38  ALT 23 18 18  33  ALKPHOS 63 60 66 69  BILITOT 0.8 0.4 0.4 0.4  PROT 8.1 6.6 6.6 7.0  ALBUMIN 3.7 2.8*  2.8* 3.0*    CBG: Recent Labs  Lab 11/07/20 2131  GLUCAP 85     Recent Results (from the past 240 hour(s))  Blood culture (routine x 2)     Status: None   Collection Time: 11/07/20 11:55 PM   Specimen: BLOOD RIGHT WRIST  Result Value Ref Range Status   Specimen Description BLOOD RIGHT  WRIST  Final   Special Requests   Final    BOTTLES DRAWN AEROBIC AND ANAEROBIC Blood Culture adequate volume   Culture   Final    NO GROWTH 5 DAYS Performed at [facility], [address], [place], [place] [address]    Report Status 11/13/2020 FINAL  Final  SARS CORONAVIRUS 2 (TAT 6-24 HRS) Nasopharyngeal Nasopharyngeal Swab     Status: None   Collection Time: 11/08/20 12:52 AM   Specimen: Nasopharyngeal Swab  Result Value Ref Range Status   SARS Coronavirus 2 NEGATIVE NEGATIVE Final    Comment: (NOTE) SARS-CoV-2 target nucleic acids are NOT DETECTED.  The SARS-CoV-2 RNA is generally detectable in upper and lower respiratory specimens during the acute phase of infection. Negative results do not preclude SARS-CoV-2 infection, do not rule out co-infections with other pathogens, and should not be used as the sole basis for treatment or other patient management decisions. Negative results must be combined with clinical observations, patient history, and epidemiological information. The expected result is Negative.  Fact Sheet for Patients: SugarRoll.be  Fact Sheet for Healthcare Providers: https://www.woods-mathews.com/  This test is not yet approved or cleared by the [place] FDA and  has been authorized for detection and/or diagnosis of SARS-CoV-2 by FDA under an Emergency Use Authorization (EUA). This EUA will remain  in effect (meaning this test can be used) for the duration of the COVID-19 declaration under Se ction 564(b)(1) of the Act, 21 U.S.C. section 360bbb-3(b)(1), unless the authorization is terminated or revoked  sooner.  Performed at [facility], [place] [address] [place], [place] [address]   Urine Culture     Status: Abnormal   Collection Time: 11/08/20  8:45 AM   Specimen: Urine, Clean Catch  Result Value Ref Range Status   Specimen Description URINE, CLEAN CATCH  Final   Special Requests NONE  Final   Culture (Taffany Heiser)  Final    >=100,000 COLONIES/mL AEROCOCCUS SPECIES Standardized susceptibility testing for this organism is not available. Performed at [facility], [place] [address], [place],  [address]    Report Status 11/09/2020 FINAL  Final         Radiology Studies: No results found.      Scheduled Meds:  amoxicillin  500 mg Oral Q8H   divalproex  500 mg Oral Q12H   feeding supplement  237 mL Oral BID BM   heparin  5,000 Units Subcutaneous Q8H   multivitamin with minerals  1 tablet Oral Daily   OLANZapine  5 mg Oral BID   valbenazine  40 mg Oral Daily   Continuous Infusions:     LOS: 5 days    Time spent: over 30 min    Fayrene Helper, MD [facility]   To contact the attending provider between 7A-7P or the covering provider during after hours 7P-7A, please log into the web site [email or web address] and access using universal [place] password for that web site. If you do not have the password, please call the hospital operator.  11/13/2020, 8:20 PM

## 2020-11-13 NOTE — Consult Note (Signed)
[facility] Consult   Reason for Consult:  delusions, psychosis Referring Physician:  Cephus Slater, Lamonte Sakai Patient Identification: Karen Best MRN:  [id number] Principal Diagnosis: AKI (acute kidney injury) [facility]) Diagnosis:  Principal Problem:   AKI (acute kidney injury) ([place]) Active Problems:   CVA (cerebral vascular accident) ([place])   Schizoaffective disorder, bipolar type ([place])   Acute encephalopathy   Malnutrition of moderate degree   Total Time spent with patient: 20 minutes  Subjective:   Karen Best is a 65 y.o. female patient admitted for acute encephalopathy.  HPI:    Pt is a pleasant 65 yo female w/ mhx schizoaffective disorder bipolar type, history of CVA, htn, tobacco abuse presenting to the ED for evaluation of somnolence and dysarthria. Patient seen at bedside this morning. Pt states she is doing "good". Pt denies present hallucinations. Pt still very soft spoken. Pt sleeping well.  Pt denies SI/HI/AVH.  Past Psychiatric History: Schizoaffective Bipolar type  Risk to Self:   no Risk to Others:   no Prior Inpatient Therapy:   yes Prior Outpatient Therapy:   yes  Past Medical History:  Past Medical History:  Diagnosis Date   Asthma    Bipolar 1 disorder ([place])    Fibromyalgia    Hypertension    Schizophrenia ([place])    Tobacco abuse     Past Surgical History:  Procedure Laterality Date   CESAREAN SECTION     TUBAL LIGATION     Family History:  Family History  Problem Relation Age of Onset   Liver cancer Neg Hx    Liver disease Neg Hx    Family Psychiatric  History: N/A Social History:  Social History   Substance and Sexual Activity  Alcohol Use Yes   Comment: occ     Social History   Substance and Sexual Activity  Drug Use Yes   Types: Cocaine   Comment: crack cocaine (last use 10/25/2018)    Social History   Socioeconomic History   Marital status: Legally Separated    Spouse name: Not on file   Number of  children: Not on file   Years of education: Not on file   Highest education level: Not on file  Occupational History   Not on file  Tobacco Use   Smoking status: Every Day    Packs/day: 0.50    Types: Cigarettes   Smokeless tobacco: Never  Vaping Use   Vaping Use: Never used  Substance and Sexual Activity   Alcohol use: Yes    Comment: occ   Drug use: Yes    Types: Cocaine    Comment: crack cocaine (last use 10/25/2018)   Sexual activity: Yes    Birth control/protection: None  Other Topics Concern   Not on file  Social History Narrative   Not on file   Social Determinants of Health   Financial Resource Strain: Not on file  Food Insecurity: Not on file  Transportation Needs: Not on file  Physical Activity: Not on file  Stress: Not on file  Social Connections: Not on file   Additional Social History:    Allergies:  No Known Allergies  Labs:  Results for orders placed or performed during the hospital encounter of 11/07/20 (from the past 48 hour(s))  CBC with Differential/Platelet     Status: Abnormal   Collection Time: 11/12/20 12:52 AM  Result Value Ref Range   WBC 7.4 4.0 - 10.5 K/uL   RBC 5.94 (H) 3.87 - 5.11  MIL/uL   Hemoglobin 13.6 12.0 - 15.0 g/dL   HCT 43.2 36.0 - 46.0 %   MCV 72.7 (L) 80.0 - 100.0 fL   MCH 22.9 (L) 26.0 - 34.0 pg   MCHC 31.5 30.0 - 36.0 g/dL   RDW 16.8 (H) 11.5 - 15.5 %   Platelets 230 150 - 400 K/uL    Comment: REPEATED TO VERIFY   nRBC 0.0 0.0 - 0.2 %   Neutrophils Relative % 34 %   Neutro Abs 2.6 1.7 - 7.7 K/uL   Lymphocytes Relative 46 %   Lymphs Abs 3.4 0.7 - 4.0 K/uL   Monocytes Relative 8 %   Monocytes Absolute 0.6 0.1 - 1.0 K/uL   Eosinophils Relative 10 %   Eosinophils Absolute 0.8 (H) 0.0 - 0.5 K/uL   Basophils Relative 1 %   Basophils Absolute 0.0 0.0 - 0.1 K/uL   Immature Granulocytes 1 %   Abs Immature Granulocytes 0.05 0.00 - 0.07 K/uL    Comment: Performed at [facility], [address], [place], [place]  [address]  Comprehensive metabolic panel     Status: Abnormal   Collection Time: 11/12/20 12:52 AM  Result Value Ref Range   Sodium 137 135 - 145 mmol/L   Potassium 4.5 3.5 - 5.1 mmol/L   Chloride 105 98 - 111 mmol/L   CO2 24 22 - 32 mmol/L   Glucose, Bld 108 (H) 70 - 99 mg/dL    Comment: Glucose reference range applies only to samples taken after fasting for at least 8 hours.   BUN 16 8 - 23 mg/dL   Creatinine, Ser 0.69 0.44 - 1.00 mg/dL   Calcium 10.2 8.9 - 10.3 mg/dL   Total Protein 7.0 6.5 - 8.1 g/dL   Albumin 3.0 (L) 3.5 - 5.0 g/dL   AST 38 15 - 41 U/L   ALT 33 0 - 44 U/L   Alkaline Phosphatase 69 38 - 126 U/L   Total Bilirubin 0.4 0.3 - 1.2 mg/dL   GFR, Estimated >60 >60 mL/min    Comment: (NOTE) Calculated using the CKD-EPI Creatinine Equation (2021)    Anion gap 8 5 - 15    Comment: Performed at [facility] [address] [place], [place] [address]  Magnesium     Status: None   Collection Time: 11/12/20 12:52 AM  Result Value Ref Range   Magnesium 1.9 1.7 - 2.4 mg/dL    Comment: Performed at [facility] [address], [place], [place] [address]  Phosphorus     Status: None   Collection Time: 11/12/20 12:52 AM  Result Value Ref Range   Phosphorus 3.2 2.5 - 4.6 mg/dL    Comment: Performed at [facility] [address], [place], [place] [address]  TSH     Status: None   Collection Time: 11/12/20 12:52 AM  Result Value Ref Range   TSH 0.756 0.350 - 4.500 uIU/mL    Comment: Performed by a 3rd Generation assay with a functional sensitivity of <=0.01 uIU/mL. Performed at [facility], [place] [address], [place], [place] [address]   T4, free     Status: None   Collection Time: 11/12/20 12:52 AM  Result Value Ref Range   Free T4 1.06 0.61 - 1.12 ng/dL    Comment: (NOTE) Biotin ingestion may interfere with free T4 tests. If the results are inconsistent with the TSH level, previous test results, or the clinical presentation, then  consider biotin  interference. If needed, order repeat testing after stopping biotin. Performed at [facility], [place] [address], [place], [place] [address]     Current Facility-Administered Medications  Medication Dose Route Frequency Provider Last Rate Last Admin   acetaminophen (TYLENOL) tablet 650 mg  650 mg Oral Q6H PRN Opyd, Ilene Qua, MD   650 mg at 11/12/20 0050   Or   acetaminophen (TYLENOL) suppository 650 mg  650 mg Rectal Q6H PRN Opyd, Ilene Qua, MD       amoxicillin (AMOXIL) capsule 500 mg  500 mg Oral Q8H Elodia Florence., MD   500 mg at 11/13/20 1107   clonazePAM (KLONOPIN) tablet 0.5 mg  0.5 mg Oral BID PRN Vianne Bulls, MD   0.5 mg at 11/13/20 0031   divalproex (DEPAKOTE) DR tablet 500 mg  500 mg Oral Q12H France Ravens, MD   500 mg at 11/13/20 1105   feeding supplement (ENSURE ENLIVE / ENSURE PLUS) liquid 237 mL  237 mL Oral BID BM Elodia Florence., MD   237 mL at 11/13/20 1107   heparin injection 5,000 Units  5,000 Units Subcutaneous Q8H Opyd, Ilene Qua, MD   5,000 Units at 11/13/20 1325   ibuprofen (ADVIL) tablet 600 mg  600 mg Oral Q6H PRN Chotiner, Yevonne Aline, MD   600 mg at 11/12/20 0156   multivitamin with minerals tablet 1 tablet  1 tablet Oral Daily Elodia Florence., MD   1 tablet at 11/13/20 1107   OLANZapine (ZYPREXA) tablet 5 mg  5 mg Oral BID France Ravens, MD   5 mg at 11/13/20 1107   ondansetron (ZOFRAN) tablet 4 mg  4 mg Oral Q6H PRN Opyd, Ilene Qua, MD       Or   ondansetron (ZOFRAN) injection 4 mg  4 mg Intravenous Q6H PRN Opyd, Ilene Qua, MD       valbenazine [facility]) capsule 40 mg  40 mg Oral Daily France Ravens, MD   40 mg at 11/13/20 1107       Psychiatric Specialty Exam:  Presentation  General Appearance: Casual  Eye Contact:Good  Speech:Clear and Coherent  Speech Volume:Decreased   Mood and Affect  Mood:Euphoric  Affect:Constricted   Thought Process  Thought Processes:Goal Directed  Descriptions of  Associations:Loose  Orientation:Full (Time, Place and Person)  Thought Content:Obsessions  History of Schizophrenia/Schizoaffective disorder:Yes  Duration of Psychotic Symptoms:Less than six months  Hallucinations:No data recorded  Ideas of Reference:None  Suicidal Thoughts:No data recorded  Homicidal Thoughts:No data recorded   Sensorium  Memory:Immediate Fair; Recent Fair; Remote Fair  Judgment:Fair  Insight:Fair   Executive Functions  Concentration:Fair  Attention Span:Fair  [place]   Psychomotor Activity  Psychomotor Activity:No data recorded   Assets  Assets:Desire for Improvement   Sleep  Sleep:No data recorded   Physical Exam: Physical Exam Vitals reviewed.  Constitutional:      Appearance: Normal appearance.  HENT:     Head: Normocephalic and atraumatic.  Eyes:     Extraocular Movements: Extraocular movements intact.  Cardiovascular:     Rate and Rhythm: Normal rate and regular rhythm.  Pulmonary:     Effort: Pulmonary effort is normal.     Breath sounds: Normal breath sounds.  Abdominal:     General: Abdomen is flat.     Palpations: Abdomen is soft.  Musculoskeletal:     Cervical back: Normal range of motion.  Skin:  General: Skin is warm and dry.  Neurological:     General: No focal deficit present.     Mental Status: She is alert and oriented to person, place, and time.   Review of Systems  Constitutional:  Negative for chills and fever.  Respiratory:  Negative for cough, shortness of breath and wheezing.   Cardiovascular:  Negative for chest pain and palpitations.  Gastrointestinal:  Negative for abdominal pain, nausea and vomiting.  Skin:  Negative for itching and rash.  Neurological:  Negative for dizziness and headaches.  Blood pressure 117/73, pulse 69, temperature 98.2 F (36.8 C), temperature source Axillary, resp. rate 14, height 5\' 5"  (1.651 m), weight 68.6 kg, SpO2 92  %. Body mass index is 25.17 kg/m.  Treatment Plan Summary: Pt is 65 yo w/ hx of schizoaffective bipolar type admitted for acute encephalopathy and AKI. Auditory hallucination were not previously noted last [place] visit in May 2022. Patient sleeping appropriately and no longer appears manic. Pt doing well this am. Patient denies hallucinations. Appears psychiatrically stable   Schizoaffective Disorder, Bipolar Type -Zyprexa 5 mg bid for schizoaffective disorder -Depakote 500 mg bid for schizoaffective disorder -Valbenazine 40 mg qhs for hx of tardive dyskinesia -Recommend TOC consult to evaluate home situation -Signing off as patient is psychiatrically stable. Please re-consult as needed.  Disposition: No evidence of imminent risk to self or others at present.    Thank you for the consult France Ravens, MD PGY1 Psychiatry Resident 11/13/2020 1:52 PM

## 2020-11-13 NOTE — TOC Initial Note (Signed)
Transition of Care [facility]) - Initial/Assessment Note    Patient Details  Name: Karen Best MRN: [id number] Date of Birth: November 04, 1955  Transition of Care [facility]) CM/SW Contact:    Trula Ore, [place] Phone Number: 11/13/2020, 10:13 AM  Clinical Narrative:                  CSW received recommendation for possible ALF placement for patient  at time of discharge. CSW spoke with patient at beside regarding ALF recommendation at time of discharge. Patient comes from boarding house.  Patient expressed understanding of ALF recommendation and is agreeable to ALF placement at time of discharge. Patient gave CSW permission to fax out initial referral to [facility] in [place]. No further questions reported at this time. CSW to continue to follow and assist with discharge planning needs.   Expected Discharge Plan: Assisted Living Barriers to Discharge: Continued Medical Work up   Patient Goals and CMS Choice Patient states their goals for this hospitalization and ongoing recovery are:: patient wants to go to ALF CMS Medicare.gov Compare Post Acute Care list provided to:: Patient Choice offered to / list presented to : Patient  Expected Discharge Plan and Services Expected Discharge Plan: Assisted Living In-house Referral: Clinical Social Work     Living arrangements for the past 2 months: [place] (from [employer])                                      Prior Living Arrangements/Services Living arrangements for the past 2 months: [place] (from [employer]) Lives with:: Self, Roommate Patient language and need for interpreter reviewed:: Yes Do you feel safe going back to the place where you live?: No   ALF  Need for Family Participation in Patient Care: Yes (Comment) Care giver support system in place?: Yes (comment)   Criminal Activity/Legal Involvement Pertinent to Current Situation/Hospitalization: No - Comment as needed  Activities  of Daily Living Home Assistive Devices/Equipment: Eyeglasses, Environmental consultant (specify type) ADL Screening (condition at time of admission) Patient's cognitive ability adequate to safely complete daily activities?: No Is the patient deaf or have difficulty hearing?: Yes Does the patient have difficulty seeing, even when wearing glasses/contacts?: No Does the patient have difficulty concentrating, remembering, or making decisions?: Yes Patient able to express need for assistance with ADLs?: Yes Does the patient have difficulty dressing or bathing?: Yes Independently performs ADLs?: No Communication: Independent Dressing (OT): Needs assistance Is this a change from baseline?: Pre-admission baseline Grooming: Needs assistance Is this a change from baseline?: Pre-admission baseline Feeding: Independent Bathing: Needs assistance Is this a change from baseline?: Pre-admission baseline Toileting: Needs assistance Is this a change from baseline?: Pre-admission baseline In/Out Bed: Needs assistance Is this a change from baseline?: Pre-admission baseline Walks in Home: Needs assistance Is this a change from baseline?: Pre-admission baseline Does the patient have difficulty walking or climbing stairs?: Yes Weakness of Legs: Both Weakness of Arms/Hands: None  Permission Sought/Granted Permission sought to share information with : Case Manager, Family Supports, Chartered certified accountant granted to share information with : Yes, Verbal Permission Granted  Share Information with NAME: patient declined  Permission granted to share info w AGENCY: ALF  Permission granted to share info w Relationship: patient declined  Permission granted to share info w Contact Information: patient declined  Emotional Assessment Appearance:: Appears stated age Attitude/Demeanor/Rapport: Gracious Affect (typically  observed): Calm Orientation: : Oriented to Self, Oriented to Place, Oriented to Situation  (WDL) Alcohol / Substance Use: Not Applicable Psych Involvement: No (comment)  Admission diagnosis:  Toxic encephalopathy [G92.9] AKI (acute kidney injury) ([place]) [N17.9] Acute kidney injury ([place]) [N17.9] Altered mental status, unspecified altered mental status type [R41.82] Patient Active Problem List   Diagnosis Date Noted   Malnutrition of moderate degree 11/10/2020   Hypertension    Hyponatremia    Acute encephalopathy 09/18/2020   Schizoaffective disorder, bipolar type ([place]) 05/13/2020   Bacteremia due to Staphylococcus aureus 10/03/2019   Dehydration with hypernatremia 10/03/2019   AKI (acute kidney injury) ([place]) 10/02/2019   Transaminitis 10/02/2019   CVA (cerebral vascular accident) ([place]) 10/02/2019   AMS (altered mental status) 10/01/2019   Lithium toxicity 02/16/2019   Hyperkalemia 02/16/2019   Acute confusion 28/20/8138   Toxic metabolic encephalopathy 87/19/5974   Dysarthria 12/23/2018   Elevated blood pressure reading 12/23/2018   Asthma 12/23/2018   Delirium due to another medical condition    Acute UTI (urinary tract infection) 71/85/5015   Acute metabolic encephalopathy 86/82/5749   Positive hepatitis C antibody test 06/11/2018   PCP:  [facility]:   [id number] [address], [place] [address] Phone: [phone] Fax: [phone]     Social Determinants of Health (SDOH) Interventions    Readmission Risk Interventions Readmission Risk Prevention Plan 09/25/2020  Transportation Screening Complete  PCP or Specialist Appt within 5-7 Days Complete  Home Care Screening Complete  Medication Review (RN CM) Complete  Some recent data might be hidden

## 2020-11-13 NOTE — NC FL2 (Addendum)
[place] LEVEL OF CARE SCREENING TOOL     IDENTIFICATION  Patient Name: Karen Best Birthdate: 1956-03-03 Sex: female Admission Date (Current Location): 11/07/2020  [facility] and [place] Number:  Herbalist and Address:  [facility] [place], [place] [address] [place], [place] [address]      Provider Number: [id number]  Attending Physician Name and Address:  Elodia Florence., *  Relative Name and Phone Number:       Current Level of Care: Hospital Recommended Level of Care: [place] ([facility]) Prior Approval Number:    Date Approved/Denied:   PASRR Number:    Discharge Plan: Other (Comment) ([facility])    Current Diagnoses: Patient Active Problem List   Diagnosis Date Noted   Malnutrition of moderate degree 11/10/2020   Hypertension    Hyponatremia    Acute encephalopathy 09/18/2020   Schizoaffective disorder, bipolar type ([place]) 05/13/2020   Bacteremia due to Staphylococcus aureus 10/03/2019   Dehydration with hypernatremia 10/03/2019   AKI (acute kidney injury) ([place]) 10/02/2019   Transaminitis 10/02/2019   CVA (cerebral vascular accident) ([place]) 10/02/2019   AMS (altered mental status) 10/01/2019   Lithium toxicity 02/16/2019   Hyperkalemia 02/16/2019   Acute confusion 38/25/0539   Toxic metabolic encephalopathy 76/73/4193   Dysarthria 12/23/2018   Elevated blood pressure reading 12/23/2018   Asthma 12/23/2018   Delirium due to another medical condition    Acute UTI (urinary tract infection) 79/05/4095   Acute metabolic encephalopathy 35/32/9924   Positive hepatitis C antibody test 06/11/2018    Orientation RESPIRATION BLADDER Height & Weight   Self,Place,Time,Situation  Normal Incontinent, External catheter (External Urinary Catheter) Weight: 151 lb 3.8 oz (68.6 kg) Height:  5\' 5"  (165.1 cm)  BEHAVIORAL SYMPTOMS/MOOD NEUROLOGICAL BOWEL NUTRITION STATUS      Continent Diet  (Please see discharge summary)  AMBULATORY STATUS COMMUNICATION OF NEEDS Skin   Limited Assist Verbally Normal (WDL)                       Personal Care Assistance Level of Assistance  Bathing, Feeding, Dressing Bathing Assistance: Maximum assistance Feeding assistance: Independent (able to feed self) Dressing Assistance: Maximum assistance     Functional Limitations Info  Sight, Hearing, Speech Sight Info: Impaired Hearing Info: Adequate Speech Info: Adequate    SPECIAL CARE FACTORS FREQUENCY  PT (By licensed PT), OT (By licensed OT)     PT Frequency: 3x min weekly OT Frequency: 3x min weekly            Contractures Contractures Info: Not present    Additional Factors Info  Code Status, Allergies, Psychotropic Code Status Info: FULL Allergies Info: No Known Allergies Psychotropic Info: divalproex (DEPAKOTE) DR tablet 500 mg every 12 hours,OLANZapine (ZYPREXA) tablet 5 mg 2 times daily         Current Medications (11/13/2020):  This is the current hospital active medication list Current Facility-Administered Medications  Medication Dose Route Frequency Provider Last Rate Last Admin   acetaminophen (TYLENOL) tablet 650 mg  650 mg Oral Q6H PRN Opyd, Ilene Qua, MD   650 mg at 11/12/20 0050   Or   acetaminophen (TYLENOL) suppository 650 mg  650 mg Rectal Q6H PRN Opyd, Ilene Qua, MD       amoxicillin (AMOXIL) capsule 500 mg  500 mg Oral Q8H Elodia Florence., MD   500 mg at 11/13/20 1107   clonazePAM (  KLONOPIN) tablet 0.5 mg  0.5 mg Oral BID PRN Opyd, Ilene Qua, MD   0.5 mg at 11/13/20 0031   divalproex (DEPAKOTE) DR tablet 500 mg  500 mg Oral Q12H France Ravens, MD   500 mg at 11/13/20 1105   feeding supplement (ENSURE ENLIVE / ENSURE PLUS) liquid 237 mL  237 mL Oral BID BM Elodia Florence., MD   237 mL at 11/13/20 1107   heparin injection 5,000 Units  5,000 Units Subcutaneous Q8H Opyd, Ilene Qua, MD   5,000 Units at 11/13/20 0601   ibuprofen (ADVIL) tablet  600 mg  600 mg Oral Q6H PRN Chotiner, Yevonne Aline, MD   600 mg at 11/12/20 0156   multivitamin with minerals tablet 1 tablet  1 tablet Oral Daily Elodia Florence., MD   1 tablet at 11/13/20 1107   OLANZapine (ZYPREXA) tablet 5 mg  5 mg Oral BID France Ravens, MD   5 mg at 11/13/20 1107   ondansetron (ZOFRAN) tablet 4 mg  4 mg Oral Q6H PRN Opyd, Ilene Qua, MD       Or   ondansetron (ZOFRAN) injection 4 mg  4 mg Intravenous Q6H PRN Opyd, Ilene Qua, MD       valbenazine [facility]) capsule 40 mg  40 mg Oral Daily France Ravens, MD   40 mg at 11/13/20 1107     Discharge Medications: Please see discharge summary for a list of discharge medications.  Relevant Imaging Results:  Relevant Lab Results:   Additional Information [phone]  Trula Ore, LCSWA

## 2020-11-13 NOTE — Progress Notes (Signed)
Physical Therapy Treatment Patient Details Name: Karen Best MRN: [id number] DOB: 08/15/1955 Today's Date: 11/13/2020    History of Present Illness 65yo female admitted 09/18/20 with acute encephalopathy likely secondary to UTI. PMH Bipolar, fibromyalgia, schizophrenia, tobacco abuse    PT Comments    Pt with improved alertness and mobility. Pt has some very strange gait patterns that I believe are psych related. No obvious balance issues or weakness. Agree with plan for ALF and would recommend HHPT there initially to make sure she is mobilizing well there.   Follow Up Recommendations  Other (comment);Home health PT (agree with ALF)     Equipment Recommendations  None recommended by PT (rollator)    Recommendations for Other Services       Precautions / Restrictions Precautions Precautions: Fall    Mobility  Bed Mobility Overal bed mobility: Needs Assistance Bed Mobility: Supine to Sit     Supine to sit: Min assist     General bed mobility comments: Assist to elevate trunk    Transfers Overall transfer level: Needs assistance Equipment used: 4-wheeled walker Transfers: Sit to/from Stand Sit to Stand: Min guard         General transfer comment: Assist for safety  Ambulation/Gait Ambulation/Gait assistance: Min guard Gait Distance (Feet): 125 Feet Assistive device: 4-wheeled walker Gait Pattern/deviations: Decreased step length - right;Decreased step length - left;Shuffle;Step-through pattern Gait velocity: decr Gait velocity interpretation: <1.31 ft/sec, indicative of household ambulator General Gait Details: Assist for safety. Pt with variable gait pattern. Started with almost normal stride length. Then after ~15 feet she began with rhythmic side to side hip movement with very small steps almost like dancing. She then resumed a fairly normal step through pattern. Verbal cues to stay closer to rollator.   Stairs             Wheelchair Mobility     Modified Rankin (Stroke Patients Only)       Balance Overall balance assessment: Needs assistance Sitting-balance support: No upper extremity supported;Feet supported Sitting balance-Leahy Scale: Good     Standing balance support: No upper extremity supported Standing balance-Leahy Scale: Poor                              Cognition Arousal/Alertness: Awake/alert Behavior During Therapy: Flat affect Overall Cognitive Status: No family/caregiver present to determine baseline cognitive functioning                                 General Comments: pt with psych history. Followed 1 step commands      Exercises      General Comments        Pertinent Vitals/Pain Pain Assessment: No/denies pain    Home Living                      Prior Function            PT Goals (current goals can now be found in the care plan section) Acute Rehab PT Goals Patient Stated Goal: not stated Progress towards PT goals: Progressing toward goals    Frequency    Min 3X/week      PT Plan Discharge plan needs to be updated    Co-evaluation              AM-PAC PT "6 Clicks" Mobility   Outcome Measure  Help  needed turning from your back to your side while in a flat bed without using bedrails?: A Little Help needed moving from lying on your back to sitting on the side of a flat bed without using bedrails?: A Little Help needed moving to and from a bed to a chair (including a wheelchair)?: A Little Help needed standing up from a chair using your arms (e.g., wheelchair or bedside chair)?: A Little Help needed to walk in hospital room?: A Little Help needed climbing 3-5 steps with a railing? : A Little 6 Click Score: 18    End of Session Equipment Utilized During Treatment: Gait belt Activity Tolerance: Patient tolerated treatment well Patient left: in chair;with call bell/phone within reach;with chair alarm set Nurse Communication:  Mobility status;Other (comment) (bed wet and needs change) PT Visit Diagnosis: Other abnormalities of gait and mobility (R26.89);Muscle weakness (generalized) (M62.81)     Time: 8118-8677 PT Time Calculation (min) (ACUTE ONLY): 18 min  Charges:  $Gait Training: 8-22 mins                     Rudolph Pager [phone] Office [place] 11/13/2020, 4:47 PM

## 2020-11-14 DIAGNOSIS — N179 Acute kidney failure, unspecified: Secondary | ICD-10-CM | POA: Diagnosis not present

## 2020-11-14 LAB — COMPREHENSIVE METABOLIC PANEL WITH GFR
ALT: 35 U/L (ref 0–44)
AST: 30 U/L (ref 15–41)
Albumin: 3 g/dL — ABNORMAL LOW (ref 3.5–5.0)
Alkaline Phosphatase: 61 U/L (ref 38–126)
Anion gap: 8 (ref 5–15)
BUN: 15 mg/dL (ref 8–23)
CO2: 23 mmol/L (ref 22–32)
Calcium: 10.2 mg/dL (ref 8.9–10.3)
Chloride: 105 mmol/L (ref 98–111)
Creatinine, Ser: 0.76 mg/dL (ref 0.44–1.00)
GFR, Estimated: >60 mL/min
Glucose, Bld: 122 mg/dL — ABNORMAL HIGH (ref 70–99)
Potassium: 4.7 mmol/L (ref 3.5–5.1)
Sodium: 136 mmol/L (ref 135–145)
Total Bilirubin: 0.6 mg/dL (ref 0.3–1.2)
Total Protein: 7.2 g/dL (ref 6.5–8.1)

## 2020-11-14 LAB — CBC WITH DIFFERENTIAL/PLATELET
Abs Immature Granulocytes: 0.05 10*3/uL (ref 0.00–0.07)
Basophils Absolute: 0 10*3/uL (ref 0.0–0.1)
Basophils Relative: 0 %
Eosinophils Absolute: 0.8 10*3/uL — ABNORMAL HIGH (ref 0.0–0.5)
Eosinophils Relative: 11 %
HCT: 41.9 % (ref 36.0–46.0)
Hemoglobin: 12.8 g/dL (ref 12.0–15.0)
Immature Granulocytes: 1 %
Lymphocytes Relative: 48 %
Lymphs Abs: 3.2 10*3/uL (ref 0.7–4.0)
MCH: 22.6 pg — ABNORMAL LOW (ref 26.0–34.0)
MCHC: 30.5 g/dL (ref 30.0–36.0)
MCV: 73.9 fL — ABNORMAL LOW (ref 80.0–100.0)
Monocytes Absolute: 0.6 10*3/uL (ref 0.1–1.0)
Monocytes Relative: 9 %
Neutro Abs: 2.1 10*3/uL (ref 1.7–7.7)
Neutrophils Relative %: 31 %
Platelets: 249 10*3/uL (ref 150–400)
RBC: 5.67 MIL/uL — ABNORMAL HIGH (ref 3.87–5.11)
RDW: 16.8 % — ABNORMAL HIGH (ref 11.5–15.5)
WBC: 6.8 10*3/uL (ref 4.0–10.5)
nRBC: 0 % (ref 0.0–0.2)

## 2020-11-14 MED ORDER — METOPROLOL TARTRATE 12.5 MG HALF TABLET
12.5000 mg | ORAL_TABLET | Freq: Two times a day (BID) | ORAL | Status: DC
  Administered 2020-11-14 – 2020-11-17 (×6): 12.5 mg via ORAL
  Filled 2020-11-14 (×6): qty 1

## 2020-11-14 NOTE — Progress Notes (Signed)
PROGRESS NOTE    Karen Best  RCV:[id number] DOB: 1955/06/11 DOA: 11/07/2020 PCP: [facility]    Chief Complaint  Patient presents with   Altered Mental Status    Brief Narrative:   History of schizophrenia, CVA, hypertension present with somnolence, acute encephalopathy, seen by psych due to concern for psychosis  Also treated for AKI and UTI Improved, psych signed off on 7/19, awaiting for ALF placement  Subjective:  She is drowsy, does not answer question, does not follow command, no agitation  Assessment & Plan:   Principal Problem:   AKI (acute kidney injury) ([place]) Active Problems:   CVA (cerebral vascular accident) ([place])   Schizoaffective disorder, bipolar type ([place])   Acute encephalopathy   Malnutrition of moderate degree  Acute encephalopathy present on admission/concern for psychosis/schizophrenia -No acute finding on CT head -UDS negative, B12 within normal limit, RPR nonreactive, ammonia was mildly elevated initially on repeat was normal -On Depakote, Zyprexa, valbenazine per psych recommendation , hallucination mania improved per psych, psych signed off on 7/19  Abnormal thyroid function test -On presentation -Thyroid function test normalized on repeat -Follow-up outpatient   Aerococcus UTI -On amoxicillin, finished treatment on 7/21  AKI, on presentation -BUN 40/creatinine 4 on presentation -BUN/creatinine has normalized  Mild hyponatremia -Resolved  History of hypertension Presented with hypotension Home blood pressure medication held on presentation Blood pressure start to trend up, start low-dose Lopressor  Nutritional Assessment: The patient's BMI is: Body mass index is 25.46 kg/m.Marland Kitchen Seen by dietician.  I agree with the assessment and plan as outlined below: Nutrition Status: Nutrition Problem: Moderate Malnutrition Etiology: chronic illness (asthma, schizophrenia, Bipolar disorder) Signs/Symptoms: mild  fat depletion, mild muscle depletion Interventions: Ensure Enlive (each supplement provides 350kcal and 20 grams of protein), MVI, Liberalize Diet  .     Unresulted Labs (From admission, onward)     Start     Ordered   11/08/20 0500  CBC  Daily,   R      11/08/20 0136              DVT prophylaxis: heparin injection 5,000 Units Start: 11/08/20 0600   Code Status: Full Family Communication: Patient Disposition:   Status is: Inpatient   Dispo: The patient is from: From boardinghouse              Anticipated d/c is to: ALF              Anticipated d/c date is: Medically stable to discharge, awaiting for ALF placement                Consultants:  Psychiatry  Procedures:  None  Antimicrobials:   Anti-infectives (From admission, onward)    Start     Dose/Rate Route Frequency Ordered Stop   11/11/20 1630  amoxicillin (AMOXIL) capsule 500 mg        500 mg Oral Every 8 hours 11/11/20 1535 11/16/20 1359           Objective: Vitals:   11/13/20 0554 11/13/20 1400 11/14/20 0521 11/14/20 0805  BP: 117/73 121/76 124/73 (!) 145/80  Pulse: 69 82 82 65  Resp: 14 19 16 13   Temp: 98.2 F (36.8 C) 97.9 F (36.6 C) 97.8 F (36.6 C) 97.8 F (36.6 C)  TempSrc: Axillary Axillary Oral Oral  SpO2: 92%     Weight: 68.6 kg  69.4 kg   Height:        Intake/Output Summary (Last 24 hours)  at 11/14/2020 1909 Last data filed at 11/14/2020 1530 Gross per 24 hour  Intake 717 ml  Output 1200 ml  Net -483 ml   Filed Weights   11/12/20 0557 11/13/20 0554 11/14/20 0521  Weight: 70.7 kg 68.6 kg 69.4 kg    Examination:  General exam: No agitation, she is lethargic, she does not talk, does not follow command Respiratory system: Clear to auscultation. Respiratory effort normal. Cardiovascular system: RRR. No pedal edema. Gastrointestinal system: Abdomen is nondistended, soft and nontender. No organomegaly or masses felt. Normal bowel sounds heard. Central nervous system:  Lethargic  Extremities: Generalized weakness Skin: No rashes, lesions or ulcers Psychiatry: Lethargic    Data Reviewed: I have personally reviewed following labs and imaging studies  CBC: Recent Labs  Lab 11/07/20 2127 11/07/20 2141 11/09/20 0236 11/10/20 0223 11/11/20 0114 11/12/20 0052 11/14/20 0045  WBC 8.6   < > 6.5 8.3 6.1 7.4 6.8  NEUTROABS 3.2  --   --  2.5 2.0 2.6 2.1  HGB 13.5   < > 11.7* 11.9* 12.4 13.6 12.8  HCT 43.4   < > 37.2 36.9 39.4 43.2 41.9  MCV 75.2*   < > 73.4* 72.1* 73.0* 72.7* 73.9*  PLT 260   < > 247 203 216 230 249   < > = values in this interval not displayed.    Basic Metabolic Panel: Recent Labs  Lab 11/09/20 0236 11/10/20 0223 11/11/20 0114 11/12/20 0052 11/14/20 0045  NA 139 137 133* 137 136  K 4.0 4.0 3.8 4.5 4.7  CL 115* 110 105 105 105  CO2 18* 21* 23 24 23   GLUCOSE 113* 91 107* 108* 122*  BUN 20 13 11 16 15   CREATININE 0.92 0.70 0.70 0.69 0.76  CALCIUM 9.5 9.8 9.7 10.2 10.2  MG  --  1.7 1.7 1.9  --   PHOS  --  2.3* 3.0 3.2  --     GFR: Estimated Creatinine Clearance: 69.5 mL/min (by C-G formula based on SCr of 0.76 mg/dL).  Liver Function Tests: Recent Labs  Lab 11/07/20 2127 11/10/20 0223 11/11/20 0114 11/12/20 0052 11/14/20 0045  AST 23 21 19  38 30  ALT 23 18 18  33 35  ALKPHOS 63 60 66 69 61  BILITOT 0.8 0.4 0.4 0.4 0.6  PROT 8.1 6.6 6.6 7.0 7.2  ALBUMIN 3.7 2.8* 2.8* 3.0* 3.0*    CBG: Recent Labs  Lab 11/07/20 2131  GLUCAP 85     Recent Results (from the past 240 hour(s))  Blood culture (routine x 2)     Status: None   Collection Time: 11/07/20 11:55 PM   Specimen: BLOOD RIGHT WRIST  Result Value Ref Range Status   Specimen Description BLOOD RIGHT WRIST  Final   Special Requests   Final    BOTTLES DRAWN AEROBIC AND ANAEROBIC Blood Culture adequate volume   Culture   Final    NO GROWTH 5 DAYS Performed at [facility], [address], [place], [place] [address]    Report Status 11/13/2020  FINAL  Final  SARS CORONAVIRUS 2 (TAT 6-24 HRS) Nasopharyngeal Nasopharyngeal Swab     Status: None   Collection Time: 11/08/20 12:52 AM   Specimen: Nasopharyngeal Swab  Result Value Ref Range Status   SARS Coronavirus 2 NEGATIVE NEGATIVE Final    Comment: (NOTE) SARS-CoV-2 target nucleic acids are NOT DETECTED.  The SARS-CoV-2 RNA is generally detectable in upper and lower respiratory specimens during the acute phase of infection. Negative  results do not preclude SARS-CoV-2 infection, do not rule out co-infections with other pathogens, and should not be used as the sole basis for treatment or other patient management decisions. Negative results must be combined with clinical observations, patient history, and epidemiological information. The expected result is Negative.  Fact Sheet for Patients: SugarRoll.be  Fact Sheet for Healthcare Providers: https://www.woods-mathews.com/  This test is not yet approved or cleared by the [place] FDA and  has been authorized for detection and/or diagnosis of SARS-CoV-2 by FDA under an Emergency Use Authorization (EUA). This EUA will remain  in effect (meaning this test can be used) for the duration of the COVID-19 declaration under Se ction 564(b)(1) of the Act, 21 U.S.C. section 360bbb-3(b)(1), unless the authorization is terminated or revoked sooner.  Performed at [facility], [place] [address], [place], [place] [address]   Urine Culture     Status: Abnormal   Collection Time: 11/08/20  8:45 AM   Specimen: Urine, Clean Catch  Result Value Ref Range Status   Specimen Description URINE, CLEAN CATCH  Final   Special Requests NONE  Final   Culture (A)  Final    >=100,000 COLONIES/mL AEROCOCCUS SPECIES Standardized susceptibility testing for this organism is not available. Performed at [facility], [place] [address], [place],  [address]    Report Status 11/09/2020 FINAL  Final          Radiology Studies: No results found.      Scheduled Meds:  amoxicillin  500 mg Oral Q8H   divalproex  500 mg Oral Q12H   feeding supplement  237 mL Oral BID BM   heparin  5,000 Units Subcutaneous Q8H   multivitamin with minerals  1 tablet Oral Daily   OLANZapine  5 mg Oral BID   valbenazine  40 mg Oral Daily   Continuous Infusions:   LOS: 6 days   Time spent: 25mins Greater than 50% of this time was spent in counseling, explanation of diagnosis, planning of further management, and coordination of care.   Voice Recognition Viviann Spare dictation system was used to create this note, attempts have been made to correct errors. Please contact the author with questions and/or clarifications.   Florencia Reasons, MD PhD FACP [facility]  Available via Epic secure chat 7am-7pm for nonurgent issues Please page for urgent issues To page the attending provider between 7A-7P or the covering provider during after hours 7P-7A, please log into the web site [email or web address] and access using universal [place] password for that web site. If you do not have the password, please call the hospital operator.    11/14/2020, 7:09 PM

## 2020-11-14 NOTE — TOC Progression Note (Addendum)
Transition of Care [facility]) - Progression Note    Patient Details  Name: Karen Best MRN: [id number] Date of Birth: [birth year]  Transition of Care [facility]) CM/SW [place], [place] Phone Number: 11/14/2020, 11:08 AM  Clinical Narrative:     Update-CSW received call from Hydia with [facility] regarding referral for possible ALF placement. Hydia is going to stop by in the morning to complete assessment with patient to determine ALF placement. CSW will continue to follow and assist with dc planning needs.  Update- CSW tried to call Hazel with [facility] back to check on referral status. No answer, and voicemail is full. CSW called main number at [facility]. ALF confirmed Onalee Hua is not at the office, ALF provided CSW with a number for [facility], same number that CSW tried to call previously. CSW still awaiting callback from ALF with determination for possible ALF placement for patient. CSW updated patient. CSW asked patient for permission to fax out referral for possible ALF placement to [facility]. Patient gave CSW permission to fax out referral. CSW called Hydia with [facility]. Hydia confirmed for CSW to fax referral over and provided fax number to send over referral too. Hydia confirmed with CSW that  she will call CSW back with ALF referral determination. CSW awaiting callback  Update- CSW spoke with Onalee Hua at [facility] who confirmed with CSW that they are still reviewing referral. Onalee Hua confirmed she will call CSW back with determination of referral. CSW awaiting callback.  CSW called [facility] to check on status of patients referral. CSW left voicemail for [employer] with [facility]. CSW awaiting callback. CSW will continue to follow and assist with dc planning needs.   Expected Discharge Plan: Assisted Living Barriers to Discharge: Continued Medical Work up  Expected Discharge Plan and Services Expected Discharge Plan: Assisted Living In-house Referral:  Clinical Social Work     Living arrangements for the past 2 months: Single Family Home (from [facility])                                       Social Determinants of Health ([place]) Interventions    Readmission Risk Interventions Readmission Risk Prevention Plan 09/25/2020  Transportation Screening Complete  PCP or Specialist Appt within 5-7 Days Complete  Home Care Screening Complete  Medication Review (RN CM) Complete  Some recent data might be hidden

## 2020-11-15 DIAGNOSIS — N179 Acute kidney failure, unspecified: Secondary | ICD-10-CM | POA: Diagnosis not present

## 2020-11-15 MED ORDER — TUBERCULIN PPD 5 UNIT/0.1ML ID SOLN
5.0000 [IU] | Freq: Once | INTRADERMAL | Status: AC
  Administered 2020-11-15: 5 [IU] via INTRADERMAL
  Filled 2020-11-15: qty 0.1

## 2020-11-15 NOTE — Progress Notes (Signed)
Physical Therapy Treatment Patient Details Name: YAMILET MCFAYDEN MRN: [id number] DOB: [birth year] Today's Date: 11/15/2020    History of Present Illness 65yo female admitted 09/18/20 with acute encephalopathy likely secondary to UTI. PMH Bipolar, fibromyalgia, schizophrenia, tobacco abuse    PT Comments    Pt tolerates treatment well but continues to demonstrate significant deficits in safety awareness. Pt attempting to sit prior to making contact with desired surface of destination, also bumping into multiple objects when ambulating. Pt continues to demonstrate excessive lateral hip movement with waddling gait. PT continues to recommend ALF placement with HHPT services. PT updating DME recommendations to rolling walker as pt demonstrates impaired use of brakes on 4 wheeled walker with seat.    Follow Up Recommendations  Other (comment);Home health PT;Supervision for mobility/OOB (ALF)     Equipment Recommendations  Rolling walker with 5" wheels    Recommendations for Other Services       Precautions / Restrictions Precautions Precautions: Fall Restrictions Weight Bearing Restrictions: No    Mobility  Bed Mobility Overal bed mobility: Needs Assistance Bed Mobility: Supine to Sit     Supine to sit: Min assist          Transfers Overall transfer level: Needs assistance Equipment used: 4-wheeled walker Transfers: Sit to/from Stand Sit to Stand: Min guard         General transfer comment: cues for brake use however pt is unable to follow when transferring  Ambulation/Gait Ambulation/Gait assistance: Min assist Gait Distance (Feet): 20 Feet (20' x 2, 15' x1) Assistive device: 4-wheeled walker Gait Pattern/deviations: Step-to pattern;Shuffle Gait velocity: decr Gait velocity interpretation: <1.8 ft/sec, indicate of risk for recurrent falls General Gait Details: pt with short shuffling gait, increased lateral hip displacement with out-toeing bilaterally. Pt bumping  into objects with rollator and requiring assistance to back up and navigate around obstacles   Stairs             Wheelchair Mobility    Modified Rankin (Stroke Patients Only)       Balance Overall balance assessment: Needs assistance Sitting-balance support: No upper extremity supported;Feet supported Sitting balance-Leahy Scale: Good     Standing balance support: No upper extremity supported;Single extremity supported Standing balance-Leahy Scale: Poor Standing balance comment: reliant on UE support to maintain balance with minG                            Cognition Arousal/Alertness: Awake/alert Behavior During Therapy: Flat affect Overall Cognitive Status: No family/caregiver present to determine baseline cognitive functioning                                 General Comments: pt with psych history. Followed 1 step commands. Minimal verbal communication, impaired safety awareness with use of rollator and when transferring      Exercises      General Comments General comments (skin integrity, edema, etc.): VSS on RA      Pertinent Vitals/Pain Pain Assessment: No/denies pain    Home Living                      Prior Function            PT Goals (current goals can now be found in the care plan section) Acute Rehab PT Goals Patient Stated Goal: go to bathroom Progress towards PT goals: Progressing toward goals  Frequency    Min 3X/week      PT Plan Current plan remains appropriate    Co-evaluation              AM-PAC PT "6 Clicks" Mobility   Outcome Measure  Help needed turning from your back to your side while in a flat bed without using bedrails?: A Little Help needed moving from lying on your back to sitting on the side of a flat bed without using bedrails?: A Little Help needed moving to and from a bed to a chair (including a wheelchair)?: A Little Help needed standing up from a chair using your  arms (e.g., wheelchair or bedside chair)?: A Little Help needed to walk in hospital room?: A Little Help needed climbing 3-5 steps with a railing? : A Little 6 Click Score: 18    End of Session Equipment Utilized During Treatment: Gait belt Activity Tolerance: Patient tolerated treatment well Patient left: in chair;with call bell/phone within reach;with chair alarm set Nurse Communication: Mobility status PT Visit Diagnosis: Other abnormalities of gait and mobility (R26.89);Muscle weakness (generalized) (M62.81)     Time: 4643-1427 PT Time Calculation (min) (ACUTE ONLY): 23 min  Charges:  $Gait Training: 8-22 mins $Therapeutic Activity: 8-22 mins                     Zenaida Niece, PT, DPT Acute Rehabilitation Pager: [phone]    Zenaida Niece 11/15/2020, 12:11 PM

## 2020-11-15 NOTE — Progress Notes (Signed)
PROGRESS NOTE    Karen Best  YKD:[id number] DOB: [birth year] DOA: 11/07/2020 PCP: [facility]    Chief Complaint  Patient presents with   Altered Mental Status    Brief Narrative:   History of schizophrenia, CVA, hypertension present with somnolence, acute encephalopathy, seen by psych due to concern for psychosis  Also treated for AKI and UTI Improved, psych signed off on 7/19, awaiting for ALF placement  Subjective:  She is alert this am, flat affect, working with PT, does appear to have psychomotor retardation Does not talk,  no agitation  Assessment & Plan:   Principal Problem:   AKI (acute kidney injury) ([place]) Active Problems:   CVA (cerebral vascular accident) ([place])   Schizoaffective disorder, bipolar type ([place])   Acute encephalopathy   Malnutrition of moderate degree  Acute encephalopathy present on admission/concern for psychosis/schizophrenia -No acute finding on CT head -UDS negative, B12 within normal limit, RPR nonreactive, ammonia was mildly elevated initially on repeat was normal -On Depakote, Zyprexa, valbenazine per psych recommendation , hallucination mania improved per psych, psych signed off on 7/19  Abnormal thyroid function test -On presentation -Thyroid function test normalized on repeat -Follow-up outpatient   Aerococcus UTI -On amoxicillin, finished treatment on 7/21  AKI, on presentation -BUN 40/creatinine 4 on presentation -BUN/creatinine has normalized  Mild hyponatremia -Resolved  History of hypertension Presented with hypotension Home blood pressure medication held on presentation Blood pressure start to trend up, start low-dose Lopressor  Nutritional Assessment: The patient's BMI is: Body mass index is 24.3 kg/m.Marland Kitchen Seen by dietician.  I agree with the assessment and plan as outlined below: Nutrition Status: Nutrition Problem: Moderate Malnutrition Etiology: chronic illness (asthma,  schizophrenia, Bipolar disorder) Signs/Symptoms: mild fat depletion, mild muscle depletion Interventions: Ensure Enlive (each supplement provides 350kcal and 20 grams of protein), MVI, Liberalize Diet  .     Unresulted Labs (From admission, onward)     Start     Ordered   11/08/20 0500  CBC  Daily,   R      11/08/20 0136              DVT prophylaxis: heparin injection 5,000 Units Start: 11/08/20 0600   Code Status: Full Family Communication: Patient Disposition:   Status is: Inpatient   Dispo: The patient is from: From boardinghouse              Anticipated d/c is to: ALF              Anticipated d/c date is: Medically stable to discharge, awaiting for ALF placement                Consultants:  Psychiatry  Procedures:  None  Antimicrobials:   Anti-infectives (From admission, onward)    Start     Dose/Rate Route Frequency Ordered Stop   11/11/20 1630  amoxicillin (AMOXIL) capsule 500 mg        500 mg Oral Every 8 hours 11/11/20 1535 11/16/20 1359           Objective: Vitals:   11/14/20 2011 11/15/20 0536 11/15/20 0827 11/15/20 1150  BP: 128/83 (!) 142/85 (!) 134/94 125/80  Pulse: 60 71  61  Resp: 13 16 14 16   Temp: (!) 97.4 F (36.3 C) (!) 97.4 F (36.3 C) 97.6 F (36.4 C) (!) 97.5 F (36.4 C)  TempSrc: Oral Oral Oral Oral  SpO2: 98% 97% 97% 96%  Weight:  66.2 kg    Height:  Intake/Output Summary (Last 24 hours) at 11/15/2020 1247 Last data filed at 11/15/2020 1154 Gross per 24 hour  Intake 960 ml  Output 2600 ml  Net -1640 ml   Filed Weights   11/13/20 0554 11/14/20 0521 11/15/20 0536  Weight: 68.6 kg 69.4 kg 66.2 kg    Examination:  General exam: No agitation, flat affect Respiratory system: Clear to auscultation. Respiratory effort normal. Cardiovascular system: RRR. No pedal edema. Gastrointestinal system: Abdomen is nondistended, soft and nontender. No organomegaly or masses felt. Normal bowel sounds heard. Central  nervous system: alert Extremities: Generalized weakness Skin: No rashes, lesions or ulcers Psychiatry: alert    Data Reviewed: I have personally reviewed following labs and imaging studies  CBC: Recent Labs  Lab 11/09/20 0236 11/10/20 0223 11/11/20 0114 11/12/20 0052 11/14/20 0045  WBC 6.5 8.3 6.1 7.4 6.8  NEUTROABS  --  2.5 2.0 2.6 2.1  HGB 11.7* 11.9* 12.4 13.6 12.8  HCT 37.2 36.9 39.4 43.2 41.9  MCV 73.4* 72.1* 73.0* 72.7* 73.9*  PLT 247 203 216 230 073    Basic Metabolic Panel: Recent Labs  Lab 11/09/20 0236 11/10/20 0223 11/11/20 0114 11/12/20 0052 11/14/20 0045  NA 139 137 133* 137 136  K 4.0 4.0 3.8 4.5 4.7  CL 115* 110 105 105 105  CO2 18* 21* 23 24 23   GLUCOSE 113* 91 107* 108* 122*  BUN 20 13 11 16 15   CREATININE 0.92 0.70 0.70 0.69 0.76  CALCIUM 9.5 9.8 9.7 10.2 10.2  MG  --  1.7 1.7 1.9  --   PHOS  --  2.3* 3.0 3.2  --     GFR: Estimated Creatinine Clearance: 63.9 mL/min (by C-G formula based on SCr of 0.76 mg/dL).  Liver Function Tests: Recent Labs  Lab 11/10/20 0223 11/11/20 0114 11/12/20 0052 11/14/20 0045  AST 21 19 38 30  ALT 18 18 33 35  ALKPHOS 60 66 69 61  BILITOT 0.4 0.4 0.4 0.6  PROT 6.6 6.6 7.0 7.2  ALBUMIN 2.8* 2.8* 3.0* 3.0*    CBG: No results for input(s): GLUCAP in the last 168 hours.    Recent Results (from the past 240 hour(s))  Blood culture (routine x 2)     Status: None   Collection Time: 11/07/20 11:55 PM   Specimen: BLOOD RIGHT WRIST  Result Value Ref Range Status   Specimen Description BLOOD RIGHT WRIST  Final   Special Requests   Final    BOTTLES DRAWN AEROBIC AND ANAEROBIC Blood Culture adequate volume   Culture   Final    NO GROWTH 5 DAYS Performed at [facility], [address], [place], [place] [address]    Report Status 11/13/2020 FINAL  Final  SARS CORONAVIRUS 2 (TAT 6-24 HRS) Nasopharyngeal Nasopharyngeal Swab     Status: None   Collection Time: 11/08/20 12:52 AM   Specimen:  Nasopharyngeal Swab  Result Value Ref Range Status   SARS Coronavirus 2 NEGATIVE NEGATIVE Final    Comment: (NOTE) SARS-CoV-2 target nucleic acids are NOT DETECTED.  The SARS-CoV-2 RNA is generally detectable in upper and lower respiratory specimens during the acute phase of infection. Negative results do not preclude SARS-CoV-2 infection, do not rule out co-infections with other pathogens, and should not be used as the sole basis for treatment or other patient management decisions. Negative results must be combined with clinical observations, patient history, and epidemiological information. The expected result is Negative.  Fact Sheet for Patients: SugarRoll.be  Fact Sheet  for Healthcare Providers: https://www.woods-mathews.com/  This test is not yet approved or cleared by the [place] and  has been authorized for detection and/or diagnosis of SARS-CoV-2 by FDA under an Emergency Use Authorization (EUA). This EUA will remain  in effect (meaning this test can be used) for the duration of the COVID-19 declaration under Se ction 564(b)(1) of the Act, 21 U.S.C. section 360bbb-3(b)(1), unless the authorization is terminated or revoked sooner.  Performed at [facility], [place] [address] [place], [place] [address]   Urine Culture     Status: Abnormal   Collection Time: 11/08/20  8:45 AM   Specimen: Urine, Clean Catch  Result Value Ref Range Status   Specimen Description URINE, CLEAN CATCH  Final   Special Requests NONE  Final   Culture (A)  Final    >=100,000 COLONIES/mL AEROCOCCUS SPECIES Standardized susceptibility testing for this organism is not available. Performed at [facility], [place] [address] [place], [place] [address]    Report Status 11/09/2020 FINAL  Final         Radiology Studies: No results found.      Scheduled Meds:  amoxicillin  500 mg Oral Q8H   divalproex  500 mg Oral Q12H    feeding supplement  237 mL Oral BID BM   heparin  5,000 Units Subcutaneous Q8H   metoprolol tartrate  12.5 mg Oral BID   multivitamin with minerals  1 tablet Oral Daily   OLANZapine  5 mg Oral BID   valbenazine  40 mg Oral Daily   Continuous Infusions:   LOS: 7 days   Time spent: 3mins Greater than 50% of this time was spent in counseling, explanation of diagnosis, planning of further management, and coordination of care.   Voice Recognition Viviann Spare dictation system was used to create this note, attempts have been made to correct errors. Please contact the author with questions and/or clarifications.   Florencia Reasons, MD PhD FACP [facility]  Available via Epic secure chat 7am-7pm for nonurgent issues Please page for urgent issues To page the attending provider between 7A-7P or the covering provider during after hours 7P-7A, please log into the web site [email or web address] and access using universal [place] password for that web site. If you do not have the password, please call the hospital operator.    11/15/2020, 12:47 PM

## 2020-11-15 NOTE — Progress Notes (Signed)
Nutrition Follow-up  DOCUMENTATION CODES:   Non-severe (moderate) malnutrition in context of chronic illness  INTERVENTION:   Ensure Enlive po BID, each supplement provides 350 kcal and 20 grams of protein. Continue dysphagia 3 (mechanical soft) with thin liquids. MVI with minerals daily. Lactaid with meals.  NUTRITION DIAGNOSIS:   Moderate Malnutrition related to chronic illness (asthma, schizophrenia, Bipolar disorder) as evidenced by mild fat depletion, mild muscle depletion.  Ongoing  GOAL:   Patient will meet greater than or equal to 90% of their needs  Progressing   MONITOR:   PO intake, Supplement acceptance  REASON FOR ASSESSMENT:   Malnutrition Screening Tool    ASSESSMENT:   65 yo female admitted with AMS. PMH includes schizophrenia, CVA, HTN, tobacco abuse, asthma, Bipolar disorder, fibromyalgia.  Patient somewhat drowsy, did not answer RD questions. Spoke with RN. Patient is eating okay and drinking the Ensure supplements BID.   Diet: Dysphagia 3 diet with thin liquids; Ensure Enlive/Plus BID; Lactaid with meals Meal intakes: 100% x last 3 meals documented  Labs and medications reviewed.  Admission weight 67 kg Current weight 66.2 kg  Diet Order:   Diet Order             DIET DYS 3 Room service appropriate? Yes with Assist; Fluid consistency: Thin  Diet effective now                   EDUCATION NEEDS:   Not appropriate for education at this time  Skin:  Skin Assessment: Reviewed RN Assessment  Last BM:  7/18 type 2  Height:   Ht Readings from Last 1 Encounters:  11/08/20 5\' 5"  (1.651 m)    Weight:   Wt Readings from Last 1 Encounters:  11/15/20 66.2 kg    Ideal Body Weight:  56.8 kg  BMI:  Body mass index is 24.3 kg/m.  Estimated Nutritional Needs:   Kcal:  1700-1900  Protein:  85-95 gm  Fluid:  >/= 1.7 L    Lucas Mallow, RD, LDN, CNSC Please refer to Amion for contact information.

## 2020-11-15 NOTE — TOC Progression Note (Addendum)
Transition of Care [facility]) - Progression Note    Patient Details  Name: Karen Best MRN: [id number] Date of Birth: 1956/01/08  Transition of Care [facility]) CM/SW [place], [place] Phone Number: 11/15/2020, 1:42 PM  Clinical Narrative:     Update- CSW received call from MD. Patient has now decided that she does want to go to ALF. CSW called Hydia with [facility] to let her know. Hydia needs patients landords number to check patients funds. CSW spoke with patient at bedside. Patient confirmed with CSW that she does want to go to ALF. Patient gave CSW her landlords name and number to give Hydia. Hydia will call CSW back with determination of ALF placement. Hydia reported if patient is accepted that she will be able to accept patient on Monday.Hydia reported patient will need tb test and covid test. CSW awaiting callback with determination. MD,RN, and CM informed.  CSW received call from [facility] with [employer]. Hydia confirmed she completed assessment with patient. After completing assessment Hydia reports to CSW that patient has decided that she wants to go home. CSW informed MD, RN, and case Freight forwarder.  Expected Discharge Plan: Assisted Living Barriers to Discharge: Continued Medical Work up  Expected Discharge Plan and Services Expected Discharge Plan: Assisted Living In-house Referral: Clinical Social Work     Living arrangements for the past 2 months: Single Family Home (from [facility])                                       Social Determinants of Health ([place]) Interventions    Readmission Risk Interventions Readmission Risk Prevention Plan 09/25/2020  Transportation Screening Complete  PCP or Specialist Appt within 5-7 Days Complete  Home Care Screening Complete  Medication Review (RN CM) Complete  Some recent data might be hidden

## 2020-11-16 DIAGNOSIS — N179 Acute kidney failure, unspecified: Secondary | ICD-10-CM | POA: Diagnosis not present

## 2020-11-16 MED ORDER — MELATONIN 5 MG PO TABS
5.0000 mg | ORAL_TABLET | Freq: Every evening | ORAL | Status: DC | PRN
  Administered 2020-11-16 (×2): 5 mg via ORAL
  Filled 2020-11-16 (×2): qty 1

## 2020-11-16 NOTE — Progress Notes (Signed)
PROGRESS NOTE    Karen Best  [id number] DOB: 25-Apr-1956 DOA: 11/07/2020 PCP: [facility]    Chief Complaint  Patient presents with   Altered Mental Status    Brief Narrative:   History of schizophrenia, CVA, hypertension present with somnolence, acute encephalopathy, seen by psych due to concern for psychosis  Also treated for AKI and UTI Improved, psych signed off on 7/19, awaiting for ALF placement  Subjective:  She is alert , calm , interactive and pleasant, she denies pain, she is agreeing to go to ALF   Assessment & Plan:   Principal Problem:   AKI (acute kidney injury) ([place]) Active Problems:   CVA (cerebral vascular accident) ([place])   Schizoaffective disorder, bipolar type ([place])   Acute encephalopathy   Malnutrition of moderate degree  Acute encephalopathy present on admission/concern for psychosis/schizophrenia -No acute finding on CT head -UDS negative, B12 within normal limit, RPR nonreactive, ammonia was mildly elevated initially on repeat was normal -On Depakote, Zyprexa, valbenazine per psych recommendation , hallucination mania improved per psych, psych signed off on 7/19  Abnormal thyroid function test -On presentation -Thyroid function test normalized on repeat -Follow-up outpatient   Aerococcus UTI -On amoxicillin, finished treatment on 7/21  AKI, on presentation -BUN 40/creatinine 4 on presentation -BUN/creatinine has normalized  Mild hyponatremia -Resolved  History of hypertension Presented with hypotension Home blood pressure medication held on presentation Blood pressure start to trend up, start low-dose Lopressor  Nutritional Assessment: The patient's BMI is: Body mass index is 24.58 kg/m.Marland Kitchen Seen by dietician.  I agree with the assessment and plan as outlined below: Nutrition Status: Nutrition Problem: Moderate Malnutrition Etiology: chronic illness (asthma, schizophrenia, Bipolar  disorder) Signs/Symptoms: mild fat depletion, mild muscle depletion Interventions: Ensure Enlive (each supplement provides 350kcal and 20 grams of protein), MVI, Liberalize Diet  .     Unresulted Labs (From admission, onward)     Start     Ordered   11/08/20 0500  CBC  Daily,   R      11/08/20 0136              DVT prophylaxis: heparin injection 5,000 Units Start: 11/08/20 0600   Code Status: Full Family Communication: Patient Disposition:   Status is: Inpatient   Dispo: The patient is from: From boardinghouse              Anticipated d/c is to: ALF              Anticipated d/c date is: Medically stable to discharge, awaiting for ALF placement                Consultants:  Psychiatry  Procedures:  None  Antimicrobials:   Anti-infectives (From admission, onward)    Start     Dose/Rate Route Frequency Ordered Stop   11/11/20 1630  amoxicillin (AMOXIL) capsule 500 mg        500 mg Oral Every 8 hours 11/11/20 1535 11/16/20 0459           Objective: Vitals:   11/15/20 0827 11/15/20 1150 11/15/20 1942 11/16/20 0500  BP: (!) 134/94 125/80 (!) 143/63 102/72  Pulse:  61 (!) 59 70  Resp: '14 16 15 12  '$ Temp: 97.6 F (36.4 C) (!) 97.5 F (36.4 C) 97.7 F (36.5 C) 98.2 F (36.8 C)  TempSrc: Oral Oral Oral Axillary  SpO2: 97% 96% 96% 97%  Weight:    67 kg  Height:  Intake/Output Summary (Last 24 hours) at 11/16/2020 1102 Last data filed at 11/16/2020 0509 Gross per 24 hour  Intake --  Output 1475 ml  Net -1475 ml   Filed Weights   11/14/20 0521 11/15/20 0536 11/16/20 0500  Weight: 69.4 kg 66.2 kg 67 kg    Examination:  General exam: No agitation, pleasant and interactive this am Respiratory system: Clear to auscultation. Respiratory effort normal. Cardiovascular system: RRR. No pedal edema. Gastrointestinal system: Abdomen is nondistended, soft and nontender. No organomegaly or masses felt. Normal bowel sounds heard. Central nervous  system: alert, oriented x3 Extremities: Generalized weakness Skin: No rashes, lesions or ulcers Psychiatry: alert, pleasant    Data Reviewed: I have personally reviewed following labs and imaging studies  CBC: Recent Labs  Lab 11/10/20 0223 11/11/20 0114 11/12/20 0052 11/14/20 0045  WBC 8.3 6.1 7.4 6.8  NEUTROABS 2.5 2.0 2.6 2.1  HGB 11.9* 12.4 13.6 12.8  HCT 36.9 39.4 43.2 41.9  MCV 72.1* 73.0* 72.7* 73.9*  PLT 203 216 230 0000000    Basic Metabolic Panel: Recent Labs  Lab 11/10/20 0223 11/11/20 0114 11/12/20 0052 11/14/20 0045  NA 137 133* 137 136  K 4.0 3.8 4.5 4.7  CL 110 105 105 105  CO2 21* '23 24 23  '$ GLUCOSE 91 107* 108* 122*  BUN '13 11 16 15  '$ CREATININE 0.70 0.70 0.69 0.76  CALCIUM 9.8 9.7 10.2 10.2  MG 1.7 1.7 1.9  --   PHOS 2.3* 3.0 3.2  --     GFR: Estimated Creatinine Clearance: 63.9 mL/min (by C-G formula based on SCr of 0.76 mg/dL).  Liver Function Tests: Recent Labs  Lab 11/10/20 0223 11/11/20 0114 11/12/20 0052 11/14/20 0045  AST 21 19 38 30  ALT 18 18 33 35  ALKPHOS 60 66 69 61  BILITOT 0.4 0.4 0.4 0.6  PROT 6.6 6.6 7.0 7.2  ALBUMIN 2.8* 2.8* 3.0* 3.0*    CBG: No results for input(s): GLUCAP in the last 168 hours.    Recent Results (from the past 240 hour(s))  Blood culture (routine x 2)     Status: None   Collection Time: 11/07/20 11:55 PM   Specimen: BLOOD RIGHT WRIST  Result Value Ref Range Status   Specimen Description BLOOD RIGHT WRIST  Final   Special Requests   Final    BOTTLES DRAWN AEROBIC AND ANAEROBIC Blood Culture adequate volume   Culture   Final    NO GROWTH 5 DAYS Performed at [facility], [address] [place], [place] [address]    Report Status 11/13/2020 FINAL  Final  SARS CORONAVIRUS 2 (TAT 6-24 HRS) Nasopharyngeal Nasopharyngeal Swab     Status: None   Collection Time: 11/08/20 12:52 AM   Specimen: Nasopharyngeal Swab  Result Value Ref Range Status   SARS Coronavirus 2 NEGATIVE NEGATIVE Final     Comment: (NOTE) SARS-CoV-2 target nucleic acids are NOT DETECTED.  The SARS-CoV-2 RNA is generally detectable in upper and lower respiratory specimens during the acute phase of infection. Negative results do not preclude SARS-CoV-2 infection, do not rule out co-infections with other pathogens, and should not be used as the sole basis for treatment or other patient management decisions. Negative results must be combined with clinical observations, patient history, and epidemiological information. The expected result is Negative.  Fact Sheet for Patients: SugarRoll.be  Fact Sheet for Healthcare Providers: https://www.woods-mathews.com/  This test is not yet approved or cleared by the [place] FDA and  has  been authorized for detection and/or diagnosis of SARS-CoV-2 by FDA under an Emergency Use Authorization (EUA). This EUA will remain  in effect (meaning this test can be used) for the duration of the COVID-19 declaration under Se ction 564(b)(1) of the Act, 21 U.S.C. section 360bbb-3(b)(1), unless the authorization is terminated or revoked sooner.  Performed at [facility], [place] [address] [place], [place] [address]   Urine Culture     Status: Abnormal   Collection Time: 11/08/20  8:45 AM   Specimen: Urine, Clean Catch  Result Value Ref Range Status   Specimen Description URINE, CLEAN CATCH  Final   Special Requests NONE  Final   Culture (A)  Final    >=100,000 COLONIES/mL AEROCOCCUS SPECIES Standardized susceptibility testing for this organism is not available. Performed at [facility], [place] [address] [place], [place] [address]    Report Status 11/09/2020 FINAL  Final         Radiology Studies: No results found.      Scheduled Meds:  divalproex  500 mg Oral Q12H   feeding supplement  237 mL Oral BID BM   heparin  5,000 Units Subcutaneous Q8H   metoprolol tartrate  12.5 mg Oral BID   multivitamin  with minerals  1 tablet Oral Daily   OLANZapine  5 mg Oral BID   tuberculin  5 Units Intradermal Once   valbenazine  40 mg Oral Daily   Continuous Infusions:   LOS: 8 days   Time spent: 50mns Greater than 50% of this time was spent in counseling, explanation of diagnosis, planning of further management, and coordination of care.   Voice Recognition /Viviann Sparedictation system was used to create this note, attempts have been made to correct errors. Please contact the author with questions and/or clarifications.   FFlorencia Reasons MD PhD FACP [facility]  Available via Epic secure chat 7am-7pm for nonurgent issues Please page for urgent issues To page the attending provider between 7A-7P or the covering provider during after hours 7P-7A, please log into the web site [email or web address] and access using universal [place] password for that web site. If you do not have the password, please call the hospital operator.    11/16/2020, 11:02 AM

## 2020-11-16 NOTE — TOC Progression Note (Addendum)
Transition of Care [facility]) - Progression Note    Patient Details  Name: Karen Best MRN: [id number] Date of Birth: 12-26-1955  Transition of Care [facility]) CM/SW [place], [place] Phone Number: 11/16/2020, 1:09 PM  Clinical Narrative:      Update- CSW met with patient at bedside. CSW informed patient that ALF was unable to accept patient for placement. Patient reports she does want to go home. Patient reports that her roommate Rolan Bucco and landord Mr. Clide Dales can help assist with patients needs.  CSW informed CM.   Update- CSW received call from Hydia with [facility] who confirmed with CSW that after her regional manager reviewed patient they are unable to accept patient. Patient is over the income limit to receive special assistance. With patient being over income bracket to receive special assistance, ALF is unable to accept.CSW will inform patient.CSW informed MD,CM, and Therapist, sports.   CSW spoke with Hydia with [employer]. Hydia confirmed she will call CSW back with determination on ALF placement for patient. CSW will continue to follow and assist with dc planning needs.  Expected Discharge Plan: Assisted Living Barriers to Discharge: Continued Medical Work up  Expected Discharge Plan and Services Expected Discharge Plan: Assisted Living In-house Referral: Clinical Social Work     Living arrangements for the past 2 months: Single Family Home (from [facility]                                       Social Determinants of Health ([place]) Interventions    Readmission Risk Interventions Readmission Risk Prevention Plan 09/25/2020  Transportation Screening Complete  PCP or Specialist Appt within 5-7 Days Complete  Home Care Screening Complete  Medication Review (RN CM) Complete  Some recent data might be hidden

## 2020-11-17 DIAGNOSIS — N179 Acute kidney failure, unspecified: Secondary | ICD-10-CM | POA: Diagnosis not present

## 2020-11-17 MED ORDER — OLANZAPINE 5 MG PO TABS: 5.0000 mg | ORAL_TABLET | Freq: Two times a day (BID) | ORAL | 0 refills | Status: DC

## 2020-11-17 MED ORDER — MELATONIN 5 MG PO TABS: 5.0000 mg | ORAL_TABLET | Freq: Every day | ORAL | 0 refills | Status: DC

## 2020-11-17 MED ORDER — DIVALPROEX SODIUM 500 MG PO DR TAB: 500.0000 mg | DELAYED_RELEASE_TABLET | Freq: Two times a day (BID) | ORAL | 0 refills | Status: DC

## 2020-11-17 MED ORDER — VALBENAZINE TOSYLATE 40 MG PO CAPS: 40.0000 mg | ORAL_CAPSULE | Freq: Every day | ORAL | 0 refills | Status: DC

## 2020-11-17 MED ORDER — METOPROLOL TARTRATE 25 MG PO TABS: 12.5000 mg | ORAL_TABLET | Freq: Two times a day (BID) | ORAL | 0 refills | Status: DC

## 2020-11-17 NOTE — Progress Notes (Signed)
Checked Right arm for PPD skin test. Noted area, pink 2cm x3cm and hard when palpated. Checked by Nona Dell RN and agreed positive result. Notified Florencia Reasons MD, she observed result and will follow up with patients friend Fredonia Highland, who brings patient to appointments. Patient needs to follow up with  [facility] [employer].

## 2020-11-17 NOTE — Progress Notes (Addendum)
Received referral to assist pt with [facility] PT, RN. Contacted Jason with [facility] and he declined the referral. Contacted Corey with [facility] for [facility] referral, but he declined it. Informed Dr. Erlinda Hong that I was not able to arranged [facility] due to pt's insurance (Medicaid). PT is recommending a RW. Met with pt. She reports that she is returning home with the support of her friend Fredonia Highland). She reports that she has 2 RW at home. Contacted [name] at [phone], left a message to return CM call.   Left 2 messages for [name] at 11:40 and 13:03. Awaiting for Vicente Serene to return CM call.

## 2020-11-17 NOTE — Discharge Summary (Addendum)
Discharge Summary  Karen Best [id number] DOB: Dec 15, 1955  PCP: [facility]  Admit date: 11/07/2020 Discharge date: 11/17/2020  Time spent: 36mns, more than 50% time spent on coordination of care.  Recommendations for Outpatient Follow-up:  F/u with PCP within a week  for hospital discharge follow up, repeat cbc/bmp at follow up F/ui with [facility] She is declined by ALF, she desires to go home, Home health ordered  Addendum: PPD test placed due to placed ALF placement initially, test read at 48hrs is positive, she does not have respiratory symptom, case discussed with ID on call who recommend patient to follow up with health department for latent TB management.    Discharge Diagnoses:  Active Hospital Problems   Diagnosis Date Noted   AKI (acute kidney injury) ([facility] 10/02/2019   Malnutrition of moderate degree 11/10/2020   Acute encephalopathy 09/18/2020   Schizoaffective disorder, bipolar type ([facility] [place] 05/13/2020   CVA (cerebral vascular accident) ([facility] 10/02/2019    Resolved Hospital Problems  No resolved problems to display.    Discharge Condition: stable  Diet recommendation: heart healthy  Filed Weights   11/15/20 0536 11/16/20 0500 11/17/20 0529  Weight: 66.2 kg 67 kg 66 kg    History of present illness: ( per admitting MD Dr OMyna Hidalgo Patient coming from: Home    Chief Complaint: Altered mental status    HPI: Karen BATHGATEis a 65y.o. female with medical history significant for schizophrenia, history of CVA, hypertension, and tobacco abuse, presenting to the emergency department for evaluation of somnolence and dysarthria.  Patient is unable to contribute to the history and her emergency contacts cannot be reached.  Per report of EMS, the patient was noted by roommates to been yelling all throughout the day and then found slumped over in a chair.  She was found to be somnolent and dysarthric by EMS with blood pressure of 78  systolic.  She was given 2 mg of Narcan with no significant change in her condition reported.   ED Course: Upon arrival to the ED, patient is found to be afebrile, saturating 90s on room air, heart rate in the 60s to [id number] and systolic blood pressure in the low 100s.  EKG features sinus rhythm with LAFB and minimal ST elevation anteriorly.  Chest x-ray negative for acute cardiopulmonary disease.  Head CT with no acute abnormality.  Chemistry panel notable for BUN of 39, sodium 132, bicarbonate 18, and creatinine of 3.76.  CBC with microcytosis without anemia.  Lactic acid 3.5.  Ammonia 64.  Valproic acid level 20.  Patient was given 2 L of saline in the ED.    Hospital Course:  Principal Problem:   AKI (acute kidney injury) ([facility] Active Problems:   CVA (cerebral vascular accident) ([facility]   Schizoaffective disorder, bipolar type ([facility]   Acute encephalopathy   Malnutrition of moderate degree      Acute encephalopathy present on admission/concern for psychosis/schizophrenia -No acute finding on CT head -UDS negative, B12 within normal limit, RPR nonreactive, ammonia was mildly elevated initially on repeat was normal -On Depakote, Zyprexa, valbenazine per psych recommendation , hallucination mania improved per psych, psych signed off on 7/19 -she has much improved, she is aaox3, pleasant and cooperative, she desires to go home , She verified her pharmacy She called her friend Karen Karen Dalesand requested him  to bring her to her pharmacy and then bring her home, patient also requested me to talk to Karen Best  Per my conversation with Karen Best,  he checks on Karen Best daily, he brings patient to [facility] for her medical care, he also bring patient to [facility] for psychiatry care. He is trying to help set up personal care for Karen Best, he requested to talk to care manager/social worker. Case manager/social worker consulted   Abnormal thyroid function test -On  presentation -Thyroid function test normalized on repeat -Follow-up outpatient     Aerococcus UTI -On amoxicillin, finished treatment on 7/21   AKI, on presentation -BUN 40/creatinine 4 on presentation -BUN/creatinine has normalized   Mild hyponatremia -Resolved   History of hypertension, Presented with hypotension Home blood pressure medication held on presentation Blood pressure start to trend up, started back on low-dose Lopressor   Nutritional Assessment: The patient's BMI is: Body mass index is 24.58 kg/m.Marland Kitchen Seen by dietician.  I agree with the assessment and plan as outlined below: Nutrition Status: Nutrition Problem: Moderate Malnutrition Etiology: chronic illness (asthma, schizophrenia, Bipolar disorder) Signs/Symptoms: mild fat depletion, mild muscle depletion Interventions: Ensure Enlive (each supplement provides 350kcal and 20 grams of protein), MVI, Liberalize Diet   .  Consultants:  Psychiatry   Procedures:  None   Antimicrobials:   Anti-infectives (From admission, onward)    Start     Dose/Rate Route Frequency Ordered Stop   11/11/20 1630  amoxicillin (AMOXIL) capsule 500 mg        500 mg Oral Every 8 hours 11/11/20 1535 11/16/20 0459         Discharge Exam: BP 134/87 (BP Location: Right Arm)   Pulse 63   Temp 97.9 F (36.6 C) (Oral)   Resp 16   Ht '5\' 5"'$  (1.651 m)   Wt 66 kg   SpO2 99%   BMI 24.21 kg/m   General: NAD, aaox3, pleasant Cardiovascular: RRR Respiratory: CTABL  Discharge Instructions You were cared for by a hospitalist during your hospital stay. If you have any questions about your discharge medications or the care you received while you were in the hospital after you are discharged, you can call the unit and asked to speak with the hospitalist on call if the hospitalist that took care of you is not available. Once you are discharged, your primary care physician will handle any further medical issues. Please note that NO  REFILLS for any discharge medications will be authorized once you are discharged, as it is imperative that you return to your primary care physician (or establish a relationship with a primary care physician if you do not have one) for your aftercare needs so that they can reassess your need for medications and monitor your lab values.  Discharge Instructions     Diet - low sodium heart healthy   Complete by: As directed    Increase activity slowly   Complete by: As directed       Allergies as of 11/17/2020   No Known Allergies      Medication List     STOP taking these medications    clonazePAM 0.5 MG tablet Commonly known as: KLONOPIN   ibuprofen 600 MG tablet Commonly known as: ADVIL   lisinopril 5 MG tablet Commonly known as: ZESTRIL   QUEtiapine 100 MG tablet Commonly known as: SEROQUEL   traZODone 50 MG tablet Commonly known as: DESYREL       TAKE these medications    divalproex 500 MG DR tablet Commonly known as: DEPAKOTE Take 1 tablet (500 mg total) by mouth every 12 (twelve)  hours.   melatonin 5 MG Tabs Take 1 tablet (5 mg total) by mouth at bedtime.   metoprolol tartrate 25 MG tablet Commonly known as: LOPRESSOR Take 0.5 tablets (12.5 mg total) by mouth 2 (two) times daily.   OLANZapine 5 MG tablet Commonly known as: ZYPREXA Take 1 tablet (5 mg total) by mouth 2 (two) times daily. What changed:  medication strength how much to take when to take this   valbenazine 40 MG capsule Commonly known as: Ingrezza Take 1 capsule (40 mg total) by mouth at bedtime.       No Known [place] Follow up.   Specialty: Pediatrics Contact information: [place] [address] [phone]         [facility] Follow up.   Contact information: [address]  [place] [address] [phone]                  The results of significant  diagnostics from this hospitalization (including imaging, microbiology, ancillary and laboratory) are listed below for reference.    Significant Diagnostic Studies: CT HEAD WO CONTRAST  Result Date: 11/07/2020 CLINICAL DATA:  Altered mental status EXAM: CT HEAD WITHOUT CONTRAST TECHNIQUE: Contiguous axial images were obtained from the base of the skull through the vertex without intravenous contrast. COMPARISON:  09/19/2020 FINDINGS: Brain: No evidence of acute infarction, hemorrhage, hydrocephalus, extra-axial collection or mass lesion/mass effect. Mild chronic white matter ischemic changes are noted. Vascular: No hyperdense vessel or unexpected calcification. Skull: Normal. Negative for fracture or focal lesion. Sinuses/Orbits: No acute finding. Other: None. IMPRESSION: Chronic white matter ischemic changes without acute abnormality. Electronically Signed   By: Inez Catalina M.D.   On: 11/07/2020 23:14   US RENAL  Result Date: 11/08/2020 CLINICAL DATA:  Acute kidney injury. EXAM: RENAL / URINARY TRACT ULTRASOUND COMPLETE COMPARISON:  None. FINDINGS: Right Kidney: Renal measurements: 9.2 x 5.7 x 5.9 cm = volume: 162 mL. Echogenicity within normal limits. No mass or hydronephrosis visualized. Left Kidney: Renal measurements: 11.0 x 5.6 x 6.3 cm = volume: 201 mL. Echogenicity within normal limits. No mass or hydronephrosis visualized. Bladder: Appears normal for degree of bladder distention. Ureteral jets could not be demonstrated on color Doppler evaluation of the bladder. Other: Incidental note of cholelithiasis. IMPRESSION: No evidence for hydronephrosis. Cholelithiasis. Electronically Signed   By: Misty Stanley M.D.   On: 11/08/2020 14:14   DG Chest Portable 1 View  Result Date: 11/07/2020 CLINICAL DATA:  Altered mental status EXAM: PORTABLE CHEST 1 VIEW COMPARISON:  10/01/2019 FINDINGS: Cardiac shadow is stable. No focal infiltrate or effusion is seen. No bony abnormality is noted. Mild interstitial  scarring is again seen and stable. No other focal abnormality is seen. IMPRESSION: No active disease. Electronically Signed   By: Inez Catalina M.D.   On: 11/07/2020 22:25    Microbiology: Recent Results (from the past 240 hour(s))  Blood culture (routine x 2)     Status: None   Collection Time: 11/07/20 11:55 PM   Specimen: BLOOD RIGHT WRIST  Result Value Ref Range Status   Specimen Description BLOOD RIGHT WRIST  Final   Special Requests   Final    BOTTLES DRAWN AEROBIC AND ANAEROBIC Blood Culture adequate volume   Culture   Final    NO GROWTH 5 DAYS Performed at [facility], [address], [place], [place] [address]    Report  Status 11/13/2020 FINAL  Final  SARS CORONAVIRUS 2 (TAT 6-24 HRS) Nasopharyngeal Nasopharyngeal Swab     Status: None   Collection Time: 11/08/20 12:52 AM   Specimen: Nasopharyngeal Swab  Result Value Ref Range Status   SARS Coronavirus 2 NEGATIVE NEGATIVE Final    Comment: (NOTE) SARS-CoV-2 target nucleic acids are NOT DETECTED.  The SARS-CoV-2 RNA is generally detectable in upper and lower respiratory specimens during the acute phase of infection. Negative results do not preclude SARS-CoV-2 infection, do not rule out co-infections with other pathogens, and should not be used as the sole basis for treatment or other patient management decisions. Negative results must be combined with clinical observations, patient history, and epidemiological information. The expected result is Negative.  Fact Sheet for Patients: SugarRoll.be  Fact Sheet for Healthcare Providers: https://www.woods-mathews.com/  This test is not yet approved or cleared by the [place] FDA and  has been authorized for detection and/or diagnosis of SARS-CoV-2 by FDA under an Emergency Use Authorization (EUA). This EUA will remain  in effect (meaning this test can be used) for the duration of the COVID-19 declaration under Se ction  564(b)(1) of the Act, 21 U.S.C. section 360bbb-3(b)(1), unless the authorization is terminated or revoked sooner.  Performed at [facility], [address], [place], [place] [address]   Urine Culture     Status: Abnormal   Collection Time: 11/08/20  8:45 AM   Specimen: Urine, Clean Catch  Result Value Ref Range Status   Specimen Description URINE, CLEAN CATCH  Final   Special Requests NONE  Final   Culture (A)  Final    >=100,000 COLONIES/mL AEROCOCCUS SPECIES Standardized susceptibility testing for this organism is not available. Performed at [facility], [place] [address] [place], [place] [address]    Report Status 11/09/2020 FINAL  Final     Labs: Basic Metabolic Panel: Recent Labs  Lab 11/11/20 0114 11/12/20 0052 11/14/20 0045  NA 133* 137 136  K 3.8 4.5 4.7  CL 105 105 105  CO2 '23 24 23  '$ GLUCOSE 107* 108* 122*  BUN '11 16 15  '$ CREATININE 0.70 0.69 0.76  CALCIUM 9.7 10.2 10.2  MG 1.7 1.9  --   PHOS 3.0 3.2  --    Liver Function Tests: Recent Labs  Lab 11/11/20 0114 11/12/20 0052 11/14/20 0045  AST 19 38 30  ALT 18 33 35  ALKPHOS 66 69 61  BILITOT 0.4 0.4 0.6  PROT 6.6 7.0 7.2  ALBUMIN 2.8* 3.0* 3.0*   No results for input(s): LIPASE, AMYLASE in the last 168 hours. No results for input(s): AMMONIA in the last 168 hours. CBC: Recent Labs  Lab 11/11/20 0114 11/12/20 0052 11/14/20 0045  WBC 6.1 7.4 6.8  NEUTROABS 2.0 2.6 2.1  HGB 12.4 13.6 12.8  HCT 39.4 43.2 41.9  MCV 73.0* 72.7* 73.9*  PLT 216 230 249   Cardiac Enzymes: No results for input(s): CKTOTAL, CKMB, CKMBINDEX, TROPONINI in the last 168 hours. BNP: BNP (last 3 results) No results for input(s): BNP in the last 8760 hours.  ProBNP (last 3 results) No results for input(s): PROBNP in the last 8760 hours.  CBG: No results for input(s): GLUCAP in the last 168 hours.     Signed:  Florencia Reasons MD, PhD, FACP  [facility] 11/17/2020, 11:06 AM

## 2020-11-18 NOTE — TOC Progression Note (Signed)
Transition of Care [facility]) - Progression Note    Patient Details  Name: Karen Best MRN: [id number] Date of Birth: 22-Aug-1955  [facility] of [facility] CM/SW Contact  Zenon Mayo, RN Phone Number: 11/18/2020, 4:26 PM  Clinical Narrative:    NCM received information from [facility] that someone called her and states this patient needs HHPT set up because it did not get done on Saturday.  This NCM working on Sunday, tried to call patient back at home phone and it states this is not a working number, NCM called the cell phone number and it starts vm has not been set up yet and then disconnected.  This NCM could not get in contact with patient.   Expected Discharge Plan: Assisted Living Barriers to Discharge: Continued Medical Work up  Expected Discharge Plan and Services Expected Discharge Plan: Assisted Living In-house Referral: Clinical Social Work     Living arrangements for the past 2 months: Single Family Home (from [facility]) Expected Discharge Date: 11/17/20                                     Social Determinants of Health (SDOH) Interventions    Readmission Risk Interventions Readmission Risk Prevention Plan 09/25/2020  Transportation Screening Complete  PCP or Specialist Appt within 5-7 Days Complete  Home Care Screening Complete  Medication Review (RN CM) Complete  Some recent data might be hidden

## 2020-12-02 ENCOUNTER — Emergency Department (HOSPITAL_COMMUNITY)
Admission: EM | Admit: 2020-12-02 | Discharge: 2020-12-02 | Disposition: A | Discharge status: Home / Self Care | Payer: Medicaid Other | Attending: Emergency Medicine | Admitting: Emergency Medicine

## 2020-12-02 ENCOUNTER — Other Ambulatory Visit: Payer: Self-pay

## 2020-12-02 ENCOUNTER — Encounter (HOSPITAL_COMMUNITY): Payer: Self-pay

## 2020-12-02 DIAGNOSIS — N390 Urinary tract infection, site not specified: Secondary | ICD-10-CM | POA: Diagnosis not present

## 2020-12-02 DIAGNOSIS — N3000 Acute cystitis without hematuria: Secondary | ICD-10-CM

## 2020-12-02 DIAGNOSIS — I10 Essential (primary) hypertension: Secondary | ICD-10-CM | POA: Insufficient documentation

## 2020-12-02 DIAGNOSIS — F17210 Nicotine dependence, cigarettes, uncomplicated: Secondary | ICD-10-CM | POA: Insufficient documentation

## 2020-12-02 DIAGNOSIS — J45909 Unspecified asthma, uncomplicated: Secondary | ICD-10-CM | POA: Insufficient documentation

## 2020-12-02 DIAGNOSIS — R300 Dysuria: Secondary | ICD-10-CM | POA: Diagnosis present

## 2020-12-02 DIAGNOSIS — Z79899 Other long term (current) drug therapy: Secondary | ICD-10-CM | POA: Insufficient documentation

## 2020-12-02 LAB — URINALYSIS, ROUTINE W REFLEX MICROSCOPIC
Bilirubin Urine: NEGATIVE
Glucose, UA: NEGATIVE mg/dL
Ketones, ur: NEGATIVE mg/dL
Nitrite: NEGATIVE
Protein, ur: 100 mg/dL — AB
RBC / HPF: >50 RBC/hpf — ABNORMAL HIGH (ref 0–5)
Specific Gravity, Urine: 1.011 (ref 1.005–1.030)
WBC, UA: >50 WBC/hpf — ABNORMAL HIGH (ref 0–5)
pH: 5 (ref 5.0–8.0)

## 2020-12-02 MED ORDER — CEFTRIAXONE SODIUM 1 G IJ SOLR
1.0000 g | Freq: Once | INTRAMUSCULAR | Status: AC
  Administered 2020-12-02: 1 g via INTRAMUSCULAR
  Filled 2020-12-02: qty 10

## 2020-12-02 MED ORDER — CEPHALEXIN 500 MG PO CAPS: ORAL_CAPSULE | ORAL | 0 refills | Status: DC

## 2020-12-02 MED ORDER — LIDOCAINE HCL 1 % IJ SOLN
INTRAMUSCULAR | Status: AC
  Filled 2020-12-02: qty 20

## 2020-12-02 NOTE — Discharge Instructions (Addendum)
You appear to have a urinary tract infection as the cause of your burning. You have been given a shot of antibiotics tonight but will need to take an antibiotic by mouth. Please follow up with your primary care doctor.    SEEK IMMEDIATE MEDICAL CARE IF:  You have severe back pain or lower abdominal pain.  You develop chills.  You have nausea or vomiting.  You have continued burning or discomfort with urination.

## 2020-12-02 NOTE — ED Triage Notes (Signed)
Per patient burning with urination since upon waking today.

## 2020-12-02 NOTE — ED Provider Notes (Signed)
[facility] DEPT Provider Note   CSN: [id number] Arrival date & time: 12/02/20  1807     History Chief Complaint  Patient presents with   burning with urination    Karen Best is a 65 y.o. female.  The history is provided by the patient and medical records.  Dysuria Pain quality:  Burning Pain severity:  Moderate Onset quality:  Sudden Duration:  12 hours Timing:  Constant Progression:  Unchanged Chronicity:  Recurrent Recent urinary tract infections: no   Relieved by:  Nothing Worsened by:  Nothing Ineffective treatments:  None tried Urinary symptoms: frequent urination and incontinence   Urinary symptoms: no discolored urine, no foul-smelling urine, no hematuria and no hesitancy   Associated symptoms: no abdominal pain, no fever, no flank pain, no genital lesions, no nausea, no vaginal discharge and no vomiting   Risk factors: no hx of pyelonephritis, no hx of urolithiasis, no kidney transplant, not pregnant, no recurrent urinary tract infections, not sexually active and no sexually transmitted infections       Past Medical History:  Diagnosis Date   Asthma    Bipolar 1 disorder ([place])    Fibromyalgia    Hypertension    Schizophrenia ([place])    Tobacco abuse     Patient Active Problem List   Diagnosis Date Noted   Malnutrition of moderate degree 11/10/2020   Hypertension    Hyponatremia    Acute encephalopathy 09/18/2020   Schizoaffective disorder, bipolar type ([place]) 05/13/2020   Bacteremia due to Staphylococcus aureus 10/03/2019   Dehydration with hypernatremia 10/03/2019   AKI (acute kidney injury) ([place]) 10/02/2019   Transaminitis 10/02/2019   CVA (cerebral vascular accident) ([place]) 10/02/2019   AMS (altered mental status) 10/01/2019   Lithium toxicity 02/16/2019   Hyperkalemia 02/16/2019   Acute confusion 99991111   Toxic metabolic encephalopathy [id number]   Dysarthria 12/23/2018   Elevated blood pressure reading  12/23/2018   Asthma 12/23/2018   Delirium due to another medical condition    Acute UTI (urinary tract infection) [id number]   Acute metabolic encephalopathy [id number]   Positive hepatitis C antibody test 06/11/2018    Past Surgical History:  Procedure Laterality Date   CESAREAN SECTION     TUBAL LIGATION       OB History   No obstetric history on file.     Family History  Problem Relation Age of Onset   Liver cancer Neg Hx    Liver disease Neg Hx     Social History   Tobacco Use   Smoking status: Every Day    Packs/day: 0.50    Types: Cigarettes   Smokeless tobacco: Never  Vaping Use   Vaping Use: Never used  Substance Use Topics   Alcohol use: Yes    Comment: occ   Drug use: Yes    Types: Cocaine    Comment: crack cocaine (last use 10/25/2018)    Home Medications Prior to Admission medications   Medication Sig Start Date End Date Taking? Authorizing Provider  divalproex (DEPAKOTE) 500 MG DR tablet Take 1 tablet (500 mg total) by mouth every 12 (twelve) hours. 11/17/20   Florencia Reasons, MD  melatonin 5 MG TABS Take 1 tablet (5 mg total) by mouth at bedtime. 11/17/20   Florencia Reasons, MD  metoprolol tartrate (LOPRESSOR) 25 MG tablet Take 0.5 tablets (12.5 mg total) by mouth 2 (two) times daily. 11/17/20   Florencia Reasons, MD  OLANZapine (ZYPREXA) 5 MG tablet Take 1 tablet (5  mg total) by mouth 2 (two) times daily. 11/17/20   Florencia Reasons, MD  valbenazine [facility]) 40 MG capsule Take 1 capsule (40 mg total) by mouth at bedtime. 11/17/20   Florencia Reasons, MD    Allergies    Patient has no known allergies.  Review of Systems   Review of Systems  Constitutional:  Negative for chills and fever.  HENT: Negative.    Eyes: Negative.   Respiratory: Negative.    Gastrointestinal:  Negative for abdominal pain, nausea and vomiting.  Genitourinary:  Positive for dysuria. Negative for flank pain and vaginal discharge.  Skin: Negative.   All other systems reviewed and are negative.  Physical  Exam Updated Vital Signs BP (!) 164/110 (BP Location: Right Arm)   Pulse (!) 113   Temp 99.3 F (37.4 C) (Oral)   Resp 17   Ht '5\' 5"'$  (1.651 m)   Wt 66 kg   SpO2 96%   BMI 24.21 kg/m   Physical Exam Vitals and nursing note reviewed.  Constitutional:      General: She is not in acute distress.    Appearance: She is well-developed. She is not diaphoretic.  HENT:     Head: Normocephalic and atraumatic.     Right Ear: External ear normal.     Left Ear: External ear normal.     Nose: Nose normal.     Mouth/Throat:     Mouth: Mucous membranes are moist.  Eyes:     General: No scleral icterus.    Conjunctiva/sclera: Conjunctivae normal.  Cardiovascular:     Rate and Rhythm: Normal rate and regular rhythm.     Heart sounds: Normal heart sounds. No murmur heard.   No friction rub. No gallop.  Pulmonary:     Effort: Pulmonary effort is normal. No respiratory distress.     Breath sounds: Normal breath sounds.  Abdominal:     General: Bowel sounds are normal. There is no distension.     Palpations: Abdomen is soft. There is no mass.     Tenderness: There is no abdominal tenderness. There is no right CVA tenderness, left CVA tenderness or guarding.  Musculoskeletal:     Cervical back: Normal range of motion.  Skin:    General: Skin is warm and dry.  Neurological:     Mental Status: She is alert and oriented to person, place, and time.  Psychiatric:        Behavior: Behavior normal.    ED Results / Procedures / Treatments   Labs (all labs ordered are listed, but only abnormal results are displayed) Labs Reviewed  URINE CULTURE  URINALYSIS, ROUTINE W REFLEX MICROSCOPIC    EKG None  Radiology No results found.  Procedures Procedures   Medications Ordered in ED Medications - No data to display  ED Course  I have reviewed the triage vital signs and the nursing notes.  Pertinent labs & imaging results that were available during my care of the patient were reviewed  by me and considered in my medical decision making (see chart for details).    MDM Rules/Calculators/A&P                          Pt UA appears infected- Culture sent.  Pt has been diagnosed with a UTI. Pt is afebrile, no CVA tenderness, normotensive, and denies N/V. Pt to be dc home with antibiotics and instructions to follow up with PCP if symptoms persist.  Final  Clinical Impression(s) / ED Diagnoses Final diagnoses:  None    Rx / DC Orders ED Discharge Orders     None        Margarita Mail, PA-C 12/02/20 2116    Davonna Belling, MD 12/02/20 2342

## 2020-12-05 LAB — URINE CULTURE: Culture: 60000 — AB

## 2020-12-06 ENCOUNTER — Telehealth: Payer: Self-pay

## 2020-12-06 NOTE — Telephone Encounter (Signed)
Post ED Visit - Positive Culture Follow-up  Culture report reviewed by antimicrobial stewardship pharmacist: [place] Team '[]'$  Elenor Quinones, Pharm.D. '[]'$  Heide Guile, Pharm.D., BCPS AQ-ID '[]'$  Parks Neptune, Pharm.D., BCPS '[]'$  Alycia Rossetti, Pharm.D., BCPS '[]'$  [place], Pharm.D., BCPS, AAHIVP '[]'$  Legrand Como, Pharm.D., BCPS, AAHIVP '[]'$  Salome Arnt, PharmD, BCPS '[]'$  Johnnette Gourd, PharmD, BCPS '[]'$  Hughes Better, PharmD, BCPS '[]'$  Leeroy Cha, PharmD '[]'$  Laqueta Linden, PharmD, BCPS '[]'$  Albertina Parr, PharmD  [place] Team '[x]'$  Porfirio Oar, PharmD '[]'$  Lindell Spar, PharmD '[]'$  Royetta Asal, PharmD '[]'$  Graylin Shiver, Rph '[]'$  Rema Fendt) Glennon Mac, PharmD '[]'$  Arlyn Dunning, PharmD '[]'$  Netta Cedars, PharmD '[]'$  Dia Sitter, PharmD '[]'$  Leone Haven, PharmD '[]'$  Gretta Arab, PharmD '[]'$  Theodis Shove, PharmD '[]'$  Peggyann Juba, PharmD '[]'$  Reuel Boom, PharmD   Positive urine culture Treated with Cephalexin , organism sensitive to the same and no further patient follow-up is required at this time.  Glennon Hamilton 12/06/2020, 9:17 AM

## 2021-01-02 ENCOUNTER — Encounter (HOSPITAL_COMMUNITY): Payer: Self-pay

## 2021-01-02 ENCOUNTER — Other Ambulatory Visit: Payer: Self-pay

## 2021-01-02 ENCOUNTER — Emergency Department (HOSPITAL_COMMUNITY)
Admission: EM | Admit: 2021-01-02 | Discharge: 2021-01-02 | Disposition: A | Discharge status: Home / Self Care | Payer: Medicaid Other | Attending: Emergency Medicine | Admitting: Emergency Medicine

## 2021-01-02 ENCOUNTER — Encounter: Payer: Self-pay | Admitting: *Deleted

## 2021-01-02 ENCOUNTER — Telehealth: Payer: Self-pay | Admitting: *Deleted

## 2021-01-02 DIAGNOSIS — Z79899 Other long term (current) drug therapy: Secondary | ICD-10-CM | POA: Insufficient documentation

## 2021-01-02 DIAGNOSIS — J45909 Unspecified asthma, uncomplicated: Secondary | ICD-10-CM | POA: Insufficient documentation

## 2021-01-02 DIAGNOSIS — I10 Essential (primary) hypertension: Secondary | ICD-10-CM | POA: Insufficient documentation

## 2021-01-02 DIAGNOSIS — F17210 Nicotine dependence, cigarettes, uncomplicated: Secondary | ICD-10-CM | POA: Diagnosis not present

## 2021-01-02 NOTE — ED Triage Notes (Signed)
Pt BIB EMS from the [facility]. Pt was sent by a nurse there due to high BP. Pt has hx of HTN and schizophrenia and is unable to get her medications.    178/100 HR 100 98% RA

## 2021-01-02 NOTE — ED Provider Notes (Signed)
Karen Best Provider Note   CSN: [id number] Arrival date & time: 01/02/21  1321     History Chief Complaint  Patient presents with   Hypertension    Karen Best is a 65 y.o. female with a history of bipolar 1 disorder, schizophrenia, hypertension.  Presents to the emergency department with a chief complaint of hypertension.  Patient reports that she was at [facility] and was sent to the emerge apartment due to high blood pressure.  Patient reports that she has been noncompliant with medications for 5 years due to trouble paying for medication.  Patient denies any chest pain, shortness of breath, headache, visual disturbance, numbness, weakness, facial asymmetry, abdominal pain, nausea, vomiting, suicidal ideation, homicidal ideation, AVH.     Hypertension Pertinent negatives include no chest pain, no abdominal pain, no headaches and no shortness of breath.      Past Medical History:  Diagnosis Date   Asthma    Bipolar 1 disorder ([place])    Fibromyalgia    Hypertension    Schizophrenia ([place])    Tobacco abuse     Patient Active Problem List   Diagnosis Date Noted   Malnutrition of moderate degree 11/10/2020   Hypertension    Hyponatremia    Acute encephalopathy 09/18/2020   Schizoaffective disorder, bipolar type ([place]) 05/13/2020   Bacteremia due to Staphylococcus aureus 10/03/2019   Dehydration with hypernatremia 10/03/2019   AKI (acute kidney injury) ([place]) 10/02/2019   Transaminitis 10/02/2019   CVA (cerebral vascular accident) ([place]) 10/02/2019   AMS (altered mental status) 10/01/2019   Lithium toxicity 02/16/2019   Hyperkalemia 02/16/2019   Acute confusion 99991111   Toxic metabolic encephalopathy [id number]   Dysarthria 12/23/2018   Elevated blood pressure reading 12/23/2018   Asthma 12/23/2018   Delirium due to another medical condition    Acute UTI (urinary tract infection) 123XX123   Acute metabolic encephalopathy  123XX123   Positive hepatitis C antibody test 06/11/2018    Past Surgical History:  Procedure Laterality Date   CESAREAN SECTION     TUBAL LIGATION       OB History   No obstetric history on file.     Family History  Problem Relation Age of Onset   Liver cancer Neg Hx    Liver disease Neg Hx     Social History   Tobacco Use   Smoking status: Every Day    Packs/day: 0.50    Types: Cigarettes   Smokeless tobacco: Never  Vaping Use   Vaping Use: Never used  Substance Use Topics   Alcohol use: Yes    Comment: occ   Drug use: Yes    Types: Cocaine    Comment: crack cocaine (last use 10/25/2018)    Home Medications Prior to Admission medications   Medication Sig Start Date End Date Taking? Authorizing Provider  cephALEXin (KEFLEX) 500 MG capsule 2 caps po bid x 7 days 12/02/20   Margarita Mail, PA-C  divalproex (DEPAKOTE) 500 MG DR tablet Take 1 tablet (500 mg total) by mouth every 12 (twelve) hours. 11/17/20   Florencia Reasons, MD  melatonin 5 MG TABS Take 1 tablet (5 mg total) by mouth at bedtime. 11/17/20   Florencia Reasons, MD  metoprolol tartrate (LOPRESSOR) 25 MG tablet Take 0.5 tablets (12.5 mg total) by mouth 2 (two) times daily. 11/17/20   Florencia Reasons, MD  OLANZapine (ZYPREXA) 5 MG tablet Take 1 tablet (5 mg total) by mouth 2 (two) times daily. 11/17/20  Florencia Reasons, MD  valbenazine [facility]) 40 MG capsule Take 1 capsule (40 mg total) by mouth at bedtime. 11/17/20   Florencia Reasons, MD    Allergies    Patient has no known allergies.  Review of Systems   Review of Systems  Constitutional:  Negative for chills and fever.  Eyes:  Negative for visual disturbance.  Respiratory:  Negative for shortness of breath.   Cardiovascular:  Negative for chest pain.  Gastrointestinal:  Negative for abdominal pain, nausea and vomiting.  Genitourinary:  Negative for difficulty urinating.  Musculoskeletal:  Negative for back pain and neck pain.  Skin:  Negative for color change and rash.  Neurological:   Negative for dizziness, tremors, seizures, syncope, facial asymmetry, speech difficulty, weakness, light-headedness, numbness and headaches.  Psychiatric/Behavioral:  Negative for confusion, hallucinations and suicidal ideas.    Physical Exam Updated Vital Signs BP 115/90 (BP Location: Right Arm)   Pulse 96   Temp 98.8 F (37.1 C) (Oral)   Resp 18   SpO2 100%   Physical Exam Vitals and nursing note reviewed.  Constitutional:      General: She is not in acute distress.    Appearance: She is not ill-appearing, toxic-appearing or diaphoretic.  HENT:     Head: Normocephalic and atraumatic.  Eyes:     General: Vision grossly intact. No scleral icterus.       Right eye: No discharge.        Left eye: No discharge.     Extraocular Movements: Extraocular movements intact.     Conjunctiva/sclera: Conjunctivae normal.     Pupils: Pupils are equal, round, and reactive to light.  Cardiovascular:     Rate and Rhythm: Normal rate.  Pulmonary:     Effort: Pulmonary effort is normal. No tachypnea, bradypnea or respiratory distress.     Breath sounds: Normal breath sounds. No stridor.  Abdominal:     General: There is no distension. There are no signs of injury.     Palpations: Abdomen is soft. There is no mass or pulsatile mass.     Tenderness: There is no abdominal tenderness. There is no guarding or rebound.  Musculoskeletal:     Cervical back: Normal range of motion and neck supple. No rigidity.  Skin:    General: Skin is warm and dry.  Neurological:     General: No focal deficit present.     Mental Status: She is alert.     GCS: GCS eye subscore is 4. GCS verbal subscore is 5. GCS motor subscore is 6.     Cranial Nerves: No cranial nerve deficit or facial asymmetry.     Sensory: Sensation is intact.     Motor: No weakness, tremor or seizure activity.     Coordination: Romberg sign negative. Finger-Nose-Finger Test normal.     Gait: Gait is intact. Gait normal.     Comments: CN  II-XII intact, equal grip strength, +5 strength to bilateral upper and lower extremities, sensation to light touch intact to bilateral upper and lower extremities  Psychiatric:        Attention and Perception: She is attentive. She does not perceive auditory or visual hallucinations.        Speech: Speech is tangential.        Behavior: Behavior is cooperative.        Thought Content: Thought content is not paranoid. Thought content does not include homicidal or suicidal ideation.    ED Results / Procedures / Treatments  Labs (all labs ordered are listed, but only abnormal results are displayed) Labs Reviewed - No data to display  EKG None  Radiology No results found.  Procedures Procedures   Medications Ordered in ED Medications - No data to display  ED Course  I have reviewed the triage vital signs and the nursing notes.  Pertinent labs & imaging results that were available during my care of the patient were reviewed by me and considered in my medical decision making (see chart for details).    MDM Rules/Calculators/A&P                           Alert 65 year old female in no acute distress, nontoxic appearing.  Presents to emergency department with chief complaint of hypertension.  Patient was sent from [facility] due to reports of high blood pressure.  Patient reports noncompliance with medication x5 years.  Patient blood pressure noted to be 147/84 in emergency department.  Patient has no complaints of chest pain, visual disturbance, headache, nausea, vomiting, abdominal pain, numbness, weakness.  Neurologic exam is reassuring.  Will place consult for social work to assist with medications.  Patient to follow-up with primary care provider or [facility].  Discussed results, findings, treatment and follow up. Patient advised of return precautions. Patient verbalized understanding and agreed with plan.  Patient care and treatment discussed with  attending physician Dr. Vallery Ridge.  Final Clinical Impression(s) / ED Diagnoses Final diagnoses:  Hypertension, unspecified type    Rx / DC Orders ED Discharge Orders     None        Dyann Ruddle 01/02/21 1532    Charlesetta Shanks, MD 01/03/21 1614

## 2021-01-02 NOTE — Congregational Nurse Program (Signed)
  Dept: [place] Nurse Program Note  Date of Encounter: 01/02/2021  Past Medical History: Past Medical History:  Diagnosis Date   Asthma    Bipolar 1 disorder ([place])    Fibromyalgia    Hypertension    Schizophrenia ([place])    Tobacco abuse     Encounter Details:  CNP Questionnaire - 01/02/21 1300       Questionnaire   Do you give verbal consent to treat you today? Yes    Visit Setting Church or Organization    Location Patient Served At [facility]    Patient Status Homeless    Medical Provider Yes    Insurance Medicaid    Intervention Support;Refer    Housing/Utilities No permanent housing    Transportation Provided transportation assistance ([facility] transp,bus pass, taxi voucher, etc.)    Interpersonal Safety Do not feel physically and emotionally safe where you currently live    Food Have food insecurities    Medication Have medication insecurities    Referrals Emergency Department            Client seen at [facility]. She reports she slept outside lastnight near the police station. Client is elderly using a walker. She reports she was kicked out of her apartment and she had paid the $700 rent. Checked client's blood pressure as she has a hx of HTN and says she has not had any medication in over a month 180/120. Attempted to call client's PCP and was on hold 28 minutes then hung up. Called 911 for evaluation. Client has been observed responding to internal stimuli as evidenced by having a conversation with herself. APS had been reported by another earlier today and came to [facility] after client left for the hospital.  Ejay Lashley W RN CN [phone]

## 2021-01-02 NOTE — Telephone Encounter (Signed)
Called client's PCP about elevated blood pressure and was on hold 28 minutes. Hung up and called 911 for assistance.

## 2021-01-02 NOTE — ED Notes (Signed)
Offered bus pass patient refused. Patient requested social service number, number provided

## 2021-01-02 NOTE — ED Provider Notes (Signed)
Emergency Medicine Provider Triage Evaluation Note  Karen Best , a 65 y.o. female  was evaluated in triage.  Pt complains of hypertension.  Patient was sent from [facility] due to hypertension.  Patient reports that she has not had any medications in the last 5 months due to cost of medication.  Patient denies any chest pain, visual disturbance, shortness of breath, headache.  During exam patient has tangential speech.  Is talking about creatures.  Patient has a history of schizophrenia.  Review of Systems  Positive: Hypertension Negative: Chest pain, visual disturbance, shortness of breath, headache  Physical Exam  BP 115/90 (BP Location: Right Arm)   Pulse 96   Temp 98.8 F (37.1 C) (Oral)   Resp 18   SpO2 100%  Gen:   Awake, no distress   Resp:  Normal effort  MSK:   Moves extremities without difficulty  Other:    Medical Decision Making  Medically screening exam initiated at 1:32 PM.  Appropriate orders placed.  DASANI JARES was informed that the remainder of the evaluation will be completed by another provider, this initial triage assessment does not replace that evaluation, and the importance of remaining in the ED until their evaluation is complete.  The patient appears stable so that the remainder of the work up may be completed by another provider.      Loni Beckwith, PA-C 01/02/21 1333    Charlesetta Shanks, MD 01/03/21 [phone]

## 2021-01-02 NOTE — Discharge Instructions (Signed)
You came to the emergency department today to be evaluated for your high blood pressure.  Your blood pressure was slightly elevated.  Your physical exam was reassuring you are not having any concerning symptoms regarding your blood pressure.  I have placed a consult to have the social work team reach out to you regarding medication management.  Please follow-up with [facility].  Get help right away if you: Develop a severe headache or confusion. Have unusual weakness or numbness. Feel faint. Have severe pain in your chest or abdomen. Vomit repeatedly. Have trouble breathing.

## 2021-01-13 ENCOUNTER — Emergency Department (HOSPITAL_COMMUNITY)
Admission: EM | Admit: 2021-01-13 | Discharge: 2021-01-15 | Disposition: A | Discharge status: Home / Self Care | Payer: Medicaid Other | Attending: Emergency Medicine | Admitting: Emergency Medicine

## 2021-01-13 DIAGNOSIS — B182 Chronic viral hepatitis C: Secondary | ICD-10-CM | POA: Diagnosis present

## 2021-01-13 DIAGNOSIS — G928 Other toxic encephalopathy: Secondary | ICD-10-CM | POA: Diagnosis not present

## 2021-01-13 DIAGNOSIS — N390 Urinary tract infection, site not specified: Secondary | ICD-10-CM | POA: Insufficient documentation

## 2021-01-13 DIAGNOSIS — Z046 Encounter for general psychiatric examination, requested by authority: Secondary | ICD-10-CM | POA: Diagnosis not present

## 2021-01-13 DIAGNOSIS — Z79899 Other long term (current) drug therapy: Secondary | ICD-10-CM | POA: Insufficient documentation

## 2021-01-13 DIAGNOSIS — F17210 Nicotine dependence, cigarettes, uncomplicated: Secondary | ICD-10-CM | POA: Insufficient documentation

## 2021-01-13 DIAGNOSIS — F309 Manic episode, unspecified: Secondary | ICD-10-CM | POA: Insufficient documentation

## 2021-01-13 DIAGNOSIS — R7309 Other abnormal glucose: Secondary | ICD-10-CM | POA: Diagnosis not present

## 2021-01-13 DIAGNOSIS — J45909 Unspecified asthma, uncomplicated: Secondary | ICD-10-CM | POA: Diagnosis not present

## 2021-01-13 DIAGNOSIS — R768 Other specified abnormal immunological findings in serum: Secondary | ICD-10-CM | POA: Diagnosis not present

## 2021-01-13 DIAGNOSIS — F99 Mental disorder, not otherwise specified: Secondary | ICD-10-CM | POA: Insufficient documentation

## 2021-01-13 DIAGNOSIS — F250 Schizoaffective disorder, bipolar type: Secondary | ICD-10-CM | POA: Diagnosis not present

## 2021-01-13 DIAGNOSIS — Z20822 Contact with and (suspected) exposure to covid-19: Secondary | ICD-10-CM | POA: Diagnosis not present

## 2021-01-13 DIAGNOSIS — I10 Essential (primary) hypertension: Secondary | ICD-10-CM | POA: Insufficient documentation

## 2021-01-13 LAB — CBC
HCT: 43.4 % (ref 36.0–46.0)
Hemoglobin: 13.5 g/dL (ref 12.0–15.0)
MCH: 23.6 pg — ABNORMAL LOW (ref 26.0–34.0)
MCHC: 31.1 g/dL (ref 30.0–36.0)
MCV: 75.7 fL — ABNORMAL LOW (ref 80.0–100.0)
Platelets: 328 10*3/uL (ref 150–400)
RBC: 5.73 MIL/uL — ABNORMAL HIGH (ref 3.87–5.11)
RDW: 15.9 % — ABNORMAL HIGH (ref 11.5–15.5)
WBC: 8.8 10*3/uL (ref 4.0–10.5)
nRBC: 0 % (ref 0.0–0.2)

## 2021-01-13 LAB — RESP PANEL BY RT-PCR (FLU A&B, COVID) ARPGX2
Influenza A by PCR: NEGATIVE
Influenza B by PCR: NEGATIVE
SARS Coronavirus 2 by RT PCR: NEGATIVE

## 2021-01-13 LAB — COMPREHENSIVE METABOLIC PANEL
ALT: 20 U/L (ref 0–44)
AST: 29 U/L (ref 15–41)
Albumin: 4.3 g/dL (ref 3.5–5.0)
Alkaline Phosphatase: 72 U/L (ref 38–126)
Anion gap: 12 (ref 5–15)
BUN: 8 mg/dL (ref 8–23)
CO2: 23 mmol/L (ref 22–32)
Calcium: 10.9 mg/dL — ABNORMAL HIGH (ref 8.9–10.3)
Chloride: 111 mmol/L (ref 98–111)
Creatinine, Ser: 0.73 mg/dL (ref 0.44–1.00)
GFR, Estimated: >60 mL/min (ref >60)
Glucose, Bld: 136 mg/dL — ABNORMAL HIGH (ref 70–99)
Potassium: 3.7 mmol/L (ref 3.5–5.1)
Sodium: 146 mmol/L — ABNORMAL HIGH (ref 135–145)
Total Bilirubin: 0.7 mg/dL (ref 0.3–1.2)
Total Protein: 9.1 g/dL — ABNORMAL HIGH (ref 6.5–8.1)

## 2021-01-13 LAB — ACETAMINOPHEN LEVEL: Acetaminophen (Tylenol), Serum: <10 ug/mL — ABNORMAL LOW (ref 10–30)

## 2021-01-13 LAB — CBG MONITORING, ED: Glucose-Capillary: 108 mg/dL — ABNORMAL HIGH (ref 70–99)

## 2021-01-13 LAB — ETHANOL: Alcohol, Ethyl (B): <10 mg/dL (ref <10)

## 2021-01-13 LAB — SALICYLATE LEVEL: Salicylate Lvl: <7 mg/dL — ABNORMAL LOW (ref 7.0–30.0)

## 2021-01-13 MED ORDER — OLANZAPINE 5 MG PO TABS: 7.5000 mg | ORAL_TABLET | Freq: Two times a day (BID) | ORAL | Status: DC

## 2021-01-13 MED ORDER — OLANZAPINE 5 MG PO TABS
7.5000 mg | ORAL_TABLET | Freq: Every day | ORAL | Status: DC
  Administered 2021-01-14 – 2021-01-15 (×2): 7.5 mg via ORAL
  Filled 2021-01-13 (×2): qty 2

## 2021-01-13 MED ORDER — OLANZAPINE 5 MG PO TABS
7.5000 mg | ORAL_TABLET | Freq: Two times a day (BID) | ORAL | Status: DC
  Administered 2021-01-13: 7.5 mg via ORAL
  Filled 2021-01-13: qty 2

## 2021-01-13 MED ORDER — DIPHENHYDRAMINE HCL 50 MG/ML IJ SOLN
50.0000 mg | Freq: Three times a day (TID) | INTRAMUSCULAR | Status: DC | PRN
  Administered 2021-01-13: 50 mg via INTRAMUSCULAR
  Filled 2021-01-13: qty 1

## 2021-01-13 MED ORDER — DIPHENHYDRAMINE HCL 50 MG/ML IJ SOLN: 50.0000 mg | Freq: Three times a day (TID) | INTRAMUSCULAR | Status: DC | PRN

## 2021-01-13 MED ORDER — VALBENAZINE TOSYLATE 40 MG PO CAPS
40.0000 mg | ORAL_CAPSULE | Freq: Every day | ORAL | Status: DC
  Administered 2021-01-13 – 2021-01-14 (×2): 40 mg via ORAL
  Filled 2021-01-13 (×2): qty 1

## 2021-01-13 MED ORDER — HALOPERIDOL LACTATE 5 MG/ML IJ SOLN
5.0000 mg | Freq: Once | INTRAMUSCULAR | Status: AC
  Administered 2021-01-13: 5 mg via INTRAMUSCULAR
  Filled 2021-01-13: qty 1

## 2021-01-13 NOTE — Progress Notes (Signed)
Per Karen Best, patient meets criteria for inpatient treatment. There are no available or appropriate beds at [facility] today. CSW faxed referrals to the following facilities for review:  [place] No Request Sent N/A [address], [place] [address] [phone] --  [facility]  Pending - No Request Sent N/A [address], [place] [address] [id number] --  [place] No Request Sent N/A [address] [place] [address] [phone] --  [facility]  Pending - No Request Sent N/A [facility] [address] [place] [address] [phone] --  [facility]  Pending - No Request Sent N/A [address] [place] [address] [phone] --  [facility]  Pending - No Request Sent N/A [address], [place] [address] [phone] --  [facility]  Pending - No Request Sent N/A [address] [place], [place] [address] [phone] --  [facility]  Pending - No Request Sent N/A [address] [place] [address] [address] --  [facility]  Pending - No Request Sent N/A [address], [place] [address] [facility]  Pending - No Request Sent N/A [address] [place] [address] [phone] --  [facility]  Pending - No Request Sent N/A [id number]ene Er[address] [place] [address] [phone] --  [place]  Pending - No Request Sent N/A [address] [place] [address] [phone] --  [facility]  Pending - No Request Sent N/A [place], [place] [address] [place] --  [facility]  Pending - No  Request Sent N/A [address], [place] [address] [phone] --  [facility]  Pending - No Request Sent N/A [address] [place] [address] [phone] --  [facility]  Pending - No Request Sent N/A [address], [place] [address] [phone] --  [facility]  Pending - No Request Sent N/A [address], [place] [address] [phone] --  [facility]  Pending - No Request Sent N/A [address] [place] [address] [phone] --  [facility]  Pending - No Request Sent N/A [address] [place] [address] [phone] --  [place]  Pending - No Request Sent N/A [address] [place], [place] [address] [phone] --  [place] No Request Sent N/A [place] [place] [address] [phone] --  [facility]  Pending - No Request Sent N/A [address] [place] [address] [phone] --   TTS will continue to seek bed placement.  [place]ie [place], MSW, [place], LCAS-A Phone: [phone] Disposition/TOC

## 2021-01-13 NOTE — ED Notes (Signed)
Pt belongings placed in cabinet above nurses station.

## 2021-01-13 NOTE — ED Notes (Signed)
Dinner tray given

## 2021-01-13 NOTE — BH Assessment (Signed)
@  87, Requested Joellen Jersey, RN to place the TTS machine in patient's room.

## 2021-01-13 NOTE — ED Notes (Addendum)
I woke up pt for TTS consult. Pt was unable to speak possibly due to EPS reaction. I explained that pt was given haldol around 3 am. Pt communicated that she should not take haldol. Haldol is not listed as an allergy. Will add it to pt's allergies now.

## 2021-01-13 NOTE — ED Notes (Signed)
Lunch tray given. 

## 2021-01-13 NOTE — ED Notes (Addendum)
Pt unable to talk. Appears to have involuntary movement of mouth and drooling. Pt has not urinated since 7am. Gave dinner tray.

## 2021-01-13 NOTE — ED Notes (Addendum)
Patient given applesauce, water, warm blanket. Patient dancing around the room, nonstop talking, manic behavior. Patient having an entire conversation with the ground. Patient thinking someone is in the ground and is scaring her.

## 2021-01-13 NOTE — ED Triage Notes (Signed)
Pt BIB police. Pt's roommate called out because she was having an "episode". Pt speaking continuously about being a millionaire. Pt states she hasn't slept in 23 years. Her mucous membranes are extremely dry. Pt erratic and speaking of a man in her closet trying to kill her.

## 2021-01-13 NOTE — Consult Note (Addendum)
[facility] Face-to-Face Psychiatry Consult   Reason for Consult:  psych consult Referring Physician:  Montine Circle, PA-C Patient Identification: Karen Best MRN:  [id number] Principal Diagnosis: Schizoaffective disorder, bipolar type ([place]) Diagnosis:  Principal Problem:   Schizoaffective disorder, bipolar type ([place]) Active Problems:   Positive hepatitis C antibody test   Toxic metabolic encephalopathy   Total Time spent with patient: 20 minutes  Subjective:   Karen Best is a 65 y.o. female patient admitted with manic state.   0810: Provider notified patient was possibly having reaction to Haldol given at 0306; appeared to be "talking with her tongue". Benadryl 50 mg IM ordered q8hr prn. On chart review, patient noted to have history of tardive dyskinesia and previously taking Valbenazine (Ingrezza) 40 mg. Medication restarted.  On assessment patient presents elevated and disorganized. Involuntary movements noted of mouth (horizontal movements and lip smacking) and upper body. Patient does appear to be speaking more using her tongue, speech not as clear; attempts to answer questions appropriately. Patient states she is at the hospital "because of my roommate". Chart notes patient was previously followed by [facility] Red[facility]; patient states "they stopped coming" and that she is "not seeing nobody" at this time. She denies any suicidal ideations; endorses homicidal ideations towards "the man in my closet, Imma kill him because he is trying to kill me". She remains delusional and continues to endorse visual hallucinations of a man. She was observed talking to someone not present and appears to be responding to some external/internal stimuli.    HPI:   Karen Best is a 65 year old female with a past history noted for schizoaffective disorder, bipolar type presented to [facility] via law enforcement with the chief complaint of manic behavior. 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 RN noted patient's mucous membranes were noticeably dry.   Past Psychiatric History: schizoaffective disorder, bipolar type  Risk to Self:   Risk to Others:   Prior Inpatient Therapy:   Prior Outpatient Therapy:    Past Medical History:  Past Medical History:  Diagnosis Date   Asthma    Bipolar 1 disorder ([place])    Fibromyalgia    Hypertension    Schizophrenia ([place])    Tobacco abuse     Past Surgical History:  Procedure Laterality Date   CESAREAN SECTION     TUBAL LIGATION     Family History:  Family History  Problem Relation Age of Onset   Liver cancer Neg Hx    Liver disease Neg Hx    Family Psychiatric  History: not noted Social History:  Social History   Substance and Sexual Activity  Alcohol Use Yes   Comment: occ     Social History   Substance and Sexual Activity  Drug Use Yes   Types: Cocaine   Comment: crack cocaine (last use 10/25/2018)    Social History   Socioeconomic History   Marital status: Legally Separated    Spouse name: Not on file   Number of children: Not on file   Years of education: Not on file   Highest education level: Not on file  Occupational History   Not on file  Tobacco Use   Smoking status: Every Day    Packs/day: 0.50    Types: Cigarettes   Smokeless tobacco: Never  Vaping  Use   Vaping Use: Never used  Substance and Sexual Activity   Alcohol use: Yes    Comment: occ   Drug use: Yes    Types: Cocaine    Comment: crack cocaine (last use 10/25/2018)   Sexual activity: Yes    Birth control/protection: None  Other Topics Concern   Not on file  Social History Narrative   Not on file   Social Determinants of Health   Financial Resource Strain: Not on file  Food Insecurity: Not on file  Transportation Needs: Not on file  Physical Activity: Not on file  Stress:  Not on file  Social Connections: Not on file   Additional Social History:    Allergies:   Allergies  Allergen Reactions   Haloperidol And Related     Extrapyramidal symptoms    Labs:  Results for orders placed or performed during the hospital encounter of 01/13/21 (from the past 48 hour(s))  CBC     Status: Abnormal   Collection Time: 01/13/21  2:20 AM  Result Value Ref Range   WBC 8.8 4.0 - 10.5 K/uL   RBC 5.73 (H) 3.87 - 5.11 MIL/uL   Hemoglobin 13.5 12.0 - 15.0 g/dL   HCT 43.4 36.0 - 46.0 %   MCV 75.7 (L) 80.0 - 100.0 fL   MCH 23.6 (L) 26.0 - 34.0 pg   MCHC 31.1 30.0 - 36.0 g/dL   RDW 15.9 (H) 11.5 - 15.5 %   Platelets 328 150 - 400 K/uL   nRBC 0.0 0.0 - 0.2 %    Comment: Performed at [facility], [place] [address], [place], [place] [address]  Comprehensive metabolic panel     Status: Abnormal   Collection Time: 01/13/21  2:20 AM  Result Value Ref Range   Sodium 146 (H) 135 - 145 mmol/L   Potassium 3.7 3.5 - 5.1 mmol/L   Chloride 111 98 - 111 mmol/L   CO2 23 22 - 32 mmol/L   Glucose, Bld 136 (H) 70 - 99 mg/dL    Comment: Glucose reference range applies only to samples taken after fasting for at least 8 hours.   BUN 8 8 - 23 mg/dL   Creatinine, Ser 0.73 0.44 - 1.00 mg/dL   Calcium 10.9 (H) 8.9 - 10.3 mg/dL   Total Protein 9.1 (H) 6.5 - 8.1 g/dL   Albumin 4.3 3.5 - 5.0 g/dL   AST 29 15 - 41 U/L   ALT 20 0 - 44 U/L   Alkaline Phosphatase 72 38 - 126 U/L   Total Bilirubin 0.7 0.3 - 1.2 mg/dL   GFR, Estimated >60 >60 mL/min    Comment: (NOTE) Calculated using the CKD-EPI Creatinine Equation (2021)    Anion gap 12 5 - 15    Comment: Performed at [facility], [place] [address] [place], [place] [address]  Ethanol     Status: None   Collection Time: 01/13/21  2:20 AM  Result Value Ref Range   Alcohol, Ethyl (B) <10 <10 mg/dL    Comment: (NOTE) Lowest detectable limit for serum alcohol is 10 mg/dL.  For medical purposes  only. Performed at [facility], [place] [address] [place], [place] [id number]   Salicylate level     Status: Abnormal   Collection Time: 01/13/21  2:20 AM  Result Value Ref Range   Salicylate Lvl Q000111Q (L) 7.0 - 30.0 mg/dL    Comment: Performed at [facility], [place] [name] [place], [place] [address]  Acetaminophen level     Status: Abnormal   Collection Time: 01/13/21  2:20 AM  Result Value Ref Range   Acetaminophen (Tylenol), Serum <10 (L) 10 - 30 ug/mL    Comment: (NOTE) Therapeutic concentrations vary significantly. A range of 10-30 ug/mL  may be an effective concentration for many patients. However, some  are best treated at concentrations outside of this range. Acetaminophen concentrations >150 ug/mL at 4 hours after ingestion  and >50 ug/mL at 12 hours after ingestion are often associated with  toxic reactions.  Performed at [facility], [place] [address], [place], [place] [address]   CBG monitoring, ED     Status: Abnormal   Collection Time: 01/13/21  3:05 AM  Result Value Ref Range   Glucose-Capillary 108 (H) 70 - 99 mg/dL    Comment: Glucose reference range applies only to samples taken after fasting for at least 8 hours.  Resp Panel by RT-PCR (Flu A&B, Covid) Nasopharyngeal Swab     Status: None   Collection Time: 01/13/21  3:32 AM   Specimen: Nasopharyngeal Swab; Nasopharyngeal(NP) swabs in vial transport medium  Result Value Ref Range   SARS Coronavirus 2 by RT PCR NEGATIVE NEGATIVE    Comment: (NOTE) SARS-CoV-2 target nucleic acids are NOT DETECTED.  The SARS-CoV-2 RNA is generally detectable in upper respiratory specimens during the acute phase of infection. The lowest concentration of SARS-CoV-2 viral copies this assay can detect is 138 copies/mL. A negative result does not preclude SARS-Cov-2 infection and should not be used as the sole basis for treatment or other patient management decisions. A  negative result may occur with  improper specimen collection/handling, submission of specimen other than nasopharyngeal swab, presence of viral mutation(s) within the areas targeted by this assay, and inadequate number of viral copies(<138 copies/mL). A negative result must be combined with clinical observations, patient history, and epidemiological information. The expected result is Negative.  Fact Sheet for Patients:  EntrepreneurPulse.com.au  Fact Sheet for Healthcare Providers:  IncredibleEmployment.be  This test is no t yet approved or cleared by the [place] FDA and  has been authorized for detection and/or diagnosis of SARS-CoV-2 by FDA under an Emergency Use Authorization (EUA). This EUA will remain  in effect (meaning this test can be used) for the duration of the COVID-19 declaration under Section 564(b)(1) of the Act, 21 U.S.C.section 360bbb-3(b)(1), unless the authorization is terminated  or revoked sooner.       Influenza A by PCR NEGATIVE NEGATIVE   Influenza B by PCR NEGATIVE NEGATIVE    Comment: (NOTE) The Xpert Xpress SARS-CoV-2/FLU/RSV plus assay is intended as an aid in the diagnosis of influenza from Nasopharyngeal swab specimens and should not be used as a sole basis for treatment. Nasal washings and aspirates are unacceptable for Xpert Xpress SARS-CoV-2/FLU/RSV testing.  Fact Sheet for Patients: EntrepreneurPulse.com.au  Fact Sheet for Healthcare Providers: IncredibleEmployment.be  This test is not yet approved or cleared by the [place] FDA and has been authorized for detection and/or diagnosis of SARS-CoV-2 by FDA under an Emergency Use Authorization (EUA). This EUA will remain in effect (meaning this test can be used) for the duration of the COVID-19 declaration under Section 564(b)(1) of the Act, 21 U.S.C. section 360bbb-3(b)(1), unless the authorization is terminated  or revoked.  Performed at [facility], [place] [address], [place], [place] [address]     Current Facility-Administered Medications  Medication Dose Route Frequency Provider Last Rate  Last Admin   diphenhydrAMINE (BENADRYL) injection 50 mg  50 mg Intramuscular Q8H PRN Leevy-Johnson, Kadyn Guild A, NP       [START ON 01/14/2021] OLANZapine (ZYPREXA) tablet 7.5 mg  7.5 mg Oral Daily Leevy-Johnson, Shagun Wordell A, NP       valbenazine (INGREZZA) capsule 40 mg  40 mg Oral QHS Leevy-Johnson, Lakiesha Ralphs A, NP       Current Outpatient Medications  Medication Sig Dispense Refill   cephALEXin (KEFLEX) 500 MG capsule 2 caps po bid x 7 days 28 capsule 0   divalproex (DEPAKOTE) 500 MG DR tablet Take 1 tablet (500 mg total) by mouth every 12 (twelve) hours. 60 tablet 0   melatonin 5 MG TABS Take 1 tablet (5 mg total) by mouth at bedtime. 30 tablet 0   metoprolol tartrate (LOPRESSOR) 25 MG tablet Take 0.5 tablets (12.5 mg total) by mouth 2 (two) times daily. 30 tablet 0   OLANZapine (ZYPREXA) 5 MG tablet Take 1 tablet (5 mg total) by mouth 2 (two) times daily. 60 tablet 0   valbenazine (INGREZZA) 40 MG capsule Take 1 capsule (40 mg total) by mouth at bedtime. 30 capsule 0    Musculoskeletal: Strength & Muscle Tone: increased Gait & Station:  unable to observe due to patient status Patient leans: N/A  Psychiatric Specialty Exam:  Presentation  General Appearance: Disheveled  Eye Contact:Fleeting  Speech:Pressured; Garbled  Speech Volume:Normal  Handedness:Right   Mood and Affect  Mood:Labile  Affect:Labile   Thought Process  Thought Processes:Disorganized  Descriptions of Associations:Loose  Orientation:Partial  Thought Content:Illogical; Paranoid Ideation; Tangential  History of Schizophrenia/Schizoaffective disorder:Yes  Duration of Psychotic Symptoms:Greater than six months  Hallucinations:Hallucinations: Visual Description of Visual Hallucinations: man in her room  closet; people after her Ideas of Reference:None  Suicidal Thoughts:Suicidal Thoughts: No Homicidal Thoughts:Homicidal Thoughts: No  Sensorium  Memory:Immediate Fair; Recent Fair; Remote Fair  Judgment:Fair  Insight:Fair   Executive Functions  Concentration:Poor  Attention Span:Poor  Recall:Poor; [place]   Psychomotor Activity  Psychomotor Activity: Psychomotor Activity: Extrapyramidal Side Effects (EPS); Increased Extrapyramidal Side Effects (EPS): Tardive Dyskinesia  Assets  Assets:Desire for Improvement; Resilience; Physical Health   Sleep  Sleep: Sleep: Poor  Physical Exam: Physical Exam Vitals and nursing note reviewed.  HENT:     Head: Normocephalic.     Nose: Nose normal.     Mouth/Throat:     Mouth: Mucous membranes are moist.     Pharynx: Oropharynx is clear.  Eyes:     Pupils: Pupils are equal, round, and reactive to light.  Cardiovascular:     Rate and Rhythm: Normal rate.  Pulmonary:     Effort: Pulmonary effort is normal.  Musculoskeletal:        General: Normal range of motion.     Cervical back: Normal range of motion.  Skin:    General: Skin is warm and dry.  Neurological:     Mental Status: She is alert.  Psychiatric:        Attention and Perception: She perceives visual hallucinations.        Mood and Affect: Mood is elated.        Speech: Speech is rapid and pressured and tangential.        Behavior: Behavior is cooperative.        Thought Content: Thought content is delusional. Thought content includes homicidal ideation.   Review of Systems  Psychiatric/Behavioral:  Positive for hallucinations.   All other systems reviewed  and are negative. Blood pressure 97/66, pulse 73, temperature 98.2 F (36.8 C), resp. rate 17, height '5\' 5"'$  (1.651 m), weight 59 kg, SpO2 97 %. Body mass index is 21.63 kg/m.  Treatment Plan Summary: Daily contact with patient to assess and evaluate symptoms and  progress in treatment, Medication management, and Plan seek inpatient admission for further observation, stabilization, and treatment. Medications started to address psychotic symptoms; home medications (Ingrezza) restarted to address tardive dykinisia symptoms. No outpatient or intensive psychiatric services in place at this time; care coordination referral via [facility] pending.   Disposition: Recommend psychiatric Inpatient admission when medically cleared. Supportive therapy provided about ongoing stressors. Discussed crisis plan, support from social network, calling 911, coming to the Emergency Department, and calling Suicide Hotline.  Inda Merlin, NP 01/13/2021 3:37 PM

## 2021-01-13 NOTE — ED Provider Notes (Signed)
[facility] DEPT Provider Note   CSN: [id number] Arrival date & time: 01/13/21  0201     History Chief Complaint  Patient presents with   Mental Health Problem    Karen Best is a 65 y.o. female.  Patient with past medical history notable for schizophrenia and bipolar presents to the emergency department with a chief complaint of manic behavior.  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  Level 5 caveat applies secondary to psychiatric condition.  The history is provided by the patient. No language interpreter was used.      Past Medical History:  Diagnosis Date   Asthma    Bipolar 1 disorder ([place])    Fibromyalgia    Hypertension    Schizophrenia ([place])    Tobacco abuse     Patient Active Problem List   Diagnosis Date Noted   Malnutrition of moderate degree 11/10/2020   Hypertension    Hyponatremia    Acute encephalopathy 09/18/2020   Schizoaffective disorder, bipolar type ([place]) 05/13/2020   Bacteremia due to Staphylococcus aureus 10/03/2019   Dehydration with hypernatremia 10/03/2019   AKI (acute kidney injury) ([place]) 10/02/2019   Transaminitis 10/02/2019   CVA (cerebral vascular accident) ([place]) 10/02/2019   AMS (altered mental status) 10/01/2019   Lithium toxicity 02/16/2019   Hyperkalemia 02/16/2019   Acute confusion 99991111   Toxic metabolic encephalopathy Q000111Q   Dysarthria 12/23/2018   Elevated blood pressure reading 12/23/2018   Asthma 12/23/2018   Delirium due to another medical condition    Acute UTI (urinary tract infection) 123XX123   Acute metabolic encephalopathy 123XX123   Positive hepatitis C antibody test 06/11/2018    Past Surgical History:  Procedure Laterality Date   CESAREAN SECTION     TUBAL LIGATION       OB History   No obstetric history on file.     Family  History  Problem Relation Age of Onset   Liver cancer Neg Hx    Liver disease Neg Hx     Social History   Tobacco Use   Smoking status: Every Day    Packs/day: 0.50    Types: Cigarettes   Smokeless tobacco: Never  Vaping Use   Vaping Use: Never used  Substance Use Topics   Alcohol use: Yes    Comment: occ   Drug use: Yes    Types: Cocaine    Comment: crack cocaine (last use 10/25/2018)    Home Medications Prior to Admission medications   Medication Sig Start Date End Date Taking? Authorizing Provider  cephALEXin (KEFLEX) 500 MG capsule 2 caps po bid x 7 days 12/02/20   Margarita Mail, PA-C  divalproex (DEPAKOTE) 500 MG DR tablet Take 1 tablet (500 mg total) by mouth every 12 (twelve) hours. 11/17/20   Florencia Reasons, MD  melatonin 5 MG TABS Take 1 tablet (5 mg total) by mouth at bedtime. 11/17/20   Florencia Reasons, MD  metoprolol tartrate (LOPRESSOR) 25 MG tablet Take 0.5 tablets (12.5 mg total) by mouth 2 (two) times daily. 11/17/20   Florencia Reasons, MD  OLANZapine (ZYPREXA) 5 MG tablet Take 1 tablet (5 mg total) by mouth 2 (two) times daily. 11/17/20   Florencia Reasons, MD  valbenazine [facility]) 40 MG capsule Take 1 capsule (40 mg total) by mouth at bedtime. 11/17/20   Florencia Reasons,  MD    Allergies    Patient has no known allergies.  Review of Systems   Review of Systems  Unable to perform ROS: Psychiatric disorder   Physical Exam Updated Vital Signs BP (!) 163/103   Pulse (!) 118   Temp 98.2 F (36.8 C)   Resp 20   Ht '5\' 5"'$  (1.651 m)   Wt 59 kg   SpO2 97%   BMI 21.63 kg/m   Physical Exam Vitals and nursing note reviewed.  Constitutional:      General: She is not in acute distress.    Appearance: She is well-developed.  HENT:     Head: Normocephalic and atraumatic.     Mouth/Throat:     Mouth: Mucous membranes are dry.  Eyes:     Conjunctiva/sclera: Conjunctivae normal.  Cardiovascular:     Rate and Rhythm: Normal rate.     Heart sounds: No murmur heard. Pulmonary:     Effort:  Pulmonary effort is normal. No respiratory distress.  Abdominal:     General: There is no distension.  Musculoskeletal:     Cervical back: Neck supple.     Comments: Moves all extremities  Skin:    General: Skin is warm and dry.  Neurological:     Mental Status: She is alert and oriented to person, place, and time.  Psychiatric:        Mood and Affect: Mood normal.        Behavior: Behavior normal.    ED Results / Procedures / Treatments   Labs (all labs ordered are listed, but only abnormal results are displayed) Labs Reviewed  CBC - Abnormal; Notable for the following components:      Result Value   RBC 5.73 (*)    MCV 75.7 (*)    MCH 23.6 (*)    RDW 15.9 (*)    All other components within normal limits  COMPREHENSIVE METABOLIC PANEL - Abnormal; Notable for the following components:   Sodium 146 (*)    Glucose, Bld 136 (*)    Calcium 10.9 (*)    Total Protein 9.1 (*)    All other components within normal limits  SALICYLATE LEVEL - Abnormal; Notable for the following components:   Salicylate Lvl Q000111Q (*)    All other components within normal limits  ACETAMINOPHEN LEVEL - Abnormal; Notable for the following components:   Acetaminophen (Tylenol), Serum <10 (*)    All other components within normal limits  CBG MONITORING, ED - Abnormal; Notable for the following components:   Glucose-Capillary 108 (*)    All other components within normal limits  RESP PANEL BY RT-PCR (FLU A&B, COVID) ARPGX2  ETHANOL  RAPID URINE DRUG SCREEN, HOSP PERFORMED  URINALYSIS, ROUTINE W REFLEX MICROSCOPIC    EKG None  Radiology No results found.  Procedures Procedures   Medications Ordered in ED Medications  haloperidol lactate (HALDOL) injection 5 mg (5 mg Intramuscular Given 01/13/21 0306)    ED Course  I have reviewed the triage vital signs and the nursing notes.  Pertinent labs & imaging results that were available during my care of the patient were reviewed by me and  considered in my medical decision making (see chart for details).    MDM Rules/Calculators/A&P                           Patient here with manic behavior.  She has history of bipolar 1.  She does  seem to be in a manic state.  She is very tangential.  She does express some paranoia of someone in her closet trying to kill her.  She was recently admitted for toxic encephalopathy, will check labs, and will reassess.  Laboratory work-up is reassuring.  She has no evidence of AKI.  No leukocytosis.  She is not anemic.  Will give IM Haldol to see if we can calm the patient down a little bit.  She is placed under IVC.  Consult to TTS for evaluation for mania. Final Clinical Impression(s) / ED Diagnoses Final diagnoses:  Manic state [facility])    Rx / [place] Orders ED Discharge Orders     None        Montine Circle, PA-C 01/13/21 [id number]    Shanon Rosser, MD 01/13/21 [phone]

## 2021-01-13 NOTE — BH Assessment (Signed)
@  [id number], attempted to complete patient's assessment. However, patient unable to speak. Patient did make attempts and this Clinician observed patient having difficulty. Patient's nurse suspects that her inability to speak could be a reaction from Haldol. Brook, NP, notified. Clinician. unable to complete patient's TTS at this time.

## 2021-01-14 LAB — RAPID URINE DRUG SCREEN, HOSP PERFORMED
Amphetamines: NOT DETECTED
Barbiturates: NOT DETECTED
Benzodiazepines: NOT DETECTED
Cocaine: NOT DETECTED
Opiates: NOT DETECTED
Tetrahydrocannabinol: NOT DETECTED

## 2021-01-14 LAB — URINALYSIS, ROUTINE W REFLEX MICROSCOPIC
Bilirubin Urine: NEGATIVE
Glucose, UA: NEGATIVE mg/dL
Hgb urine dipstick: NEGATIVE
Ketones, ur: NEGATIVE mg/dL
Nitrite: NEGATIVE
Protein, ur: NEGATIVE mg/dL
Specific Gravity, Urine: 1.01 (ref 1.005–1.030)
WBC, UA: >50 WBC/hpf — ABNORMAL HIGH (ref 0–5)
pH: 7 (ref 5.0–8.0)

## 2021-01-14 MED ORDER — CEPHALEXIN 500 MG PO CAPS
500.0000 mg | ORAL_CAPSULE | Freq: Two times a day (BID) | ORAL | Status: DC
  Administered 2021-01-14 – 2021-01-15 (×2): 500 mg via ORAL
  Filled 2021-01-14 (×2): qty 1

## 2021-01-14 NOTE — BH Assessment (Addendum)
[facility] Assessment Progress Note   Per Oneida Alar, NP, this pt requires psychiatric hospitalization at this time.  Pt presents under IVC initiated by EDP J. Orvilla Cornwall, MD.  At 11:24 Valetta Fuller, RN calls from [facility]) to report that pt has been accepted to their facility by Dr Jetta Lout.  Pecolia Ades, NP concurs with this decision.  EDP Daleen Bo, MD and pt's nurse, Destiny, have been notified.  Please call report to [phone] when [facility] arrives to pick pt up for transport.  [place] Coordinator [phone]  Addendum:  Address for accepting facility is:  [address] [place], [place] [address]

## 2021-01-14 NOTE — ED Notes (Signed)
Gave bedside report to [name], RN 

## 2021-01-14 NOTE — BH Assessment (Addendum)
Re-Assessment 01/14/2021:  Clinician requested to re-assess patient on this day. Prior to re-assessment, reviewed patient's chart: "Karen Best is a 65 year old female with a past history noted for schizoaffective disorder, bipolar type presented to [facility] via law enforcement with the chief complaint of manic behavior. 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 RN noted patient's mucous membranes were noticeably dry."  Clinician initiated a TTS assessment with patient via tele assessment. She observed sitting up right in her bed. Clinician experienced difficulty understanding patient as her speech is extremely garbled. Patient observed to have difficulty speaking in a clear manner.  However, Clinician was able to determine that patient  does not have suicidal ideations. However,  endorses homicidal ideations towards "people at [employer] States that someone stole her money. Clinician was unable to discern if patient was speaking of a particular person she has homicidal ideations or [employer] in general. Also, unclear if she has plan and/or intent to harm others. Unknown if patient has a  history of aggressive and/or assault ive behaviors. Patient denies that she has AVH's. Denies feelings of paranoia.   Patient with a history of Schizoaffective Disorder, Bipolar Type. States that she is compliant with medications. Chart notes patient was previously followed by [facility] Redder; patient states "they stopped coming" and that she is "not seeing nobody" at this time.   Patient says that she lives with her son and female friend. States that they are both supportive.   Disposition: Lindon Romp, NP, continues to recommend inpatient treatment. '@[id number]'$ , notified [facility] ACIrine Seal, RN) of patient's bed needs. Patient pending [place] review.

## 2021-01-14 NOTE — ED Provider Notes (Signed)
Emergency Medicine Observation Re-evaluation Note  Karen Best is a 65 y.o. female, seen on rounds today.  Pt initially presented to the ED for complaints of Mental Health Problem Currently, the patient is eating breakfast.  She asked to get her teeth back so she can eat her bacon.  The patient denies abdominal pain, back pain, fever, chills, nausea, vomiting, dysuria or urinary frequency..  Physical Exam  BP (!) 118/53   Pulse 60   Temp 98.5 F (36.9 C)   Resp 16   Ht '5\' 5"'$  (1.651 m)   Wt 59 kg   SpO2 98%   BMI 21.63 kg/m  Physical Exam General: Nontoxic elderly female who is calm and cooperative Cardiac: Normal heart rate Lungs: Normal respiratory rate Psych: Alert, not responding to internal stimuli.  She is cooperative. Abdomen: Soft and nontender to palpation  ED Course / MDM  EKG:   I have reviewed the labs performed to date as well as medications administered while in observation.  Recent changes in the last 24 hours include she continues to have normal vital signs.  Urinalysis and urine drug screen have returned.  The urinalysis is abnormal however it is not a clean catch or catheter sample and the patient does not have urinary tract symptoms.  Therefore, I do not believe that she has a urinary tract infection.  The patient is calm and cooperative.  She is medically cleared for treatment by psychiatry, either inpatient or outpatient, as they prefer.  Plan  Current plan is for psychiatric admission.  BRENTLEE CHERN is not under involuntary commitment.     Daleen Bo, MD 01/14/21 1020

## 2021-01-14 NOTE — ED Notes (Signed)
Meal tray given 

## 2021-01-14 NOTE — BH Assessment (Addendum)
Disposition Note:   Lindon Romp, NP, continues to recommend inpatient treatment. @2257 , notified [facility] ACIrine Seal, RN) of patient's bed needs. Patient pending [place] review. @2315 , provided an update from the [facility], no appropriate beds.   Patient faxed out to the following hospitals for consideration of bed placement.   Destination Service Provider Request Status Selected Services Address Phone Fax Patient Preferred  [facility]  Pending - Request Sent N/A [address], [place] [address] [phone] --  [place] [address] [place] [address] [phone] --  [facility]  Pending - Request Sent N/A [address] [place] [address] [phone] --  [address] [place] [address] [phone] --  [address] [place] [address] [phone] --  [facility]  Pending - Request Sent N/A [place], [place] [address] [phone] --  [place] [address] [place], [place] [address] [phone] --  [place] [address] [place] [address] [phone] --  [facility]  Pending - Request Sent N/A [address] [place] [address] [place] [address], [place] [address] [phone] --  [facility]  Pending - Request Sent N/A [address] [place] [address] [phone] --  [facility]  Pending - Request Sent N/A [address] [place] [address] [phone] --  [facility]  Pending - Request Sent N/A [place], [place] [address] [facility]  Pending - Request Sent N/A [address], [place] [address] [phone] --  [place] N/A [address] [place] [address] [phone] --  [facility]  Pending - Request Sent N/A [address], [place] [address] [phone] --  [facility]  Pending - Request Sent N/A [address] [place] [address] [phone] --  [facility]  Pending - Request Sent N/A [place], [place] [address] [phone] --  [facility]  Pending - Request Sent N/A [address] [place] [address] [phone] --  [place] N/A [place], [place] [address] [phone] --  [place] N/A [address] [place] [address] [phone] --  [facility]  Pending - Request Sent N/A [address] [place]  [address] [phone]

## 2021-01-15 NOTE — ED Notes (Signed)
Attempted to call report however no available nurse to take report. Phone number given.  Will attempt again if call is not returned in 30 minutes.

## 2021-01-15 NOTE — ED Notes (Signed)
Report given to Belenda Cruise, RN at [facility].  Pt transported via [facility] d/t IVC.  D/t AMS e-sign not obtained.

## 2021-01-15 NOTE — ED Notes (Signed)
Sheriff's office contacted for transport to [facility].  Will call report to accepting facility when transportation arrives per request of receiving facility.

## 2021-01-15 NOTE — ED Provider Notes (Signed)
Emergency Medicine Observation Re-evaluation Note  Karen Best is a 65 y.o. female, seen on rounds today.  Pt initially presented to the ED for complaints of Mental Health Problem Currently, the patient is awaiting placement for inpatient psychiatric care.  Physical Exam  BP 125/73   Pulse (!) 58   Temp 98.5 F (36.9 C)   Resp 18   Ht 5\' 5"  (1.651 m)   Wt 59 kg   SpO2 97%   BMI 21.63 kg/m  Physical Exam General: Awake, calm, eating breakfast Cardiac: Extremities well perfused Lungs: Breathing is even and unlabored Psych: No agitation, does not appear to be responding to internal stimuli  ED Course / MDM  EKG:   I have reviewed the labs performed to date as well as medications administered while in observation.  Recent changes in the last 24 hours include awaiting psychiatric inpatient placement.  [facility] by Dr. Jetta Lout.  Transported in stable condition.  Plan  Patient transported for psychiatric care.  NEOLA WORRALL is not under involuntary commitment.     Godfrey Pick, MD 01/15/21 [phone]

## 2021-01-16 LAB — URINE CULTURE

## 2021-01-31 ENCOUNTER — Other Ambulatory Visit: Payer: Self-pay | Admitting: Family Medicine

## 2021-01-31 DIAGNOSIS — R112 Nausea with vomiting, unspecified: Secondary | ICD-10-CM

## 2021-02-01 ENCOUNTER — Ambulatory Visit
Admission: RE | Admit: 2021-02-01 | Discharge: 2021-02-01 | Disposition: A | Discharge status: Home / Self Care | Payer: Medicaid Other | Source: Ambulatory Visit | Attending: Family Medicine | Admitting: Family Medicine

## 2021-02-01 DIAGNOSIS — R112 Nausea with vomiting, unspecified: Secondary | ICD-10-CM

## 2021-02-22 ENCOUNTER — Emergency Department (HOSPITAL_COMMUNITY): Payer: Medicaid Other

## 2021-02-22 ENCOUNTER — Encounter (HOSPITAL_COMMUNITY): Payer: Self-pay | Admitting: Pharmacy Technician

## 2021-02-22 ENCOUNTER — Inpatient Hospital Stay (HOSPITAL_COMMUNITY)
Admission: EM | Admit: 2021-02-22 | Discharge: 2021-02-25 | DRG: 091 | Disposition: A | Discharge status: Home / Self Care | Payer: Medicaid Other | Attending: Family Medicine | Admitting: Family Medicine

## 2021-02-22 DIAGNOSIS — M797 Fibromyalgia: Secondary | ICD-10-CM | POA: Diagnosis present

## 2021-02-22 DIAGNOSIS — T426X5A Adverse effect of other antiepileptic and sedative-hypnotic drugs, initial encounter: Secondary | ICD-10-CM | POA: Diagnosis present

## 2021-02-22 DIAGNOSIS — Z8673 Personal history of transient ischemic attack (TIA), and cerebral infarction without residual deficits: Secondary | ICD-10-CM

## 2021-02-22 DIAGNOSIS — Z20822 Contact with and (suspected) exposure to covid-19: Secondary | ICD-10-CM | POA: Diagnosis present

## 2021-02-22 DIAGNOSIS — G9389 Other specified disorders of brain: Secondary | ICD-10-CM | POA: Diagnosis present

## 2021-02-22 DIAGNOSIS — Z79899 Other long term (current) drug therapy: Secondary | ICD-10-CM

## 2021-02-22 DIAGNOSIS — G9340 Encephalopathy, unspecified: Secondary | ICD-10-CM

## 2021-02-22 DIAGNOSIS — R400 Somnolence: Secondary | ICD-10-CM

## 2021-02-22 DIAGNOSIS — Z635 Disruption of family by separation and divorce: Secondary | ICD-10-CM

## 2021-02-22 DIAGNOSIS — Z87891 Personal history of nicotine dependence: Secondary | ICD-10-CM

## 2021-02-22 DIAGNOSIS — T426X1A Poisoning by other antiepileptic and sedative-hypnotic drugs, accidental (unintentional), initial encounter: Secondary | ICD-10-CM | POA: Diagnosis present

## 2021-02-22 DIAGNOSIS — Z87440 Personal history of urinary (tract) infections: Secondary | ICD-10-CM

## 2021-02-22 DIAGNOSIS — R0681 Apnea, not elsewhere classified: Secondary | ICD-10-CM | POA: Diagnosis present

## 2021-02-22 DIAGNOSIS — J45909 Unspecified asthma, uncomplicated: Secondary | ICD-10-CM | POA: Diagnosis present

## 2021-02-22 DIAGNOSIS — G929 Unspecified toxic encephalopathy: Secondary | ICD-10-CM | POA: Diagnosis present

## 2021-02-22 DIAGNOSIS — I639 Cerebral infarction, unspecified: Secondary | ICD-10-CM | POA: Diagnosis present

## 2021-02-22 DIAGNOSIS — F250 Schizoaffective disorder, bipolar type: Secondary | ICD-10-CM | POA: Diagnosis present

## 2021-02-22 DIAGNOSIS — I10 Essential (primary) hypertension: Secondary | ICD-10-CM | POA: Diagnosis present

## 2021-02-22 DIAGNOSIS — E538 Deficiency of other specified B group vitamins: Secondary | ICD-10-CM | POA: Diagnosis present

## 2021-02-22 DIAGNOSIS — G928 Other toxic encephalopathy: Principal | ICD-10-CM | POA: Diagnosis present

## 2021-02-22 DIAGNOSIS — Z8615 Personal history of latent tuberculosis infection: Secondary | ICD-10-CM

## 2021-02-22 DIAGNOSIS — G4710 Hypersomnia, unspecified: Secondary | ICD-10-CM | POA: Diagnosis present

## 2021-02-22 LAB — I-STAT VENOUS BLOOD GAS, ED
Acid-Base Excess: 2 mmol/L (ref 0.0–2.0)
Bicarbonate: 26.8 mmol/L (ref 20.0–28.0)
Calcium, Ion: 1.23 mmol/L (ref 1.15–1.40)
HCT: 43 % (ref 36.0–46.0)
Hemoglobin: 14.6 g/dL (ref 12.0–15.0)
O2 Saturation: 60 %
Potassium: 4.3 mmol/L (ref 3.5–5.1)
Sodium: 139 mmol/L (ref 135–145)
TCO2: 28 mmol/L (ref 22–32)
pCO2, Ven: 40.9 mmHg — ABNORMAL LOW (ref 44.0–60.0)
pH, Ven: 7.424 (ref 7.250–7.430)
pO2, Ven: 31 mmHg — CL (ref 32.0–45.0)

## 2021-02-22 LAB — COMPREHENSIVE METABOLIC PANEL
ALT: 11 U/L (ref 0–44)
AST: 19 U/L (ref 15–41)
Albumin: 3.2 g/dL — ABNORMAL LOW (ref 3.5–5.0)
Alkaline Phosphatase: 54 U/L (ref 38–126)
Anion gap: 11 (ref 5–15)
BUN: 11 mg/dL (ref 8–23)
CO2: 25 mmol/L (ref 22–32)
Calcium: 10.2 mg/dL (ref 8.9–10.3)
Chloride: 102 mmol/L (ref 98–111)
Creatinine, Ser: 0.88 mg/dL (ref 0.44–1.00)
GFR, Estimated: >60 mL/min (ref >60)
Glucose, Bld: 86 mg/dL (ref 70–99)
Potassium: 4.2 mmol/L (ref 3.5–5.1)
Sodium: 138 mmol/L (ref 135–145)
Total Bilirubin: 0.7 mg/dL (ref 0.3–1.2)
Total Protein: 7.5 g/dL (ref 6.5–8.1)

## 2021-02-22 LAB — CBC WITH DIFFERENTIAL/PLATELET
Abs Immature Granulocytes: 0.06 10*3/uL (ref 0.00–0.07)
Basophils Absolute: 0 10*3/uL (ref 0.0–0.1)
Basophils Relative: 0 %
Eosinophils Absolute: 0.5 10*3/uL (ref 0.0–0.5)
Eosinophils Relative: 8 %
HCT: 43.7 % (ref 36.0–46.0)
Hemoglobin: 13.6 g/dL (ref 12.0–15.0)
Immature Granulocytes: 1 %
Lymphocytes Relative: 40 %
Lymphs Abs: 2.8 10*3/uL (ref 0.7–4.0)
MCH: 23.9 pg — ABNORMAL LOW (ref 26.0–34.0)
MCHC: 31.1 g/dL (ref 30.0–36.0)
MCV: 76.7 fL — ABNORMAL LOW (ref 80.0–100.0)
Monocytes Absolute: 0.7 10*3/uL (ref 0.1–1.0)
Monocytes Relative: 10 %
Neutro Abs: 2.8 10*3/uL (ref 1.7–7.7)
Neutrophils Relative %: 41 %
Platelets: 162 10*3/uL (ref 150–400)
RBC: 5.7 MIL/uL — ABNORMAL HIGH (ref 3.87–5.11)
RDW: 16.5 % — ABNORMAL HIGH (ref 11.5–15.5)
WBC: 6.8 10*3/uL (ref 4.0–10.5)
nRBC: 0 % (ref 0.0–0.2)

## 2021-02-22 LAB — CBG MONITORING, ED: Glucose-Capillary: 79 mg/dL (ref 70–99)

## 2021-02-22 LAB — SALICYLATE LEVEL: Salicylate Lvl: <7 mg/dL — ABNORMAL LOW (ref 7.0–30.0)

## 2021-02-22 LAB — ACETAMINOPHEN LEVEL: Acetaminophen (Tylenol), Serum: <10 ug/mL — ABNORMAL LOW (ref 10–30)

## 2021-02-22 LAB — RAPID URINE DRUG SCREEN, HOSP PERFORMED
Amphetamines: NOT DETECTED
Barbiturates: NOT DETECTED
Benzodiazepines: NOT DETECTED
Cocaine: NOT DETECTED
Opiates: NOT DETECTED
Tetrahydrocannabinol: NOT DETECTED

## 2021-02-22 LAB — URINALYSIS, ROUTINE W REFLEX MICROSCOPIC
Bilirubin Urine: NEGATIVE
Glucose, UA: NEGATIVE mg/dL
Ketones, ur: NEGATIVE mg/dL
Nitrite: NEGATIVE
Protein, ur: NEGATIVE mg/dL
Specific Gravity, Urine: 1.009 (ref 1.005–1.030)
pH: 6 (ref 5.0–8.0)

## 2021-02-22 LAB — RESP PANEL BY RT-PCR (FLU A&B, COVID) ARPGX2
Influenza A by PCR: NEGATIVE
Influenza B by PCR: NEGATIVE
SARS Coronavirus 2 by RT PCR: NEGATIVE

## 2021-02-22 LAB — PROTIME-INR
INR: 0.9 (ref 0.8–1.2)
Prothrombin Time: 12.3 seconds (ref 11.4–15.2)

## 2021-02-22 LAB — CK: Total CK: 28 U/L — ABNORMAL LOW (ref 38–234)

## 2021-02-22 LAB — TROPONIN I (HIGH SENSITIVITY)
Troponin I (High Sensitivity): 4 ng/L (ref <18)
Troponin I (High Sensitivity): 5 ng/L (ref <18)

## 2021-02-22 LAB — VALPROIC ACID LEVEL: Valproic Acid Lvl: 146 ug/mL — ABNORMAL HIGH (ref 50.0–100.0)

## 2021-02-22 LAB — ETHANOL: Alcohol, Ethyl (B): <10 mg/dL (ref <10)

## 2021-02-22 LAB — AMMONIA: Ammonia: 30 umol/L (ref 9–35)

## 2021-02-22 MED ORDER — LACTATED RINGERS IV BOLUS
1000.0000 mL | Freq: Once | INTRAVENOUS | Status: AC
  Administered 2021-02-22: 1000 mL via INTRAVENOUS

## 2021-02-22 MED ORDER — NALOXONE HCL 2 MG/2ML IJ SOSY
1.0000 mg | PREFILLED_SYRINGE | Freq: Once | INTRAMUSCULAR | Status: AC
  Administered 2021-02-22: 1 mg via INTRAVENOUS
  Filled 2021-02-22: qty 2

## 2021-02-22 MED ORDER — NALOXONE HCL 4 MG/10ML IJ SOLN: 1.0000 mg/h | INTRAVENOUS | Status: DC

## 2021-02-22 MED ORDER — METOPROLOL TARTRATE 12.5 MG HALF TABLET
12.5000 mg | ORAL_TABLET | Freq: Two times a day (BID) | ORAL | Status: DC
  Administered 2021-02-22 – 2021-02-25 (×5): 12.5 mg via ORAL
  Filled 2021-02-22 (×6): qty 1

## 2021-02-22 MED ORDER — LACTATED RINGERS IV SOLN: INTRAVENOUS | Status: DC

## 2021-02-22 MED ORDER — NALOXONE HCL 2 MG/2ML IJ SOSY: 1.0000 mg | PREFILLED_SYRINGE | INTRAMUSCULAR | Status: DC | PRN

## 2021-02-22 MED ORDER — NALOXONE HCL 2 MG/2ML IJ SOSY
1.0000 mg | PREFILLED_SYRINGE | Freq: Once | INTRAMUSCULAR | Status: AC
  Administered 2021-02-22: 1 mg via INTRAVENOUS

## 2021-02-22 MED ORDER — ENOXAPARIN SODIUM 40 MG/0.4ML IJ SOSY
40.0000 mg | PREFILLED_SYRINGE | INTRAMUSCULAR | Status: DC
  Administered 2021-02-22 – 2021-02-24 (×3): 40 mg via SUBCUTANEOUS
  Filled 2021-02-22 (×3): qty 0.4

## 2021-02-22 MED ORDER — NALOXONE HCL 0.4 MG/ML IJ SOLN
0.4000 mg | Freq: Once | INTRAMUSCULAR | Status: AC
  Administered 2021-02-22: 0.4 mg via INTRAVENOUS

## 2021-02-22 NOTE — Progress Notes (Signed)
Patient displays no s/s of distress. Patient is more alert and responsive. Oriented to self and DOB.   02/22/21 2000  Assess: MEWS Score  Temp 97.9 F (36.6 C)  BP (!) 146/74  Pulse Rate 70  ECG Heart Rate 70  Resp 11  SpO2 97 %  O2 Device Room Air  Assess: MEWS Score  MEWS Temp 0  MEWS Systolic 0  MEWS Pulse 0  MEWS RR 1  MEWS LOC 1  MEWS Score 2  MEWS Score Color Yellow  Assess: if the MEWS score is Yellow or Red  Were vital signs taken at a resting state? Yes  Focused Assessment No change from prior assessment  Early Detection of Sepsis Score *See Row Information* Low  MEWS guidelines implemented *See Row Information* No, previously yellow, continue vital signs every 4 hours  Treat  MEWS Interventions Other (Comment) (Cont VS every 4 hrs)  Pain Scale 0-10  Pain Score 0  Take Vital Signs  Increase Vital Sign Frequency   (cont VS every 4 hours and monitor)  Document  Patient Outcome Other (Comment) (stable;more alert)  Progress note created (see row info) Yes

## 2021-02-22 NOTE — ED Notes (Signed)
Pt remains somnolent. RR 10-13.

## 2021-02-22 NOTE — ED Triage Notes (Signed)
Pt bib ems from home with reports of generalized fatigue. NA reports worsening fatigue over the last few weeks. Pupils dilated on assessment. Pt slow to respond, does answer questions appropriately. Pt complains of lower back pain. NSR on monitor, 12 lead unremarkable. CBG 146. 18g LAC. BP 140/50. Negative on stroke screen. Pt with slurred speech, worse than baseline.

## 2021-02-22 NOTE — H&P (Addendum)
History and Physical    Karen Best CXK:[id number] DOB: 06/28/1955 DOA: 02/22/2021  PCP[facility]:  [facility]  Patient coming from:  Home - lives alone   Chief Complaint: altered mental status.   HPI: Karen Best is a 65 y.o. female with medical history significant of  schizophrenia and bipolar, history of CVA, hypertension, and tobacco abuse who presented to ED with AMS and somnolence. She is checked on by her neighbor who noticed her to be more sleepy yesterday. Her aide came to see her today and she called EMS because her "eyes looked funny." She was responsive, but slow. History is limited due to her somnolence. She does wake up, but doesn't really answer my questions. She doesn't know why she is here and denies any pain anywhere. Other review of systems can't be obtained   I called her Karen, Mr. Fredonia Best, who checks on her and he states she has good days and bad days due to her mental illness. She was in a group home, but she didn't like it there and he got her out of the group home and since then she has been by herself and seems to be in and out of it. He denies any recent illness, but states she goes to ER at least once/month. She does give herself her own medication. She was recently started on new medication about 3 weeks ago. (Depakote, zyprexa and ingrezza).   No recent drug or alcohol, remote hx 5+ years ago used crack cocaine.   ED Course: vitals: Afebrile, blood pressure 114/87, heart rate 84, respiratory rate 12, oxygen 97% on room air. GCS on arrival was 11.  Pertinent labs: Valproic acid level 146, Chest x-ray: Low volumes patchy airspace opacities at the left lung base superimposed on chronic interstitial lung disease. CT head: No acute intracranial abnormality. In ED patient was bolused 1 L and started on maintenance IV fluids.  She was given Narcan.  Neurology was consulted and TRH was asked to admit.  Review of  Systems: limited/could not be obtained.   Ambulatory Status:  Ambulates with walker     Past Medical History:  Diagnosis Date   Asthma    Bipolar 1 disorder ([place])    Fibromyalgia    Hypertension    Schizophrenia ([place])    Tobacco abuse     Past Surgical History:  Procedure Laterality Date   CESAREAN SECTION     TUBAL LIGATION      Social History   Socioeconomic History   Marital status: Legally Separated    Spouse name: Not on file   Number of children: Not on file   Years of education: Not on file   Highest education level: Not on file  Occupational History   Not on file  Tobacco Use   Smoking status: Every Day    Packs/day: 0.50    Types: Cigarettes   Smokeless tobacco: Never  Vaping Use   Vaping Use: Never used  Substance and Sexual Activity   Alcohol use: Yes    Comment: occ   Drug use: Yes    Types: Cocaine    Comment: crack cocaine (last use 10/25/2018)   Sexual activity: Yes    Birth control/protection: None  Other Topics Concern   Not on file  Social History Narrative   Not on file   Social Determinants of Health   Financial Resource Strain: Not on file  Food Insecurity: Not on file  Transportation  Needs: Not on file  Physical Activity: Not on file  Stress: Not on file  Social Connections: Not on file  Intimate Partner Violence: Not on file    Allergies  Allergen Reactions   Haloperidol And Related     Extrapyramidal symptoms    Family History  Problem Relation Age of Onset   Liver cancer Neg Hx    Liver disease Neg Hx     Prior to Admission medications   Medication Sig Start Date End Date Taking? Authorizing Provider  cephALEXin (KEFLEX) 500 MG capsule 2 caps po bid x 7 days 12/02/20   Margarita Mail, PA-C  divalproex (DEPAKOTE) 500 MG DR tablet Take 1 tablet (500 mg total) by mouth every 12 (twelve) hours. 11/17/20   Florencia Reasons, MD  melatonin 5 MG TABS Take 1 tablet (5 mg total) by mouth at bedtime. 11/17/20   Florencia Reasons, MD  metoprolol  tartrate (LOPRESSOR) 25 MG tablet Take 0.5 tablets (12.5 mg total) by mouth 2 (two) times daily. 11/17/20   Florencia Reasons, MD  OLANZapine (ZYPREXA) 5 MG tablet Take 1 tablet (5 mg total) by mouth 2 (two) times daily. 11/17/20   Florencia Reasons, MD  valbenazine [facility]) 40 MG capsule Take 1 capsule (40 mg total) by mouth at bedtime. 11/17/20   Florencia Reasons, MD    Physical Exam: Vitals:   02/22/21 1430 02/22/21 1445 02/22/21 1500 02/22/21 1545  BP: 123/88 (!) 143/90 138/83 130/90  Pulse: 72 79 66 73  Resp: 10 11 14 10   Temp:      TempSrc:      SpO2: 98% 100% 99% 99%     General:  Appears calm and comfortable and is in NAD. Sleeping/somnolent. Wakes up to name, goes back to sleep.  Eyes:  PERRL, EOMI, normal lids, iris ENT:  grossly normal hearing, lips & tongue, mmm; no teeth Neck:  no LAD, masses or thyromegaly; no carotid bruits Cardiovascular:  RRR, no m/r/g. No LE edema.  Respiratory:   CTA bilaterally with no wheezes/rales/rhonchi.  Normal respiratory effort. Abdomen:  soft, NT, ND, NABS Back:   normal alignment, no CVAT Skin:  no rash or induration seen on limited exam. Small superficial abrasion on left knee.  Musculoskeletal:  limited exam, would not follow directions.  Lower extremity:  No LE edema.  Limited foot exam with no ulcerations.  2+ distal pulses. Psychiatric:  could not assess  Neurologic:  could not assess. Wakes up to name. Does not follow commands.     Radiological Exams on Admission: Independently reviewed - see discussion in A/P where applicable  CT HEAD WO CONTRAST  Result Date: 02/22/2021 CLINICAL DATA:  Weakness and altered mental status. EXAM: CT HEAD WITHOUT CONTRAST TECHNIQUE: Contiguous axial images were obtained from the base of the skull through the vertex without intravenous contrast. COMPARISON:  CT head dated November 07, 2020. FINDINGS: Brain: No evidence of acute infarction, hemorrhage, hydrocephalus, extra-axial collection or mass lesion/mass effect. Vascular:  No hyperdense vessel or unexpected calcification. Skull: Normal. Negative for fracture or focal lesion. Sinuses/Orbits: No acute finding. Other: None. IMPRESSION: 1. No acute intracranial abnormality. Electronically Signed   By: Titus Dubin M.D.   On: 02/22/2021 12:31   DG Chest Port 1 View  Result Date: 02/22/2021 CLINICAL DATA:  worsening fatigue over the last few weeks,hx asthma,htn EXAM: PORTABLE CHEST - 1 VIEW COMPARISON:  11/07/2020 and previous FINDINGS: Relatively low lung volumes. Chronic coarse interstitial opacities, with some increase in airspace disease at the left  lung base. Heart size and mediastinal contours are within normal limits. Aortic Atherosclerosis (ICD10-170.0). No effusion. Visualized bones unremarkable. IMPRESSION: Low volumes. Patchy airspace opacities at the left lung base superimposed on chronic interstitial lung disease. Electronically Signed   By: Lucrezia Europe M.D.   On: 02/22/2021 11:53    EKG: Independently reviewed.  NSR with rate 84 with LAFB; nonspecific ST changes with no evidence of acute ischemia   Labs on Admission: I have personally reviewed the available labs and imaging studies at the time of the admission.  Pertinent labs:  Valproic acid level 146,   Assessment/Plan Active Problems:   Acute encephalopathy -Patient presenting with encephalopathy as evidenced by her somnolent state. Has history of hospital admits for similiar presenting picture.  -Evaluation thus far unremarkable except for elevated valproic acid level.  -Suspect medication misadventure, likely associated with multiple new psychiatric drugs and overdose of depakote. Continue supportive therapy -TSH/B12 pending. Ammonia wnl. UDS pending  -urine not overly impressive, but will culture as hx of UTI in past.  -CXR with chronic course interstitial opacities. No acute consolidation, aspiration precautions.  -no fever, white count. Low suspicion for infection. Follow cbc/fever curve.   -holding medication and psychiatry has been consulted -she is protecting airway and maintaining oxygen at 100%. NPO until more awake and gentle IVF with telemetry monitoring.     Schizoaffective disorder, bipolar type ([place]) -acute encephalopathy likely secondary to psychiatric drugs and OD with depakote. Holding all psych drugs at this time  -consult psychiatry.  -recent Ed visit in 9/22 for manic behavior    CVA (cerebral vascular accident) ([place]) On no ASA or statin.     Hypertension Continue home lopressor     Valproic acid toxicity -holding depakote  -repeat level in AM    ? 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  F/u with her when more alert   Do not think she is safe to return home alone. SW consult done for possible placement.   There is no height or weight on file to calculate BMI.   Level of care:  DVT prophylaxis:  Lovenox  Code Status:  Full - can not confirm  Family Communication: None present; I spoke with the patient's friend by telephone at the time of admission-Mr. Fredonia Best [phone] Disposition Plan:  The patient is from: home  Anticipated d/c is to: SNF/group home per day team. SW consult placed.  Requires inpatient hospitalization and is at significant risk of neurological worsening, requires constant monitoring, assessment and MDM with specialists.    Patient is currently: stable  Consults called: psychiatry  Admission status:  observation   Dragon dictation used in completing this note.    Orma Flaming MD [facility]   How to contact the [facility] Attending or Consulting provider [place] or covering provider during after hours [place], for this patient?  Check the care team in [facility] and look for a) attending/consulting [facility] provider listed and b) the [facility] team listed Log into [email or web address] and use [place]'s universal password to access. If you do not have  the password, please contact the hospital operator. Locate the [facility] provider you are looking for under [facility] and page to a number that you can be directly reached. If you still have difficulty reaching the provider, please page the [facility] (Director on Call) for the Hospitalists listed on amion for assistance.  02/22/2021, 4:08 PM

## 2021-02-22 NOTE — ED Provider Notes (Signed)
[facility] EMERGENCY DEPARTMENT Provider Note   CSN: [id number] Arrival date & time: 02/22/21  1101     History Chief Complaint  Patient presents with   Weakness    Karen Best is a 65 y.o. female.   Weakness  65 year old female with a history of bipolar 1 disorder, hypertension, schizophrenia on Depakote 500 mg twice daily, Zyprexa 5 mg twice daily, Ingrezza 40 mg nightly presenting to the emergency department with altered mental status and somnolence.  The patient lives alone and has no family members.  She is checked on occasionally by a neighbor who states that he last saw her yesterday and noted her to be somewhat somnolent.  He believes that she has been intermittently somnolent over the past 3 days.  This morning he checked on her and she was very somnolent and so EMS was called and transported her to [facility].  She does have a history of crack cocaine use but has not used crack cocaine in over 5 years.  On arrival, the patient was GCS 11 (2-4-5), moving all 4 extremities in response to painful stimuli, able to state her name once aroused to painful stimuli but subsequently somnolent with respiratory depression and intermittent apnea.  Level 5 caveat due to mental status change.  Past Medical History:  Diagnosis Date   Asthma    Bipolar 1 disorder ([place])    Fibromyalgia    Hypertension    Schizophrenia ([place])    Tobacco abuse     Patient Active Problem List   Diagnosis Date Noted   Valproic acid toxicity 02/22/2021   Malnutrition of moderate degree 11/10/2020   Hypertension    Acute encephalopathy 09/18/2020   Schizoaffective disorder, bipolar type ([place]) 05/13/2020   Bacteremia due to Staphylococcus aureus 10/03/2019   AKI (acute kidney injury) ([place]) 10/02/2019   Transaminitis 10/02/2019   CVA (cerebral vascular accident) ([place]) 10/02/2019   AMS (altered mental status) 10/01/2019   Lithium toxicity 02/16/2019   Hyperkalemia 02/16/2019    Acute confusion 64/33/2951   Toxic metabolic encephalopathy 88/41/6606   Asthma 12/23/2018   Delirium due to another medical condition    Acute metabolic encephalopathy 30/16/0109   Positive hepatitis C antibody test 06/11/2018    Past Surgical History:  Procedure Laterality Date   CESAREAN SECTION     TUBAL LIGATION       OB History   No obstetric history on file.     Family History  Problem Relation Age of Onset   Liver cancer Neg Hx    Liver disease Neg Hx     Social History   Tobacco Use   Smoking status: Every Day    Packs/day: 0.50    Types: Cigarettes   Smokeless tobacco: Never  Vaping Use   Vaping Use: Never used  Substance Use Topics   Alcohol use: Yes    Comment: occ   Drug use: Yes    Types: Cocaine    Comment: crack cocaine (last use 10/25/2018)    Home Medications Prior to Admission medications   Medication Sig Start Date End Date Taking? Authorizing Provider  cephALEXin (KEFLEX) 500 MG capsule 2 caps po bid x 7 days 12/02/20   Margarita Mail, PA-C  divalproex (DEPAKOTE) 500 MG DR tablet Take 1 tablet (500 mg total) by mouth every 12 (twelve) hours. 11/17/20   Florencia Reasons, MD  melatonin 5 MG TABS Take 1 tablet (5 mg total) by mouth at bedtime. 11/17/20   Florencia Reasons,  MD  metoprolol tartrate (LOPRESSOR) 25 MG tablet Take 0.5 tablets (12.5 mg total) by mouth 2 (two) times daily. 11/17/20   Florencia Reasons, MD  OLANZapine (ZYPREXA) 5 MG tablet Take 1 tablet (5 mg total) by mouth 2 (two) times daily. 11/17/20   Florencia Reasons, MD  valbenazine [facility]) 40 MG capsule Take 1 capsule (40 mg total) by mouth at bedtime. 11/17/20   Florencia Reasons, MD    Allergies    Haloperidol and related  Review of Systems   Review of Systems  Unable to perform ROS: Mental status change   Physical Exam Updated Vital Signs BP 123/87   Pulse 68   Temp 97.9 F (36.6 C) (Axillary)   Resp 14   Ht 5\' 5"  (1.651 m)   Wt 71.1 kg   SpO2 96%   BMI 26.08 kg/m   Physical Exam Vitals and nursing  note reviewed.  Constitutional:      Appearance: She is well-developed.     Comments: GCS 11 (2-4-5). Somnolent. Will state name when aroused to painful stimuli and briefly follow commands  HENT:     Head: Normocephalic and atraumatic.  Eyes:     Comments: Pupils pinpoint and minimally reactive  Cardiovascular:     Rate and Rhythm: Normal rate and regular rhythm.     Heart sounds: No murmur heard. Pulmonary:     Effort: No respiratory distress.     Breath sounds: Normal breath sounds.     Comments: Respiratory depression noted Abdominal:     General: There is no distension.     Palpations: Abdomen is soft.     Tenderness: There is no abdominal tenderness. There is no guarding.  Musculoskeletal:        General: No deformity or signs of injury.     Cervical back: Normal range of motion and neck supple.  Skin:    General: Skin is warm and dry.     Findings: No lesion or rash.  Neurological:     Comments: MENTAL STATUS EXAM:    Orientation: Somnolent, will arouse to painful stimuli and state name Memory: Will follow commands briefly giving thumbs up on painful stimuli Language: Speech is hoarse, no dysarthria  CRANIAL NERVES:    CN 2 (Optic): Visual fields intact to confrontation.  CN 3,4,6 (EOM): Pupils pinpoint CN 5 (Trigeminal): Unable to assess CN 7 (Facial): No facial weakness or asymmetry.  CN 8 (Auditory): Auditory acuity grossly normal.  CN 9,10 (Glossophar): The uvula is midline, the palate elevates symmetrically.  CN 11 (spinal access):Unable to assess CN 12 (Hypoglossal): The tongue is midline. No atrophy or fasciculations.Marland Kitchen   MOTOR:  Muscle Strength: Will withdraw all four extremities antigravity in response to painful stimuli  COORDINATION:  No tremor  SENSATION:  Responds to painful stimuli all four extremities  GAIT: Not assessed     ED Results / Procedures / Treatments   Labs (all labs ordered are listed, but only abnormal results are displayed) Labs  Reviewed  COMPREHENSIVE METABOLIC PANEL - Abnormal; Notable for the following components:      Result Value   Albumin 3.2 (*)    All other components within normal limits  CBC WITH DIFFERENTIAL/PLATELET - Abnormal; Notable for the following components:   RBC 5.70 (*)    MCV 76.7 (*)    MCH 23.9 (*)    RDW 16.5 (*)    All other components within normal limits  URINALYSIS, ROUTINE W REFLEX MICROSCOPIC - Abnormal; Notable for the  following components:   Hgb urine dipstick SMALL (*)    Leukocytes,Ua LARGE (*)    Bacteria, UA RARE (*)    All other components within normal limits  CK - Abnormal; Notable for the following components:   Total CK 28 (*)    All other components within normal limits  VALPROIC ACID LEVEL - Abnormal; Notable for the following components:   Valproic Acid Lvl 146 (*)    All other components within normal limits  ACETAMINOPHEN LEVEL - Abnormal; Notable for the following components:   Acetaminophen (Tylenol), Serum <10 (*)    All other components within normal limits  SALICYLATE LEVEL - Abnormal; Notable for the following components:   Salicylate Lvl <5.9 (*)    All other components within normal limits  I-STAT VENOUS BLOOD GAS, ED - Abnormal; Notable for the following components:   pCO2, Ven 40.9 (*)    pO2, Ven 31.0 (*)    All other components within normal limits  RESP PANEL BY RT-PCR (FLU A&B, COVID) ARPGX2  URINE CULTURE  ETHANOL  RAPID URINE DRUG SCREEN, HOSP PERFORMED  PROTIME-INR  AMMONIA  TSH  VITAMIN B12  VALPROIC ACID LEVEL  CBC  BASIC METABOLIC PANEL  CBG MONITORING, ED  CBG MONITORING, ED  TROPONIN I (HIGH SENSITIVITY)  TROPONIN I (HIGH SENSITIVITY)    EKG EKG Interpretation  Date/Time:  Friday February 22 2021 11:05:23 EDT Ventricular Rate:  84 PR Interval:  137 QRS Duration: 91 QT Interval:  371 QTC Calculation: 439 R Axis:   -48 Text Interpretation: Sinus rhythm Left anterior fascicular block Abnormal R-wave progression, early  transition Confirmed by Regan Lemming ([id number]) on 02/22/2021 11:33:58 AM  Radiology CT HEAD WO CONTRAST  Result Date: 02/22/2021 CLINICAL DATA:  Weakness and altered mental status. EXAM: CT HEAD WITHOUT CONTRAST TECHNIQUE: Contiguous axial images were obtained from the base of the skull through the vertex without intravenous contrast. COMPARISON:  CT head dated November 07, 2020. FINDINGS: Brain: No evidence of acute infarction, hemorrhage, hydrocephalus, extra-axial collection or mass lesion/mass effect. Vascular: No hyperdense vessel or unexpected calcification. Skull: Normal. Negative for fracture or focal lesion. Sinuses/Orbits: No acute finding. Other: None. IMPRESSION: 1. No acute intracranial abnormality. Electronically Signed   By: Titus Dubin M.D.   On: 02/22/2021 12:31   DG Chest Port 1 View  Result Date: 02/22/2021 CLINICAL DATA:  worsening fatigue over the last few weeks,hx asthma,htn EXAM: PORTABLE CHEST - 1 VIEW COMPARISON:  11/07/2020 and previous FINDINGS: Relatively low lung volumes. Chronic coarse interstitial opacities, with some increase in airspace disease at the left lung base. Heart size and mediastinal contours are within normal limits. Aortic Atherosclerosis (ICD10-170.0). No effusion. Visualized bones unremarkable. IMPRESSION: Low volumes. Patchy airspace opacities at the left lung base superimposed on chronic interstitial lung disease. Electronically Signed   By: Lucrezia Europe M.D.   On: 02/22/2021 11:53    Procedures .Critical Care Performed by: Regan Lemming, MD Authorized by: Regan Lemming, MD   Critical care provider statement:    Critical care time (minutes):  42   Critical care was necessary to treat or prevent imminent or life-threatening deterioration of the following conditions:  CNS failure or compromise and toxidrome   Critical care was time spent personally by me on the following activities:  Discussions with consultants, evaluation of patient's response to  treatment, examination of patient, obtaining history from patient or surrogate, ordering and performing treatments and interventions, ordering and review of laboratory studies, ordering and review of radiographic  studies, pulse oximetry, re-evaluation of patient's condition and review of old charts   Care discussed with: admitting provider     Medications Ordered in ED Medications  lactated ringers infusion ( Intravenous Rate/Dose Change 02/22/21 2149)  enoxaparin (LOVENOX) injection 40 mg (40 mg Subcutaneous Given 02/22/21 1854)  metoprolol tartrate (LOPRESSOR) tablet 12.5 mg (12.5 mg Oral Given 02/22/21 2204)  naloxone [facility]) injection 0.4 mg (0.4 mg Intravenous Given 02/22/21 1135)  lactated ringers bolus 1,000 mL (0 mLs Intravenous Stopped 02/22/21 1356)  naloxone (NARCAN) injection 1 mg (1 mg Intravenous Given 02/22/21 1204)  naloxone (NARCAN) injection 1 mg (1 mg Intravenous Given 02/22/21 1406)  lactated ringers bolus 1,000 mL (0 mLs Intravenous Stopping Infusion hung by another clincian 02/22/21 2007)    ED Course  I have reviewed the triage vital signs and the nursing notes.  Pertinent labs & imaging results that were available during my care of the patient were reviewed by me and considered in my medical decision making (see chart for details).    MDM Rules/Calculators/A&P                           65 year old female with a history of bipolar 1 disorder, hypertension, schizophrenia on Depakote 500 mg twice daily, Zyprexa 5 mg twice daily, Ingrezza 40 mg nightly presenting to the emergency department with altered mental status and somnolence.  The patient lives alone and has no family members.  She is checked on occasionally by a neighbor who states that he last saw her yesterday and noted her to be somewhat somnolent.  He believes that she has been intermittently somnolent over the past 3 days.  This morning he checked on her and she was very somnolent and so EMS was called and  transported her to [facility].  She does have a history of crack cocaine use but has not used crack cocaine in over 5 years.  On arrival, the patient was GCS 11 (2-4-5), moving all 4 extremities in response to painful stimuli, able to state her name once aroused to painful stimuli but subsequently somnolent with respiratory depression and intermittent apnea.  She was administered 0.4 mg and subsequently 1 mg of Narcan with some response noted.  No further episodes of respiratory depression noted.  The patient was placed on end-tidal CO2 monitoring and cardiac telemetry.  Spoke with poison control regarding possible polypharmacy versus Depakote overdose.  Depakote level returned elevated at 146.  Neurology consulted to evaluate for possible subclinical seizures.  Feel that this is less likely at this time.  Feel the patient's somnolence and mental status is most likely due to polypharmacy in the setting of the patient's multiple home sedating medications. Review of the patient's home medications include Depakote 500 mg twice daily, Zyprexa 5 mg twice daily, Ingrezza 40 mg nightly.  Differential includes CVA, hypoglycemia, electrolyte abnormality, opiate overdose, polypharmacy.  The patient maintained airway in the emergency department and was continued on end-tidal CO2 monitoring.  Laboratory evaluation included a UDS which was negative, CK negative, CMP without electrolyte abnormality or renal abnormality or liver abnormality, CBG normal, venous gas with a pH of 7.42, PCO2 41, PO2 31, HCO3 27, COVID-19 and influenza PCR testing resulted negative, valproic acid level did result elevated to 146, troponin normal, acetaminophen and salicylate levels normal, ethanol level normal, ammonia level normal.  The patient also had no asterixis on physical exam.  She was withdrawing her extremities to painful stimuli but  maintained a GCS of 11 despite Narcan administration.  After discussion with poison control, will plan for  continue monitoring on cardiac telemetry with IV fluid resuscitation as needed, continue to monitor for signs of hyperammonemia and liver dysfunction.  The patient's EKG revealed sinus rhythm, ventricular rate 84, PR 137, QRS 91, QTc 439, no ischemic changes.  Neurology was less concerned following evaluation for CVA or subclinical seizure activity, agreed with assessment of likely polypharmacy.  CT imaging of the head without acute abnormality.  Medicine consulted for admission.  Final Clinical Impression(s) / ED Diagnoses Final diagnoses:  Somnolence  Polypharmacy    Rx / DC Orders ED Discharge Orders     None        Regan Lemming, MD 02/22/21 2221

## 2021-02-22 NOTE — ED Notes (Signed)
Minimal response post Narcan administration.

## 2021-02-22 NOTE — ED Notes (Signed)
Pt somnolent on exam. Pt hard to keep awake. Does move all 4 extremities voluntarily.

## 2021-02-23 ENCOUNTER — Encounter (HOSPITAL_COMMUNITY): Payer: Self-pay | Admitting: Family Medicine

## 2021-02-23 ENCOUNTER — Other Ambulatory Visit: Payer: Self-pay

## 2021-02-23 DIAGNOSIS — E538 Deficiency of other specified B group vitamins: Secondary | ICD-10-CM | POA: Diagnosis present

## 2021-02-23 DIAGNOSIS — G929 Unspecified toxic encephalopathy: Secondary | ICD-10-CM | POA: Diagnosis present

## 2021-02-23 DIAGNOSIS — J45909 Unspecified asthma, uncomplicated: Secondary | ICD-10-CM | POA: Diagnosis present

## 2021-02-23 DIAGNOSIS — Z87440 Personal history of urinary (tract) infections: Secondary | ICD-10-CM | POA: Diagnosis not present

## 2021-02-23 DIAGNOSIS — R531 Weakness: Secondary | ICD-10-CM | POA: Diagnosis not present

## 2021-02-23 DIAGNOSIS — G928 Other toxic encephalopathy: Secondary | ICD-10-CM | POA: Diagnosis present

## 2021-02-23 DIAGNOSIS — G9340 Encephalopathy, unspecified: Secondary | ICD-10-CM | POA: Diagnosis not present

## 2021-02-23 DIAGNOSIS — I10 Essential (primary) hypertension: Secondary | ICD-10-CM | POA: Diagnosis present

## 2021-02-23 DIAGNOSIS — T426X1A Poisoning by other antiepileptic and sedative-hypnotic drugs, accidental (unintentional), initial encounter: Secondary | ICD-10-CM | POA: Diagnosis not present

## 2021-02-23 DIAGNOSIS — Z8615 Personal history of latent tuberculosis infection: Secondary | ICD-10-CM | POA: Diagnosis not present

## 2021-02-23 DIAGNOSIS — Z79899 Other long term (current) drug therapy: Secondary | ICD-10-CM | POA: Diagnosis not present

## 2021-02-23 DIAGNOSIS — Z87891 Personal history of nicotine dependence: Secondary | ICD-10-CM | POA: Diagnosis not present

## 2021-02-23 DIAGNOSIS — R0681 Apnea, not elsewhere classified: Secondary | ICD-10-CM | POA: Diagnosis present

## 2021-02-23 DIAGNOSIS — M797 Fibromyalgia: Secondary | ICD-10-CM | POA: Diagnosis present

## 2021-02-23 DIAGNOSIS — G9389 Other specified disorders of brain: Secondary | ICD-10-CM | POA: Diagnosis present

## 2021-02-23 DIAGNOSIS — R400 Somnolence: Secondary | ICD-10-CM | POA: Diagnosis not present

## 2021-02-23 DIAGNOSIS — G4710 Hypersomnia, unspecified: Secondary | ICD-10-CM | POA: Diagnosis present

## 2021-02-23 DIAGNOSIS — I639 Cerebral infarction, unspecified: Secondary | ICD-10-CM | POA: Diagnosis present

## 2021-02-23 DIAGNOSIS — Z20822 Contact with and (suspected) exposure to covid-19: Secondary | ICD-10-CM | POA: Diagnosis present

## 2021-02-23 DIAGNOSIS — Z635 Disruption of family by separation and divorce: Secondary | ICD-10-CM | POA: Diagnosis not present

## 2021-02-23 DIAGNOSIS — F250 Schizoaffective disorder, bipolar type: Secondary | ICD-10-CM | POA: Diagnosis present

## 2021-02-23 DIAGNOSIS — Z8673 Personal history of transient ischemic attack (TIA), and cerebral infarction without residual deficits: Secondary | ICD-10-CM | POA: Diagnosis not present

## 2021-02-23 DIAGNOSIS — T426X5A Adverse effect of other antiepileptic and sedative-hypnotic drugs, initial encounter: Secondary | ICD-10-CM | POA: Diagnosis present

## 2021-02-23 LAB — CBC
HCT: 39.6 % (ref 36.0–46.0)
Hemoglobin: 12.5 g/dL (ref 12.0–15.0)
MCH: 24 pg — ABNORMAL LOW (ref 26.0–34.0)
MCHC: 31.6 g/dL (ref 30.0–36.0)
MCV: 76 fL — ABNORMAL LOW (ref 80.0–100.0)
Platelets: 147 10*3/uL — ABNORMAL LOW (ref 150–400)
RBC: 5.21 MIL/uL — ABNORMAL HIGH (ref 3.87–5.11)
RDW: 16.4 % — ABNORMAL HIGH (ref 11.5–15.5)
WBC: 4.1 10*3/uL (ref 4.0–10.5)
nRBC: 0 % (ref 0.0–0.2)

## 2021-02-23 LAB — BASIC METABOLIC PANEL
Anion gap: 7 (ref 5–15)
BUN: 8 mg/dL (ref 8–23)
CO2: 24 mmol/L (ref 22–32)
Calcium: 9.1 mg/dL (ref 8.9–10.3)
Chloride: 106 mmol/L (ref 98–111)
Creatinine, Ser: 0.73 mg/dL (ref 0.44–1.00)
GFR, Estimated: >60 mL/min (ref >60)
Glucose, Bld: 76 mg/dL (ref 70–99)
Potassium: 4.3 mmol/L (ref 3.5–5.1)
Sodium: 137 mmol/L (ref 135–145)

## 2021-02-23 LAB — SALICYLATE LEVEL: Salicylate Lvl: <7 mg/dL — ABNORMAL LOW (ref 7.0–30.0)

## 2021-02-23 LAB — VITAMIN B12: Vitamin B-12: 852 pg/mL (ref 180–914)

## 2021-02-23 LAB — ACETAMINOPHEN LEVEL: Acetaminophen (Tylenol), Serum: <10 ug/mL — ABNORMAL LOW (ref 10–30)

## 2021-02-23 LAB — VALPROIC ACID LEVEL: Valproic Acid Lvl: 112 ug/mL — ABNORMAL HIGH (ref 50.0–100.0)

## 2021-02-23 LAB — TSH: TSH: 0.463 u[IU]/mL (ref 0.350–4.500)

## 2021-02-23 MED ORDER — VALBENAZINE TOSYLATE 40 MG PO CAPS
40.0000 mg | ORAL_CAPSULE | Freq: Every day | ORAL | Status: DC
  Administered 2021-02-24 (×2): 40 mg via ORAL
  Filled 2021-02-23 (×3): qty 1

## 2021-02-23 MED ORDER — OLANZAPINE 5 MG PO TABS: 5.0000 mg | ORAL_TABLET | Freq: Every day | ORAL | Status: DC

## 2021-02-23 MED ORDER — OLANZAPINE 5 MG PO TABS
5.0000 mg | ORAL_TABLET | Freq: Every day | ORAL | Status: DC
  Administered 2021-02-24: 5 mg via ORAL
  Filled 2021-02-23 (×2): qty 1

## 2021-02-23 NOTE — Progress Notes (Signed)
PROGRESS NOTE    Karen Best  DTO:[id number] DOB: [birth year] DOA: 02/22/2021 PCP: [facility]   Chief Complaint  Patient presents with   Weakness   Brief Narrative/Hospital Course:  Karen Best, 65 y.o. female with PMH of schizophrenia/bipolar disorder, CVA, HTN, tobacco abuse  Brought to the ED with altered mental status and somnolence.She is checked on by her neighbor who noticed her to be more sleepy  02/21/21. Her aide came to see her on 10.28 and she called EMS because her "eyes looked funny." She was responsive, but slow-poor historian.  on admission friend Mr Vicente Serene provided history over the phone - and reprorted she has good days and bad days due to her mental illness. She was in a group home, but she didn't like it there and he got her out of the group home and since then she has been by herself and seems to be in and out of it. He denies any recent illness, but states she goes to ER at least once/month. She does give herself her own medication. She was recently started on new medication about 3 weeks ago. (Depakote, zyprexa and ingrezza) ED Course: vitals: Afebrile, blood pressure 114/87, heart rate 84, respiratory rate 12, oxygen 97% on room air. GCS on arrival was 11.  Pertinent labs: Valproic acid level 146, Chest x-ray: Low volumes patchy airspace opacities at the left lung base superimposed on chronic interstitial lung disease. CT head: No acute intracranial abnormality. In ED patient was bolused 1 L and started on maintenance IV fluids.  She was given Narcan.  Neurology was consulted and [facility] was asked to admit   Subjective: Seen this morning, she is more alert awake oriented to self current place but not to current president or current date. Denies any complaint. She is not sure how she ended up in the hospital " May have been taking more Depakote"  Assessment & Plan:  Acute encephalopathy/hypersomnolence: No obvious evidence of  infection and chest x-ray and UA, no fever.  Suspecting secondary to sedative drugs and Depakote.  Depakote level is supratherapeutic.  Mental status much more alert awake but not completely oriented. TSH, X83, UDS, salicylate and Tylenol level are reassuring.  PT OT eval and TOC consult.  ED had discussed with poison control as well as neurology felt less likely subclinical seizure or CVA.  Since patient is improving well continue current management hold Depakote, hold Zyprexa and Ingrezza we will consult psychiatry  Psych affective disorder/bipolar type poa: Depakote level supratherapeutic.  Recent ED visit 9/12 for manic behavior, psychiatry has been consulted, further input.    Valproic acid toxicity-holding Depakote for now.  History of CVA not on aspirin or statin Essential hypertension on Lopressor-blood pressure is controlled  ?  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 DVT prophylaxis: enoxaparin (LOVENOX) injection 40 mg Start: 02/22/21 1645 Code Status:   Code Status: Full Code Family Communication: plan of care discussed with patient at bedside. Status is: Observation  Remains hospitalized for ongoing management of her encephalopathy PT OT evaluation TOC consult, unsafe disposition. Currently not medically stable.  Objective: Vitals last 24 hrs: Vitals:   02/23/21 0400 02/23/21 0417 02/23/21 0500 02/23/21 0600  BP: 95/63 95/63 113/73 (!) 141/67  Pulse: 66 62 63 65  Resp: 10 18 12 12   Temp:  97.6 F (36.4 C)    TempSrc:  Oral    SpO2: 95%  95% 97%  Weight:      Height:       Weight change:   Intake/Output Summary (Last 24 hours) at 02/23/2021 0723 Last data filed at 02/23/2021 0421 Gross per 24 hour  Intake 1993.33 ml  Output 1500 ml  Net 493.33 ml   Net IO Since Admission: 493.33 mL [02/23/21 0723]   Physical Examination: General exam: AA oriented x2, weak,older than  stated age. HEENT:Oral mucosa moist, Ear/Nose WNL grossly,dentition normal. Respiratory system: B/l diminished BS, no use of accessory muscle, non tender. Cardiovascular system: S1 & S2 +,No JVD. Gastrointestinal system: Abdomen soft, NT,ND, BS+. Nervous System:Alert, awake, moving extremities. Extremities: edema none, distal peripheral pulses palpable.  Skin: No rashes, no icterus. MSK: Normal muscle bulk, tone, power.  Medications reviewed:  Scheduled Meds:  enoxaparin (LOVENOX) injection  40 mg Subcutaneous Q24H   metoprolol tartrate  12.5 mg Oral BID   Continuous Infusions:  Diet Order             Diet Heart Room service appropriate? Yes; Fluid consistency: Thin  Diet effective now                 Weight change:   Wt Readings from Last 3 Encounters:  02/22/21 71.1 kg  01/13/21 59 kg  12/02/20 66 kg     Consultants:see note  Procedures:see note Antimicrobials: Anti-infectives (From admission, onward)    None      Culture/Microbiology    Component Value Date/Time   SDES  01/14/2021 0848    URINE, CLEAN CATCH Performed at [facility], [place] [address] [place], [place] [address]    SPECREQUEST  01/14/2021 0848    NONE Performed at [facility], [place] [address], [place], [place] [address]    CULT MULTIPLE SPECIES PRESENT, SUGGEST RECOLLECTION (A) 01/14/2021 0848   REPTSTATUS 01/16/2021 FINAL 01/14/2021 0848    Other culture-see note  Unresulted Labs (From admission, onward)     Start     Ordered   02/22/21 2022  Urine Culture  Once,   R       Question:  Indication  Answer:  Altered mental status (if no other cause identified)   02/22/21 2021          Data Reviewed: I have personally reviewed following labs and imaging studies CBC: Recent Labs  Lab 02/22/21 1128 02/22/21 1138 02/23/21 0422  WBC 6.8  --  4.1  NEUTROABS 2.8  --   --   HGB 13.6 14.6 12.5  HCT 43.7 43.0 39.6  MCV 76.7*  --  76.0*  PLT 162   --  093*   Basic Metabolic Panel: Recent Labs  Lab 02/22/21 1128 02/22/21 1138 02/23/21 0422  NA 138 139 137  K 4.2 4.3 4.3  CL 102  --  106  CO2 25  --  24  GLUCOSE 86  --  76  BUN 11  --  8  CREATININE 0.88  --  0.73  CALCIUM 10.2  --  9.1   GFR: Estimated Creatinine Clearance: 70.2 mL/min (by C-G formula based on SCr of 0.73 mg/dL). Liver Function Tests: Recent Labs  Lab 02/22/21 1128  AST 19  ALT 11  ALKPHOS 54  BILITOT 0.7  PROT 7.5  ALBUMIN 3.2*   No results for input(s): LIPASE, AMYLASE in the last 168 hours. Recent Labs  Lab 02/22/21 1406  AMMONIA 30   Coagulation Profile: Recent Labs  Lab  02/22/21 1128  INR 0.9   Cardiac Enzymes: Recent Labs  Lab 02/22/21 1128  CKTOTAL 28*   BNP (last 3 results) No results for input(s): PROBNP in the last 8760 hours. HbA1C: No results for input(s): HGBA1C in the last 72 hours. CBG: Recent Labs  Lab 02/22/21 1132  GLUCAP 79   Lipid Profile: No results for input(s): CHOL, HDL, LDLCALC, TRIG, CHOLHDL, LDLDIRECT in the last 72 hours. Thyroid Function Tests: Recent Labs    02/23/21 0422  TSH 0.463   Anemia Panel: Recent Labs    02/23/21 0422  VITAMINB12 852   Sepsis Labs: No results for input(s): PROCALCITON, LATICACIDVEN in the last 168 hours.  Recent Results (from the past 240 hour(s))  Resp Panel by RT-PCR (Flu A&B, Covid) Nasopharyngeal Swab     Status: None   Collection Time: 02/22/21  1:25 PM   Specimen: Nasopharyngeal Swab; Nasopharyngeal(NP) swabs in vial transport medium  Result Value Ref Range Status   SARS Coronavirus 2 by RT PCR NEGATIVE NEGATIVE Final    Comment: (NOTE) SARS-CoV-2 target nucleic acids are NOT DETECTED.  The SARS-CoV-2 RNA is generally detectable in upper respiratory specimens during the acute phase of infection. The lowest concentration of SARS-CoV-2 viral copies this assay can detect is 138 copies/mL. A negative result does not preclude SARS-Cov-2 infection and  should not be used as the sole basis for treatment or other patient management decisions. A negative result may occur with  improper specimen collection/handling, submission of specimen other than nasopharyngeal swab, presence of viral mutation(s) within the areas targeted by this assay, and inadequate number of viral copies(<138 copies/mL). A negative result must be combined with clinical observations, patient history, and epidemiological information. The expected result is Negative.  Fact Sheet for Patients:  EntrepreneurPulse.com.au  Fact Sheet for Healthcare Providers:  IncredibleEmployment.be  This test is no t yet approved or cleared by the [place] FDA and  has been authorized for detection and/or diagnosis of SARS-CoV-2 by FDA under an Emergency Use Authorization (EUA). This EUA will remain  in effect (meaning this test can be used) for the duration of the COVID-19 declaration under Section 564(b)(1) of the Act, 21 U.S.C.section 360bbb-3(b)(1), unless the authorization is terminated  or revoked sooner.       Influenza A by PCR NEGATIVE NEGATIVE Final   Influenza B by PCR NEGATIVE NEGATIVE Final    Comment: (NOTE) The Xpert Xpress SARS-CoV-2/FLU/RSV plus assay is intended as an aid in the diagnosis of influenza from Nasopharyngeal swab specimens and should not be used as a sole basis for treatment. Nasal washings and aspirates are unacceptable for Xpert Xpress SARS-CoV-2/FLU/RSV testing.  Fact Sheet for Patients: EntrepreneurPulse.com.au  Fact Sheet for Healthcare Providers: IncredibleEmployment.be  This test is not yet approved or cleared by the [place] FDA and has been authorized for detection and/or diagnosis of SARS-CoV-2 by FDA under an Emergency Use Authorization (EUA). This EUA will remain in effect (meaning this test can be used) for the duration of the COVID-19 declaration  under Section 564(b)(1) of the Act, 21 U.S.C. section 360bbb-3(b)(1), unless the authorization is terminated or revoked.  Performed at [facility], [place] [address], [place], [place] [address]      Radiology Studies: CT HEAD WO CONTRAST  Result Date: 02/22/2021 CLINICAL DATA:  Weakness and altered mental status. EXAM: CT HEAD WITHOUT CONTRAST TECHNIQUE: Contiguous axial images were obtained from the base of the skull through the vertex without intravenous contrast. COMPARISON:  CT head  dated November 07, 2020. FINDINGS: Brain: No evidence of acute infarction, hemorrhage, hydrocephalus, extra-axial collection or mass lesion/mass effect. Vascular: No hyperdense vessel or unexpected calcification. Skull: Normal. Negative for fracture or focal lesion. Sinuses/Orbits: No acute finding. Other: None. IMPRESSION: 1. No acute intracranial abnormality. Electronically Signed   By: Titus Dubin M.D.   On: 02/22/2021 12:31   DG Chest Port 1 View  Result Date: 02/22/2021 CLINICAL DATA:  worsening fatigue over the last few weeks,hx asthma,htn EXAM: PORTABLE CHEST - 1 VIEW COMPARISON:  11/07/2020 and previous FINDINGS: Relatively low lung volumes. Chronic coarse interstitial opacities, with some increase in airspace disease at the left lung base. Heart size and mediastinal contours are within normal limits. Aortic Atherosclerosis (ICD10-170.0). No effusion. Visualized bones unremarkable. IMPRESSION: Low volumes. Patchy airspace opacities at the left lung base superimposed on chronic interstitial lung disease. Electronically Signed   By: Lucrezia Europe M.D.   On: 02/22/2021 11:53     LOS: 0 days   Antonieta Pert, MD [facility]  02/23/2021, 7:23 AM

## 2021-02-23 NOTE — Evaluation (Signed)
Occupational Therapy Evaluation Patient Details Name: Karen Best MRN: [id number] DOB: 09/16/1955 Today's Date: 02/23/2021   History of Present Illness 65 y.o. female with PMH of schizophrenia/bipolar disorder, CVA, HTN, tobacco abuse   Brought to the ED with altered mental status and somnolence.   Clinical Impression   Patient admitted for the diagnosis above.  PTA she lives alone, with a PCA 7x/wk for up to 3 hours a day for IADL assist.  She also has a friend Vicente Serene, who checks in no her a few times each week.  He also assist with groceries and community mobility.   She presents with slowed mentation and some unsteadiness up on her feet, but is probable very close to her baseline.  Given the support she has at home, no post acute OT is anticipated.  No acute needs identified.  Recommend up and walking to the bathroom with staff.       Recommendations for follow up therapy are one component of a multi-disciplinary discharge planning process, led by the attending physician.  Recommendations may be updated based on patient status, additional functional criteria and insurance authorization.   Follow Up Recommendations  No OT follow up    Assistance Recommended at Discharge Intermittent Supervision/Assistance  Functional Status Assessment  Patient has not had a recent decline in their functional status  Equipment Recommendations  None recommended by OT    Recommendations for Other Services       Precautions / Restrictions Precautions Precautions: None Restrictions Weight Bearing Restrictions: No      Mobility Bed Mobility Overal bed mobility: Modified Independent                  Transfers Overall transfer level: Needs assistance Equipment used: None Transfers: Sit to/from Stand;Stand Pivot Transfers Sit to Stand: Modified independent (Device/Increase time) Stand pivot transfers: Supervision         General transfer comment: a little unsteady on her feet,  but probably close to baseline.      Balance Overall balance assessment: Mild deficits observed, not formally tested                                         ADL either performed or assessed with clinical judgement   ADL Overall ADL's : At baseline                                             Vision Patient Visual Report: No change from baseline       Perception Perception Perception: Not tested   Praxis Praxis Praxis: Not tested    Pertinent Vitals/Pain Pain Assessment: No/denies pain     Hand Dominance Right   Extremity/Trunk Assessment Upper Extremity Assessment Upper Extremity Assessment: Overall WFL for tasks assessed   Lower Extremity Assessment Lower Extremity Assessment: Overall WFL for tasks assessed   Cervical / Trunk Assessment Cervical / Trunk Assessment: Kyphotic   Communication Communication Communication: No difficulties   Cognition Arousal/Alertness: Awake/alert Behavior During Therapy: WFL for tasks assessed/performed Overall Cognitive Status: History of cognitive impairments - at baseline  Home Living Family/patient expects to be discharged to:: Private residence Living Arrangements: Alone Available Help at Discharge: Friend(s);Personal care attendant;Available PRN/intermittently Type of Home: House Home Access: Level entry (Level entry in the back)     Home Layout: One level     Bathroom Shower/Tub: Teacher, early years/pre: Standard     Home Equipment: Conservation officer, nature (2 wheels);Shower seat          Prior Functioning/Environment Prior Level of Function : Needs assist       Physical Assist : ADLs (physical)   ADLs (physical): IADLs Mobility Comments: Walks ad lib in the home.  Generally does not go outside unless [place] or her PCA is with her. ADLs Comments: PCA completes cooking and cleaning, and  supervison with showers.        OT Problem List: Impaired balance (sitting and/or standing);Decreased safety awareness      OT Treatment/Interventions:      OT Goals(Current goals can be found in the care plan section) Acute Rehab OT Goals Patient Stated Goal: I'm going back home OT Goal Formulation: With patient Time For Goal Achievement: 02/23/21 Potential to Achieve Goals: Good  OT Frequency:     Barriers to D/C:  Needs prior supports in place.            Co-evaluation              AM-PAC OT "6 Clicks" Daily Activity     Outcome Measure Help from another person eating meals?: None Help from another person taking care of personal grooming?: None Help from another person toileting, which includes using toliet, bedpan, or urinal?: None Help from another person bathing (including washing, rinsing, drying)?: A Little Help from another person to put on and taking off regular upper body clothing?: None Help from another person to put on and taking off regular lower body clothing?: A Little 6 Click Score: 22   End of Session Nurse Communication: Mobility status;Other (comment) (needs pure wick replaced)  Activity Tolerance: Patient tolerated treatment well Patient left: in chair;with call bell/phone within reach;with chair alarm set  OT Visit Diagnosis: Unsteadiness on feet (R26.81);Other symptoms and signs involving cognitive function                Time: 2836-6294 OT Time Calculation (min): 21 min Charges:  OT General Charges $OT Visit: 1 Visit OT Evaluation $OT Eval Moderate Complexity: 1 Mod  02/23/2021  RP, OTR/L  Acute Rehabilitation Services  Office:  [place] 02/23/2021, 4:37 PM

## 2021-02-23 NOTE — Plan of Care (Signed)
  Problem: Education: Goal: Knowledge of General Education information will improve Description: Including pain rating scale, medication(s)/side effects and non-pharmacologic comfort measures 02/23/2021 1127 by Emmaline Life, RN Outcome: Progressing 02/23/2021 1126 by Emmaline Life, RN Outcome: Progressing   Problem: Health Behavior/Discharge Planning: Goal: Ability to manage health-related needs will improve 02/23/2021 1127 by Emmaline Life, RN Outcome: Progressing 02/23/2021 1126 by Emmaline Life, RN Outcome: Progressing

## 2021-02-23 NOTE — Plan of Care (Signed)
  Problem: Education: Goal: Knowledge of General Education information will improve Description Including pain rating scale, medication(s)/side effects and non-pharmacologic comfort measures Outcome: Progressing   Problem: Health Behavior/Discharge Planning: Goal: Ability to manage health-related needs will improve Outcome: Progressing   

## 2021-02-23 NOTE — Consult Note (Signed)
[facility] Consult   Reason for Consult:  ''AMs likely due to psychiatry drugs''. Referring Physician:  Antonieta Pert, MD Patient Identification: Karen Best MRN:  [id number] Principal Diagnosis: Valproic acid toxicity Diagnosis:  Principal Problem:   Valproic acid toxicity Active Problems:   CVA (cerebral vascular accident) ([place])   Schizoaffective disorder, bipolar type ([place])   Acute encephalopathy   Hypertension   Total Time spent with patient: 1 hour  Subjective:   Karen Best is a 65 y.o. female patient admitted with weakness and altered mental status.  HPI: Patient is a 65 y.o. female with history of Schizophrenia/bipolar disorder, CVA, HTN, tobacco abuse who was admitted due to weakness, altered mental status and somnolence. Patient unable to give the chronological sequence of the vents that lead to her current hospitalization other feeling weak and was brought to the hospital. Today, she is alert, awake and able to hold conversation. She states that she got confused about taking her medications and most likely took more medications than prescribed by accident. Her Depakote level on admission(02/22/21) was 146 which has decreased to 112 today. Currently, patient denies depression, anxiety, psychosis, delusions and self harming thoughts. Her home medications are Depakote, zyprexa and ingrezza.  Past Psychiatric History: as above   Risk to Self:  denies Risk to Others:  denies Prior Inpatient Therapy:   Prior Outpatient Therapy:    Past Medical History:  Past Medical History:  Diagnosis Date   Asthma    Bipolar 1 disorder ([place])    Fibromyalgia    Hypertension    Schizophrenia ([place])    Tobacco abuse     Past Surgical History:  Procedure Laterality Date   CESAREAN SECTION     TUBAL LIGATION     Family History:  Family History  Problem Relation Age of Onset   Liver cancer Neg Hx    Liver disease Neg Hx    Family Psychiatric  History:    Social History:  Social History   Substance and Sexual Activity  Alcohol Use Yes   Comment: occ     Social History   Substance and Sexual Activity  Drug Use Yes   Types: Cocaine   Comment: crack cocaine (last use 10/25/2018)    Social History   Socioeconomic History   Marital status: Legally Separated    Spouse name: Not on file   Number of children: Not on file   Years of education: Not on file   Highest education level: Not on file  Occupational History   Not on file  Tobacco Use   Smoking status: Every Day    Packs/day: 0.50    Types: Cigarettes   Smokeless tobacco: Never  Vaping Use   Vaping Use: Never used  Substance and Sexual Activity   Alcohol use: Yes    Comment: occ   Drug use: Yes    Types: Cocaine    Comment: crack cocaine (last use 10/25/2018)   Sexual activity: Yes    Birth control/protection: None  Other Topics Concern   Not on file  Social History Narrative   Not on file   Social Determinants of Health   Financial Resource Strain: Not on file  Food Insecurity: Not on file  Transportation Needs: Not on file  Physical Activity: Not on file  Stress: Not on file  Social Connections: Not on file   Additional Social History:    Allergies:   Allergies  Allergen Reactions   Haloperidol And Related  Extrapyramidal symptoms    Labs:  Results for orders placed or performed during the hospital encounter of 02/22/21 (from the past 48 hour(s))  Comprehensive metabolic panel     Status: Abnormal   Collection Time: 02/22/21 11:28 AM  Result Value Ref Range   Sodium 138 135 - 145 mmol/L   Potassium 4.2 3.5 - 5.1 mmol/L   Chloride 102 98 - 111 mmol/L   CO2 25 22 - 32 mmol/L   Glucose, Bld 86 70 - 99 mg/dL    Comment: Glucose reference range applies only to samples taken after fasting for at least 8 hours.   BUN 11 8 - 23 mg/dL   Creatinine, Ser 0.88 0.44 - 1.00 mg/dL   Calcium 10.2 8.9 - 10.3 mg/dL   Total Protein 7.5 6.5 - 8.1 g/dL    Albumin 3.2 (L) 3.5 - 5.0 g/dL   AST 19 15 - 41 U/L   ALT 11 0 - 44 U/L   Alkaline Phosphatase 54 38 - 126 U/L   Total Bilirubin 0.7 0.3 - 1.2 mg/dL   GFR, Estimated >60 >60 mL/min    Comment: (NOTE) Calculated using the CKD-EPI Creatinine Equation (2021)    Anion gap 11 5 - 15    Comment: Performed at [facility] [address], [place], [place] [address]  CBC WITH DIFFERENTIAL     Status: Abnormal   Collection Time: 02/22/21 11:28 AM  Result Value Ref Range   WBC 6.8 4.0 - 10.5 K/uL   RBC 5.70 (H) 3.87 - 5.11 MIL/uL   Hemoglobin 13.6 12.0 - 15.0 g/dL   HCT 43.7 36.0 - 46.0 %   MCV 76.7 (L) 80.0 - 100.0 fL   MCH 23.9 (L) 26.0 - 34.0 pg   MCHC 31.1 30.0 - 36.0 g/dL   RDW 16.5 (H) 11.5 - 15.5 %   Platelets 162 150 - 400 K/uL   nRBC 0.0 0.0 - 0.2 %   Neutrophils Relative % 41 %   Neutro Abs 2.8 1.7 - 7.7 K/uL   Lymphocytes Relative 40 %   Lymphs Abs 2.8 0.7 - 4.0 K/uL   Monocytes Relative 10 %   Monocytes Absolute 0.7 0.1 - 1.0 K/uL   Eosinophils Relative 8 %   Eosinophils Absolute 0.5 0.0 - 0.5 K/uL   Basophils Relative 0 %   Basophils Absolute 0.0 0.0 - 0.1 K/uL   Immature Granulocytes 1 %   Abs Immature Granulocytes 0.06 0.00 - 0.07 K/uL    Comment: Performed at [facility], [address], [place], [place] [address]  Urinalysis, Routine w reflex microscopic Urine, Clean Catch     Status: Abnormal   Collection Time: 02/22/21 11:28 AM  Result Value Ref Range   Color, Urine YELLOW YELLOW   APPearance CLEAR CLEAR   Specific Gravity, Urine 1.009 1.005 - 1.030   pH 6.0 5.0 - 8.0   Glucose, UA NEGATIVE NEGATIVE mg/dL   Hgb urine dipstick SMALL (A) NEGATIVE   Bilirubin Urine NEGATIVE NEGATIVE   Ketones, ur NEGATIVE NEGATIVE mg/dL   Protein, ur NEGATIVE NEGATIVE mg/dL   Nitrite NEGATIVE NEGATIVE   Leukocytes,Ua LARGE (A) NEGATIVE   RBC / HPF 0-5 0 - 5 RBC/hpf   WBC, UA 21-50 0 - 5 WBC/hpf   Bacteria, UA RARE (A) NONE SEEN   Squamous Epithelial / LPF 0-5 0  - 5    Comment: Performed at [facility], [place] [address], [place],  [address]  Ethanol     Status: None   Collection Time: 02/22/21 11:28 AM  Result Value Ref Range   Alcohol, Ethyl (B) <10 <10 mg/dL    Comment: (NOTE) Lowest detectable limit for serum alcohol is 10 mg/dL.  For medical purposes only. Performed at [facility], [place] [address] [place], [place] [address]   Urine rapid drug screen (hosp performed)     Status: None   Collection Time: 02/22/21 11:28 AM  Result Value Ref Range   Opiates NONE DETECTED NONE DETECTED   Cocaine NONE DETECTED NONE DETECTED   Benzodiazepines NONE DETECTED NONE DETECTED   Amphetamines NONE DETECTED NONE DETECTED   Tetrahydrocannabinol NONE DETECTED NONE DETECTED   Barbiturates NONE DETECTED NONE DETECTED    Comment: (NOTE) DRUG SCREEN FOR MEDICAL PURPOSES ONLY.  IF CONFIRMATION IS NEEDED FOR ANY PURPOSE, NOTIFY LAB WITHIN 5 DAYS.  LOWEST DETECTABLE LIMITS FOR URINE DRUG SCREEN Drug Class                     Cutoff (ng/mL) Amphetamine and metabolites    1000 Barbiturate and metabolites    200 Benzodiazepine                 361 Tricyclics and metabolites     300 Opiates and metabolites        300 Cocaine and metabolites        300 THC                            50 Performed at [facility], [place] [address], [place], [place] [address]   Protime-INR     Status: None   Collection Time: 02/22/21 11:28 AM  Result Value Ref Range   Prothrombin Time 12.3 11.4 - 15.2 seconds   INR 0.9 0.8 - 1.2    Comment: (NOTE) INR goal varies based on device and disease states. Performed at [facility], [place] [address], [place], [place] [address]   CK     Status: Abnormal   Collection Time: 02/22/21 11:28 AM  Result Value Ref Range   Total CK 28 (L) 38 - 234 U/L    Comment: Performed at [facility], [place] [address], [place], [place] [address]  Troponin I (High Sensitivity)     Status: None   Collection Time:  02/22/21 11:28 AM  Result Value Ref Range   Troponin I (High Sensitivity) 4 <18 ng/L    Comment: (NOTE) Elevated high sensitivity troponin I (hsTnI) values and significant  changes across serial measurements may suggest ACS but many other  chronic and acute conditions are known to elevate hsTnI results.  Refer to the "Links" section for chest pain algorithms and additional  guidance. Performed at [facility], [place] [address] [place],  [address]   CBG monitoring, ED     Status: None   Collection Time: 02/22/21 11:32 AM  Result Value Ref Range   Glucose-Capillary 79 70 - 99 mg/dL    Comment: Glucose reference range applies only to samples taken after fasting for at least 8 hours.  I-Stat venous blood gas, ED     Status: Abnormal   Collection Time: 02/22/21 11:38 AM  Result Value Ref Range   pH, Ven 7.424 7.250 - 7.430   pCO2, Ven 40.9 (L) 44.0 - 60.0 mmHg   pO2, Ven 31.0 (LL) 32.0 - 45.0 mmHg   Bicarbonate  26.8 20.0 - 28.0 mmol/L   TCO2 28 22 - 32 mmol/L   O2 Saturation 60.0 %   Acid-Base Excess 2.0 0.0 - 2.0 mmol/L   Sodium 139 135 - 145 mmol/L   Potassium 4.3 3.5 - 5.1 mmol/L   Calcium, Ion 1.23 1.15 - 1.40 mmol/L   HCT 43.0 36.0 - 46.0 %   Hemoglobin 14.6 12.0 - 15.0 g/dL   Sample type VENOUS    Comment NOTIFIED PHYSICIAN   Resp Panel by RT-PCR (Flu A&B, Covid) Nasopharyngeal Swab     Status: None   Collection Time: 02/22/21  1:25 PM   Specimen: Nasopharyngeal Swab; Nasopharyngeal(NP) swabs in vial transport medium  Result Value Ref Range   SARS Coronavirus 2 by RT PCR NEGATIVE NEGATIVE    Comment: (NOTE) SARS-CoV-2 target nucleic acids are NOT DETECTED.  The SARS-CoV-2 RNA is generally detectable in upper respiratory specimens during the acute phase of infection. The lowest concentration of SARS-CoV-2 viral copies this assay can detect is 138 copies/mL. A negative result does not preclude SARS-Cov-2 infection and should not be used as the sole basis for  treatment or other patient management decisions. A negative result may occur with  improper specimen collection/handling, submission of specimen other than nasopharyngeal swab, presence of viral mutation(s) within the areas targeted by this assay, and inadequate number of viral copies(<138 copies/mL). A negative result must be combined with clinical observations, patient history, and epidemiological information. The expected result is Negative.  Fact Sheet for Patients:  EntrepreneurPulse.com.au  Fact Sheet for Healthcare Providers:  IncredibleEmployment.be  This test is no t yet approved or cleared by the [place] FDA and  has been authorized for detection and/or diagnosis of SARS-CoV-2 by FDA under an Emergency Use Authorization (EUA). This EUA will remain  in effect (meaning this test can be used) for the duration of the COVID-19 declaration under Section 564(b)(1) of the Act, 21 U.S.C.section 360bbb-3(b)(1), unless the authorization is terminated  or revoked sooner.       Influenza A by PCR NEGATIVE NEGATIVE   Influenza B by PCR NEGATIVE NEGATIVE    Comment: (NOTE) The Xpert Xpress SARS-CoV-2/FLU/RSV plus assay is intended as an aid in the diagnosis of influenza from Nasopharyngeal swab specimens and should not be used as a sole basis for treatment. Nasal washings and aspirates are unacceptable for Xpert Xpress SARS-CoV-2/FLU/RSV testing.  Fact Sheet for Patients: EntrepreneurPulse.com.au  Fact Sheet for Healthcare Providers: IncredibleEmployment.be  This test is not yet approved or cleared by the [place] FDA and has been authorized for detection and/or diagnosis of SARS-CoV-2 by FDA under an Emergency Use Authorization (EUA). This EUA will remain in effect (meaning this test can be used) for the duration of the COVID-19 declaration under Section 564(b)(1) of the Act, 21 U.S.C. section  360bbb-3(b)(1), unless the authorization is terminated or revoked.  Performed at [facility], [place] [address], [place], [place] [address]   Troponin I (High Sensitivity)     Status: None   Collection Time: 02/22/21  1:43 PM  Result Value Ref Range   Troponin I (High Sensitivity) 5 <18 ng/L    Comment: (NOTE) Elevated high sensitivity troponin I (hsTnI) values and significant  changes across serial measurements may suggest ACS but many other  chronic and acute conditions are known to elevate hsTnI results.  Refer to the "Links" section for chest pain algorithms and additional  guidance. Performed at [facility], [place] [address] [place], [place] [address]  Valproic acid level     Status: Abnormal   Collection Time: 02/22/21  1:43 PM  Result Value Ref Range   Valproic Acid Lvl 146 (H) 50.0 - 100.0 ug/mL    Comment: Performed at [facility] [address] [place], [place] [address]  Acetaminophen level     Status: Abnormal   Collection Time: 02/22/21  1:43 PM  Result Value Ref Range   Acetaminophen (Tylenol), Serum <10 (L) 10 - 30 ug/mL    Comment: (NOTE) Therapeutic concentrations vary significantly. A range of 10-30 ug/mL  may be an effective concentration for many patients. However, some  are best treated at concentrations outside of this range. Acetaminophen concentrations >150 ug/mL at 4 hours after ingestion  and >50 ug/mL at 12 hours after ingestion are often associated with  toxic reactions.  Performed at [facility], [place] [address], [place], [place] [address]   Salicylate level     Status: Abnormal   Collection Time: 02/22/21  1:43 PM  Result Value Ref Range   Salicylate Lvl <5.4 (L) 7.0 - 30.0 mg/dL    Comment: Performed at [facility] [address], [place], [place] [address]  Ammonia     Status: None   Collection Time: 02/22/21  2:06 PM  Result Value Ref Range   Ammonia 30 9 - 35 umol/L    Comment: Performed at [facility], [place] [address] [place], [place] [address]  TSH     Status: None   Collection Time: 02/23/21  4:22 AM  Result Value Ref Range   TSH 0.463 0.350 - 4.500 uIU/mL    Comment: Performed by a 3rd Generation assay with a functional sensitivity of <=0.01 uIU/mL. Performed at [facility], [place] [address], [place], [place] [address]   Vitamin B12     Status: None   Collection Time: 02/23/21  4:22 AM  Result Value Ref Range   Vitamin B-12 852 180 - 914 pg/mL    Comment: (NOTE) This assay is not validated for testing neonatal or myeloproliferative syndrome specimens for Vitamin B12 levels. Performed at [facility], [place] [address] [place], [place] [address]   Valproic acid level     Status: Abnormal   Collection Time: 02/23/21  4:22 AM  Result Value Ref Range   Valproic Acid Lvl 112 (H) 50.0 - 100.0 ug/mL    Comment: Performed at [facility] [address], [place], [place] [address]  CBC     Status: Abnormal   Collection Time: 02/23/21  4:22 AM  Result Value Ref Range   WBC 4.1 4.0 - 10.5 K/uL   RBC 5.21 (H) 3.87 - 5.11 MIL/uL   Hemoglobin 12.5 12.0 - 15.0 g/dL   HCT 39.6 36.0 - 46.0 %   MCV 76.0 (L) 80.0 - 100.0 fL   MCH 24.0 (L) 26.0 - 34.0 pg   MCHC 31.6 30.0 - 36.0 g/dL   RDW 16.4 (H) 11.5 - 15.5 %   Platelets 147 (L) 150 - 400 K/uL   nRBC 0.0 0.0 - 0.2 %    Comment: Performed at [facility], [place] [address], [place],  [address]  Basic metabolic panel     Status: None   Collection Time: 02/23/21  4:22 AM  Result Value Ref Range   Sodium 137 135 - 145 mmol/L   Potassium 4.3 3.5 - 5.1 mmol/L   Chloride 106 98 - 111  mmol/L   CO2 24 22 - 32 mmol/L   Glucose, Bld 76 70 - 99 mg/dL    Comment: Glucose reference range applies only to samples taken after fasting for at least 8 hours.   BUN 8 8 - 23 mg/dL   Creatinine, Ser 0.73 0.44 - 1.00 mg/dL   Calcium 9.1 8.9 - 10.3 mg/dL   GFR, Estimated >60 >60 mL/min    Comment: (NOTE) Calculated  using the CKD-EPI Creatinine Equation (2021)    Anion gap 7 5 - 15    Comment: Performed at [facility] [address] [place], [place] [address]  Salicylate level     Status: Abnormal   Collection Time: 02/23/21  4:22 AM  Result Value Ref Range   Salicylate Lvl <8.0 (L) 7.0 - 30.0 mg/dL    Comment: Performed at [facility] [address] [place], [place] [address]  Acetaminophen level     Status: Abnormal   Collection Time: 02/23/21  4:22 AM  Result Value Ref Range   Acetaminophen (Tylenol), Serum <10 (L) 10 - 30 ug/mL    Comment: (NOTE) Therapeutic concentrations vary significantly. A range of 10-30 ug/mL  may be an effective concentration for many patients. However, some  are best treated at concentrations outside of this range. Acetaminophen concentrations >150 ug/mL at 4 hours after ingestion  and >50 ug/mL at 12 hours after ingestion are often associated with  toxic reactions.  Performed at [facility], [place] [address], [place], [place] [address]     Current Facility-Administered Medications  Medication Dose Route Frequency Provider Last Rate Last Admin   enoxaparin (LOVENOX) injection 40 mg  40 mg Subcutaneous Q24H Orma Flaming, MD   40 mg at 02/23/21 1717   metoprolol tartrate (LOPRESSOR) tablet 12.5 mg  12.5 mg Oral BID Orma Flaming, MD   12.5 mg at 02/23/21 0913    Musculoskeletal: Strength & Muscle Tone:  not tested Gait & Station: unsteady Patient leans: N/A            Psychiatric Specialty Exam:  Presentation  General Appearance: Appropriate for Environment; Fairly Groomed  Eye Contact:Fair  Speech:Pressured; Normal Rate  Speech Volume:Decreased  Handedness:Right   Mood and Affect  Mood:Euthymic  Affect:Appropriate   Thought Process  Thought Processes:Goal Directed  Descriptions of Associations:Intact  Orientation:Full (Time, Place and Person)  Thought Content:Logical  History of  Schizophrenia/Schizoaffective disorder:Yes  Duration of Psychotic Symptoms:Greater than six months  Hallucinations:Hallucinations: None  Ideas of Reference:None  Suicidal Thoughts:Suicidal Thoughts: No  Homicidal Thoughts:Homicidal Thoughts: No   Sensorium  Memory:Immediate Fair; Recent Fair; Remote Fair  Judgment:Fair  Insight:Fair   Executive Functions  Concentration:Fair  Attention Span:Fair  [place]   Psychomotor Activity  Psychomotor Activity:Psychomotor Activity: Psychomotor Retardation   Assets  Assets:Desire for Improvement   Sleep  Sleep:Sleep: Fair   Physical Exam: Physical Exam ROS Blood pressure 134/82, pulse 62, temperature 98.1 F (36.7 C), temperature source Oral, resp. rate (!) 21, height 5\' 5"  (1.651 m), weight 71.1 kg, SpO2 99 %. Body mass index is 26.08 kg/m.  Treatment Plan Summary: 65 year old female with history of mental illness who was admitted with altered mental status like secondary to Valproic acid toxicity. Today, she alert, awake, oriented, cooperative and pleasant.  Recommendations: -Hold Depakote 500 mg twice daily but may re-start when Valproic acid level is less than 100 -Consider restarting Ingrezza 40 mg at bedtime and Zypreza 5 mg  at bedtime . -Consider social worker consult to evaluate patient social/home condition -Daily Valproic acid level until Depakote level is less than 100   Disposition: No evidence of imminent risk to self or others at present.   Patient does not meet criteria for psychiatric inpatient admission. Supportive therapy provided about ongoing stressors. Psychiatric service signing out. Re-consult as needed   Corena Pilgrim, MD 02/23/2021 10:20 PM

## 2021-02-24 DIAGNOSIS — T426X1A Poisoning by other antiepileptic and sedative-hypnotic drugs, accidental (unintentional), initial encounter: Secondary | ICD-10-CM

## 2021-02-24 LAB — CBC
HCT: 42.4 % (ref 36.0–46.0)
Hemoglobin: 13.3 g/dL (ref 12.0–15.0)
MCH: 23.9 pg — ABNORMAL LOW (ref 26.0–34.0)
MCHC: 31.4 g/dL (ref 30.0–36.0)
MCV: 76.3 fL — ABNORMAL LOW (ref 80.0–100.0)
Platelets: 155 10*3/uL (ref 150–400)
RBC: 5.56 MIL/uL — ABNORMAL HIGH (ref 3.87–5.11)
RDW: 15.9 % — ABNORMAL HIGH (ref 11.5–15.5)
WBC: 4.9 10*3/uL (ref 4.0–10.5)
nRBC: 0 % (ref 0.0–0.2)

## 2021-02-24 LAB — BASIC METABOLIC PANEL
Anion gap: 6 (ref 5–15)
BUN: 12 mg/dL (ref 8–23)
CO2: 22 mmol/L (ref 22–32)
Calcium: 10.2 mg/dL (ref 8.9–10.3)
Chloride: 107 mmol/L (ref 98–111)
Creatinine, Ser: 0.84 mg/dL (ref 0.44–1.00)
GFR, Estimated: >60 mL/min (ref >60)
Glucose, Bld: 95 mg/dL (ref 70–99)
Potassium: 4.4 mmol/L (ref 3.5–5.1)
Sodium: 135 mmol/L (ref 135–145)

## 2021-02-24 LAB — GLUCOSE, CAPILLARY
Glucose-Capillary: 96 mg/dL (ref 70–99)
Glucose-Capillary: 92 mg/dL (ref 70–99)
Glucose-Capillary: 110 mg/dL — ABNORMAL HIGH (ref 70–99)

## 2021-02-24 LAB — URINE CULTURE

## 2021-02-24 LAB — VALPROIC ACID LEVEL: Valproic Acid Lvl: 40 ug/mL — ABNORMAL LOW (ref 50.0–100.0)

## 2021-02-24 NOTE — Evaluation (Signed)
Physical Therapy Evaluation and Discharge Patient Details Name: Karen Best MRN: [id number] DOB: 04-05-1956 Today's Date: 02/24/2021  History of Present Illness  65 y.o. female Brought to the ED 02/22/21 with altered mental status and somnolence. Noted to have Valproic acid toxicity PMH of schizophrenia/bipolar disorder, CVA, HTN, tobacco abuse  Clinical Impression   Patient evaluated by Physical Therapy with no further acute PT needs identified. Patient reports independence in home with use of RW. Has aide 7 days/week and "landlord" that check on her. Patient appears to be at her baseline and agrees she does not need further PT services.  PT is signing off. Thank you for this referral.        Recommendations for follow up therapy are one component of a multi-disciplinary discharge planning process, led by the attending physician.  Recommendations may be updated based on patient status, additional functional criteria and insurance authorization.  Follow Up Recommendations No PT follow up    Assistance Recommended at Discharge Intermittent Supervision/Assistance  Functional Status Assessment Patient has not had a recent decline in their functional status  Equipment Recommendations  None recommended by PT    Recommendations for Other Services       Precautions / Restrictions Precautions Precautions: None      Mobility  Bed Mobility Overal bed mobility: Modified Independent                  Transfers Overall transfer level: Needs assistance Equipment used: None;Rolling walker (2 wheels) Transfers: Sit to/from Stand Sit to Stand: Modified independent (Device/Increase time)           General transfer comment: stood multiple times before PT had lines ready and had to request she return to sitting. no imbalance    Ambulation/Gait Ambulation/Gait assistance: Min guard Gait Distance (Feet): 100 Feet Assistive device: Rolling walker (2 wheels) Gait  Pattern/deviations: Step-through pattern;Drifts right/left   Gait velocity interpretation: 1.31 - 2.62 ft/sec, indicative of limited community ambulator General Gait Details: pt veering towards the left during ambulation and states it is due to faullty RW (Likely due to pt insisting on carrying cellphone in her left hand while also trying to hold RW). Pt able to self-correct. Reports she uses RW at all times at home  Stairs            Wheelchair Mobility    Modified Rankin (Stroke Patients Only)       Balance Overall balance assessment: Modified Independent (with RW)                                           Pertinent Vitals/Pain Pain Assessment: No/denies pain    Home Living Family/patient expects to be discharged to:: Private residence Living Arrangements: Alone Available Help at Discharge: Friend(s);Personal care attendant;Available PRN/intermittently Type of Home: House Home Access: Level entry (Level entry in the back)       Home Layout: One level Home Equipment: Conservation officer, nature (2 wheels);Shower seat      Prior Function Prior Level of Function : Needs assist       Physical Assist : ADLs (physical)   ADLs (physical): IADLs Mobility Comments: Walks ad lib in the home.  Generally does not go outside unless [place] or her PCA is with her. ADLs Comments: PCA completes cooking and cleaning, and supervison with showers.     Hand Dominance   Dominant Hand: Right  Extremity/Trunk Assessment   Upper Extremity Assessment Upper Extremity Assessment: Overall WFL for tasks assessed    Lower Extremity Assessment Lower Extremity Assessment: Overall WFL for tasks assessed    Cervical / Trunk Assessment Cervical / Trunk Assessment: Kyphotic  Communication   Communication: No difficulties  Cognition Arousal/Alertness: Awake/alert Behavior During Therapy: WFL for tasks assessed/performed Overall Cognitive Status: History of cognitive  impairments - at baseline                                 General Comments: states she accidentally took too much medicine and that's why she is hospitalized        General Comments General comments (skin integrity, edema, etc.): pt eager to go home today. TAkes converstation back to getting a ride home throughout session    Exercises     Assessment/Plan    PT Assessment Patient does not need any further PT services  PT Problem List         PT Treatment Interventions      PT Goals (Current goals can be found in the Care Plan section)  Acute Rehab PT Goals Patient Stated Goal: go home today PT Goal Formulation: All assessment and education complete, DC therapy    Frequency     Barriers to discharge        Co-evaluation               AM-PAC PT "6 Clicks" Mobility  Outcome Measure Help needed turning from your back to your side while in a flat bed without using bedrails?: None Help needed moving from lying on your back to sitting on the side of a flat bed without using bedrails?: None Help needed moving to and from a bed to a chair (including a wheelchair)?: None Help needed standing up from a chair using your arms (e.g., wheelchair or bedside chair)?: None Help needed to walk in hospital room?: A Little Help needed climbing 3-5 steps with a railing? : A Little 6 Click Score: 22    End of Session   Activity Tolerance: Patient tolerated treatment well Patient left: in chair;with call bell/phone within reach;with chair alarm set Nurse Communication: Mobility status PT Visit Diagnosis: Difficulty in walking, not elsewhere classified (R26.2)    Time: 3295-1884 PT Time Calculation (min) (ACUTE ONLY): 15 min   Charges:   PT Evaluation $PT Eval Low Complexity: [place], PT Acute Rehabilitation Services  Pager [phone] Office [phone]   Rexanne Mano 02/24/2021, 8:11 AM

## 2021-02-24 NOTE — Progress Notes (Signed)
PROGRESS NOTE    Karen Best  URK:[id number] DOB: 1955-05-29 DOA: 02/22/2021 PCP: [facility]   Chief Complaint  Patient presents with   Weakness   Brief Narrative/Hospital Course:  Karen Best, 65 y.o. female with PMH of schizophrenia/bipolar disorder, CVA, HTN, tobacco abuse  Brought to the ED with altered mental status and somnolence.She is checked on by her neighbor who noticed her to be more sleepy  02/21/21. Her aide came to see her on 10.28 and she called EMS because her "eyes looked funny." She was responsive, but slow-poor historian.  on admission friend Mr Vicente Serene provided history over the phone - and reprorted she has good days and bad days due to her mental illness. She was in a group home, but she didn't like it there and he got her out of the group home and since then she has been by herself and seems to be in and out of it. He denies any recent illness, but states she goes to ER at least once/month. She does give herself her own medication. She was recently started on new medication about 3 weeks ago. (Depakote, zyprexa and ingrezza) ED Course: vitals: Afebrile, blood pressure 114/87, heart rate 84, respiratory rate 12, oxygen 97% on room air. GCS on arrival was 11.  Pertinent labs: Valproic acid level 146, Chest x-ray: Low volumes patchy airspace opacities at the left lung base superimposed on chronic interstitial lung disease. CT head: No acute intracranial abnormality. In ED patient was bolused 1 L and started on maintenance IV fluids.  She was given Narcan.  Neurology was consulted and [facility] was asked to admit Seen by psychiatry and acute encephalopathy suspected due to valproic acid toxicity  Subjective: She is aaox3. No fever overnight. No complaints Now says she is here due to headache  Assessment & Plan:  Acute toxic encephalopathy/hypersomnolence/folic acid deficiency: No obvious evidence of infection in chest x-ray and  UA.TSH, G31, UDS, salicylate and Tylenol level are reassuring. Depakote level is supratherapeutic.  Mental status is now improving. ED had discussed with poison control as well as neurology felt less likely subclinical seizure or CVA.  Since patient is improving well continue current management -check daily valproic acid level until less than 100  THEN resume VALPROATE, back on Zyprexa and Ingrezza, appreciate psychiatry assistance.  PT OT eval and TOC consult for disposition.   Psych affective disorder/bipolar type poa: Plan as above.    History of CVA not on aspirin or statin Essential hypertension stable, cont Lopressor ? 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  DVT prophylaxis: enoxaparin (LOVENOX) injection 40 mg Start: 02/22/21 1645 Code Status:   Code Status: Full Code Family Communication: plan of care discussed with patient at bedside. Status is: Admitted observation, changed to inpatient due to acute toxic encephalopathy Remains hospitalized for ongoing management of her encephalopathy PT OT evaluation TOC consult, unsafe disposition. Lives in [place] Currently not medically stable.  Objective: Vitals last 24 hrs: Vitals:   02/23/21 1942 02/23/21 2225 02/24/21 0000 02/24/21 0420  BP:  (!) 160/82 131/88 140/80  Pulse:  63 68 60  Resp:   (!) 25   Temp: 98.1 F (36.7 C)   97.6 F (36.4 C)  TempSrc: Oral   Oral  SpO2:   97%   Weight:      Height:       Weight change:  Intake/Output Summary (Last 24 hours) at 02/24/2021 0916 Last data filed at 02/24/2021 0500 Gross per 24 hour  Intake 240 ml  Output 905 ml  Net -665 ml   Net IO Since Admission: -171.67 mL [02/24/21 0916]   Physical Examination: General exam: AAOx 3, older than stated age, weak appearing. HEENT:Oral mucosa moist, Ear/Nose WNL grossly, dentition normal. Respiratory system: bilaterally clear, no use of  accessory muscle Cardiovascular system: S1 & S2 +, No JVD,. Gastrointestinal system: Abdomen soft,NT,ND, BS+ Nervous System:Alert, awake, moving extremities and grossly nonfocal Extremities: No edema, distal peripheral pulses palpable.  Skin: No rashes,no icterus. MSK: Normal muscle bulk,tone, power   Medications reviewed:  Scheduled Meds:  enoxaparin (LOVENOX) injection  40 mg Subcutaneous Q24H   metoprolol tartrate  12.5 mg Oral BID   OLANZapine  5 mg Oral QHS   valbenazine  40 mg Oral QHS   Continuous Infusions:  Diet Order             Diet Heart Room service appropriate? Yes; Fluid consistency: Thin  Diet effective now                 Weight change:   Wt Readings from Last 3 Encounters:  02/22/21 71.1 kg  01/13/21 59 kg  12/02/20 66 kg     Consultants:see note  Procedures:see note Antimicrobials: Anti-infectives (From admission, onward)    None      Culture/Microbiology    Component Value Date/Time   SDES URINE, CLEAN CATCH 02/22/2021 2022   SPECREQUEST  02/22/2021 2022    NONE Performed at [facility] [address] [place], [place] [address]    CULT MULTIPLE SPECIES PRESENT, SUGGEST RECOLLECTION (A) 02/22/2021 2022   REPTSTATUS 02/24/2021 FINAL 02/22/2021 2022    Other culture-see note  Unresulted Labs (From admission, onward)     Start     Ordered   02/25/21 0500  Valproic acid level  Daily,   R      02/24/21 0750   02/24/21 0751  Valproic acid level  ONCE - STAT,   STAT        02/24/21 0750          Data Reviewed: I have personally reviewed following labs and imaging studies CBC: Recent Labs  Lab 02/22/21 1128 02/22/21 1138 02/23/21 0422 02/24/21 0200  WBC 6.8  --  4.1 4.9  NEUTROABS 2.8  --   --   --   HGB 13.6 14.6 12.5 13.3  HCT 43.7 43.0 39.6 42.4  MCV 76.7*  --  76.0* 76.3*  PLT 162  --  147* 453   Basic Metabolic Panel: Recent Labs  Lab 02/22/21 1128 02/22/21 1138 02/23/21 0422 02/24/21 0200  NA 138 139  137 135  K 4.2 4.3 4.3 4.4  CL 102  --  106 107  CO2 25  --  24 22  GLUCOSE 86  --  76 95  BUN 11  --  8 12  CREATININE 0.88  --  0.73 0.84  CALCIUM 10.2  --  9.1 10.2   GFR: Estimated Creatinine Clearance: 66.9 mL/min (by C-G formula based on SCr of 0.84 mg/dL). Liver Function Tests: Recent Labs  Lab 02/22/21 1128  AST 19  ALT 11  ALKPHOS 54  BILITOT 0.7  PROT 7.5  ALBUMIN 3.2*   No results for input(s): LIPASE, AMYLASE in the last 168 hours. Recent Labs  Lab 02/22/21 1406  AMMONIA 30   Coagulation Profile: Recent Labs  Lab  02/22/21 1128  INR 0.9   Cardiac Enzymes: Recent Labs  Lab 02/22/21 1128  CKTOTAL 28*   BNP (last 3 results) No results for input(s): PROBNP in the last 8760 hours. HbA1C: No results for input(s): HGBA1C in the last 72 hours. CBG: Recent Labs  Lab 02/22/21 1132 02/24/21 0849  GLUCAP 79 96   Lipid Profile: No results for input(s): CHOL, HDL, LDLCALC, TRIG, CHOLHDL, LDLDIRECT in the last 72 hours. Thyroid Function Tests: Recent Labs    02/23/21 0422  TSH 0.463   Anemia Panel: Recent Labs    02/23/21 0422  VITAMINB12 852   Sepsis Labs: No results for input(s): PROCALCITON, LATICACIDVEN in the last 168 hours.  Recent Results (from the past 240 hour(s))  Resp Panel by RT-PCR (Flu A&B, Covid) Nasopharyngeal Swab     Status: None   Collection Time: 02/22/21  1:25 PM   Specimen: Nasopharyngeal Swab; Nasopharyngeal(NP) swabs in vial transport medium  Result Value Ref Range Status   SARS Coronavirus 2 by RT PCR NEGATIVE NEGATIVE Final    Comment: (NOTE) SARS-CoV-2 target nucleic acids are NOT DETECTED.  The SARS-CoV-2 RNA is generally detectable in upper respiratory specimens during the acute phase of infection. The lowest concentration of SARS-CoV-2 viral copies this assay can detect is 138 copies/mL. A negative result does not preclude SARS-Cov-2 infection and should not be used as the sole basis for treatment or other  patient management decisions. A negative result may occur with  improper specimen collection/handling, submission of specimen other than nasopharyngeal swab, presence of viral mutation(s) within the areas targeted by this assay, and inadequate number of viral copies(<138 copies/mL). A negative result must be combined with clinical observations, patient history, and epidemiological information. The expected result is Negative.  Fact Sheet for Patients:  EntrepreneurPulse.com.au  Fact Sheet for Healthcare Providers:  IncredibleEmployment.be  This test is no t yet approved or cleared by the [place] FDA and  has been authorized for detection and/or diagnosis of SARS-CoV-2 by FDA under an Emergency Use Authorization (EUA). This EUA will remain  in effect (meaning this test can be used) for the duration of the COVID-19 declaration under Section 564(b)(1) of the Act, 21 U.S.C.section 360bbb-3(b)(1), unless the authorization is terminated  or revoked sooner.       Influenza A by PCR NEGATIVE NEGATIVE Final   Influenza B by PCR NEGATIVE NEGATIVE Final    Comment: (NOTE) The Xpert Xpress SARS-CoV-2/FLU/RSV plus assay is intended as an aid in the diagnosis of influenza from Nasopharyngeal swab specimens and should not be used as a sole basis for treatment. Nasal washings and aspirates are unacceptable for Xpert Xpress SARS-CoV-2/FLU/RSV testing.  Fact Sheet for Patients: EntrepreneurPulse.com.au  Fact Sheet for Healthcare Providers: IncredibleEmployment.be  This test is not yet approved or cleared by the [place] FDA and has been authorized for detection and/or diagnosis of SARS-CoV-2 by FDA under an Emergency Use Authorization (EUA). This EUA will remain in effect (meaning this test can be used) for the duration of the COVID-19 declaration under Section 564(b)(1) of the Act, 21 U.S.C. section  360bbb-3(b)(1), unless the authorization is terminated or revoked.  Performed at [facility], [place] [address], [place], [place] [address]   Urine Culture     Status: Abnormal   Collection Time: 02/22/21  8:22 PM   Specimen: Urine, Clean Catch  Result Value Ref Range Status   Specimen Description URINE, CLEAN CATCH  Final   Special Requests   Final  NONE Performed at [facility], [place] [address] [place], [place] [address]    Culture MULTIPLE SPECIES PRESENT, SUGGEST RECOLLECTION (A)  Final   Report Status 02/24/2021 FINAL  Final     Radiology Studies: CT HEAD WO CONTRAST  Result Date: 02/22/2021 CLINICAL DATA:  Weakness and altered mental status. EXAM: CT HEAD WITHOUT CONTRAST TECHNIQUE: Contiguous axial images were obtained from the base of the skull through the vertex without intravenous contrast. COMPARISON:  CT head dated November 07, 2020. FINDINGS: Brain: No evidence of acute infarction, hemorrhage, hydrocephalus, extra-axial collection or mass lesion/mass effect. Vascular: No hyperdense vessel or unexpected calcification. Skull: Normal. Negative for fracture or focal lesion. Sinuses/Orbits: No acute finding. Other: None. IMPRESSION: 1. No acute intracranial abnormality. Electronically Signed   By: Titus Dubin M.D.   On: 02/22/2021 12:31   DG Chest Port 1 View  Result Date: 02/22/2021 CLINICAL DATA:  worsening fatigue over the last few weeks,hx asthma,htn EXAM: PORTABLE CHEST - 1 VIEW COMPARISON:  11/07/2020 and previous FINDINGS: Relatively low lung volumes. Chronic coarse interstitial opacities, with some increase in airspace disease at the left lung base. Heart size and mediastinal contours are within normal limits. Aortic Atherosclerosis (ICD10-170.0). No effusion. Visualized bones unremarkable. IMPRESSION: Low volumes. Patchy airspace opacities at the left lung base superimposed on chronic interstitial lung disease. Electronically Signed   By: Lucrezia Europe M.D.    On: 02/22/2021 11:53     LOS: 1 day   Antonieta Pert, MD [facility]  02/24/2021, 9:16 AM

## 2021-02-25 DIAGNOSIS — R400 Somnolence: Secondary | ICD-10-CM

## 2021-02-25 DIAGNOSIS — Z79899 Other long term (current) drug therapy: Secondary | ICD-10-CM | POA: Diagnosis not present

## 2021-02-25 DIAGNOSIS — I10 Essential (primary) hypertension: Secondary | ICD-10-CM

## 2021-02-25 LAB — VALPROIC ACID LEVEL: Valproic Acid Lvl: 20 ug/mL — ABNORMAL LOW (ref 50.0–100.0)

## 2021-02-25 MED ORDER — OLANZAPINE 5 MG PO TABS: 5.0000 mg | ORAL_TABLET | Freq: Every day | ORAL | 3 refills | Status: DC

## 2021-02-25 MED ORDER — DIVALPROEX SODIUM 500 MG PO DR TAB
500.0000 mg | DELAYED_RELEASE_TABLET | Freq: Two times a day (BID) | ORAL | Status: DC
  Administered 2021-02-25: 500 mg via ORAL
  Filled 2021-02-25: qty 1

## 2021-02-25 MED ORDER — DIVALPROEX SODIUM 500 MG PO DR TAB: 500.0000 mg | DELAYED_RELEASE_TABLET | Freq: Two times a day (BID) | ORAL | 2 refills | Status: DC

## 2021-02-25 MED ORDER — VALBENAZINE TOSYLATE 40 MG PO CAPS: 40.0000 mg | ORAL_CAPSULE | Freq: Every day | ORAL | 2 refills | Status: DC

## 2021-02-25 NOTE — Plan of Care (Signed)

## 2021-02-25 NOTE — Discharge Summary (Addendum)
Physician Discharge Summary  Karen Best WEX:[id number] DOB: 21-Sep-1955 DOA: 02/22/2021  PCP: [facility]  Admit date: 02/22/2021 Discharge date: 02/25/2021  Time spent: 60 minutes  Recommendations for Outpatient Follow-up:  Follow-up infectious disease clinic, Dr. 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  They will call for appointment as outpatient.   Discharge Diagnoses:  Principal Problem:   Valproic acid toxicity Active Problems:   CVA (cerebral vascular accident) ([place])   Schizoaffective disorder, bipolar type ([place])   Acute encephalopathy   Hypertension   Discharge Condition: Stable  Diet recommendation: Heart healthy diet  Filed Weights   02/22/21 1830  Weight: 71.1 kg    History of present illness:  65 year old female with past medical history of schizophrenia/bipolar disorder, CVA, hypertension, tobacco abuse was brought to ED with altered mental status and somnolence.  She was noticed to be more sleepy than usual on 02/21/2021 by her neighbor.  Her aide came to see her on 10/28 and thought that her eyes looked funny.  She was responsive but had very slow response.  She was recently started on new medications 3 weeks ago Depakote, Zyprexa, Ingrezza.  Hospital Course:   Acute toxic encephalopathy/hypersomnolence -Resolved -Chest x-ray negative, UA negative; urine culture grew multiple species -TSH, B12 level, urine drug screen, salicylate and Tylenol level are reassuring -Valproic acid level was found to be supratherapeutic; Depakote was held.  Psychiatry was consulted, recommended to continue with Ingrezza 40 mg daily, Zyprexa 5 mg daily and Depakote 500 mg p.o. twice daily -Patient is now back to baseline.  Valproic acid level is 20. -Depakote 500 mg p.o. twice daily has been restarted -Continue clonazepam 0.5 mg p.o. 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  I called and discussed with ID, Dr. Drucilla Schmidt, he will see patient in the clinic.  ID clinic will call with date and time for appointment.  Hypertension -Blood pressure is stable, continue Lopressor 12.5 mg p.o. twice daily.  Schizoaffective disorder -Medication changes as above  History of CVA -Not on aspirin or statin.   Procedures:   Consultations: Psychiatry  Discharge Exam: Vitals:   02/25/21 0742 02/25/21 1134  BP: 133/74 111/69  Pulse: 64 60  Resp: 11 14  Temp: 99 F (37.2 C) 97.8 F (36.6 C)  SpO2: 96% 94%    General: Appears in no acute distress Cardiovascular: S1-S2, regular, no murmur auscultated Respiratory: Clear to auscultation bilaterally  Discharge Instructions   Discharge Instructions     Diet - low sodium heart healthy   Complete by: As directed    Increase activity slowly   Complete by: As directed       Allergies as of 02/25/2021       Reactions   Haloperidol And Related    Extrapyramidal symptoms        Medication List     STOP taking these medications    cephALEXin 500 MG capsule Commonly known as: Keflex   QUEtiapine 100 MG tablet Commonly known as: SEROQUEL   traZODone 50 MG tablet Commonly known as: DESYREL       TAKE these medications    clonazePAM 0.5 MG tablet Commonly known as: KLONOPIN Take 0.5 mg by mouth 2 (two) times daily as needed for anxiety.  divalproex 500 MG DR tablet Commonly known as: DEPAKOTE Take 1 tablet (500 mg total) by mouth every 12 (twelve) hours.   melatonin 5 MG Tabs Take 1 tablet (5 mg total) by mouth at bedtime.   metoprolol tartrate 25 MG tablet Commonly known as: LOPRESSOR Take 0.5 tablets (12.5 mg total) by mouth 2 (two) times daily.   OLANZapine 5 MG tablet Commonly known as: ZYPREXA Take 1 tablet (5 mg total) by mouth at bedtime. What  changed:  when to take this Another medication with the same name was removed. Continue taking this medication, and follow the directions you see here.   valbenazine 40 MG capsule Commonly known as: Ingrezza Take 1 capsule (40 mg total) by mouth at bedtime.       Allergies  Allergen Reactions   Haloperidol And Related     Extrapyramidal symptoms      The results of significant diagnostics from this hospitalization (including imaging, microbiology, ancillary and laboratory) are listed below for reference.    Significant Diagnostic Studies: CT HEAD WO CONTRAST  Result Date: 02/22/2021 CLINICAL DATA:  Weakness and altered mental status. EXAM: CT HEAD WITHOUT CONTRAST TECHNIQUE: Contiguous axial images were obtained from the base of the skull through the vertex without intravenous contrast. COMPARISON:  CT head dated November 07, 2020. FINDINGS: Brain: No evidence of acute infarction, hemorrhage, hydrocephalus, extra-axial collection or mass lesion/mass effect. Vascular: No hyperdense vessel or unexpected calcification. Skull: Normal. Negative for fracture or focal lesion. Sinuses/Orbits: No acute finding. Other: None. IMPRESSION: 1. No acute intracranial abnormality. Electronically Signed   By: Titus Dubin M.D.   On: 02/22/2021 12:31   US Abdomen Complete  Result Date: 02/01/2021 CLINICAL DATA:  65 year old female with nausea and vomiting. EXAM: ABDOMEN ULTRASOUND COMPLETE COMPARISON:  CT Abdomen and Pelvis 07/18/2019. Renal ultrasound 11/08/2020. FINDINGS: Gallbladder: Chronic cholelithiasis, individually up to 10 mm. Gallbladder wall thickness remains within normal limits. No pericholecystic fluid. Contracted gallbladder with no sonographic Murphy sign elicited. Common bile duct: Diameter: 4 mm, normal. Liver: No focal lesion identified. Within normal limits in parenchymal echogenicity. Portal vein is patent on color Doppler imaging with normal direction of blood flow towards the liver.  IVC: No abnormality visualized. Pancreas: Pancreatic duct at the upper limits of normal. Otherwise visualized portion unremarkable. Spleen: Size and appearance within normal limits. Right Kidney: Length: 9.6 cm. Echogenicity within normal limits. No mass or hydronephrosis visualized. Left Kidney: Length: 10.5 cm. Echogenicity within normal limits. No mass or hydronephrosis visualized. Abdominal aorta: No aneurysm visualized. Other findings: None. IMPRESSION: 1. Chronic cholelithiasis. No evidence of acute cholecystitis or bile duct obstruction. 2. Otherwise negative ultrasound appearance of the abdomen. Electronically Signed   By: Genevie Ann M.D.   On: 02/01/2021 08:50   DG Chest Port 1 View  Result Date: 02/22/2021 CLINICAL DATA:  worsening fatigue over the last few weeks,hx asthma,htn EXAM: PORTABLE CHEST - 1 VIEW COMPARISON:  11/07/2020 and previous FINDINGS: Relatively low lung volumes. Chronic coarse interstitial opacities, with some increase in airspace disease at the left lung base. Heart size and mediastinal contours are within normal limits. Aortic Atherosclerosis (ICD10-170.0). No effusion. Visualized bones unremarkable. IMPRESSION: Low volumes. Patchy airspace opacities at the left lung base superimposed on chronic interstitial lung disease. Electronically Signed   By: Lucrezia Europe M.D.   On: 02/22/2021 11:53    Microbiology: Recent Results (from the past 240 hour(s))  Resp Panel by RT-PCR (Flu A&B, Covid) Nasopharyngeal Swab     Status: None  Collection Time: 02/22/21  1:25 PM   Specimen: Nasopharyngeal Swab; Nasopharyngeal(NP) swabs in vial transport medium  Result Value Ref Range Status   SARS Coronavirus 2 by RT PCR NEGATIVE NEGATIVE Final    Comment: (NOTE) SARS-CoV-2 target nucleic acids are NOT DETECTED.  The SARS-CoV-2 RNA is generally detectable in upper respiratory specimens during the acute phase of infection. The lowest concentration of SARS-CoV-2 viral copies this assay can  detect is 138 copies/mL. A negative result does not preclude SARS-Cov-2 infection and should not be used as the sole basis for treatment or other patient management decisions. A negative result may occur with  improper specimen collection/handling, submission of specimen other than nasopharyngeal swab, presence of viral mutation(s) within the areas targeted by this assay, and inadequate number of viral copies(<138 copies/mL). A negative result must be combined with clinical observations, patient history, and epidemiological information. The expected result is Negative.  Fact Sheet for Patients:  EntrepreneurPulse.com.au  Fact Sheet for Healthcare Providers:  IncredibleEmployment.be  This test is no t yet approved or cleared by the [place] FDA and  has been authorized for detection and/or diagnosis of SARS-CoV-2 by FDA under an Emergency Use Authorization (EUA). This EUA will remain  in effect (meaning this test can be used) for the duration of the COVID-19 declaration under Section 564(b)(1) of the Act, 21 U.S.C.section 360bbb-3(b)(1), unless the authorization is terminated  or revoked sooner.       Influenza A by PCR NEGATIVE NEGATIVE Final   Influenza B by PCR NEGATIVE NEGATIVE Final    Comment: (NOTE) The Xpert Xpress SARS-CoV-2/FLU/RSV plus assay is intended as an aid in the diagnosis of influenza from Nasopharyngeal swab specimens and should not be used as a sole basis for treatment. Nasal washings and aspirates are unacceptable for Xpert Xpress SARS-CoV-2/FLU/RSV testing.  Fact Sheet for Patients: EntrepreneurPulse.com.au  Fact Sheet for Healthcare Providers: IncredibleEmployment.be  This test is not yet approved or cleared by the [place] FDA and has been authorized for detection and/or diagnosis of SARS-CoV-2 by FDA under an Emergency Use Authorization (EUA). This EUA will remain in  effect (meaning this test can be used) for the duration of the COVID-19 declaration under Section 564(b)(1) of the Act, 21 U.S.C. section 360bbb-3(b)(1), unless the authorization is terminated or revoked.  Performed at [facility], [place] [address] [place], [place] [address]   Urine Culture     Status: Abnormal   Collection Time: 02/22/21  8:22 PM   Specimen: Urine, Clean Catch  Result Value Ref Range Status   Specimen Description URINE, CLEAN CATCH  Final   Special Requests   Final    NONE Performed at [facility], [place] [address] [place], [place] [address]    Culture MULTIPLE SPECIES PRESENT, SUGGEST RECOLLECTION (A)  Final   Report Status 02/24/2021 FINAL  Final     Labs: Basic Metabolic Panel: Recent Labs  Lab 02/22/21 1128 02/22/21 1138 02/23/21 0422 02/24/21 0200  NA 138 139 137 135  K 4.2 4.3 4.3 4.4  CL 102  --  106 107  CO2 25  --  24 22  GLUCOSE 86  --  76 95  BUN 11  --  8 12  CREATININE 0.88  --  0.73 0.84  CALCIUM 10.2  --  9.1 10.2   Liver Function Tests: Recent Labs  Lab 02/22/21 1128  AST 19  ALT 11  ALKPHOS 54  BILITOT 0.7  PROT 7.5  ALBUMIN 3.2*  No results for input(s): LIPASE, AMYLASE in the last 168 hours. Recent Labs  Lab 02/22/21 1406  AMMONIA 30   CBC: Recent Labs  Lab 02/22/21 1128 02/22/21 1138 02/23/21 0422 02/24/21 0200  WBC 6.8  --  4.1 4.9  NEUTROABS 2.8  --   --   --   HGB 13.6 14.6 12.5 13.3  HCT 43.7 43.0 39.6 42.4  MCV 76.7*  --  76.0* 76.3*  PLT 162  --  147* 155   Cardiac Enzymes: Recent Labs  Lab 02/22/21 1128  CKTOTAL 28*   BNP: BNP (last 3 results) No results for input(s): BNP in the last 8760 hours.  ProBNP (last 3 results) No results for input(s): PROBNP in the last 8760 hours.  CBG: Recent Labs  Lab 02/22/21 1132 02/24/21 0849 02/24/21 1243 02/24/21 1629  GLUCAP 79 96 92 110*       Signed:  Oswald Hillock MD.  [facility] 02/25/2021, 12:41 PM

## 2021-02-25 NOTE — TOC Transition Note (Signed)
Transition of Care [facility]) - CM/SW Discharge Note   Patient Details  Name: Karen Best MRN: [id number] Date of Birth: Jul 28, 1955  [facility]) CM/SW Contact:  Joanne Chars, LCSW Phone Number: 02/25/2021, 1:14 PM   Clinical Narrative:  Pt discharging home to self care.  [facility] continues to provide [facility] aide services.  [facility] will transport home.  No other needs identified.      Final next level of care: Home/Self Care Barriers to Discharge: Barriers Resolved   Patient Goals and CMS Choice Patient states their goals for this hospitalization and ongoing recovery are:: "get back to living my life" CMS Medicare.gov Compare Post Acute Care list provided to::  (NA)    Discharge Placement                       Discharge Plan and Services In-house Referral: Clinical Social Work   Post Acute Care Choice: NA          DME Arranged: N/A           HH Agency: NA        Social Determinants of Health (SDOH) Interventions     Readmission Risk Interventions Readmission Risk Prevention Plan 02/25/2021 09/25/2020  Transportation Screening Complete Complete  PCP or Specialist Appt within 5-7 Days - Complete  PCP or Specialist Appt within 3-5 Days Not Complete -  Not Complete comments Three attempts made to call [facility]--phone rings and rings but no answer, cannot leave mesage. -  Home Care Screening - Complete  Medication Review (RN CM) - Complete  HRI or Home Care Consult Complete -  Social Work Consult for Recovery Care Planning/Counseling Complete -  Palliative Care Screening Not Applicable -  Some recent data might be hidden

## 2021-02-25 NOTE — TOC Initial Note (Addendum)
Transition of Care [facility]) - Initial/Assessment Note    Patient Details  Name: Karen Best MRN: [id number] Date of Birth: 11/08/1955  [facility]) CM/SW Contact:    Joanne Chars, LCSW Phone Number: 02/25/2021, 11:05 AM  Clinical Narrative:   CSW met with pt for initial assessment.  Pt reports she lives in a boarding house.  Her landlord is Fredonia Highland and he checks in on her and assists.  He will be picking pt up from the hospital.  Permission given to speak with Fredonia Highland.  Pt also reports she has [place] aide in place through [facility] 7 days a week, 2-3 hours per day.  Discussed no recommendation for PT follow up.  Current DME in home: walker, 3n1.  1250: CSW spoke to [place] with [facility] care, informed her of pt discharge today.  [place] aide will resume tomorrow.    Three attempts made to contact [facility] for hospital discharge appt.  Disconnected once, phone rings and rings with no answer/no voicemail twice.                  Expected Discharge Plan: Home/Self Care Barriers to Discharge: Continued Medical Work up   Patient Goals and CMS Choice Patient states their goals for this hospitalization and ongoing recovery are:: "get back to living my life" CMS Medicare.gov Compare Post Acute Care list provided to::  (NA)    Expected Discharge Plan and Services Expected Discharge Plan: Home/Self Care In-house Referral: Clinical Social Work   Post Acute Care Choice: NA Living arrangements for the past 2 months: [place]                                      Prior Living Arrangements/Services Living arrangements for the past 2 months: [place] with:: Self Patient language and need for interpreter reviewed:: Yes Do you feel safe going back to the place where you live?: Yes      Need for Family Participation in Patient Care: No (Comment) Care giver support system in place?: Yes (comment) Current home services:  Homehealth aide (through [facility]) Criminal Activity/Legal Involvement Pertinent to Current Situation/Hospitalization: No - Comment as needed  Activities of Daily Living Home Assistive Devices/Equipment: Environmental consultant (specify type) ADL Screening (condition at time of admission) Patient's cognitive ability adequate to safely complete daily activities?: No Is the patient deaf or have difficulty hearing?: No Does the patient have difficulty seeing, even when wearing glasses/contacts?: No Does the patient have difficulty concentrating, remembering, or making decisions?: Yes Patient able to express need for assistance with ADLs?: Yes Does the patient have difficulty dressing or bathing?: Yes Independently performs ADLs?: No Communication: Independent Dressing (OT): Needs assistance Is this a change from baseline?: Pre-admission baseline Grooming: Needs assistance Is this a change from baseline?: Pre-admission baseline Feeding: Independent Bathing: Needs assistance Is this a change from baseline?: Pre-admission baseline Toileting: Needs assistance Is this a change from baseline?: Pre-admission baseline In/Out Bed: Needs assistance Is this a change from baseline?: Pre-admission baseline Walks in Home: Independent with device (comment) (walker) Does the patient have difficulty walking or climbing stairs?: Yes Weakness of Legs: Both Weakness of Arms/Hands: Both  Permission Sought/Granted Permission sought to share information with : Other (comment) Permission granted to share information with : Yes, Verbal Permission Granted  Share Information with NAME: landlord, Fredonia Highland  Emotional Assessment Appearance:: Appears stated age Attitude/Demeanor/Rapport: Engaged Affect (typically observed): Appropriate, Pleasant Orientation: : Oriented to Self, Oriented to Place, Oriented to  Time, Oriented to Situation Alcohol / Substance Use: Not Applicable Psych Involvement: No  (comment)  Admission diagnosis:  Somnolence [R40.0] Polypharmacy [Z79.899] Acute encephalopathy [G93.40] Patient Active Problem List   Diagnosis Date Noted   Valproic acid toxicity 02/22/2021   Malnutrition of moderate degree 11/10/2020   Hypertension    Acute encephalopathy 09/18/2020   Schizoaffective disorder, bipolar type ([place]) 05/13/2020   Bacteremia due to Staphylococcus aureus 10/03/2019   AKI (acute kidney injury) ([place]) 10/02/2019   Transaminitis 10/02/2019   CVA (cerebral vascular accident) ([place]) 10/02/2019   AMS (altered mental status) 10/01/2019   Lithium toxicity 02/16/2019   Hyperkalemia 02/16/2019   Acute confusion 51/89/8421   Toxic metabolic encephalopathy 07/07/8116   Asthma 12/23/2018   Delirium due to another medical condition    Acute metabolic encephalopathy 86/77/3736   Positive hepatitis C antibody test 06/11/2018   PCP:  [facility] Pharmacy:   [id number] [address] [place] [address] Phone: [phone] Fax: [place] [facility] [place] [address] [place] [address] Phone: [phone] Fax: [phone]     Social Determinants of Health (SDOH) Interventions    Readmission Risk Interventions Readmission Risk Prevention Plan 09/25/2020  Transportation Screening Complete  PCP or Specialist Appt within 5-7 Days Complete  Home Care Screening Complete  Medication Review (RN CM) Complete  Some recent data might be hidden

## 2021-03-02 ENCOUNTER — Other Ambulatory Visit: Payer: Self-pay

## 2021-03-02 ENCOUNTER — Emergency Department (HOSPITAL_COMMUNITY)
Admission: EM | Admit: 2021-03-02 | Discharge: 2021-03-02 | Disposition: A | Discharge status: Home / Self Care | Payer: Medicaid Other | Attending: Emergency Medicine | Admitting: Emergency Medicine

## 2021-03-02 DIAGNOSIS — J45909 Unspecified asthma, uncomplicated: Secondary | ICD-10-CM | POA: Insufficient documentation

## 2021-03-02 DIAGNOSIS — Z79899 Other long term (current) drug therapy: Secondary | ICD-10-CM | POA: Diagnosis not present

## 2021-03-02 DIAGNOSIS — I10 Essential (primary) hypertension: Secondary | ICD-10-CM | POA: Diagnosis not present

## 2021-03-02 DIAGNOSIS — Z022 Encounter for examination for admission to residential institution: Secondary | ICD-10-CM | POA: Diagnosis present

## 2021-03-02 DIAGNOSIS — F17210 Nicotine dependence, cigarettes, uncomplicated: Secondary | ICD-10-CM | POA: Insufficient documentation
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
## 2021-03-02 NOTE — ED Triage Notes (Signed)
BIB EMS from [facility]. Pt complaining of blurred vision and fatigue due to lack of sleep. Pt afraid someone will take her things.

## 2021-03-02 NOTE — Progress Notes (Signed)
CSW met with Pt at bedside to update on progress on case. Pt states that she is tired of paying weekly rent at boarding home. CSW offered prices for assisted living for comparison. Pt states that she does not care for the person who assists her at the boarding house.  CSW reminded Pt that she has the right to request a change of personal through the agency.  CSW informed Pt that intake process for [facility] had been initiated and that [facility] would be calling Pt's phone and that Pt had the option of working on shelter placement or returning to boarding house. Pt stated that she wished to return to boarding house.  CSW updated floor RN of Pt's choice and provided a taxi voucher for d/c transport.
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
## 2021-03-02 NOTE — Discharge Instructions (Signed)
Follow up your family doc and Education officer, museum.  Work with them to see if you can get into an assisted living facility.

## 2021-03-11 ENCOUNTER — Ambulatory Visit: Payer: Medicaid Other | Admitting: Infectious Disease

## 2021-03-26 ENCOUNTER — Emergency Department (HOSPITAL_COMMUNITY)
Admission: EM | Admit: 2021-03-26 | Discharge: 2021-04-11 | Disposition: A | Discharge status: Home / Self Care | Payer: Medicaid Other | Attending: Emergency Medicine | Admitting: Emergency Medicine

## 2021-03-26 DIAGNOSIS — F17210 Nicotine dependence, cigarettes, uncomplicated: Secondary | ICD-10-CM | POA: Insufficient documentation

## 2021-03-26 DIAGNOSIS — J45909 Unspecified asthma, uncomplicated: Secondary | ICD-10-CM | POA: Insufficient documentation

## 2021-03-26 DIAGNOSIS — Z79899 Other long term (current) drug therapy: Secondary | ICD-10-CM | POA: Insufficient documentation

## 2021-03-26 DIAGNOSIS — I10 Essential (primary) hypertension: Secondary | ICD-10-CM | POA: Diagnosis not present

## 2021-03-26 DIAGNOSIS — F259 Schizoaffective disorder, unspecified: Secondary | ICD-10-CM | POA: Diagnosis present

## 2021-03-26 DIAGNOSIS — Z20822 Contact with and (suspected) exposure to covid-19: Secondary | ICD-10-CM | POA: Insufficient documentation

## 2021-03-26 DIAGNOSIS — Z5900 Homelessness unspecified: Secondary | ICD-10-CM | POA: Diagnosis not present

## 2021-03-26 DIAGNOSIS — Y900 Blood alcohol level of less than 20 mg/100 ml: Secondary | ICD-10-CM | POA: Diagnosis not present

## 2021-03-26 LAB — COMPREHENSIVE METABOLIC PANEL
ALT: 20 U/L (ref 0–44)
AST: 28 U/L (ref 15–41)
Albumin: 4.3 g/dL (ref 3.5–5.0)
Alkaline Phosphatase: 60 U/L (ref 38–126)
Anion gap: 15 (ref 5–15)
BUN: 14 mg/dL (ref 8–23)
CO2: 20 mmol/L — ABNORMAL LOW (ref 22–32)
Calcium: 10.9 mg/dL — ABNORMAL HIGH (ref 8.9–10.3)
Chloride: 101 mmol/L (ref 98–111)
Creatinine, Ser: 0.92 mg/dL (ref 0.44–1.00)
GFR, Estimated: >60 mL/min (ref >60)
Glucose, Bld: 104 mg/dL — ABNORMAL HIGH (ref 70–99)
Potassium: 3.2 mmol/L — ABNORMAL LOW (ref 3.5–5.1)
Sodium: 136 mmol/L (ref 135–145)
Total Bilirubin: 1.1 mg/dL (ref 0.3–1.2)
Total Protein: 9.1 g/dL — ABNORMAL HIGH (ref 6.5–8.1)

## 2021-03-26 LAB — CBC WITH DIFFERENTIAL/PLATELET
Abs Immature Granulocytes: 0.01 10*3/uL (ref 0.00–0.07)
Basophils Absolute: 0 10*3/uL (ref 0.0–0.1)
Basophils Relative: 0 %
Eosinophils Absolute: 0.1 10*3/uL (ref 0.0–0.5)
Eosinophils Relative: 3 %
HCT: 49.5 % — ABNORMAL HIGH (ref 36.0–46.0)
Hemoglobin: 15.4 g/dL — ABNORMAL HIGH (ref 12.0–15.0)
Immature Granulocytes: 0 %
Lymphocytes Relative: 47 %
Lymphs Abs: 2.2 10*3/uL (ref 0.7–4.0)
MCH: 23.3 pg — ABNORMAL LOW (ref 26.0–34.0)
MCHC: 31.1 g/dL (ref 30.0–36.0)
MCV: 75 fL — ABNORMAL LOW (ref 80.0–100.0)
Monocytes Absolute: 0.5 10*3/uL (ref 0.1–1.0)
Monocytes Relative: 10 %
Neutro Abs: 1.9 10*3/uL (ref 1.7–7.7)
Neutrophils Relative %: 40 %
Platelets: 169 10*3/uL (ref 150–400)
RBC: 6.6 MIL/uL — ABNORMAL HIGH (ref 3.87–5.11)
RDW: 17.3 % — ABNORMAL HIGH (ref 11.5–15.5)
WBC: 4.8 10*3/uL (ref 4.0–10.5)
nRBC: 0 % (ref 0.0–0.2)

## 2021-03-26 LAB — HIV ANTIBODY (ROUTINE TESTING W REFLEX): HIV Screen 4th Generation wRfx: NONREACTIVE

## 2021-03-26 LAB — SALICYLATE LEVEL: Salicylate Lvl: <7 mg/dL — ABNORMAL LOW (ref 7.0–30.0)

## 2021-03-26 LAB — ACETAMINOPHEN LEVEL: Acetaminophen (Tylenol), Serum: <10 ug/mL — ABNORMAL LOW (ref 10–30)

## 2021-03-26 LAB — VALPROIC ACID LEVEL: Valproic Acid Lvl: <10 ug/mL — ABNORMAL LOW (ref 50.0–100.0)

## 2021-03-26 LAB — ETHANOL: Alcohol, Ethyl (B): <10 mg/dL (ref <10)

## 2021-03-26 MED ORDER — POTASSIUM CHLORIDE CRYS ER 20 MEQ PO TBCR
40.0000 meq | EXTENDED_RELEASE_TABLET | Freq: Once | ORAL | Status: AC
  Administered 2021-03-26: 40 meq via ORAL

## 2021-03-26 MED ORDER — OLANZAPINE 5 MG PO TABS
5.0000 mg | ORAL_TABLET | Freq: Every day | ORAL | Status: DC
  Administered 2021-03-27 – 2021-04-10 (×15): 5 mg via ORAL
  Filled 2021-03-26 (×17): qty 1

## 2021-03-26 MED ORDER — LORAZEPAM 2 MG/ML IJ SOLN
1.0000 mg | Freq: Once | INTRAMUSCULAR | Status: AC
  Administered 2021-03-26: 1 mg via INTRAMUSCULAR

## 2021-03-26 MED ORDER — VALBENAZINE TOSYLATE 40 MG PO CAPS
40.0000 mg | ORAL_CAPSULE | Freq: Every day | ORAL | Status: DC
  Administered 2021-03-28 – 2021-04-10 (×14): 40 mg via ORAL
  Filled 2021-03-26 (×17): qty 1

## 2021-03-26 MED ORDER — DIVALPROEX SODIUM 500 MG PO DR TAB
500.0000 mg | DELAYED_RELEASE_TABLET | Freq: Two times a day (BID) | ORAL | Status: DC
  Administered 2021-03-27 – 2021-04-11 (×30): 500 mg via ORAL
  Filled 2021-03-26 (×32): qty 1
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
## 2021-03-26 NOTE — BH Assessment (Addendum)
TTS assessment pending:  @ 1153, requested patient's nurse Zacarias Pontes, RN) to set up the TTS machine for patient to be seen.   @1205 , Received notification from [facility] staff, [place], EMT, that patient would need to roomed for privacy. Daylan to notify TTS when patient is ready.    @ 2000, Clinician followed back up with Ronny Bacon, RN, checking to see if patient is ready for her TTS assessment and placed in a private room @ 2032 [place], [place], states that patient is being moved to a room for her TTS assessment. Clinician then notified that the TTS machine is in use. Requesting nursing to notify TTS when patient is ready to be seen.

## 2021-03-26 NOTE — ED Notes (Signed)
Pt ate almost all of her lunch tray
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
## 2021-03-26 NOTE — Progress Notes (Signed)
.  Transition of Care [facility]) - Emergency Department Mini Assessment   Patient Details  Name: Karen Best MRN: [id number] Date of Birth: 11-02-1955  [facility]) CM/SW Contact:    Illene Regulus, LCSW Phone Number: 03/26/2021, 1:15 PM   Clinical Narrative: CSW spoke with pt, at bedside , pt was unable to described how she got to [facility]. According to pt's chart pt was dropped off by unknown person only wearing a shirt. When asked about where she was before she stated "Motel". Pt was unable to tell CSW which motel she came from. When asked about her plans for d/c pt kept repeating " im going to [place]" pt did not elaborate on statement . Pt stated "My children will be taking me" pt stated "My children are everywhere". Pt was unable to participate in d/c planning. Pt can benefit from psych eval . CSW continue to follow.   ED Mini Assessment: What brought you to the Emergency Department? : hypothermia  Barriers to Discharge: Continued Medical Work up        Interventions which prevented an admission or readmission: [facility], Homeless Screening, Transportation Screening    Patient Contact and Communications        ,                 Admission diagnosis:  cold exposure Patient Active Problem List   Diagnosis Date Noted   Valproic acid toxicity 02/22/2021   Malnutrition of moderate degree 11/10/2020   Hypertension    Acute encephalopathy 09/18/2020   Schizoaffective disorder, bipolar type ([place]) 05/13/2020   Bacteremia due to Staphylococcus aureus 10/03/2019   AKI (acute kidney injury) ([place]) 10/02/2019   Transaminitis 10/02/2019   CVA (cerebral vascular accident) ([place]) 10/02/2019   AMS (altered mental status) 10/01/2019   Lithium toxicity 02/16/2019   Hyperkalemia 02/16/2019   Acute confusion 97/67/3419   Toxic metabolic encephalopathy 37/90/2409   Asthma 12/23/2018   Delirium due to another medical condition    Acute metabolic encephalopathy  73/53/2992   Positive hepatitis C antibody test 06/11/2018   PCP:  [facility], [facility] Pharmacy:   [id number] [address], [place] [address] Phone: [phone] Fax: [place] [facility] [place] [address] [place] [address] Phone: [phone] Fax: [phone]

## 2021-03-27 LAB — RPR: RPR Ser Ql: NONREACTIVE

## 2021-03-27 NOTE — BH Assessment (Addendum)
@  0932, Clinician reached back out to patient's nurses Julius Bowels, RN) and Maretta Bees, RN), checking to see if patient is available to be seen for her TTS. Awaiting updates. Provided updates of patient's status to the [facility] provider.

## 2021-03-27 NOTE — BH Assessment (Addendum)
@  0745, requested patient's nurse Julius Bowels, RN) to assist in setting up the TTS machine. Attempts made to reach patient via tele assessment, unsuccessful. Nursing provided updates.

## 2021-03-27 NOTE — BH Assessment (Addendum)
@  0845, informed by nursing that patient was moved out of room she was in for her tele assessment. The room was needed for another patient. Nursing will find another available room for TTS to be conducted. Clinician requested nursing to provide updates of when patient is ready, so that TTS is able to complete patient's assessment. TTS will continue to follow and make attempts to complete patients assessment.

## 2021-03-27 NOTE — ED Notes (Signed)
Pt currently speaking with CSW, will obtain vitals when finished.

## 2021-03-27 NOTE — Progress Notes (Signed)
CSW has not spoken with any staff from [facility] regarding patient's discharge plan.  Per [facility] handoff, patient is remain at the hospital until further notice.  Madilyn Fireman, MSW, LCSW Transitions of Care  Clinical Social Worker II [phone]

## 2021-03-27 NOTE — ED Notes (Signed)
Pt care taken, resting awaiting placement by case management.

## 2021-03-27 NOTE — BH Assessment (Signed)
At this time patient is still sleeping and cannot be teleassessed per [place] Nurse, NT.
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
## 2021-03-27 NOTE — Progress Notes (Signed)
  TOC CSW informed pt's APS worker, pt has been psych cleared and will be d/c today.   CSW contacted [facility] no open bed. [facility]- waiting list  [facility]  [facility]- The waiting list pt is already on.   CSW spoke with pt's old landlord Fredonia Highland, who offered to house pt, pt declined and wishes to d/c back to the motel. Pt receives food stamps and 1000 from SS, pt is aware she will need to provide food for herself, pt declines any assistance from [facility]. CSW informed pt she will need to contact her primary care to work on LTC placement. CSW informed APS worker Talbert Forest of pt's d/c plans. CSW will set up transportation back to [facility] [address], [place], [place] [address].  Arlie Solomons.Alyxandra Tenbrink, MSW, [place]  Transitions of Care Clinical Social Worker I Direct Dial: [phone]  Fax: [phone] [email or web address]

## 2021-03-27 NOTE — ED Notes (Signed)
APS at bedside 
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
## 2021-03-27 NOTE — ED Notes (Signed)
Pt refused vitals and refused to be cleaned up.

## 2021-03-27 NOTE — ED Provider Notes (Signed)
Patient was seen by psychiatry this morning.  She is cleared for discharge.  I discussed this with the patient and she is ready to leave.   Dorie Rank, MD 03/27/21 1310

## 2021-03-27 NOTE — Discharge Instructions (Addendum)
For your behavioral health needs you are advised to follow up with [facility] at your earliest opportunity:      [facility]      [place], [place] [address]      [phone]      They offer psychiatry/medication management and therapy.  New patients are seen in their walk-in clinic.  Walk-in hours are Monday - Thursday from 8:00 am - 11:00 am for psychiatry, and Friday from 1:00 pm - 4:00 pm for therapy.  Walk-in patients are seen on a first come, first served basis, so try to arrive as early as possible for the best chance of being seen the same day.  Please note that to be eligible for services you must bring an ID or a piece of mail with your name and a [facility] address.  As an alternative, you may be eligible for ACT Team services, which would include more frequent visits with your provider, as well as in-home services.  The following providers offer ACT Team services.  Contact them at your earliest opportunity to ask about enrolling in their program:       [facility]      [address] [place], [place] [address]      [phone]       [facility]      [address]      [place], [place] [address]      [phone]       [facility]      [address] [place]      [place], [place] [address]      [phone]       [facility]      [place], [address]      [address]      [place], [place]  [address]      [phone]       [facility]      [address]      [place], [place] [address]      [phone]

## 2021-03-27 NOTE — BH Assessment (Signed)
[facility] Assessment Progress Note   Per Sheran Fava, NP , this voluntary pt does not require psychiatric hospitalization at this time.  Pt is psychiatrically cleared.  Discharge instructions include referral information for [facility], as well as local ACT Team providers.  A TOC consult has been ordered to address pt's psychosocial needs.  EDP Dorie Rank, MD and pt's nurse, Julius Bowels, have been notified.  Jalene Mullet, [place] Triage Specialist [phone]

## 2021-03-28 NOTE — ED Provider Notes (Signed)
Emergency Medicine Observation Re-evaluation Note  Karen Best is a 65 y.o. female, seen on rounds today.  Pt initially presented to the ED for complaints of Cold Exposure and Psychiatric Evaluation (/) Currently, the patient is sleeping.  Physical Exam  BP (!) 131/95 (BP Location: Left Arm)   Pulse 71   Temp 98.1 F (36.7 C) (Oral)   Resp 18   Ht 5\' 5"  (1.651 m)   Wt 71.1 kg   SpO2 100%   BMI 26.08 kg/m  Physical Exam General: sleeping Cardiac: rrr Lungs: clear   ED Course / MDM  EKG:EKG Interpretation  Date/Time:  Tuesday March 26 2021 09:45:42 EST Ventricular Rate:  76 PR Interval:  154 QRS Duration: 84 QT Interval:  380 QTC Calculation: 427 R Axis:   -71 Text Interpretation: Normal sinus rhythm Right atrial enlargement Pulmonary disease pattern Left anterior fascicular block Abnormal ECG Confirmed by Dene Gentry [phone]) on 03/26/2021 10:00:42 AM  I have reviewed the labs performed to date as well as medications administered while in observation.  Recent changes in the last 24 hours include none.  Plan  Current plan is for placement in higher level of care.  CHEYANNE LAMISON is not under involuntary commitment.     Blanchie Dessert, MD 03/28/21 1054

## 2021-03-28 NOTE — ED Notes (Signed)
Pt bathed, linens, changed, and paper scrubs provided by ED tech.

## 2021-03-28 NOTE — NC FL2 (Signed)
[place] LEVEL OF CARE SCREENING TOOL     IDENTIFICATION  Patient Name: Karen Best Birthdate: August 01, 1955 Sex: female Admission Date (Current Location): 03/26/2021  [place] and [place] Number:  [name] [id number] [place] and Address:  [facility],  [place], [place]      Provider Number: [id number]  Attending Physician Name and Address:  Default, Provider, MD  Relative Name and Phone Number:       Current Level of Care: Hospital Recommended Level of Care: [facility], Other (Comment) (Group Home) Prior Approval Number:    Date Approved/Denied:   PASRR Number:    Discharge Plan: Other (Comment) (group home)    Current Diagnoses: Patient Active Problem List   Diagnosis Date Noted   Valproic acid toxicity 02/22/2021   Malnutrition of moderate degree 11/10/2020   Hypertension    Acute encephalopathy 09/18/2020   Schizoaffective disorder, bipolar type ([place]) 05/13/2020   Bacteremia due to Staphylococcus aureus 10/03/2019   AKI (acute kidney injury) ([place]) 10/02/2019   Transaminitis 10/02/2019   CVA (cerebral vascular accident) ([place]) 10/02/2019   AMS (altered mental status) 10/01/2019   Lithium toxicity 02/16/2019   Hyperkalemia 02/16/2019   Acute confusion 41/58/3094   Toxic metabolic encephalopathy 07/68/0881   Asthma 12/23/2018   Delirium due to another medical condition    Acute metabolic encephalopathy 02/26/5944   Positive hepatitis C antibody test 06/11/2018    Orientation RESPIRATION BLADDER Height & Weight     Self, Situation, Time, Place  Normal Continent Weight: 156 lb 12 oz (71.1 kg) Height:  5\' 5"  (165.1 cm)  BEHAVIORAL SYMPTOMS/MOOD NEUROLOGICAL BOWEL NUTRITION STATUS      Continent Diet  AMBULATORY STATUS COMMUNICATION OF NEEDS Skin   Independent Verbally Normal                       Personal Care Assistance Level of Assistance  Bathing, Feeding, Dressing Bathing Assistance:  Independent Feeding assistance: Independent Dressing Assistance: Independent     Functional Limitations Info  Sight, Hearing, Speech Sight Info: Adequate Hearing Info: Adequate Speech Info: Adequate    SPECIAL CARE FACTORS FREQUENCY                       Contractures Contractures Info: Not present    Additional Factors Info  Code Status, Allergies, Psychotropic Code Status Info: full Allergies Info: Haloperidol And Related Psychotropic Info: depakote, Zyprexa         Current Medications (03/28/2021):  This is the current hospital active medication list Current Facility-Administered Medications  Medication Dose Route Frequency Provider Last Rate Last Admin   divalproex (DEPAKOTE) DR tablet 500 mg  500 mg Oral Q12H Valarie Merino, MD   500 mg at 03/27/21 2151   OLANZapine (ZYPREXA) tablet 5 mg  5 mg Oral QHS Valarie Merino, MD   5 mg at 03/27/21 2151   valbenazine (INGREZZA) capsule 40 mg  40 mg Oral QHS Valarie Merino, MD       Current Outpatient Medications  Medication Sig Dispense Refill   clonazePAM (KLONOPIN) 0.5 MG tablet Take 0.5 mg by mouth 2 (two) times daily as needed for anxiety.     divalproex (DEPAKOTE) 500 MG DR tablet Take 1 tablet (500 mg total) by mouth every 12 (twelve) hours. 60 tablet 2   melatonin 5 MG TABS Take 1 tablet (5 mg total) by mouth at bedtime. 30 tablet 0   metoprolol  tartrate (LOPRESSOR) 25 MG tablet Take 0.5 tablets (12.5 mg total) by mouth 2 (two) times daily. 30 tablet 0   OLANZapine (ZYPREXA) 5 MG tablet Take 1 tablet (5 mg total) by mouth at bedtime. 30 tablet 3   valbenazine (INGREZZA) 40 MG capsule Take 1 capsule (40 mg total) by mouth at bedtime. 30 capsule 2     Discharge Medications: Please see discharge summary for a list of discharge medications.  Relevant Imaging Results:  Relevant Lab Results:   Additional Information [phone]  [place], LCSW

## 2021-03-28 NOTE — ED Notes (Signed)
Pt eating breakfast at this time.  

## 2021-03-28 NOTE — Progress Notes (Signed)
APS supervisor Kevin Fenton reported DSS is petitioning for guardianship. Per APS supervisor it can take up to 48 hrs to present the case in front of a judge.  Their plan is to find a Group Home or [place] for pt to d/c. DSS does not have anywhere to place pt as of right now. APS supervisor provided [employer] -placement SW contact information to provided group home contact list.   CSW spoke with Carin Primrose who stated he has an opening in his Group home. CSW faxed out pt's FL2. CSW is waiting for a response.    Arlie Solomons.Tarquin Welcher, MSW, [place]  Transitions of Care Clinical Social Worker I Direct Dial: [phone]  Fax: [phone] [email or web address]

## 2021-03-28 NOTE — ED Notes (Signed)
Pt given dinner tray.

## 2021-03-28 NOTE — Progress Notes (Signed)
TOC CSW spoke with pt's APS worker Karen Best, she reported pt's guardianship case will be heard tomorrow. She stated DSS will pick pt up from the hospital after court hearing. APS worker is requesting pt's FL2 and any placement options that have availability this CSW contacted.   Karen Best.Karen Best, MSW, Karen Best  Transitions of Care Clinical Social Worker I Direct Dial: [phone]  Fax: [phone] [email or web address]

## 2021-03-29 MED ORDER — COVID-19 MRNA VACC (MODERNA) 100 MCG/0.5ML IM SUSP
0.5000 mL | Freq: Once | INTRAMUSCULAR | Status: AC
  Administered 2021-03-29: 0.5 mL via INTRAMUSCULAR
  Filled 2021-03-29: qty 0.5

## 2021-03-29 NOTE — ED Provider Notes (Addendum)
Emergency Medicine Observation Re-evaluation Note  Karen Best is a 65 y.o. female, seen on rounds today.  Pt initially presented to the ED for complaints of Cold Exposure and Psychiatric Evaluation (/) Currently, the patient is awake, calm.  Physical Exam  BP 123/88 (BP Location: Left Arm)   Pulse 68   Temp 97.8 F (36.6 C) (Oral)   Resp 16   Ht 5\' 5"  (1.651 m)   Wt 71.1 kg   SpO2 100%   BMI 26.08 kg/m  Physical Exam HENT:     Nose: Nose normal.  Skin:    Capillary Refill: Capillary refill takes less than 2 seconds.  Neurological:     General: No focal deficit present.     Mental Status: She is alert.  Psychiatric:        Mood and Affect: Mood normal.     ED Course / MDM  EKG:EKG Interpretation  Date/Time:  Tuesday March 26 2021 09:45:42 EST Ventricular Rate:  76 PR Interval:  154 QRS Duration: 84 QT Interval:  380 QTC Calculation: 427 R Axis:   -71 Text Interpretation: Normal sinus rhythm Right atrial enlargement Pulmonary disease pattern Left anterior fascicular block Abnormal ECG Confirmed by Dene Gentry [phone]) on 03/26/2021 10:00:42 AM  I have reviewed the labs performed to date as well as medications administered while in observation.  Recent changes in the last 24 hours include nothing .  Adult Protective Services asking for TB test and patient to receive first dose of COVID-vaccine.  This has been ordered.  Plan  Current plan is for placement.  Karen Best is not under involuntary commitment.     Lennice Sites, DO 03/29/21 0914    Lennice Sites, DO 03/29/21 [phone]

## 2021-03-29 NOTE — Progress Notes (Signed)
CSW spoke with [facility] APS after hours social worker Currie Paris.  She was not aware of a plan to pick pt up from [facility] after court today.  After looking into it further, Ms Tim Lair reports that there is not information in their system about the results of the guardianship hearing.  There is a note that a placement provider is supposed to come to [facility] on Saturday, 12/3, to do an initial assessment on this pt.  Did not sound like placement has been secured, just a potential placement provider identified.  Lurline Idol, MSW, LCSW 12/2/20228:15 PM

## 2021-03-29 NOTE — ED Notes (Signed)
COVID-19 Vaccination Card in pt folder. Karen Best [id number] on 03/29/2021.  Quantiferon-TB specimen collected 12/02 at 15:03 and in process at lab 15:54.

## 2021-03-29 NOTE — Progress Notes (Signed)
TOC CSW received message from APS worker, requesting TB and COVID vaccine, CSW made EDP and RN aware.   Arlie Solomons.Macguire Holsinger, MSW, [place]  Transitions of Care Clinical Social Worker I Direct Dial: [phone]  Fax: [phone] [email or web address]

## 2021-03-30 NOTE — ED Provider Notes (Signed)
Emergency Medicine Observation Re-evaluation Note  Karen Best is a 65 y.o. female, seen on rounds today.  Pt initially presented to the ED for complaints of Cold Exposure and Psychiatric Evaluation (/) Currently, the patient is history of schizoaffective disorder and homelessness.  Yesterday they spoke with [facility] APS.  Custody hearing seems to be not completed.  There was some question whether patient would be seen by placement provider on Saturday.  Placement not secured.  Patient not under involuntary commitment..  Physical Exam  BP 114/75 (BP Location: Right Arm)   Pulse 69   Temp 97.6 F (36.4 C) (Oral)   Resp 17   Ht 1.651 m (5\' 5" )   Wt 71.1 kg   SpO2 97%   BMI 26.08 kg/m  Physical Exam General: Sleeping Cardiac:  Lungs: No respiratory distress Psych: Baseline  ED Course / MDM  EKG:EKG Interpretation  Date/Time:  Tuesday March 26 2021 09:45:42 EST Ventricular Rate:  76 PR Interval:  154 QRS Duration: 84 QT Interval:  380 QTC Calculation: 427 R Axis:   -71 Text Interpretation: Normal sinus rhythm Right atrial enlargement Pulmonary disease pattern Left anterior fascicular block Abnormal ECG Confirmed by Dene Gentry [phone]) on 03/26/2021 10:00:42 AM  I have reviewed the labs performed to date as well as medications administered while in observation.  Recent changes in the last 24 hours include yesterday patient had TB test placed and had initial COVID-vaccine first dose..  Plan  Current plan is for clearance by [place] Adult Protective Services.[name] is not under involuntary commitment.     Fredia Sorrow, MD 03/30/21 [phone]

## 2021-03-30 NOTE — ED Notes (Signed)
Jamas Lav and Erich Montane from [facility] evaluated pt for 30 minutes. Gave them social work number for future communication.

## 2021-03-30 NOTE — ED Notes (Signed)
Pt has been calm and cooperative from 7:05 am to the present. I woke up pt to eat breakfast, lunch, and dinner. Otherwise, pt slept throughout the day. Pt has urinary incontinence. Pt needs 1 assistant for safety when standing and walking. Pt needs a walker or wheelchair. Pt unable to change her brief or dress herself. Pt can feed herself. Pt is oriented to person only.

## 2021-03-30 NOTE — ED Notes (Signed)
Karen Best from a group home and Adult Product manager Karen Best at bedside.

## 2021-03-31 NOTE — ED Provider Notes (Signed)
Emergency Medicine Observation Re-evaluation Note  Karen Best is a 65 y.o. female, seen on rounds today.  Pt initially presented to the ED for complaints of Cold Exposure and Psychiatric Evaluation (/) Currently, the patient is calm eating breakfast this morning.  Physical Exam  BP 126/68 (BP Location: Right Arm)   Pulse 67   Temp 97.8 F (36.6 C) (Oral)   Resp 18   Ht 1.651 m (5\' 5" )   Wt 71.1 kg   SpO2 97%   BMI 26.08 kg/m  Physical Exam General: Alert, no acute distress Cardiac: Regular rate Lungs: No respiratory difficulties, normal rate Psych: Calm, cooperative  ED Course / MDM  EKG:EKG Interpretation  Date/Time:  Tuesday March 26 2021 09:45:42 EST Ventricular Rate:  76 PR Interval:  154 QRS Duration: 84 QT Interval:  380 QTC Calculation: 427 R Axis:   -71 Text Interpretation: Normal sinus rhythm Right atrial enlargement Pulmonary disease pattern Left anterior fascicular block Abnormal ECG Confirmed by Dene Gentry [phone]) on 03/26/2021 10:00:42 AM  I have reviewed the labs performed to date as well as medications administered while in observation.  Recent changes in the last 24 hours include no acute medical issues..  Plan  Current plan is for Disposition Per [facility] Adult [employer].  We are awaiting for determination of the guardianship hearing.  Karen Best is not under involuntary commitment.     Dorie Rank, MD 03/31/21 [phone]

## 2021-03-31 NOTE — ED Notes (Signed)
Pt asleep at dinner time meal save for later

## 2021-03-31 NOTE — ED Notes (Signed)
Patient alert this shift. Patient calm and cooperative. Medication compliant. Patient ate breakfast.  Patient resting. No s/s of distress.

## 2021-04-01 ENCOUNTER — Emergency Department (HOSPITAL_COMMUNITY): Payer: Medicaid Other

## 2021-04-01 LAB — QUANTIFERON-TB GOLD PLUS (RQFGPL)
QuantiFERON Mitogen Value: >10 IU/mL
QuantiFERON Nil Value: 2.15 IU/mL
QuantiFERON TB1 Ag Value: >10 IU/mL
QuantiFERON TB2 Ag Value: >10 IU/mL

## 2021-04-01 LAB — QUANTIFERON-TB GOLD PLUS: QuantiFERON-TB Gold Plus: POSITIVE — AB

## 2021-04-01 MED ORDER — DOCUSATE SODIUM 100 MG PO CAPS
100.0000 mg | ORAL_CAPSULE | Freq: Two times a day (BID) | ORAL | Status: DC | PRN
  Administered 2021-04-02 – 2021-04-05 (×2): 100 mg via ORAL
  Filled 2021-04-01 (×2): qty 1

## 2021-04-01 MED ORDER — SENNA 8.6 MG PO TABS
1.0000 | ORAL_TABLET | Freq: Every day | ORAL | Status: DC | PRN
  Administered 2021-04-02 – 2021-04-05 (×2): 8.6 mg via ORAL
  Filled 2021-04-01 (×2): qty 1

## 2021-04-01 NOTE — Progress Notes (Signed)
CSW spoke with Karen Best pt APS worker, she stated calling and leaving VM for CSW Friday afternoon, no VM was seen on CSW cell phone. APS worker stated pt was screened by several different group homes this past weekend. However, there are concerns pt will need a higher level of care, as [facility] owners were told pt needs assistance with ambulating, dressing, and incontinence issues. APS worker is requesting a new FL2 to be completed. CSW made MD aware and requested PT evaluation. CSW continue to follow.   Karen Best.Karen Best, MSW, [place]  Transitions of Care Clinical Social Worker I Direct Dial: [phone]  Fax: [phone] [email or web address]   .

## 2021-04-01 NOTE — Progress Notes (Signed)
Messaged Dr Darl Householder of positive Quantiferon results

## 2021-04-01 NOTE — ED Provider Notes (Signed)
I was called by nursing because patient's QuantiFERON gold is positive.  I have reviewed her chart and QuantiFERON was ordered because patient is homeless and needs psych placement.  Patient has no cough.  I discussed case with Dr. Juleen China from infectious disease.  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  He recommend repeating chest x-ray and if it does not show any cavitary lesion, patient does not need to be on isolation. Patient can get treatment for latent TB at a future date.   8:29 PM Chest x-ray is clear.  Patient does not need further evaluation for latent TB and can get treatment at a later date.  Patient continues to be medically cleared for psych placement.   Drenda Freeze, MD 04/01/21 2029

## 2021-04-01 NOTE — ED Provider Notes (Addendum)
Emergency Medicine Observation Re-evaluation Note  Karen Best is a 65 y.o. female, seen on rounds today.  Pt initially presented to the ED for complaints of Cold Exposure and Psychiatric Evaluation (/) Currently, the patient is sleeping this morning.  Physical Exam  BP (!) 99/57 (BP Location: Right Arm)   Pulse 66   Temp 98.1 F (36.7 C) (Oral)   Resp 16   Ht 1.651 m (5\' 5" )   Wt 71.1 kg   SpO2 97%   BMI 26.08 kg/m  Physical Exam General: Resting Cardiac: Regular rate Lungs: Breathing easily Psych: Resting, calm  ED Course / MDM  EKG:EKG Interpretation  Date/Time:  Tuesday March 26 2021 09:45:42 EST Ventricular Rate:  76 PR Interval:  154 QRS Duration: 84 QT Interval:  380 QTC Calculation: 427 R Axis:   -71 Text Interpretation: Normal sinus rhythm Right atrial enlargement Pulmonary disease pattern Left anterior fascicular block Abnormal ECG Confirmed by Dene Gentry [phone]) on 03/26/2021 10:00:42 AM  I have reviewed the labs performed to date as well as medications administered while in observation.  Recent changes in the last 24 hours include no acute changes.  Plan  Current plan is for waiting for determination from [place] services.  Karen Best is not under involuntary commitment.     Dorie Rank, MD 04/01/21 [phone] Social work recommends PT eval to assist with the patient's disposition.  PT eval ordered   Dorie Rank, MD 04/01/21 1320

## 2021-04-02 NOTE — Evaluation (Addendum)
Physical Therapy Evaluation Patient Details Name: Karen Best MRN: [id number] DOB: [birth year] Today's Date: 04/02/2021  History of Present Illness  65 year old female with prior medical history schizophrenia/bipolaras CVA, HTN detailed below presents via EMS.  Patient was reportedly found outside a local business.  She was dropped off by a vehicle.  Patient was hypothermic,  Clinical Impression  The patient  presents with flat affect, did follow directions  and able to participate in mobility and ambulated x 60' with Rw and min to  min guard assistance. Pt admitted with above diagnosis.  Pt currently with functional limitations due to the deficits listed below (see PT Problem List). Pt will benefit from skilled PT to increase their independence and safety with mobility to allow discharge to the venue listed below.        Recommendations for follow up therapy are one component of a multi-disciplinary discharge planning process, led by the attending physician.  Recommendations may be updated based on patient status, additional functional criteria and insurance authorization.  Follow Up Recommendations Long term institutuitional care/no PT follow up.    Assistance Recommended at Discharge Intermittent Supervision/Assistance  Functional Status Assessment Patient has not had a recent decline in their functional status  Equipment Recommendations  None recommended by PT    Recommendations for Other Services       Precautions / Restrictions Precautions Precautions: Fall      Mobility  Bed Mobility Overal bed mobility: Needs Assistance Bed Mobility: Supine to Sit     Supine to sit: Supervision     General bed mobility comments: extra time    Transfers Overall transfer level: Needs assistance Equipment used: Rolling walker (2 wheels) Transfers: Sit to/from Stand Sit to Stand: Min guard           General transfer comment: able to push from  bed and stand at [facility]     Ambulation/Gait Ambulation/Gait assistance: Min assist Gait Distance (Feet): 60 Feet Assistive device: Rolling walker (2 wheels) Gait Pattern/deviations: Step-to pattern;Step-through pattern;Decreased stride length;Shuffle Gait velocity: decr     General Gait Details: shuffling gait , flexed posture.  Stairs            Wheelchair Mobility    Modified Rankin (Stroke Patients Only)       Balance Overall balance assessment: Needs assistance Sitting-balance support: No upper extremity supported;Feet supported Sitting balance-Leahy Scale: Fair     Standing balance support: During functional activity;Bilateral upper extremity supported;Reliant on assistive device for balance Standing balance-Leahy Scale: Poor Standing balance comment: steady to support on RW for briefs to be changed                             Pertinent Vitals/Pain Pain Assessment: No/denies pain    Home Living Family/patient expects to be discharged to:: Shelter/Homeless                   Additional Comments: homelessness    Prior Function                 ADLs Comments: [employer] , reports resisided in a motel     Hand Dominance   Dominant Hand: Right    Extremity/Trunk Assessment   Upper Extremity Assessment Upper Extremity Assessment: Overall WFL for tasks assessed    Lower Extremity Assessment Lower Extremity Assessment: Overall WFL for tasks assessed    Cervical / Trunk Assessment Cervical / Trunk Assessment: Normal  Communication  Communication: No difficulties  Cognition Arousal/Alertness: Awake/alert Behavior During Therapy: Flat affect Overall Cognitive Status: No family/caregiver present to determine baseline cognitive functioning Area of Impairment: Orientation                 Orientation Level: Time;Situation             General Comments: only ansered  brief questions, able to follow simple directions        General Comments       Exercises     Assessment/Plan    PT Assessment Patient needs continued PT services  PT Problem List Decreased strength;Decreased mobility;Decreased activity tolerance;Decreased safety awareness;Decreased balance;Decreased knowledge of use of DME;Decreased cognition       PT Treatment Interventions DME instruction;Gait training;Functional mobility training;Therapeutic activities;Therapeutic exercise;Patient/family education    PT Goals (Current goals can be found in the Care Plan section)  Acute Rehab PT Goals Patient Stated Goal: agreed to ambulated PT Goal Formulation: Patient unable to participate in goal setting Time For Goal Achievement: 04/16/21 Potential to Achieve Goals: Fair    Frequency Min 1X/week   Barriers to discharge        Co-evaluation               AM-PAC PT "6 Clicks" Mobility  Outcome Measure Help needed turning from your back to your side while in a flat bed without using bedrails?: None Help needed moving from lying on your back to sitting on the side of a flat bed without using bedrails?: None Help needed moving to and from a bed to a chair (including a wheelchair)?: A Little Help needed standing up from a chair using your arms (e.g., wheelchair or bedside chair)?: A Little Help needed to walk in hospital room?: A Little Help needed climbing 3-5 steps with a railing? : A Lot 6 Click Score: 19    End of Session Equipment Utilized During Treatment: Gait belt Activity Tolerance: Patient tolerated treatment well Patient left: in chair;with nursing/sitter in room Nurse Communication: Mobility status PT Visit Diagnosis: Unsteadiness on feet (R26.81)    Time: 9675-9163 PT Time Calculation (min) (ACUTE ONLY): 18 min   Charges:   PT Evaluation $PT Eval Low Complexity: [place] Pager [phone] Office [phone]   Claretha Cooper 04/02/2021, 9:27 AM

## 2021-04-02 NOTE — Progress Notes (Signed)
CSW spoke with pt's APS worker [facility] [phone]), and informed her of PT recommendations. APS worker stated she will e-mail CSW list of appropriate facility for pt to d/c to. CSW to complete new FL2 for ALF. CSW continue to follow.  Arlie Solomons.Kellye Mizner, MSW, [place]  Transitions of Care Clinical Social Worker I Direct Dial: [phone]  Fax: [phone] [email or web address]

## 2021-04-02 NOTE — NC FL2 (Signed)
[place] LEVEL OF CARE SCREENING TOOL     IDENTIFICATION  Patient Name: Karen Best Birthdate: [birth year] Sex: female Admission Date (Current Location): 03/26/2021  [place] and [place] Number:  [name] [id number] [place] and Address:  [facility],  [place], [place]      Provider Number: [id number]  Attending Physician Name and Address:  Default, Provider, MD  Relative Name and Phone Number:       Current Level of Care: Hospital Recommended Level of Care: [place] Prior Approval Number:    Date Approved/Denied:   PASRR Number: pending  Discharge Plan: Other (Comment) (ALF)    Current Diagnoses: Patient Active Problem List   Diagnosis Date Noted   Valproic acid toxicity 02/22/2021   Malnutrition of moderate degree 11/10/2020   Hypertension    Acute encephalopathy 09/18/2020   Schizoaffective disorder, bipolar type ([place]) 05/13/2020   Bacteremia due to Staphylococcus aureus 10/03/2019   AKI (acute kidney injury) ([place]) 10/02/2019   Transaminitis 10/02/2019   CVA (cerebral vascular accident) ([place]) 10/02/2019   AMS (altered mental status) 10/01/2019   Lithium toxicity 02/16/2019   Hyperkalemia 02/16/2019   Acute confusion 70/26/3785   Toxic metabolic encephalopathy 88/50/2774   Asthma 12/23/2018   Delirium due to another medical condition    Acute metabolic encephalopathy 12/87/8676   Positive hepatitis C antibody test 06/11/2018    Orientation RESPIRATION BLADDER Height & Weight     Self (orientation fluctuates)  Normal Incontinent Weight: 156 lb 12 oz (71.1 kg) Height:  5\' 5"  (165.1 cm)  BEHAVIORAL SYMPTOMS/MOOD NEUROLOGICAL BOWEL NUTRITION STATUS      Incontinent Diet (regular)  AMBULATORY STATUS COMMUNICATION OF NEEDS Skin   Limited Assist (stand by assit) Verbally Normal                       Personal Care Assistance Level of Assistance  Bathing, Feeding, Dressing Bathing  Assistance: Limited assistance Feeding assistance: Independent Dressing Assistance: Limited assistance     Functional Limitations Info  Sight, Hearing, Speech Sight Info: Adequate Hearing Info: Adequate Speech Info: Adequate    SPECIAL CARE FACTORS FREQUENCY                       Contractures Contractures Info: Not present    Additional Factors Info  Code Status, Allergies Code Status Info: full Allergies Info: Haloperidol And Related Psychotropic Info: OLANZapine (ZYPREXA) tablet 5 mg, divalproex (DEPAKOTE) DR tablet 500 mg         Current Medications (04/02/2021):  This is the current hospital active medication list Current Facility-Administered Medications  Medication Dose Route Frequency Provider Last Rate Last Admin   divalproex (DEPAKOTE) DR tablet 500 mg  500 mg Oral Q12H Valarie Merino, MD   500 mg at 04/02/21 7209   docusate sodium (COLACE) capsule 100 mg  100 mg Oral BID PRN Drenda Freeze, MD   100 mg at 04/02/21 0136   OLANZapine (ZYPREXA) tablet 5 mg  5 mg Oral QHS Valarie Merino, MD   5 mg at 04/02/21 0136   senna (SENOKOT) tablet 8.6 mg  1 tablet Oral Daily PRN Drenda Freeze, MD   8.6 mg at 04/02/21 0136   valbenazine (INGREZZA) capsule 40 mg  40 mg Oral QHS Valarie Merino, MD   40 mg at 04/02/21 0145   Current Outpatient Medications  Medication Sig Dispense Refill   clonazePAM (KLONOPIN) 0.5 MG tablet  Take 0.5 mg by mouth 2 (two) times daily as needed for anxiety.     divalproex (DEPAKOTE) 500 MG DR tablet Take 1 tablet (500 mg total) by mouth every 12 (twelve) hours. 60 tablet 2   melatonin 5 MG TABS Take 1 tablet (5 mg total) by mouth at bedtime. 30 tablet 0   metoprolol tartrate (LOPRESSOR) 25 MG tablet Take 0.5 tablets (12.5 mg total) by mouth 2 (two) times daily. 30 tablet 0   OLANZapine (ZYPREXA) 5 MG tablet Take 1 tablet (5 mg total) by mouth at bedtime. 30 tablet 3   valbenazine (INGREZZA) 40 MG capsule Take 1 capsule (40 mg  total) by mouth at bedtime. 30 capsule 2     Discharge Medications: Please see discharge summary for a list of discharge medications.  Relevant Imaging Results:  Relevant Lab Results:   Additional Information SSN [id number]  COVID x [place], LCSW

## 2021-04-02 NOTE — ED Provider Notes (Signed)
Emergency Medicine Observation Re-evaluation Note  SWEDEN LESURE is a 65 y.o. female, seen on rounds today.  Pt initially presented to the ED for complaints of Cold Exposure and Psychiatric Evaluation (/) Currently, the patient is sleeping this morning.  Physical Exam  BP 117/65 (BP Location: Right Arm)   Pulse 64   Temp 98.1 F (36.7 C)   Resp 18   Ht 5\' 5"  (1.651 m)   Wt 71.1 kg   SpO2 99%   BMI 26.08 kg/m  Physical Exam General: Resting Cardiac: Regular rate Lungs: Breathing easily Psych: Resting, calm  ED Course / MDM  EKG:EKG Interpretation  Date/Time:  Tuesday March 26 2021 09:45:42 EST Ventricular Rate:  76 PR Interval:  154 QRS Duration: 84 QT Interval:  380 QTC Calculation: 427 R Axis:   -71 Text Interpretation: Normal sinus rhythm Right atrial enlargement Pulmonary disease pattern Left anterior fascicular block Abnormal ECG Confirmed by Dene Gentry [phone]) on 03/26/2021 10:00:42 AM  I have reviewed the labs performed to date as well as medications administered while in observation.  Recent changes in the last 24 hours include no acute changes.  Plan  Current plan is for waiting for determination from [place] services.  KLOI BRODMAN is not under involuntary commitment.    Deno Etienne, DO 04/02/21 [phone]

## 2021-04-03 NOTE — ED Notes (Signed)
Pt had a large BM. Peri care was provided and new purewick was placed.

## 2021-04-03 NOTE — ED Provider Notes (Signed)
Emergency Medicine Observation Re-evaluation Note  Karen Best is a 65 y.o. female, seen on rounds today.  Pt initially presented to the ED for complaints of Cold Exposure and Psychiatric Evaluation (/) Currently, the patient is awaiting group home placement.  Physical Exam  BP 110/65 (BP Location: Right Arm)   Pulse 67   Temp 98.1 F (36.7 C) (Oral)   Resp 16   Ht 1.651 m (5\' 5" )   Wt 71.1 kg   SpO2 94%   BMI 26.08 kg/m  Physical Exam General: No acute distress Cardiac:  Lungs:  Psych: Baseline  ED Course / MDM  EKG:EKG Interpretation  Date/Time:  Tuesday March 26 2021 09:45:42 EST Ventricular Rate:  76 PR Interval:  154 QRS Duration: 84 QT Interval:  380 QTC Calculation: 427 R Axis:   -71 Text Interpretation: Normal sinus rhythm Right atrial enlargement Pulmonary disease pattern Left anterior fascicular block Abnormal ECG Confirmed by Dene Gentry [phone]) on 03/26/2021 10:00:42 AM  I have reviewed the labs performed to date as well as medications administered while in observation.  Recent changes in the last 24 hours include no significant change or events.  Plan  Current plan is for the store looking for placement for this patient.  Will be group home placement.  Karen Best is not under involuntary commitment.     Fredia Sorrow, MD 04/03/21 1011

## 2021-04-03 NOTE — Progress Notes (Signed)
TOC CSW spoke with [facility], they reported having a possible female bed however pt's needs are greater than the facility can care for, as the facility does not have third-shift staff.   CSW spoke with [facility], they have a possible bed available, CSW  faxed out pt's referral to [facility], awaiting a response.  Attempted to contact [facility] home, no response phone continues to ring. Will call back.   CSW contacted [facility] to inquire about availability, but no available beds.     11:12 am Received an e-mail from [facility] admissions) she stated having available beds and is willing to assess pt for admission. CSW continue to follow.   Arlie Solomons.Arwyn Besaw, MSW, [place]  Transitions of Care Clinical Social Worker I Direct Dial: [phone]  Fax: [phone] [email or web address]

## 2021-04-04 NOTE — ED Notes (Signed)
Patient alert this shift. Patient calm, cooperative, no s/s of distress.  Medication compliant.  No suicidal ideation or homicidal ideation noted. Patient needs assist with ADLs.

## 2021-04-04 NOTE — Progress Notes (Addendum)
CSW received message from APS worker Georgianne Fick , she reported pt has LTC Medicaid. She reported working to get pt's Medicaid change to special assistance Medicaid for pt to qualify for ALF.   CSW attempted to contact  [facility].Jamonta Goerner, MSW, [place]  Transitions of Care Clinical Social Worker I Direct Dial: [phone]  Fax: [phone] [email or web address]

## 2021-04-04 NOTE — Progress Notes (Signed)
Attempted to contact [place] to inquire about assessment for placement, no answer left HIPPA compliant VM.      [place] to inquire about LTC placement, Debbie requested information fax out. CSW contacted APS worker Talbert Forest and obtain verbal consent to send out pt's information. CSW continue to follow.  Arlie Solomons.Yadriel Kerrigan, MSW, [place]  Transitions of Care Clinical Social Worker I Direct Dial: [phone]  Fax: [phone] [email or web address]

## 2021-04-05 ENCOUNTER — Other Ambulatory Visit: Payer: Self-pay

## 2021-04-05 NOTE — ED Provider Notes (Signed)
Emergency Medicine Observation Re-evaluation Note  Karen Best is a 65 y.o. female, seen on rounds today.  Pt initially presented to the ED for complaints of Cold Exposure and Psychiatric Evaluation (/) Currently, the patient is awaiting placement.  Patient has no complaints.  She denies chest pain or shortness of breath..  Physical Exam  BP 115/76 (BP Location: Right Arm)   Pulse 60   Temp 98 F (36.7 C) (Oral)   Resp 16   Ht 5\' 5"  (1.651 m)   Wt 71.1 kg   SpO2 98%   BMI 26.08 kg/m  Physical Exam General: Resting quietly in bed.  Awakens to light stimulus.  No respiratory distress Cardiac: Regular. Lungs: Clear to auscultation. Psych: Calm and cooperative. Skeletal: No peripheral edema calf soft nontender.  ED Course / MDM  EKG:EKG Interpretation  Date/Time:  Tuesday March 26 2021 09:45:42 EST Ventricular Rate:  76 PR Interval:  154 QRS Duration: 84 QT Interval:  380 QTC Calculation: 427 R Axis:   -71 Text Interpretation: Normal sinus rhythm Right atrial enlargement Pulmonary disease pattern Left anterior fascicular block Abnormal ECG Confirmed by Dene Gentry [id number]) on 03/26/2021 10:00:42 AM  I have reviewed the labs performed to date as well as medications administered while in observation.  Recent changes in the last 24 hours include none.  No signs of distress.  Vital signs stable.  Plan  Current plan is for group home placement.  JACALYNN BUZZELL is not under involuntary commitment.     Charlesetta Shanks, MD 04/05/21 [phone]

## 2021-04-05 NOTE — ED Notes (Signed)
Pt ate ~25% of her breakfast tray. Pt had no incontinence episode at this time. Pt laying in bed comfortably and resting.
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
## 2021-04-05 NOTE — ED Notes (Signed)
Requested pt stand and walk while Ms. Williams (APS SW) at bedside. Pt sat up in bed, stood up independently with walker, and ambulated 1 foot before requesting to sit down because her feet hurt.

## 2021-04-05 NOTE — Progress Notes (Signed)
CSW contact Debbie from [place] to follow up about possible placement. She stated she is working to verify [employer], and will call CSW back.  CSW spoke with pt's APS worker Talbert Forest, CSW explained pt is being reviewed by Fortunato Curling and provided contact information for [facility] admission coordinator.  APS worker is requesting a new FL2 to be completed for LTC nursing facilities instead of ALF. CSW to update FL2 and obtain PASRR .    Arlie Solomons.La Shehan, MSW, [place]  Transitions of Care Clinical Social Worker I Direct Dial: [phone]  Fax: [phone] [email or web address]

## 2021-04-05 NOTE — ED Notes (Signed)
Writer woke up pt at this time. Gave pt her breakfast tray and assisted w setting up breakfast. Pt sitting up in bed, feeding herself.

## 2021-04-06 NOTE — ED Notes (Signed)
Purewick placed on patient.

## 2021-04-06 NOTE — Progress Notes (Signed)
Physical Therapy Treatment Patient Details Name: Karen Best MRN: [id number] DOB: 1955/11/27 Today's Date: 04/06/2021   History of Present Illness 65 year old female with prior medical history schizophrenia/bipolaras CVA, HTN detailed below presents via EMS.  Patient was reportedly found outside a local business.  She was dropped off by a vehicle.  Patient was hypothermic,    PT Comments    Pt tolerated increased ambulation distance of 120' with RW, no loss of balance, distance limited by B foot pain, RN notified of pt c/o foot pain.    Recommendations for follow up therapy are one component of a multi-disciplinary discharge planning process, led by the attending physician.  Recommendations may be updated based on patient status, additional functional criteria and insurance authorization.  Follow Up Recommendations  No PT follow up     Assistance Recommended at Discharge Intermittent Supervision/Assistance  Equipment Recommendations  None recommended by PT    Recommendations for Other Services       Precautions / Restrictions Precautions Precautions: Fall Restrictions Weight Bearing Restrictions: No     Mobility  Bed Mobility Overal bed mobility: Modified Independent Bed Mobility: Supine to Sit     Supine to sit: Modified independent (Device/Increase time)     General bed mobility comments: extra time    Transfers Overall transfer level: Needs assistance Equipment used: Rolling walker (2 wheels) Transfers: Sit to/from Stand Sit to Stand: Min guard           General transfer comment: able to push from  bed and stand at RW    Ambulation/Gait Ambulation/Gait assistance: Min guard Gait Distance (Feet): 120 Feet Assistive device: Rolling walker (2 wheels) Gait Pattern/deviations: Step-to pattern;Step-through pattern;Decreased stride length;Shuffle       General Gait Details: shuffling gait , flexed posture, LLE externally rotated   Stairs              Wheelchair Mobility    Modified Rankin (Stroke Patients Only)       Balance Overall balance assessment: Needs assistance Sitting-balance support: No upper extremity supported;Feet supported Sitting balance-Leahy Scale: Fair     Standing balance support: During functional activity;Bilateral upper extremity supported;Reliant on assistive device for balance Standing balance-Leahy Scale: Poor                              Cognition Arousal/Alertness: Awake/alert Behavior During Therapy: Flat affect Overall Cognitive Status: No family/caregiver present to determine baseline cognitive functioning Area of Impairment: Orientation                 Orientation Level: Time;Situation             General Comments: only ansered  brief questions, able to follow simple directions        Exercises      General Comments        Pertinent Vitals/Pain Pain Assessment: Faces Faces Pain Scale: Hurts a little bit Pain Location: B feet with walking Pain Descriptors / Indicators: Sore Pain Intervention(s): Limited activity within patient's tolerance;Monitored during session;Repositioned    Home Living                          Prior Function            PT Goals (current goals can now be found in the care plan section) Acute Rehab PT Goals Patient Stated Goal: agreed to ambulated PT Goal Formulation: Patient unable to  participate in goal setting Time For Goal Achievement: 04/16/21 Potential to Achieve Goals: Fair Progress towards PT goals: Progressing toward goals    Frequency    Min 1X/week      PT Plan Current plan remains appropriate    Co-evaluation              AM-PAC PT "6 Clicks" Mobility   Outcome Measure  Help needed turning from your back to your side while in a flat bed without using bedrails?: None Help needed moving from lying on your back to sitting on the side of a flat bed without using bedrails?:  None Help needed moving to and from a bed to a chair (including a wheelchair)?: A Little Help needed standing up from a chair using your arms (e.g., wheelchair or bedside chair)?: A Little Help needed to walk in hospital room?: A Little Help needed climbing 3-5 steps with a railing? : A Little 6 Click Score: 20    End of Session Equipment Utilized During Treatment: Gait belt Activity Tolerance: Patient tolerated treatment well Patient left: in chair;with nursing/sitter in room;with call bell/phone within reach Nurse Communication: Mobility status PT Visit Diagnosis: Unsteadiness on feet (R26.81)     Time: 4801-6553 PT Time Calculation (min) (ACUTE ONLY): 12 min  Charges:  $Gait Training: 8-22 mins                     Blondell Reveal Kistler PT 04/06/2021  Acute Rehabilitation Services Pager [phone] Office [phone]

## 2021-04-06 NOTE — Progress Notes (Signed)
PASRR submitted , screen sent to level 2 for additional  information.   Arlie Solomons.Daijanae Rafalski, MSW, [place]  Transitions of Care Clinical Social Worker I Direct Dial: [phone]  Fax: [phone] [email or web address]

## 2021-04-06 NOTE — NC FL2 (Signed)
[place] LEVEL OF CARE SCREENING TOOL     IDENTIFICATION  Patient Name: PETRITA BLUNCK Birthdate: August 03, 1955 Sex: female Admission Date (Current Location): 03/26/2021  [place] and [place] Number:  [name] [id number] [place] and Address:  [facility],  [place], [place]      Provider Number: [id number]  Attending Physician Name and Address:  Default, Provider, MD  Relative Name and Phone Number:  Georgianne Fick APS (worker ) [phone]    Current Level of Care: Hospital Recommended Level of Care: Nursing Facility Prior Approval Number:    Date Approved/Denied:   PASRR Number: Pending  Discharge Plan: SNF    Current Diagnoses: Patient Active Problem List   Diagnosis Date Noted   Valproic acid toxicity 02/22/2021   Malnutrition of moderate degree 11/10/2020   Hypertension    Acute encephalopathy 09/18/2020   Schizoaffective disorder, bipolar type ([place]) 05/13/2020   Bacteremia due to Staphylococcus aureus 10/03/2019   AKI (acute kidney injury) ([place]) 10/02/2019   Transaminitis 10/02/2019   CVA (cerebral vascular accident) ([place]) 10/02/2019   AMS (altered mental status) 10/01/2019   Lithium toxicity 02/16/2019   Hyperkalemia 02/16/2019   Acute confusion 32/67/1245   Toxic metabolic encephalopathy 80/99/8338   Asthma 12/23/2018   Delirium due to another medical condition    Acute metabolic encephalopathy 25/08/3974   Positive hepatitis C antibody test 06/11/2018    Orientation RESPIRATION BLADDER Height & Weight     Self (orientation fluctuates)  Normal Incontinent Weight: 156 lb 12 oz (71.1 kg) Height:  5\' 5"  (165.1 cm)  BEHAVIORAL SYMPTOMS/MOOD NEUROLOGICAL BOWEL NUTRITION STATUS      Incontinent Diet (regular)  AMBULATORY STATUS COMMUNICATION OF NEEDS Skin   Limited Assist (stand by assit) Verbally Normal                       Personal Care Assistance Level of Assistance  Bathing, Feeding, Dressing  Bathing Assistance: Limited assistance Feeding assistance: Independent Dressing Assistance: Limited assistance     Functional Limitations Info  Sight, Hearing, Speech Sight Info: Adequate Hearing Info: Adequate Speech Info: Adequate    SPECIAL CARE FACTORS FREQUENCY                       Contractures Contractures Info: Not present    Additional Factors Info  Code Status, Allergies Code Status Info: full Allergies Info: Haloperidol Psychotropic Info: divalproex,OLANZapine         Current Medications (04/06/2021):  This is the current hospital active medication list Current Facility-Administered Medications  Medication Dose Route Frequency Provider Last Rate Last Admin   divalproex (DEPAKOTE) DR tablet 500 mg  500 mg Oral Q12H Valarie Merino, MD   500 mg at 04/05/21 2139   docusate sodium (COLACE) capsule 100 mg  100 mg Oral BID PRN Drenda Freeze, MD   100 mg at 04/05/21 1024   OLANZapine (ZYPREXA) tablet 5 mg  5 mg Oral QHS Valarie Merino, MD   5 mg at 04/05/21 2140   senna (SENOKOT) tablet 8.6 mg  1 tablet Oral Daily PRN Drenda Freeze, MD   8.6 mg at 04/05/21 1025   valbenazine (INGREZZA) capsule 40 mg  40 mg Oral QHS Valarie Merino, MD   40 mg at 04/05/21 2142   Current Outpatient Medications  Medication Sig Dispense Refill   clonazePAM (KLONOPIN) 0.5 MG tablet Take 0.5 mg by mouth 2 (two) times daily as needed for  anxiety.     divalproex (DEPAKOTE) 500 MG DR tablet Take 1 tablet (500 mg total) by mouth every 12 (twelve) hours. 60 tablet 2   melatonin 5 MG TABS Take 1 tablet (5 mg total) by mouth at bedtime. 30 tablet 0   metoprolol tartrate (LOPRESSOR) 25 MG tablet Take 0.5 tablets (12.5 mg total) by mouth 2 (two) times daily. 30 tablet 0   OLANZapine (ZYPREXA) 5 MG tablet Take 1 tablet (5 mg total) by mouth at bedtime. 30 tablet 3   valbenazine (INGREZZA) 40 MG capsule Take 1 capsule (40 mg total) by mouth at bedtime. 30 capsule 2     Discharge  Medications: Please see discharge summary for a list of discharge medications.  Relevant Imaging Results:  Relevant Lab Results:   Additional Information SSN [id number]  COVID x [place], LCSW

## 2021-04-06 NOTE — ED Provider Notes (Signed)
Emergency Medicine Observation Re-evaluation Note  Karen Best is a 65 y.o. female, seen on rounds today.  Pt initially presented to the ED for complaints of Cold Exposure and Psychiatric Evaluation (/) Currently, the patient is resting  Physical Exam  BP 130/68 (BP Location: Left Arm)   Pulse 80   Temp 98 F (36.7 C) (Oral)   Resp 18   Ht 5\' 5"  (1.651 m)   Wt 71.1 kg   SpO2 96%   BMI 26.08 kg/m  Physical Exam General: NAD Cardiac: Regular rate Lungs: No respiratory distress Psych: STable  ED Course / MDM  EKG:EKG Interpretation  Date/Time:  Tuesday March 26 2021 09:45:42 EST Ventricular Rate:  76 PR Interval:  154 QRS Duration: 84 QT Interval:  380 QTC Calculation: 427 R Axis:   -71 Text Interpretation: Normal sinus rhythm Right atrial enlargement Pulmonary disease pattern Left anterior fascicular block Abnormal ECG Confirmed by Dene Gentry [phone]) on 03/26/2021 10:00:42 AM  I have reviewed the labs performed to date as well as medications administered while in observation.    Plan  Current plan is for SW assistance with placement.   CATHEY FREDENBURG is not under involuntary commitment.     Wyvonnia Dusky, MD 04/06/21 [phone]

## 2021-04-07 NOTE — Progress Notes (Signed)
TOC CSW faxed pt's information out to SNF hub, to inquire about LTC beds.  CSW uploaded clinicals to pt's [place] MUST as required for level 2 PASRR.   FL2 was sent to APS worker Rushie Chestnut as well.   Arlie Solomons.Aftan Vint, MSW, [place]  Transitions of Care Clinical Social Worker I Direct Dial: [phone]  Fax: [phone] [email or web address]

## 2021-04-07 NOTE — ED Notes (Signed)
Patient alert, calm, cooperative. No s/s of distress. Medication compliant. Patient needs assist with with ADLs.  Patient can feed self.

## 2021-04-08 NOTE — Progress Notes (Deleted)
Transition of Care (TOC) -30 day Note       Patient Details  Name: Karen Best  LKT:[id number] Date of Birth: July 11, 1955   Transition of Care [facility]) CM/SW Contact  Name: Lu Duffel [facility] Phone Number: [phone] Date:04/08/2021 Time:11:02am   MUST ID: [id number]   To Whom it May Concern:   Please be advised that the above patient will require a short-term nursing home stay, anticipated 30 days or less rehabilitation and strengthening. The plan is for return home.

## 2021-04-08 NOTE — ED Provider Notes (Signed)
Emergency Medicine Observation Re-evaluation Note  Karen Best is a 65 y.o. female, seen on rounds today.  Pt initially presented to the ED for complaints of Cold Exposure and Psychiatric Evaluation (/) Currently, the patient is sleeping.  Physical Exam  BP 128/76 (BP Location: Right Arm)   Pulse 63   Temp 98.3 F (36.8 C) (Oral)   Resp 18   Ht 5\' 5"  (1.651 m)   Wt 71.1 kg   SpO2 98%   BMI 26.08 kg/m  Physical Exam General: Sleeping Cardiac: Regular rate Lungs: No respiratory distress   ED Course / MDM  EKG:EKG Interpretation  Date/Time:  Tuesday March 26 2021 09:45:42 EST Ventricular Rate:  76 PR Interval:  154 QRS Duration: 84 QT Interval:  380 QTC Calculation: 427 R Axis:   -71 Text Interpretation: Normal sinus rhythm Right atrial enlargement Pulmonary disease pattern Left anterior fascicular block Abnormal ECG Confirmed by Dene Gentry [id number]) on 03/26/2021 10:00:42 AM  I have reviewed the labs performed to date as well as medications administered while in observation.  Recent changes in the last 24 hours include none.  Plan  Current plan is for placement.  Karen Best is not under involuntary commitment.     Blanchie Dessert, MD 04/08/21 [phone]

## 2021-04-08 NOTE — Progress Notes (Signed)
CSW attempted to contact Debbie from [place] for update, waiting for response.   Arlie Solomons.Wilver Tignor, MSW, [place]  Transitions of Care Clinical Social Worker I Direct Dial: [phone]  Fax: [phone] [email or web address]

## 2021-04-09 NOTE — ED Provider Notes (Signed)
Emergency Medicine Observation Re-evaluation Note  Karen Best is a 65 y.o. female, seen on rounds today.  Pt initially presented to the ED for complaints of Cold Exposure and Psychiatric Evaluation (/) Currently, the patient is asleep, resting comfortably.  Physical Exam  BP (!) 116/55 (BP Location: Right Arm)   Pulse 63   Temp 98.2 F (36.8 C) (Oral)   Resp 18   Ht 5\' 5"  (1.651 m)   Wt 71.1 kg   SpO2 96%   BMI 26.08 kg/m  Physical Exam Vitals and nursing note reviewed.  Constitutional:      General: She is not in acute distress.    Appearance: She is well-developed.  HENT:     Head: Normocephalic and atraumatic.  Eyes:     Conjunctiva/sclera: Conjunctivae normal.  Cardiovascular:     Rate and Rhythm: Normal rate and regular rhythm.     Heart sounds: No murmur heard. Pulmonary:     Effort: Pulmonary effort is normal. No respiratory distress.  Musculoskeletal:        General: No swelling.     Cervical back: Neck supple.  Skin:    General: Skin is warm and dry.     Capillary Refill: Capillary refill takes less than 2 seconds.  Neurological:     Mental Status: She is alert.  Psychiatric:        Mood and Affect: Mood normal.    ED Course / MDM  EKG:EKG Interpretation  Date/Time:  Tuesday March 26 2021 09:45:42 EST Ventricular Rate:  76 PR Interval:  154 QRS Duration: 84 QT Interval:  380 QTC Calculation: 427 R Axis:   -71 Text Interpretation: Normal sinus rhythm Right atrial enlargement Pulmonary disease pattern Left anterior fascicular block Abnormal ECG Confirmed by Dene Gentry [id number]) on 03/26/2021 10:00:42 AM  I have reviewed the labs performed to date as well as medications administered while in observation.  Recent changes in the last 24 hours include pending TTS and SW eval's.  Plan  Current plan is pending TTS and social work.  DALEYZA GADOMSKI is not under involuntary commitment.     Teressa Lower, MD 04/09/21 [phone]

## 2021-04-09 NOTE — ED Notes (Signed)
Patient alert this shift. Confusion noted.  Cooperative with care. Patient ate all meals. Patient needs assist with ADLs.

## 2021-04-09 NOTE — Progress Notes (Signed)
Attempted to contact debbie from [place], no answer left VM.   CSW uploaded , 30 day note to [place] [facility] as requested for  PASRR.  Arlie Solomons.Annahi Short, MSW, [place]   Transitions of Care Clinical Social Worker I Direct Dial: [phone]   Fax: [phone] [email or web address]

## 2021-04-09 NOTE — ED Provider Notes (Signed)
Note created at request of social work. Cannot cosign unassinged note.   Transition of Care (TOC) -30 day Note         Patient Details    Name: Karen Best  VEZ:[id number]  Date of Birth: December 06, 1955    Transition of Care [facility]) CM/SW Contact  Name: Lu Duffel [facility]  Phone Number: [phone]  Date:04/08/2021 Time:11:02am    MUST ID: [id number]      To Whom it May Concern:   Please be advised that the above patient will require a short-term nursing home stay, anticipated 30 days or less rehabilitation and strengthening. The plan is for return home.     Teressa Lower, MD 04/09/21 1355

## 2021-04-10 NOTE — Progress Notes (Signed)
CSW received e-mail from pt's APS worker Margaret Pyle in regards to placement.   *Caution - External email - see footer for warnings* *[facility] is full *[facility] is full  *[facility] in [place] is looking at her FL2 to determine admission  *Left a VM with [facility].     [place]  , ,    [phone]    [email or web address]   [email or web address]                           From: Talbert Forest @[email or web address]>  Sent: Wednesday, April 10, 2021 10:34 AM To: Lu Duffel @[email or web address]> Subject: [place] update thus far with SNFs  *[facility] - admin off until Monday [facility] - full *[facility] - long waitlist *[facility] - full *[facility]: A [place]: not accepting outside [place] at this time *[facility] - 1 yr waitlist *[facility] - private pay only *[facility] - full   Waiting for call back from places listed below: *[facility] @ [place] box full keep calling to follow up. *[facility] @ [place] - told me to call back today at 10:30am (about to call) *[facility] [place] @ [place]       [place]  , ,    [phone]    [email or web address]   [email or web address]

## 2021-04-10 NOTE — ED Provider Notes (Signed)
Emergency Medicine Observation Re-evaluation Note  Karen Best is a 65 y.o. female, seen on rounds today.  Pt initially presented to the ED for complaints of Cold Exposure and Psychiatric Evaluation (/) Currently, the patient is awaiting placement.  Physical Exam  BP 111/71 (BP Location: Left Arm)    Pulse 61    Temp 98.2 F (36.8 C) (Oral)    Resp 17    Ht 1.651 m (5\' 5" )    Wt 71.1 kg    SpO2 96%    BMI 26.08 kg/m  Physical Exam General: elderly resting Cardiac: normal hr an bps recorded Lungs: no distress Psych: resting  ED Course / MDM  EKG:EKG Interpretation  Date/Time:  Tuesday March 26 2021 09:45:42 EST Ventricular Rate:  76 PR Interval:  154 QRS Duration: 84 QT Interval:  380 QTC Calculation: 427 R Axis:   -71 Text Interpretation: Normal sinus rhythm Right atrial enlargement Pulmonary disease pattern Left anterior fascicular block Abnormal ECG Confirmed by Dene Gentry [phone]) on 03/26/2021 10:00:42 AM  I have reviewed the labs performed to date as well as medications administered while in observation.  Recent changes in the last 24 hours include none.  Plan  Current plan is for awaiting placement.  Karen Best is not under involuntary commitment.     Karen Boss, MD 04/10/21 [phone]

## 2021-04-10 NOTE — Progress Notes (Signed)
Physical Therapy Treatment Patient Details Name: Karen Best MRN: [id number] DOB: [birth year] Today's Date: 04/10/2021   History of Present Illness 65 year old female with prior medical history schizophrenia/bipolaras CVA, HTN detailed below presents via EMS.  Patient was reportedly found outside a local business.  She was dropped off by a vehicle.  Patient was hypothermic,    PT Comments    The patient  is cheerful, willing to ambulate. Patient incontinent  , changed by CNA.  Highly recommend that staff ambulate with patient multiple times per day to decrease burden of care and facilitate placement , patient being stronger and improved balance.    Recommendations for follow up therapy are one component of a multi-disciplinary discharge planning process, led by the attending physician.  Recommendations may be updated based on patient status, additional functional criteria and insurance authorization.  Follow Up Recommendations  No PT follow up     Assistance Recommended at Discharge Intermittent Supervision/Assistance  Equipment Recommendations  None recommended by PT    Recommendations for Other Services       Precautions / Restrictions Precautions Precautions: Fall Precaution Comments: offer her to go to BR     Mobility  Bed Mobility Overal bed mobility: Modified Independent                  Transfers   Equipment used: Rolling walker (2 wheels) Transfers: Sit to/from Stand Sit to Stand: Min guard           General transfer comment: able to push from  bed and stand at RW    Ambulation/Gait Ambulation/Gait assistance: Min guard Gait Distance (Feet): 60 Feet (x 2) Assistive device: Rolling walker (2 wheels) Gait Pattern/deviations: Step-to pattern;Step-through pattern;Decreased stride length;Shuffle;Festinating Gait velocity: decr     General Gait Details: shuffling gait ,festinating when she approaches   surfaces to sit down to.  flexed posture,  LLE externally rotated. Patient asked to sit down when she saw a chair in hall.   Stairs             Wheelchair Mobility    Modified Rankin (Stroke Patients Only)       Balance   Sitting-balance support: No upper extremity supported;Feet supported Sitting balance-Leahy Scale: Good     Standing balance support: During functional activity;Bilateral upper extremity supported;Reliant on assistive device for balance Standing balance-Leahy Scale: Poor                              Cognition Arousal/Alertness: Awake/alert Behavior During Therapy: WFL for tasks assessed/performed Overall Cognitive Status: No family/caregiver present to determine baseline cognitive functioning                                 General Comments: oriented/to [place], follows simple directions        Exercises General Exercises - Lower Extremity Long Arc Quad: AROM;Both;10 reps;Seated Hip Flexion/Marching: AROM;Seated;Both;10 reps    General Comments        Pertinent Vitals/Pain Pain Assessment: No/denies pain    Home Living                          Prior Function            PT Goals (current goals can now be found in the care plan section) Progress towards PT goals: Progressing toward goals  Frequency    Min 1X/week      PT Plan Current plan remains appropriate    Co-evaluation              AM-PAC PT "6 Clicks" Mobility   Outcome Measure  Help needed turning from your back to your side while in a flat bed without using bedrails?: None Help needed moving from lying on your back to sitting on the side of a flat bed without using bedrails?: None Help needed moving to and from a bed to a chair (including a wheelchair)?: A Little Help needed standing up from a chair using your arms (e.g., wheelchair or bedside chair)?: A Little Help needed to walk in hospital room?: A Little Help needed climbing 3-5 steps with a railing? : A  Little 6 Click Score: 20    End of Session Equipment Utilized During Treatment: Gait belt Activity Tolerance: Patient tolerated treatment well Patient left: with nursing/sitter in room;with call bell/phone within reach;in bed Nurse Communication: Mobility status PT Visit Diagnosis: Unsteadiness on feet (R26.81)     Time: 1443-1540 PT Time Calculation (min) (ACUTE ONLY): 15 min  Charges:  $Gait Training: 8-22 mins                     [place] Pager [phone] Office [phone]    Claretha Cooper 04/10/2021, 4:58 PM

## 2021-04-11 LAB — RESP PANEL BY RT-PCR (FLU A&B, COVID) ARPGX2
Influenza A by PCR: NEGATIVE
Influenza B by PCR: NEGATIVE
SARS Coronavirus 2 by RT PCR: NEGATIVE

## 2021-04-11 NOTE — ED Notes (Signed)
Patient DC d off unit to facility per provider. Patient alert, calm, and cooperative, no s/s of distress.  DC information and belongings given to [employer] for facility.  Patient off unit on stretcher, escorted and transported by [employer].

## 2021-04-11 NOTE — Progress Notes (Signed)
Pt's PASRR completed  [id number].  CSW spoke with Melissa from [facility] to confirm bed offer. Pt will need rapid COVID test prior to d/c .   Arlie Solomons.Klaira Pesci, MSW, [place]   Transitions of Care Clinical Social Worker I Direct Dial: [phone]   Fax: [phone] [email or web address]

## 2021-04-11 NOTE — Progress Notes (Signed)
Pt's will d/c to [facility] room 409A call report [phone].   Arlie Solomons.Maalik Pinn, MSW, [place]   Transitions of Care Clinical Social Worker I Direct Dial: [phone]   Fax: [phone] [email or web address]

## 2021-08-14 ENCOUNTER — Telehealth: Payer: Self-pay

## 2021-08-14 ENCOUNTER — Other Ambulatory Visit (HOSPITAL_COMMUNITY): Payer: Self-pay

## 2021-08-14 NOTE — Telephone Encounter (Signed)
[facility] Patient Advocate Encounter ? ?Insurance verification completed.   ? ?The patient is insured through [facility] Medicaid. ? ?Medication will need a PA ? ?We will continue to follow to see if copay assistance is needed. ? ?Ileene Patrick, CPhT ?Specialty Pharmacy Patient Advocate ?[facility] [facility] ?Phone: [phone] ?Fax:  [phone]  ?

## 2021-08-16 ENCOUNTER — Ambulatory Visit (INDEPENDENT_AMBULATORY_CARE_PROVIDER_SITE_OTHER): Payer: Medicaid Other | Admitting: Family

## 2021-08-16 ENCOUNTER — Encounter: Payer: Self-pay | Admitting: Family

## 2021-08-16 ENCOUNTER — Other Ambulatory Visit: Payer: Self-pay

## 2021-08-16 VITALS — BP 113/79 | HR 71 | Temp 98.7°F

## 2021-08-16 DIAGNOSIS — B182 Chronic viral hepatitis C: Secondary | ICD-10-CM | POA: Diagnosis present

## 2021-08-16 NOTE — Patient Instructions (Signed)
Nice to see you.  ? ?We will check your lab work today. ? ?Limit acetaminophen (Tylenol) usage to no more than 2 grams (2,000 mg) per day. ? ?Avoid alcohol. ? ?Do not share toothbrushes or razors. ? ?Practice safe sex to protect against transmission as well as sexually transmitted disease.  ? ? ?Hepatitis C ?Hepatitis C is a viral infection of the liver. It can lead to scarring of the liver (cirrhosis), liver failure, or liver cancer. Hepatitis C may go undetected for months or years because people with the infection may not have symptoms, or they may have only mild symptoms. ?What are the causes? ?This condition is caused by the hepatitis C virus (HCV). The virus can spread from person to person (is contagious) through: ?Blood. ?Childbirth. A woman who has hepatitis C can pass it to her baby during birth. ?Bodily fluids, such as breast milk, tears, semen, vaginal fluids, and saliva. ?Blood transfusions or organ transplants done in the [place] before 1992. ? ?What increases the risk? ?The following factors may make you more likely to develop this condition: ?Having contact with unclean (contaminated) needles or syringes. This may result from: ?Acupuncture. ?Tattoing. ?Body piercing. ?Injecting drugs. ?Having unprotected sex with someone who is infected. ?Needing treatment to filter your blood (kidney dialysis). ?Having HIV (human immunodeficiency virus) or AIDS (acquired immunodeficiency syndrome). ?Working in a job that involves contact with blood or bodily fluids, such as health care. ? ?What are the signs or symptoms? ?Symptoms of this condition include: ?Fatigue. ?Loss of appetite. ?Nausea. ?Vomiting. ?Abdominal pain. ?Dark yellow urine. ?Yellowish skin and eyes (jaundice). ?Itchy skin. ?Clay-colored bowel movements. ?Joint pain. ?Bleeding and bruising easily. ?Fluid building up in your stomach (ascites). ? ?In some cases, you may not have any symptoms. ?How is this diagnosed? ?This condition is diagnosed  with: ?Blood tests. ?Other tests to check how well your liver is functioning. They may include: ?Magnetic resonance elastography (MRE). This imaging test uses MRIs and sound waves to measure liver stiffness. ?Transient elastography. This imaging test uses ultrasounds to measure liver stiffness. ?Liver biopsy. This test requires taking a small tissue sample from your liver to examine it under a microscope. ? ?How is this treated? ?Your health care provider may perform noninvasive tests or a liver biopsy to help decide the best course of treatment. Treatment may include: ?Antiviral medicines and other medicines. ?Follow-up treatments every 6-12 months for infections or other liver conditions. ?Receiving a donated liver (liver transplant). ? ?Follow these instructions at home: ?Medicines ?Take over-the-counter and prescription medicines only as told by your health care provider. ?Take your antiviral medicine as told by your health care provider. Do not stop taking the antiviral even if you start to feel better. ?Do not take any medicines unless approved by your health care provider, including over-the-counter medicines and birth control pills. ?Activity ?Rest as needed. ?Do not have sex unless approved by your health care provider. ?Ask your health care provider when you may return to school or work. ?Eating and drinking ?Eat a balanced diet with plenty of fruits and vegetables, whole grains, and lowfat (lean) meats or non-meat proteins (such as beans or tofu). ?Drink enough fluids to keep your urine clear or pale yellow. ?Do not drink alcohol. ?General instructions ?Do not share toothbrushes, nail clippers, or razors. ?Wash your hands frequently with soap and water. If soap and water are not available, use hand sanitizer. ?Cover any cuts or open sores on your skin to prevent spreading the virus. ?Keep  all follow-up visits as told by your health care provider. This is important. You may need follow-up visits every 6-12  months. ?How is this prevented? ?There is no vaccine for hepatitis C. The only way to prevent the disease is to reduce the risk of exposure to the virus. Make sure you: ?Wash your hands frequently with soap and water. If soap and water are not available, use hand sanitizer. ?Do not share needles or syringes. ?Practice safe sex and use condoms. ?Avoid handling blood or bodily fluids without gloves or other protection. ?Avoid getting tattoos or piercings in shops or other locations that are not clean. ? ?Contact a health care provider if: ?You have a fever. ?You develop abdominal pain. ?You pass dark urine. ?You pass clay-colored stools. ?You develop joint pain. ?Get help right away if: ?You have increasing fatigue or weakness. ?You lose your appetite. ?You cannot eat or drink without vomiting. ?You develop jaundice or your jaundice gets worse. ?You bruise or bleed easily. ?Summary ?Hepatitis C is a viral infection of the liver. It can lead to scarring of the liver (cirrhosis), liver failure, or liver cancer. ?The hepatitis C virus (HCV) causes this condition. The virus can pass from person to person (is contagious). ?You should not take any medicines unless approved by your health care provider. This includes over-the-counter medicines and birth control pills. ?This information is not intended to replace advice given to you by your health care provider. Make sure you discuss any questions you have with your health care provider. ?Document Released: 04/11/2000 Document Revised: 05/20/2016 Document Reviewed: 05/20/2016 ?Elsevier Interactive Patient Education ? 2018 [place]. ? ?

## 2021-08-16 NOTE — Progress Notes (Signed)
? ?Subjective:  ? ? Patient ID: Karen Best, female    DOB: [birth year], 66 y.o.   MRN: [id number] ? ?Chief Complaint  ?Patient presents with  ? Hepatitis C  ? ? ?HPI: ? ?Karen Best is a 66 y.o. female with previous medical history of CVA, schizoaffective disorder-bipolar type, and positive hepatitis C testing presenting today for initial office visit for Hepatitis C. ? ?Karen Best does not recall when Karen Best was diagnosed with Hepatitis C. Lab work from [facility]	[place] and [place] completed on 07/29/21 showed a positive Hepatitis C RNA level of 18.8 million with Genotype 1b. Risk factor for Hepatitis C is age. Denies any previous drug use, tattoos, or blood transfusions. No personal or family history of liver disease. Has not received any treatment. No current symptoms and denies abdominal pain, nausea, vomiting, scleral icterus, jaundice or fatigue.  ? ? ?Allergies  ?Allergen Reactions  ? Haloperidol And Related   ?  Extrapyramidal symptoms  ? ? ? ? ?Outpatient Medications Prior to Visit  ?Medication Sig Dispense Refill  ? acetaminophen (TYLENOL) 325 MG tablet Take 650 mg by mouth every 6 (six) hours as needed.    ? atorvastatin (LIPITOR) 10 MG tablet Take 10 mg by mouth daily.    ? divalproex (DEPAKOTE) 500 MG DR tablet Take 1 tablet (500 mg total) by mouth every 12 (twelve) hours. 60 tablet 2  ? docusate sodium (COLACE) 100 MG capsule Take 100 mg by mouth daily.    ? hydrOXYzine (ATARAX) 25 MG tablet Take 25 mg by mouth every 8 (eight) hours as needed for itching.    ? OLANZapine (ZYPREXA) 2.5 MG tablet Take 2.5 mg by mouth at bedtime.    ? senna (SENOKOT) 8.6 MG tablet Take 1 tablet by mouth daily.    ? clonazePAM (KLONOPIN) 0.5 MG tablet Take 0.5 mg by mouth 2 (two) times daily as needed for anxiety. (Patient not taking: Reported on 08/16/2021)    ? melatonin 5 MG TABS Take 1 tablet (5 mg total) by mouth at bedtime. (Patient not taking: Reported on 08/16/2021) 30 tablet 0  ? metoprolol  tartrate (LOPRESSOR) 25 MG tablet Take 0.5 tablets (12.5 mg total) by mouth 2 (two) times daily. (Patient not taking: Reported on 08/16/2021) 30 tablet 0  ? OLANZapine (ZYPREXA) 5 MG tablet Take 1 tablet (5 mg total) by mouth at bedtime. (Patient not taking: Reported on 08/16/2021) 30 tablet 3  ? valbenazine (INGREZZA) 40 MG capsule Take 1 capsule (40 mg total) by mouth at bedtime. (Patient not taking: Reported on 08/16/2021) 30 capsule 2  ? ?No facility-administered medications prior to visit.  ? ? ? ?Past Medical History:  ?Diagnosis Date  ? Asthma   ? Bipolar 1 disorder ([place])   ? Fibromyalgia   ? Hypertension   ? Schizophrenia ([place])   ? Tobacco abuse   ? ? ? ? ?Past Surgical History:  ?Procedure Laterality Date  ? CESAREAN SECTION    ? TUBAL LIGATION    ? ? ? ? ?Family History  ?Problem Relation Age of Onset  ? Liver cancer Neg Hx   ? Liver disease Neg Hx   ? ? ? ? ?Social History  ? ?Socioeconomic History  ? Marital status: Legally Separated  ?  Spouse name: Not on file  ? Number of children: Not on file  ? Years of education: Not on file  ? Highest education level: Not on file  ?Occupational History  ? Not on file  ?  Tobacco Use  ? Smoking status: Every Day  ?  Packs/day: 0.50  ?  Types: Cigarettes  ? Smokeless tobacco: Former  ?Vaping Use  ? Vaping Use: Never used  ?Substance and Sexual Activity  ? Alcohol use: Yes  ?  Comment: occ  ? Drug use: Yes  ?  Types: Cocaine  ?  Comment: crack cocaine (last use 10/25/2018)  ? Sexual activity: Yes  ?  Birth control/protection: None  ?Other Topics Concern  ? Not on file  ?Social History Narrative  ? Not on file  ? ?Social Determinants of Health  ? ?Financial Resource Strain: Not on file  ?Food Insecurity: Not on file  ?Transportation Needs: Not on file  ?Physical Activity: Not on file  ?Stress: Not on file  ?Social Connections: Not on file  ?Intimate Partner Violence: Not on file  ? ? ? ? ?Review of Systems  ?Constitutional:  Negative for chills, fatigue, fever and  unexpected weight change.  ?Respiratory:  Negative for cough, chest tightness, shortness of breath and wheezing.   ?Cardiovascular:  Negative for chest pain and leg swelling.  ?Gastrointestinal:  Negative for abdominal distention, constipation, diarrhea, nausea and vomiting.  ?Neurological:  Negative for dizziness, weakness, light-headedness and headaches.  ?Hematological:  Does not bruise/bleed easily.  ?   ? ?Objective:  ?  ?BP 113/79   Pulse 71   Temp 98.7 ?F (37.1 ?C) (Temporal)   SpO2 100%  ?Nursing note and vital signs reviewed. ? ?Physical Exam ?Constitutional:   ?   General: Karen Best is not in acute distress. ?   Appearance: Karen Best is well-developed.  ?Cardiovascular:  ?   Rate and Rhythm: Normal rate and regular rhythm.  ?   Heart sounds: Normal heart sounds. No murmur heard. ?  No friction rub. No gallop.  ?Pulmonary:  ?   Effort: Pulmonary effort is normal. No respiratory distress.  ?   Breath sounds: Normal breath sounds. No wheezing or rales.  ?Chest:  ?   Chest wall: No tenderness.  ?Abdominal:  ?   General: Bowel sounds are normal. There is no distension.  ?   Palpations: Abdomen is soft. There is no mass.  ?   Tenderness: There is no abdominal tenderness. There is no guarding or rebound.  ?Skin: ?   General: Skin is warm and dry.  ?Neurological:  ?   Mental Status: Karen Best is alert and oriented to person, place, and time.  ?Psychiatric:     ?   Behavior: Behavior normal.     ?   Thought Content: Thought content normal.     ?   Judgment: Judgment normal.  ? ? ? ? ? ?   ? ?Assessment & Plan:  ? ?Patient Active Problem List  ? Diagnosis Date Noted  ? Valproic acid toxicity 02/22/2021  ? Malnutrition of moderate degree 11/10/2020  ? Hypertension   ? Acute encephalopathy 09/18/2020  ? Schizoaffective disorder, bipolar type ([place]) 05/13/2020  ? Bacteremia due to Staphylococcus aureus 10/03/2019  ? AKI (acute kidney injury) ([place]) 10/02/2019  ? Transaminitis 10/02/2019  ? CVA (cerebral vascular accident) ([place])  10/02/2019  ? AMS (altered mental status) 10/01/2019  ? Lithium toxicity 02/16/2019  ? Hyperkalemia 02/16/2019  ? Acute confusion 12/29/2018  ? Toxic metabolic encephalopathy 52/84/1324  ? Asthma 12/23/2018  ? Delirium due to another medical condition   ? Acute metabolic encephalopathy 40/01/2724  ? Chronic hepatitis C without hepatic coma ([place]) 06/11/2018  ? ? ? ?Problem List Items Addressed This  Visit   ? ?  ? Digestive  ? Chronic hepatitis C without hepatic coma (HCC) - Primary  ?  Karen Best is a 66 y/o female with Genotype 1b chronic Hepatitis C with primary risk factor of age (being born between 77-1964). Asymptomatic and treatment naive. We discussed the pathogenesis, transmission, risks if left untreated, treatment options, and financial assistance along with plan of care. Check Hepatitis B, HIV and Hepatitis C lab work today. Medication and duration pending lab work results. Plan for follow up in 1 month after starting medication.  ? ?  ?  ? Relevant Orders  ? Basic metabolic panel  ? Hepatitis B surface antibody,quantitative  ? Hepatitis B surface antigen  ? Hepatic function panel  ? CBC  ? HIV Antibody (routine testing w rflx)  ? Liver Fibrosis, FibroTest-ActiTest  ? Protime-INR  ? ? ? ?I am having Karen Best maintain her metoprolol tartrate, melatonin, clonazePAM, OLANZapine, valbenazine, divalproex, atorvastatin, docusate sodium, hydrOXYzine, senna, acetaminophen, and OLANZapine. ? ? ?Follow-up:  1 month after starting medication or sooner if needed.  ? ? ? ?Terri Piedra, MSN, FNP-C ?Nurse Practitioner ?[place] [facility] ?[facility] ?[facility] Main number: [phone] ? ?

## 2021-08-16 NOTE — Assessment & Plan Note (Signed)
Karen Best is a 66 y/o female with Genotype 1b chronic Hepatitis C with primary risk factor of age (being born between 44-1964). Asymptomatic and treatment naive. We discussed the pathogenesis, transmission, risks if left untreated, treatment options, and financial assistance along with plan of care. Check Hepatitis B, HIV and Hepatitis C lab work today. Medication and duration pending lab work results. Plan for follow up in 1 month after starting medication.  ?

## 2021-08-22 LAB — LIVER FIBROSIS, FIBROTEST-ACTITEST
ALT: 13 U/L (ref 6–29)
Alpha-2-Macroglobulin: 363 mg/dL — ABNORMAL HIGH (ref 106–279)
Apolipoprotein A1: 163 mg/dL (ref 101–198)
Bilirubin: 0.3 mg/dL (ref 0.2–1.2)
Fibrosis Score: 0.45
GGT: 40 U/L (ref 3–65)
Haptoglobin: 88 mg/dL (ref 43–212)
Necroinflammat ACT Score: 0.05
Reference ID: 4338773

## 2021-08-22 LAB — HEPATIC FUNCTION PANEL
AG Ratio: 0.8 (calc) — ABNORMAL LOW (ref 1.0–2.5)
ALT: 13 U/L (ref 6–29)
AST: 19 U/L (ref 10–35)
Albumin: 3.4 g/dL — ABNORMAL LOW (ref 3.6–5.1)
Alkaline phosphatase (APISO): 65 U/L (ref 37–153)
Bilirubin, Direct: 0.1 mg/dL (ref 0.0–0.2)
Globulin: 4.1 g/dL (calc) — ABNORMAL HIGH (ref 1.9–3.7)
Indirect Bilirubin: 0.2 mg/dL (calc) (ref 0.2–1.2)
Total Bilirubin: 0.3 mg/dL (ref 0.2–1.2)
Total Protein: 7.5 g/dL (ref 6.1–8.1)

## 2021-08-22 LAB — BASIC METABOLIC PANEL
BUN: 10 mg/dL (ref 7–25)
CO2: 26 mmol/L (ref 20–32)
Calcium: 9.4 mg/dL (ref 8.6–10.4)
Chloride: 106 mmol/L (ref 98–110)
Creat: 0.76 mg/dL (ref 0.50–1.05)
Glucose, Bld: 73 mg/dL (ref 65–99)
Potassium: 4.4 mmol/L (ref 3.5–5.3)
Sodium: 139 mmol/L (ref 135–146)

## 2021-08-22 LAB — HIV ANTIBODY (ROUTINE TESTING W REFLEX): HIV 1&2 Ab, 4th Generation: NONREACTIVE

## 2021-08-22 LAB — CBC
HCT: 41.6 % (ref 35.0–45.0)
Hemoglobin: 13 g/dL (ref 11.7–15.5)
MCH: 24.5 pg — ABNORMAL LOW (ref 27.0–33.0)
MCHC: 31.3 g/dL — ABNORMAL LOW (ref 32.0–36.0)
MCV: 78.5 fL — ABNORMAL LOW (ref 80.0–100.0)
MPV: 11.4 fL (ref 7.5–12.5)
Platelets: 190 10*3/uL (ref 140–400)
RBC: 5.3 10*6/uL — ABNORMAL HIGH (ref 3.80–5.10)
RDW: 14.4 % (ref 11.0–15.0)
WBC: 5.4 10*3/uL (ref 3.8–10.8)

## 2021-08-22 LAB — PROTIME-INR
INR: 1
Prothrombin Time: 10.2 s (ref 9.0–11.5)

## 2021-08-22 LAB — HEPATITIS B SURFACE ANTIBODY, QUANTITATIVE: Hep B S AB Quant (Post): >1000 m[IU]/mL (ref >=10)

## 2021-08-22 LAB — HEPATITIS B SURFACE ANTIGEN: Hepatitis B Surface Ag: NONREACTIVE

## 2021-08-27 ENCOUNTER — Other Ambulatory Visit (HOSPITAL_COMMUNITY): Payer: Self-pay

## 2021-08-27 ENCOUNTER — Telehealth: Payer: Self-pay | Admitting: Family

## 2021-08-27 NOTE — Telephone Encounter (Signed)
Karen Best has Genotype 1b Chronic Hepatitis C with initial viral load of 18.8 million and Fibrosis score of F1-F2 with Fib4 1.78 and APRI 0.250 which is low risk for significant fibrosis. Will proceed with 8 weeks of [place]. Attempted to speak with Karen Best and message was left with her nurse at her facility.  ? ?Terri Piedra, NP ?08/27/2021 ?4:29 PM ? ?

## 2021-08-27 NOTE — Telephone Encounter (Signed)
Hello Karen Best,  ?He have Medicaid I will start PA and you will be able to send the script to [facility].

## 2021-08-28 ENCOUNTER — Other Ambulatory Visit (HOSPITAL_COMMUNITY): Payer: Self-pay

## 2021-08-28 ENCOUNTER — Telehealth: Payer: Self-pay

## 2021-08-28 NOTE — Telephone Encounter (Signed)
[facility] Patient Advocate Encounter ?  ?Received notification from [facility] Medicaid that prior authorization for [place] is required. ?  ?PA submitted on 08/28/21 ?Key [id number] W ?Status is pending ?   ?[facility] Clinic will continue to follow. ? ? ?Ileene Patrick, CPhT ?Specialty Pharmacy Patient Advocate ?[facility] ?Phone: [phone] ?Fax:  [phone]  ?

## 2021-08-29 ENCOUNTER — Other Ambulatory Visit: Payer: Self-pay | Admitting: Pharmacist

## 2021-08-29 ENCOUNTER — Telehealth: Payer: Self-pay

## 2021-08-29 ENCOUNTER — Other Ambulatory Visit (HOSPITAL_COMMUNITY): Payer: Self-pay

## 2021-08-29 DIAGNOSIS — B182 Chronic viral hepatitis C: Secondary | ICD-10-CM

## 2021-08-29 MED ORDER — MAVYRET 100-40 MG PO TABS
3.0000 | ORAL_TABLET | Freq: Every day | ORAL | 1 refills | Status: DC
  Filled 2021-08-29: qty 84, fill #0
  Filled 2021-09-02: qty 84, 28d supply, fill #0
  Filled 2021-10-01: qty 84, 28d supply, fill #1

## 2021-08-29 NOTE — Telephone Encounter (Signed)
[facility] Patient Advocate Encounter ? ?Prior Authorization for Delfino Lov[place] has been approved.   ? ?PA# [id number] W ?Effective dates: 08/29/21 through 11/18/21 ? ?Patients co-pay is $0.00.  ? ?Prescription can be sent to [facility] ? ?[facility] Clinic will continue to follow. ? ?Ileene Patrick, CPhT ?Specialty Pharmacy Patient Advocate ?[facility] ?Phone: [phone] ?Fax:  [phone]  ?

## 2021-08-30 ENCOUNTER — Other Ambulatory Visit (HOSPITAL_COMMUNITY): Payer: Self-pay

## 2021-09-02 ENCOUNTER — Other Ambulatory Visit (HOSPITAL_COMMUNITY): Payer: Self-pay

## 2021-09-03 NOTE — Telephone Encounter (Signed)
Patient is at [place] called regarding medication and I informed them it was at [facility].  She stated they get their medication from another vendor. Her number is [phone]. Please advise.  Thanks! ?

## 2021-09-05 NOTE — Telephone Encounter (Signed)
LVM with [name] to review HCV meds. Karen Best will need to hold lipitor while taking Mavyret and start Crestor '10mg'$  once daily throughout treatment per [name]. Estill Bamberg

## 2021-09-13 ENCOUNTER — Telehealth: Payer: Self-pay

## 2021-09-13 NOTE — Telephone Encounter (Signed)
Spoke with the patient's nurse about drug-drug interactions with her [place]. She is prescribed atorvastatin 10 mg for secondary prevention; however, this has an increased concentration and risk of myalgias. Suggested she switch to rosuvastatin 10 mg which she was amenable to.   Varney Daily, PharmD PGY1 Pharmacy Resident  Please check AMION for all [facility] pharmacy phone numbers After 10:00 PM call main pharmacy [phone]

## 2021-09-20 ENCOUNTER — Other Ambulatory Visit (HOSPITAL_COMMUNITY): Payer: Self-pay

## 2021-10-01 ENCOUNTER — Other Ambulatory Visit (HOSPITAL_COMMUNITY): Payer: Self-pay

## 2021-10-02 ENCOUNTER — Other Ambulatory Visit: Payer: Self-pay

## 2021-10-02 ENCOUNTER — Ambulatory Visit (INDEPENDENT_AMBULATORY_CARE_PROVIDER_SITE_OTHER): Payer: Medicare Other | Admitting: Pharmacist

## 2021-10-02 DIAGNOSIS — B182 Chronic viral hepatitis C: Secondary | ICD-10-CM | POA: Diagnosis not present

## 2021-10-02 NOTE — Progress Notes (Signed)
10/02/2021  HPI: CIA GARRETSON is a 66 y.o. female who presents to the [facility] for Hepatitis C follow-up.  Medication: Mavyret x 8 weeks  Start Date: 09/03/21  Hepatitis C Genotype: 1b  Fibrosis Score: F1-F2  Hepatitis C RNA: 18.8 million 07/29/21  Patient Active Problem List   Diagnosis Date Noted   Valproic acid toxicity 02/22/2021   Malnutrition of moderate degree 11/10/2020   Hypertension    Acute encephalopathy 09/18/2020   Schizoaffective disorder, bipolar type ([place]) 05/13/2020   Bacteremia due to Staphylococcus aureus 10/03/2019   AKI (acute kidney injury) ([place]) 10/02/2019   Transaminitis 10/02/2019   CVA (cerebral vascular accident) ([place]) 10/02/2019   AMS (altered mental status) 10/01/2019   Lithium toxicity 02/16/2019   Hyperkalemia 02/16/2019   Acute confusion 67/20/9470   Toxic metabolic encephalopathy 96/28/3662   Asthma 12/23/2018   Delirium due to another medical condition    Acute metabolic encephalopathy 94/76/5465   Chronic hepatitis C without hepatic coma ([place]) 06/11/2018    Patient's Medications  New Prescriptions   No medications on file  Previous Medications   ACETAMINOPHEN (TYLENOL) 325 MG TABLET    Take 650 mg by mouth every 6 (six) hours as needed.   ATORVASTATIN (LIPITOR) 10 MG TABLET    Take 10 mg by mouth daily.   CLONAZEPAM (KLONOPIN) 0.5 MG TABLET    Take 0.5 mg by mouth 2 (two) times daily as needed for anxiety.   DIVALPROEX (DEPAKOTE) 500 MG DR TABLET    Take 1 tablet (500 mg total) by mouth every 12 (twelve) hours.   DOCUSATE SODIUM (COLACE) 100 MG CAPSULE    Take 100 mg by mouth daily.   GLECAPREVIR-PIBRENTASVIR (MAVYRET) 100-40 MG TABS    Take 3 tablets by mouth daily with breakfast.   HYDROXYZINE (ATARAX) 25 MG TABLET    Take 25 mg by mouth every 8 (eight) hours as needed for itching.   MELATONIN 5 MG TABS    Take 1 tablet (5 mg total) by mouth at bedtime.   METOPROLOL TARTRATE (LOPRESSOR) 25 MG TABLET    Take 0.5  tablets (12.5 mg total) by mouth 2 (two) times daily.   OLANZAPINE (ZYPREXA) 2.5 MG TABLET    Take 2.5 mg by mouth at bedtime.   OLANZAPINE (ZYPREXA) 5 MG TABLET    Take 1 tablet (5 mg total) by mouth at bedtime.   SENNA (SENOKOT) 8.6 MG TABLET    Take 1 tablet by mouth daily.   VALBENAZINE (INGREZZA) 40 MG CAPSULE    Take 1 capsule (40 mg total) by mouth at bedtime.  Modified Medications   No medications on file  Discontinued Medications   No medications on file    Allergies: Allergies  Allergen Reactions   Haloperidol And Related     Extrapyramidal symptoms    Past Medical History: Past Medical History:  Diagnosis Date   Asthma    Bipolar 1 disorder ([place])    Fibromyalgia    Hypertension    Schizophrenia ([place])    Tobacco abuse     Social History: Social History   Socioeconomic History   Marital status: Legally Separated    Spouse name: Not on file   Number of children: Not on file   Years of education: Not on file   Highest education level: Not on file  Occupational History   Not on file  Tobacco Use   Smoking status: Every Day    Packs/day: 0.50    Types:  Cigarettes   Smokeless tobacco: Former  Scientific laboratory technician Use: Never used  Substance and Sexual Activity   Alcohol use: Yes    Comment: occ   Drug use: Yes    Types: Cocaine    Comment: crack cocaine (last use 10/25/2018)   Sexual activity: Yes    Birth control/protection: None  Other Topics Concern   Not on file  Social History Narrative   Not on file   Social Determinants of Health   Financial Resource Strain: Not on file  Food Insecurity: Not on file  Transportation Needs: Not on file  Physical Activity: Not on file  Stress: Not on file  Social Connections: Not on file    Labs: Hepatitis C Lab Results  Component Value Date   HCVGENOTYPE 1b 06/15/2018   HCVRNAPCRQN 6,420,000 (H) 06/08/2018   FIBROSTAGE F1-F2 08/16/2021   Hepatitis B Lab Results  Component Value Date   HEPBSAB  REACTIVE (A) 06/15/2018   HEPBSAG NON-REACTIVE 08/16/2021   HEPBCAB NON-REACTIVE 06/15/2018   Hepatitis A Lab Results  Component Value Date   HAV REACTIVE (A) 06/15/2018   HIV Lab Results  Component Value Date   HIV NON-REACTIVE 08/16/2021   HIV Non Reactive 03/26/2021   HIV Non Reactive 11/08/2020   HIV Non Reactive 09/18/2020   HIV Non Reactive 09/12/2019   Lab Results  Component Value Date   CREATININE 0.76 08/16/2021   CREATININE 0.92 03/26/2021   CREATININE 0.84 02/24/2021   CREATININE 0.73 02/23/2021   CREATININE 0.88 02/22/2021   Lab Results  Component Value Date   AST 19 08/16/2021   AST 28 03/26/2021   AST 19 02/22/2021   ALT 13 08/16/2021   ALT 13 08/16/2021   ALT 20 03/26/2021   INR 1.0 08/16/2021   INR 0.9 02/22/2021   INR 1.0 11/07/2020    Assessment: Niyati comes in today with her nurse accompanying her. By her report (patient does not say much), Makeba is tolerating the [place] well. I reviewed the paperwork she brought in and she is taking all three tablets together in the morning. She has also switched to Crestor 10 mg as well. No reports of missed doses or issues. I provided them with their 2nd and final box and wrote a note on her paperwork to continue for 4 more weeks. Will check a RNA today and have her see [name] in 4 months for her cure visit.   Plan: - Finish 4 more weeks of Mavyret - Hep C RNA today - F/u with Greg on 10/12 at 930am  Dejan Angert L. Shavy Beachem, PharmD, BCIDP, AAHIVP, CPP Clinical Pharmacist Practitioner Infectious Diseases [place] [facility] 10/02/2021, 9:11 AM

## 2021-10-03 ENCOUNTER — Telehealth: Payer: Self-pay

## 2021-10-03 NOTE — Telephone Encounter (Signed)
[facility] Patient Advocate Encounter  Patient's medications have been couriered to [facility] from [place] and will be picked up 10/02/21.  2nd box of [place] , [place] Patient [facility] for Infectious Disease Phone: [phone] Fax:  [phone]

## 2021-10-04 LAB — HEPATITIS C RNA QUANTITATIVE
HCV Quantitative Log: 1.25 log IU/mL — ABNORMAL HIGH
HCV RNA, PCR, QN: 18 IU/mL — ABNORMAL HIGH

## 2021-10-08 ENCOUNTER — Other Ambulatory Visit (HOSPITAL_COMMUNITY): Payer: Self-pay

## 2021-10-21 ENCOUNTER — Other Ambulatory Visit (HOSPITAL_COMMUNITY): Payer: Self-pay

## 2021-10-23 ENCOUNTER — Other Ambulatory Visit (HOSPITAL_COMMUNITY): Payer: Self-pay

## 2022-02-06 ENCOUNTER — Ambulatory Visit (INDEPENDENT_AMBULATORY_CARE_PROVIDER_SITE_OTHER): Payer: Medicare Other | Admitting: Family

## 2022-02-06 ENCOUNTER — Encounter: Payer: Self-pay | Admitting: Family

## 2022-02-06 ENCOUNTER — Other Ambulatory Visit: Payer: Self-pay

## 2022-02-06 VITALS — BP 164/98 | HR 120 | Temp 97.5°F | Resp 16 | Ht 65.0 in | Wt 156.0 lb

## 2022-02-06 DIAGNOSIS — B182 Chronic viral hepatitis C: Secondary | ICD-10-CM | POA: Diagnosis present

## 2022-02-06 NOTE — Patient Instructions (Addendum)
Nice to see you.  We will check your lab work today.  No further liver cancer screening is needed.  If you need to be tested for Hepatitis C in the future a Hepatitis C RNA level or viral load will need to be checked as the Hepatitis C antibody used for initial screening will always be positive.   Have a great day and stay safe!  

## 2022-02-06 NOTE — Assessment & Plan Note (Signed)
Ms. Fojtik has completed treatment for chronic hepatitis C with 8 weeks of [place] with no adverse side effects and good adherence.  Check hepatitis C RNA level today for sustained viremic response.  She is at low risk for advanced fibrosis and no additional hepatocellular carcinoma screenings are indicated.  Discussed if hepatitis C screening is needed in the future will need to check hepatitis C RNA level as hepatitis C antibody will always remain positive.  Follow-up as needed pending lab work results.

## 2022-02-06 NOTE — Progress Notes (Signed)
Subjective:    Patient ID: Karen Best, female    DOB: February 08, 1956, 66 y.o.   MRN: [id number]  No chief complaint on file.   HPI:  Karen Best is a 66 y.o. female with chronic hepatitis C last seen by Magda Kiel, PharmD, CPP on 10/02/2021 for follow-up after first month of [place].  Switch to Crestor while taking Mavyret.  Had good adherence and tolerance with viral load of 18.  Here today for cure visit and follow-up.  Karen Best is here with a representative from her care facility. Has been doing well since her last office visit and completed medication as prescribed. Denies abdominal pain, nausea, vomiting, fatigue, fever, scleral icterus or jaundice.     Allergies  Allergen Reactions   Haloperidol And Related     Extrapyramidal symptoms      Outpatient Medications Prior to Visit  Medication Sig Dispense Refill   acetaminophen (TYLENOL) 325 MG tablet Take 650 mg by mouth every 6 (six) hours as needed.     atorvastatin (LIPITOR) 10 MG tablet Take 10 mg by mouth daily.     clonazePAM (KLONOPIN) 0.5 MG tablet Take 0.5 mg by mouth 2 (two) times daily as needed for anxiety. (Patient not taking: Reported on 08/16/2021)     divalproex (DEPAKOTE) 500 MG DR tablet Take 1 tablet (500 mg total) by mouth every 12 (twelve) hours. 60 tablet 2   docusate sodium (COLACE) 100 MG capsule Take 100 mg by mouth daily.     hydrOXYzine (ATARAX) 25 MG tablet Take 25 mg by mouth every 8 (eight) hours as needed for itching.     melatonin 5 MG TABS Take 1 tablet (5 mg total) by mouth at bedtime. (Patient not taking: Reported on 08/16/2021) 30 tablet 0   metoprolol tartrate (LOPRESSOR) 25 MG tablet Take 0.5 tablets (12.5 mg total) by mouth 2 (two) times daily. (Patient not taking: Reported on 08/16/2021) 30 tablet 0   OLANZapine (ZYPREXA) 2.5 MG tablet Take 2.5 mg by mouth at bedtime.     OLANZapine (ZYPREXA) 5 MG tablet Take 1 tablet (5 mg total) by mouth at bedtime. (Patient not taking:  Reported on 08/16/2021) 30 tablet 3   senna (SENOKOT) 8.6 MG tablet Take 1 tablet by mouth daily.     valbenazine (INGREZZA) 40 MG capsule Take 1 capsule (40 mg total) by mouth at bedtime. (Patient not taking: Reported on 08/16/2021) 30 capsule 2   Glecaprevir-Pibrentasvir (MAVYRET) 100-40 MG TABS Take 3 tablets by mouth daily with breakfast. 84 tablet 1   No facility-administered medications prior to visit.     Past Medical History:  Diagnosis Date   Asthma    Bipolar 1 disorder ([place])    Fibromyalgia    Hypertension    Schizophrenia ([place])    Tobacco abuse      Past Surgical History:  Procedure Laterality Date   CESAREAN SECTION     TUBAL LIGATION         Review of Systems  Constitutional:  Negative for chills, fatigue, fever and unexpected weight change.  Respiratory:  Negative for cough, chest tightness, shortness of breath and wheezing.   Cardiovascular:  Negative for chest pain and leg swelling.  Gastrointestinal:  Negative for abdominal distention, constipation, diarrhea, nausea and vomiting.  Neurological:  Negative for dizziness, weakness, light-headedness and headaches.  Hematological:  Does not bruise/bleed easily.      Objective:    BP (!) 164/98   Pulse (!) 120   Temp (!)  97.5 F (36.4 C)   Resp 16   Ht '5\' 5"'$  (1.651 m)   Wt 156 lb (70.8 kg)   SpO2 95%   BMI 25.96 kg/m  Nursing note and vital signs reviewed.  Physical Exam Constitutional:      General: She is not in acute distress.    Appearance: She is well-developed.  Cardiovascular:     Rate and Rhythm: Normal rate and regular rhythm.     Heart sounds: Normal heart sounds. No murmur heard.    No friction rub. No gallop.  Pulmonary:     Effort: Pulmonary effort is normal. No respiratory distress.     Breath sounds: Normal breath sounds. No wheezing or rales.  Chest:     Chest wall: No tenderness.  Abdominal:     General: Bowel sounds are normal. There is no distension.     Palpations:  Abdomen is soft. There is no mass.     Tenderness: There is no abdominal tenderness. There is no guarding or rebound.  Skin:    General: Skin is warm and dry.  Neurological:     Mental Status: She is alert and oriented to person, place, and time.  Psychiatric:        Behavior: Behavior normal.        Thought Content: Thought content normal.        Judgment: Judgment normal.         02/06/2022    9:21 AM  Depression screen PHQ 2/9  Decreased Interest 0  Down, Depressed, Hopeless 0  PHQ - 2 Score 0       Assessment & Plan:    Patient Active Problem List   Diagnosis Date Noted   Valproic acid toxicity 02/22/2021   Malnutrition of moderate degree 11/10/2020   Hypertension    Acute encephalopathy 09/18/2020   Schizoaffective disorder, bipolar type ([place]) 05/13/2020   Bacteremia due to Staphylococcus aureus 10/03/2019   AKI (acute kidney injury) ([place]) 10/02/2019   Transaminitis 10/02/2019   CVA (cerebral vascular accident) ([place]) 10/02/2019   AMS (altered mental status) 10/01/2019   Lithium toxicity 02/16/2019   Hyperkalemia 02/16/2019   Acute confusion 28/36/6294   Toxic metabolic encephalopathy 76/54/6503   Asthma 12/23/2018   Delirium due to another medical condition    Acute metabolic encephalopathy 54/65/6812   Chronic hepatitis C without hepatic coma ([place]) 06/11/2018     Problem List Items Addressed This Visit       Digestive   Chronic hepatitis C without hepatic coma ([place]) - Primary    Karen Best has completed treatment for chronic hepatitis C with 8 weeks of [place] with no adverse side effects and good adherence.  Check hepatitis C RNA level today for sustained viremic response.  She is at low risk for advanced fibrosis and no additional hepatocellular carcinoma screenings are indicated.  Discussed if hepatitis C screening is needed in the future will need to check hepatitis C RNA level as hepatitis C antibody will always remain positive.  Follow-up as needed  pending lab work results.      Relevant Orders   Hepatitis C RNA quantitative     I have discontinued Karen Best Mavyret. I am also having her maintain her metoprolol tartrate, melatonin, clonazePAM, OLANZapine, valbenazine, divalproex, atorvastatin, docusate sodium, hydrOXYzine, senna, acetaminophen, and OLANZapine.   Follow-up: Return if symptoms worsen or fail to improve.   Terri Piedra, MSN, FNP-C Nurse Practitioner [facility] for Infectious Disease [facility]  Main number: [phone]

## 2022-02-08 LAB — HEPATITIS C RNA QUANTITATIVE
HCV Quantitative Log: <1.18 log IU/mL
HCV RNA, PCR, QN: <15 IU/mL

## 2022-07-15 ENCOUNTER — Emergency Department (HOSPITAL_COMMUNITY): Payer: Medicare Other

## 2022-07-15 ENCOUNTER — Other Ambulatory Visit: Payer: Self-pay

## 2022-07-15 ENCOUNTER — Observation Stay (HOSPITAL_COMMUNITY)
Admission: EM | Admit: 2022-07-15 | Discharge: 2022-07-16 | Disposition: A | Discharge status: Skilled Nursing Facility | Payer: Medicare Other | Attending: Family Medicine | Admitting: Family Medicine

## 2022-07-15 ENCOUNTER — Encounter (HOSPITAL_COMMUNITY): Payer: Self-pay | Admitting: Radiology

## 2022-07-15 DIAGNOSIS — E785 Hyperlipidemia, unspecified: Secondary | ICD-10-CM | POA: Diagnosis not present

## 2022-07-15 DIAGNOSIS — I10 Essential (primary) hypertension: Secondary | ICD-10-CM | POA: Diagnosis not present

## 2022-07-15 DIAGNOSIS — Z79899 Other long term (current) drug therapy: Secondary | ICD-10-CM | POA: Insufficient documentation

## 2022-07-15 DIAGNOSIS — I700 Atherosclerosis of aorta: Secondary | ICD-10-CM | POA: Insufficient documentation

## 2022-07-15 DIAGNOSIS — F17210 Nicotine dependence, cigarettes, uncomplicated: Secondary | ICD-10-CM | POA: Insufficient documentation

## 2022-07-15 DIAGNOSIS — R4182 Altered mental status, unspecified: Secondary | ICD-10-CM | POA: Insufficient documentation

## 2022-07-15 DIAGNOSIS — J45909 Unspecified asthma, uncomplicated: Secondary | ICD-10-CM | POA: Diagnosis not present

## 2022-07-15 DIAGNOSIS — G9341 Metabolic encephalopathy: Principal | ICD-10-CM | POA: Insufficient documentation

## 2022-07-15 DIAGNOSIS — E782 Mixed hyperlipidemia: Secondary | ICD-10-CM | POA: Insufficient documentation

## 2022-07-15 DIAGNOSIS — K5900 Constipation, unspecified: Secondary | ICD-10-CM | POA: Diagnosis not present

## 2022-07-15 DIAGNOSIS — F250 Schizoaffective disorder, bipolar type: Secondary | ICD-10-CM | POA: Diagnosis present

## 2022-07-15 DIAGNOSIS — G9340 Encephalopathy, unspecified: Secondary | ICD-10-CM

## 2022-07-15 DIAGNOSIS — R29818 Other symptoms and signs involving the nervous system: Secondary | ICD-10-CM | POA: Diagnosis present

## 2022-07-15 LAB — CBC
HCT: 48.9 % — ABNORMAL HIGH (ref 36.0–46.0)
Hemoglobin: 15.1 g/dL — ABNORMAL HIGH (ref 12.0–15.0)
MCH: 23.8 pg — ABNORMAL LOW (ref 26.0–34.0)
MCHC: 30.9 g/dL (ref 30.0–36.0)
MCV: 77 fL — ABNORMAL LOW (ref 80.0–100.0)
Platelets: 180 10*3/uL (ref 150–400)
RBC: 6.35 MIL/uL — ABNORMAL HIGH (ref 3.87–5.11)
RDW: 17.5 % — ABNORMAL HIGH (ref 11.5–15.5)
WBC: 6 10*3/uL (ref 4.0–10.5)
nRBC: 0 % (ref 0.0–0.2)

## 2022-07-15 LAB — DIFFERENTIAL
Abs Immature Granulocytes: 0.04 10*3/uL (ref 0.00–0.07)
Basophils Absolute: 0 10*3/uL (ref 0.0–0.1)
Basophils Relative: 0 %
Eosinophils Absolute: 0.1 10*3/uL (ref 0.0–0.5)
Eosinophils Relative: 2 %
Immature Granulocytes: 1 %
Lymphocytes Relative: 57 %
Lymphs Abs: 3.4 10*3/uL (ref 0.7–4.0)
Monocytes Absolute: 0.4 10*3/uL (ref 0.1–1.0)
Monocytes Relative: 7 %
Neutro Abs: 2 10*3/uL (ref 1.7–7.7)
Neutrophils Relative %: 33 %

## 2022-07-15 LAB — I-STAT CHEM 8, ED
BUN: 8 mg/dL (ref 8–23)
Calcium, Ion: 1.35 mmol/L (ref 1.15–1.40)
Chloride: 101 mmol/L (ref 98–111)
Creatinine, Ser: 0.6 mg/dL (ref 0.44–1.00)
Glucose, Bld: 93 mg/dL (ref 70–99)
HCT: 52 % — ABNORMAL HIGH (ref 36.0–46.0)
Hemoglobin: 17.7 g/dL — ABNORMAL HIGH (ref 12.0–15.0)
Potassium: 4.6 mmol/L (ref 3.5–5.1)
Sodium: 140 mmol/L (ref 135–145)
TCO2: 30 mmol/L (ref 22–32)

## 2022-07-15 LAB — COMPREHENSIVE METABOLIC PANEL
ALT: 12 U/L (ref 0–44)
AST: 18 U/L (ref 15–41)
Albumin: 3.7 g/dL (ref 3.5–5.0)
Alkaline Phosphatase: 57 U/L (ref 38–126)
Anion gap: 8 (ref 5–15)
BUN: 10 mg/dL (ref 8–23)
CO2: 26 mmol/L (ref 22–32)
Calcium: 10 mg/dL (ref 8.9–10.3)
Chloride: 100 mmol/L (ref 98–111)
Creatinine, Ser: 0.66 mg/dL (ref 0.44–1.00)
GFR, Estimated: >60 mL/min (ref >60)
Glucose, Bld: 93 mg/dL (ref 70–99)
Potassium: 4.2 mmol/L (ref 3.5–5.1)
Sodium: 134 mmol/L — ABNORMAL LOW (ref 135–145)
Total Bilirubin: 0.3 mg/dL (ref 0.3–1.2)
Total Protein: 9.9 g/dL — ABNORMAL HIGH (ref 6.5–8.1)

## 2022-07-15 LAB — AMMONIA: Ammonia: 19 umol/L (ref 9–35)

## 2022-07-15 LAB — PROTIME-INR
INR: 1 (ref 0.8–1.2)
Prothrombin Time: 12.9 seconds (ref 11.4–15.2)

## 2022-07-15 LAB — VALPROIC ACID LEVEL: Valproic Acid Lvl: 114 ug/mL — ABNORMAL HIGH (ref 50.0–100.0)

## 2022-07-15 LAB — CBG MONITORING, ED
Glucose-Capillary: 64 mg/dL — ABNORMAL LOW (ref 70–99)
Glucose-Capillary: 155 mg/dL — ABNORMAL HIGH (ref 70–99)
Glucose-Capillary: 112 mg/dL — ABNORMAL HIGH (ref 70–99)

## 2022-07-15 LAB — LITHIUM LEVEL: Lithium Lvl: <0.06 mmol/L — ABNORMAL LOW (ref 0.60–1.20)

## 2022-07-15 LAB — ETHANOL: Alcohol, Ethyl (B): <10 mg/dL (ref <10)

## 2022-07-15 MED ORDER — IOHEXOL 350 MG/ML SOLN
100.0000 mL | Freq: Once | INTRAVENOUS | Status: AC | PRN
  Administered 2022-07-15: 100 mL via INTRAVENOUS

## 2022-07-15 MED ORDER — LACTATED RINGERS IV BOLUS
1000.0000 mL | Freq: Once | INTRAVENOUS | Status: AC
  Administered 2022-07-15: 1000 mL via INTRAVENOUS

## 2022-07-15 MED ORDER — DEXTROSE 50 % IV SOLN
INTRAVENOUS | Status: AC
  Administered 2022-07-15: 50 mL
  Filled 2022-07-15: qty 50

## 2022-07-15 NOTE — Progress Notes (Signed)
2118 Neuro [facility] notified of CTA/Perfusion reading.  -[place] Telestroke RN

## 2022-07-15 NOTE — ED Notes (Signed)
Patient transported to CT 

## 2022-07-15 NOTE — ED Notes (Signed)
CBG 64 

## 2022-07-15 NOTE — ED Notes (Signed)
Caregiver from [place] states pt was normal 1700-1715 during evening med pass. Nurse was called back into room at 1845 for pt acting unlike self. Pt states pt will carry on conversation, but she does not walk due to generalized weakness.

## 2022-07-15 NOTE — Consult Note (Addendum)
Telestroke cart was activated at 2003. Per treatment team, pt's LKW was at 1830 with c/o R sided weakness, AMS, and unable to follow commands.. EDP provider assessed pt prior to telestroke cart activation. Pt transported to CT prior to telestroke cart activation and returned to the room at 2009. [facility] was paged for code stroke at 2009. Dr, Adrian Blackwater, [facility] appeared on telestroke cart at 2012 to assess the patient. Based on TSMD's assessment, pt does not meet criteria for emergent interventions at this time 2/2 other diagnosis. Pt was transported back to CT for advanced imaging at 2030. T[facility]N to f/u with [facility] with imaging results. [facility] to f/u with EDP regarding recommendations. No further needs from Telestroke RN at this time. Telestroke cart disconnected at 2031.Henrietta Dine, TSRN assumed care of pt at 2105. Pending CTA/CTP results that will need to be relayed to Dr. Adrian Blackwater, [facility].

## 2022-07-15 NOTE — Consult Note (Addendum)
Initial Consultation Note   Patient: Karen Best [id number] DOB: Sep 24, 1955 PCP: [facility] DOA: 07/15/2022 DOS: the patient was seen and examined on 07/15/2022 Primary service: Godfrey Pick, MD Initial Consultation Note  [facility] TeleNeurology Consult Services   Patient Name:   Karen Best, Karen Best Date of Birth:   [birth year] Identification Number:   MRN - [id number] Date of Service:   07/15/2022 20:09:34  Diagnosis:       G93.41 - Encephalopathy Metabolic  Impression:      The patient is a 67 year old woman with a past medical history significant for stroke with residual right-sided deficits at baseline. She is reportedly wheelchair bound. She presents to the emergency department with altered mental status as well as questionable weakness of her right leg. On examination, she appears to be exhibiting generalized weakness of her extremities. She appears to have contracture of her right upper extremity, which is presumably chronic from her previous left hemispheric infarct.    Differential diagnosis includes toxic metabolic encephalopathy versus ischemia. I have recommended a CT angiogram as well as CT perfusion studies. I have requested these studies and the results are pending.    For the time being, I did not recommend any thrombolytics, as the diagnosis of stroke remains in question. If there are perfusion deficits or large vessel occlusion on CTA/P, this can be reconsidered.    In the meantime, I would recommend a toxic metabolic workup as well as an MRI scan of her brain without contrast.    Thank you for allowing me to participate in her care.      Our recommendations are outlined below.  Recommendations:        Stroke/Telemetry Floor       Neuro Checks       Bedside Swallow Eval       DVT Prophylaxis       IV Fluids, Normal Saline       Head of Bed 30 Degrees       Euglycemia and Avoid Hyperthermia (PRN Acetaminophen)  Sign  Out:       Discussed with Emergency Department Provider    ------------------------------------------------------------------------------  Advanced Imaging: Advanced imaging has been ordered. Results pending.   Metrics: Last Known Well: 07/15/2022 18:30:00 [facility] Notification Time: 07/15/2022 20:09:34 Arrival Time: 07/15/2022 20:00:00 Stamp Time: 07/15/2022 20:09:34 Initial Response Time: 07/15/2022 20:12:34 Symptoms: AMS. Initial patient interaction: 07/15/2022 20:15:06 NIHSS Assessment Completed: 07/15/2022 20:25:00 Patient is not a candidate for Thrombolytic. Thrombolytic Medical Decision: 07/15/2022 20:25:00 Patient was not deemed candidate for Thrombolytic because of following reasons: Other Diagnosis suspected.  CT head showed no acute hemorrhage or acute core infarct.  Primary Provider Notified of Diagnostic Impression and Management Plan on: 07/15/2022 20:28:53    ------------------------------------------------------------------------------  History of Present Illness: Patient is a 67 year old Female.  Patient was brought by EMS for symptoms of AMS. The patient is a 67 year old woman. She has a past medical history significant for schizophrenia, stroke. She does have some right-sided weakness at baseline and is reportedly wheelchair bound.  I attempted to call her care facility at the phone number provided for corroborating history but was unable to reach anybody. The patient herself is unable to provide any meaningful history.  She has had 2 visits to the hospital for metabolic encephalopathy past. She has a history of chronic hepatitis, typical toxicity, schizoaffective disorder, acute kidney injury, asthma interestingly, she is not on any antiplatelets or any anticoagulants at home.  Care facility number that  I attempted to contact: [phone]  MRI Brain: IMPRESSION: 6 mm acute infarct within the posterior left subinsular white matter.    Past  Medical History:      Hypertension      Stroke [place] PMH:  Hep C, encephalopathy  Medications:  Anticoagulant use:  Unknown Antiplatelet use: Unknown Reviewed EMR for current medications Other Medications Pertinent To Assessment Include: None on med list  Allergies:  Reviewed  Social History: Smoking: Yes  Family History:  There is no family history of premature cerebrovascular disease pertinent to this consultation  ROS : 14 Points Review of Systems was performed and was negative except mentioned in HPI.  Past Surgical History: There Is No Surgical History Contributory To Today's Visit    Examination: BP(157/100), Blood Glucose(64) 1A: Level of Consciousness - Requires repeated stimulation to arouse + 2 1B: Ask Month and Age - Could Not Answer Either Question Correctly + 2 1C: Blink Eyes & Squeeze Hands - Performs 0 Tasks + 2 2: Test Horizontal Extraocular Movements - Normal + 0 3: Test Visual Fields - No Visual Loss + 0 4: Test Facial Palsy (Use Grimace if Obtunded) - Normal symmetry + 0 5A: Test Left Arm Motor Drift - Some Effort Against Gravity + 2 5B: Test Right Arm Motor Drift - Some Effort Against Gravity + 2 6A: Test Left Leg Motor Drift - No Effort Against Gravity + 3 6B: Test Right Leg Motor Drift - No Effort Against Gravity + 3 7: Test Limb Ataxia (FNF/Heel-Shin) - No Ataxia + 0 8: Test Sensation - Normal; No sensory loss + 0 9: Test Language/Aphasia - Mute/Global Aphasia: No Usable Speech/Auditory Comprehension + 3 10: Test Dysarthria - Normal + 0 11: Test Extinction/Inattention - No abnormality + 0  NIHSS Score: 19   Pre-Morbid Modified Rankin Scale: 4 Points = Moderately severe disability; unable to walk and attend to bodily needs without assistance  Spoke with : Dr. Doren Custard  Patient/Family was informed the Neurology Consult would occur via TeleHealth consult by way of interactive audio and video telecommunications and consented to receiving care  in this manner.   Patient is being evaluated for possible acute neurologic impairment and high probability of imminent or life-threatening deterioration. I spent total of 30 minutes providing care to this patient, including time for face to face visit via telemedicine, review of medical records, imaging studies and discussion of findings with providers, the patient and/or family.   Dr Clair Gulling   [facility] For Inpatient follow-up with [facility] physician please call [facility] [phone]. This is not an outpatient service. Post hospital discharge, please contact hospital directly.  Please do not communicate with [facility] physicians via secure chat. If you have any questions, Please contact [facility]. Please call or reconsult our service if there are any clinical or diagnostic changes.   ADDENDUM: Reviewing the CTA/P images. I do not appreciate M1, P1 or Basilar occlusion. I do not see a large CTP mismatch to suggest an LVO. Formal read is pending.

## 2022-07-15 NOTE — ED Provider Notes (Signed)
[facility] Provider Note   CSN: [id number] Arrival date & time: 07/15/22  2000  An emergency department physician performed an initial assessment on this suspected stroke patient at 2003.  History  Chief Complaint  Patient presents with   Code Stroke    Karen Best is a 67 y.o. female.  HPI Patient presents for altered mental status.  Medical history includes hepatitis C, CVA, schizoaffective disorder, HTN.  She resides in a skilled nursing facility.  Initial history is provided by EMS.  Patient is reportedly nonambulatory but conversant at baseline.  She was in her normal state of health earlier this evening at 5:30 PM.  She was calling out for her dinner.  They brought her dinner and stated that someone would come back to help her eat.  Staff next checked on her approximately 1.5 hours later.  When they did, she was altered.  EMS noted normal CBG.  Patient is unable to provide history at this time.    Home Medications Prior to Admission medications   Medication Sig Start Date End Date Taking? Authorizing Provider  acetaminophen (TYLENOL) 325 MG tablet Take 650 mg by mouth every 6 (six) hours as needed.    [provider]  atorvastatin (LIPITOR) 10 MG tablet Take 10 mg by mouth daily.    [provider]  clonazePAM (KLONOPIN) 0.5 MG tablet Take 0.5 mg by mouth 2 (two) times daily as needed for anxiety. Patient not taking: Reported on 08/16/2021    [provider]  divalproex (DEPAKOTE) 500 MG DR tablet Take 1 tablet (500 mg total) by mouth every 12 (twelve) hours. 02/25/21   Oswald Hillock, MD  docusate sodium (COLACE) 100 MG capsule Take 100 mg by mouth daily.    [provider]  hydrOXYzine (ATARAX) 25 MG tablet Take 25 mg by mouth every 8 (eight) hours as needed for itching.    [provider]  melatonin 5 MG TABS Take 1 tablet (5 mg total) by mouth at bedtime. Patient not taking:  Reported on 08/16/2021 11/17/20   Florencia Reasons, MD  metoprolol tartrate (LOPRESSOR) 25 MG tablet Take 0.5 tablets (12.5 mg total) by mouth 2 (two) times daily. Patient not taking: Reported on 08/16/2021 11/17/20   Florencia Reasons, MD  OLANZapine (ZYPREXA) 2.5 MG tablet Take 2.5 mg by mouth at bedtime.    [provider]  OLANZapine (ZYPREXA) 5 MG tablet Take 1 tablet (5 mg total) by mouth at bedtime. Patient not taking: Reported on 08/16/2021 02/25/21   Oswald Hillock, MD  senna (SENOKOT) 8.6 MG tablet Take 1 tablet by mouth daily.    [provider]  valbenazine (INGREZZA) 40 MG capsule Take 1 capsule (40 mg total) by mouth at bedtime. Patient not taking: Reported on 08/16/2021 02/25/21   Oswald Hillock, MD      Allergies    Haloperidol and related    Review of Systems   Review of Systems  Unable to perform ROS: Mental status change    Physical Exam Updated Vital Signs BP (!) 158/99   Pulse 72   Temp 98.6 F (37 C) (Oral)   Resp 15   SpO2 90%  Physical Exam Vitals and nursing note reviewed.  Constitutional:      General: She is not in acute distress.    Appearance: She is well-developed. She is ill-appearing. She is not toxic-appearing or diaphoretic.  HENT:     Head: Normocephalic and atraumatic.  Right Ear: External ear normal.     Left Ear: External ear normal.     Nose: Nose normal.     Mouth/Throat:     Mouth: Mucous membranes are dry.  Eyes:     Extraocular Movements: Extraocular movements intact.     Conjunctiva/sclera: Conjunctivae normal.  Cardiovascular:     Rate and Rhythm: Normal rate and regular rhythm.     Heart sounds: No murmur heard. Pulmonary:     Effort: Pulmonary effort is normal. No respiratory distress.     Breath sounds: Normal breath sounds. No wheezing or rales.  Abdominal:     General: There is no distension.     Palpations: Abdomen is soft.     Tenderness: There is no abdominal tenderness.  Musculoskeletal:        General: No swelling  or deformity.     Cervical back: Neck supple. No tenderness.     Right lower leg: No edema.     Left lower leg: No edema.  Skin:    General: Skin is warm and dry.     Capillary Refill: Capillary refill takes less than 2 seconds.     Coloration: Skin is not jaundiced or pale.  Neurological:     Mental Status: She is alert.     GCS: GCS eye subscore is 4. GCS verbal subscore is 4. GCS motor subscore is 6.     Cranial Nerves: Dysarthria present. No facial asymmetry.     Motor: Weakness (Global, possibly worse on RLE) present.     ED Results / Procedures / Treatments   Labs (all labs ordered are listed, but only abnormal results are displayed) Labs Reviewed  CBC - Abnormal; Notable for the following components:      Result Value   RBC 6.35 (*)    Hemoglobin 15.1 (*)    HCT 48.9 (*)    MCV 77.0 (*)    MCH 23.8 (*)    RDW 17.5 (*)    All other components within normal limits  COMPREHENSIVE METABOLIC PANEL - Abnormal; Notable for the following components:   Sodium 134 (*)    Total Protein 9.9 (*)    All other components within normal limits  VALPROIC ACID LEVEL - Abnormal; Notable for the following components:   Valproic Acid Lvl 114 (*)    All other components within normal limits  LITHIUM LEVEL - Abnormal; Notable for the following components:   Lithium Lvl <0.06 (*)    All other components within normal limits  I-STAT CHEM 8, ED - Abnormal; Notable for the following components:   Hemoglobin 17.7 (*)    HCT 52.0 (*)    All other components within normal limits  CBG MONITORING, ED - Abnormal; Notable for the following components:   Glucose-Capillary 64 (*)    All other components within normal limits  CBG MONITORING, ED - Abnormal; Notable for the following components:   Glucose-Capillary 155 (*)    All other components within normal limits  CBG MONITORING, ED - Abnormal; Notable for the following components:   Glucose-Capillary 112 (*)    All other components within  normal limits  PROTIME-INR  DIFFERENTIAL  ETHANOL  AMMONIA  RAPID URINE DRUG SCREEN, HOSP PERFORMED  URINALYSIS, ROUTINE W REFLEX MICROSCOPIC    EKG EKG Interpretation  Date/Time:  Tuesday July 15 2022 20:23:53 EDT Ventricular Rate:  77 PR Interval:  145 QRS Duration: 90 QT Interval:  376 QTC Calculation: 426 R Axis:   -60  Text Interpretation: Sinus rhythm Left anterior fascicular block Abnormal R-wave progression, early transition Baseline wander in lead(s) V3 Confirmed by Godfrey Pick [id number]) on 07/15/2022 8:30:19 PM  Radiology CT ANGIO HEAD NECK W WO CM W PERF (CODE STROKE)  Result Date: 07/15/2022 CLINICAL DATA:  Neuro deficit, acute, stroke suspected. Altered mental status. Extremely lethargic. EXAM: CT ANGIOGRAPHY HEAD AND NECK CT PERFUSION BRAIN TECHNIQUE: Multidetector CT imaging of the head and neck was performed using the standard protocol during bolus administration of intravenous contrast. 3D post processing, including multiplanar CT image reconstructions and MIPs were obtained to evaluate the vascular anatomy. Carotid stenosis measurements (when applicable) are obtained utilizing NASCET criteria, using the distal internal carotid diameter as the denominator. Multiphase CT imaging of the brain was performed following IV bolus contrast injection. Subsequent parametric perfusion maps were calculated using RAPID software. RADIATION DOSE REDUCTION: This exam was performed according to the departmental dose-optimization program which includes automated exposure control, adjustment of the mA and/or kV according to patient size and/or use of iterative reconstruction technique. CONTRAST:  139mL OMNIPAQUE IOHEXOL 350 MG/ML SOLN COMPARISON:  Head CT 07/15/2022. FINDINGS: CTA NECK FINDINGS Aortic arch: Three-vessel arch configuration. Atherosclerotic calcifications of the aortic arch and arch vessel origins. Arch vessel origins are patent. Right carotid system: No evidence of dissection,  stenosis (50% or greater) or occlusion. Left carotid system: No evidence of dissection, stenosis (50% or greater) or occlusion. Mild mixed plaque at the proximal left cervical ICA. Vertebral arteries: Right dominant. No evidence of dissection, stenosis or occlusion. Skeleton: No suspicious bone lesions. Other neck: Unremarkable. Upper chest: Emphysema in the lung apices. Review of the MIP images confirms the above findings CTA HEAD FINDINGS Anterior circulation: Intracranial ICAs are patent without stenosis or aneurysm. The proximal ACAs and MCAs are patent without stenosis or aneurysm. Distal branches are symmetric. Posterior circulation: Normal basilar artery. The SCAs, AICAs and PICAs are patent proximally. Mild stenosis of the distal right PCA, P3 segment. Left PCA is patent without stenosis or aneurysm. Distal branches are symmetric. Venous sinuses: As permitted by contrast timing, patent. Anatomic variants: None. Review of the MIP images confirms the above findings CT Brain Perfusion Findings: ASPECTS: 10 CBF (<30%) Volume: 6mL Perfusion (Tmax>6.0s) volume: 59mL, favored to be artifact given patient motion during acquisition. Mismatch Volume: 29mL Infarction Location:Not applicable IMPRESSION: 1. No large vessel occlusion or significant stenosis of the head or neck vessels. 2. No evidence of core infarct or ischemia on CT perfusion. 3. Aortic Atherosclerosis (ICD10-I70.0). Electronically Signed   By: Emmit Alexanders M.D.   On: 07/15/2022 21:12   CT HEAD CODE STROKE WO CONTRAST  Result Date: 07/15/2022 CLINICAL DATA:  Code stroke. EXAM: CT HEAD WITHOUT CONTRAST TECHNIQUE: Contiguous axial images were obtained from the base of the skull through the vertex without intravenous contrast. RADIATION DOSE REDUCTION: This exam was performed according to the departmental dose-optimization program which includes automated exposure control, adjustment of the mA and/or kV according to patient size and/or use of iterative  reconstruction technique. COMPARISON:  Prior CT from 02/22/2021. FINDINGS: Brain: Age-appropriate cerebral volume. Mild to moderate chronic microvascular ischemic disease seen involving the periventricular white matter. No acute intracranial hemorrhage. No acute large vessel territory infarct. No mass lesion or midline shift. No hydrocephalus or extra-axial fluid collection. Vascular: No abnormal hyperdense vessel. Scattered vascular calcifications noted within the carotid siphons. Skull: Scalp soft tissues demonstrate no acute finding. Calvarium intact. Sinuses/Orbits: Globes orbital soft tissues within normal limits. Paranasal sinuses are largely clear. No  mastoid effusion. Other: None. ASPECTS [facility] Stroke Program Early CT Score) - Ganglionic level infarction (caudate, lentiform nuclei, internal capsule, insula, M1-M3 cortex): 7 - Supraganglionic infarction (M4-M6 cortex): 3 Total score (0-10 with 10 being normal): 10 IMPRESSION: 1. No acute intracranial abnormality. 2. ASPECTS is 10. 3. Mild to moderate chronic microvascular ischemic disease. Critical Value/emergent results were called by telephone at the time of interpretation on 07/15/2022 at 8:18 pm to provider Godfrey Pick , who verbally acknowledged these results. Electronically Signed   By: Jeannine Boga M.D.   On: 07/15/2022 20:20    Procedures Procedures    Medications Ordered in ED Medications  dextrose 50 % solution (50 mLs  Given 07/15/22 2028)  iohexol (OMNIPAQUE) 350 MG/ML injection 100 mL (100 mLs Intravenous Contrast Given 07/15/22 2047)  lactated ringers bolus 1,000 mL (1,000 mLs Intravenous New Bag/Given 07/15/22 2201)    ED Course/ Medical Decision Making/ A&P                             Medical Decision Making Amount and/or Complexity of Data Reviewed Labs: ordered. Radiology: ordered.  Risk Prescription drug management.   This patient presents to the ED for concern of altered mental status, this involves an  extensive number of treatment options, and is a complaint that carries with it a high risk of complications and morbidity.  The differential diagnosis includes CVA, TIA, seizure, ICH, toxic metabolic encephalopathy, polypharmacy   Co morbidities that complicate the patient evaluation  hepatitis C, CVA, schizoaffective disorder, HTN   Additional history obtained:  Additional history obtained from EMS External records from outside source obtained and reviewed including EMR   Lab Tests:  I Ordered, and personally interpreted labs.  The pertinent results include: Normal hemoglobin, no leukocytosis, normal electrolytes, mildly supratherapeutic valproic acid, normal ammonia   Imaging Studies ordered:  I ordered imaging studies including CT head, CTA head and neck I independently visualized and interpreted imaging which showed no acute findings I agree with the radiologist interpretation   Cardiac Monitoring: / EKG:  The patient was maintained on a cardiac monitor.  I personally viewed and interpreted the cardiac monitored which showed an underlying rhythm of: Sinus rhythm   Consultations Obtained:  I requested consultation with the neurologist, Dr. Adrian Blackwater and discussed lab and imaging findings as well as pertinent plan - they recommend: Admission for encephalopathy.  MRI when possible.   Problem List / ED Course / Critical interventions / Medication management  Patient presents for acute onset of altered mental status.  This occurred this evening sometime after 5:30 PM, when she was last seen normal.  At baseline, she is conversant.  She does reside in a skilled nursing facility.  On arrival, patient is awake.  EOM appear intact.  Mouth is open and oral mucosa appears dry.  When asked questions, speech is unintelligible.  She is able to follow commands.  She is able to give a very weak squeeze on both hands.  She was able to barely move her left foot.  Per chart review, she is not on  a blood thinner.  She is on Klonopin, Depakote, olanzapine, and Ingrezza.  Code stroke was initiated.  Patient was taken to [facility].  Lab workup was initiated to identify possible metabolic causes of her altered mental state.  Noncontrasted CT scan of head did not show any acute findings.  CBG was low-normal and patient was given D50.  This  did not result in any change in her mentation.  I spoke with neurologist on-call, who did order CTA head and neck to assess for possible brainstem stroke.  If this is negative, patient is appropriate for admission here at [facility].  MRI in the morning will be fine.  Lab work shows mildly supratherapeutic Depakote level.  Ammonia is normal.  Patient had no change in mental status.  Patient was admitted to medicine for further management. I ordered medication including IV fluids for duration; D50 for low-normal blood glucose Reevaluation of the patient after these medicines showed that the patient stayed the same I have reviewed the patients home medicines and have made adjustments as needed   Social Determinants of Health:  Currently resides in nursing facility         Final Clinical Impression(s) / ED Diagnoses Final diagnoses:  Encephalopathy acute    Rx / DC Orders ED Discharge Orders     None         Godfrey Pick, MD 07/15/22 2228

## 2022-07-15 NOTE — ED Notes (Signed)
Pt transported to CT ?

## 2022-07-15 NOTE — ED Triage Notes (Addendum)
PT BIB [facility] from [place] as a code stroke. Per EMS, facility went to check on her around 1830 and she was at her baseline, talking and following commands, around 1915 they went to giver her a dinner tray and pt was altered, not able to follow commands, and would not move her RLE. Dr. Doren Custard at bedside.  Hx of left side ischemic stroke

## 2022-07-16 ENCOUNTER — Observation Stay (HOSPITAL_COMMUNITY): Payer: Medicare Other

## 2022-07-16 ENCOUNTER — Observation Stay (HOSPITAL_COMMUNITY)
Admit: 2022-07-16 | Discharge: 2022-07-16 | Disposition: A | Discharge status: Home / Self Care | Payer: Medicare Other | Attending: Family Medicine | Admitting: Family Medicine

## 2022-07-16 ENCOUNTER — Observation Stay (HOSPITAL_BASED_OUTPATIENT_CLINIC_OR_DEPARTMENT_OTHER): Payer: Medicare Other

## 2022-07-16 DIAGNOSIS — E785 Hyperlipidemia, unspecified: Secondary | ICD-10-CM | POA: Diagnosis not present

## 2022-07-16 DIAGNOSIS — E782 Mixed hyperlipidemia: Secondary | ICD-10-CM | POA: Insufficient documentation

## 2022-07-16 DIAGNOSIS — I10 Essential (primary) hypertension: Secondary | ICD-10-CM

## 2022-07-16 DIAGNOSIS — R4182 Altered mental status, unspecified: Secondary | ICD-10-CM

## 2022-07-16 DIAGNOSIS — G9341 Metabolic encephalopathy: Secondary | ICD-10-CM | POA: Diagnosis not present

## 2022-07-16 DIAGNOSIS — F250 Schizoaffective disorder, bipolar type: Secondary | ICD-10-CM | POA: Diagnosis not present

## 2022-07-16 LAB — COMPREHENSIVE METABOLIC PANEL
ALT: 9 U/L (ref 0–44)
AST: 12 U/L — ABNORMAL LOW (ref 15–41)
Albumin: 2.9 g/dL — ABNORMAL LOW (ref 3.5–5.0)
Alkaline Phosphatase: 43 U/L (ref 38–126)
Anion gap: 7 (ref 5–15)
BUN: 7 mg/dL — ABNORMAL LOW (ref 8–23)
CO2: 24 mmol/L (ref 22–32)
Calcium: 9.2 mg/dL (ref 8.9–10.3)
Chloride: 105 mmol/L (ref 98–111)
Creatinine, Ser: 0.54 mg/dL (ref 0.44–1.00)
GFR, Estimated: >60 mL/min (ref >60)
Glucose, Bld: 73 mg/dL (ref 70–99)
Potassium: 4 mmol/L (ref 3.5–5.1)
Sodium: 136 mmol/L (ref 135–145)
Total Bilirubin: 0.4 mg/dL (ref 0.3–1.2)
Total Protein: 7.5 g/dL (ref 6.5–8.1)

## 2022-07-16 LAB — HEMOGLOBIN A1C
Hgb A1c MFr Bld: 5.7 % — ABNORMAL HIGH (ref 4.8–5.6)
Mean Plasma Glucose: 117 mg/dL

## 2022-07-16 LAB — LIPID PANEL
Cholesterol: 135 mg/dL (ref 0–200)
HDL: 39 mg/dL — ABNORMAL LOW (ref >40)
LDL Cholesterol: 83 mg/dL (ref 0–99)
Total CHOL/HDL Ratio: 3.5 RATIO
Triglycerides: 64 mg/dL (ref <150)
VLDL: 13 mg/dL (ref 0–40)

## 2022-07-16 LAB — ECHOCARDIOGRAM COMPLETE
Area-P 1/2: 2.6 cm2
Height: 65 in
S' Lateral: 1.6 cm
Weight: 2504.43 oz

## 2022-07-16 LAB — TSH: TSH: 0.812 u[IU]/mL (ref 0.350–4.500)

## 2022-07-16 MED ORDER — STROKE: EARLY STAGES OF RECOVERY BOOK: Freq: Once | Status: DC

## 2022-07-16 MED ORDER — HYDROXYZINE HCL 25 MG PO TABS: 25.0000 mg | ORAL_TABLET | Freq: Three times a day (TID) | ORAL | 3 refills | Status: DC | PRN

## 2022-07-16 MED ORDER — POLYETHYLENE GLYCOL 3350 17 G PO PACK: 17.0000 g | PACK | Freq: Every day | ORAL | 3 refills | Status: AC

## 2022-07-16 MED ORDER — MELATONIN 3 MG PO TABS: 6.0000 mg | ORAL_TABLET | Freq: Every day | ORAL | Status: DC

## 2022-07-16 MED ORDER — OLANZAPINE 5 MG PO TABS: 2.5000 mg | ORAL_TABLET | Freq: Every day | ORAL | Status: DC

## 2022-07-16 MED ORDER — VALBENAZINE TOSYLATE 40 MG PO CAPS
40.0000 mg | ORAL_CAPSULE | Freq: Every day | ORAL | Status: DC
  Filled 2022-07-16 (×3): qty 1

## 2022-07-16 MED ORDER — HYDROXYZINE HCL 25 MG PO TABS: 25.0000 mg | ORAL_TABLET | Freq: Three times a day (TID) | ORAL | Status: DC | PRN

## 2022-07-16 MED ORDER — SENNOSIDES-DOCUSATE SODIUM 8.6-50 MG PO TABS: 2.0000 | ORAL_TABLET | Freq: Every day | ORAL | 3 refills | Status: DC

## 2022-07-16 MED ORDER — HEPARIN SODIUM (PORCINE) 5000 UNIT/ML IJ SOLN
5000.0000 [IU] | Freq: Three times a day (TID) | INTRAMUSCULAR | Status: DC
  Administered 2022-07-16: 5000 [IU] via SUBCUTANEOUS
  Filled 2022-07-16: qty 1

## 2022-07-16 MED ORDER — ACETAMINOPHEN 650 MG RE SUPP: 650.0000 mg | RECTAL | Status: DC | PRN

## 2022-07-16 MED ORDER — ONDANSETRON HCL 4 MG/2ML IJ SOLN: 4.0000 mg | Freq: Four times a day (QID) | INTRAMUSCULAR | Status: DC | PRN

## 2022-07-16 MED ORDER — OLANZAPINE 5 MG PO TABS: 5.0000 mg | ORAL_TABLET | Freq: Every day | ORAL | Status: DC

## 2022-07-16 MED ORDER — LACTULOSE 10 GM/15ML PO SOLN
30.0000 g | Freq: Once | ORAL | Status: AC
  Administered 2022-07-16: 30 g via ORAL
  Filled 2022-07-16: qty 60

## 2022-07-16 MED ORDER — DIVALPROEX SODIUM 125 MG PO DR TAB: 375.0000 mg | DELAYED_RELEASE_TABLET | Freq: Two times a day (BID) | ORAL | 5 refills | Status: DC

## 2022-07-16 MED ORDER — CLONAZEPAM 0.5 MG PO TABS: 0.5000 mg | ORAL_TABLET | Freq: Two times a day (BID) | ORAL | Status: DC | PRN

## 2022-07-16 MED ORDER — ACETAMINOPHEN 160 MG/5ML PO SOLN: 650.0000 mg | ORAL | Status: DC | PRN

## 2022-07-16 MED ORDER — ACETAMINOPHEN 325 MG PO TABS: 650.0000 mg | ORAL_TABLET | ORAL | Status: DC | PRN

## 2022-07-16 MED ORDER — OLANZAPINE 5 MG PO TABS: 5.0000 mg | ORAL_TABLET | Freq: Every day | ORAL | 3 refills | Status: DC

## 2022-07-16 MED ORDER — SODIUM CHLORIDE 0.9 % IV SOLN: INTRAVENOUS | Status: DC

## 2022-07-16 MED ORDER — BISACODYL 10 MG RE SUPP
10.0000 mg | Freq: Once | RECTAL | Status: DC
  Filled 2022-07-16: qty 1

## 2022-07-16 MED ORDER — ATORVASTATIN CALCIUM 10 MG PO TABS
10.0000 mg | ORAL_TABLET | Freq: Every day | ORAL | Status: DC
  Administered 2022-07-16: 10 mg via ORAL
  Filled 2022-07-16: qty 1

## 2022-07-16 NOTE — Progress Notes (Signed)
Pt non-verbal,unable to complete Admission Data. SRP, RN

## 2022-07-16 NOTE — Progress Notes (Signed)
EEG complete - results pending 

## 2022-07-16 NOTE — Assessment & Plan Note (Signed)
-   Continue Zyprexa, Ingrezza, Klonopin

## 2022-07-16 NOTE — Progress Notes (Signed)
Pt d/c a shift change. EMS arrive to transport pt to [facility]. Pt stable and alert talking. SP, RN

## 2022-07-16 NOTE — Progress Notes (Signed)
OT Cancellation Note  Patient Details Name: Karen Best MRN: [id number] DOB: [birth year]   Cancelled Treatment:    Reason Eval/Treat Not Completed: OT screened, no needs identified, will sign off. Pt is non-ambulatory with use of a mechanical lift at SNF. Pt reports ability to feed herself at baseline with assist for all other ADL's. Today pt could reach her mouth with L UE. Very tight B shoulders indicating minimal functional use. Pt appears near baseline levels for functional ADL's.   Drea Jurewicz OT, MOT   Larey Seat 07/16/2022, 9:11 AM

## 2022-07-16 NOTE — Assessment & Plan Note (Signed)
-   Baseline patient is not ambulatory but usually able to have a conversation - Last known well was 1700 - Found to be acutely abnormal at 1845 - No signs of infection - Ammonia level normal at 19 - Alcohol level undetectable - UA and UDS pending - Patient was initially a code stroke, CTA head and neck showed no large vessel occlusion or stenosis.  No evidence of core infarct or ischemia on CT perfusion - Neurology recommended admission for MRI, and further workup - Consult inpatient neuro - Patient did have supratherapeutic Depakote, holding Depakote - EEG in the a.m. - Continue to monitor

## 2022-07-16 NOTE — Progress Notes (Signed)
PT Cancellation Note  Patient Details Name: Karen Best MRN: [id number] DOB: November 07, 1955   Cancelled Treatment:    Reason Eval/Treat Not Completed: PT screened, no needs identified, will sign off.  Patient at baseline, mechanical lift for transfers, nonambulatory, presents with severe plantar flexor contractures.  12:10 PM, 07/16/22 Karen Best, MPT Physical Therapist with [facility] 336 [phone] office [phone] mobile phone

## 2022-07-16 NOTE — Progress Notes (Signed)
SLP Cancellation Note  Patient Details Name: Karen Best MRN: [id number] DOB: August 20, 1955   Cancelled treatment:       Reason Eval/Treat Not Completed: Patient at procedure or test/unavailable (SLE not attempted due to Pt off floor. Note, we do NOT have an order for BSE. Pt is NPO. Please document Gertie Fey and if fails, order BSE.) SLP only has order for speech/language evaluation.  Thank you,  Genene Churn, [place]  [place] 07/16/2022, 1:13 PM

## 2022-07-16 NOTE — H&P (Signed)
History and Physical    Patient: Karen Best [id number] DOB: [birth year] DOA: 07/15/2022 DOS: the patient was seen and examined on 07/16/2022 PCP: [facility]  Patient coming from: Home  Chief Complaint:  Chief Complaint  Patient presents with   Code Stroke   HPI: Karen Best is a 67 y.o. female with medical history significant of asthma, bipolar 1 disorder, fibromyalgia, hypertension, schizophrenia, tobacco use disorder, and more presents the ED with a chief complaint of altered mental status.  Patient is not able to provide any history at all.  She stares straight ahead, mouth open, not speaking.  She does follow commands.  She has global weakness.  It was noted to the ER that patient was normal at her 17[facility] check at [facility].  Patient is nonambulatory at baseline, but she is normally able to have a conversation.  When they went to check on her again approximately 1 hour 45 minutes later, patient was in the states that she is in now.  She is initially brought into the ER as a code stroke.  Neurology was consulted and thinks this is likely encephalopathy but recommends stroke workup with MRI in the a.m.  Patient did have a supratherapeutic Depakote in the ER, but it was not extremely high, and is not likely contributing to her symptoms.  No further history could be obtained at this time.  Patient does not have any ACP forms on file.  She is made default full code Review of Systems: unable to review all systems due to the inability of the patient to answer questions. Past Medical History:  Diagnosis Date   Asthma    Bipolar 1 disorder ([place])    Fibromyalgia    Hypertension    Schizophrenia ([place])    Tobacco abuse    Past Surgical History:  Procedure Laterality Date   CESAREAN SECTION     TUBAL LIGATION     Social History:  reports that she has been smoking cigarettes. She has been smoking an average of .5 packs per day. She has quit using smokeless  tobacco. She reports current alcohol use. She reports current drug use. Drug: Cocaine.  Allergies  Allergen Reactions   Haloperidol And Related     Extrapyramidal symptoms    Family History  Problem Relation Age of Onset   Liver cancer Neg Hx    Liver disease Neg Hx     Prior to Admission medications   Medication Sig Start Date End Date Taking? Authorizing Provider  acetaminophen (TYLENOL) 325 MG tablet Take 650 mg by mouth every 6 (six) hours as needed.    [provider]  atorvastatin (LIPITOR) 10 MG tablet Take 10 mg by mouth daily.    [provider]  clonazePAM (KLONOPIN) 0.5 MG tablet Take 0.5 mg by mouth 2 (two) times daily as needed for anxiety. Patient not taking: Reported on 08/16/2021    [provider]  divalproex (DEPAKOTE) 500 MG DR tablet Take 1 tablet (500 mg total) by mouth every 12 (twelve) hours. 02/25/21   Oswald Hillock, MD  docusate sodium (COLACE) 100 MG capsule Take 100 mg by mouth daily.    [provider]  hydrOXYzine (ATARAX) 25 MG tablet Take 25 mg by mouth every 8 (eight) hours as needed for itching.    [provider]  melatonin 5 MG TABS Take 1 tablet (5 mg total) by mouth at bedtime. Patient not taking: Reported on 08/16/2021 11/17/20   Erlinda Hong,  Annamaria Boots, MD  metoprolol tartrate (LOPRESSOR) 25 MG tablet Take 0.5 tablets (12.5 mg total) by mouth 2 (two) times daily. Patient not taking: Reported on 08/16/2021 11/17/20   Florencia Reasons, MD  OLANZapine (ZYPREXA) 2.5 MG tablet Take 2.5 mg by mouth at bedtime.    [provider]  OLANZapine (ZYPREXA) 5 MG tablet Take 1 tablet (5 mg total) by mouth at bedtime. Patient not taking: Reported on 08/16/2021 02/25/21   Oswald Hillock, MD  senna (SENOKOT) 8.6 MG tablet Take 1 tablet by mouth daily.    [provider]  valbenazine (INGREZZA) 40 MG capsule Take 1 capsule (40 mg total) by mouth at bedtime. Patient not taking: Reported on 08/16/2021 02/25/21   Oswald Hillock, MD     Physical Exam: Vitals:   07/15/22 2102 07/15/22 2300 07/15/22 2339 07/16/22 0505  BP:  (!) 173/92 (!) 150/44 (!) 142/88  Pulse:  74 73 85  Resp: 15 17 14 18   Temp: 98.6 F (37 C)  99.3 F (37.4 C) 98.2 F (36.8 C)  TempSrc: Oral  Oral Oral  SpO2:  99% 100% 92%   1.  General: Patient lying supine in bed,  no acute distress   2. Psychiatric: Responding to commands, but not speaking, making eye contact, seems to be alert   3. Neurologic: Nonverbal, following commands, moves all 4 extremities with generalized weakness   4. HEENMT:  Head is atraumatic, normocephalic, pupils reactive to light, neck is supple, trachea is midline, mucous membranes are moist   5. Respiratory : Lungs are clear to auscultation bilaterally without wheezing, rhonchi, rales, no cyanosis, no increase in work of breathing or accessory muscle use   6. Cardiovascular : Heart rate normal, rhythm is regular, no murmurs, rubs or gallops, no peripheral edema, peripheral pulses palpated   7. Gastrointestinal:  Abdomen is soft, nondistended, nontender to palpation bowel sounds active, no masses or organomegaly palpated   8. Skin:  Skin is warm, dry and intact without rashes, acute lesions, or ulcers on limited exam   9.Musculoskeletal:  Right lower extremity is cool to touch, asked RN to mark pulses, able to follow commands to move toes on that extremity  Data Reviewed: In the ED Temp 98.6, heart rate 72-79, respiratory rate 12-16, blood pressure 157/99-158/100, satting 90-95% No leukocytosis with white blood cell count of 6.0, hemoglobin 15.1, platelets 180 Chemistries unremarkable Alcohol levels undetectable EKG is a heart rate of 77, sinus rhythm, QTc 426 with a wandering baseline in V3 CTA shows no large vessel occlusion or stenosis UA and UDS pending, RN reminded that we need the samples 1 L LR given in the ED Code stroke initially and neurologist recommends admission for MRI and stroke  workup Admission requested for altered mental status Assessment and Plan: * Acute metabolic encephalopathy - Baseline patient is not ambulatory but usually able to have a conversation - Last known well was 1700 - Found to be acutely abnormal at 1845 - No signs of infection - Ammonia level normal at 19 - Alcohol level undetectable - UA and UDS pending - Patient was initially a code stroke, CTA head and neck showed no large vessel occlusion or stenosis.  No evidence of core infarct or ischemia on CT perfusion - Neurology recommended admission for MRI, and further workup - Consult inpatient neuro - Patient did have supratherapeutic Depakote, holding Depakote - EEG in the a.m. - Continue to monitor  Schizoaffective disorder, bipolar type ([place]) - Continue Zyprexa, Ingrezza, Klonopin  Hyperlipidemia - Continue statin      Advance Care Planning:   Code Status: Full Code  Consults: Neurology  Family Communication: No for only at bedside  Severity of Illness: The appropriate patient status for this patient is OBSERVATION. Observation status is judged to be reasonable and necessary in order to provide the required intensity of service to ensure the patient's safety. The patient's presenting symptoms, physical exam findings, and initial radiographic and laboratory data in the context of their medical condition is felt to place them at decreased risk for further clinical deterioration. Furthermore, it is anticipated that the patient will be medically stable for discharge from the hospital within 2 midnights of admission.   Author: Rolla Plate, DO 07/16/2022 5:58 AM  For on call review [email or web address].

## 2022-07-16 NOTE — Procedures (Signed)
Patient Name: TYLYNN CALCUTT  MRN: [id number]  Epilepsy Attending: Lora Havens  Referring Physician/Provider: Rolla Plate, DO  Date: 07/16/2022 Duration: 28.02 mins  Patient history: 67yo F with ams. EEG to evaluate for seizure  Level of alertness: Awake  AEDs during EEG study: None  Technical aspects: This EEG study was done with scalp electrodes positioned according to the 10-20 International system of electrode placement. Electrical activity was reviewed with band pass filter of 1-70Hz , sensitivity of 7 uV/mm, display speed of 68mm/sec with a 60Hz  notched filter applied as appropriate. EEG data were recorded continuously and digitally stored.  Video monitoring was available and reviewed as appropriate.  Description: The posterior dominant rhythm consists of 8 Hz activity of moderate voltage (25-35 uV) seen predominantly in posterior head regions, symmetric and reactive to eye opening and eye closing. EEG showed intermittent generalized 3 to 6 Hz theta-delta slowing. Hyperventilation and photic stimulation were not performed.     ABNORMALITY - Intermittent slow, generalized  IMPRESSION: This study is suggestive of mild diffuse encephalopathy, nonspecific etiology. No seizures or epileptiform discharges were seen throughout the recording.  Roen Macgowan Barbra Sarks

## 2022-07-16 NOTE — Discharge Summary (Signed)
KHALI STARKOVICH, is a 67 y.o. female  DOB 1955-10-13  MRN [id number].  Admission date:  07/15/2022  Admitting Physician  Rolla Plate, DO  Discharge Date:  07/16/2022   Primary MD  Inc, [facility]  Recommendations for primary care physician for things to follow:   1)Please reduce the valproic acid/Depakote to 375 mg twice daily from 500 mg twice a day  Admission Diagnosis  Encephalopathy acute [id number] Acute metabolic encephalopathy [id number]   Discharge Diagnosis  Encephalopathy acute [id number] Acute metabolic encephalopathy [id number]    Principal Problem:   Acute metabolic encephalopathy Active Problems:   Schizoaffective disorder, bipolar type ([place])   Hyperlipidemia      Past Medical History:  Diagnosis Date   Asthma    Bipolar 1 disorder ([place])    Fibromyalgia    Hypertension    Schizophrenia ([place])    Tobacco abuse     Past Surgical History:  Procedure Laterality Date   CESAREAN SECTION     TUBAL LIGATION       HPI  from the history and physical done on the day of admission:    HPI: VORA CACY is a 67 y.o. female with medical history significant of asthma, bipolar 1 disorder, fibromyalgia, hypertension, schizophrenia, tobacco use disorder, and more presents the ED with a chief complaint of altered mental status.  Patient is not able to provide any history at all.  She stares straight ahead, mouth open, not speaking.  She does follow commands.  She has global weakness.  It was noted to the ER that patient was normal at her 1700 check at SNF.  Patient is nonambulatory at baseline, but she is normally able to have a conversation.  When they went to check on her again approximately 1 hour 45 minutes later, patient was in the states that she is in now.  She is initially brought into the ER as a code stroke.  Neurology was consulted and thinks this is likely  encephalopathy but recommends stroke workup with MRI in the a.m.  Patient did have a supratherapeutic Depakote in the ER, but it was not extremely high, and is not likely contributing to her symptoms.  No further history could be obtained at this time.   Patient does not have any ACP forms on file.  She is made default full code Review of Systems: unable to review all systems due to the inability of the patient to answer questions.   Hospital Course:    Assessment and Plan: Acute metabolic encephalopathy -- No signs of infection - Ammonia level normal at 19 - Alcohol level undetectable - Patient was initially a code stroke, CTA head and neck showed no large vessel occlusion or stenosis.  No evidence of core infarct or ischemia on CT perfusion -MRI brain without acute findings -EEG without epileptiform findings -Neurology consult and input appreciated -- Patient did have supratherapeutic Depakote, Depakote dosage adjusted to 375 mg twice daily from 500 mg twice daily -Mentation returning back to  baseline  Schizoaffective disorder, bipolar type /tardive dyskinesia -Medications adjusted including Depakote as above -pt has  lipsmacking/tardive dyskinesia  Hyperlipidemia - Continue statin   Constipation--abdominal x-ray suggestive of constipation -Laxatives as ordered  Discharge Condition: Stable, , mentation has improved and now appears close to baseline Follow UP--- PCP at facility   Consults obtained -neurology  Diet and Activity recommendation:  As advised  Discharge Instructions     Discharge Instructions     Call MD for:  difficulty breathing, headache or visual disturbances   Complete by: As directed    Call MD for:  persistant dizziness or light-headedness   Complete by: As directed    Call MD for:  persistant nausea and vomiting   Complete by: As directed    Call MD for:  temperature >100.4   Complete by: As directed    Diet general   Complete by: As directed     Discharge instructions   Complete by: As directed    1)Please reduce the valproic acid/Depakote to 375 mg twice daily from 500 mg twice a day   Increase activity slowly   Complete by: As directed         Discharge Medications     Allergies as of 07/16/2022       Reactions   Haloperidol And Related    Extrapyramidal symptoms        Medication List     STOP taking these medications    clonazePAM 0.5 MG tablet Commonly known as: KLONOPIN   docusate sodium 100 MG capsule Commonly known as: COLACE   melatonin 5 MG Tabs   metoprolol tartrate 25 MG tablet Commonly known as: LOPRESSOR   senna 8.6 MG tablet Commonly known as: SENOKOT   valbenazine 40 MG capsule Commonly known as: Ingrezza       TAKE these medications    acetaminophen 325 MG tablet Commonly known as: TYLENOL Take 650 mg by mouth every 6 (six) hours as needed.   atorvastatin 10 MG tablet Commonly known as: LIPITOR Take 10 mg by mouth daily.   divalproex 125 MG DR tablet Commonly known as: Depakote Take 3 tablets (375 mg total) by mouth 2 (two) times daily. What changed:  medication strength how much to take when to take this   hydrOXYzine 25 MG tablet Commonly known as: ATARAX Take 1 tablet (25 mg total) by mouth every 8 (eight) hours as needed for itching, anxiety or nausea. What changed: reasons to take this   OLANZapine 5 MG tablet Commonly known as: ZYPREXA Take 1 tablet (5 mg total) by mouth at bedtime. What changed: Another medication with the same name was removed. Continue taking this medication, and follow the directions you see here.   polyethylene glycol 17 g packet Commonly known as: MiraLax Take 17 g by mouth daily.   senna-docusate 8.6-50 MG tablet Commonly known as: Senokot-S Take 2 tablets by mouth at bedtime.        Major procedures and Radiology Reports - PLEASE review detailed and final reports for all details, in brief -   MR BRAIN WO  CONTRAST  Result Date: 07/16/2022 CLINICAL DATA:  Mental status change, unknown cause. EXAM: MRI HEAD WITHOUT CONTRAST TECHNIQUE: Multiplanar, multiecho pulse sequences of the brain and surrounding structures were obtained without intravenous contrast. COMPARISON:  Head CT and CTA head/neck 07/15/2022. FINDINGS: Brain: No acute infarct or intracranial hemorrhage. Mild chronic small-vessel disease. Age advanced, generalized volume loss. No acute hydrocephalus or extra-axial collection. No mass or  midline shift. Vascular: Normal flow voids. Skull and upper cervical spine: Normal marrow signal. Sinuses/Orbits: Unremarkable. Other: None. IMPRESSION: 1. No acute intracranial abnormality or mass. 2. Age advanced, generalized volume loss. Electronically Signed   By: Emmit Alexanders M.D.   On: 07/16/2022 13:57   DG ABD ACUTE 2+V W 1V CHEST  Result Date: 07/16/2022 CLINICAL DATA:  Abdominal pain. EXAM: DG ABDOMEN ACUTE WITH 1 VIEW CHEST COMPARISON:  None Available. FINDINGS: Lungs are clear. Unremarkable cardiac silhouette. Normal pulmonary vasculature. No free air identified below the diaphragm. The bowel gas pattern is normal without gaseous distention. No radio-opaque calculi or other significant radiographic abnormality are seen. Increased stool noted consistent with constipation. IMPRESSION: Constipation.  No acute cardiopulmonary process. Electronically Signed   By: Sammie Bench M.D.   On: 07/16/2022 12:49   ECHOCARDIOGRAM COMPLETE  Result Date: 07/16/2022    ECHOCARDIOGRAM REPORT   Patient Name:   RONNESHIA CORRIE Date of Exam: 07/16/2022 Medical Rec #:  [id number]         Height:       65.0 in Accession #:    [id number]        Weight:       156.5 lb Date of Birth:  Oct 26, 1955         BSA:          1.782 m Patient Age:    36 years          BP:           142/88 mmHg Patient Gender: F                 HR:           61 bpm. Exam Location:  [facility] Procedure: 2D Echo, Cardiac Doppler and Color Doppler  Indications:    Stroke I63.9  History:        Patient has prior history of Echocardiogram examinations, most                 recent 10/04/2019. Stroke, Signs/Symptoms:Altered Mental Status;                 Risk Factors:Dyslipidemia, Current Smoker and Hypertension.  Sonographer:    Greer Pickerel Referring Phys: [id number] ASIA B [place]  Sonographer Comments: Image acquisition challenging due to patient body habitus and Image acquisition challenging due to respiratory motion. IMPRESSIONS  1. Left ventricular ejection fraction, by estimation, is 60 to 65%. The left ventricle has normal function. Left ventricular endocardial border not optimally defined to evaluate regional wall motion. Left ventricular diastolic parameters were normal.  2. Right ventricular systolic function is normal. The right ventricular size is normal. There is normal pulmonary artery systolic pressure.  3. A small pericardial effusion is present. The pericardial effusion is surrounding the apex.  4. The mitral valve is normal in structure. No evidence of mitral valve regurgitation. No evidence of mitral stenosis.  5. The aortic valve was not well visualized. Aortic valve regurgitation is not visualized.  6. The inferior vena cava is normal in size with <50% respiratory variability, suggesting right atrial pressure of 8 mmHg. Comparison(s): No prior Echocardiogram. Conclusion(s)/Recommendation(s): During exam, paient asleept for part of it. Apneic while sleeping was noted during image acquisition. Outpatient sleep apnea evaluation may be appropriate. FINDINGS  Left Ventricle: Left ventricular ejection fraction, by estimation, is 60 to 65%. The left ventricle has normal function. Left ventricular endocardial border not optimally defined to evaluate regional wall motion. The left ventricular  internal cavity size was small. There is no left ventricular hypertrophy. Left ventricular diastolic parameters were normal. Right Ventricle: The right  ventricular size is normal. Right vetricular wall thickness was not well visualized. Right ventricular systolic function is normal. There is normal pulmonary artery systolic pressure. The tricuspid regurgitant velocity is 2.32 m/s, and with an assumed right atrial pressure of 8 mmHg, the estimated right ventricular systolic pressure is [id number] mmHg. Left Atrium: Left atrial size was not well visualized. Right Atrium: Right atrial size was normal in size. Pericardium: A small pericardial effusion is present. The pericardial effusion is surrounding the apex. Mitral Valve: The mitral valve is normal in structure. No evidence of mitral valve regurgitation. No evidence of mitral valve stenosis. Tricuspid Valve: The tricuspid valve is normal in structure. Tricuspid valve regurgitation is not demonstrated. No evidence of tricuspid stenosis. Aortic Valve: The aortic valve was not well visualized. Aortic valve regurgitation is not visualized. Pulmonic Valve: The pulmonic valve was not well visualized. Pulmonic valve regurgitation is not visualized. Aorta: The aortic root and ascending aorta are structurally normal, with no evidence of dilitation. Venous: The inferior vena cava is normal in size with less than 50% respiratory variability, suggesting right atrial pressure of 8 mmHg. IAS/Shunts: The interatrial septum was not well visualized.  LEFT VENTRICLE PLAX 2D LVIDd:         2.40 cm   Diastology LVIDs:         1.60 cm   LV e' medial:    7.89 cm/s LV PW:         0.90 cm   LV E/e' medial:  9.4 LV IVS:        0.70 cm   LV e' lateral:   8.64 cm/s LVOT diam:     1.70 cm   LV E/e' lateral: 8.6 LV SV:         63 LV SV Index:   36 LVOT Area:     2.27 cm  RIGHT VENTRICLE RV S prime:     14.10 cm/s TAPSE (M-mode): 2.0 cm LEFT ATRIUM           Index        RIGHT ATRIUM           Index LA diam:      2.00 cm 1.12 cm/m   RA Area:     13.40 cm LA Vol (A2C): 12.3 ml 6.90 ml/m   RA Volume:   32.00 ml  17.95 ml/m LA Vol (A4C): 24.8 ml  13.91 ml/m  AORTIC VALVE LVOT Vmax:   113.00 cm/s LVOT Vmean:  80.400 cm/s LVOT VTI:    0.279 m  AORTA Ao Root diam: 3.20 cm Ao Asc diam:  3.30 cm MITRAL VALVE               TRICUSPID VALVE MV Area (PHT): 2.60 cm    TR Peak grad:   21.5 mmHg MV Decel Time: 292 msec    TR Vmax:        232.00 cm/s MV E velocity: 74.40 cm/s MV A velocity: 84.80 cm/s  SHUNTS MV E/A ratio:  0.88        Systemic VTI:  0.28 m                            Systemic Diam: 1.70 cm Rudean Haskell MD Electronically signed by Rudean Haskell MD Signature Date/Time: 07/16/2022/11:07:44 AM    Final  EEG adult  Result Date: 07/16/2022 Lora Havens, MD     07/16/2022  9:59 AM Patient Name: CARIAH TOBEY MRN: [id number] Epilepsy Attending: Lora Havens Referring Physician/Provider: Rolla Plate, DO Date: 07/16/2022 Duration: 28.02 mins Patient history: 67yo F with ams. EEG to evaluate for seizure Level of alertness: Awake AEDs during EEG study: None Technical aspects: This EEG study was done with scalp electrodes positioned according to the 10-20 International system of electrode placement. Electrical activity was reviewed with band pass filter of 1-70Hz , sensitivity of 7 uV/mm, display speed of 8mm/sec with a 60Hz  notched filter applied as appropriate. EEG data were recorded continuously and digitally stored.  Video monitoring was available and reviewed as appropriate. Description: The posterior dominant rhythm consists of 8 Hz activity of moderate voltage (25-35 uV) seen predominantly in posterior head regions, symmetric and reactive to eye opening and eye closing. EEG showed intermittent generalized 3 to 6 Hz theta-delta slowing. Hyperventilation and photic stimulation were not performed.   ABNORMALITY - Intermittent slow, generalized IMPRESSION: This study is suggestive of mild diffuse encephalopathy, nonspecific etiology. No seizures or epileptiform discharges were seen throughout the recording. Priyanka Barbra Sarks    CT ANGIO HEAD NECK W WO CM W PERF (CODE STROKE)  Result Date: 07/15/2022 CLINICAL DATA:  Neuro deficit, acute, stroke suspected. Altered mental status. Extremely lethargic. EXAM: CT ANGIOGRAPHY HEAD AND NECK CT PERFUSION BRAIN TECHNIQUE: Multidetector CT imaging of the head and neck was performed using the standard protocol during bolus administration of intravenous contrast. 3D post processing, including multiplanar CT image reconstructions and MIPs were obtained to evaluate the vascular anatomy. Carotid stenosis measurements (when applicable) are obtained utilizing NASCET criteria, using the distal internal carotid diameter as the denominator. Multiphase CT imaging of the brain was performed following IV bolus contrast injection. Subsequent parametric perfusion maps were calculated using RAPID software. RADIATION DOSE REDUCTION: This exam was performed according to the departmental dose-optimization program which includes automated exposure control, adjustment of the mA and/or kV according to patient size and/or use of iterative reconstruction technique. CONTRAST:  154mL OMNIPAQUE IOHEXOL 350 MG/ML SOLN COMPARISON:  Head CT 07/15/2022. FINDINGS: CTA NECK FINDINGS Aortic arch: Three-vessel arch configuration. Atherosclerotic calcifications of the aortic arch and arch vessel origins. Arch vessel origins are patent. Right carotid system: No evidence of dissection, stenosis (50% or greater) or occlusion. Left carotid system: No evidence of dissection, stenosis (50% or greater) or occlusion. Mild mixed plaque at the proximal left cervical ICA. Vertebral arteries: Right dominant. No evidence of dissection, stenosis or occlusion. Skeleton: No suspicious bone lesions. Other neck: Unremarkable. Upper chest: Emphysema in the lung apices. Review of the MIP images confirms the above findings CTA HEAD FINDINGS Anterior circulation: Intracranial ICAs are patent without stenosis or aneurysm. The proximal ACAs and MCAs are  patent without stenosis or aneurysm. Distal branches are symmetric. Posterior circulation: Normal basilar artery. The SCAs, AICAs and PICAs are patent proximally. Mild stenosis of the distal right PCA, P3 segment. Left PCA is patent without stenosis or aneurysm. Distal branches are symmetric. Venous sinuses: As permitted by contrast timing, patent. Anatomic variants: None. Review of the MIP images confirms the above findings CT Brain Perfusion Findings: ASPECTS: 10 CBF (<30%) Volume: 91mL Perfusion (Tmax>6.0s) volume: 35mL, favored to be artifact given patient motion during acquisition. Mismatch Volume: 74mL Infarction Location:Not applicable IMPRESSION: 1. No large vessel occlusion or significant stenosis of the head or neck vessels. 2. No evidence of core infarct or ischemia on CT perfusion.  3. Aortic Atherosclerosis (ICD10-I70.0). Electronically Signed   By: Emmit Alexanders M.D.   On: 07/15/2022 21:12   CT HEAD CODE STROKE WO CONTRAST  Result Date: 07/15/2022 CLINICAL DATA:  Code stroke. EXAM: CT HEAD WITHOUT CONTRAST TECHNIQUE: Contiguous axial images were obtained from the base of the skull through the vertex without intravenous contrast. RADIATION DOSE REDUCTION: This exam was performed according to the departmental dose-optimization program which includes automated exposure control, adjustment of the mA and/or kV according to patient size and/or use of iterative reconstruction technique. COMPARISON:  Prior CT from 02/22/2021. FINDINGS: Brain: Age-appropriate cerebral volume. Mild to moderate chronic microvascular ischemic disease seen involving the periventricular white matter. No acute intracranial hemorrhage. No acute large vessel territory infarct. No mass lesion or midline shift. No hydrocephalus or extra-axial fluid collection. Vascular: No abnormal hyperdense vessel. Scattered vascular calcifications noted within the carotid siphons. Skull: Scalp soft tissues demonstrate no acute finding. Calvarium  intact. Sinuses/Orbits: Globes orbital soft tissues within normal limits. Paranasal sinuses are largely clear. No mastoid effusion. Other: None. ASPECTS [facility] Stroke Program Early CT Score) - Ganglionic level infarction (caudate, lentiform nuclei, internal capsule, insula, M1-M3 cortex): 7 - Supraganglionic infarction (M4-M6 cortex): 3 Total score (0-10 with 10 being normal): 10 IMPRESSION: 1. No acute intracranial abnormality. 2. ASPECTS is 10. 3. Mild to moderate chronic microvascular ischemic disease. Critical Value/emergent results were called by telephone at the time of interpretation on 07/15/2022 at 8:18 pm to provider Godfrey Pick , who verbally acknowledged these results. Electronically Signed   By: Jeannine Boga M.D.   On: 07/15/2022 20:20    Today   Subjective    Zalma Mcclanahan today has no new complaints - , mentation has improved and now appears close to baseline          Patient has been seen and examined prior to discharge   Objective   Blood pressure 113/79, pulse 70, temperature 98.2 F (36.8 C), temperature source Oral, resp. rate (P) 14, height 5\' 5"  (1.651 m), weight 71 kg, SpO2 99 %.   Intake/Output Summary (Last 24 hours) at 07/16/2022 1430 Last data filed at 07/16/2022 0600 Gross per 24 hour  Intake 347.5 ml  Output 450 ml  Net -102.5 ml    Exam Gen:- Awake Alert, no acute distress , lipsmacking/tardive dyskinesia HEENT:- [place].AT, No sclera icterus Neck-Supple Neck,No JVD,.  Lungs-  CTAB , good air movement bilaterally CV- S1, S2 normal, regular Abd-  +ve B.Sounds, Abd Soft, No tenderness,    Extremity/Skin:- No  edema,   good pulses Psych-affect is appropriate, oriented x3, some memory and cognitive deficits which appears to be baseline Neuro-no new focal deficits, no tremors    Data Review   CBC w Diff:  Lab Results  Component Value Date   WBC 6.0 07/15/2022   HGB 17.7 (H) 07/15/2022   HGB 12.4 02/10/2019   HCT 52.0 (H) 07/15/2022   HCT 39.4  02/10/2019   PLT 180 07/15/2022   PLT 311 02/10/2019   LYMPHOPCT 57 07/15/2022   MONOPCT 7 07/15/2022   EOSPCT 2 07/15/2022   BASOPCT 0 07/15/2022    CMP:  Lab Results  Component Value Date   NA 136 07/16/2022   NA 136 02/10/2019   K 4.0 07/16/2022   CL 105 07/16/2022   CO2 24 07/16/2022   BUN 7 (L) 07/16/2022   BUN 8 02/10/2019   CREATININE 0.54 07/16/2022   CREATININE 0.76 08/16/2021   PROT 7.5 07/16/2022   PROT 8.0  02/10/2019   ALBUMIN 2.9 (L) 07/16/2022   ALBUMIN 4.3 02/10/2019   BILITOT 0.4 07/16/2022   BILITOT 0.4 02/10/2019   ALKPHOS 43 07/16/2022   AST 12 (L) 07/16/2022   ALT 9 07/16/2022   ALT 13 08/16/2021  .  Total Discharge time is about 33 minutes  Roxan Hockey M.D on 07/16/2022 at 2:30 PM  Go to [email or web address] -  for contact info  [facility] - Office  [phone]

## 2022-07-16 NOTE — TOC Transition Note (Signed)
Transition of Care [facility]) - CM/SW Discharge Note   Patient Details  Name: Karen Best MRN: [id number] Date of Birth: 1955/08/04  Transition of Care [facility]) CM/SW Contact:  Salome Arnt, LCSW Phone Number: 07/16/2022, 2:37 PM   Clinical Narrative: Pt d/c today. She is a long-term resident at [facility]. Per facility pt is interactive some days, but then others is "frozen" and won't respond. LCSW notified guardian, Lennette Bihari at [facility] of d/c. D/C summary sent to SNF. Will transport via [employer]. LCSW to arrange. Brandy at [facility] aware of d/c and no FL2 needed.       Final next level of care: [place] Barriers to Discharge: Barriers Resolved   Patient Goals and CMS Choice   Choice offered to / list presented to : [facility] POA / [place]  Discharge Placement                  Patient to be transferred to facility by: [facility] Name of family member notified: legal guardian - Lennette Bihari Patient and family notified of of transfer: 07/16/22  Discharge Plan and Services Additional resources added to the After Visit Summary for                                       Social Determinants of Health (SDOH) Interventions SDOH Screenings   Depression (PHQ2-9): Low Risk  (02/06/2022)  Tobacco Use: High Risk (07/15/2022)     Readmission Risk Interventions    02/25/2021    1:11 PM 09/25/2020    9:54 AM  Readmission Risk Prevention Plan  Transportation Screening Complete Complete  PCP or Specialist Appt within 5-7 Days  Complete  PCP or Specialist Appt within 3-5 Days Not Complete   Not Complete comments Three attempts made to call [facility]--phone rings and rings but no answer, cannot leave mesage.   Home Care Screening  Complete  Medication Review (RN CM)  Complete  HRI or Home Care Consult Complete   Social Work Consult for [place] Planning/Counseling Complete   Palliative Care Screening Not  Applicable

## 2022-07-16 NOTE — Progress Notes (Signed)
  Echocardiogram 2D Echocardiogram has been performed.  Karen Best 07/16/2022, 10:43 AM

## 2022-07-16 NOTE — Assessment & Plan Note (Signed)
Continue statin. 

## 2023-03-17 ENCOUNTER — Encounter (HOSPITAL_COMMUNITY): Payer: Self-pay | Admitting: Emergency Medicine

## 2023-03-17 ENCOUNTER — Emergency Department (HOSPITAL_COMMUNITY): Payer: Medicare Other

## 2023-03-17 ENCOUNTER — Inpatient Hospital Stay (HOSPITAL_COMMUNITY)
Admission: EM | Admit: 2023-03-17 | Discharge: 2023-03-25 | DRG: 871 | Disposition: A | Discharge status: Skilled Nursing Facility | Payer: Medicare Other | Source: Skilled Nursing Facility | Attending: Internal Medicine | Admitting: Internal Medicine

## 2023-03-17 ENCOUNTER — Other Ambulatory Visit: Payer: Self-pay

## 2023-03-17 DIAGNOSIS — A4151 Sepsis due to Escherichia coli [E. coli]: Secondary | ICD-10-CM | POA: Diagnosis not present

## 2023-03-17 DIAGNOSIS — Z888 Allergy status to other drugs, medicaments and biological substances status: Secondary | ICD-10-CM

## 2023-03-17 DIAGNOSIS — E782 Mixed hyperlipidemia: Secondary | ICD-10-CM | POA: Diagnosis present

## 2023-03-17 DIAGNOSIS — D509 Iron deficiency anemia, unspecified: Secondary | ICD-10-CM | POA: Diagnosis present

## 2023-03-17 DIAGNOSIS — F250 Schizoaffective disorder, bipolar type: Secondary | ICD-10-CM | POA: Diagnosis present

## 2023-03-17 DIAGNOSIS — Z8673 Personal history of transient ischemic attack (TIA), and cerebral infarction without residual deficits: Secondary | ICD-10-CM

## 2023-03-17 DIAGNOSIS — R4182 Altered mental status, unspecified: Principal | ICD-10-CM

## 2023-03-17 DIAGNOSIS — F17210 Nicotine dependence, cigarettes, uncomplicated: Secondary | ICD-10-CM | POA: Diagnosis present

## 2023-03-17 DIAGNOSIS — M797 Fibromyalgia: Secondary | ICD-10-CM | POA: Diagnosis present

## 2023-03-17 DIAGNOSIS — E872 Acidosis, unspecified: Secondary | ICD-10-CM | POA: Insufficient documentation

## 2023-03-17 DIAGNOSIS — I10 Essential (primary) hypertension: Secondary | ICD-10-CM | POA: Diagnosis present

## 2023-03-17 DIAGNOSIS — G2401 Drug induced subacute dyskinesia: Secondary | ICD-10-CM | POA: Diagnosis present

## 2023-03-17 DIAGNOSIS — Z79899 Other long term (current) drug therapy: Secondary | ICD-10-CM

## 2023-03-17 DIAGNOSIS — A419 Sepsis, unspecified organism: Secondary | ICD-10-CM | POA: Diagnosis not present

## 2023-03-17 DIAGNOSIS — Z1612 Extended spectrum beta lactamase (ESBL) resistance: Secondary | ICD-10-CM | POA: Diagnosis present

## 2023-03-17 DIAGNOSIS — G9341 Metabolic encephalopathy: Secondary | ICD-10-CM | POA: Diagnosis present

## 2023-03-17 DIAGNOSIS — R6520 Severe sepsis without septic shock: Secondary | ICD-10-CM | POA: Diagnosis present

## 2023-03-17 DIAGNOSIS — R718 Other abnormality of red blood cells: Secondary | ICD-10-CM | POA: Insufficient documentation

## 2023-03-17 DIAGNOSIS — N390 Urinary tract infection, site not specified: Secondary | ICD-10-CM | POA: Diagnosis present

## 2023-03-17 DIAGNOSIS — Z23 Encounter for immunization: Secondary | ICD-10-CM

## 2023-03-17 LAB — URINALYSIS, ROUTINE W REFLEX MICROSCOPIC
Bilirubin Urine: NEGATIVE
Glucose, UA: NEGATIVE mg/dL
Ketones, ur: 5 mg/dL — AB
Nitrite: POSITIVE — AB
Protein, ur: 100 mg/dL — AB
Specific Gravity, Urine: 1.019 (ref 1.005–1.030)
WBC, UA: >50 WBC/hpf (ref 0–5)
pH: 5 (ref 5.0–8.0)

## 2023-03-17 LAB — BASIC METABOLIC PANEL
Anion gap: 10 (ref 5–15)
BUN: 19 mg/dL (ref 8–23)
CO2: 23 mmol/L (ref 22–32)
Calcium: 9.7 mg/dL (ref 8.9–10.3)
Chloride: 100 mmol/L (ref 98–111)
Creatinine, Ser: 0.81 mg/dL (ref 0.44–1.00)
GFR, Estimated: >60 mL/min (ref >60)
Glucose, Bld: 115 mg/dL — ABNORMAL HIGH (ref 70–99)
Potassium: 4.4 mmol/L (ref 3.5–5.1)
Sodium: 133 mmol/L — ABNORMAL LOW (ref 135–145)

## 2023-03-17 LAB — CBC WITH DIFFERENTIAL/PLATELET
Abs Immature Granulocytes: 0.08 10*3/uL — ABNORMAL HIGH (ref 0.00–0.07)
Basophils Absolute: 0 10*3/uL (ref 0.0–0.1)
Basophils Relative: 0 %
Eosinophils Absolute: 0 10*3/uL (ref 0.0–0.5)
Eosinophils Relative: 0 %
HCT: 42.2 % (ref 36.0–46.0)
Hemoglobin: 13.1 g/dL (ref 12.0–15.0)
Immature Granulocytes: 1 %
Lymphocytes Relative: 15 %
Lymphs Abs: 2.1 10*3/uL (ref 0.7–4.0)
MCH: 23.6 pg — ABNORMAL LOW (ref 26.0–34.0)
MCHC: 31 g/dL (ref 30.0–36.0)
MCV: 76.2 fL — ABNORMAL LOW (ref 80.0–100.0)
Monocytes Absolute: 2.2 10*3/uL — ABNORMAL HIGH (ref 0.1–1.0)
Monocytes Relative: 15 %
Neutro Abs: 9.9 10*3/uL — ABNORMAL HIGH (ref 1.7–7.7)
Neutrophils Relative %: 69 %
Platelets: 163 10*3/uL (ref 150–400)
RBC: 5.54 MIL/uL — ABNORMAL HIGH (ref 3.87–5.11)
RDW: 15.3 % (ref 11.5–15.5)
WBC: 14.3 10*3/uL — ABNORMAL HIGH (ref 4.0–10.5)
nRBC: 0 % (ref 0.0–0.2)

## 2023-03-17 LAB — LACTIC ACID, PLASMA
Lactic Acid, Venous: 2.7 mmol/L (ref 0.5–1.9)
Lactic Acid, Venous: 2.1 mmol/L (ref 0.5–1.9)

## 2023-03-17 MED ORDER — SODIUM CHLORIDE 0.9 % IV SOLN
INTRAVENOUS | Status: AC
  Filled 2023-03-17: qty 10

## 2023-03-17 MED ORDER — ACETAMINOPHEN 650 MG RE SUPP
650.0000 mg | Freq: Once | RECTAL | Status: AC
Start: 2023-03-17 — End: 2023-03-17
  Administered 2023-03-17: 650 mg via RECTAL

## 2023-03-17 MED ORDER — ACETAMINOPHEN 650 MG RE SUPP
RECTAL | Status: AC
  Filled 2023-03-17: qty 1

## 2023-03-17 MED ORDER — SODIUM CHLORIDE 0.9 % IV SOLN
1.0000 g | Freq: Once | INTRAVENOUS | Status: AC
  Administered 2023-03-17: 1 g via INTRAVENOUS

## 2023-03-17 MED ORDER — SODIUM CHLORIDE 0.9 % IV BOLUS
1000.0000 mL | Freq: Once | INTRAVENOUS | Status: AC
  Administered 2023-03-17: 1000 mL via INTRAVENOUS

## 2023-03-17 NOTE — ED Provider Notes (Signed)
[place] EMERGENCY DEPARTMENT AT [facility] Provider Note   CSN: [id number] Arrival date & time: 03/17/23  2019     History {Add pertinent medical, surgical, social history, OB history to HPI:1} Chief Complaint  Patient presents with   Altered Mental Status    Karen Best is a 67 y.o. female.  With a history of CVA and metabolic encephalopathy who presented to the ED for altered mental status.  Brought in by EMS from skilled nursing facility where staff voiced concern for confusion.  She is typically conversational but has been less interactive today.  Was recently found to have a UTI and received her first dose of ceftriaxone earlier today.  Noted to be febrile on arrival.  Patient is able to respond yes no but cannot contribute additional history secondary to clinical condition.  Denies chest pain abdominal pain and headaches.  History significant limited at this time   Altered Mental Status      Home Medications Prior to Admission medications   Medication Sig Start Date End Date Taking? Authorizing Provider  clonazePAM (KLONOPIN) 0.5 MG tablet Take 0.5 mg by mouth daily. 03/09/23  Yes [provider]  INGREZZA 40 MG capsule Take 40 mg by mouth daily. 11/20/22  Yes [provider]  mirtazapine (REMERON) 7.5 MG tablet Take 7.5 mg by mouth at bedtime. 01/20/23  Yes [provider]  rosuvastatin (CRESTOR) 10 MG tablet Take 10 mg by mouth at bedtime. 01/20/23  Yes [provider]  traMADol (ULTRAM) 50 MG tablet Take 50 mg by mouth 3 (three) times daily as needed. 12/08/22  Yes [provider]  acetaminophen (TYLENOL) 325 MG tablet Take 650 mg by mouth every 6 (six) hours as needed.    [provider]  atorvastatin (LIPITOR) 10 MG tablet Take 10 mg by mouth daily.    [provider]  cefTRIAXone (ROCEPHIN) 2 g injection Inject 2 g into the muscle daily. 03/17/23   [provider]  divalproex  (DEPAKOTE) 125 MG DR tablet Take 3 tablets (375 mg total) by mouth 2 (two) times daily. 07/16/22 07/16/23  Shon Hale, MD  hydrOXYzine (ATARAX) 25 MG tablet Take 1 tablet (25 mg total) by mouth every 8 (eight) hours as needed for itching, anxiety or nausea. 07/16/22   Shon Hale, MD  OLANZapine (ZYPREXA) 5 MG tablet Take 1 tablet (5 mg total) by mouth at bedtime. 07/16/22   Shon Hale, MD  polyethylene glycol (MIRALAX) 17 g packet Take 17 g by mouth daily. 07/16/22   Shon Hale, MD  senna-docusate (SENOKOT-S) 8.6-50 MG tablet Take 2 tablets by mouth at bedtime. 07/16/22 07/16/23  Shon Hale, MD      Allergies    Haloperidol and related    Review of Systems   Review of Systems  Physical Exam Updated Vital Signs BP 137/83 (BP Location: Right Arm)   Pulse 92   Temp (!) 103 F (39.4 C) (Oral)   Resp 16   Ht 5\' 5"  (1.651 m)   Wt 71 kg   SpO2 97%   BMI 26.05 kg/m  Physical Exam Vitals and nursing note reviewed.  HENT:     Head: Normocephalic and atraumatic.  Eyes:     Pupils: Pupils are equal, round, and reactive to light.  Cardiovascular:     Rate and Rhythm: Normal rate and regular rhythm.  Pulmonary:     Effort: Pulmonary effort is normal.     Breath sounds: Normal breath sounds.  Abdominal:  General: There is no distension.     Palpations: Abdomen is soft.     Tenderness: There is no abdominal tenderness.  Skin:    General: Skin is warm and dry.  Neurological:     Mental Status: She is alert.     Comments: Able to answer yes/no questions Not oriented to self place time or situation Moving all 4 extremities  Psychiatric:        Mood and Affect: Mood normal.     ED Results / Procedures / Treatments   Labs (all labs ordered are listed, but only abnormal results are displayed) Labs Reviewed  BASIC METABOLIC PANEL - Abnormal; Notable for the following components:      Result Value   Sodium 133 (*)    Glucose, Bld 115 (*)    All other  components within normal limits  CBC WITH DIFFERENTIAL/PLATELET - Abnormal; Notable for the following components:   WBC 14.3 (*)    RBC 5.54 (*)    MCV 76.2 (*)    MCH 23.6 (*)    Neutro Abs 9.9 (*)    Monocytes Absolute 2.2 (*)    Abs Immature Granulocytes 0.08 (*)    All other components within normal limits  LACTIC ACID, PLASMA - Abnormal; Notable for the following components:   Lactic Acid, Venous 2.7 (*)    All other components within normal limits  LACTIC ACID, PLASMA  URINALYSIS, ROUTINE W REFLEX MICROSCOPIC    EKG None  Radiology No results found.  Procedures Procedures  {Document cardiac monitor, telemetry assessment procedure when appropriate:1}  Medications Ordered in ED Medications  sodium chloride 0.9 % bolus 1,000 mL (has no administration in time range)  cefTRIAXone (ROCEPHIN) 1 g in sodium chloride 0.9 % 100 mL IVPB (has no administration in time range)  acetaminophen (TYLENOL) suppository 650 mg (650 mg Rectal Given 03/17/23 2209)    ED Course/ Medical Decision Making/ A&P   {   Click here for ABCD2, HEART and other calculatorsREFRESH Note before signing :1}                              Medical Decision Making 67 year old female with history as above presenting from skilled nursing facility given concern for altered mental status.  Recently found to have UTI and started ceftriaxone today.  Febrile upon arrival and altered from baseline.  She is typically conversational but is only able to answer yes/no questions.  Does have a history of stroke but no new focal neurologic deficits on exam.  Most likely etiology of altered mental status would be infection with urinary source.  Will obtain infectious workup including blood cultures and lactate, provide IV antibiotics and IV fluids and admit to medicine.  Amount and/or Complexity of Data Reviewed Labs: ordered. Radiology: ordered.  Risk OTC drugs.   ***  {Document critical care time when  appropriate:1} {Document review of labs and clinical decision tools ie heart score, Chads2Vasc2 etc:1}  {Document your independent review of radiology images, and any outside records:1} {Document your discussion with family members, caretakers, and with consultants:1} {Document social determinants of health affecting pt's care:1} {Document your decision making why or why not admission, treatments were needed:1} Final Clinical Impression(s) / ED Diagnoses Final diagnoses:  None    Rx / DC Orders ED Discharge Orders     None

## 2023-03-17 NOTE — ED Notes (Signed)
Patient transported to X-ray 

## 2023-03-17 NOTE — ED Notes (Signed)
Pt kept right arm bent. Placed new IV on right side; started rocephin & NS after obtaining new line as well as 2nd set of blood cultures.

## 2023-03-17 NOTE — ED Triage Notes (Signed)
Pt usually talks was diagnosed with uti today and given rocephin. Facility states pt is not her norm and last known well unknown.

## 2023-03-18 DIAGNOSIS — D509 Iron deficiency anemia, unspecified: Secondary | ICD-10-CM | POA: Diagnosis present

## 2023-03-18 DIAGNOSIS — A419 Sepsis, unspecified organism: Secondary | ICD-10-CM | POA: Diagnosis present

## 2023-03-18 DIAGNOSIS — G9341 Metabolic encephalopathy: Secondary | ICD-10-CM

## 2023-03-18 DIAGNOSIS — Z888 Allergy status to other drugs, medicaments and biological substances status: Secondary | ICD-10-CM | POA: Diagnosis not present

## 2023-03-18 DIAGNOSIS — N390 Urinary tract infection, site not specified: Secondary | ICD-10-CM

## 2023-03-18 DIAGNOSIS — E782 Mixed hyperlipidemia: Secondary | ICD-10-CM | POA: Diagnosis present

## 2023-03-18 DIAGNOSIS — G2401 Drug induced subacute dyskinesia: Secondary | ICD-10-CM | POA: Diagnosis present

## 2023-03-18 DIAGNOSIS — F250 Schizoaffective disorder, bipolar type: Secondary | ICD-10-CM | POA: Diagnosis present

## 2023-03-18 DIAGNOSIS — M797 Fibromyalgia: Secondary | ICD-10-CM | POA: Diagnosis present

## 2023-03-18 DIAGNOSIS — E872 Acidosis, unspecified: Secondary | ICD-10-CM | POA: Diagnosis not present

## 2023-03-18 DIAGNOSIS — R718 Other abnormality of red blood cells: Secondary | ICD-10-CM | POA: Insufficient documentation

## 2023-03-18 DIAGNOSIS — R7881 Bacteremia: Secondary | ICD-10-CM | POA: Diagnosis not present

## 2023-03-18 DIAGNOSIS — Z8673 Personal history of transient ischemic attack (TIA), and cerebral infarction without residual deficits: Secondary | ICD-10-CM | POA: Diagnosis not present

## 2023-03-18 DIAGNOSIS — Z1612 Extended spectrum beta lactamase (ESBL) resistance: Secondary | ICD-10-CM | POA: Diagnosis present

## 2023-03-18 DIAGNOSIS — A4151 Sepsis due to Escherichia coli [E. coli]: Secondary | ICD-10-CM | POA: Diagnosis present

## 2023-03-18 DIAGNOSIS — F17210 Nicotine dependence, cigarettes, uncomplicated: Secondary | ICD-10-CM | POA: Diagnosis present

## 2023-03-18 DIAGNOSIS — Z79899 Other long term (current) drug therapy: Secondary | ICD-10-CM | POA: Diagnosis not present

## 2023-03-18 DIAGNOSIS — R6520 Severe sepsis without septic shock: Secondary | ICD-10-CM | POA: Diagnosis present

## 2023-03-18 DIAGNOSIS — Z23 Encounter for immunization: Secondary | ICD-10-CM | POA: Diagnosis present

## 2023-03-18 DIAGNOSIS — I10 Essential (primary) hypertension: Secondary | ICD-10-CM | POA: Diagnosis present

## 2023-03-18 LAB — BLOOD CULTURE ID PANEL (REFLEXED) - BCID2

## 2023-03-18 LAB — MAGNESIUM: Magnesium: 2 mg/dL (ref 1.7–2.4)

## 2023-03-18 LAB — URINALYSIS, W/ REFLEX TO CULTURE (INFECTION SUSPECTED)
Bacteria, UA: NONE SEEN
Bilirubin Urine: NEGATIVE
Glucose, UA: NEGATIVE mg/dL
Ketones, ur: 5 mg/dL — AB
Nitrite: NEGATIVE
Protein, ur: 100 mg/dL — AB
Specific Gravity, Urine: 1.017 (ref 1.005–1.030)
WBC, UA: >50 WBC/hpf (ref 0–5)
pH: 7 (ref 5.0–8.0)

## 2023-03-18 LAB — FERRITIN: Ferritin: 102 ng/mL (ref 11–307)

## 2023-03-18 LAB — COMPREHENSIVE METABOLIC PANEL
ALT: 15 U/L (ref 0–44)
AST: 20 U/L (ref 15–41)
Albumin: 2.6 g/dL — ABNORMAL LOW (ref 3.5–5.0)
Alkaline Phosphatase: 43 U/L (ref 38–126)
Anion gap: 10 (ref 5–15)
BUN: 17 mg/dL (ref 8–23)
CO2: 20 mmol/L — ABNORMAL LOW (ref 22–32)
Calcium: 9.1 mg/dL (ref 8.9–10.3)
Chloride: 104 mmol/L (ref 98–111)
Creatinine, Ser: 0.73 mg/dL (ref 0.44–1.00)
GFR, Estimated: >60 mL/min (ref >60)
Glucose, Bld: 105 mg/dL — ABNORMAL HIGH (ref 70–99)
Potassium: 4.3 mmol/L (ref 3.5–5.1)
Sodium: 134 mmol/L — ABNORMAL LOW (ref 135–145)
Total Bilirubin: 0.4 mg/dL (ref <1.2)
Total Protein: 7.7 g/dL (ref 6.5–8.1)

## 2023-03-18 LAB — IRON AND TIBC
Iron: 16 ug/dL — ABNORMAL LOW (ref 28–170)
Saturation Ratios: 7 % — ABNORMAL LOW (ref 10.4–31.8)
TIBC: 241 ug/dL — ABNORMAL LOW (ref 250–450)
UIBC: 225 ug/dL

## 2023-03-18 LAB — HIV ANTIBODY (ROUTINE TESTING W REFLEX): HIV Screen 4th Generation wRfx: NONREACTIVE

## 2023-03-18 LAB — CBC
HCT: 39.2 % (ref 36.0–46.0)
Hemoglobin: 12 g/dL (ref 12.0–15.0)
MCH: 23.5 pg — ABNORMAL LOW (ref 26.0–34.0)
MCHC: 30.6 g/dL (ref 30.0–36.0)
MCV: 76.7 fL — ABNORMAL LOW (ref 80.0–100.0)
Platelets: 143 10*3/uL — ABNORMAL LOW (ref 150–400)
RBC: 5.11 MIL/uL (ref 3.87–5.11)
RDW: 15.1 % (ref 11.5–15.5)
WBC: 11 10*3/uL — ABNORMAL HIGH (ref 4.0–10.5)
nRBC: 0 % (ref 0.0–0.2)

## 2023-03-18 LAB — PHOSPHORUS: Phosphorus: 2.4 mg/dL — ABNORMAL LOW (ref 2.5–4.6)

## 2023-03-18 LAB — LACTIC ACID, PLASMA
Lactic Acid, Venous: 2.5 mmol/L (ref 0.5–1.9)
Lactic Acid, Venous: 1.7 mmol/L (ref 0.5–1.9)

## 2023-03-18 MED ORDER — ONDANSETRON HCL 4 MG PO TABS: 4.0000 mg | ORAL_TABLET | Freq: Four times a day (QID) | ORAL | Status: DC | PRN

## 2023-03-18 MED ORDER — SODIUM CHLORIDE 0.9 % IV SOLN
2.0000 g | INTRAVENOUS | Status: DC
  Administered 2023-03-18: 2 g via INTRAVENOUS
  Filled 2023-03-18: qty 20

## 2023-03-18 MED ORDER — ACETAMINOPHEN 650 MG RE SUPP
650.0000 mg | Freq: Four times a day (QID) | RECTAL | Status: DC | PRN
  Administered 2023-03-18: 650 mg via RECTAL
  Filled 2023-03-18: qty 1

## 2023-03-18 MED ORDER — ROSUVASTATIN CALCIUM 10 MG PO TABS
10.0000 mg | ORAL_TABLET | Freq: Every day | ORAL | Status: DC
  Administered 2023-03-18 – 2023-03-24 (×7): 10 mg via ORAL
  Filled 2023-03-18 (×7): qty 1

## 2023-03-18 MED ORDER — LACTATED RINGERS IV SOLN
INTRAVENOUS | Status: DC
  Administered 2023-03-18: 1000 mL via INTRAVENOUS

## 2023-03-18 MED ORDER — SODIUM CHLORIDE 0.9 % IV SOLN: 1.0000 g | INTRAVENOUS | Status: DC

## 2023-03-18 MED ORDER — ENOXAPARIN SODIUM 40 MG/0.4ML IJ SOSY
40.0000 mg | PREFILLED_SYRINGE | Freq: Every day | INTRAMUSCULAR | Status: DC
  Administered 2023-03-18 – 2023-03-25 (×8): 40 mg via SUBCUTANEOUS
  Filled 2023-03-18 (×8): qty 0.4

## 2023-03-18 MED ORDER — VALBENAZINE TOSYLATE 40 MG PO CAPS
40.0000 mg | ORAL_CAPSULE | Freq: Every day | ORAL | Status: DC
  Administered 2023-03-18 – 2023-03-20 (×3): 40 mg via ORAL
  Filled 2023-03-18 (×6): qty 1

## 2023-03-18 MED ORDER — LACTATED RINGERS IV SOLN: INTRAVENOUS | Status: AC

## 2023-03-18 MED ORDER — SODIUM CHLORIDE 0.9 % IV SOLN
1.0000 g | Freq: Three times a day (TID) | INTRAVENOUS | Status: AC
Start: 2023-03-18 — End: 2023-03-25
  Administered 2023-03-18 – 2023-03-25 (×21): 1 g via INTRAVENOUS
  Filled 2023-03-18 (×19): qty 20

## 2023-03-18 MED ORDER — OLANZAPINE 5 MG PO TABS
5.0000 mg | ORAL_TABLET | Freq: Every day | ORAL | Status: DC
  Administered 2023-03-18 – 2023-03-24 (×7): 5 mg via ORAL
  Filled 2023-03-18 (×7): qty 1

## 2023-03-18 MED ORDER — CLONAZEPAM 0.5 MG PO TABS
0.5000 mg | ORAL_TABLET | Freq: Every day | ORAL | Status: DC
  Administered 2023-03-18 – 2023-03-25 (×8): 0.5 mg via ORAL
  Filled 2023-03-18 (×8): qty 1

## 2023-03-18 MED ORDER — ONDANSETRON HCL 4 MG/2ML IJ SOLN: 4.0000 mg | Freq: Four times a day (QID) | INTRAMUSCULAR | Status: DC | PRN

## 2023-03-18 MED ORDER — ACETAMINOPHEN 325 MG PO TABS
650.0000 mg | ORAL_TABLET | Freq: Four times a day (QID) | ORAL | Status: DC | PRN
  Administered 2023-03-19 – 2023-03-24 (×7): 650 mg via ORAL
  Filled 2023-03-18 (×10): qty 2

## 2023-03-18 MED ORDER — DIVALPROEX SODIUM 250 MG PO DR TAB
375.0000 mg | DELAYED_RELEASE_TABLET | Freq: Two times a day (BID) | ORAL | Status: DC
  Administered 2023-03-18 – 2023-03-25 (×14): 375 mg via ORAL
  Filled 2023-03-18 (×19): qty 1

## 2023-03-18 NOTE — TOC Initial Note (Signed)
Transition of Care [facility]) - Initial/Assessment Note    Patient Details  Name: Karen Best MRN: [id number] Date of Birth: 1956/03/02  Transition of [facility]) CM/SW Contact:    Karn Cassis, LCSW Phone Number: 03/18/2023, 12:35 PM  Clinical Narrative: Pt admitted due to sepsis secondary to UTI. Per Whitney Post at [facility], pt is long term resident. Her guardian is Cassell Clement with [facility] DSS. Caryn Bee confirms plan to return to [facility] when d/c. Whitney Post indicates they have been unable to get pt's Ingrezza for a couple weeks due to insurance denial. MD notified. FL2 completed. TOC will follow.                   Expected Discharge Plan: Long Term Nursing Home Barriers to Discharge: Continued Medical Work up   Patient Goals and CMS Choice Patient states their goals for this hospitalization and ongoing recovery are:: return to LTC SNF   Choice offered to / list presented to : [facility] POA / Guardian  ownership interest in [facility].provided to::  (n/a)    Expected Discharge Plan and Services In-house Referral: Clinical Social Work   Post Acute Care Choice: Nursing Home Living arrangements for the past 2 months: Skilled Nursing Facility                                      Prior Living Arrangements/Services Living arrangements for the past 2 months: Skilled Nursing Facility Lives with:: Facility Resident Patient language and need for interpreter reviewed:: Yes Do you feel safe going back to the place where you live?: Yes        Care giver support system in place?: Yes (comment)   Criminal Activity/Legal Involvement Pertinent to Current Situation/Hospitalization: No - Comment as needed  Activities of Daily Living      Permission Sought/Granted                  Emotional Assessment   Attitude/Demeanor/Rapport: Unable to Assess   Orientation: : Oriented to Self Alcohol / Substance Use: Not Applicable Psych  Involvement: No (comment)  Admission diagnosis:  Sepsis due to urinary tract infection (HCC) [A41.9, N39.0] Urinary tract infection without hematuria, site unspecified [N39.0] Altered mental status, unspecified altered mental status type [R41.82] Patient Active Problem List   Diagnosis Date Noted   Sepsis due to urinary tract infection (HCC) 03/18/2023   Lactic acidosis 03/18/2023   Low mean corpuscular volume (MCV) 03/18/2023   Mixed hyperlipidemia 07/16/2022   Valproic acid toxicity 02/22/2021   Malnutrition of moderate degree 11/10/2020   Hypertension    Acute encephalopathy 09/18/2020   Schizoaffective disorder, bipolar type (HCC) 05/13/2020   Bacteremia due to Staphylococcus aureus 10/03/2019   AKI (acute kidney injury) (HCC) 10/02/2019   Transaminitis 10/02/2019   CVA (cerebral vascular accident) (HCC) 10/02/2019   AMS (altered mental status) 10/01/2019   Lithium toxicity 02/16/2019   Hyperkalemia 02/16/2019   Acute confusion 12/29/2018   Toxic metabolic encephalopathy 12/24/2018   Asthma 12/23/2018   Delirium due to another medical condition    Acute metabolic encephalopathy 12/06/2018   Chronic hepatitis C without hepatic coma (HCC) 06/11/2018   PCP:  Inc, [facility] Pharmacy:   [id number] [place], [place] - [address] [place] [address] Phone: [phone] Fax: [phone]  [facility]  [address], [address] [place] [address] Phone: [phone] Fax: [phone]  [facility] [address] [place] [address] Phone: [phone] Fax: [phone]  [facility] - [place], [place] - [address] [place] [address] Phone: [phone] Fax: [phone]     Social Determinants of Health (SDOH) Social History: SDOH Screenings   Food Insecurity: Patient  Unable To Answer (03/18/2023)  Housing: Patient Unable To Answer (03/18/2023)  Transportation Needs: Patient Unable To Answer (03/18/2023)  Utilities: Patient Unable To Answer (03/18/2023)  Depression (PHQ2-9): Low Risk  (02/06/2022)  Tobacco Use: High Risk (03/17/2023)   SDOH Interventions:     Readmission Risk Interventions    02/25/2021    1:11 PM 09/25/2020    9:54 AM  Readmission Risk Prevention Plan  Transportation Screening Complete Complete  PCP or Specialist Appt within 5-7 Days  Complete  PCP or Specialist Appt within 3-5 Days Not Complete   Not Complete comments Three attempts made to call [facility]--phone rings and rings but no answer, cannot leave mesage.   Home Care Screening  Complete  Medication Review (RN CM)  Complete  HRI or Home Care Consult Complete   Social Work Consult for Recovery Care Planning/Counseling Complete   Palliative Care Screening Not Applicable

## 2023-03-18 NOTE — Plan of Care (Signed)
Pt from 'SNF'.Pt is alert only to self. Pt sleeping after arriving on floor. Pt unable to verbal answer questions on admissions assessment.Will collect urine sample per doc's order for suspected infection.

## 2023-03-18 NOTE — Plan of Care (Signed)
  Problem: Education: Goal: Knowledge of General Education information will improve Description Including pain rating scale, medication(s)/side effects and non-pharmacologic comfort measures Outcome: Progressing   Problem: Health Behavior/Discharge Planning: Goal: Ability to manage health-related needs will improve Outcome: Progressing   

## 2023-03-18 NOTE — Progress Notes (Signed)
Remains Yellow mews vitals routinely , assumed care of patient rechecked her VS are as follows  03/18/23 1454  Assess: MEWS Score  Temp (!) 100.7 F (38.2 C)  BP 107/60  MAP (mmHg) 75  Pulse Rate 76  Resp 20  Level of Consciousness Alert  SpO2 94 %  O2 Device Room Air  Assess: MEWS Score  MEWS Temp 1  MEWS Systolic 0  MEWS Pulse 0  MEWS RR 0  MEWS LOC 0  MEWS Score 1  MEWS Score Color Green  Assess: SIRS CRITERIA  SIRS Temperature  0  SIRS Pulse 0  SIRS Respirations  0  SIRS WBC 0  SIRS Score Sum  0

## 2023-03-18 NOTE — Progress Notes (Signed)
Microbiology called , patient has positive blood culture on 1 of 4 bottles, also positive for Crxm gene. Detected notified Dr. Laural Benes

## 2023-03-18 NOTE — Consult Note (Signed)
Pharmacy Antibiotic Note  ASSESSMENT: 67 y.o. female with PMH HLD, constipation, schizoaffective disorder is presenting with bacteremia. Blood culture growing ESBL E. Coli, thought be to secondary to UTI. Pharmacy has been consulted to manage meropenem dosing.  Patient measurements: Height: 5\' 5"  (165.1 cm) Weight: 71 kg (156 lb 8.4 oz) IBW/kg (Calculated) : 57  Vital signs: Temp: 100.7 F (38.2 C) (11/20 1454) Temp Source: Axillary (11/20 1454) BP: 107/60 (11/20 1454) Pulse Rate: 76 (11/20 1454) Recent Labs  Lab 03/17/23 2047 03/18/23 0615  WBC 14.3* 11.0*  CREATININE 0.81 0.73   Estimated Creatinine Clearance: 68.4 mL/min (by C-G formula based on SCr of 0.73 mg/dL).  Allergies: Allergies  Allergen Reactions   Haloperidol And Related     Extrapyramidal symptoms    Antimicrobials this admission: Ceftriaxone 11/19 >> 11/20 Meropenem 11/20 >>  Dose adjustments this admission: N/A  Microbiology results: 11/19 BCx: 1 of 4 bottles (anaerobic), ESBL E. coli  PLAN: Discontinue ceftriaxone Initiate meropenem 1 g IV q8H Follow up susceptibilities to assess for antibiotic optimization. Monitor renal function to assess for any necessary antibiotic dosing changes.   Thank you for allowing pharmacy to be a part of this patient's care.  Will M. Dareen Piano, PharmD Clinical Pharmacist 03/18/2023 6:36 PM

## 2023-03-18 NOTE — Progress Notes (Signed)
Patient VS triggered yellow mews. Patient does not appear to be in any distress at this time. Notified Janan Ridge, Consulting civil engineer. Dr. Laural Benes and Tamera Punt, RN (RN taking care of pt). No new orders at this time. Yellow mews protocol initiated.     03/18/23 1454  Assess: MEWS Score  Temp (!) 100.7 F (38.2 C)  BP 107/60  MAP (mmHg) 75  Pulse Rate 76  Resp 20  Level of Consciousness Alert  SpO2 94 %  O2 Device Room Air  Assess: MEWS Score  MEWS Temp 1  MEWS Systolic 0  MEWS Pulse 0  MEWS RR 0  MEWS LOC 0  MEWS Score 1  MEWS Score Color Green  Assess: SIRS CRITERIA  SIRS Temperature  0  SIRS Pulse 0  SIRS Respirations  0  SIRS WBC 0  SIRS Score Sum  0

## 2023-03-18 NOTE — H&P (Signed)
History and Physical    Patient: Karen Best EXB:[id number] DOB: November 30, 1955 DOA: 03/17/2023 DOS: the patient was seen and examined on 03/18/2023 PCP: [facility], [facility]  Patient coming from: SNF  Chief Complaint:  Chief Complaint  Patient presents with   Altered Mental Status   HPI: Karen Best is a 67 y.o. female with medical history significant of hyperlipidemia, constipation, schizoaffective disorder history of stroke who presents to the emergency department via EMS from SNF due to altered mental status.  Patient was unable to provide history due to altered mental status, history was obtained from ED physician and ED medical record.  Per report, she was noted to be less interactive yesterday, she was usually very conversational at baseline.  Patient was recently diagnosed to have UTI and received a first dose of ceftriaxone yesterday.  EMS was activated and patient was taken to the ED for further evaluation and management. At bedside, patient was able to state her name Stumbaugh), but was unable to provide further history.   ED Course:  In the emergency department, she was febrile with a temperature of 103F, but other vital signs were within normal range.  Workup in the ED showed leukocytosis and MCV of 76.2, BMP was normal except for sodium of 133 and blood glucose of 115.  Lactic acid 2.7 > 2.1, urinalysis was positive for UTI.  Blood culture pending Chest x-ray showed no acute abnormality Patient was treated with IV ceftriaxone, IV hydration was provided, Tylenol was given due to fever.  Hospitalist was asked to admit patient for further evaluation and management.  Review of Systems: Review of systems as noted in the HPI. All other systems reviewed and are negative.   Past Medical History:  Diagnosis Date   Asthma    Bipolar 1 disorder (HCC)    Fibromyalgia    Hypertension    Schizophrenia (HCC)    Tobacco abuse    Past Surgical History:   Procedure Laterality Date   CESAREAN SECTION     TUBAL LIGATION      Social History:  reports that she has been smoking cigarettes. She has quit using smokeless tobacco. She reports current alcohol use. She reports current drug use. Drug: Cocaine.   Allergies  Allergen Reactions   Haloperidol And Related     Extrapyramidal symptoms    Family History  Problem Relation Age of Onset   Liver cancer Neg Hx    Liver disease Neg Hx      Prior to Admission medications   Medication Sig Start Date End Date Taking? Authorizing Provider  clonazePAM (KLONOPIN) 0.5 MG tablet Take 0.5 mg by mouth daily. 03/09/23  Yes [provider]  INGREZZA 40 MG capsule Take 40 mg by mouth daily. 11/20/22  Yes [provider]  mirtazapine (REMERON) 7.5 MG tablet Take 7.5 mg by mouth at bedtime. 01/20/23  Yes [provider]  rosuvastatin (CRESTOR) 10 MG tablet Take 10 mg by mouth at bedtime. 01/20/23  Yes [provider]  traMADol (ULTRAM) 50 MG tablet Take 50 mg by mouth 3 (three) times daily as needed. 12/08/22  Yes [provider]  acetaminophen (TYLENOL) 325 MG tablet Take 650 mg by mouth every 6 (six) hours as needed.    [provider]  atorvastatin (LIPITOR) 10 MG tablet Take 10 mg by mouth daily.    [provider]  cefTRIAXone (ROCEPHIN) 2 g injection Inject 2 g into the muscle daily. 03/17/23   [provider]  divalproex (DEPAKOTE) 125 MG DR tablet Take 3 tablets (375 mg total) by mouth 2 (two) times daily. 07/16/22 07/16/23  Shon Hale, MD  hydrOXYzine (ATARAX) 25 MG tablet Take 1 tablet (25 mg total) by mouth every 8 (eight) hours as needed for itching, anxiety or nausea. 07/16/22   Shon Hale, MD  OLANZapine (ZYPREXA) 5 MG tablet Take 1 tablet (5 mg total) by mouth at bedtime. 07/16/22   Shon Hale, MD  polyethylene glycol (MIRALAX) 17 g packet Take 17 g by mouth daily. 07/16/22   Shon Hale, MD   senna-docusate (SENOKOT-S) 8.6-50 MG tablet Take 2 tablets by mouth at bedtime. 07/16/22 07/16/23  Shon Hale, MD    Physical Exam: BP 115/80 (BP Location: Right Arm)   Pulse 81   Temp 99.7 F (37.6 C) (Oral)   Resp 18   Ht 5\' 5"  (1.651 m)   Wt 71 kg   SpO2 99%   BMI 26.05 kg/m   General: 67 y.o. year-old female well developed well nourished in no acute distress.   HEENT: NCAT, EOMI Neck: Supple, trachea medial Cardiovascular: Regular rate and rhythm with no rubs or gallops.  No thyromegaly or JVD noted. 2/4 pulses in all 4 extremities. Respiratory: Clear to auscultation with no wheezes or rales. Good inspiratory effort. Abdomen: Soft, nontender nondistended with normal bowel sounds x4 quadrants. Muskuloskeletal: No cyanosis, clubbing noted bilaterally Neuro: Patient was able to state her name and give a yes/no answers to questions  Skin: No ulcerative lesions noted or rashes Psychiatry: Mood is appropriate for condition and setting          Labs on Admission:  Basic Metabolic Panel: Recent Labs  Lab 03/17/23 2047  NA 133*  K 4.4  CL 100  CO2 23  GLUCOSE 115*  BUN 19  CREATININE 0.81  CALCIUM 9.7   Liver Function Tests: No results for input(s): "AST", "ALT", "ALKPHOS", "BILITOT", "PROT", "ALBUMIN" in the last 168 hours. No results for input(s): "LIPASE", "AMYLASE" in the last 168 hours. No results for input(s): "AMMONIA" in the last 168 hours. CBC: Recent Labs  Lab 03/17/23 2047  WBC 14.3*  NEUTROABS 9.9*  HGB 13.1  HCT 42.2  MCV 76.2*  PLT 163   Cardiac Enzymes: No results for input(s): "CKTOTAL", "CKMB", "CKMBINDEX", "TROPONINI" in the last 168 hours.  BNP (last 3 results) No results for input(s): "BNP" in the last 8760 hours.  ProBNP (last 3 results) No results for input(s): "PROBNP" in the last 8760 hours.  CBG: No results for input(s): "GLUCAP" in the last 168 hours.  Radiological Exams on Admission: DG Chest Portable 1 View  Result  Date: 03/18/2023 CLINICAL DATA:  Possible pneumonia EXAM: PORTABLE CHEST 1 VIEW COMPARISON:  07/16/2022 FINDINGS: Cardiac shadow is within normal limits. The lungs are well aerated bilaterally. No focal infiltrate or effusion is seen. No bony abnormality is noted. IMPRESSION: No acute abnormality noted. Electronically Signed   By: Alcide Clever M.D.   On: 03/18/2023 00:03    EKG: I independently viewed the EKG done and my findings are as followed: Normal sinus rhythm at a rate of 96 bpm  Assessment/Plan Present on Admission:  Sepsis due to urinary tract infection (HCC)  Acute metabolic encephalopathy  Mixed hyperlipidemia  Schizoaffective disorder, bipolar type (HCC)  Principal Problem:   Sepsis due to urinary tract infection (HCC) Active Problems:   Schizoaffective disorder, bipolar type (HCC)   Acute metabolic encephalopathy   Mixed hyperlipidemia   Lactic acidosis  Low mean corpuscular volume (MCV)  Severe sepsis due to UTI Patient met sepsis criteria due to being febrile and having leukocytosis (met SIRS criteria), source of infection was UTI, thereby meeting sepsis criteria.  Lactic acid was greater than 2.0, though admitting severe sepsis criteria. Patient was started on IV ceftriaxone, we shall continue with same at this time Continue Tylenol as needed for fever Blood and urine cultures pending  Lactic acidosis Lactic acid 2.7 > 2.1, this is possibly secondary to sepsis Continue to trend lactic acid  Acute metabolic encephalopathy This is possibly secondary to severe sepsis due to UTI Continue management as described above for UTI Continue fall precaution  Low MCV MCV 76.2, iron studies will be checked  Hyperlipidemia Continue statin  Schizoaffective disorder, bipolar type/tardive dyskinesia Continue Zyprexa, Ingrezza, Klonopin, Depakote   DVT prophylaxis: Lovenox  Code Status: Full code  Family Communication: None at bedside  Consults: None  Severity of  Illness: The appropriate patient status for this patient is INPATIENT. Inpatient status is judged to be reasonable and necessary in order to provide the required intensity of service to ensure the patient's safety. The patient's presenting symptoms, physical exam findings, and initial radiographic and laboratory data in the context of their chronic comorbidities is felt to place them at high risk for further clinical deterioration. Furthermore, it is not anticipated that the patient will be medically stable for discharge from the hospital within 2 midnights of admission.   * I certify that at the point of admission it is my clinical judgment that the patient will require inpatient hospital care spanning beyond 2 midnights from the point of admission due to high intensity of service, high risk for further deterioration and high frequency of surveillance required.*  Author: Frankey Shown, DO 03/18/2023 6:07 AM  For on call review [email or web address].

## 2023-03-18 NOTE — [place] FL2 (Signed)
[place] MEDICAID FL2 LEVEL OF CARE FORM     IDENTIFICATION  Patient Name: Karen Best Birthdate: 1955/12/11 Sex: female Admission Date (Current Location): 03/17/2023  [place] and [place] Number:  Aaron Edelman [id number] R Facility and Address:  [facility],  [address] [place] [address]      Provider Number: [id number]  Attending Physician Name and Address:  Cleora Fleet, MD  Relative Name and Phone Number:  Cassell Clement [phone]    Current Level of Care: Hospital Recommended Level of Care: Skilled Nursing Facility Prior Approval Number:    Date Approved/Denied:   PASRR Number:    Discharge Plan: SNF    Current Diagnoses: Patient Active Problem List   Diagnosis Date Noted   Sepsis due to urinary tract infection (HCC) 03/18/2023   Lactic acidosis 03/18/2023   Low mean corpuscular volume (MCV) 03/18/2023   Mixed hyperlipidemia 07/16/2022   Valproic acid toxicity 02/22/2021   Malnutrition of moderate degree 11/10/2020   Hypertension    Acute encephalopathy 09/18/2020   Schizoaffective disorder, bipolar type (HCC) 05/13/2020   Bacteremia due to Staphylococcus aureus 10/03/2019   AKI (acute kidney injury) (HCC) 10/02/2019   Transaminitis 10/02/2019   CVA (cerebral vascular accident) (HCC) 10/02/2019   AMS (altered mental status) 10/01/2019   Lithium toxicity 02/16/2019   Hyperkalemia 02/16/2019   Acute confusion 12/29/2018   Toxic metabolic encephalopathy 12/24/2018   Asthma 12/23/2018   Delirium due to another medical condition    Acute metabolic encephalopathy 12/06/2018   Chronic hepatitis C without hepatic coma (HCC) 06/11/2018    Orientation RESPIRATION BLADDER Height & Weight     Self  Normal External catheter Weight: 156 lb 8.4 oz (71 kg) Height:  5\' 5"  (165.1 cm)  BEHAVIORAL SYMPTOMS/MOOD NEUROLOGICAL BOWEL NUTRITION STATUS      Incontinent Diet (See d/c summary)  AMBULATORY STATUS COMMUNICATION OF NEEDS Skin    Extensive Assist  (not communicating at this time) Skin abrasions                       Personal Care Assistance Level of Assistance  Bathing, Feeding, Dressing Bathing Assistance: Maximum assistance Feeding assistance: Limited assistance Dressing Assistance: Maximum assistance     Functional Limitations Info  Sight, Hearing, Speech Sight Info: Adequate Hearing Info: Adequate Speech Info: Impaired (unable to answer questions at this time)    SPECIAL CARE FACTORS FREQUENCY                       Contractures      Additional Factors Info  Code Status, Allergies, Psychotropic Code Status Info: Full code Allergies Info: Haloperidol and Related Psychotropic Info: Klonopin, Remeron, Depakote, Zyprexa         Current Medications (03/18/2023):  This is the current hospital active medication list Current Facility-Administered Medications  Medication Dose Route Frequency Provider Last Rate Last Admin   acetaminophen (TYLENOL) tablet 650 mg  650 mg Oral Q6H PRN Adefeso, Oladapo, DO       Or   acetaminophen (TYLENOL) suppository 650 mg  650 mg Rectal Q6H PRN Adefeso, Oladapo, DO       cefTRIAXone (ROCEPHIN) 1 g in sodium chloride 0.9 % 100 mL IVPB  1 g Intravenous Q24H Adefeso, Oladapo, DO       clonazePAM (KLONOPIN) tablet 0.5 mg  0.5 mg Oral Daily Adefeso, Oladapo, DO       divalproex (DEPAKOTE) DR tablet 375 mg  375 mg Oral BID  Frankey Shown, DO       enoxaparin (LOVENOX) injection 40 mg  40 mg Subcutaneous Daily Adefeso, Oladapo, DO   40 mg at 03/18/23 0272   lactated ringers infusion   Intravenous Continuous Adefeso, Oladapo, DO 150 mL/hr at 03/18/23 0822 1,000 mL at 03/18/23 0822   OLANZapine (ZYPREXA) tablet 5 mg  5 mg Oral QHS Adefeso, Oladapo, DO       ondansetron (ZOFRAN) tablet 4 mg  4 mg Oral Q6H PRN Adefeso, Oladapo, DO       Or   ondansetron (ZOFRAN) injection 4 mg  4 mg Intravenous Q6H PRN Adefeso, Oladapo, DO       rosuvastatin (CRESTOR) tablet 10 mg   10 mg Oral QHS Adefeso, Oladapo, DO       valbenazine (INGREZZA) capsule 40 mg  40 mg Oral Daily Adefeso, Oladapo, DO         Discharge Medications: Please see discharge summary for a list of discharge medications.  Relevant Imaging Results:  Relevant Lab Results:   Additional Information    Karn Cassis, LCSW

## 2023-03-18 NOTE — Sepsis Progress Note (Addendum)
Elink monitoring for the code sepsis protocol. Blood cx, lactics collected & antibiotics given prior to code sepsis order.

## 2023-03-18 NOTE — Progress Notes (Signed)
ASSUMPTION OF CARE NOTE   03/18/2023 6:08 PM  Karen Best was seen and examined.  The H&P by the admitting provider, orders, imaging was reviewed.  Please see new orders.  Will continue to follow.   Vitals:   03/18/23 1313 03/18/23 1454  BP: 118/63 107/60  Pulse: 97 76  Resp: 17 20  Temp: (!) 101.9 F (38.8 C) (!) 100.7 F (38.2 C)  SpO2: 94% 94%    Results for orders placed or performed during the hospital encounter of 03/17/23  Culture, blood (Routine X 2) w Reflex to ID Panel   Specimen: BLOOD  Result Value Ref Range   Specimen Description BLOOD LEFT ANTECUBITAL    Special Requests      BOTTLES DRAWN AEROBIC AND ANAEROBIC Blood Culture results may not be optimal due to an excessive volume of blood received in culture bottles   Culture      NO GROWTH < 12 HOURS Performed at [facility], [address], [place], [place] [address]    Report Status PENDING   Culture, blood (Routine X 2) w Reflex to ID Panel   Specimen: BLOOD  Result Value Ref Range   Specimen Description      BLOOD RIGHT ANTECUBITAL Performed at [facility], [address] [place], [place] [address]    Special Requests      BOTTLES DRAWN AEROBIC AND ANAEROBIC Blood Culture results may not be optimal due to an excessive volume of blood received in culture bottles Performed at [facility], [address] [place], [place] [address]    Culture  Setup Time      GRAM NEGATIVE RODS ANAEROBIC BOTTLE ONLY Gram Stain Report Called to,Read Back By and Verified With: SURLES M. AT 1333 ON [id number] BY THOMPSON S. GRAM STAIN REVIEWED-AGREE WITH RESULT DRT CRITICAL RESULT CALLED TO, READ BACK BY AND VERIFIED WITH: RN MARN FLORES ON 03/18/23 @ 1759 BY DRT Performed at [facility], [address], [place], [place] [address]    Culture GRAM NEGATIVE RODS    Report Status PENDING   Urine Culture   Specimen: Urine, Clean Catch  Result Value Ref Range   Specimen Description      URINE, CLEAN  CATCH Performed at [facility], [address], [place], [place] [address]    Special Requests      NONE Reflexed from [id number] Performed at [facility], [address] [place], [place] [address]    Culture PENDING    Report Status PENDING   Blood Culture ID Panel (Reflexed)  Result Value Ref Range   Enterococcus faecalis NOT DETECTED NOT DETECTED   Enterococcus Faecium NOT DETECTED NOT DETECTED   Listeria monocytogenes NOT DETECTED NOT DETECTED   Staphylococcus species NOT DETECTED NOT DETECTED   Staphylococcus aureus (BCID) NOT DETECTED NOT DETECTED   Staphylococcus epidermidis NOT DETECTED NOT DETECTED   Staphylococcus lugdunensis NOT DETECTED NOT DETECTED   Streptococcus species NOT DETECTED NOT DETECTED   Streptococcus agalactiae NOT DETECTED NOT DETECTED   Streptococcus pneumoniae NOT DETECTED NOT DETECTED   Streptococcus pyogenes NOT DETECTED NOT DETECTED   A.calcoaceticus-baumannii NOT DETECTED NOT DETECTED   Bacteroides fragilis NOT DETECTED NOT DETECTED   Enterobacterales DETECTED (A) NOT DETECTED   Enterobacter cloacae complex NOT DETECTED NOT DETECTED   Escherichia coli DETECTED (A) NOT DETECTED   Klebsiella aerogenes NOT DETECTED NOT DETECTED   Klebsiella oxytoca NOT DETECTED NOT DETECTED   Klebsiella pneumoniae NOT DETECTED NOT DETECTED   Proteus  species NOT DETECTED NOT DETECTED   Salmonella species NOT DETECTED NOT DETECTED   Serratia marcescens NOT DETECTED NOT DETECTED   Haemophilus influenzae NOT DETECTED NOT DETECTED   Neisseria meningitidis NOT DETECTED NOT DETECTED   Pseudomonas aeruginosa NOT DETECTED NOT DETECTED   Stenotrophomonas maltophilia NOT DETECTED NOT DETECTED   Candida albicans NOT DETECTED NOT DETECTED   Candida auris NOT DETECTED NOT DETECTED   Candida glabrata NOT DETECTED NOT DETECTED   Candida krusei NOT DETECTED NOT DETECTED   Candida parapsilosis NOT DETECTED NOT DETECTED   Candida tropicalis NOT DETECTED NOT DETECTED    Cryptococcus neoformans/gattii NOT DETECTED NOT DETECTED   CTX-M ESBL DETECTED (A) NOT DETECTED   Carbapenem resistance IMP NOT DETECTED NOT DETECTED   Carbapenem resistance KPC NOT DETECTED NOT DETECTED   Carbapenem resistance NDM NOT DETECTED NOT DETECTED   Carbapenem resist OXA 48 LIKE NOT DETECTED NOT DETECTED   Carbapenem resistance VIM NOT DETECTED NOT DETECTED  Basic metabolic panel  Result Value Ref Range   Sodium 133 (L) 135 - 145 mmol/L   Potassium 4.4 3.5 - 5.1 mmol/L   Chloride 100 98 - 111 mmol/L   CO2 23 22 - 32 mmol/L   Glucose, Bld 115 (H) 70 - 99 mg/dL   BUN 19 8 - 23 mg/dL   Creatinine, Ser 0.98 0.44 - 1.00 mg/dL   Calcium 9.7 8.9 - 11.9 mg/dL   GFR, Estimated >14 >78 mL/min   Anion gap 10 5 - 15  CBC with Differential  Result Value Ref Range   WBC 14.3 (H) 4.0 - 10.5 K/uL   RBC 5.54 (H) 3.87 - 5.11 MIL/uL   Hemoglobin 13.1 12.0 - 15.0 g/dL   HCT 29.5 62.1 - 30.8 %   MCV 76.2 (L) 80.0 - 100.0 fL   MCH 23.6 (L) 26.0 - 34.0 pg   MCHC 31.0 30.0 - 36.0 g/dL   RDW 65.7 84.6 - 96.2 %   Platelets 163 150 - 400 K/uL   nRBC 0.0 0.0 - 0.2 %   Neutrophils Relative % 69 %   Neutro Abs 9.9 (H) 1.7 - 7.7 K/uL   Lymphocytes Relative 15 %   Lymphs Abs 2.1 0.7 - 4.0 K/uL   Monocytes Relative 15 %   Monocytes Absolute 2.2 (H) 0.1 - 1.0 K/uL   Eosinophils Relative 0 %   Eosinophils Absolute 0.0 0.0 - 0.5 K/uL   Basophils Relative 0 %   Basophils Absolute 0.0 0.0 - 0.1 K/uL   Immature Granulocytes 1 %   Abs Immature Granulocytes 0.08 (H) 0.00 - 0.07 K/uL  Lactic acid, plasma  Result Value Ref Range   Lactic Acid, Venous 2.7 (HH) 0.5 - 1.9 mmol/L  Lactic acid, plasma  Result Value Ref Range   Lactic Acid, Venous 2.1 (HH) 0.5 - 1.9 mmol/L  Urinalysis, Routine w reflex microscopic -Urine, Catheterized  Result Value Ref Range   Color, Urine AMBER (A) YELLOW   APPearance CLOUDY (A) CLEAR   Specific Gravity, Urine 1.019 1.005 - 1.030   pH 5.0 5.0 - 8.0   Glucose, UA  NEGATIVE NEGATIVE mg/dL   Hgb urine dipstick MODERATE (A) NEGATIVE   Bilirubin Urine NEGATIVE NEGATIVE   Ketones, ur 5 (A) NEGATIVE mg/dL   Protein, ur 952 (A) NEGATIVE mg/dL   Nitrite POSITIVE (A) NEGATIVE   Leukocytes,Ua LARGE (A) NEGATIVE   RBC / HPF 11-20 0 - 5 RBC/hpf   WBC, UA >50 0 - 5 WBC/hpf   Bacteria, UA  MANY (A) NONE SEEN   Squamous Epithelial / HPF 0-5 0 - 5 /HPF   WBC Clumps PRESENT    Mucus PRESENT   Lactic acid, plasma  Result Value Ref Range   Lactic Acid, Venous 2.5 (HH) 0.5 - 1.9 mmol/L  Lactic acid, plasma  Result Value Ref Range   Lactic Acid, Venous 1.7 0.5 - 1.9 mmol/L  CBC  Result Value Ref Range   WBC 11.0 (H) 4.0 - 10.5 K/uL   RBC 5.11 3.87 - 5.11 MIL/uL   Hemoglobin 12.0 12.0 - 15.0 g/dL   HCT 16.1 09.6 - 04.5 %   MCV 76.7 (L) 80.0 - 100.0 fL   MCH 23.5 (L) 26.0 - 34.0 pg   MCHC 30.6 30.0 - 36.0 g/dL   RDW 40.9 81.1 - 91.4 %   Platelets 143 (L) 150 - 400 K/uL   nRBC 0.0 0.0 - 0.2 %  Comprehensive metabolic panel  Result Value Ref Range   Sodium 134 (L) 135 - 145 mmol/L   Potassium 4.3 3.5 - 5.1 mmol/L   Chloride 104 98 - 111 mmol/L   CO2 20 (L) 22 - 32 mmol/L   Glucose, Bld 105 (H) 70 - 99 mg/dL   BUN 17 8 - 23 mg/dL   Creatinine, Ser 7.82 0.44 - 1.00 mg/dL   Calcium 9.1 8.9 - 95.6 mg/dL   Total Protein 7.7 6.5 - 8.1 g/dL   Albumin 2.6 (L) 3.5 - 5.0 g/dL   AST 20 15 - 41 U/L   ALT 15 0 - 44 U/L   Alkaline Phosphatase 43 38 - 126 U/L   Total Bilirubin 0.4 <1.2 mg/dL   GFR, Estimated >21 >30 mL/min   Anion gap 10 5 - 15  Urinalysis, w/ Reflex to Culture (Infection Suspected) -Urine, Clean Catch  Result Value Ref Range   Specimen Source URINE, CLEAN CATCH    Color, Urine YELLOW YELLOW   APPearance CLOUDY (A) CLEAR   Specific Gravity, Urine 1.017 1.005 - 1.030   pH 7.0 5.0 - 8.0   Glucose, UA NEGATIVE NEGATIVE mg/dL   Hgb urine dipstick MODERATE (A) NEGATIVE   Bilirubin Urine NEGATIVE NEGATIVE   Ketones, ur 5 (A) NEGATIVE mg/dL    Protein, ur 865 (A) NEGATIVE mg/dL   Nitrite NEGATIVE NEGATIVE   Leukocytes,Ua LARGE (A) NEGATIVE   RBC / HPF 11-20 0 - 5 RBC/hpf   WBC, UA >50 0 - 5 WBC/hpf   Bacteria, UA NONE SEEN NONE SEEN   Squamous Epithelial / HPF 0-5 0 - 5 /HPF   Mucus PRESENT    Non Squamous Epithelial 0-5 (A) NONE SEEN  Iron and TIBC  Result Value Ref Range   Iron 16 (L) 28 - 170 ug/dL   TIBC 784 (L) 696 - 295 ug/dL   Saturation Ratios 7 (L) 10.4 - 31.8 %   UIBC 225 ug/dL  Ferritin  Result Value Ref Range   Ferritin 102 11 - 307 ng/mL  Magnesium  Result Value Ref Range   Magnesium 2.0 1.7 - 2.4 mg/dL  Phosphorus  Result Value Ref Range   Phosphorus 2.4 (L) 2.5 - 4.6 mg/dL  HIV Antibody (routine testing w rflx)  Result Value Ref Range   HIV Screen 4th Generation wRfx Non Reactive Non Reactive     Maryln Manuel, MD [facility]   03/17/2023  8:20 PM How to contact the [facility] Attending or Consulting provider 7A - 7P or covering provider during after hours 7P -7A, for this  patient?  Check the care team in [facility] and look for a) attending/consulting [facility] provider listed and b) the [facility] team listed Log into [email or web address] and use [place]'s universal password to access. If you do not have the password, please contact the hospital operator. Locate the [facility] provider you are looking for under [facility] and page to a number that you can be directly reached. If you still have difficulty reaching the provider, please page the [facility] (Director on Call) for the Hospitalists listed on amion for assistance.

## 2023-03-18 NOTE — ED Provider Notes (Signed)
  Provider Note MRN:  [id number]  Arrival date & time: 03/18/23    ED Course and Medical Decision Making  Assumed care from Dr. Elayne Snare at shift change.  Urosepsis plan for admission.  Procedures  Final Clinical Impressions(s) / ED Diagnoses     ICD-10-CM   1. Altered mental status, unspecified altered mental status type  R41.82     2. Urinary tract infection without hematuria, site unspecified  N39.0       ED Discharge Orders     None       Discharge Instructions   None     Elmer Sow. Pilar Plate, MD [facility] Emergency Medicine [facility] [email or web address]    Sabas Sous, MD 03/18/23 0700

## 2023-03-19 DIAGNOSIS — G9341 Metabolic encephalopathy: Secondary | ICD-10-CM | POA: Diagnosis not present

## 2023-03-19 DIAGNOSIS — R718 Other abnormality of red blood cells: Secondary | ICD-10-CM | POA: Diagnosis not present

## 2023-03-19 DIAGNOSIS — E782 Mixed hyperlipidemia: Secondary | ICD-10-CM

## 2023-03-19 DIAGNOSIS — E872 Acidosis, unspecified: Secondary | ICD-10-CM | POA: Diagnosis not present

## 2023-03-19 DIAGNOSIS — A419 Sepsis, unspecified organism: Secondary | ICD-10-CM | POA: Diagnosis not present

## 2023-03-19 LAB — CBC WITH DIFFERENTIAL/PLATELET
Abs Immature Granulocytes: 0 10*3/uL (ref 0.00–0.07)
Basophils Absolute: 0 10*3/uL (ref 0.0–0.1)
Basophils Relative: 0 %
Eosinophils Absolute: 0 10*3/uL (ref 0.0–0.5)
Eosinophils Relative: 0 %
HCT: 35.6 % — ABNORMAL LOW (ref 36.0–46.0)
Hemoglobin: 11 g/dL — ABNORMAL LOW (ref 12.0–15.0)
Lymphocytes Relative: 11 %
Lymphs Abs: 1.4 10*3/uL (ref 0.7–4.0)
MCH: 23.7 pg — ABNORMAL LOW (ref 26.0–34.0)
MCHC: 30.9 g/dL (ref 30.0–36.0)
MCV: 76.6 fL — ABNORMAL LOW (ref 80.0–100.0)
Monocytes Absolute: 2.7 10*3/uL — ABNORMAL HIGH (ref 0.1–1.0)
Monocytes Relative: 21 %
Neutro Abs: 8.6 10*3/uL — ABNORMAL HIGH (ref 1.7–7.7)
Neutrophils Relative %: 68 %
Platelets: 130 10*3/uL — ABNORMAL LOW (ref 150–400)
RBC: 4.65 MIL/uL (ref 3.87–5.11)
RDW: 15.2 % (ref 11.5–15.5)
WBC: 12.7 10*3/uL — ABNORMAL HIGH (ref 4.0–10.5)
nRBC: 0 % (ref 0.0–0.2)

## 2023-03-19 LAB — BASIC METABOLIC PANEL
Anion gap: 8 (ref 5–15)
BUN: 18 mg/dL (ref 8–23)
CO2: 24 mmol/L (ref 22–32)
Calcium: 9.3 mg/dL (ref 8.9–10.3)
Chloride: 105 mmol/L (ref 98–111)
Creatinine, Ser: 0.57 mg/dL (ref 0.44–1.00)
GFR, Estimated: >60 mL/min (ref >60)
Glucose, Bld: 88 mg/dL (ref 70–99)
Potassium: 4.2 mmol/L (ref 3.5–5.1)
Sodium: 137 mmol/L (ref 135–145)

## 2023-03-19 MED ORDER — PNEUMOCOCCAL 20-VAL CONJ VACC 0.5 ML IM SUSY
0.5000 mL | PREFILLED_SYRINGE | INTRAMUSCULAR | Status: AC
  Administered 2023-03-20: 0.5 mL via INTRAMUSCULAR
  Filled 2023-03-19: qty 0.5

## 2023-03-19 MED ORDER — ORAL CARE MOUTH RINSE: 15.0000 mL | OROMUCOSAL | Status: DC | PRN

## 2023-03-19 MED ORDER — ORAL CARE MOUTH RINSE
15.0000 mL | OROMUCOSAL | Status: DC
  Administered 2023-03-19 – 2023-03-25 (×23): 15 mL via OROMUCOSAL

## 2023-03-19 NOTE — Progress Notes (Signed)
   03/19/23 1700  Hand-off documentation  Hand-off Given Given to shift RN/LPN  Report given to (Full Name) Donney Rankins RN   Care turned over for remainder of shift.

## 2023-03-19 NOTE — Hospital Course (Addendum)
67 y.o. female with medical history significant of hyperlipidemia, constipation, schizoaffective disorder history of stroke who presents to the emergency department via EMS from SNF due to altered mental status.  Patient was unable to provide history due to altered mental status, history was obtained from ED physician and ED medical record.  Per report, she was noted to be less interactive yesterday, she was usually very conversational at baseline.  Patient was recently diagnosed to have UTI and received a first dose of ceftriaxone yesterday.  EMS was activated and patient was taken to the ED for further evaluation and management.  At bedside, patient was able to state her name Tuckerman), but was unable to provide further history.   In the emergency department, she was febrile with a temperature of 103F, but other vital signs were within normal range.  Workup in the ED showed leukocytosis and MCV of 76.2, BMP was normal except for sodium of 133 and blood glucose of 115.  Lactic acid 2.7 > 2.1, urinalysis was positive for UTI.  Blood culture collected.  Chest x-ray showed no acute abnormality.  Patient was treated with IV ceftriaxone, IV hydration was provided, Tylenol was given due to fever.  Hospitalist was asked to admit patient for further evaluation and management. She was initially started on ceftriaxone.  Once multiplex PCR from blood cultures returned suggesting ESBL Ecoli, patient was switched to meropenem.  She finished 7 days IV merrem and remain afebrile and hemodynamically stable.

## 2023-03-19 NOTE — Progress Notes (Signed)
PROGRESS NOTE   Karen Best  ZOX:[id number] DOB: 1956-03-07 DOA: 03/17/2023 PCP: [facility]   Chief Complaint  Patient presents with   Altered Mental Status   Level of care: Med-Surg  Brief Admission History:  67 y.o. female with medical history significant of hyperlipidemia, constipation, schizoaffective disorder history of stroke who presents to the emergency department via EMS from SNF due to altered mental status.  Patient was unable to provide history due to altered mental status, history was obtained from ED physician and ED medical record.  Per report, she was noted to be less interactive yesterday, she was usually very conversational at baseline.  Patient was recently diagnosed to have UTI and received a first dose of ceftriaxone yesterday.  EMS was activated and patient was taken to the ED for further evaluation and management.  At bedside, patient was able to state her name Garofolo), but was unable to provide further history.   In the emergency department, she was febrile with a temperature of 103F, but other vital signs were within normal range.  Workup in the ED showed leukocytosis and MCV of 76.2, BMP was normal except for sodium of 133 and blood glucose of 115.  Lactic acid 2.7 > 2.1, urinalysis was positive for UTI.  Blood culture collected.  Chest x-ray showed no acute abnormality.  Patient was treated with IV ceftriaxone, IV hydration was provided, Tylenol was given due to fever.  Hospitalist was asked to admit patient for further evaluation and management.   Assessment and Plan:  Severe sepsis due to ESBL UTI Patient met sepsis criteria due to being febrile and having leukocytosis (met SIRS criteria), source of infection was UTI, thereby meeting sepsis criteria.  Lactic acid was greater than 2.0, though admitting severe sepsis criteria. Patient was started on IV ceftriaxone and subsequently changed to meropenem when ESBL results were  available  Continue Tylenol as needed for fever Blood cultures positive for ESBL E coli   ESBL E coli Bacteremia  - continue IV meropenem  - plan antibiotics for at least 7 days   Lactic acidosis - RESOLVED  Lactic acid 2.7 > 2.1, secondary to sepsis   Acute metabolic encephalopathy Secondary to severe sepsis due to UTI Continue management as described above for UTI Continue fall precaution   Low MCV MCV 76.2, iron studies will be followed   Hyperlipidemia Continue statin   Schizoaffective disorder, bipolar type/tardive dyskinesia Continue Zyprexa, Ingrezza, Klonopin, Depakote    DVT prophylaxis: enoxaparin Code Status: Full  Family Communication:  Disposition: anticipate return to LTC when medically stable    Consultants:   Procedures:   Antimicrobials:  Ceftriaxone 11/20 Meropenem 11/20>>   Subjective: Pt responded that she was not hungry.   Objective: Vitals:   03/18/23 2330 03/19/23 0338 03/19/23 0820 03/19/23 1225  BP: 133/71 113/71 104/66 91/61  Pulse: 66 64 60 (!) 55  Resp: 18 16 17 16   Temp: 97.8 F (36.6 C) (!) 100.9 F (38.3 C) 99.3 F (37.4 C) 99 F (37.2 C)  TempSrc: Axillary Oral Axillary Axillary  SpO2:  100% 94% 96%  Weight:      Height:        Intake/Output Summary (Last 24 hours) at 03/19/2023 1648 Last data filed at 03/19/2023 1311 Gross per 24 hour  Intake 621.78 ml  Output 200 ml  Net 421.78 ml   Filed Weights   03/17/23 2022  Weight: 71 kg   Examination:  General exam: chronically ill and  debilitated Appears calm and comfortable  Respiratory system: Clear to auscultation. Respiratory effort normal. Cardiovascular system: normal S1 & S2 heard. No JVD, murmurs, rubs, gallops or clicks. No pedal edema. Gastrointestinal system: Abdomen is nondistended, soft and nontender. No organomegaly or masses felt. Normal bowel sounds heard. Central nervous system: Alert and oriented to person. No focal neurological  deficits. Extremities: Symmetric 5 x 5 power. Skin: No rashes, lesions or ulcers. Psychiatry: Judgement and insight appear severely diminished. Mood & affect flat.   Data Reviewed: I have personally reviewed following labs and imaging studies  CBC: Recent Labs  Lab 03/17/23 2047 03/18/23 0615 03/19/23 0417  WBC 14.3* 11.0* 12.7*  NEUTROABS 9.9*  --  8.6*  HGB 13.1 12.0 11.0*  HCT 42.2 39.2 35.6*  MCV 76.2* 76.7* 76.6*  PLT 163 143* 130*    Basic Metabolic Panel: Recent Labs  Lab 03/17/23 2047 03/18/23 0615 03/19/23 0417  NA 133* 134* 137  K 4.4 4.3 4.2  CL 100 104 105  CO2 23 20* 24  GLUCOSE 115* 105* 88  BUN 19 17 18   CREATININE 0.81 0.73 0.57  CALCIUM 9.7 9.1 9.3  MG  --  2.0  --   PHOS  --  2.4*  --     CBG: No results for input(s): "GLUCAP" in the last 168 hours.  Recent Results (from the past 240 hour(s))  Culture, blood (Routine X 2) w Reflex to ID Panel     Status: Abnormal (Preliminary result)   Collection Time: 03/17/23  8:47 PM   Specimen: BLOOD  Result Value Ref Range Status   Specimen Description   Final    BLOOD RIGHT ANTECUBITAL Performed at [facility], [address], [place], [place] [address]    Special Requests   Final    BOTTLES DRAWN AEROBIC AND ANAEROBIC Blood Culture results may not be optimal due to an excessive volume of blood received in culture bottles Performed at [facility], [address] [place], [place] [address]    Culture  Setup Time   Final    GRAM NEGATIVE RODS ANAEROBIC BOTTLE ONLY Gram Stain Report Called to,Read Back By and Verified With: SURLES M. AT 1333 ON [id number] BY THOMPSON S. GRAM STAIN REVIEWED-AGREE WITH RESULT DRT CRITICAL RESULT CALLED TO, READ BACK BY AND VERIFIED WITH: RN MARN FLORES ON 03/18/23 @ 1759 BY DRT    Culture (A)  Final    ESCHERICHIA COLI SUSCEPTIBILITIES TO FOLLOW Performed at [facility], [address] [place], [place] [address]    Report Status PENDING  Incomplete  Blood  Culture ID Panel (Reflexed)     Status: Abnormal   Collection Time: 03/17/23  8:47 PM  Result Value Ref Range Status   Enterococcus faecalis NOT DETECTED NOT DETECTED Final   Enterococcus Faecium NOT DETECTED NOT DETECTED Final   Listeria monocytogenes NOT DETECTED NOT DETECTED Final   Staphylococcus species NOT DETECTED NOT DETECTED Final   Staphylococcus aureus (BCID) NOT DETECTED NOT DETECTED Final   Staphylococcus epidermidis NOT DETECTED NOT DETECTED Final   Staphylococcus lugdunensis NOT DETECTED NOT DETECTED Final   Streptococcus species NOT DETECTED NOT DETECTED Final   Streptococcus agalactiae NOT DETECTED NOT DETECTED Final   Streptococcus pneumoniae NOT DETECTED NOT DETECTED Final   Streptococcus pyogenes NOT DETECTED NOT DETECTED Final   A.calcoaceticus-baumannii NOT DETECTED NOT DETECTED Final   Bacteroides fragilis NOT DETECTED NOT DETECTED Final   Enterobacterales DETECTED (A) NOT DETECTED Final    Comment: Enterobacterales represent  a large order of gram negative bacteria, not a single organism. CRITICAL RESULT CALLED TO, READ BACK BY AND VERIFIED WITH: RN MARN FLORES ON 03/18/23 @ 1759 BY DRT    Enterobacter cloacae complex NOT DETECTED NOT DETECTED Final   Escherichia coli DETECTED (A) NOT DETECTED Final    Comment: CRITICAL RESULT CALLED TO, READ BACK BY AND VERIFIED WITH: RN MARN FLORES ON 03/18/23 @ 1759 BY DRT    Klebsiella aerogenes NOT DETECTED NOT DETECTED Final   Klebsiella oxytoca NOT DETECTED NOT DETECTED Final   Klebsiella pneumoniae NOT DETECTED NOT DETECTED Final   Proteus species NOT DETECTED NOT DETECTED Final   Salmonella species NOT DETECTED NOT DETECTED Final   Serratia marcescens NOT DETECTED NOT DETECTED Final   Haemophilus influenzae NOT DETECTED NOT DETECTED Final   Neisseria meningitidis NOT DETECTED NOT DETECTED Final   Pseudomonas aeruginosa NOT DETECTED NOT DETECTED Final   Stenotrophomonas maltophilia NOT DETECTED NOT DETECTED Final    Candida albicans NOT DETECTED NOT DETECTED Final   Candida auris NOT DETECTED NOT DETECTED Final   Candida glabrata NOT DETECTED NOT DETECTED Final   Candida krusei NOT DETECTED NOT DETECTED Final   Candida parapsilosis NOT DETECTED NOT DETECTED Final   Candida tropicalis NOT DETECTED NOT DETECTED Final   Cryptococcus neoformans/gattii NOT DETECTED NOT DETECTED Final   CTX-M ESBL DETECTED (A) NOT DETECTED Final    Comment: CRITICAL RESULT CALLED TO, READ BACK BY AND VERIFIED WITH: RN MARN FLORES ON 03/18/23 @ 1759 BY DRT (NOTE) Extended spectrum beta-lactamase detected. Recommend a carbapenem as initial therapy.      Carbapenem resistance IMP NOT DETECTED NOT DETECTED Final   Carbapenem resistance KPC NOT DETECTED NOT DETECTED Final   Carbapenem resistance NDM NOT DETECTED NOT DETECTED Final   Carbapenem resist OXA 48 LIKE NOT DETECTED NOT DETECTED Final   Carbapenem resistance VIM NOT DETECTED NOT DETECTED Final    Comment: Performed at [facility], [address] [place], [place] [address]  Culture, blood (Routine X 2) w Reflex to ID Panel     Status: None (Preliminary result)   Collection Time: 03/17/23 10:26 PM   Specimen: BLOOD  Result Value Ref Range Status   Specimen Description BLOOD LEFT ANTECUBITAL  Final   Special Requests   Final    BOTTLES DRAWN AEROBIC AND ANAEROBIC Blood Culture results may not be optimal due to an excessive volume of blood received in culture bottles   Culture   Final    NO GROWTH < 12 HOURS Performed at [facility], [address], [place], [place] [address]    Report Status PENDING  Incomplete  Urine Culture     Status: Abnormal (Preliminary result)   Collection Time: 03/18/23  8:30 AM   Specimen: Urine, Clean Catch  Result Value Ref Range Status   Specimen Description   Final    URINE, CLEAN CATCH Performed at [facility], [address] [place], [place] [address]    Special Requests   Final    NONE Reflexed from  [id number] Performed at [facility], [address], [place], [place] [address]    Culture (A)  Final    30,000 COLONIES/mL ESCHERICHIA COLI SUSCEPTIBILITIES TO FOLLOW Performed at [facility], [address] [place] [address] [place], [place] [address]    Report Status PENDING  Incomplete     Radiology Studies: DG Chest Portable 1 View  Result Date: 03/18/2023 CLINICAL DATA:  Possible pneumonia EXAM: PORTABLE CHEST 1  VIEW COMPARISON:  07/16/2022 FINDINGS: Cardiac shadow is within normal limits. The lungs are well aerated bilaterally. No focal infiltrate or effusion is seen. No bony abnormality is noted. IMPRESSION: No acute abnormality noted. Electronically Signed   By: Alcide Clever M.D.   On: 03/18/2023 00:03    Scheduled Meds:  clonazePAM  0.5 mg Oral Daily   divalproex  375 mg Oral BID   enoxaparin (LOVENOX) injection  40 mg Subcutaneous Daily   OLANZapine  5 mg Oral QHS   mouth rinse  15 mL Mouth Rinse 4 times per day   [START ON 03/20/2023] pneumococcal 20-valent conjugate vaccine  0.5 mL Intramuscular Tomorrow-1000   rosuvastatin  10 mg Oral QHS   valbenazine  40 mg Oral Daily   Continuous Infusions:  meropenem (MERREM) IV 200 mL/hr at 03/19/23 1311     LOS: 1 day   Time spent: 47 mins  Addi Pak Laural Benes, MD How to contact the [facility] Attending or Consulting provider 7A - 7P or covering provider during after hours 7P -7A, for this patient?  Check the care team in [facility] and look for a) attending/consulting TR[facility] provider listed and b) the [facility] team listed Log into [email or web address] to find provider on call.  Locate the [facility] provider you are looking for under [facility] and page to a number that you can be directly reached. If you still have difficulty reaching the provider, please page the [facility] (Director on Call) for the Hospitalists listed on amion for assistance.  03/19/2023, 4:48 PM

## 2023-03-19 NOTE — Plan of Care (Signed)
Pt has yet to have a bm since being admitted. Contacted on call DOC Zierle-Ghosh to inform her of situation. Pt continues to have urine output without incident. Pt also had a fever of 100.9, gave PRN Tyl 650 mg. Pt has altered mental status, could not verbalize if she was experiencing any pain or discomfort. Pt is currently resting, rise and fall of chest visualized. Will continue to monitor pt for any noticeable changes.

## 2023-03-19 NOTE — Plan of Care (Signed)
Not interactive. No discernable words. Grunts and moans.  Purewick in place, incontinent of bowel and bladder.   Problem: Clinical Measurements: Goal: Ability to maintain clinical measurements within normal limits will improve Outcome: Progressing Goal: Will remain free from infection Outcome: Progressing Goal: Diagnostic test results will improve Outcome: Progressing   Problem: Elimination: Goal: Will not experience complications related to urinary retention Outcome: Progressing   Problem: Pain Management: Goal: General experience of comfort will improve Outcome: Progressing

## 2023-03-20 DIAGNOSIS — G9341 Metabolic encephalopathy: Secondary | ICD-10-CM | POA: Diagnosis not present

## 2023-03-20 DIAGNOSIS — N390 Urinary tract infection, site not specified: Secondary | ICD-10-CM | POA: Diagnosis not present

## 2023-03-20 DIAGNOSIS — F250 Schizoaffective disorder, bipolar type: Secondary | ICD-10-CM | POA: Diagnosis not present

## 2023-03-20 DIAGNOSIS — A419 Sepsis, unspecified organism: Secondary | ICD-10-CM | POA: Diagnosis not present

## 2023-03-20 LAB — CBC WITH DIFFERENTIAL/PLATELET
Abs Immature Granulocytes: 0.19 10*3/uL — ABNORMAL HIGH (ref 0.00–0.07)
Basophils Absolute: 0.1 10*3/uL (ref 0.0–0.1)
Basophils Relative: 1 %
Eosinophils Absolute: 0 10*3/uL (ref 0.0–0.5)
Eosinophils Relative: 1 %
HCT: 38 % (ref 36.0–46.0)
Hemoglobin: 11.7 g/dL — ABNORMAL LOW (ref 12.0–15.0)
Immature Granulocytes: 2 %
Lymphocytes Relative: 26 %
Lymphs Abs: 2.2 10*3/uL (ref 0.7–4.0)
MCH: 23.5 pg — ABNORMAL LOW (ref 26.0–34.0)
MCHC: 30.8 g/dL (ref 30.0–36.0)
MCV: 76.5 fL — ABNORMAL LOW (ref 80.0–100.0)
Monocytes Absolute: 1.6 10*3/uL — ABNORMAL HIGH (ref 0.1–1.0)
Monocytes Relative: 20 %
Neutro Abs: 4.1 10*3/uL (ref 1.7–7.7)
Neutrophils Relative %: 50 %
Platelets: 158 10*3/uL (ref 150–400)
RBC: 4.97 MIL/uL (ref 3.87–5.11)
RDW: 15.4 % (ref 11.5–15.5)
WBC: 8.2 10*3/uL (ref 4.0–10.5)
nRBC: 0 % (ref 0.0–0.2)

## 2023-03-20 LAB — BASIC METABOLIC PANEL
Anion gap: 8 (ref 5–15)
BUN: 22 mg/dL (ref 8–23)
CO2: 22 mmol/L (ref 22–32)
Calcium: 9.2 mg/dL (ref 8.9–10.3)
Chloride: 107 mmol/L (ref 98–111)
Creatinine, Ser: 0.54 mg/dL (ref 0.44–1.00)
GFR, Estimated: >60 mL/min (ref >60)
Glucose, Bld: 98 mg/dL (ref 70–99)
Potassium: 3.6 mmol/L (ref 3.5–5.1)
Sodium: 137 mmol/L (ref 135–145)

## 2023-03-20 LAB — URINE CULTURE: Culture: 30000 — AB

## 2023-03-20 LAB — CULTURE, BLOOD (ROUTINE X 2)

## 2023-03-20 MED ORDER — FERROUS SULFATE 325 (65 FE) MG PO TABS
325.0000 mg | ORAL_TABLET | Freq: Every day | ORAL | Status: DC
  Administered 2023-03-21 – 2023-03-25 (×5): 325 mg via ORAL
  Filled 2023-03-20 (×5): qty 1

## 2023-03-20 NOTE — TOC Progression Note (Signed)
Transition of Care [facility]) - Progression Note    Patient Details  Name: Karen Best MRN: [id number] Date of Birth: [birth year]  Transition of [facility]) CM/SW Contact  Elliot Gault, LCSW Phone Number: 03/20/2023, 9:47 AM  Clinical Narrative:     TOC following. Per MD, pt may be stable for dc back to [facility] over the weekend. Updated Logan at [facility] and she states they can accept pt back over the weekend.  RN will need to call the main number for report. Weekend TOC will need to fax dc clinical as there will not be anyone in the building who can access Epic.  TOC will follow.  Expected Discharge Plan: Long Term Nursing Home Barriers to Discharge: Continued Medical Work up  Expected Discharge Plan and Services In-house Referral: Clinical Social Work   Post Acute Care Choice: Nursing Home Living arrangements for the past 2 months: Skilled Nursing Facility                                       Social Determinants of Health (SDOH) Interventions SDOH Screenings   Food Insecurity: Patient Unable To Answer (03/19/2023)  Housing: Patient Unable To Answer (03/19/2023)  Transportation Needs: Patient Unable To Answer (03/19/2023)  Utilities: Patient Unable To Answer (03/19/2023)  Depression (PHQ2-9): Low Risk  (02/06/2022)  Tobacco Use: High Risk (03/17/2023)    Readmission Risk Interventions    02/25/2021    1:11 PM 09/25/2020    9:54 AM  Readmission Risk Prevention Plan  Transportation Screening Complete Complete  PCP or Specialist Appt within 5-7 Days  Complete  PCP or Specialist Appt within 3-5 Days Not Complete   Not Complete comments Three attempts made to call [facility]--phone rings and rings but no answer, cannot leave mesage.   Home Care Screening  Complete  Medication Review (RN CM)  Complete  HRI or Home Care Consult Complete   Social Work Consult for Recovery Care Planning/Counseling Complete    Palliative Care Screening Not Applicable

## 2023-03-20 NOTE — Progress Notes (Signed)
PROGRESS NOTE   Karen Best  ZOX:[id number] DOB: 19-Jan-1956 DOA: 03/17/2023 PCP: [facility]   Chief Complaint  Patient presents with   Altered Mental Status   Level of care: Med-Surg  Brief Admission History:  67 y.o. female with medical history significant of hyperlipidemia, constipation, schizoaffective disorder history of stroke who presents to the emergency department via EMS from SNF due to altered mental status.  Patient was unable to provide history due to altered mental status, history was obtained from ED physician and ED medical record.  Per report, she was noted to be less interactive yesterday, she was usually very conversational at baseline.  Patient was recently diagnosed to have UTI and received a first dose of ceftriaxone yesterday.  EMS was activated and patient was taken to the ED for further evaluation and management.  At bedside, patient was able to state her name Herbst), but was unable to provide further history.   In the emergency department, she was febrile with a temperature of 103F, but other vital signs were within normal range.  Workup in the ED showed leukocytosis and MCV of 76.2, BMP was normal except for sodium of 133 and blood glucose of 115.  Lactic acid 2.7 > 2.1, urinalysis was positive for UTI.  Blood culture collected.  Chest x-ray showed no acute abnormality.  Patient was treated with IV ceftriaxone, IV hydration was provided, Tylenol was given due to fever.  Hospitalist was asked to admit patient for further evaluation and management.   Assessment and Plan:  Severe sepsis due to ESBL UTI Patient met sepsis criteria due to being febrile and having leukocytosis (met SIRS criteria), source of infection was UTI, thereby meeting sepsis criteria.  Lactic acid was greater than 2.0, though admitting severe sepsis criteria. Patient was started on IV ceftriaxone and subsequently changed to meropenem when ESBL results were  available  Continue Tylenol as needed for fever Blood cultures positive for ESBL E coli  Sepsis physiology resolving   ESBL E coli Bacteremia  - continue IV meropenem  - plan antibiotics for at least 7 days - awaiting sensitivity data    Lactic acidosis - RESOLVED  Lactic acid 2.7 > 2.1, secondary to sepsis   Acute metabolic encephalopathy - RESOLVED  Secondary to severe sepsis due to UTI Continue management as described above for UTI Continue fall precaution Pt seems to be getting back to baseline and more alert and communicative   Low MCV - iron deficiency anemia MCV 76.2, iron studies ordered -serum iron 16 -TIBC 241 -daily iron supplement ordered  Hyperlipidemia Continue statin   Schizoaffective disorder, bipolar type/tardive dyskinesia Continue Zyprexa, Ingrezza, Klonopin, Depakote    DVT prophylaxis: enoxaparin Code Status: Full  Family Communication:  Disposition: anticipate return to LTC when medically stable    Consultants:   Procedures:   Antimicrobials:  Ceftriaxone 11/20 Meropenem 11/20>>   Subjective: Pt more awake and alert and talking today, no specific complaints. She is eating and drinking well with RN.    Objective: Vitals:   03/19/23 0820 03/19/23 1225 03/19/23 1920 03/20/23 0605  BP: 104/66 91/61 106/83 (!) 129/56  Pulse: 60 (!) 55 64 65  Resp: 17 16 20 16   Temp: 99.3 F (37.4 C) 99 F (37.2 C) 99.6 F (37.6 C) 97.7 F (36.5 C)  TempSrc: Axillary Axillary    SpO2: 94% 96% 95% 100%  Weight:      Height:        Intake/Output Summary (Last  24 hours) at 03/20/2023 0948 Last data filed at 03/20/2023 0800 Gross per 24 hour  Intake 1132.94 ml  Output 400 ml  Net 732.94 ml   Filed Weights   03/17/23 2022  Weight: 71 kg   Examination:  General exam: chronically ill and debilitated Appears calm and comfortable  Respiratory system: Clear to auscultation. Respiratory effort normal. Cardiovascular system: normal S1 & S2 heard. No  JVD, murmurs, rubs, gallops or clicks. No pedal edema. Gastrointestinal system: Abdomen is nondistended, soft and nontender. No organomegaly or masses felt. Normal bowel sounds heard. Central nervous system: Alert and oriented to person. No focal neurological deficits. Extremities: Symmetric 5 x 5 power. Skin: No rashes, lesions or ulcers. Psychiatry: Judgement and insight appear diminished. Mood & affect flat.   Data Reviewed: I have personally reviewed following labs and imaging studies  CBC: Recent Labs  Lab 03/17/23 2047 03/18/23 0615 03/19/23 0417 03/20/23 0422  WBC 14.3* 11.0* 12.7* 8.2  NEUTROABS 9.9*  --  8.6* 4.1  HGB 13.1 12.0 11.0* 11.7*  HCT 42.2 39.2 35.6* 38.0  MCV 76.2* 76.7* 76.6* 76.5*  PLT 163 143* 130* 158    Basic Metabolic Panel: Recent Labs  Lab 03/17/23 2047 03/18/23 0615 03/19/23 0417 03/20/23 0422  NA 133* 134* 137 137  K 4.4 4.3 4.2 3.6  CL 100 104 105 107  CO2 23 20* 24 22  GLUCOSE 115* 105* 88 98  BUN 19 17 18 22   CREATININE 0.81 0.73 0.57 0.54  CALCIUM 9.7 9.1 9.3 9.2  MG  --  2.0  --   --   PHOS  --  2.4*  --   --     CBG: No results for input(s): "GLUCAP" in the last 168 hours.  Recent Results (from the past 240 hour(s))  Culture, blood (Routine X 2) w Reflex to ID Panel     Status: Abnormal   Collection Time: 03/17/23  8:47 PM   Specimen: BLOOD  Result Value Ref Range Status   Specimen Description   Final    BLOOD RIGHT ANTECUBITAL Performed at [facility], [address], [place], [place] [address]    Special Requests   Final    BOTTLES DRAWN AEROBIC AND ANAEROBIC Blood Culture results may not be optimal due to an excessive volume of blood received in culture bottles Performed at [facility], [address] [place], [place] [address]    Culture  Setup Time   Final    GRAM NEGATIVE RODS ANAEROBIC BOTTLE ONLY Gram Stain Report Called to,Read Back By and Verified With: SURLES M. AT 1333 ON [id number] BY THOMPSON S. GRAM STAIN  REVIEWED-AGREE WITH RESULT DRT CRITICAL RESULT CALLED TO, READ BACK BY AND VERIFIED WITH: RN MARN FLORES ON 03/18/23 @ 1759 BY DRT AEROBIC BOTTLE ONLY GRAM NEGATIVE RODS GRAM STAIN REVIEWED-AGREE WITH RESULT FH Performed at [facility], [address] [place], [place] [address]    Culture (A)  Final    ESCHERICHIA COLI Confirmed Extended Spectrum Beta-Lactamase Producer (ESBL).  In bloodstream infections from ESBL organisms, carbapenems are preferred over piperacillin/tazobactam. They are shown to have a lower risk of mortality.    Report Status 03/20/2023 FINAL  Final   Organism ID, Bacteria ESCHERICHIA COLI  Final   Organism ID, Bacteria ESCHERICHIA COLI  Final      Susceptibility   Escherichia coli - KIRBY BAUER*    CEFAZOLIN RESISTANT Resistant    Escherichia coli - MIC*    AMPICILLIN >=32 RESISTANT  Resistant     CEFEPIME >=32 RESISTANT Resistant     CEFTAZIDIME >=64 RESISTANT Resistant     CEFTRIAXONE >=64 RESISTANT Resistant     CIPROFLOXACIN >=4 RESISTANT Resistant     GENTAMICIN <=1 SENSITIVE Sensitive     IMIPENEM <=0.25 SENSITIVE Sensitive     TRIMETH/SULFA >=320 RESISTANT Resistant     AMPICILLIN/SULBACTAM >=32 RESISTANT Resistant     PIP/TAZO 16 SENSITIVE Sensitive ug/mL    * ESCHERICHIA COLI    ESCHERICHIA COLI  Blood Culture ID Panel (Reflexed)     Status: Abnormal   Collection Time: 03/17/23  8:47 PM  Result Value Ref Range Status   Enterococcus faecalis NOT DETECTED NOT DETECTED Final   Enterococcus Faecium NOT DETECTED NOT DETECTED Final   Listeria monocytogenes NOT DETECTED NOT DETECTED Final   Staphylococcus species NOT DETECTED NOT DETECTED Final   Staphylococcus aureus (BCID) NOT DETECTED NOT DETECTED Final   Staphylococcus epidermidis NOT DETECTED NOT DETECTED Final   Staphylococcus lugdunensis NOT DETECTED NOT DETECTED Final   Streptococcus species NOT DETECTED NOT DETECTED Final   Streptococcus agalactiae NOT DETECTED NOT DETECTED Final    Streptococcus pneumoniae NOT DETECTED NOT DETECTED Final   Streptococcus pyogenes NOT DETECTED NOT DETECTED Final   A.calcoaceticus-baumannii NOT DETECTED NOT DETECTED Final   Bacteroides fragilis NOT DETECTED NOT DETECTED Final   Enterobacterales DETECTED (A) NOT DETECTED Final    Comment: Enterobacterales represent a large order of gram negative bacteria, not a single organism. CRITICAL RESULT CALLED TO, READ BACK BY AND VERIFIED WITH: RN MARN FLORES ON 03/18/23 @ 1759 BY DRT    Enterobacter cloacae complex NOT DETECTED NOT DETECTED Final   Escherichia coli DETECTED (A) NOT DETECTED Final    Comment: CRITICAL RESULT CALLED TO, READ BACK BY AND VERIFIED WITH: RN MARN FLORES ON 03/18/23 @ 1759 BY DRT    Klebsiella aerogenes NOT DETECTED NOT DETECTED Final   Klebsiella oxytoca NOT DETECTED NOT DETECTED Final   Klebsiella pneumoniae NOT DETECTED NOT DETECTED Final   Proteus species NOT DETECTED NOT DETECTED Final   Salmonella species NOT DETECTED NOT DETECTED Final   Serratia marcescens NOT DETECTED NOT DETECTED Final   Haemophilus influenzae NOT DETECTED NOT DETECTED Final   Neisseria meningitidis NOT DETECTED NOT DETECTED Final   Pseudomonas aeruginosa NOT DETECTED NOT DETECTED Final   Stenotrophomonas maltophilia NOT DETECTED NOT DETECTED Final   Candida albicans NOT DETECTED NOT DETECTED Final   Candida auris NOT DETECTED NOT DETECTED Final   Candida glabrata NOT DETECTED NOT DETECTED Final   Candida krusei NOT DETECTED NOT DETECTED Final   Candida parapsilosis NOT DETECTED NOT DETECTED Final   Candida tropicalis NOT DETECTED NOT DETECTED Final   Cryptococcus neoformans/gattii NOT DETECTED NOT DETECTED Final   CTX-M ESBL DETECTED (A) NOT DETECTED Final    Comment: CRITICAL RESULT CALLED TO, READ BACK BY AND VERIFIED WITH: RN MARN FLORES ON 03/18/23 @ 1759 BY DRT (NOTE) Extended spectrum beta-lactamase detected. Recommend a carbapenem as initial therapy.      Carbapenem  resistance IMP NOT DETECTED NOT DETECTED Final   Carbapenem resistance KPC NOT DETECTED NOT DETECTED Final   Carbapenem resistance NDM NOT DETECTED NOT DETECTED Final   Carbapenem resist OXA 48 LIKE NOT DETECTED NOT DETECTED Final   Carbapenem resistance VIM NOT DETECTED NOT DETECTED Final    Comment: Performed at [facility], [address], [place], [place] [address]  Culture, blood (Routine X 2) w Reflex to ID Panel  Status: None (Preliminary result)   Collection Time: 03/17/23 10:26 PM   Specimen: BLOOD  Result Value Ref Range Status   Specimen Description BLOOD LEFT ANTECUBITAL  Final   Special Requests   Final    BOTTLES DRAWN AEROBIC AND ANAEROBIC Blood Culture results may not be optimal due to an excessive volume of blood received in culture bottles   Culture   Final    NO GROWTH 2 DAYS Performed at [facility], [address] [place], [place] [address]    Report Status PENDING  Incomplete  Urine Culture     Status: Abnormal   Collection Time: 03/18/23  8:30 AM   Specimen: Urine, Clean Catch  Result Value Ref Range Status   Specimen Description   Final    URINE, CLEAN CATCH Performed at [facility], [address], [place], [place] [address]    Special Requests   Final    NONE Reflexed from [id number] Performed at [facility], [address] [place], [place] [address]    Culture (A)  Final    30,000 COLONIES/mL ESCHERICHIA COLI Confirmed Extended Spectrum Beta-Lactamase Producer (ESBL).  In bloodstream infections from ESBL organisms, carbapenems are preferred over piperacillin/tazobactam. They are shown to have a lower risk of mortality.    Report Status 03/20/2023 FINAL  Final   Organism ID, Bacteria ESCHERICHIA COLI (A)  Final      Susceptibility   Escherichia coli - MIC*    AMPICILLIN >=32 RESISTANT Resistant     CEFAZOLIN >=64 RESISTANT Resistant     CEFEPIME >=32 RESISTANT Resistant     CEFTRIAXONE >=64 RESISTANT Resistant     CIPROFLOXACIN >=4  RESISTANT Resistant     GENTAMICIN <=1 SENSITIVE Sensitive     IMIPENEM <=0.25 SENSITIVE Sensitive     NITROFURANTOIN <=16 SENSITIVE Sensitive     TRIMETH/SULFA >=320 RESISTANT Resistant     AMPICILLIN/SULBACTAM >=32 RESISTANT Resistant     PIP/TAZO 8 SENSITIVE Sensitive ug/mL    * 30,000 COLONIES/mL ESCHERICHIA COLI     Radiology Studies: No results found.  Scheduled Meds:  clonazePAM  0.5 mg Oral Daily   divalproex  375 mg Oral BID   enoxaparin (LOVENOX) injection  40 mg Subcutaneous Daily   OLANZapine  5 mg Oral QHS   mouth rinse  15 mL Mouth Rinse 4 times per day   rosuvastatin  10 mg Oral QHS   valbenazine  40 mg Oral Daily   Continuous Infusions:  meropenem (MERREM) IV 1 g (03/20/23 0542)    LOS: 2 days   Time spent: 53 mins  Arinze Rivadeneira Laural Benes, MD How to contact the [facility] Attending or Consulting provider 7A - 7P or covering provider during after hours 7P -7A, for this patient?  Check the care team in [facility] and look for a) attending/consulting [facility] provider listed and b) the [facility] team listed Log into [email or web address] to find provider on call.  Locate the [facility] provider you are looking for under [facility] and page to a number that you can be directly reached. If you still have difficulty reaching the provider, please page the [facility] (Director on Call) for the Hospitalists listed on amion for assistance.  03/20/2023, 9:48 AM

## 2023-03-21 DIAGNOSIS — N390 Urinary tract infection, site not specified: Secondary | ICD-10-CM | POA: Diagnosis not present

## 2023-03-21 DIAGNOSIS — F250 Schizoaffective disorder, bipolar type: Secondary | ICD-10-CM | POA: Diagnosis not present

## 2023-03-21 DIAGNOSIS — G9341 Metabolic encephalopathy: Secondary | ICD-10-CM | POA: Diagnosis not present

## 2023-03-21 DIAGNOSIS — A419 Sepsis, unspecified organism: Secondary | ICD-10-CM | POA: Diagnosis not present

## 2023-03-21 MED ORDER — VALBENAZINE TOSYLATE 40 MG PO CAPS
40.0000 mg | ORAL_CAPSULE | Freq: Every day | ORAL | Status: DC
  Filled 2023-03-21: qty 1

## 2023-03-21 MED ORDER — VALBENAZINE TOSYLATE 60 MG PO CAPS
60.0000 mg | ORAL_CAPSULE | Freq: Every day | ORAL | Status: DC
  Administered 2023-03-21: 60 mg via ORAL
  Filled 2023-03-21 (×2): qty 1

## 2023-03-21 MED ORDER — VALBENAZINE TOSYLATE 40 MG PO CAPS
40.0000 mg | ORAL_CAPSULE | Freq: Every day | ORAL | Status: DC
  Administered 2023-03-22 – 2023-03-25 (×4): 40 mg via ORAL
  Filled 2023-03-21 (×6): qty 1

## 2023-03-21 NOTE — Progress Notes (Signed)
PROGRESS NOTE   Karen Best  WUJ:[id number] DOB: [birth year] DOA: 03/17/2023 PCP: [facility]   Chief Complaint  Patient presents with   Altered Mental Status   Level of care: Med-Surg  Brief Admission History:  67 y.o. female with medical history significant of hyperlipidemia, constipation, schizoaffective disorder history of stroke who presents to the emergency department via EMS from SNF due to altered mental status.  Patient was unable to provide history due to altered mental status, history was obtained from ED physician and ED medical record.  Per report, she was noted to be less interactive yesterday, she was usually very conversational at baseline.  Patient was recently diagnosed to have UTI and received a first dose of ceftriaxone yesterday.  EMS was activated and patient was taken to the ED for further evaluation and management.  At bedside, patient was able to state her name Karen Best), but was unable to provide further history.   In the emergency department, she was febrile with a temperature of 103F, but other vital signs were within normal range.  Workup in the ED showed leukocytosis and MCV of 76.2, BMP was normal except for sodium of 133 and blood glucose of 115.  Lactic acid 2.7 > 2.1, urinalysis was positive for UTI.  Blood culture collected.  Chest x-ray showed no acute abnormality.  Patient was treated with IV ceftriaxone, IV hydration was provided, Tylenol was given due to fever.  Hospitalist was asked to admit patient for further evaluation and management.   Assessment and Plan:  Severe sepsis due to ESBL E coli UTI Patient met sepsis criteria due to being febrile and having leukocytosis (met SIRS criteria), source of infection was UTI, thereby meeting sepsis criteria.  Lactic acid was greater than 2.0, though admitting severe sepsis criteria. Patient was started on IV ceftriaxone and subsequently changed to meropenem when ESBL results were  resulted  Continue Tylenol as needed for fever Blood cultures positive for ESBL E coli  Sepsis physiology resolved   ESBL E coli Bacteremia  - continue IV meropenem  - plan antibiotics for at least 7 days  Lactic acidosis - RESOLVED  Lactic acid 2.7 > 2.1, secondary to sepsis   Acute metabolic encephalopathy - RESOLVED  Secondary to severe sepsis due to UTI Continue management as described above for UTI Continue fall precaution Pt seems to be getting back to baseline and more alert and communicative   Low MCV - iron deficiency anemia MCV 76.2, iron studies ordered -serum iron 16 -TIBC 241 -daily iron supplement ordered  Hyperlipidemia Continue statin   Schizoaffective disorder, bipolar type/tardive dyskinesia Continue Zyprexa, Ingrezza, Klonopin, Depakote    DVT prophylaxis: enoxaparin Code Status: Full  Family Communication:  Disposition: anticipate return to LTC when medically stable    Consultants:   Procedures:   Antimicrobials:  Ceftriaxone 11/20 Meropenem 11/20>>   Subjective: Remains more alert and interactive. No specific complaints.     Objective: Vitals:   03/20/23 0605 03/20/23 1358 03/20/23 2107 03/21/23 0454  BP: (!) 129/56 117/71 122/88 107/74  Pulse: 65 71 65 61  Resp: 16 20 18 18   Temp: 97.7 F (36.5 C)  99.1 F (37.3 C) 98.8 F (37.1 C)  TempSrc:   Axillary Axillary  SpO2: 100% 98% 96% 96%  Weight:      Height:        Intake/Output Summary (Last 24 hours) at 03/21/2023 1118 Last data filed at 03/21/2023 0939 Gross per 24 hour  Intake 1270  ml  Output 1050 ml  Net 220 ml   Filed Weights   03/17/23 2022  Weight: 71 kg   Examination:  General exam: chronically ill and debilitated Appears calm and comfortable  Respiratory system: Clear to auscultation. Respiratory effort normal. Cardiovascular system: normal S1 & S2 heard. No JVD, murmurs, rubs, gallops or clicks. No pedal edema. Gastrointestinal system: Abdomen is  nondistended, soft and nontender. No organomegaly or masses felt. Normal bowel sounds heard. Central nervous system: Alert and oriented to person. No focal neurological deficits. Extremities: Symmetric 5 x 5 power. Skin: No rashes, lesions or ulcers. Psychiatry: Judgement and insight appear diminished. Mood & affect flat.   Data Reviewed: I have personally reviewed following labs and imaging studies  CBC: Recent Labs  Lab 03/17/23 2047 03/18/23 0615 03/19/23 0417 03/20/23 0422  WBC 14.3* 11.0* 12.7* 8.2  NEUTROABS 9.9*  --  8.6* 4.1  HGB 13.1 12.0 11.0* 11.7*  HCT 42.2 39.2 35.6* 38.0  MCV 76.2* 76.7* 76.6* 76.5*  PLT 163 143* 130* 158    Basic Metabolic Panel: Recent Labs  Lab 03/17/23 2047 03/18/23 0615 03/19/23 0417 03/20/23 0422  NA 133* 134* 137 137  K 4.4 4.3 4.2 3.6  CL 100 104 105 107  CO2 23 20* 24 22  GLUCOSE 115* 105* 88 98  BUN 19 17 18 22   CREATININE 0.81 0.73 0.57 0.54  CALCIUM 9.7 9.1 9.3 9.2  MG  --  2.0  --   --   PHOS  --  2.4*  --   --     CBG: No results for input(s): "GLUCAP" in the last 168 hours.  Recent Results (from the past 240 hour(s))  Culture, blood (Routine X 2) w Reflex to ID Panel     Status: Abnormal   Collection Time: 03/17/23  8:47 PM   Specimen: BLOOD  Result Value Ref Range Status   Specimen Description   Final    BLOOD RIGHT ANTECUBITAL Performed at [facility], [address] [place], [place] [address]    Special Requests   Final    BOTTLES DRAWN AEROBIC AND ANAEROBIC Blood Culture results may not be optimal due to an excessive volume of blood received in culture bottles Performed at [facility], [address], [place], [place] [address]    Culture  Setup Time   Final    GRAM NEGATIVE RODS ANAEROBIC BOTTLE ONLY Gram Stain Report Called to,Read Back By and Verified With: SURLES M. AT 1333 ON 132440 BY THOMPSON S. GRAM STAIN REVIEWED-AGREE WITH RESULT DRT CRITICAL RESULT CALLED TO, READ BACK BY AND VERIFIED WITH: RN  MARN FLORES ON 03/18/23 @ 1759 BY DRT AEROBIC BOTTLE ONLY GRAM NEGATIVE RODS GRAM STAIN REVIEWED-AGREE WITH RESULT FH Performed at [facility], [address] [place], [place] [address]    Culture (A)  Final    ESCHERICHIA COLI Confirmed Extended Spectrum Beta-Lactamase Producer (ESBL).  In bloodstream infections from ESBL organisms, carbapenems are preferred over piperacillin/tazobactam. They are shown to have a lower risk of mortality.    Report Status 03/20/2023 FINAL  Final   Organism ID, Bacteria ESCHERICHIA COLI  Final   Organism ID, Bacteria ESCHERICHIA COLI  Final      Susceptibility   Escherichia coli - KIRBY BAUER*    CEFAZOLIN RESISTANT Resistant    Escherichia coli - MIC*    AMPICILLIN >=32 RESISTANT Resistant     CEFEPIME >=32 RESISTANT Resistant     CEFTAZIDIME >=64 RESISTANT Resistant  CEFTRIAXONE >=64 RESISTANT Resistant     CIPROFLOXACIN >=4 RESISTANT Resistant     GENTAMICIN <=1 SENSITIVE Sensitive     IMIPENEM <=0.25 SENSITIVE Sensitive     TRIMETH/SULFA >=320 RESISTANT Resistant     AMPICILLIN/SULBACTAM >=32 RESISTANT Resistant     PIP/TAZO 16 SENSITIVE Sensitive ug/mL    * ESCHERICHIA COLI    ESCHERICHIA COLI  Blood Culture ID Panel (Reflexed)     Status: Abnormal   Collection Time: 03/17/23  8:47 PM  Result Value Ref Range Status   Enterococcus faecalis NOT DETECTED NOT DETECTED Final   Enterococcus Faecium NOT DETECTED NOT DETECTED Final   Listeria monocytogenes NOT DETECTED NOT DETECTED Final   Staphylococcus species NOT DETECTED NOT DETECTED Final   Staphylococcus aureus (BCID) NOT DETECTED NOT DETECTED Final   Staphylococcus epidermidis NOT DETECTED NOT DETECTED Final   Staphylococcus lugdunensis NOT DETECTED NOT DETECTED Final   Streptococcus species NOT DETECTED NOT DETECTED Final   Streptococcus agalactiae NOT DETECTED NOT DETECTED Final   Streptococcus pneumoniae NOT DETECTED NOT DETECTED Final   Streptococcus pyogenes NOT DETECTED NOT  DETECTED Final   A.calcoaceticus-baumannii NOT DETECTED NOT DETECTED Final   Bacteroides fragilis NOT DETECTED NOT DETECTED Final   Enterobacterales DETECTED (A) NOT DETECTED Final    Comment: Enterobacterales represent a large order of gram negative bacteria, not a single organism. CRITICAL RESULT CALLED TO, READ BACK BY AND VERIFIED WITH: RN MARN FLORES ON 03/18/23 @ 1759 BY DRT    Enterobacter cloacae complex NOT DETECTED NOT DETECTED Final   Escherichia coli DETECTED (A) NOT DETECTED Final    Comment: CRITICAL RESULT CALLED TO, READ BACK BY AND VERIFIED WITH: RN MARN FLORES ON 03/18/23 @ 1759 BY DRT    Klebsiella aerogenes NOT DETECTED NOT DETECTED Final   Klebsiella oxytoca NOT DETECTED NOT DETECTED Final   Klebsiella pneumoniae NOT DETECTED NOT DETECTED Final   Proteus species NOT DETECTED NOT DETECTED Final   Salmonella species NOT DETECTED NOT DETECTED Final   Serratia marcescens NOT DETECTED NOT DETECTED Final   Haemophilus influenzae NOT DETECTED NOT DETECTED Final   Neisseria meningitidis NOT DETECTED NOT DETECTED Final   Pseudomonas aeruginosa NOT DETECTED NOT DETECTED Final   Stenotrophomonas maltophilia NOT DETECTED NOT DETECTED Final   Candida albicans NOT DETECTED NOT DETECTED Final   Candida auris NOT DETECTED NOT DETECTED Final   Candida glabrata NOT DETECTED NOT DETECTED Final   Candida krusei NOT DETECTED NOT DETECTED Final   Candida parapsilosis NOT DETECTED NOT DETECTED Final   Candida tropicalis NOT DETECTED NOT DETECTED Final   Cryptococcus neoformans/gattii NOT DETECTED NOT DETECTED Final   CTX-M ESBL DETECTED (A) NOT DETECTED Final    Comment: CRITICAL RESULT CALLED TO, READ BACK BY AND VERIFIED WITH: RN MARN FLORES ON 03/18/23 @ 1759 BY DRT (NOTE) Extended spectrum beta-lactamase detected. Recommend a carbapenem as initial therapy.      Carbapenem resistance IMP NOT DETECTED NOT DETECTED Final   Carbapenem resistance KPC NOT DETECTED NOT DETECTED  Final   Carbapenem resistance NDM NOT DETECTED NOT DETECTED Final   Carbapenem resist OXA 48 LIKE NOT DETECTED NOT DETECTED Final   Carbapenem resistance VIM NOT DETECTED NOT DETECTED Final    Comment: Performed at [facility], [address] [place], [place] [address]  Culture, blood (Routine X 2) w Reflex to ID Panel     Status: None (Preliminary result)   Collection Time: 03/17/23 10:26 PM   Specimen: BLOOD  Result Value Ref Range  Status   Specimen Description BLOOD LEFT ANTECUBITAL  Final   Special Requests   Final    BOTTLES DRAWN AEROBIC AND ANAEROBIC Blood Culture results may not be optimal due to an excessive volume of blood received in culture bottles   Culture   Final    NO GROWTH 3 DAYS Performed at [facility], [address], [place], [place] [address]    Report Status PENDING  Incomplete  Urine Culture     Status: Abnormal   Collection Time: 03/18/23  8:30 AM   Specimen: Urine, Clean Catch  Result Value Ref Range Status   Specimen Description   Final    URINE, CLEAN CATCH Performed at [facility], [address], [place], [place] [address]    Special Requests   Final    NONE Reflexed from [id number] Performed at [facility], [address], [place], [place] [address]    Culture (A)  Final    30,000 COLONIES/mL ESCHERICHIA COLI Confirmed Extended Spectrum Beta-Lactamase Producer (ESBL).  In bloodstream infections from ESBL organisms, carbapenems are preferred over piperacillin/tazobactam. They are shown to have a lower risk of mortality.    Report Status 03/20/2023 FINAL  Final   Organism ID, Bacteria ESCHERICHIA COLI (A)  Final      Susceptibility   Escherichia coli - MIC*    AMPICILLIN >=32 RESISTANT Resistant     CEFAZOLIN >=64 RESISTANT Resistant     CEFEPIME >=32 RESISTANT Resistant     CEFTRIAXONE >=64 RESISTANT Resistant     CIPROFLOXACIN >=4 RESISTANT Resistant     GENTAMICIN <=1 SENSITIVE Sensitive     IMIPENEM <=0.25 SENSITIVE Sensitive      NITROFURANTOIN <=16 SENSITIVE Sensitive     TRIMETH/SULFA >=320 RESISTANT Resistant     AMPICILLIN/SULBACTAM >=32 RESISTANT Resistant     PIP/TAZO 8 SENSITIVE Sensitive ug/mL    * 30,000 COLONIES/mL ESCHERICHIA COLI     Radiology Studies: No results found.  Scheduled Meds:  clonazePAM  0.5 mg Oral Daily   divalproex  375 mg Oral BID   enoxaparin (LOVENOX) injection  40 mg Subcutaneous Daily   ferrous sulfate  325 mg Oral Q breakfast   OLANZapine  5 mg Oral QHS   mouth rinse  15 mL Mouth Rinse 4 times per day   rosuvastatin  10 mg Oral QHS   [START ON 03/23/2023] valbenazine  40 mg Oral Daily   valbenazine  60 mg Oral Daily   Continuous Infusions:  meropenem (MERREM) IV 1 g (03/21/23 0410)    LOS: 3 days   Time spent: 45 mins  Caretha Rumbaugh Laural Benes, MD How to contact the [facility] Attending or Consulting provider 7A - 7P or covering provider during after hours 7P -7A, for this patient?  Check the care team in [facility] and look for a) attending/consulting [facility] provider listed and b) the [facility] team listed Log into [email or web address] to find provider on call.  Locate the [facility] provider you are looking for under [facility] and page to a number that you can be directly reached. If you still have difficulty reaching the provider, please page the [facility] (Director on Call) for the Hospitalists listed on amion for assistance.  03/21/2023, 11:18 AM

## 2023-03-22 DIAGNOSIS — A419 Sepsis, unspecified organism: Secondary | ICD-10-CM | POA: Diagnosis not present

## 2023-03-22 DIAGNOSIS — F250 Schizoaffective disorder, bipolar type: Secondary | ICD-10-CM | POA: Diagnosis not present

## 2023-03-22 DIAGNOSIS — G9341 Metabolic encephalopathy: Secondary | ICD-10-CM | POA: Diagnosis not present

## 2023-03-22 DIAGNOSIS — N390 Urinary tract infection, site not specified: Secondary | ICD-10-CM | POA: Diagnosis not present

## 2023-03-22 MED ORDER — BUTALBITAL-APAP-CAFFEINE 50-325-40 MG PO TABS
1.0000 | ORAL_TABLET | Freq: Once | ORAL | Status: AC
  Administered 2023-03-22: 1 via ORAL
  Filled 2023-03-22: qty 1

## 2023-03-22 NOTE — Plan of Care (Signed)
  Problem: Clinical Measurements: Goal: Ability to maintain clinical measurements within normal limits will improve Outcome: Progressing Goal: Diagnostic test results will improve Outcome: Progressing   Problem: Pain Management: Goal: General experience of comfort will improve Outcome: Progressing   Problem: Safety: Goal: Ability to remain free from injury will improve Outcome: Progressing

## 2023-03-22 NOTE — Progress Notes (Signed)
PROGRESS NOTE   Karen Best  ZOX:[id number] DOB: January 03, 1956 DOA: 03/17/2023 PCP: [facility]   Chief Complaint  Patient presents with   Altered Mental Status   Level of care: Med-Surg  Brief Admission History:  67 y.o. female with medical history significant of hyperlipidemia, constipation, schizoaffective disorder history of stroke who presents to the emergency department via EMS from SNF due to altered mental status.  Patient was unable to provide history due to altered mental status, history was obtained from ED physician and ED medical record.  Per report, she was noted to be less interactive yesterday, she was usually very conversational at baseline.  Patient was recently diagnosed to have UTI and received a first dose of ceftriaxone yesterday.  EMS was activated and patient was taken to the ED for further evaluation and management.  At bedside, patient was able to state her name [name]), but was unable to provide further history.   In the emergency department, she was febrile with a temperature of 103F, but other vital signs were within normal range.  Workup in the ED showed leukocytosis and MCV of 76.2, BMP was normal except for sodium of 133 and blood glucose of 115.  Lactic acid 2.7 > 2.1, urinalysis was positive for UTI.  Blood culture collected.  Chest x-ray showed no acute abnormality.  Patient was treated with IV ceftriaxone, IV hydration was provided, Tylenol was given due to fever.  Hospitalist was asked to admit patient for further evaluation and management.   Assessment and Plan:  Severe sepsis due to ESBL E coli UTI Patient met sepsis criteria due to being febrile and having leukocytosis (met SIRS criteria), source of infection was UTI, thereby meeting sepsis criteria.  Lactic acid was greater than 2.0, though admitting severe sepsis criteria. Patient was started on IV ceftriaxone and subsequently changed to meropenem when ESBL results were  resulted  Continue Tylenol as needed for fever Blood cultures positive for ESBL E coli  Sepsis physiology resolved   ESBL E coli Bacteremia  - continue IV meropenem  - plan antibiotics for at least 7 days  Lactic acidosis - RESOLVED  Lactic acid 2.7 > 2.1, secondary to sepsis   Acute metabolic encephalopathy - RESOLVED  Secondary to severe sepsis due to UTI Continue management as described above for UTI Continue fall precaution Pt seems to be getting back to baseline and more alert and communicative   Low MCV - iron deficiency anemia MCV 76.2, iron studies ordered -serum iron 16 -TIBC 241 -daily iron supplement ordered  Hyperlipidemia Continue statin   Schizoaffective disorder, bipolar type/tardive dyskinesia Continue Zyprexa, Ingrezza, Klonopin, Depakote    DVT prophylaxis: enoxaparin Code Status: Full  Family Communication:  Disposition: anticipate return to LTC when medically stable    Consultants:   Procedures:   Antimicrobials:  Ceftriaxone 11/20 Meropenem 11/20>>   Subjective: Pt says she needs to be turned in the bed.      Objective: Vitals:   03/21/23 0454 03/21/23 1341 03/21/23 2045 03/22/23 0347  BP: 107/74 109/65 131/64 118/71  Pulse: 61 65 63 73  Resp: 18 18 18 17   Temp: 98.8 F (37.1 C) 98 F (36.7 C) 98.3 F (36.8 C) 98.3 F (36.8 C)  TempSrc: Axillary Oral    SpO2: 96% 97% 99% 98%  Weight:      Height:        Intake/Output Summary (Last 24 hours) at 03/22/2023 0951 Last data filed at 03/22/2023 0800 Gross per  24 hour  Intake 948 ml  Output 900 ml  Net 48 ml   Filed Weights   03/17/23 2022  Weight: 71 kg   Examination:  General exam: chronically ill and debilitated Appears calm and comfortable, mucus membranes moist.   Respiratory system: Clear to auscultation. No increased work of breathing. Cardiovascular system: normal S1 & S2 heard. No JVD, murmurs, rubs, gallops or clicks. No pedal edema. Gastrointestinal system:  Abdomen is nondistended, soft and nontender. No organomegaly or masses felt. Normal bowel sounds heard. Central nervous system: Alert and oriented to person. No focal neurological deficits. Extremities: Symmetric 5 x 5 power. Skin: No rashes, lesions or ulcers. Psychiatry: Judgement and insight appear diminished but stable.  Mood & affect normal.   Data Reviewed: I have personally reviewed following labs and imaging studies  CBC: Recent Labs  Lab 03/17/23 2047 03/18/23 0615 03/19/23 0417 03/20/23 0422  WBC 14.3* 11.0* 12.7* 8.2  NEUTROABS 9.9*  --  8.6* 4.1  HGB 13.1 12.0 11.0* 11.7*  HCT 42.2 39.2 35.6* 38.0  MCV 76.2* 76.7* 76.6* 76.5*  PLT 163 143* 130* 158    Basic Metabolic Panel: Recent Labs  Lab 03/17/23 2047 03/18/23 0615 03/19/23 0417 03/20/23 0422  NA 133* 134* 137 137  K 4.4 4.3 4.2 3.6  CL 100 104 105 107  CO2 23 20* 24 22  GLUCOSE 115* 105* 88 98  BUN 19 17 18 22   CREATININE 0.81 0.73 0.57 0.54  CALCIUM 9.7 9.1 9.3 9.2  MG  --  2.0  --   --   PHOS  --  2.4*  --   --     CBG: No results for input(s): "GLUCAP" in the last 168 hours.  Recent Results (from the past 240 hour(s))  Culture, blood (Routine X 2) w Reflex to ID Panel     Status: Abnormal   Collection Time: 03/17/23  8:47 PM   Specimen: BLOOD  Result Value Ref Range Status   Specimen Description   Final    BLOOD RIGHT ANTECUBITAL Performed at [facility], [address], [place], [place] [address]    Special Requests   Final    BOTTLES DRAWN AEROBIC AND ANAEROBIC Blood Culture results may not be optimal due to an excessive volume of blood received in culture bottles Performed at [facility], [address], [place], [place] [address]    Culture  Setup Time   Final    GRAM NEGATIVE RODS ANAEROBIC BOTTLE ONLY Gram Stain Report Called to,Read Back By and Verified With: SURLES M. AT 1333 ON [id number] BY THOMPSON S. GRAM STAIN REVIEWED-AGREE WITH RESULT DRT CRITICAL RESULT CALLED TO, READ BACK  BY AND VERIFIED WITH: RN MARN FLORES ON 03/18/23 @ 1759 BY DRT AEROBIC BOTTLE ONLY GRAM NEGATIVE RODS GRAM STAIN REVIEWED-AGREE WITH RESULT FH Performed at [facility], [address], [place], [place] [address]    Culture (A)  Final    ESCHERICHIA COLI Confirmed Extended Spectrum Beta-Lactamase Producer (ESBL).  In bloodstream infections from ESBL organisms, carbapenems are preferred over piperacillin/tazobactam. They are shown to have a lower risk of mortality.    Report Status 03/20/2023 FINAL  Final   Organism ID, Bacteria ESCHERICHIA COLI  Final   Organism ID, Bacteria ESCHERICHIA COLI  Final      Susceptibility   Escherichia coli - KIRBY BAUER*    CEFAZOLIN RESISTANT Resistant    Escherichia coli - MIC*    AMPICILLIN >=32 RESISTANT Resistant  CEFEPIME >=32 RESISTANT Resistant     CEFTAZIDIME >=64 RESISTANT Resistant     CEFTRIAXONE >=64 RESISTANT Resistant     CIPROFLOXACIN >=4 RESISTANT Resistant     GENTAMICIN <=1 SENSITIVE Sensitive     IMIPENEM <=0.25 SENSITIVE Sensitive     TRIMETH/SULFA >=320 RESISTANT Resistant     AMPICILLIN/SULBACTAM >=32 RESISTANT Resistant     PIP/TAZO 16 SENSITIVE Sensitive ug/mL    * ESCHERICHIA COLI    ESCHERICHIA COLI  Blood Culture ID Panel (Reflexed)     Status: Abnormal   Collection Time: 03/17/23  8:47 PM  Result Value Ref Range Status   Enterococcus faecalis NOT DETECTED NOT DETECTED Final   Enterococcus Faecium NOT DETECTED NOT DETECTED Final   Listeria monocytogenes NOT DETECTED NOT DETECTED Final   Staphylococcus species NOT DETECTED NOT DETECTED Final   Staphylococcus aureus (BCID) NOT DETECTED NOT DETECTED Final   Staphylococcus epidermidis NOT DETECTED NOT DETECTED Final   Staphylococcus lugdunensis NOT DETECTED NOT DETECTED Final   Streptococcus species NOT DETECTED NOT DETECTED Final   Streptococcus agalactiae NOT DETECTED NOT DETECTED Final   Streptococcus pneumoniae NOT DETECTED NOT DETECTED Final   Streptococcus  pyogenes NOT DETECTED NOT DETECTED Final   A.calcoaceticus-baumannii NOT DETECTED NOT DETECTED Final   Bacteroides fragilis NOT DETECTED NOT DETECTED Final   Enterobacterales DETECTED (A) NOT DETECTED Final    Comment: Enterobacterales represent a large order of gram negative bacteria, not a single organism. CRITICAL RESULT CALLED TO, READ BACK BY AND VERIFIED WITH: RN MARN FLORES ON 03/18/23 @ 1759 BY DRT    Enterobacter cloacae complex NOT DETECTED NOT DETECTED Final   Escherichia coli DETECTED (A) NOT DETECTED Final    Comment: CRITICAL RESULT CALLED TO, READ BACK BY AND VERIFIED WITH: RN MARN FLORES ON 03/18/23 @ 1759 BY DRT    Klebsiella aerogenes NOT DETECTED NOT DETECTED Final   Klebsiella oxytoca NOT DETECTED NOT DETECTED Final   Klebsiella pneumoniae NOT DETECTED NOT DETECTED Final   Proteus species NOT DETECTED NOT DETECTED Final   Salmonella species NOT DETECTED NOT DETECTED Final   Serratia marcescens NOT DETECTED NOT DETECTED Final   Haemophilus influenzae NOT DETECTED NOT DETECTED Final   Neisseria meningitidis NOT DETECTED NOT DETECTED Final   Pseudomonas aeruginosa NOT DETECTED NOT DETECTED Final   Stenotrophomonas maltophilia NOT DETECTED NOT DETECTED Final   Candida albicans NOT DETECTED NOT DETECTED Final   Candida auris NOT DETECTED NOT DETECTED Final   Candida glabrata NOT DETECTED NOT DETECTED Final   Candida krusei NOT DETECTED NOT DETECTED Final   Candida parapsilosis NOT DETECTED NOT DETECTED Final   Candida tropicalis NOT DETECTED NOT DETECTED Final   Cryptococcus neoformans/gattii NOT DETECTED NOT DETECTED Final   CTX-M ESBL DETECTED (A) NOT DETECTED Final    Comment: CRITICAL RESULT CALLED TO, READ BACK BY AND VERIFIED WITH: RN MARN FLORES ON 03/18/23 @ 1759 BY DRT (NOTE) Extended spectrum beta-lactamase detected. Recommend a carbapenem as initial therapy.      Carbapenem resistance IMP NOT DETECTED NOT DETECTED Final   Carbapenem resistance KPC NOT  DETECTED NOT DETECTED Final   Carbapenem resistance NDM NOT DETECTED NOT DETECTED Final   Carbapenem resist OXA 48 LIKE NOT DETECTED NOT DETECTED Final   Carbapenem resistance VIM NOT DETECTED NOT DETECTED Final    Comment: Performed at [facility], [address] [place], [place] [address]  Culture, blood (Routine X 2) w Reflex to ID Panel     Status: None (Preliminary result)  Collection Time: 03/17/23 10:26 PM   Specimen: BLOOD  Result Value Ref Range Status   Specimen Description BLOOD LEFT ANTECUBITAL  Final   Special Requests   Final    BOTTLES DRAWN AEROBIC AND ANAEROBIC Blood Culture results may not be optimal due to an excessive volume of blood received in culture bottles   Culture   Final    NO GROWTH 4 DAYS Performed at [facility], [address] [place], [place] [address]    Report Status PENDING  Incomplete  Urine Culture     Status: Abnormal   Collection Time: 03/18/23  8:30 AM   Specimen: Urine, Clean Catch  Result Value Ref Range Status   Specimen Description   Final    URINE, CLEAN CATCH Performed at  [facility], [address], [place], [place] [address]    Special Requests   Final    NONE Reflexed from [id number] Performed at [facility], [address], [place], [place] [address]    Culture (A)  Final    30,000 COLONIES/mL ESCHERICHIA COLI Confirmed Extended Spectrum Beta-Lactamase Producer (ESBL).  In bloodstream infections from ESBL organisms, carbapenems are preferred over piperacillin/tazobactam. They are shown to have a lower risk of mortality.    Report Status 03/20/2023 FINAL  Final   Organism ID, Bacteria ESCHERICHIA COLI (A)  Final      Susceptibility   Escherichia coli - MIC*    AMPICILLIN >=32 RESISTANT Resistant     CEFAZOLIN >=64 RESISTANT Resistant     CEFEPIME >=32 RESISTANT Resistant     CEFTRIAXONE >=64 RESISTANT Resistant     CIPROFLOXACIN >=4 RESISTANT Resistant     GENTAMICIN <=1 SENSITIVE Sensitive     IMIPENEM <=0.25  SENSITIVE Sensitive     NITROFURANTOIN <=16 SENSITIVE Sensitive     TRIMETH/SULFA >=320 RESISTANT Resistant     AMPICILLIN/SULBACTAM >=32 RESISTANT Resistant     PIP/TAZO 8 SENSITIVE Sensitive ug/mL    * 30,000 COLONIES/mL ESCHERICHIA COLI     Radiology Studies: No results found.  Scheduled Meds:  clonazePAM  0.5 mg Oral Daily   divalproex  375 mg Oral BID   enoxaparin (LOVENOX) injection  40 mg Subcutaneous Daily   ferrous sulfate  325 mg Oral Q breakfast   OLANZapine  5 mg Oral QHS   mouth rinse  15 mL Mouth Rinse 4 times per day   rosuvastatin  10 mg Oral QHS   valbenazine  40 mg Oral Daily   Continuous Infusions:  meropenem (MERREM) IV 1 g (03/22/23 0432)    LOS: 4 days   Time spent: 40 mins  Addie Cederberg Laural Benes, MD How to contact the [facility] Attending or Consulting provider 7A - 7P or covering provider during after hours 7P -7A, for this patient?  Check the care team in [facility] and look for a) attending/consulting [facility] provider listed and b) the [facility] team listed Log into [email or web address] to find provider on call.  Locate the [facility] provider you are looking for under [facility] and page to a number that you can be directly reached. If you still have difficulty reaching the provider, please page the [facility] (Director on Call) for the Hospitalists listed on amion for assistance.  03/22/2023, 9:51 AM

## 2023-03-23 DIAGNOSIS — A419 Sepsis, unspecified organism: Secondary | ICD-10-CM | POA: Diagnosis not present

## 2023-03-23 DIAGNOSIS — F250 Schizoaffective disorder, bipolar type: Secondary | ICD-10-CM | POA: Diagnosis not present

## 2023-03-23 DIAGNOSIS — G9341 Metabolic encephalopathy: Secondary | ICD-10-CM | POA: Diagnosis not present

## 2023-03-23 DIAGNOSIS — N390 Urinary tract infection, site not specified: Secondary | ICD-10-CM | POA: Diagnosis not present

## 2023-03-23 LAB — CULTURE, BLOOD (ROUTINE X 2): Culture: NO GROWTH

## 2023-03-23 NOTE — Plan of Care (Signed)
  Problem: Clinical Measurements: Goal: Ability to maintain clinical measurements within normal limits will improve Outcome: Progressing   Problem: Clinical Measurements: Goal: Diagnostic test results will improve Outcome: Progressing   Problem: Pain Management: Goal: General experience of comfort will improve Outcome: Progressing   Problem: Safety: Goal: Ability to remain free from injury will improve Outcome: Progressing

## 2023-03-23 NOTE — Progress Notes (Signed)
Pt stayed awake for the duration of the shift, pt was very talkative and laughing and requesting for staff not to leave beside so she will not feel so alone, pt reoriented to time and place and pt finally fell asleep around 0330.

## 2023-03-23 NOTE — Progress Notes (Signed)
PROGRESS NOTE   Karen Best  VWU:[id number] DOB: Sep 23, 1955 DOA: 03/17/2023 PCP: [facility]   Chief Complaint  Patient presents with   Altered Mental Status   Level of care: Med-Surg  Brief Admission History:  67 y.o. female with medical history significant of hyperlipidemia, constipation, schizoaffective disorder history of stroke who presents to the emergency department via EMS from SNF due to altered mental status.  Patient was unable to provide history due to altered mental status, history was obtained from ED physician and ED medical record.  Per report, she was noted to be less interactive yesterday, she was usually very conversational at baseline.  Patient was recently diagnosed to have UTI and received a first dose of ceftriaxone yesterday.  EMS was activated and patient was taken to the ED for further evaluation and management.  At bedside, patient was able to state her name Karen Best), but was unable to provide further history.   In the emergency department, she was febrile with a temperature of 103F, but other vital signs were within normal range.  Workup in the ED showed leukocytosis and MCV of 76.2, BMP was normal except for sodium of 133 and blood glucose of 115.  Lactic acid 2.7 > 2.1, urinalysis was positive for UTI.  Blood culture collected.  Chest x-ray showed no acute abnormality.  Patient was treated with IV ceftriaxone, IV hydration was provided, Tylenol was given due to fever.  Hospitalist was asked to admit patient for further evaluation and management.   Assessment and Plan:  Severe sepsis due to ESBL E coli UTI Patient met sepsis criteria due to being febrile and having leukocytosis (met SIRS criteria), source of infection was UTI, thereby meeting sepsis criteria.  Lactic acid was greater than 2.0, though admitting severe sepsis criteria. Patient was started on IV ceftriaxone and subsequently changed to meropenem when ESBL results were  resulted  Continue Tylenol as needed for fever Blood cultures positive for ESBL E coli  Sepsis physiology resolved   ESBL E coli Bacteremia  - continue IV meropenem  - planning to complete 7 days IV meropenem due to severe sepsis presentation - discussed with pharm D, meropenem to complete on Wed 11/27.   Lactic acidosis - RESOLVED  Lactic acid 2.7 > 2.1, secondary to sepsis   Acute metabolic encephalopathy - RESOLVED  Secondary to severe sepsis due to UTI Continue management as described above for UTI Continue fall precaution Pt seems to be getting back to baseline and more alert and communicative   Low MCV - iron deficiency anemia MCV 76.2, iron studies ordered -serum iron 16 -TIBC 241 -daily iron supplement ordered  Hyperlipidemia Continue statin   Schizoaffective disorder, bipolar type/tardive dyskinesia Continue Zyprexa, Ingrezza, Klonopin, Depakote    DVT prophylaxis: enoxaparin Code Status: Full  Family Communication:  Disposition: anticipate return to LTC on 11/27 after completing meropenem    Consultants:   Procedures:   Antimicrobials:  Ceftriaxone 11/20 Meropenem 11/20>>   Subjective: Continues showing improvements daily; eating better today, no specific complaints.       Objective: Vitals:   03/22/23 0347 03/22/23 1409 03/22/23 2010 03/23/23 0508  BP: 118/71 134/67 131/81 92/60  Pulse: 73 69 (!) 59 (!) 59  Resp: 17  17 18   Temp: 98.3 F (36.8 C) (!) 97.4 F (36.3 C) 97.9 F (36.6 C) 98 F (36.7 C)  TempSrc:  Oral Oral Axillary  SpO2: 98% 99% 98% 92%  Weight:      Height:  Intake/Output Summary (Last 24 hours) at 03/23/2023 1242 Last data filed at 03/23/2023 0835 Gross per 24 hour  Intake 240 ml  Output --  Net 240 ml   Filed Weights   03/17/23 2022  Weight: 71 kg   Examination:  General exam: chronically ill and debilitated Appears calm and comfortable, mucus membranes moist.   Respiratory system: Clear to auscultation.  No increased work of breathing. Cardiovascular system: normal S1 & S2 heard. No JVD, murmurs, rubs, gallops or clicks. No pedal edema. Gastrointestinal system: Abdomen is nondistended, soft and nontender. No organomegaly or masses felt. Normal bowel sounds heard. Central nervous system: Alert and oriented to person. No focal neurological deficits. Extremities: Symmetric 5 x 5 power. Skin: No rashes, lesions or ulcers. Psychiatry: Judgement and insight appear diminished but stable.  Mood & affect normal.   Data Reviewed: I have personally reviewed following labs and imaging studies  CBC: Recent Labs  Lab 03/17/23 2047 03/18/23 0615 03/19/23 0417 03/20/23 0422  WBC 14.3* 11.0* 12.7* 8.2  NEUTROABS 9.9*  --  8.6* 4.1  HGB 13.1 12.0 11.0* 11.7*  HCT 42.2 39.2 35.6* 38.0  MCV 76.2* 76.7* 76.6* 76.5*  PLT 163 143* 130* 158    Basic Metabolic Panel: Recent Labs  Lab 03/17/23 2047 03/18/23 0615 03/19/23 0417 03/20/23 0422  NA 133* 134* 137 137  K 4.4 4.3 4.2 3.6  CL 100 104 105 107  CO2 23 20* 24 22  GLUCOSE 115* 105* 88 98  BUN 19 17 18 22   CREATININE 0.81 0.73 0.57 0.54  CALCIUM 9.7 9.1 9.3 9.2  MG  --  2.0  --   --   PHOS  --  2.4*  --   --     CBG: No results for input(s): "GLUCAP" in the last 168 hours.  Recent Results (from the past 240 hour(s))  Culture, blood (Routine X 2) w Reflex to ID Panel     Status: Abnormal   Collection Time: 03/17/23  8:47 PM   Specimen: BLOOD  Result Value Ref Range Status   Specimen Description   Final    BLOOD RIGHT ANTECUBITAL Performed at [facility], [address] [place], [place] [address]    Special Requests   Final    BOTTLES DRAWN AEROBIC AND ANAEROBIC Blood Culture results may not be optimal due to an excessive volume of blood received in culture bottles Performed at [facility], [address] [place], [place] [address]    Culture  Setup Time   Final    GRAM NEGATIVE RODS ANAEROBIC BOTTLE ONLY Gram Stain Report  Called to,Read Back By and Verified With: SURLES M. AT 1333 ON [id number] BY THOMPSON S. GRAM STAIN REVIEWED-AGREE WITH RESULT DRT CRITICAL RESULT CALLED TO, READ BACK BY AND VERIFIED WITH: RN MARN FLORES ON 03/18/23 @ 1759 BY DRT AEROBIC BOTTLE ONLY GRAM NEGATIVE RODS GRAM STAIN REVIEWED-AGREE WITH RESULT FH Performed at [facility], [address], [place], [place] [address]    Culture (A)  Final    ESCHERICHIA COLI Confirmed Extended Spectrum Beta-Lactamase Producer (ESBL).  In bloodstream infections from ESBL organisms, carbapenems are preferred over piperacillin/tazobactam. They are shown to have a lower risk of mortality.    Report Status 03/20/2023 FINAL  Final   Organism ID, Bacteria ESCHERICHIA COLI  Final   Organism ID, Bacteria ESCHERICHIA COLI  Final      Susceptibility   Escherichia coli - KIRBY BAUER*    CEFAZOLIN RESISTANT Resistant  Escherichia coli - MIC*    AMPICILLIN >=32 RESISTANT Resistant     CEFEPIME >=32 RESISTANT Resistant     CEFTAZIDIME >=64 RESISTANT Resistant     CEFTRIAXONE >=64 RESISTANT Resistant     CIPROFLOXACIN >=4 RESISTANT Resistant     GENTAMICIN <=1 SENSITIVE Sensitive     IMIPENEM <=0.25 SENSITIVE Sensitive     TRIMETH/SULFA >=320 RESISTANT Resistant     AMPICILLIN/SULBACTAM >=32 RESISTANT Resistant     PIP/TAZO 16 SENSITIVE Sensitive ug/mL    * ESCHERICHIA COLI    ESCHERICHIA COLI  Blood Culture ID Panel (Reflexed)     Status: Abnormal   Collection Time: 03/17/23  8:47 PM  Result Value Ref Range Status   Enterococcus faecalis NOT DETECTED NOT DETECTED Final   Enterococcus Faecium NOT DETECTED NOT DETECTED Final   Listeria monocytogenes NOT DETECTED NOT DETECTED Final   Staphylococcus species NOT DETECTED NOT DETECTED Final   Staphylococcus aureus (BCID) NOT DETECTED NOT DETECTED Final   Staphylococcus epidermidis NOT DETECTED NOT DETECTED Final   Staphylococcus lugdunensis NOT DETECTED NOT DETECTED Final   Streptococcus species NOT  DETECTED NOT DETECTED Final   Streptococcus agalactiae NOT DETECTED NOT DETECTED Final   Streptococcus pneumoniae NOT DETECTED NOT DETECTED Final   Streptococcus pyogenes NOT DETECTED NOT DETECTED Final   A.calcoaceticus-baumannii NOT DETECTED NOT DETECTED Final   Bacteroides fragilis NOT DETECTED NOT DETECTED Final   Enterobacterales DETECTED (A) NOT DETECTED Final    Comment: Enterobacterales represent a large order of gram negative bacteria, not a single organism. CRITICAL RESULT CALLED TO, READ BACK BY AND VERIFIED WITH: RN MARN FLORES ON 03/18/23 @ 1759 BY DRT    Enterobacter cloacae complex NOT DETECTED NOT DETECTED Final   Escherichia coli DETECTED (A) NOT DETECTED Final    Comment: CRITICAL RESULT CALLED TO, READ BACK BY AND VERIFIED WITH: RN MARN FLORES ON 03/18/23 @ 1759 BY DRT    Klebsiella aerogenes NOT DETECTED NOT DETECTED Final   Klebsiella oxytoca NOT DETECTED NOT DETECTED Final   Klebsiella pneumoniae NOT DETECTED NOT DETECTED Final   Proteus species NOT DETECTED NOT DETECTED Final   Salmonella species NOT DETECTED NOT DETECTED Final   Serratia marcescens NOT DETECTED NOT DETECTED Final   Haemophilus influenzae NOT DETECTED NOT DETECTED Final   Neisseria meningitidis NOT DETECTED NOT DETECTED Final   Pseudomonas aeruginosa NOT DETECTED NOT DETECTED Final   Stenotrophomonas maltophilia NOT DETECTED NOT DETECTED Final   Candida albicans NOT DETECTED NOT DETECTED Final   Candida auris NOT DETECTED NOT DETECTED Final   Candida glabrata NOT DETECTED NOT DETECTED Final   Candida krusei NOT DETECTED NOT DETECTED Final   Candida parapsilosis NOT DETECTED NOT DETECTED Final   Candida tropicalis NOT DETECTED NOT DETECTED Final   Cryptococcus neoformans/gattii NOT DETECTED NOT DETECTED Final   CTX-M ESBL DETECTED (A) NOT DETECTED Final    Comment: CRITICAL RESULT CALLED TO, READ BACK BY AND VERIFIED WITH: RN MARN FLORES ON 03/18/23 @ 1759 BY DRT (NOTE) Extended spectrum  beta-lactamase detected. Recommend a carbapenem as initial therapy.      Carbapenem resistance IMP NOT DETECTED NOT DETECTED Final   Carbapenem resistance KPC NOT DETECTED NOT DETECTED Final   Carbapenem resistance NDM NOT DETECTED NOT DETECTED Final   Carbapenem resist OXA 48 LIKE NOT DETECTED NOT DETECTED Final   Carbapenem resistance VIM NOT DETECTED NOT DETECTED Final    Comment: Performed at [facility], [address] [place], [place] [address]  Culture, blood (Routine  X 2) w Reflex to ID Panel     Status: None   Collection Time: 03/17/23 10:26 PM   Specimen: BLOOD  Result Value Ref Range Status   Specimen Description BLOOD LEFT ANTECUBITAL  Final   Special Requests   Final    BOTTLES DRAWN AEROBIC AND ANAEROBIC Blood Culture results may not be optimal due to an excessive volume of blood received in culture bottles   Culture   Final    NO GROWTH 5 DAYS Performed at [facility], [address], [place], [place] [address]    Report Status 03/23/2023 FINAL  Final  Urine Culture     Status: Abnormal   Collection Time: 03/18/23  8:30 AM   Specimen: Urine, Clean Catch  Result Value Ref Range Status   Specimen Description   Final    URINE, CLEAN CATCH Performed at [facility], [address], [place], [place] [address]    Special Requests   Final    NONE Reflexed from [id number] Performed at [facility] [address], [place], [place] [address]    Culture (A)  Final    30,000 COLONIES/mL ESCHERICHIA COLI Confirmed Extended Spectrum Beta-Lactamase Producer (ESBL).  In bloodstream infections from ESBL organisms, carbapenems are preferred over piperacillin/tazobactam. They are shown to have a lower risk of mortality.    Report Status 03/20/2023 FINAL  Final   Organism ID, Bacteria ESCHERICHIA COLI (A)  Final      Susceptibility   Escherichia coli - MIC*    AMPICILLIN >=32 RESISTANT Resistant     CEFAZOLIN >=64 RESISTANT Resistant     CEFEPIME >=32 RESISTANT  Resistant     CEFTRIAXONE >=64 RESISTANT Resistant     CIPROFLOXACIN >=4 RESISTANT Resistant     GENTAMICIN <=1 SENSITIVE Sensitive     IMIPENEM <=0.25 SENSITIVE Sensitive     NITROFURANTOIN <=16 SENSITIVE Sensitive     TRIMETH/SULFA >=320 RESISTANT Resistant     AMPICILLIN/SULBACTAM >=32 RESISTANT Resistant     PIP/TAZO 8 SENSITIVE Sensitive ug/mL    * 30,000 COLONIES/mL ESCHERICHIA COLI     Radiology Studies: No results found.  Scheduled Meds:  clonazePAM  0.5 mg Oral Daily   divalproex  375 mg Oral BID   enoxaparin (LOVENOX) injection  40 mg Subcutaneous Daily   ferrous sulfate  325 mg Oral Q breakfast   OLANZapine  5 mg Oral QHS   mouth rinse  15 mL Mouth Rinse 4 times per day   rosuvastatin  10 mg Oral QHS   valbenazine  40 mg Oral Daily   Continuous Infusions:  meropenem (MERREM) IV 1 g (03/23/23 1232)    LOS: 5 days   Time spent: 44 mins  Sherrell Farish Laural Benes, MD How to contact the [facility] Attending or Consulting provider 7A - 7P or covering provider during after hours 7P -7A, for this patient?  Check the care team in [facility] and look for a) attending/consulting [facility] provider listed and b) the [facility] team listed Log into [email or web address] to find provider on call.  Locate the [facility] provider you are looking for under [facility] and page to a number that you can be directly reached. If you still have difficulty reaching the provider, please page the [facility] (Director on Call) for the Hospitalists listed on amion for assistance.  03/23/2023, 12:42 PM

## 2023-03-23 NOTE — Plan of Care (Signed)
  Problem: Clinical Measurements: Goal: Ability to maintain clinical measurements within normal limits will improve Outcome: Progressing Goal: Will remain free from infection Outcome: Progressing Goal: Diagnostic test results will improve Outcome: Progressing   Problem: Elimination: Goal: Will not experience complications related to urinary retention Outcome: Progressing   Problem: Pain Management: Goal: General experience of comfort will improve Outcome: Progressing   Problem: Safety: Goal: Ability to remain free from injury will improve Outcome: Progressing

## 2023-03-23 NOTE — TOC Progression Note (Addendum)
Transition of Care [facility]) - Progression Note    Patient Details  Name: KHOU TUREAUD MRN: [id number] Date of Birth: 1955/07/08  [facility] CM/SW Contact  Karn [place], [place] Phone Number: 03/23/2023, 10:10 AM  Clinical Narrative:  Per MD, anticipate d/c Wednesday back to SNF. [facility]. TOC will follow.      Expected Discharge Plan: Long Term Nursing Home Barriers to Discharge: Continued Medical Work up  Expected Discharge Plan and Services In-house Referral: Clinical Social Work   Post Acute Care Choice: Nursing Home Living arrangements for the past 2 months: Skilled Nursing Facility                                       Social Determinants of Health (SDOH) Interventions SDOH Screenings   Food Insecurity: Patient Unable To Answer (03/19/2023)  Housing: Patient Unable To Answer (03/19/2023)  Transportation Needs: Patient Unable To Answer (03/19/2023)  Utilities: Patient Unable To Answer (03/19/2023)  Depression (PHQ2-9): Low Risk  (02/06/2022)  Tobacco Use: High Risk (03/17/2023)    Readmission Risk Interventions    02/25/2021    1:11 PM 09/25/2020    9:54 AM  Readmission Risk Prevention Plan  Transportation Screening Complete Complete  PCP or Specialist Appt within 5-7 Days  Complete  PCP or Specialist Appt within 3-5 Days Not Complete   Not Complete comments Three attempts made to call [facility]--phone rings and rings but no answer, cannot leave mesage.   Home Care Screening  Complete  Medication Review (RN CM)  Complete  HRI or Home Care Consult Complete   Social Work Consult for Recovery Care Planning/Counseling Complete   Palliative Care Screening Not Applicable

## 2023-03-24 DIAGNOSIS — A419 Sepsis, unspecified organism: Secondary | ICD-10-CM | POA: Diagnosis not present

## 2023-03-24 DIAGNOSIS — N390 Urinary tract infection, site not specified: Secondary | ICD-10-CM | POA: Diagnosis not present

## 2023-03-24 DIAGNOSIS — F250 Schizoaffective disorder, bipolar type: Secondary | ICD-10-CM | POA: Diagnosis not present

## 2023-03-24 DIAGNOSIS — G9341 Metabolic encephalopathy: Secondary | ICD-10-CM | POA: Diagnosis not present

## 2023-03-24 MED ORDER — MORPHINE SULFATE (PF) 2 MG/ML IV SOLN
2.0000 mg | INTRAVENOUS | Status: DC | PRN
  Administered 2023-03-24 – 2023-03-25 (×4): 2 mg via INTRAVENOUS
  Filled 2023-03-24 (×4): qty 1

## 2023-03-24 NOTE — Plan of Care (Signed)
  Problem: Clinical Measurements: Goal: Ability to maintain clinical measurements within normal limits will improve Outcome: Progressing   Problem: Clinical Measurements: Goal: Will remain free from infection Outcome: Progressing   Problem: Clinical Measurements: Goal: Diagnostic test results will improve Outcome: Progressing   

## 2023-03-24 NOTE — Plan of Care (Signed)
Pt was restless and uncomfortable during night. Complained of buttock pain and leg pain. Felt Tylenol was not working. Contacted on-call dr, PRN Morphine given. Pt felt much better. Elevated pts feet on pillow to relieve discomfort. Pt drifted off to sleep, rise and fall of chest visible.

## 2023-03-24 NOTE — Progress Notes (Signed)
PROGRESS NOTE   Karen Best  OZD:[id number] DOB: [birth year] DOA: 03/17/2023 PCP: [facility]   Chief Complaint  Patient presents with   Altered Mental Status   Level of care: Med-Surg  Brief Admission History:  67 y.o. female with medical history significant of hyperlipidemia, constipation, schizoaffective disorder history of stroke who presents to the emergency department via EMS from SNF due to altered mental status.  Patient was unable to provide history due to altered mental status, history was obtained from ED physician and ED medical record.  Per report, she was noted to be less interactive yesterday, she was usually very conversational at baseline.  Patient was recently diagnosed to have UTI and received a first dose of ceftriaxone yesterday.  EMS was activated and patient was taken to the ED for further evaluation and management.  At bedside, patient was able to state her name Yamada), but was unable to provide further history.   In the emergency department, she was febrile with a temperature of 103F, but other vital signs were within normal range.  Workup in the ED showed leukocytosis and MCV of 76.2, BMP was normal except for sodium of 133 and blood glucose of 115.  Lactic acid 2.7 > 2.1, urinalysis was positive for UTI.  Blood culture collected.  Chest x-ray showed no acute abnormality.  Patient was treated with IV ceftriaxone, IV hydration was provided, Tylenol was given due to fever.  Hospitalist was asked to admit patient for further evaluation and management.   Assessment and Plan:  Severe sepsis due to ESBL E coli UTI Patient was initially started on IV ceftriaxone and subsequently changed to meropenem when ESBL results were resulted  High fevers have defervesced Blood cultures positive for ESBL E coli  Sepsis physiology resolved   ESBL E coli Bacteremia  - continue IV meropenem  - planning to complete 7 days IV meropenem due to severe  sepsis presentation - discussed with pharm D, meropenem to complete on Wed 11/27.  - plan to DC back to longterm care after meropenem completes on 11/27  Lactic acidosis - RESOLVED  Lactic acid 2.7 > 2.1, secondary to sepsis   Acute metabolic encephalopathy - RESOLVED  Secondary to severe sepsis due to UTI Continue management as described above for UTI Continue fall precaution Pt seems to be getting back to baseline and more alert and communicative   Low MCV - iron deficiency anemia MCV 76.2, iron studies ordered -serum iron 16 -TIBC 241 -daily iron supplement ordered  Hyperlipidemia Continue statin   Schizoaffective disorder, bipolar type/tardive dyskinesia Continue Zyprexa, Ingrezza, Klonopin, Depakote    DVT prophylaxis: enoxaparin Code Status: Full  Family Communication:  Disposition: anticipate return to LTC on 11/27 after completing meropenem    Consultants:   Procedures:   Antimicrobials:  Ceftriaxone 11/20 x 1 Meropenem 11/20>>   Subjective: Pt was a little agitated overnight but seems back to baseline this morning.       Objective: Vitals:   03/23/23 0508 03/23/23 1500 03/23/23 2016 03/24/23 0400  BP: 92/60 100/72 118/79 125/79  Pulse: (!) 59 67 78 68  Resp: 18 18 20 18   Temp: 98 F (36.7 C) 98.7 F (37.1 C) 97.9 F (36.6 C) 97.7 F (36.5 C)  TempSrc: Axillary Oral Oral Oral  SpO2: 92% 97% 100%   Weight:      Height:        Intake/Output Summary (Last 24 hours) at 03/24/2023 1110 Last data filed at 03/24/2023  1041 Gross per 24 hour  Intake 360 ml  Output 1350 ml  Net -990 ml   Filed Weights   03/17/23 2022  Weight: 71 kg   Examination:  General exam: chronically ill and debilitated Appears calm and comfortable, mucus membranes moist. edentulous   Respiratory system: Clear to auscultation. No increased work of breathing. Cardiovascular system: normal S1 & S2 heard. No JVD, murmurs, rubs, gallops or clicks. No pedal  edema. Gastrointestinal system: Abdomen is nondistended, soft and nontender. No organomegaly or masses felt. Normal bowel sounds heard. Central nervous system: Alert and oriented to person. No focal neurological deficits. Extremities: Symmetric 5 x 5 power. Skin: No rashes, lesions or ulcers. Psychiatry: Judgement and insight appear diminished but stable.  Mood & affect normal.   Data Reviewed: I have personally reviewed following labs and imaging studies  CBC: Recent Labs  Lab 03/17/23 2047 03/18/23 0615 03/19/23 0417 03/20/23 0422  WBC 14.3* 11.0* 12.7* 8.2  NEUTROABS 9.9*  --  8.6* 4.1  HGB 13.1 12.0 11.0* 11.7*  HCT 42.2 39.2 35.6* 38.0  MCV 76.2* 76.7* 76.6* 76.5*  PLT 163 143* 130* 158    Basic Metabolic Panel: Recent Labs  Lab 03/17/23 2047 03/18/23 0615 03/19/23 0417 03/20/23 0422  NA 133* 134* 137 137  K 4.4 4.3 4.2 3.6  CL 100 104 105 107  CO2 23 20* 24 22  GLUCOSE 115* 105* 88 98  BUN 19 17 18 22   CREATININE 0.81 0.73 0.57 0.54  CALCIUM 9.7 9.1 9.3 9.2  MG  --  2.0  --   --   PHOS  --  2.4*  --   --     CBG: No results for input(s): "GLUCAP" in the last 168 hours.  Recent Results (from the past 240 hour(s))  Culture, blood (Routine X 2) w Reflex to ID Panel     Status: Abnormal   Collection Time: 03/17/23  8:47 PM   Specimen: BLOOD  Result Value Ref Range Status   Specimen Description   Final    BLOOD RIGHT ANTECUBITAL Performed at [facility], [address] [place], [place] [address]    Special Requests   Final    BOTTLES DRAWN AEROBIC AND ANAEROBIC Blood Culture results may not be optimal due to an excessive volume of blood received in culture bottles Performed at [facility], [address], [place], [place] [address]    Culture  Setup Time   Final    GRAM NEGATIVE RODS ANAEROBIC BOTTLE ONLY Gram Stain Report Called to,Read Back By and Verified With: SURLES M. AT 1333 ON [id number] BY THOMPSON S. GRAM STAIN REVIEWED-AGREE WITH RESULT  DRT CRITICAL RESULT CALLED TO, READ BACK BY AND VERIFIED WITH: RN MARN FLORES ON 03/18/23 @ 1759 BY DRT AEROBIC BOTTLE ONLY GRAM NEGATIVE RODS GRAM STAIN REVIEWED-AGREE WITH RESULT FH Performed at [facility], [address] [place], [place] [address]    Culture (A)  Final    ESCHERICHIA COLI Confirmed Extended Spectrum Beta-Lactamase Producer (ESBL).  In bloodstream infections from ESBL organisms, carbapenems are preferred over piperacillin/tazobactam. They are shown to have a lower risk of mortality.    Report Status 03/20/2023 FINAL  Final   Organism ID, Bacteria ESCHERICHIA COLI  Final   Organism ID, Bacteria ESCHERICHIA COLI  Final      Susceptibility   Escherichia coli - KIRBY BAUER*    CEFAZOLIN RESISTANT Resistant    Escherichia coli - MIC*    AMPICILLIN >=32 RESISTANT  Resistant     CEFEPIME >=32 RESISTANT Resistant     CEFTAZIDIME >=64 RESISTANT Resistant     CEFTRIAXONE >=64 RESISTANT Resistant     CIPROFLOXACIN >=4 RESISTANT Resistant     GENTAMICIN <=1 SENSITIVE Sensitive     IMIPENEM <=0.25 SENSITIVE Sensitive     TRIMETH/SULFA >=320 RESISTANT Resistant     AMPICILLIN/SULBACTAM >=32 RESISTANT Resistant     PIP/TAZO 16 SENSITIVE Sensitive ug/mL    * ESCHERICHIA COLI    ESCHERICHIA COLI  Blood Culture ID Panel (Reflexed)     Status: Abnormal   Collection Time: 03/17/23  8:47 PM  Result Value Ref Range Status   Enterococcus faecalis NOT DETECTED NOT DETECTED Final   Enterococcus Faecium NOT DETECTED NOT DETECTED Final   Listeria monocytogenes NOT DETECTED NOT DETECTED Final   Staphylococcus species NOT DETECTED NOT DETECTED Final   Staphylococcus aureus (BCID) NOT DETECTED NOT DETECTED Final   Staphylococcus epidermidis NOT DETECTED NOT DETECTED Final   Staphylococcus lugdunensis NOT DETECTED NOT DETECTED Final   Streptococcus species NOT DETECTED NOT DETECTED Final   Streptococcus agalactiae NOT DETECTED NOT DETECTED Final   Streptococcus pneumoniae NOT  DETECTED NOT DETECTED Final   Streptococcus pyogenes NOT DETECTED NOT DETECTED Final   A.calcoaceticus-baumannii NOT DETECTED NOT DETECTED Final   Bacteroides fragilis NOT DETECTED NOT DETECTED Final   Enterobacterales DETECTED (A) NOT DETECTED Final    Comment: Enterobacterales represent a large order of gram negative bacteria, not a single organism. CRITICAL RESULT CALLED TO, READ BACK BY AND VERIFIED WITH: RN MARN FLORES ON 03/18/23 @ 1759 BY DRT    Enterobacter cloacae complex NOT DETECTED NOT DETECTED Final   Escherichia coli DETECTED (A) NOT DETECTED Final    Comment: CRITICAL RESULT CALLED TO, READ BACK BY AND VERIFIED WITH: RN MARN FLORES ON 03/18/23 @ 1759 BY DRT    Klebsiella aerogenes NOT DETECTED NOT DETECTED Final   Klebsiella oxytoca NOT DETECTED NOT DETECTED Final   Klebsiella pneumoniae NOT DETECTED NOT DETECTED Final   Proteus species NOT DETECTED NOT DETECTED Final   Salmonella species NOT DETECTED NOT DETECTED Final   Serratia marcescens NOT DETECTED NOT DETECTED Final   Haemophilus influenzae NOT DETECTED NOT DETECTED Final   Neisseria meningitidis NOT DETECTED NOT DETECTED Final   Pseudomonas aeruginosa NOT DETECTED NOT DETECTED Final   Stenotrophomonas maltophilia NOT DETECTED NOT DETECTED Final   Candida albicans NOT DETECTED NOT DETECTED Final   Candida auris NOT DETECTED NOT DETECTED Final   Candida glabrata NOT DETECTED NOT DETECTED Final   Candida krusei NOT DETECTED NOT DETECTED Final   Candida parapsilosis NOT DETECTED NOT DETECTED Final   Candida tropicalis NOT DETECTED NOT DETECTED Final   Cryptococcus neoformans/gattii NOT DETECTED NOT DETECTED Final   CTX-M ESBL DETECTED (A) NOT DETECTED Final    Comment: CRITICAL RESULT CALLED TO, READ BACK BY AND VERIFIED WITH: RN MARN FLORES ON 03/18/23 @ 1759 BY DRT (NOTE) Extended spectrum beta-lactamase detected. Recommend a carbapenem as initial therapy.      Carbapenem resistance IMP NOT DETECTED NOT  DETECTED Final   Carbapenem resistance KPC NOT DETECTED NOT DETECTED Final   Carbapenem resistance NDM NOT DETECTED NOT DETECTED Final   Carbapenem resist OXA 48 LIKE NOT DETECTED NOT DETECTED Final   Carbapenem resistance VIM NOT DETECTED NOT DETECTED Final    Comment: Performed at [facility], [address], [place], [place] [address]  Culture, blood (Routine X 2) w Reflex to ID Panel  Status: None   Collection Time: 03/17/23 10:26 PM   Specimen: BLOOD  Result Value Ref Range Status   Specimen Description BLOOD LEFT ANTECUBITAL  Final   Special Requests   Final    BOTTLES DRAWN AEROBIC AND ANAEROBIC Blood Culture results may not be optimal due to an excessive volume of blood received in culture bottles   Culture   Final    NO GROWTH 5 DAYS Performed at [facility], [address], [place], [place] [address]    Report Status 03/23/2023 FINAL  Final  Urine Culture     Status: Abnormal   Collection Time: 03/18/23  8:30 AM   Specimen: Urine, Clean Catch  Result Value Ref Range Status   Specimen Description   Final    URINE, CLEAN CATCH Performed at [facility], [address] [place], [place] [address]    Special Requests   Final    NONE Reflexed from [id number] Performed at [facility], [address] [place], [place] [address]    Culture (A)  Final    30,000 COLONIES/mL ESCHERICHIA COLI Confirmed Extended Spectrum Beta-Lactamase Producer (ESBL).  In bloodstream infections from ESBL organisms, carbapenems are preferred over piperacillin/tazobactam. They are shown to have a lower risk of mortality.    Report Status 03/20/2023 FINAL  Final   Organism ID, Bacteria ESCHERICHIA COLI (A)  Final      Susceptibility   Escherichia coli - MIC*    AMPICILLIN >=32 RESISTANT Resistant     CEFAZOLIN >=64 RESISTANT Resistant     CEFEPIME >=32 RESISTANT Resistant     CEFTRIAXONE >=64 RESISTANT Resistant     CIPROFLOXACIN >=4 RESISTANT Resistant     GENTAMICIN <=1 SENSITIVE  Sensitive     IMIPENEM <=0.25 SENSITIVE Sensitive     NITROFURANTOIN <=16 SENSITIVE Sensitive     TRIMETH/SULFA >=320 RESISTANT Resistant     AMPICILLIN/SULBACTAM >=32 RESISTANT Resistant     PIP/TAZO 8 SENSITIVE Sensitive ug/mL    * 30,000 COLONIES/mL ESCHERICHIA COLI     Radiology Studies: No results found.  Scheduled Meds:  clonazePAM  0.5 mg Oral Daily   divalproex  375 mg Oral BID   enoxaparin (LOVENOX) injection  40 mg Subcutaneous Daily   ferrous sulfate  325 mg Oral Q breakfast   OLANZapine  5 mg Oral QHS   mouth rinse  15 mL Mouth Rinse 4 times per day   rosuvastatin  10 mg Oral QHS   valbenazine  40 mg Oral Daily   Continuous Infusions:  meropenem (MERREM) IV 1 g (03/24/23 0444)    LOS: 6 days   Time spent: 40 mins  Keltin Baird Laural Benes, MD How to contact the [facility] Attending or Consulting provider 7A - 7P or covering provider during after hours 7P -7A, for this patient?  Check the care team in [facility] and look for a) attending/consulting TRH provider listed and b) the [facility] team listed Log into [email or web address] to find provider on call.  Locate the [facility] provider you are looking for under [facility] and page to a number that you can be directly reached. If you still have difficulty reaching the provider, please page the [facility] (Director on Call) for the Hospitalists listed on amion for assistance.  03/24/2023, 11:10 AM

## 2023-03-25 DIAGNOSIS — A4151 Sepsis due to Escherichia coli [E. coli]: Secondary | ICD-10-CM

## 2023-03-25 DIAGNOSIS — G9341 Metabolic encephalopathy: Secondary | ICD-10-CM | POA: Diagnosis not present

## 2023-03-25 DIAGNOSIS — E872 Acidosis, unspecified: Secondary | ICD-10-CM | POA: Diagnosis not present

## 2023-03-25 DIAGNOSIS — N390 Urinary tract infection, site not specified: Secondary | ICD-10-CM | POA: Diagnosis not present

## 2023-03-25 DIAGNOSIS — R7881 Bacteremia: Secondary | ICD-10-CM

## 2023-03-25 LAB — CREATININE, SERUM
Creatinine, Ser: 0.47 mg/dL (ref 0.44–1.00)
GFR, Estimated: >60 mL/min (ref >60)

## 2023-03-25 MED ORDER — FERROUS SULFATE 325 (65 FE) MG PO TABS: 325.0000 mg | ORAL_TABLET | Freq: Every day | ORAL | Status: AC

## 2023-03-25 MED ORDER — TRAMADOL HCL 50 MG PO TABS: 50.0000 mg | ORAL_TABLET | Freq: Three times a day (TID) | ORAL | 0 refills | Status: AC | PRN

## 2023-03-25 NOTE — TOC Transition Note (Signed)
Transition of Care [facility]) - CM/SW Discharge Note   Patient Details  Name: Karen Best MRN: [id number] Date of Birth: Sep 20, 1955  [facility] CM/SW Contact:  Erin Sons, LCSW Phone Number: 03/25/2023, 2:29 PM   Clinical Narrative:     Per MD patient ready for DC to [facility]. CSW left voicemail with pt's legal guardian notifying of discharge. Discharge Summary and FL2 sent to facility. RN to call report prior to discharge [phone] ). DC packet on chart. Ambulance transport requested for patient.   CSW will sign off for now as social work intervention is no longer needed. Please consult [place] again if new needs arise.    Final next level of care: Skilled Nursing Facility Barriers to Discharge: No Barriers Identified   Patient Goals and CMS Choice   Choice offered to / list presented to : [facility] POA / Guardian  Discharge Placement                Patient chooses bed at: [facility] Patient to be transferred to facility by: [facility] Name of family member notified: Legal Guardian Patient and family notified of of transfer: 03/25/23  Discharge Plan and Services Additional resources added to the After Visit Summary for   In-house Referral: Clinical Social Work   Post Acute Care Choice: Nursing Home                               Social Determinants of Health (SDOH) Interventions SDOH Screenings   Food Insecurity: Patient Unable To Answer (03/19/2023)  Housing: Patient Unable To Answer (03/19/2023)  Transportation Needs: Patient Unable To Answer (03/19/2023)  Utilities: Patient Unable To Answer (03/19/2023)  Depression (PHQ2-9): Low Risk  (02/06/2022)  Tobacco Use: High Risk (03/17/2023)     Readmission Risk Interventions    02/25/2021    1:11 PM 09/25/2020    9:54 AM  Readmission Risk Prevention Plan  Transportation Screening Complete Complete  PCP or Specialist Appt within 5-7 Days  Complete  PCP or Specialist Appt within 3-5 Days  Not Complete   Not Complete comments Three attempts made to call [facility]--phone rings and rings but no answer, cannot leave mesage.   Home Care Screening  Complete  Medication Review (RN CM)  Complete  HRI or Home Care Consult Complete   Social Work Consult for Recovery Care Planning/Counseling Complete   Palliative Care Screening Not Applicable

## 2023-03-25 NOTE — Plan of Care (Signed)
Pt was less agitated tonight. Pt voiced discomfort due to bilateral foot pain, gave prn sch meds. Pt slept well after receiving meds. Monitored pt during night shift for any other signs or vocal complaints of pain. Pt progressing, tolerating antibiotics well.

## 2023-03-25 NOTE — Discharge Summary (Signed)
Physician Discharge Summary   Patient: Karen Best MRN: [id number] DOB: 06-16-1955  Admit date:     03/17/2023  Discharge date: 03/25/23  Discharge Physician: Onalee Hua Kendon Sedeno   PCP: [facility]   Recommendations at discharge:   Please follow up with primary care provider within 1-2 weeks  Please repeat BMP and CBC in one week     Hospital Course: 67 y.o. female with medical history significant of hyperlipidemia, constipation, schizoaffective disorder history of stroke who presents to the emergency department via EMS from SNF due to altered mental status.  Patient was unable to provide history due to altered mental status, history was obtained from ED physician and ED medical record.  Per report, she was noted to be less interactive yesterday, she was usually very conversational at baseline.  Patient was recently diagnosed to have UTI and received a first dose of ceftriaxone yesterday.  EMS was activated and patient was taken to the ED for further evaluation and management.  At bedside, patient was able to state her name Laskin), but was unable to provide further history.   In the emergency department, she was febrile with a temperature of 103F, but other vital signs were within normal range.  Workup in the ED showed leukocytosis and MCV of 76.2, BMP was normal except for sodium of 133 and blood glucose of 115.  Lactic acid 2.7 > 2.1, urinalysis was positive for UTI.  Blood culture collected.  Chest x-ray showed no acute abnormality.  Patient was treated with IV ceftriaxone, IV hydration was provided, Tylenol was given due to fever.  Hospitalist was asked to admit patient for further evaluation and management. She was initially started on ceftriaxone.  Once multiplex PCR from blood cultures returned suggesting ESBL Ecoli, patient was switched to meropenem.  She finished 7 days IV merrem and remain afebrile and hemodynamically stable.  Assessment and Plan: Severe  sepsis due to ESBL E coli UTI Patient was initially started on IV ceftriaxone and subsequently changed to meropenem when ESBL results were resulted  High fevers have defervesced Blood cultures positive for ESBL E coli  Sepsis physiology resolved    ESBL E coli Bacteremia  - continue IV meropenem  - planning to complete 7 days IV meropenem due to severe sepsis presentation - discussed with pharm D, meropenem to complete on Wed 11/27.  - plan to DC back to longterm care after meropenem completes on 11/27   Lactic acidosis - RESOLVED  Lactic acid 2.7 > 2.1, secondary to sepsis   Acute metabolic encephalopathy - RESOLVED  Secondary to severe sepsis due to UTI Continue management as described above for UTI Continue fall precaution Pt back to baseline and more alert and communicative   Low MCV - iron deficiency anemia MCV 76.2, iron studies ordered -serum iron 16, ferritin 102 -TIBC 241 -daily iron supplement ordered   Hyperlipidemia Continue statin   Schizoaffective disorder, bipolar type/tardive dyskinesia Continue  Klonopin, Depakote -no longer taking zyprexa and ingrezza        Consultants: none Procedures performed: none  Disposition: Skilled nursing facility Diet recommendation:  Dysphagia type 2 Thin Liquid DISCHARGE MEDICATION: Allergies as of 03/25/2023       Reactions   Haloperidol And Related    Extrapyramidal symptoms        Medication List     STOP taking these medications    cefTRIAXone 2 g injection Commonly known as: ROCEPHIN   hydrOXYzine 25 MG tablet Commonly known as:  ATARAX   OLANZapine 5 MG tablet Commonly known as: ZYPREXA   senna-docusate 8.6-50 MG tablet Commonly known as: Senokot-S       TAKE these medications    acetaminophen 325 MG tablet Commonly known as: TYLENOL Take 650 mg by mouth every 6 (six) hours as needed.   clonazePAM 0.5 MG tablet Commonly known as: KLONOPIN Take 0.5 mg by mouth at bedtime.    divalproex 125 MG capsule Commonly known as: DEPAKOTE SPRINKLE Take 500 mg by mouth 2 (two) times daily. What changed: Another medication with the same name was removed. Continue taking this medication, and follow the directions you see here.   ferrous sulfate 325 (65 FE) MG tablet Take 1 tablet (325 mg total) by mouth daily with breakfast. Start taking on: March 26, 2023   mirtazapine 7.5 MG tablet Commonly known as: REMERON Take 7.5 mg by mouth at bedtime.   polyethylene glycol 17 g packet Commonly known as: MIRALAX / GLYCOLAX Take 17 g by mouth daily as needed for mild constipation or moderate constipation.   polyethylene glycol 17 g packet Commonly known as: MiraLax Take 17 g by mouth daily.   rosuvastatin 10 MG tablet Commonly known as: CRESTOR Take 10 mg by mouth at bedtime.   traMADol 50 MG tablet Commonly known as: ULTRAM Take 1 tablet (50 mg total) by mouth 3 (three) times daily as needed.        Discharge Exam: Filed Weights   03/17/23 2022  Weight: 71 kg   HEENT:  [place]/AT, No thrush, no icterus CV:  RRR, no rub, no S3, no S4 Lung:  CTA, no wheeze, no rhonchi Abd:  soft/+BS, NT Ext:  No edema, no lymphangitis, no synovitis, no rash   Condition at discharge: stable  The results of significant diagnostics from this hospitalization (including imaging, microbiology, ancillary and laboratory) are listed below for reference.   Imaging Studies: DG Chest Portable 1 View  Result Date: 03/18/2023 CLINICAL DATA:  Possible pneumonia EXAM: PORTABLE CHEST 1 VIEW COMPARISON:  07/16/2022 FINDINGS: Cardiac shadow is within normal limits. The lungs are well aerated bilaterally. No focal infiltrate or effusion is seen. No bony abnormality is noted. IMPRESSION: No acute abnormality noted. Electronically Signed   By: Alcide Clever M.D.   On: 03/18/2023 00:03    Microbiology: Results for orders placed or performed during the hospital encounter of 03/17/23  Culture,  blood (Routine X 2) w Reflex to ID Panel     Status: Abnormal   Collection Time: 03/17/23  8:47 PM   Specimen: BLOOD  Result Value Ref Range Status   Specimen Description   Final    BLOOD RIGHT ANTECUBITAL Performed at [facility], [address], [place], [place] [address]    Special Requests   Final    BOTTLES DRAWN AEROBIC AND ANAEROBIC Blood Culture results may not be optimal due to an excessive volume of blood received in culture bottles Performed at [facility], [address] [place], [place] [address]    Culture  Setup Time   Final    GRAM NEGATIVE RODS ANAEROBIC BOTTLE ONLY Gram Stain Report Called to,Read Back By and Verified With: SURLES M. AT 1333 ON [id number] BY THOMPSON S. GRAM STAIN REVIEWED-AGREE WITH RESULT DRT CRITICAL RESULT CALLED TO, READ BACK BY AND VERIFIED WITH: RN MARN FLORES ON 03/18/23 @ 1759 BY DRT AEROBIC BOTTLE ONLY GRAM NEGATIVE RODS GRAM STAIN REVIEWED-AGREE WITH RESULT FH Performed at [facility], [address] [place],  [place] [address]    Culture (A)  Final    ESCHERICHIA COLI Confirmed Extended Spectrum Beta-Lactamase Producer (ESBL).  In bloodstream infections from ESBL organisms, carbapenems are preferred over piperacillin/tazobactam. They are shown to have a lower risk of mortality.    Report Status 03/20/2023 FINAL  Final   Organism ID, Bacteria ESCHERICHIA COLI  Final   Organism ID, Bacteria ESCHERICHIA COLI  Final      Susceptibility   Escherichia coli - KIRBY BAUER*    CEFAZOLIN RESISTANT Resistant    Escherichia coli - MIC*    AMPICILLIN >=32 RESISTANT Resistant     CEFEPIME >=32 RESISTANT Resistant     CEFTAZIDIME >=64 RESISTANT Resistant     CEFTRIAXONE >=64 RESISTANT Resistant     CIPROFLOXACIN >=4 RESISTANT Resistant     GENTAMICIN <=1 SENSITIVE Sensitive     IMIPENEM <=0.25 SENSITIVE Sensitive     TRIMETH/SULFA >=320 RESISTANT Resistant     AMPICILLIN/SULBACTAM >=32 RESISTANT Resistant     PIP/TAZO 16 SENSITIVE  Sensitive ug/mL    * ESCHERICHIA COLI    ESCHERICHIA COLI  Blood Culture ID Panel (Reflexed)     Status: Abnormal   Collection Time: 03/17/23  8:47 PM  Result Value Ref Range Status   Enterococcus faecalis NOT DETECTED NOT DETECTED Final   Enterococcus Faecium NOT DETECTED NOT DETECTED Final   Listeria monocytogenes NOT DETECTED NOT DETECTED Final   Staphylococcus species NOT DETECTED NOT DETECTED Final   Staphylococcus aureus (BCID) NOT DETECTED NOT DETECTED Final   Staphylococcus epidermidis NOT DETECTED NOT DETECTED Final   Staphylococcus lugdunensis NOT DETECTED NOT DETECTED Final   Streptococcus species NOT DETECTED NOT DETECTED Final   Streptococcus agalactiae NOT DETECTED NOT DETECTED Final   Streptococcus pneumoniae NOT DETECTED NOT DETECTED Final   Streptococcus pyogenes NOT DETECTED NOT DETECTED Final   A.calcoaceticus-baumannii NOT DETECTED NOT DETECTED Final   Bacteroides fragilis NOT DETECTED NOT DETECTED Final   Enterobacterales DETECTED (A) NOT DETECTED Final    Comment: Enterobacterales represent a large order of gram negative bacteria, not a single organism. CRITICAL RESULT CALLED TO, READ BACK BY AND VERIFIED WITH: RN MARN FLORES ON 03/18/23 @ 1759 BY DRT    Enterobacter cloacae complex NOT DETECTED NOT DETECTED Final   Escherichia coli DETECTED (A) NOT DETECTED Final    Comment: CRITICAL RESULT CALLED TO, READ BACK BY AND VERIFIED WITH: RN MARN FLORES ON 03/18/23 @ 1759 BY DRT    Klebsiella aerogenes NOT DETECTED NOT DETECTED Final   Klebsiella oxytoca NOT DETECTED NOT DETECTED Final   Klebsiella pneumoniae NOT DETECTED NOT DETECTED Final   Proteus species NOT DETECTED NOT DETECTED Final   Salmonella species NOT DETECTED NOT DETECTED Final   Serratia marcescens NOT DETECTED NOT DETECTED Final   Haemophilus influenzae NOT DETECTED NOT DETECTED Final   Neisseria meningitidis NOT DETECTED NOT DETECTED Final   Pseudomonas aeruginosa NOT DETECTED NOT DETECTED Final    Stenotrophomonas maltophilia NOT DETECTED NOT DETECTED Final   Candida albicans NOT DETECTED NOT DETECTED Final   Candida auris NOT DETECTED NOT DETECTED Final   Candida glabrata NOT DETECTED NOT DETECTED Final   Candida krusei NOT DETECTED NOT DETECTED Final   Candida parapsilosis NOT DETECTED NOT DETECTED Final   Candida tropicalis NOT DETECTED NOT DETECTED Final   Cryptococcus neoformans/gattii NOT DETECTED NOT DETECTED Final   CTX-M ESBL DETECTED (A) NOT DETECTED Final    Comment: CRITICAL RESULT CALLED TO, READ BACK BY AND VERIFIED WITH: RN MARN FLORES ON 03/18/23 @  1759 BY DRT (NOTE) Extended spectrum beta-lactamase detected. Recommend a carbapenem as initial therapy.      Carbapenem resistance IMP NOT DETECTED NOT DETECTED Final   Carbapenem resistance KPC NOT DETECTED NOT DETECTED Final   Carbapenem resistance NDM NOT DETECTED NOT DETECTED Final   Carbapenem resist OXA 48 LIKE NOT DETECTED NOT DETECTED Final   Carbapenem resistance VIM NOT DETECTED NOT DETECTED Final    Comment: Performed at [facility], [address], [place], [place] [address]  Culture, blood (Routine X 2) w Reflex to ID Panel     Status: None   Collection Time: 03/17/23 10:26 PM   Specimen: BLOOD  Result Value Ref Range Status   Specimen Description BLOOD LEFT ANTECUBITAL  Final   Special Requests   Final    BOTTLES DRAWN AEROBIC AND ANAEROBIC Blood Culture results may not be optimal due to an excessive volume of blood received in culture bottles   Culture   Final    NO GROWTH 5 DAYS Performed at [facility], [address], [place], [place] [address]    Report Status 03/23/2023 FINAL  Final  Urine Culture     Status: Abnormal   Collection Time: 03/18/23  8:30 AM   Specimen: Urine, Clean Catch  Result Value Ref Range Status   Specimen Description   Final    URINE, CLEAN CATCH Performed at [facility], [address] [place], [place] [address]    Special Requests   Final     NONE Reflexed from [id number] Performed at [facility], [address] [place], [place] [address]    Culture (A)  Final    30,000 COLONIES/mL ESCHERICHIA COLI Confirmed Extended Spectrum Beta-Lactamase Producer (ESBL).  In bloodstream infections from ESBL organisms, carbapenems are preferred over piperacillin/tazobactam. They are shown to have a lower risk of mortality.    Report Status 03/20/2023 FINAL  Final   Organism ID, Bacteria ESCHERICHIA COLI (A)  Final      Susceptibility   Escherichia coli - MIC*    AMPICILLIN >=32 RESISTANT Resistant     CEFAZOLIN >=64 RESISTANT Resistant     CEFEPIME >=32 RESISTANT Resistant     CEFTRIAXONE >=64 RESISTANT Resistant     CIPROFLOXACIN >=4 RESISTANT Resistant     GENTAMICIN <=1 SENSITIVE Sensitive     IMIPENEM <=0.25 SENSITIVE Sensitive     NITROFURANTOIN <=16 SENSITIVE Sensitive     TRIMETH/SULFA >=320 RESISTANT Resistant     AMPICILLIN/SULBACTAM >=32 RESISTANT Resistant     PIP/TAZO 8 SENSITIVE Sensitive ug/mL    * 30,000 COLONIES/mL ESCHERICHIA COLI    Labs: CBC: Recent Labs  Lab 03/19/23 0417 03/20/23 0422  WBC 12.7* 8.2  NEUTROABS 8.6* 4.1  HGB 11.0* 11.7*  HCT 35.6* 38.0  MCV 76.6* 76.5*  PLT 130* 158   Basic Metabolic Panel: Recent Labs  Lab 03/19/23 0417 03/20/23 0422 03/25/23 0410  NA 137 137  --   K 4.2 3.6  --   CL 105 107  --   CO2 24 22  --   GLUCOSE 88 98  --   BUN 18 22  --   CREATININE 0.57 0.54 0.47  CALCIUM 9.3 9.2  --    Liver Function Tests: No results for input(s): "AST", "ALT", "ALKPHOS", "BILITOT", "PROT", "ALBUMIN" in the last 168 hours. CBG: No results for input(s): "GLUCAP" in the last 168 hours.  Discharge time spent: greater than 30 minutes.  Signed: Catarina Hartshorn, MD [facility] 03/25/2023

## 2023-03-25 NOTE — Care Management Important Message (Signed)
Important Message  Patient Details  Name: Karen Best MRN: [id number] Date of Birth: 08-Mar-1956   Important Message Given:  Other (see comment) (unable to reach guardian, letter mailed to address on file)     Corey Harold 03/25/2023, 11:42 AM
